# Patient Record
Sex: Female | Born: 1935
Health system: Southern US, Community
[De-identification: ages and names within clinical notes are randomized; demographics above are authoritative.]

## PROBLEM LIST (undated history)

## (undated) DIAGNOSIS — I319 Disease of pericardium, unspecified: Secondary | ICD-10-CM

## (undated) DIAGNOSIS — J90 Pleural effusion, not elsewhere classified: Secondary | ICD-10-CM

## (undated) DIAGNOSIS — D649 Anemia, unspecified: Secondary | ICD-10-CM

## (undated) DIAGNOSIS — K649 Unspecified hemorrhoids: Secondary | ICD-10-CM

## (undated) DIAGNOSIS — R918 Other nonspecific abnormal finding of lung field: Secondary | ICD-10-CM

## (undated) DIAGNOSIS — Z8719 Personal history of other diseases of the digestive system: Secondary | ICD-10-CM

## (undated) DIAGNOSIS — I4891 Unspecified atrial fibrillation: Secondary | ICD-10-CM

## (undated) DIAGNOSIS — C8590 Non-Hodgkin lymphoma, unspecified, unspecified site: Secondary | ICD-10-CM

## (undated) DIAGNOSIS — I499 Cardiac arrhythmia, unspecified: Secondary | ICD-10-CM

## (undated) DIAGNOSIS — K219 Gastro-esophageal reflux disease without esophagitis: Secondary | ICD-10-CM

## (undated) DIAGNOSIS — D839 Common variable immunodeficiency, unspecified: Secondary | ICD-10-CM

## (undated) DIAGNOSIS — I8390 Asymptomatic varicose veins of unspecified lower extremity: Secondary | ICD-10-CM

## (undated) DIAGNOSIS — H269 Unspecified cataract: Secondary | ICD-10-CM

## (undated) DIAGNOSIS — M419 Scoliosis, unspecified: Secondary | ICD-10-CM

## (undated) DIAGNOSIS — M1990 Unspecified osteoarthritis, unspecified site: Secondary | ICD-10-CM

## (undated) DIAGNOSIS — E039 Hypothyroidism, unspecified: Secondary | ICD-10-CM

## (undated) DIAGNOSIS — IMO0002 Reserved for concepts with insufficient information to code with codable children: Secondary | ICD-10-CM

## (undated) DIAGNOSIS — I10 Essential (primary) hypertension: Secondary | ICD-10-CM

## (undated) DIAGNOSIS — Z950 Presence of cardiac pacemaker: Secondary | ICD-10-CM

## (undated) HISTORY — DX: Essential (primary) hypertension: I10

## (undated) HISTORY — PX: TONSILLECTOMY: SUR1361

## (undated) HISTORY — PX: CATARACT EXTRACTION: SUR2

## (undated) HISTORY — DX: Other nonspecific abnormal finding of lung field: R91.8

## (undated) HISTORY — DX: Disease of pericardium, unspecified: I31.9

## (undated) HISTORY — DX: Anemia, unspecified: D64.9

## (undated) HISTORY — DX: Scoliosis, unspecified: M41.9

## (undated) HISTORY — DX: Asymptomatic varicose veins of unspecified lower extremity: I83.90

## (undated) HISTORY — DX: Personal history of other diseases of the digestive system: Z87.19

## (undated) HISTORY — DX: Hypothyroidism, unspecified: E03.9

## (undated) HISTORY — DX: Reserved for concepts with insufficient information to code with codable children: IMO0002

## (undated) HISTORY — DX: Pleural effusion, not elsewhere classified: J90

## (undated) HISTORY — PX: COLONOSCOPY: SHX174

## (undated) HISTORY — DX: Unspecified atrial fibrillation: I48.91

---

## 1998-04-22 ENCOUNTER — Other Ambulatory Visit: Admission: RE | Admit: 1998-04-22 | Discharge: 1998-04-22 | Payer: Self-pay | Admitting: Obstetrics and Gynecology

## 1998-05-09 ENCOUNTER — Other Ambulatory Visit: Admission: RE | Admit: 1998-05-09 | Discharge: 1998-05-09 | Payer: Self-pay | Admitting: Obstetrics and Gynecology

## 1998-06-10 ENCOUNTER — Ambulatory Visit (HOSPITAL_COMMUNITY): Admission: RE | Admit: 1998-06-10 | Discharge: 1998-06-10 | Payer: Self-pay | Admitting: Obstetrics and Gynecology

## 1998-06-13 ENCOUNTER — Ambulatory Visit (HOSPITAL_BASED_OUTPATIENT_CLINIC_OR_DEPARTMENT_OTHER): Admission: RE | Admit: 1998-06-13 | Discharge: 1998-06-13 | Payer: Self-pay

## 1999-06-12 ENCOUNTER — Encounter: Payer: Self-pay | Admitting: Obstetrics and Gynecology

## 1999-06-12 ENCOUNTER — Ambulatory Visit (HOSPITAL_COMMUNITY): Admission: RE | Admit: 1999-06-12 | Discharge: 1999-06-12 | Payer: Self-pay | Admitting: Obstetrics and Gynecology

## 1999-11-04 ENCOUNTER — Other Ambulatory Visit: Admission: RE | Admit: 1999-11-04 | Discharge: 1999-11-04 | Payer: Self-pay | Admitting: Obstetrics and Gynecology

## 1999-12-24 ENCOUNTER — Other Ambulatory Visit: Admission: RE | Admit: 1999-12-24 | Discharge: 1999-12-24 | Payer: Self-pay | Admitting: Obstetrics and Gynecology

## 2001-07-30 HISTORY — PX: BREAST SURGERY: SHX581

## 2001-11-03 ENCOUNTER — Other Ambulatory Visit: Admission: RE | Admit: 2001-11-03 | Discharge: 2001-11-03 | Payer: Self-pay | Admitting: Family Medicine

## 2002-10-10 ENCOUNTER — Other Ambulatory Visit: Admission: RE | Admit: 2002-10-10 | Discharge: 2002-10-10 | Payer: Self-pay | Admitting: Obstetrics and Gynecology

## 2002-11-03 ENCOUNTER — Inpatient Hospital Stay (HOSPITAL_COMMUNITY): Admission: RE | Admit: 2002-11-03 | Discharge: 2002-11-04 | Payer: Self-pay | Admitting: Obstetrics and Gynecology

## 2002-11-03 ENCOUNTER — Encounter (INDEPENDENT_AMBULATORY_CARE_PROVIDER_SITE_OTHER): Payer: Self-pay | Admitting: *Deleted

## 2004-01-28 ENCOUNTER — Encounter (INDEPENDENT_AMBULATORY_CARE_PROVIDER_SITE_OTHER): Payer: Self-pay | Admitting: *Deleted

## 2004-04-08 ENCOUNTER — Ambulatory Visit: Payer: Self-pay | Admitting: Sports Medicine

## 2004-04-29 ENCOUNTER — Ambulatory Visit: Payer: Self-pay | Admitting: Sports Medicine

## 2004-04-29 HISTORY — PX: PARTIAL HYSTERECTOMY: SHX80

## 2005-06-15 ENCOUNTER — Emergency Department (HOSPITAL_COMMUNITY): Admission: EM | Admit: 2005-06-15 | Discharge: 2005-06-16 | Payer: Self-pay | Admitting: Emergency Medicine

## 2005-06-15 ENCOUNTER — Emergency Department (HOSPITAL_COMMUNITY): Admission: EM | Admit: 2005-06-15 | Discharge: 2005-06-15 | Payer: Self-pay | Admitting: Emergency Medicine

## 2005-06-17 ENCOUNTER — Ambulatory Visit: Payer: Self-pay | Admitting: Internal Medicine

## 2005-06-17 ENCOUNTER — Inpatient Hospital Stay (HOSPITAL_COMMUNITY): Admission: EM | Admit: 2005-06-17 | Discharge: 2005-06-23 | Payer: Self-pay | Admitting: Emergency Medicine

## 2005-06-21 ENCOUNTER — Encounter (INDEPENDENT_AMBULATORY_CARE_PROVIDER_SITE_OTHER): Payer: Self-pay | Admitting: Specialist

## 2005-06-23 ENCOUNTER — Encounter (INDEPENDENT_AMBULATORY_CARE_PROVIDER_SITE_OTHER): Payer: Self-pay | Admitting: Cardiology

## 2005-06-26 ENCOUNTER — Inpatient Hospital Stay (HOSPITAL_COMMUNITY): Admission: EM | Admit: 2005-06-26 | Discharge: 2005-06-30 | Payer: Self-pay | Admitting: Emergency Medicine

## 2005-06-27 ENCOUNTER — Encounter (INDEPENDENT_AMBULATORY_CARE_PROVIDER_SITE_OTHER): Payer: Self-pay | Admitting: Specialist

## 2005-06-27 ENCOUNTER — Ambulatory Visit: Payer: Self-pay | Admitting: Critical Care Medicine

## 2005-07-07 ENCOUNTER — Ambulatory Visit: Payer: Self-pay | Admitting: Internal Medicine

## 2005-07-17 ENCOUNTER — Ambulatory Visit: Payer: Self-pay | Admitting: Internal Medicine

## 2005-08-20 ENCOUNTER — Ambulatory Visit (HOSPITAL_BASED_OUTPATIENT_CLINIC_OR_DEPARTMENT_OTHER): Admission: RE | Admit: 2005-08-20 | Discharge: 2005-08-20 | Payer: Self-pay | Admitting: Orthopedic Surgery

## 2005-08-20 ENCOUNTER — Encounter (INDEPENDENT_AMBULATORY_CARE_PROVIDER_SITE_OTHER): Payer: Self-pay | Admitting: Specialist

## 2006-08-27 ENCOUNTER — Encounter (INDEPENDENT_AMBULATORY_CARE_PROVIDER_SITE_OTHER): Payer: Self-pay | Admitting: *Deleted

## 2006-12-23 ENCOUNTER — Encounter: Admission: RE | Admit: 2006-12-23 | Discharge: 2006-12-23 | Payer: Self-pay | Admitting: Gastroenterology

## 2007-01-31 ENCOUNTER — Encounter: Admission: RE | Admit: 2007-01-31 | Discharge: 2007-01-31 | Payer: Self-pay | Admitting: Surgery

## 2007-11-15 LAB — HM COLONOSCOPY: HM Colonoscopy: NORMAL

## 2007-12-19 ENCOUNTER — Encounter: Admission: RE | Admit: 2007-12-19 | Discharge: 2007-12-19 | Payer: Self-pay | Admitting: Sports Medicine

## 2008-04-09 ENCOUNTER — Ambulatory Visit: Payer: Self-pay | Admitting: Vascular Surgery

## 2008-04-12 ENCOUNTER — Ambulatory Visit: Payer: Self-pay | Admitting: Vascular Surgery

## 2008-10-30 ENCOUNTER — Ambulatory Visit: Payer: Self-pay | Admitting: Internal Medicine

## 2008-10-30 ENCOUNTER — Ambulatory Visit: Payer: Self-pay | Admitting: Diagnostic Radiology

## 2008-10-30 ENCOUNTER — Ambulatory Visit (HOSPITAL_BASED_OUTPATIENT_CLINIC_OR_DEPARTMENT_OTHER): Admission: RE | Admit: 2008-10-30 | Discharge: 2008-10-30 | Payer: Self-pay | Admitting: Internal Medicine

## 2008-10-30 DIAGNOSIS — K219 Gastro-esophageal reflux disease without esophagitis: Secondary | ICD-10-CM

## 2008-10-30 DIAGNOSIS — R0609 Other forms of dyspnea: Secondary | ICD-10-CM

## 2008-10-30 DIAGNOSIS — E039 Hypothyroidism, unspecified: Secondary | ICD-10-CM

## 2008-10-30 DIAGNOSIS — E041 Nontoxic single thyroid nodule: Secondary | ICD-10-CM | POA: Insufficient documentation

## 2008-10-30 DIAGNOSIS — R0989 Other specified symptoms and signs involving the circulatory and respiratory systems: Secondary | ICD-10-CM

## 2008-10-30 DIAGNOSIS — F4321 Adjustment disorder with depressed mood: Secondary | ICD-10-CM | POA: Insufficient documentation

## 2008-10-30 LAB — CONVERTED CEMR LAB
ALT: 17 units/L (ref 0–35)
Basophils Relative: 0.9 % (ref 0.0–3.0)
Bilirubin, Direct: 0.1 mg/dL (ref 0.0–0.3)
CO2: 30 meq/L (ref 19–32)
Eosinophils Absolute: 0.4 10*3/uL (ref 0.0–0.7)
Glucose, Bld: 87 mg/dL (ref 70–99)
HCT: 36.1 % (ref 36.0–46.0)
Hemoglobin: 12.7 g/dL (ref 12.0–15.0)
MCHC: 35.2 g/dL (ref 30.0–36.0)
MCV: 92.7 fL (ref 78.0–100.0)
Monocytes Absolute: 0.5 10*3/uL (ref 0.1–1.0)
Neutro Abs: 2.3 10*3/uL (ref 1.4–7.7)
Potassium: 4.6 meq/L (ref 3.5–5.1)
RBC: 3.9 M/uL (ref 3.87–5.11)
Sodium: 144 meq/L (ref 135–145)
Total Protein: 6.7 g/dL (ref 6.0–8.3)

## 2008-11-01 ENCOUNTER — Encounter: Payer: Self-pay | Admitting: Internal Medicine

## 2008-11-02 ENCOUNTER — Ambulatory Visit (HOSPITAL_BASED_OUTPATIENT_CLINIC_OR_DEPARTMENT_OTHER): Admission: RE | Admit: 2008-11-02 | Discharge: 2008-11-02 | Payer: Self-pay | Admitting: Internal Medicine

## 2008-11-02 ENCOUNTER — Ambulatory Visit: Payer: Self-pay | Admitting: Diagnostic Radiology

## 2008-11-05 ENCOUNTER — Telehealth: Payer: Self-pay | Admitting: Internal Medicine

## 2008-11-21 ENCOUNTER — Ambulatory Visit: Payer: Self-pay | Admitting: Internal Medicine

## 2008-11-21 ENCOUNTER — Encounter: Payer: Self-pay | Admitting: Internal Medicine

## 2008-11-21 DIAGNOSIS — B86 Scabies: Secondary | ICD-10-CM | POA: Insufficient documentation

## 2008-11-29 ENCOUNTER — Ambulatory Visit: Payer: Self-pay | Admitting: Critical Care Medicine

## 2008-11-30 ENCOUNTER — Ambulatory Visit: Payer: Self-pay | Admitting: Internal Medicine

## 2008-12-02 ENCOUNTER — Encounter: Payer: Self-pay | Admitting: Critical Care Medicine

## 2008-12-03 ENCOUNTER — Telehealth: Payer: Self-pay | Admitting: Internal Medicine

## 2008-12-06 ENCOUNTER — Telehealth: Payer: Self-pay | Admitting: Internal Medicine

## 2008-12-07 ENCOUNTER — Ambulatory Visit: Payer: Self-pay | Admitting: Radiology

## 2008-12-07 ENCOUNTER — Ambulatory Visit: Payer: Self-pay | Admitting: Internal Medicine

## 2008-12-07 ENCOUNTER — Telehealth: Payer: Self-pay | Admitting: Internal Medicine

## 2008-12-07 ENCOUNTER — Ambulatory Visit (HOSPITAL_BASED_OUTPATIENT_CLINIC_OR_DEPARTMENT_OTHER): Admission: RE | Admit: 2008-12-07 | Discharge: 2008-12-07 | Payer: Self-pay | Admitting: Internal Medicine

## 2008-12-07 DIAGNOSIS — M549 Dorsalgia, unspecified: Secondary | ICD-10-CM | POA: Insufficient documentation

## 2008-12-09 ENCOUNTER — Telehealth: Payer: Self-pay | Admitting: Internal Medicine

## 2008-12-11 ENCOUNTER — Telehealth: Payer: Self-pay | Admitting: Internal Medicine

## 2008-12-21 ENCOUNTER — Telehealth: Payer: Self-pay | Admitting: Internal Medicine

## 2008-12-24 ENCOUNTER — Ambulatory Visit: Payer: Self-pay | Admitting: Internal Medicine

## 2008-12-24 DIAGNOSIS — N390 Urinary tract infection, site not specified: Secondary | ICD-10-CM

## 2008-12-24 LAB — CONVERTED CEMR LAB
Bilirubin Urine: NEGATIVE
Glucose, Urine, Semiquant: NEGATIVE
Urobilinogen, UA: 0.2

## 2008-12-31 ENCOUNTER — Telehealth: Payer: Self-pay | Admitting: Family Medicine

## 2009-01-08 ENCOUNTER — Ambulatory Visit: Payer: Self-pay | Admitting: Internal Medicine

## 2009-01-08 ENCOUNTER — Encounter: Payer: Self-pay | Admitting: Family Medicine

## 2009-01-10 LAB — CONVERTED CEMR LAB: TSH: 2.003 microintl units/mL (ref 0.350–4.500)

## 2009-01-11 ENCOUNTER — Encounter: Payer: Self-pay | Admitting: Critical Care Medicine

## 2009-01-11 ENCOUNTER — Ambulatory Visit (HOSPITAL_COMMUNITY): Admission: RE | Admit: 2009-01-11 | Discharge: 2009-01-11 | Payer: Self-pay | Admitting: Critical Care Medicine

## 2009-01-17 ENCOUNTER — Telehealth: Payer: Self-pay | Admitting: Family Medicine

## 2009-01-17 ENCOUNTER — Ambulatory Visit: Payer: Self-pay | Admitting: Critical Care Medicine

## 2009-01-30 ENCOUNTER — Ambulatory Visit: Payer: Self-pay | Admitting: Internal Medicine

## 2009-01-30 ENCOUNTER — Telehealth: Payer: Self-pay | Admitting: Internal Medicine

## 2009-01-30 LAB — CONVERTED CEMR LAB
Bilirubin Urine: NEGATIVE
Urine Glucose: 100 mg/dL
pH: 5 (ref 5.0–8.0)

## 2009-01-31 ENCOUNTER — Encounter: Payer: Self-pay | Admitting: Internal Medicine

## 2009-02-06 ENCOUNTER — Telehealth: Payer: Self-pay | Admitting: Internal Medicine

## 2009-02-26 ENCOUNTER — Encounter: Payer: Self-pay | Admitting: Internal Medicine

## 2009-02-27 ENCOUNTER — Encounter: Payer: Self-pay | Admitting: Internal Medicine

## 2009-02-27 LAB — HM MAMMOGRAPHY: HM Mammogram: NORMAL

## 2009-03-11 ENCOUNTER — Telehealth: Payer: Self-pay | Admitting: Internal Medicine

## 2009-03-18 ENCOUNTER — Encounter: Payer: Self-pay | Admitting: Internal Medicine

## 2009-03-21 ENCOUNTER — Encounter: Payer: Self-pay | Admitting: Internal Medicine

## 2009-04-05 ENCOUNTER — Ambulatory Visit (HOSPITAL_COMMUNITY): Admission: RE | Admit: 2009-04-05 | Discharge: 2009-04-05 | Payer: Self-pay | Admitting: Obstetrics and Gynecology

## 2009-06-13 ENCOUNTER — Encounter: Payer: Self-pay | Admitting: Internal Medicine

## 2009-06-13 LAB — CONVERTED CEMR LAB

## 2009-06-17 ENCOUNTER — Telehealth: Payer: Self-pay | Admitting: Internal Medicine

## 2009-06-18 ENCOUNTER — Ambulatory Visit (HOSPITAL_BASED_OUTPATIENT_CLINIC_OR_DEPARTMENT_OTHER): Admission: RE | Admit: 2009-06-18 | Discharge: 2009-06-18 | Payer: Self-pay | Admitting: Internal Medicine

## 2009-06-18 ENCOUNTER — Ambulatory Visit: Payer: Self-pay | Admitting: Diagnostic Radiology

## 2009-06-21 ENCOUNTER — Telehealth: Payer: Self-pay | Admitting: Internal Medicine

## 2009-06-24 ENCOUNTER — Encounter (INDEPENDENT_AMBULATORY_CARE_PROVIDER_SITE_OTHER): Payer: Self-pay | Admitting: *Deleted

## 2009-07-02 ENCOUNTER — Encounter: Payer: Self-pay | Admitting: Internal Medicine

## 2009-07-04 ENCOUNTER — Ambulatory Visit: Payer: Self-pay | Admitting: Internal Medicine

## 2009-07-04 ENCOUNTER — Encounter (INDEPENDENT_AMBULATORY_CARE_PROVIDER_SITE_OTHER): Payer: Self-pay | Admitting: *Deleted

## 2009-07-04 DIAGNOSIS — R42 Dizziness and giddiness: Secondary | ICD-10-CM

## 2009-07-04 DIAGNOSIS — R87619 Unspecified abnormal cytological findings in specimens from cervix uteri: Secondary | ICD-10-CM | POA: Insufficient documentation

## 2009-07-04 LAB — CONVERTED CEMR LAB
Cholesterol, target level: 200 mg/dL
LDL Goal: 160 mg/dL

## 2009-07-16 ENCOUNTER — Ambulatory Visit: Payer: Self-pay | Admitting: Cardiology

## 2009-07-16 ENCOUNTER — Encounter: Payer: Self-pay | Admitting: Internal Medicine

## 2009-07-16 ENCOUNTER — Ambulatory Visit (HOSPITAL_COMMUNITY): Admission: RE | Admit: 2009-07-16 | Discharge: 2009-07-16 | Payer: Self-pay | Admitting: Internal Medicine

## 2009-07-16 ENCOUNTER — Ambulatory Visit: Payer: Self-pay

## 2009-08-05 ENCOUNTER — Telehealth: Payer: Self-pay | Admitting: Internal Medicine

## 2009-08-13 ENCOUNTER — Ambulatory Visit: Payer: Self-pay | Admitting: Internal Medicine

## 2009-08-13 DIAGNOSIS — I519 Heart disease, unspecified: Secondary | ICD-10-CM | POA: Insufficient documentation

## 2009-09-18 ENCOUNTER — Ambulatory Visit: Payer: Self-pay | Admitting: Internal Medicine

## 2009-09-18 LAB — CONVERTED CEMR LAB
BUN: 19 mg/dL (ref 6–23)
Calcium: 9.9 mg/dL (ref 8.4–10.5)
Creatinine, Ser: 0.85 mg/dL (ref 0.40–1.20)
Pro B Natriuretic peptide (BNP): 12.3 pg/mL (ref 0.0–100.0)

## 2009-09-20 ENCOUNTER — Ambulatory Visit: Payer: Self-pay | Admitting: Internal Medicine

## 2009-09-20 DIAGNOSIS — M810 Age-related osteoporosis without current pathological fracture: Secondary | ICD-10-CM

## 2009-09-20 DIAGNOSIS — R490 Dysphonia: Secondary | ICD-10-CM

## 2009-11-27 ENCOUNTER — Ambulatory Visit: Admission: RE | Admit: 2009-11-27 | Discharge: 2009-11-27 | Payer: Self-pay | Admitting: Gynecologic Oncology

## 2009-12-16 ENCOUNTER — Ambulatory Visit (HOSPITAL_BASED_OUTPATIENT_CLINIC_OR_DEPARTMENT_OTHER): Admission: RE | Admit: 2009-12-16 | Discharge: 2009-12-16 | Payer: Self-pay | Admitting: Internal Medicine

## 2009-12-16 ENCOUNTER — Ambulatory Visit: Payer: Self-pay | Admitting: Diagnostic Radiology

## 2009-12-17 ENCOUNTER — Telehealth: Payer: Self-pay | Admitting: Internal Medicine

## 2010-02-18 LAB — CONVERTED CEMR LAB: Pap Smear: ABNORMAL

## 2010-02-26 ENCOUNTER — Ambulatory Visit: Admission: RE | Admit: 2010-02-26 | Discharge: 2010-02-26 | Payer: Self-pay | Admitting: Gynecologic Oncology

## 2010-02-26 ENCOUNTER — Other Ambulatory Visit: Admission: RE | Admit: 2010-02-26 | Discharge: 2010-02-26 | Payer: Self-pay | Admitting: Gynecologic Oncology

## 2010-03-25 ENCOUNTER — Ambulatory Visit: Payer: Self-pay | Admitting: Internal Medicine

## 2010-03-25 LAB — CONVERTED CEMR LAB
BUN: 10 mg/dL (ref 6–23)
Calcium: 10.3 mg/dL (ref 8.4–10.5)
Creatinine, Ser: 0.65 mg/dL (ref 0.40–1.20)

## 2010-03-26 ENCOUNTER — Telehealth: Payer: Self-pay | Admitting: Internal Medicine

## 2010-03-27 ENCOUNTER — Telehealth: Payer: Self-pay | Admitting: Internal Medicine

## 2010-03-28 ENCOUNTER — Telehealth: Payer: Self-pay | Admitting: Internal Medicine

## 2010-04-22 ENCOUNTER — Telehealth: Payer: Self-pay | Admitting: Internal Medicine

## 2010-05-08 ENCOUNTER — Ambulatory Visit: Payer: Self-pay | Admitting: Internal Medicine

## 2010-07-19 ENCOUNTER — Encounter: Payer: Self-pay | Admitting: Emergency Medicine

## 2010-07-29 NOTE — Letter (Signed)
Summary: Primary Care Consult Scheduled Letter  [place] [facility]  [address]   [place], [place] [address]   Phone: [phone]  Fax: [phone]      07/04/2009 MRN: [id number]  [name] [address] [place], [place]  [address]    Dear Ms. Starke,      We have scheduled an appointment for you.  At the recommendation of Dr.YOO_, we have scheduled you a   2D Echo,  @ [facility] on January 18 at 2:00pm.  Their address [address] [place], [place] . The office phone number is [phone].  If this appointment day and time is not convenient for you, please feel free to call the office of the doctor you are being referred to at the number listed above and reschedule the appointment.     It is important for you to keep your scheduled appointments. We are here to make sure you are given good patient care. If you have questions or you have made changes to your appointment, please notify us at  [phone], ask for [facility].    Thank you,  Patient Care Coordinator [place] at [facility]

## 2010-07-29 NOTE — Progress Notes (Signed)
Summary: Levothyroxine Refill  Phone Note Refill Request Message from:  Fax from Pharmacy on April 22, 2010 9:03 AM  Refills Requested: Medication #1:  LEVOTHYROXINE SODIUM 100 MCG TABS one by mouth once daily   Dosage confirmed as above?Dosage Confirmed   Brand Name Necessary? No   Supply Requested: 1 month   Last Refilled: 03/17/2010  Method Requested: Electronic Next Appointment Scheduled: NONE Initial call taken by: Roselle Locus,  April 22, 2010 9:03 AM  Follow-up for Phone Call        Rx completed in Dr. Tiajuana Amass Follow-up by: Glendell Docker CMA,  April 22, 2010 9:16 AM    Prescriptions: LEVOTHYROXINE SODIUM 100 MCG TABS (LEVOTHYROXINE SODIUM) one by mouth once daily  #30 x 3   Entered by:   Glendell Docker CMA   Authorized by:   D. Thomos Lemons DO   Signed by:   Glendell Docker CMA on 04/22/2010   Method used:   Electronically to        [facility] [phone]* (retail)       [address]       [place], [place]  [address]       Ph: [phone]       Fax: [phone]   RxID:   [id number]

## 2010-07-29 NOTE — Assessment & Plan Note (Signed)
Summary: discuss abnormal pap smear done at her gyn/hea   Vital Signs:  Patient profile:   76 year old female Weight:      141.50 pounds BMI:     24.38 O2 Sat:      100 % on Room air Temp:     97.6 degrees F oral Pulse rate:   59 / minute Pulse rhythm:   regular Resp:     18 per minute BP sitting:   110 / 80  (right arm) Cuff size:   regular  Vitals Entered By: Glendell Docker CMA (July 04, 2009 10:16 AM)  O2 Flow:  Room air   Primary Care Provider:  D. Thomos Lemons DO  CC:  follow up.  History of Present Illness:  76 y/o female c/o intermittent dizziness.  no chest pain but c/o shortness of breath.  symptoms seem to worse in afternoon.  prev pulm eval reviewed.    seen by GYN - abnormal pap.  It showed LSIL    Allergies: 1)  ! Codeine 2)  ! 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   Lungs:  normal respiratory effort and normal breath sounds.   Heart:  normal rate, regular rhythm, and no gallop.   Neurologic:  cranial nerves II-XII intact.   Psych:  normally interactive and good eye contact.     Impression & Recommendations:  Problem # 1:  DIZZINESS, CHRONIC (ICD-780.4) Pt with chronic dizziness.  I suspect symptoms secondary to eustachian tube dysfunction.    use nasal steroids.  Problem # 2:  DYSPNEA ON  EXERTION (ICD-786.09) chronic dyspnea.  Prev PFTs showed  Mild reduction in DLCO,  Normal spirometry and lung volumes.  we discussed possibility of diastolic dysfunciton.  check 2D Echo  Orders: Echo Referral (Echo)  Problem # 3:  PAP SMEAR, ABNORMAL (ICD-795.00) pap with GYN showed LSIL.  pt advised to  follow GYN recommendations.  Complete Medication List: 1)  Levothyroxine Sodium 75 Mcg Tabs (Levothyroxine sodium) .... One by mouth once daily 2)  Nexium 40 Mg Cpdr (Esomeprazole magnesium) .... By mouth daily. take one half hour before eating. 3)  Tramadol Hcl 50 Mg Tabs (Tramadol hcl) .... One to two tabs by mouth two times a day as needed for back pain 4)  Vivelle-dot 0.05 Mg/24hr Pttw (Estradiol) .... Apply to skin twice weekly 5)  Forteo 600 Mcg/2.25ml Soln (Teriparatide (recombinant)) .... Inject im once daily 6)  Nasonex 50 Mcg/act Susp (Mometasone furoate) .... 2 sprays each nostril once daily  Other Orders: Pneumococcal Vaccine ([address]) Influenza Vaccine MCR ([address]) Admin 1st Vaccine ([address])  Patient Instructions: 1)  Please schedule a follow-up appointment in 2 months. Prescriptions: NASONEX 50 MCG/ACT SUSP (MOMETASONE FUROATE) 2 sprays each nostril once daily  #1 x 2  Entered and Authorized by:   D. Thomos Lemons DO   Signed by:   D. Thomos Lemons DO on 07/04/2009   Method used:   Electronically to        [facility]. [phone]* (retail)       [address]       [place], [place]  [address]       Ph: [phone]       Fax: [phone]   RxID:   [id number]   Current Allergies (reviewed today): ! CODEINE ! SULFA   Immunizations Administered:  Pneumonia Vaccine:    Vaccine Type: Pneumovax (Medicare)    Site: right deltoid    Mfr: Merck    Dose: 0.5 ml    Route: IM    Given by: Glendell Docker CMA    Exp. Date: 06/13/2010    Lot #: [id number]    VIS given: 04/03/2008  Influenza Vaccine # 1:    Vaccine Type: Fluvax MCR    Site:  left deltoid    Mfr: GlaxoSmithKline    Dose: 0.5 ml    Route: IM    Given by: Glendell Docker CMA    Exp. Date: 12/26/2009    Lot #: [id number]    VIS given: 02/05/2009  Flu Vaccine Consent Questions:    Do you have a history of severe allergic reactions to this vaccine? no    Any prior history of allergic reactions to egg and/or gelatin? no    Do you have a sensitivity to the preservative Thimersol? no    Do you have a past history of Guillan-Barre Syndrome? no    Do you currently have an acute febrile illness? no    Have you ever had a severe reaction to latex? no    Vaccine information given and explained to patient? yes    Are you currently pregnant? no

## 2010-07-29 NOTE — Progress Notes (Signed)
Summary: Omeprazole Refill  Phone Note From Pharmacy   Caller: [facility]  [place]. #[phone]* Reason for Call: Patient requests substitution Summary of Call: fax recieved from pharmacy stating patient is requesting a generic for Nexium. Per Dr Artist Pais okay, patient will be switched to generic Omeprazole 40. Initial call taken by: Glendell Docker CMA,  March 27, 2010 1:39 PM  Follow-up for Phone Call        Phone call completed, Prescription resent Follow-up by: Glendell Docker CMA,  March 27, 2010 1:41 PM    New/Updated Medications: OMEPRAZOLE 40 MG CPDR (OMEPRAZOLE) Take 1 capsule by mouth once a day 30 mintues before eating Prescriptions: OMEPRAZOLE 40 MG CPDR (OMEPRAZOLE) Take 1 capsule by mouth once a day 30 mintues before eating  #90 x 1   Entered by:   Glendell Docker CMA   Authorized by:   D. Thomos Lemons DO   Signed by:   Glendell Docker CMA on 03/27/2010   Method used:   Electronically to        [facility]. [phone]* (retail)       [address]       [place], [place]  [address]       Ph: [phone]       Fax: [phone]   RxID:   [id number]

## 2010-07-29 NOTE — Progress Notes (Signed)
Summary: Test Results  Phone Note Outgoing Call   Summary of Call: call pt - thyroid u/s shows stable appearance of right thyroid nodules.  I suggest we repeat U/S in 6 months Initial call taken by: D. Thomos Lemons DO,  December 17, 2009 5:26 PM  Follow-up for Phone Call        patient advised per Dr Artist Pais instructions Follow-up by: Glendell Docker CMA,  December 18, 2009 8:43 AM

## 2010-07-29 NOTE — Miscellaneous (Signed)
Summary: Preventative Screening   Clinical Lists Changes  Observations: Added new observation of PAP SMEAR: LSIL (06/13/2009 13:48) Added new observation of MAMMOGRAM: normal (02/27/2009 13:48)      Preventive Care Screening  Pap Smear:    Date:  06/13/2009    Results:  LSIL  Mammogram:    Date:  02/27/2009    Results:  normal

## 2010-07-29 NOTE — Assessment & Plan Note (Signed)
Summary: 1 MON F/U  /HEA   Vital Signs:  Patient profile:   76 year old female Height:      64 inches Weight:      137.50 pounds BMI:     23.69 O2 Sat:      98 % on Room air Temp:     98.1 degrees F oral Pulse rate:   64 / minute Pulse rhythm:   regular Resp:     16 per minute BP sitting:   120 / 82  (left arm) Cuff size:   regular  Vitals Entered By: Glendell Docker CMA (September 20, 2009 4:00 PM)  O2 Flow:  Room air CC: Rm 3- Follow up on Blood Pressure Comments no concerns high 149/93 low 111/67- tolerating Diovan ok 141/84  3p, Refill on thyroid medication   Primary Care Provider:  DThomos Lemons DO  CC:  Rm 3- Follow up on Blood Pressure.  History of Present Illness: 76 y/o for follow up re:  dyspnea she is tolerating diovan.  no side effects she is tracking BP at home - trending lower no dizziness  she c/o insomnia trouble falling asleep at night no caffeine use we discussed sleep hygiene (she exercises occ before bedtime) she also drinks wine occ  she c/o hoarsness occ feels lump in her throat she read throat cancer can be assoc with cervical cancer  osteoporosis - GYN put her own forteo.  no side effects but cost prohibitive  Allergies: 1)  ! Codeine 2)  ! 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   Eyes:  pupils equal, pupils round, and pupils reactive to light.   Mouth:  pharynx pink and moist.   Neck:  supple and no masses.   Lungs:  normal respiratory effort, normal breath sounds, no crackles, and no wheezes.   Heart:  normal rate, regular rhythm, no murmur, and no gallop.   Extremities:  No lower extremity edema  Neurologic:  cranial nerves II-XII intact and gait normal.   Psych:  normally interactive, good eye contact, not anxious appearing, and not depressed appearing.     Impression & Recommendations:  Problem # 1:  DIASTOLIC DYSFUNCTION (ICD-429.9) Assessment Improved Less dyspneic since starting diovan.  She has also lowered salt intake BMET and BNP normal - reviewed with pt  Problem # 2:  HOARSENESS (ICD-784.42)  Pt c/o intermittent hoarseness.  She also complains of lump sensation in her throat.  She is worried about risk of throat cancer.  She had recent abnormal PAP and read throat cancer can be assoc with cervical cancer refer to ENT for further eval  Orders:  ENT Referral (ENT)  Problem # 3:  OSTEOPOROSIS (ICD-733.00) Pt received ReClast 22yrs ago.  no improvement in bone density as per GYN.  she is now on forteo but concerned by efficacy but mainly cost.  we discussed possilbly use Prolia.  She will research and we will discuss at next OV Her updated medication list for this problem includes:    Forteo 600 Mcg/2.18ml Soln (Teriparatide (recombinant)) ..... Inject im once daily  Complete Medication List: 1)  Levothyroxine Sodium 75 Mcg Tabs (Levothyroxine sodium) .... One by mouth once daily 2)  Nexium 40 Mg Cpdr (Esomeprazole magnesium) .... By mouth daily. take one half hour before eating. 3)  Forteo 600 Mcg/2.9ml Soln (Teriparatide (recombinant)) .... Inject im once daily 4)  Diovan 80 Mg Tabs (Valsartan) .... One by mouth once daily 5)  Zolpidem Tartrate 5 Mg Tabs (Zolpidem  tartrate) .... 1/2 to one tab by mouth at bedtime prn  Patient Instructions: 1)  Please schedule a follow-up appointment in 6 months. 2)  BMP prior to visit, ICD-9: 401.9 3)  Please return for lab work one (1) week before your next appointment.  Prescriptions: NEXIUM 40 MG  CPDR (ESOMEPRAZOLE MAGNESIUM) By mouth daily. Take one half hour before eating.  #30 x 5   Entered and Authorized by:   D. Thomos Lemons DO   Signed by:   D. Thomos Lemons DO on 09/20/2009   Method used:   Electronically to        [facility]. [phone]* (retail)       [address]       [place], [place]  [address]       Ph: [phone]       Fax: [phone]   RxID:   [id number] LEVOTHYROXINE SODIUM 75 MCG TABS (LEVOTHYROXINE SODIUM) one by mouth once daily  #30 x 5   Entered and Authorized by:   D. Thomos Lemons DO   Signed by:   D. Thomos Lemons DO on 09/20/2009   Method used:   Electronically to        [facility]. [phone]* (retail)       [address]       [place], [place]  [address]       Ph: [phone]       Fax: [phone]   RxID:   [id number] DIOVAN 80 MG TABS (VALSARTAN) one by mouth once daily  #30 x 5   Entered and Authorized by:   D. Thomos Lemons DO   Signed by:   D. Thomos Lemons DO on 09/20/2009   Method used:   Electronically to        [facility] [phone]* (retail)       [address]       [place], [place]  [address]       Ph: [phone]       Fax: [phone]   RxID:   [id number] ZOLPIDEM TARTRATE 5 MG TABS (ZOLPIDEM TARTRATE) 1/2 to one tab by mouth at bedtime prn  #30 x 0   Entered and Authorized by:   D. Thomos Lemons DO   Signed by:   D. Thomos Lemons DO on 09/20/2009   Method used:   Print then Give to Patient   RxID:   [id number]   Current Allergies (reviewed today): ! CODEINE !  SULFA

## 2010-07-29 NOTE — Progress Notes (Signed)
Summary: Prolia Status  Phone Note Outgoing Call   Summary of Call: [employer] verification form has been faxed to [phone]. awaiting status for patient responsibility for coverage Initial call taken by: Glendell Docker CMA,  March 28, 2010 4:27 PM  Follow-up for Phone Call        voice message from [place] with [facility] [employer] [phone], she stated form was incomplete. Diagnosis code was omitted. Her message requested the form be refaxed with diagnosis code.  Form has been corrected and re-faxed. Awaiting status Follow-up by: Glendell Docker CMA,  April 01, 2010 9:17 AM  Additional Follow-up for Phone Call Additional follow up Details #1::        Summary of Benefits recieved from [facility] patient out of pocket cost. Spoke with Tammy with customer service patient has a deductible of $3250. She will be responsible for 20%  of the billed amount for the office visit, if office orders the medication.   [facility] at [phone] is covered through the pharmacy benefit with a $65 co-pay for retail, or a $65 copay for mail order. The coverage gap does not apply. At $4551, the patient will have a 5% co-insurance. Adminstration is subject to a 20% co-insurance and a $3250 of pocket max. ($173.33 met). Once met, coverage increases to 100% of the contracted rate. Deductible does not apply. If an office visit is billed,a $30 co-pay will apple. No referral required. Additional Follow-up by: Glendell Docker CMA,  April 04, 2010 12:03 PM    Additional Follow-up for Phone Call Additional follow up Details #2::    Spoke with [facility] for prior authorization on Prolia.  [phone].  cost of the medication to patient may be 50 % . She states she is unable to verify the information at present a rx has to be sent and rejected to intiate overide. Once rejection has been received from pharmacy they can proceed with authorization of coverage Glendell Docker CMA,   April 04, 2010 2:40 PM  Prior authorization initiated for patient. Spoke with CSr Pattty and requested to speak with a  Merchandiser, retail- transferred [employer]. He states medication has been approved but will need to be ran again by the pharmacy for  a 90 day supply instead of 180 day supply, they only supply meds for a 30, 60 or 90 day. Call was placed to [facility]. Pharmacist states the cost to the patient will be $413.44, Rx on hold until patient is aware and a decision is made.   Follow-up by: Glendell Docker CMA,  April 04, 2010 5:06 PM  Additional Follow-up for Phone Call Additional follow up Details #3:: Details for Additional Follow-up Action Taken: call placed to patient she was informed of the copay $413.44 for the shot Prolia. She was informed that I would contact the pharmacy  to proceed with placing the order for the medication.  She was advised that it may take the pharmacy a couple of days to obtain the medication, and to check with them in Wednesday or Thursday. Once she has picked up the rx, she was advised to call to schedule nurse visit for injection of the medication. Patient has verbalized understanding and agrees as instructed.  Call placed  to pharmacist at [facility], she was advised that patient would like to proceed with ordering the Prolia. Pharmacist verified cost to patient to be $413.44, and will place the order  Additional Follow-up by: Glendell Docker CMA,  April 07, 2010 10:47 AM  New/Updated Medications: PROLIA 60 MG/ML SOLN (DENOSUMAB)  PROLIA 60 MG/ML SOLN (DENOSUMAB) inject subcutaneously every 6 months Prescriptions: PROLIA 60 MG/ML SOLN (DENOSUMAB) inject subcutaneously every 6 months  #1 x 1   Entered by:   Glendell Docker CMA   Authorized by:   D. Thomos Lemons DO   Signed by:   Glendell Docker CMA on 04/04/2010   Method used:   Electronically to        [facility]. [phone]* (retail)       [address]       [place], [place]   [address]       Ph: [phone]       Fax: [phone]   RxID:   [id number]

## 2010-07-29 NOTE — Progress Notes (Signed)
Summary: copy of paperwork   Phone Note Call from Patient Call back at Home Phone [phone]   Caller: pt live Call For: yoo  Summary of Call: she would like a copy of the paperwork sent from or gynecologist  Initial call taken by: Roselle Locus,  August 05, 2009 1:23 PM  Follow-up for Phone Call        call returned to patient she was advised to contact her gynecologist for paper work.  Patient veralized understanding and agrees to contact gynecologist for information. Follow-up by: Glendell Docker CMA,  August 05, 2009 1:51 PM

## 2010-07-29 NOTE — Progress Notes (Signed)
Summary: would like results of echo   Phone Note Call from Patient Call back at Home Phone [phone]   Caller: pt live Call For: yoo  Summary of Call: would like results of echo [phone] Initial call taken by: Roselle Locus,  August 05, 2009 1:21 PM  Follow-up for Phone Call        please obtain her cell phone # for call back Follow-up by: D. Thomos Lemons DO,  August 05, 2009 2:24 PM  Additional Follow-up for Phone Call Additional follow up Details #1::        I made pt. a appointment for next week & reached her at  [phone] when I made her a appt. Michaelle Copas  August 05, 2009 2:48 PM'     Additional Follow-up for Phone Call Additional follow up Details #2::    discussed 2D echo.  see is still concerned about PAP findings.  prev PAP showed low-grade squamous intraepithelial lesion.  she would like second opinion.  she was prescribed fluorouracil .  I suggest [facility]. Follow-up by: D. Thomos Lemons DO,  August 05, 2009 4:37 PM  Additional Follow-up for Phone Call Additional follow up Details #3:: Details for Additional Follow-up Action Taken: Appt  [facility]   Dr Renaldo Fiddler  March 16  Patient notified and confirmed   Additional Follow-up by: Darral Dash,  August 07, 2009 8:10 AM

## 2010-07-29 NOTE — Progress Notes (Signed)
Summary: Bone Density  ---- Converted from flag ---- ---- 03/25/2010 12:00 PM, D. Thomos Lemons DO wrote: call [facility] re:  obtain copy of bone density scan ------------------------------  Phone Note Outgoing Call   Call placed by: Glendell Docker CMA,  March 27, 2010 4:57 PM Call placed to: Patient Summary of Call: call was placed to patient earlier in the day regarding he bone density. She was aske which [facility] office did she have her test done. She states she could not recall at the moment, she would do some research and call back with the requested information Initial call taken by: Glendell Docker CMA,  March 27, 2010 4:59 PM

## 2010-07-29 NOTE — Assessment & Plan Note (Signed)
Summary: f/u to discuss 2D echo - jr   Vital Signs:  Patient profile:   76 year old female Weight:      139.75 pounds BMI:     24.07 O2 Sat:      99 % on Room air Temp:     97.9 degrees F oral Pulse rate:   66 / minute Pulse rhythm:   regular Resp:     16 per minute BP sitting:   126 / 80  (left arm) Cuff size:   regular  Vitals Entered By: Glendell Docker CMA (August 13, 2009 3:34 PM)  O2 Flow:  Room air  Primary Care Provider:  D. Thomos Lemons DO  CC:  Follow up test results.  History of Present Illness: 76 y/o white female for follow up.   still gets dyspneic there is long hallway at work -  when she gets to end she feels like she needs to sit down. no chest pain prev PFTs  - no obstruction.  normal lung volumes,  diffusion mildly decreased Exercise cardiopulm testing did not suggest pulm cause for dysnpnea.  2D Echo - Study Conclusions            - Left ventricle: The cavity size was normal. There was mild focal       basal hypertrophy of the septum. Systolic function was normal. The       estimated ejection fraction was in the range of 55% to 60%. Wall       motion was normal; there were no regional wall motion       abnormalities. Doppler parameters are consistent with abnormal       left ventricular relaxation (grade 1 diastolic dysfunction).     - Mitral valve: Mild prolapse, involving the anterior leaflet and       the posterior leaflet. Mild regurgitation.     - Left atrium: The atrium was mildly dilated.     - Atrial septum: There was an atrial septal aneurysm.     - Pulmonary arteries: Systolic pressure was mildly increased. PA       peak pressure: 35mm Hg (S).     - Pericardium, extracardiac: A trivial pericardial effusion was       identified posterior to the heart.       Allergies: 1)  ! Codeine 2)  ! 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   Lungs:  normal respiratory effort, normal breath sounds, no crackles, and no wheezes.   Heart:  normal rate, regular rhythm, no murmur, and no gallop.   Extremities:  No lower extremity edema    Impression & Recommendations:  Problem # 1:  DIASTOLIC DYSFUNCTION (ICD-429.9) Cardiopulmonary exercise testing does not suggest pulmonary cause.  PFTs  were unremarkale.  2D Echo shows grade one diastolic dysfunction.  start diovan.    Complete Medication List: 1)  Levothyroxine Sodium 75 Mcg Tabs (Levothyroxine sodium) .... One by mouth once daily 2)  Nexium 40 Mg Cpdr (Esomeprazole magnesium) .... By mouth daily. take one half hour before eating. 3)  Forteo 600 Mcg/2.29ml Soln (Teriparatide (recombinant)) .... Inject im once daily 4)  Diovan 80 Mg Tabs (Valsartan) .... One by mouth once daily  Patient Instructions: 1)  Please schedule a follow-up appointment in 1 month. 2)  BMP prior to visit, ICD-9: 401.9 3)  BNP:  401.9 4)  Please return for lab work one (1) week before your next appointment.  Prescriptions: DIOVAN 80 MG TABS (VALSARTAN) one by mouth once daily  #30 x 2   Entered and Authorized by:   D. Thomos Lemons DO   Signed by:   D. Thomos Lemons DO on 08/13/2009   Method used:   Electronically to        [facility] [phone]* (retail)       [address]       [place], [place]  [address]        Ph: [phone]       Fax: [phone]   RxID:   [id number]   Current Allergies (reviewed today): ! CODEINE ! SULFA

## 2010-07-29 NOTE — Assessment & Plan Note (Signed)
Summary: 6 month fu/dt   Vital Signs:  Patient profile:   76 year old female Height:      64 inches Weight:      142 pounds BMI:     24.46 O2 Sat:      100 % on Room air Temp:     98.0 degrees F oral Pulse rate:   58 / minute Pulse rhythm:   regular Resp:     16 per minute BP sitting:   124 / 90  (left arm) Cuff size:   regular  Vitals Entered By: Glendell Docker CMA (March 25, 2010 11:01 AM)  O2 Flow:  Room air CC: 6 Month Follow up Is Patient Diabetic? No Pain Assessment Patient in pain? no      Comments abnormal pap in 2011  using a "cancer cream ", follow up in 6 months, request generics on all medication,alternative for Forteo,  stopped sleeping pill to avoid dependence   Primary Care Provider:  D. Thomos Lemons DO  CC:  6 Month Follow up.  History of Present Illness: 76 y/o white female with hx of osteoporosis for f/u stopped Forteo  -  used x 4 months but stopped due to muscle cramps ( feet and legs) last used forteo 3 months ago   interval hx  had bad nose bleed 3 weeks ago stopped on its own  cervical dysplasia pt using topical agent  Preventive Screening-Counseling & Management  Alcohol-Tobacco     Smoking Status: never  Allergies: 1)  ! Codeine 2)  ! 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   Lungs:  normal respiratory effort, normal breath sounds, no crackles, and no wheezes.   Heart:  normal rate, regular rhythm, no murmur, and no gallop.   Neurologic:  cranial nerves II-XII intact and gait normal.     Impression & Recommendations:  Problem # 1:  OSTEOPOROSIS (ICD-733.00) Assessment Deteriorated Pt is poor response to ReClast and stopped forteo due to significant muscle cramps trial of prolia The following medications were removed from the medication list:    Forteo 600 Mcg/2.22ml Soln (Teriparatide (recombinant)) ..... Inject im once daily  Orders: Misc. Referral (Misc. Ref)  Problem # 2:  HYPOTHYROIDISM (ICD-244.9)  Her updated medication list for this problem includes:    Levothyroxine Sodium 100 Mcg Tabs (Levothyroxine sodium) ..... One by mouth once daily  Orders: T-TSH (81191-47829)  Complete Medication List: 1)  Levothyroxine Sodium 100 Mcg Tabs (Levothyroxine sodium) .... One by mouth once daily 2)  Losartan Potassium 50 Mg Tabs (Losartan potassium) .... One by mouth once daily 3)  Amitriptyline Hcl 10 Mg Tabs (Amitriptyline hcl) .... 1/2 to one tab by mouth at bedtime as needed  4)  Zostavax 16109 Unt/0.31ml Solr (Zoster vaccine live) .... Administer vaccine x 1 5)  Omeprazole 40 Mg Cpdr (Omeprazole) .... Take 1 capsule by mouth once a day 30 mintues before eating  Other Orders: Influenza Vaccine MCR (60454) Tdap => 17yrs IM (09811) Admin 1st Vaccine (91478) Flu Vaccine 3yrs + MEDICARE PATIENTS (G9562) T-Basic Metabolic Panel (13086-57846)  Patient Instructions: 1)  Please schedule a follow-up appointment in 3 months. Prescriptions: ZOSTAVAX 96295 UNT/0.65ML SOLR (ZOSTER VACCINE LIVE) administer vaccine x 1  #1 x 0   Entered and Authorized by:   D. Thomos Lemons DO   Signed by:   D. Thomos Lemons DO on 03/25/2010   Method used:   Print then Give to Patient   RxID:   [id number] AMITRIPTYLINE HCL 10 MG TABS (AMITRIPTYLINE HCL) 1/2 to  one tab by mouth at bedtime as needed  #30 x 1   Entered and Authorized by:   D. Thomos Lemons DO   Signed by:   D. Thomos Lemons DO on 03/25/2010   Method used:   Electronically to        [facility] [phone]* (retail)       [address]       [place], [place]  [address]       Ph: [phone]       Fax: [phone]   RxID:   [id number]    Immunizations Administered:  Influenza Vaccine # 1:    Vaccine Type: Fluvax MCR    Site: right deltoid    Mfr: GlaxoSmithKline    Dose: 0.5 ml    Route: IM    Given by: Glendell Docker CMA    Exp. Date: 12/27/2010    Lot #: [id number]    VIS given: 01/21/10 version given March 25, 2010.  Tetanus Vaccine:    Vaccine Type: Tdap    Site: left deltoid    Mfr: GlaxoSmithKline    Dose: 0.5 ml    Route: IM    Given by: Glendell Docker CMA    Exp. Date: 04/17/2012    Lot #: [id number]    VIS given: 05/16/08 version given March 25, 2010.  Flu Vaccine Consent Questions:    Do you have a history of severe allergic reactions to this vaccine? no    Any prior history of allergic reactions to egg and/or gelatin? no    Do you have a sensitivity to the preservative Thimersol? no    Do you have a past history of Guillan-Barre Syndrome? no    Do you currently have an acute febrile illness? no    Have you ever had a severe reaction to latex? no    Vaccine information given and explained to patient? yes    Are you currently pregnant? no    Preventive Care Screening  Pap Smear:    Date:  02/18/2010    Results:  abnormal    Current Allergies (reviewed today): ! CODEINE ! SULFA

## 2010-07-29 NOTE — Progress Notes (Signed)
Summary: Medication Refill  Phone Note Outgoing Call   Summary of Call: call pt - blood test shows pt needs higher dose of thyroid medication.  see rx. arrange TSH in 2 months 244.9 Initial call taken by: D. Thomos Lemons DO,  March 26, 2010 8:13 AM  Follow-up for Phone Call        call placed to patient at [phone], she has been advised per Dr Artist Pais instructions. She would like to know if she could get a rx for generic Omeprazole, and her blood pressure medication. She states there were no prescriptions at the pharmacy for her Follow-up by: Glendell Docker CMA,  March 26, 2010 9:56 AM  Additional Follow-up for Phone Call Additional follow up Details #1::        call returned to patient she has been advised rx sent to pharmacy Additional Follow-up by: Glendell Docker CMA,  March 26, 2010 11:15 AM    New/Updated Medications: LEVOTHYROXINE SODIUM 100 MCG TABS (LEVOTHYROXINE SODIUM) one by mouth once daily NEXIUM 40 MG CPDR (ESOMEPRAZOLE MAGNESIUM) one by mouth once daily Prescriptions: NEXIUM 40 MG CPDR (ESOMEPRAZOLE MAGNESIUM) one by mouth once daily  #90 x 1   Entered and Authorized by:   D. Thomos Lemons DO   Signed by:   D. Thomos Lemons DO on 03/26/2010   Method used:   Electronically to        [facility] [phone]* (retail)       [address]       [place], [place]  [address]       Ph: [phone]       Fax: [phone]   RxID:   [id number] LOSARTAN POTASSIUM 50 MG TABS (LOSARTAN POTASSIUM) one by mouth once daily  #90 x 1   Entered and Authorized by:   D. Thomos Lemons DO   Signed by:   D. Thomos Lemons DO on 03/26/2010   Method used:   Electronically to        [facility] [phone]* (retail)       [address]       [place], [place]  [address]       Ph: [phone]       Fax: [phone]   RxID:   [id number] LEVOTHYROXINE SODIUM 100 MCG TABS (LEVOTHYROXINE SODIUM) one by mouth once daily  #30 x 3  Entered and Authorized by:   D. Thomos Lemons DO   Signed by:   D. Thomos Lemons DO on 03/26/2010   Method used:   Electronically to        [facility] [phone]* (retail)       [address]       [place], [place]  [address]       Ph: [phone]       Fax: [phone]   RxID:   [id number]

## 2010-08-05 LAB — HM PAP SMEAR: HM Pap smear: NORMAL

## 2010-08-27 ENCOUNTER — Other Ambulatory Visit: Payer: Self-pay | Admitting: Gynecologic Oncology

## 2010-08-27 ENCOUNTER — Ambulatory Visit: Payer: No Typology Code available for payment source | Attending: Gynecologic Oncology | Admitting: Gynecologic Oncology

## 2010-08-27 ENCOUNTER — Other Ambulatory Visit (HOSPITAL_COMMUNITY)
Admission: RE | Admit: 2010-08-27 | Discharge: 2010-08-27 | Disposition: A | Discharge status: Home / Self Care | Payer: No Typology Code available for payment source | Source: Ambulatory Visit | Attending: Gynecologic Oncology | Admitting: Gynecologic Oncology

## 2010-08-27 DIAGNOSIS — N893 Dysplasia of vagina, unspecified: Secondary | ICD-10-CM | POA: Insufficient documentation

## 2010-08-27 DIAGNOSIS — Z8540 Personal history of malignant neoplasm of unspecified female genital organ: Secondary | ICD-10-CM | POA: Insufficient documentation

## 2010-08-27 DIAGNOSIS — Z79899 Other long term (current) drug therapy: Secondary | ICD-10-CM | POA: Insufficient documentation

## 2010-08-28 NOTE — Consult Note (Signed)
NAMEGRETHEL, ZENK             ACCOUNT NO.:  [id number]  MEDICAL RECORD NO.:  [id number]          PATIENT TYPE:  OUT  LOCATION:  GYN                          FACILITY:  [facility]  PHYSICIAN:  Rodell Marrs A. Duard Brady, MD    DATE OF BIRTH:  [birth year]  DATE OF CONSULTATION:  08/27/2010 DATE OF DISCHARGE:                                  CONSULTATION   HISTORY OF PRESENT ILLNESS:  Ms. Bennie is a 76 year old with long history of abnormal Pap smears.  In October 2010, she underwent CO2 laser ablation.  Pap smears again became abnormal, and she was treated with Acidex which she did 2 series to February 2011 and then was placed on Premarin cream.  She is seen by Dr. Vincente Poli, and Pap at that time showed low grade dysplasia.  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  I saw her in June 2011.  Exam at that time revealed the biopsy site in the right upper quadrant.  There are no other visible lesions.  We encouraged her to continue using her Premarin cream, and Pap smear in August 2011 came back as normal.  She comes in today for followup.  She is overall doing quite well and really denies any complaints.  She denies any vaginal bleeding, change in bowel or bladder habits, or any nausea, vomiting, fevers, chills, or unintentional weight loss or weight gain.  MEDICATIONS:  Medication list was reviewed and is unchanged including: 1. Levothyroxine. 2. Nexium. 3. Tramadol. 4. Vivelle-Dot. 5. Forteo. 6. Nasonex. 7. Vaginal Premarin cream.  ALLERGIES:  Include SULFA which causes rash and itching.  PHYSICAL EXAMINATION:  VITAL SIGNS:  Height 5 feet 5 inches, weight 145 pounds, BMI is 24, blood pressure 134/80, pulse 64, respirations 16, and temperature 98. GENERAL:  Well-nourished, well-developed female who appears younger than stated age, in no acute distress. PELVIC:  External genitalia within normal limits, though atrophic.  The vagina is markedly atrophic.  The vaginal cuff is visualized.  There  is evidence of Premarin vaginal cream.  There are no gross visible lesions. ThinPrep Pap was submitted without difficulty.  Bimanual examination reveals no masses or nodularity.  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  PLAN: 1. We will follow up on the results for Pap smear from today.  We will     encourage her to continue using her     Premarin cream.  She was given refills for that. 2. She is due for mammogram.  She was encouraged to call her radiology     suite to have that scheduled. 3. If her Pap smear is normal, she will be released from our clinic     and can follow up with Dr. Vincente Poli in 1 year.     Oshae Simmering A. Duard Brady, MD     PAG/MEDQ  D:  08/27/2010  T:  08/27/2010  Job:  [id number]  cc:   Marcelino Duster L. Vincente Poli, M.D. Fax: [phone]  Barbette Hair. Artist Pais, DO [address] [place], [place] [address]  Telford Nab, R.N. [address] [place], [place] [address]  Electronically Signed  by Cleda Mccreedy MD on 08/28/2010 02:08:34 PM

## 2010-09-25 ENCOUNTER — Telehealth: Payer: Self-pay | Admitting: Internal Medicine

## 2010-09-25 DIAGNOSIS — I519 Heart disease, unspecified: Secondary | ICD-10-CM

## 2010-09-25 DIAGNOSIS — E039 Hypothyroidism, unspecified: Secondary | ICD-10-CM

## 2010-09-25 NOTE — Telephone Encounter (Signed)
Refill- levothyroxine tablet. Take one tablet by mouth once daily. Qty 30. Last fill 2.17.12  Refill- losartan potassium 50mg  tab. Take one tablet by mouth once daily. Qty 90. Last fill 1.25.12

## 2010-09-26 MED ORDER — LEVOTHYROXINE SODIUM 100 MCG PO TABS: 100.0000 ug | ORAL_TABLET | Freq: Every day | ORAL | Status: DC

## 2010-09-26 MED ORDER — LOSARTAN POTASSIUM 50 MG PO TABS: 50.0000 mg | ORAL_TABLET | Freq: Every day | ORAL | Status: DC

## 2010-09-26 NOTE — Telephone Encounter (Signed)
Call placed to patient at [phone], no answer. A detailed voice message was left informing patient of refill to pharmacy and follow up appointment with Dr Artist Pais along with fasting labs

## 2010-09-26 NOTE — Telephone Encounter (Signed)
Ok to refill x 1 . Needs labs BMET, TSH, FLP, LFTs, CRP - 401.9 before OV within 1 month

## 2010-09-26 NOTE — Telephone Encounter (Signed)
Addended by: Glendell Docker on: 09/26/2010 02:53 PM   Modules accepted: Orders

## 2010-10-02 LAB — BASIC METABOLIC PANEL
CO2: 25 mEq/L (ref 19–32)
Calcium: 9.5 mg/dL (ref 8.4–10.5)
Creatinine, Ser: 0.74 mg/dL (ref 0.4–1.2)
GFR calc Af Amer: >60 mL/min (ref >60)
GFR calc non Af Amer: >60 mL/min (ref >60)
Glucose, Bld: 87 mg/dL (ref 70–99)

## 2010-10-02 LAB — CBC
MCHC: 33.6 g/dL (ref 30.0–36.0)
RDW: 13.2 % (ref 11.5–15.5)

## 2010-10-14 ENCOUNTER — Telehealth: Payer: Self-pay | Admitting: Internal Medicine

## 2010-10-14 NOTE — Telephone Encounter (Signed)
Patient made follow up appt on 10-27-10. Pt wants to know if she should come in a week early for fasting labs or if she can do labwork same day?

## 2010-10-15 ENCOUNTER — Encounter: Payer: Self-pay | Admitting: Internal Medicine

## 2010-10-15 NOTE — Telephone Encounter (Signed)
BMET, FLP, CRP - 401.9 TSH:  244.9

## 2010-10-16 ENCOUNTER — Encounter: Payer: Self-pay | Admitting: Internal Medicine

## 2010-10-16 ENCOUNTER — Ambulatory Visit (INDEPENDENT_AMBULATORY_CARE_PROVIDER_SITE_OTHER): Payer: No Typology Code available for payment source | Admitting: Internal Medicine

## 2010-10-16 DIAGNOSIS — M545 Low back pain, unspecified: Secondary | ICD-10-CM

## 2010-10-16 DIAGNOSIS — E039 Hypothyroidism, unspecified: Secondary | ICD-10-CM

## 2010-10-16 LAB — LIPID PANEL: Cholesterol: 211 mg/dL — ABNORMAL HIGH (ref 0–200)

## 2010-10-16 LAB — TSH: TSH: 3.169 u[IU]/mL (ref 0.350–4.500)

## 2010-10-16 MED ORDER — TRAMADOL HCL 50 MG PO TABS: ORAL_TABLET | ORAL | Status: DC

## 2010-10-16 NOTE — Progress Notes (Signed)
Subjective:    Patient ID: [name], female    DOB: Dec 30, 1934, 76 y.o.   MRN: [id number]  HPI  76 y/o c/o nocturnal right sided pain.  Symptoms can severe.  Worse with changing position and getting.  Getting worse x 2 weeks. No assoc symptoms Described as sharp stabbing sensations.  Last for few seconds.  She has hx of significant scoliosis  Denies hip pain   Review of Systems  No fever or chills.  Negative for urinary complaints Past Medical History  Diagnosis Date  . Thyroid disease     hypothyroid  . Anemia   . Osteoporosis   . Pleural effusion, bilateral     history of  . Pulmonary nodules     history of  . History of pancreatitis   . Cystocele     history with rectocele  . Scoliosis   . Varicose vein   . Routine general medical examination at a health care facility     History   Social History  . Marital Status: Widowed    Spouse Name: N/A    Number of Children: 3  . Years of Education: N/A   Occupational History  . self employed    Social History Main Topics  . Smoking status: Never Smoker   . Smokeless tobacco: Not on file  . Alcohol Use: Not on file  . Drug Use: Not on file  . Sexually Active: Not on file   Other Topics Concern  . Not on file   Social History Narrative  . No narrative on file    Past Surgical History  Procedure Date  . Breast surgery 07/30/01    biopsy x 3  . Abdominal hysterectomy 04/29/04    partial    Family History  Problem Relation Age of Onset  . Heart disease Daughter     congenital "whole in her heart"  . Stroke Other   . Coronary artery disease Other   . Lymphoma Daughter     nonhodgkins    Allergies  Allergen Reactions  . Codeine     REACTION: "makes her crazy" Hives  . Sulfonamide Derivatives     REACTION: Rash    Current Outpatient Prescriptions on File Prior to Visit  Medication Sig Dispense Refill  . levothyroxine (SYNTHROID) 100 MCG tablet Take 1 tablet (100 mcg total) by mouth  daily.  30 tablet  0  . losartan (COZAAR) 50 MG tablet Take 1 tablet (50 mg total) by mouth daily.  30 tablet  0  . omeprazole (PRILOSEC) 40 MG capsule Take 1 capsule daily before eating.       [name] amitriptyline (ELAVIL) 10 MG tablet Take 1/2 to 1 tablet by mouth at bedtime as needed.       [name] denosumab (PROLIA) 60 MG/ML SOLN Inject 60 mg into the skin every 6 (six) months.          BP 153/93  Pulse 63  Temp(Src) 97.7 F (36.5 C) (Oral)  Resp 16  Ht 5\' 4"  (1.626 m)  Wt 141 lb (63.957 kg)  BMI 24.20 kg/m2       Objective:   Physical Exam  Constitutional: She appears well-developed and well-nourished.  Cardiovascular: Normal rate and regular rhythm.   Pulmonary/Chest: Effort normal and breath sounds normal.  Abdominal: Soft. Bowel sounds are normal. She exhibits no distension and no mass. There is no tenderness. There is no guarding.  Musculoskeletal:       Pt experienced severe right  sided back pain while turning on exam table          Assessment & Plan:

## 2010-10-16 NOTE — Telephone Encounter (Signed)
Advised pt that Dr Artist Pais would like her to return fasting for labwork. Pt states she is on her way to be seen today and currently has appt scheduled for 10/27/10.  Advised pt to see Dr Artist Pais first to determine if she will need any other labs or if she will need to keep appt on 10/27/10.

## 2010-10-16 NOTE — Patient Instructions (Signed)
Our office will contact you re:  Scheduling MRI of lumbar spine

## 2010-10-17 ENCOUNTER — Encounter: Payer: Self-pay | Admitting: Internal Medicine

## 2010-10-17 ENCOUNTER — Telehealth: Payer: Self-pay | Admitting: Internal Medicine

## 2010-10-17 LAB — HEPATIC FUNCTION PANEL
Albumin: 5.2 g/dL (ref 3.5–5.2)
Bilirubin, Direct: 0.1 mg/dL (ref 0.0–0.3)
Total Bilirubin: 0.6 mg/dL (ref 0.3–1.2)

## 2010-10-17 LAB — BASIC METABOLIC PANEL WITH GFR
BUN: 19 mg/dL (ref 6–23)
Calcium: 10.3 mg/dL (ref 8.4–10.5)
Creat: 0.8 mg/dL (ref 0.40–1.20)
GFR, Est African American: >60 mL/min (ref >60)
GFR, Est Non African American: >60 mL/min (ref >60)

## 2010-10-17 LAB — C-REACTIVE PROTEIN: CRP: 0.2 mg/dL (ref <0.6)

## 2010-10-17 NOTE — Telephone Encounter (Signed)
Created in errow

## 2010-10-21 ENCOUNTER — Encounter: Payer: Self-pay | Admitting: Internal Medicine

## 2010-10-21 ENCOUNTER — Other Ambulatory Visit: Payer: Self-pay | Admitting: *Deleted

## 2010-10-21 DIAGNOSIS — I519 Heart disease, unspecified: Secondary | ICD-10-CM

## 2010-10-21 DIAGNOSIS — E039 Hypothyroidism, unspecified: Secondary | ICD-10-CM

## 2010-10-21 MED ORDER — LEVOTHYROXINE SODIUM 100 MCG PO TABS: 100.0000 ug | ORAL_TABLET | Freq: Every day | ORAL | Status: DC

## 2010-10-21 NOTE — Progress Notes (Signed)
This encounter was created in error - please disregard.  This encounter was created in error - please disregard.

## 2010-10-21 NOTE — Telephone Encounter (Signed)
rx refill levothyroxine sent to [facility]

## 2010-10-22 ENCOUNTER — Telehealth: Payer: Self-pay | Admitting: Internal Medicine

## 2010-10-22 ENCOUNTER — Ambulatory Visit (HOSPITAL_BASED_OUTPATIENT_CLINIC_OR_DEPARTMENT_OTHER)
Admission: RE | Admit: 2010-10-22 | Discharge: 2010-10-22 | Disposition: A | Discharge status: Home / Self Care | Payer: No Typology Code available for payment source | Source: Ambulatory Visit | Attending: Internal Medicine | Admitting: Internal Medicine

## 2010-10-22 DIAGNOSIS — K219 Gastro-esophageal reflux disease without esophagitis: Secondary | ICD-10-CM

## 2010-10-22 DIAGNOSIS — M545 Low back pain: Secondary | ICD-10-CM

## 2010-10-22 DIAGNOSIS — M51379 Other intervertebral disc degeneration, lumbosacral region without mention of lumbar back pain or lower extremity pain: Secondary | ICD-10-CM | POA: Insufficient documentation

## 2010-10-22 DIAGNOSIS — M5137 Other intervertebral disc degeneration, lumbosacral region: Secondary | ICD-10-CM | POA: Insufficient documentation

## 2010-10-22 DIAGNOSIS — M4120 Other idiopathic scoliosis, site unspecified: Secondary | ICD-10-CM | POA: Insufficient documentation

## 2010-10-22 MED ORDER — OMEPRAZOLE 40 MG PO CPDR: 40.0000 mg | DELAYED_RELEASE_CAPSULE | Freq: Every day | ORAL | Status: DC

## 2010-10-22 NOTE — Telephone Encounter (Signed)
rx refill sent to pharmacy 

## 2010-10-22 NOTE — Telephone Encounter (Signed)
Refill- omeprazole dr 40mg  capsule. Take 1 capsule by mouth once a day 30 minutes before eating. Qty 90. Last fill 3.27.12

## 2010-10-23 ENCOUNTER — Telehealth: Payer: Self-pay | Admitting: Internal Medicine

## 2010-10-23 DIAGNOSIS — M549 Dorsalgia, unspecified: Secondary | ICD-10-CM

## 2010-10-23 NOTE — Telephone Encounter (Signed)
Call placed to patient at [phone], she was informed per Dr Artist Pais instructions, and that she would receive a call from referral coordinator with appointment date and time

## 2010-10-23 NOTE — Telephone Encounter (Signed)
Call pt - MRI of lumbar spine shows severe thoracolumbar scoliosis (reverse S shape) is associated with multilevel severe facet degeneration, and severe disc degeneration in the mid lumbar spine. Pt has subsequent multifactorial stenosis.   I suggest neurosurgical consultation.  See orders

## 2010-10-27 ENCOUNTER — Ambulatory Visit: Payer: No Typology Code available for payment source | Admitting: Internal Medicine

## 2010-10-27 NOTE — Assessment & Plan Note (Signed)
Pt experiencing severe intermittent back pain with movement. Obtain MRI Use tramadol as needed

## 2010-11-06 ENCOUNTER — Telehealth: Payer: Self-pay | Admitting: Internal Medicine

## 2010-11-06 DIAGNOSIS — M549 Dorsalgia, unspecified: Secondary | ICD-10-CM

## 2010-11-06 DIAGNOSIS — M419 Scoliosis, unspecified: Secondary | ICD-10-CM

## 2010-11-06 NOTE — Telephone Encounter (Signed)
Patient  Referral to Dr Danielle Dess ( does not take ins)   Referral then sent to  [facility]   Per Dr Nadene Rubins  Recommended Ms [name] see Dr Mart Piggs for scoliosis rather than neurospine   Please advise or send new referral

## 2010-11-06 NOTE — Telephone Encounter (Signed)
Yes,  Ok to change referral to Dr Noel Gerold

## 2010-11-11 NOTE — Consult Note (Signed)
VASCULAR SURGERY CONSULTATION   [name], [name]  DOB:  10/28/1934                                       04/09/2008  ZOXWR#:[id number]   The patient is a 76 year old female patient with a history of venous  disease, having been treated by [facility] 7-10  years ago (she is not certain of which surgeon treated her).  She had  sclerotherapy treatments for spider and reticular veins in both lower  extremities with a good result.  She has noticed recurrence of these  small varicosities over the last several years.  These cause some  stinging discomfort at intermittent times during the day with minimal  distal swelling.  She has had no bleeding, ulceration, thrombophlebitis,  deep venous thrombosis or other complications of venous disease.  She  does not wear elastic compression stockings nor had the need to elevate  the legs or take pain medications.   PAST MEDICAL HISTORY:  Negative for diabetes, hypertension, coronary  artery disease, COPD or stroke.  She does have a history of thyroid  disease, taking thyroid replacement medicines.   PREVIOUS SURGERY:  Bilateral breast biopsies for benign disease.   FAMILY HISTORY:  Positive for varicose veins in a sister, coronary  artery disease in a sister, negative for diabetes and stroke.   SOCIAL HISTORY:  She is widowed, has 3 children, self-employed.  She  does not use tobacco or alcohol.   REVIEW OF SYSTEMS:  Denies any chest pain, dyspnea on exertion, PND,  orthopnea, anorexia, weight loss, bronchitis, wheezing, GI or GU  symptoms.  Feet hurt when she walks but no calf claudication type  symptoms.   ALLERGIES:  Sulfa.   MEDICATIONS:  Synthroid 50 mcg daily.   PHYSICAL EXAM:  Blood pressure 150/85, heart rate 66, respirations 14.  Generally:  She is a healthy-appearing female in no apparent stress,  alert and oriented x3.  Neck:  Supple, 3+ carotid pulses palpable.  No  bruits are  audible.  Neurologic Exam:  Normal.  No palpable adenopathy  in the neck.  Chest:  Clear to auscultation.  Cardiovascular Exam:  Reveals a regular rhythm, no murmurs.  Upper extremity pulses are 3+  bilaterally.  No skin rashes are noted.  Abdomen:  Soft, nontender with  no masses.  She has 3+ femoral, popliteal and dorsalis pedis pulses  bilaterally with well-perfused lower extremities.  There is no  hyperpigmentation or ulceration distally.  No evidence of ischemia is  noted.  She has no bulbous varicosities.  She does have scattered  telangiectasias or small reticular veins in the medial thighs and medial  calf areas and down near the medial ankle.  There is a few reticular  veins in the right medial thigh in particular.  There is no evidence of  venous hypertension distally.   I do not think she has valvular incompetence or significant venous  hypertension, but rather diffuse small spider veins or reticular veins.  These would be best treated with sclerotherapy and possibly a  combination of skin laser treatments.  We will discuss this further with  her, and if she has any interest will schedule her in the near future  for this procedure depending on her wishes.   Quita Skye Hart [place], M.D.  Electronically Signed  JDL/MEDQ  D:  04/09/2008  T:  04/09/2008  Job:  [id number]

## 2010-11-14 NOTE — Op Note (Signed)
NAMESHUNDA, RABADI             ACCOUNT NO.:  [id number]   MEDICAL RECORD NO.:  [id number]          PATIENT TYPE:  AMB   LOCATION:  DSC                          FACILITY:  [facility]   PHYSICIAN:  Cindee Salt, M.D.       DATE OF BIRTH:  [birth year]   DATE OF PROCEDURE:  08/20/2005  DATE OF DISCHARGE:                                 OPERATIVE REPORT   PREOPERATIVE DIAGNOSIS:  Mucoid cyst, right thumb.   POSTOPERATIVE DIAGNOSIS:  Mucoid cyst, right thumb.   OPERATION:  Excision mucoid cyst, debridement interphalangeal joint, right  thumb.   SURGEON:  Kuzma.   ASSISTANT:  Carolyne Fiscal R.N.   ANESTHESIA:  Forearm based IV regional.   HISTORY:  The patient is a 76 year old female with a history of a mass over  the IP joint of her right thumb. X-rays revealed degenerative changes at the  joint.   PROCEDURE:  The patient is brought to the operating room after the area was  marked by both patient and surgeon. Questions answered. She was taken to the  operating room where a general anesthetic was carried out without  difficulty. She was prepped using DuraPrep, supine position, right arm free.  Curvilinear incision was made over the IP joint mass, carried down through  subcutaneous tissue. Bleeders were electrocauterized. The dissection carried  down to the joint. The joint cystic changes were immediately apparent. The  cyst was then removed after undermining the skin.  The joint was then opened  on its radial aspect.  A small rongeur was used then used to debride the  area. The wound was irrigated.  Specimen was sent to pathology. The skin was  then closed interrupted 5-0 nylon sutures. Sterile compressive dressing  splint applied to the thumb. The patient tolerated the procedure well, was  taken to the recovery observation in satisfactory condition. She is  discharged home to return to the [facility] [place] in 1 week on  [place] [facility].           ______________________________  Cindee Salt, M.D.     GK/MEDQ  D:  08/20/2005  T:  08/21/2005  Job:  [id number]

## 2010-11-14 NOTE — Consult Note (Signed)
[name], [name]             ACCOUNT NO.:  [id number]   MEDICAL RECORD NO.:  [id number]          PATIENT TYPE:  INP   LOCATION:  6743                         FACILITY:  [facility]   PHYSICIAN:  James L. Malon Kindle., M.D.DATE OF BIRTH:  1934-11-05   DATE OF CONSULTATION:  06/17/2005  DATE OF DISCHARGE:                                   CONSULTATION   REFERRING PHYSICIAN:  C. Ulyess Mort, M.D.   HISTORY:  A 76 year old white female who has been well until three or four  days ago, has had several visits to the emergency room for abdominal pain.  She describes the pain as being epigastric and right upper quadrant.  It had  become quite severe.  In the emergency room, she had CT and ultrasound done.  Ultrasound was negative.  Labs were normal.  She had a gallbladder  ultrasound and a CT done.  The ultrasound was negative for gallstones.  The  CT showed dilatation of the stomach and duodenal bulb and what appeared to  be an ulcer or duodenal diverticulum.  It was inflamed with collapsed  duodenum.  The patient also had a slightly elevated amylase and lipase.  The  pain had gone to her back but her back  hurts all the time.  She could not  really tell any difference.  Due to the continued pain and continued trips  to the emergency room and failure to respond to Protonix, she was admitted.  She had no previous history of ulcer disease, no history of GI bleeding.  Her weight has been stable.  No change in bowel movements.   MEDICATION ON ADMISSION:  Ibuprofen two b.i.d. to t.i.d.  for back pain, has  been taking this for a number of years.   ALLERGIES:  She is allergic and intolerant of various narcotics.  It appears  that it is more of a intolerance than true allergy.  She lists VICODIN,  DEMEROL, and MORPHINE is all making her sick.   PAST MEDICAL HISTORY:  1.  History of an irregular heartbeat, otherwise no chronic medical      problems.  This was apparently felt to be anxiety  related.  2.  She has had a pervious vaginal hysterectomy, has a known cystocele and      rectocele.   FAMILY HISTORY:  Noncontributory.   SOCIAL HISTORY:  She is married.  Her husband is an alcoholic.  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  She does not  smoke or drink.   PHYSICAL EXAMINATION:  GENERAL APPEARANCE:  A pleasant, nonicteric white  female in no acute distress.  She seems to be somewhat anxious.  VITAL SIGNS:  The patient is afebrile.  Blood pressure and pulse are normal.  LUNGS:  Clear.  CARDIOVASCULAR:  Regular rate and rhythm without murmurs or gallops.  ABDOMEN:  Nondistended and soft with very mild epigastric tenderness.   ASSESSMENT:  Abnormal CT suggesting also a slightly elevated amylase.  This  could be due to a perforated ulcer, some sort of tumor in this area or  inflamed duodenal diverticulum.  I agree an endoscopy would be appropriate.   PLAN:  Patient is scheduled for an endoscopy tomorrow at approximately 10  a.m.  I have discussed this with her and she is agreeable.           ______________________________  Llana Aliment. Malon Kindle., M.D.     Waldron Session  D:  06/17/2005  T:  06/19/2005  Job:  [id number]

## 2010-11-14 NOTE — Discharge Summary (Signed)
[name], [name]             ACCOUNT NO.:  [id number]   MEDICAL RECORD NO.:  [id number]          PATIENT TYPE:  INP   LOCATION:  5740                         FACILITY:  [facility]   PHYSICIAN:  Madaline Guthrie, M.D.    DATE OF BIRTH:  [birth year]   DATE OF ADMISSION:  06/26/2005  DATE OF DISCHARGE:  06/30/2005                                 DISCHARGE SUMMARY   DISCHARGE DIAGNOSES:  1.  Bilateral pleural effusions.  2.  Pulmonary nodules.  3.  Hypothyroidism.  4.  History of pancreatitis.  5.  Anemia.  6.  Scoliosis.  7.  Status post partial mastectomy for a benign breast lump.  8.  Status post partial hysterectomy, ovaries intact.  9.  History of cystocele and rectocele.   DISCHARGE MEDICATIONS:  1.  Protonix 40 mg p.o. b.i.d.  2.  Reglan 5 mg p.o. t.i.d. as needed for nausea.  3.  Synthroid 25 mcg p.o. daily.  4.  Tylenol 500 mg q.h.s. p.r.n. pain.  5.  Colace 100 mg p.o. b.i.d. as needed for constipation.   CONDITION AT DISCHARGE:  The patient is much improved at the time of  discharge.  Her shortness of breath has improved.  She will follow up with  Dr. Orma Flaming in the outpatient clinic in 1 week.  We will call her with the  appointment date and time.  She will need to have her electrolytes checked  at that time.  She will also need to have her TSH checked in approximately 4-  6 weeks.  She will need a follow up CT of the chest in 4-6 months.  She is  to seek immediate attention if she experiences progressive shortness of  breath.   PROCEDURES:  1.  Portable chest x-ray done on June 26, 2005 shows mild cardiomegaly      with decreasing bibasilar atelectasis.  A CT angiogram of the chest done      on June 27, 2005 showed no pulmonary embolus, reduced bilateral      pleural effusions.  It also shows cardiac enlargement with fluid in the      superior pericardial recesses, as well as several calcified granulomas      in the lungs ranging in size from 5 mm and below  with some calcified and      some non-calcified.  There are also several hypodense lesions that are      stable present in the liver.  2.  An ultrasound-guided thoracentesis done on June 27, 2005 was      performed, and 40 cc of pleural fluid was removed.  The patient      tolerated the procedure well.  A chest x-ray done on June 27, 2005      showed no pneumothorax, status post thoracentesis on the left.   CONSULTATIONS:  1.  Dr. Delford Field of pulmonary medicine.  2.  Dr. Donata Clay of [facility].   BRIEF HISTORY AND PHYSICAL:  [name] is a 76 year old white female who  was recently discharged from the hospital on June 23, 2005 for  pancreatitis, as well as right  pleural effusion.  She presents for complaint  of chest pain, shortness of breath, coughing over the past evening.  Cough  was productive of white phlegm, and the chest pain was left-sided and  constant versus intermittent, nonradiating and associated with coughing but  not exertion.   ALLERGIES:  1.  PERCOCET.  2.  MORPHINE.  3.  DEMEROL.  4.  SULFA.   PHYSICAL EXAMINATION:  VITAL SIGNS:  Temperature 98, blood pressure 147/83,  pulse 80, respirations 20, oxygen 96% on room air.  GENERAL:  She is alert and oriented, in no acute distress.  HEENT:  Pupils equal, round and reactive to light.  Anicteric.  Extraocular  muscles intact.  Oropharynx was clear.  NECK:  Supple.  __________.  CARDIOVASCULAR:  Regular rate and rhythm, no murmurs, rubs or gallops.  ____________  MUSCULOSKELETAL:  ___________ full range of motion of all joints  ___________.  There was 5/5 strength in the upper and lower extremities  bilaterally.   LABORATORY DATA:  Her white count was 7.2, hemoglobin ____________.   HOSPITAL COURSE BY PROBLEM:  Problem 1.  Bilateral pleural effusions with  significant shortness of breath with cough, but no fevers or sputum however  it did show bilateral pleural effusions, greater on the left than right.   A  CT of the chest also shows multiple nodules 5 mm in diameter.  Pulmonary  medicine was consulted.  After reviewing the records and CT of the chest,  Dr. Delford Field felt that the pleural effusions were unrelated to the pulmonary  nodules.  He recommended nodule to be followed up with a CT of the chest in  4-6 months.  The patient was still experiencing some shortness of breath and  left-sided discomfort, so a left-sided thoracentesis was performed and 40 cc  of pleural fluid was removed.  The pleural fluid was predominantly exudative  in nature with an LDH of 110 and albumin 2.6, ___________.  Cultures  ___________ negative.  AFB were negative.  Serum LDH at this time was 181  ___________.  The patient's shortness of breath dramatically improved status  post thoracentesis.  Her oxygen saturation remained stable through the rest  of the hospitalization.  There was still some concern for malignancy, given  the pulmonary nodules and exudative pleural effusion. There were mesothelial  cells in the pleural fluid making TB pleuritis unlikely. [facility] was consulted  for a possible biopsy of the pleura; however, after review of the CT,  surgery felt that the pulmonary nodules could be followed up with a CT of  the chest in 3-4 months.  At the time of discharge, no etiology for her  exudative pleural effusions was ascertained.  Cultures are negative to date,  but are not finalized.  Pending labs include a serum for fungal  immunodiffusion assay, hypersensitivity pneumonitis, ____________ serum  antibody assay.  Cytopathology is also pending.  Of note, her TSH was quite  elevated at 11.5.  She has a history of thyroid disorders, but has not been  on treatment for several years.  This may be a contributing factor in her  pleural effusions.  We will plan for the patient to have a follow up CT of  the chest in 3-4 months after treatmetn of her hypothyroidism.  She will follow up with Dr. Delford Field in his  office.   Problem 2.  Hypothyroidism.  The patient has a remote history of thyroid  disease.  She did take thyroid medication at some point in  the past.  However, she has been off it for several years.  A TSH performed on  admission was quite elevated at 11.5; however, free T4 and T3 were normal  (1.00 and 3.2 respectively).  The patient does complain of severe  constipation and was hyporeflexic on exam.  It was felt that she was would  benefit from a low dose of Synthroid.  She was started on Synthroid 25 mcg  daily.  We will recheck her TSH in 4-6 weeks in the outpatient clinic.   Problem 3.  History of pancreatitis and abdominal pain.  The patient came in  with diffuse abdominal pain, tender to palpation.  This seemed to resolve  over the first day of her admission, and she was relatively asymptomatic for  the rest of her hospitalization.  A lipase on admission was slightly  elevated at 62; however, she was hungry and started on a regular diet, which  she tolerated well with no abdominal pain.  A repeat lipase was 75; however,  this patient was asymptomatic.  This was not rechecked at the time of  discharge.  The patient is currently pain free abdomen-wise.  She will  continue on her Protonix 40 mg p.o. b.i.d.   Problem 4.  Back pain.  The patient does complain of chronic back pain for  which she was previously taking NSAIDs.  However, given her abdominal pain,  she was told she was told to avoid NSAIDs.  I have recommended that she take  Tylenol Extra Strength as needed for pain.  She will follow up with me in  the clinic regarding this.   Problem 5.  Preventative medicine.  The patient does have a gynecologist and  has been getting regular mammograms; however, she has not seen a primary  care physician for several years.  At the time of her hospital follow up, we  will set her up for a screening colonoscopy and breast exam.   DISCHARGE LABORATORIES AND VITALS:  On the day of  discharge, her vital signs  revealed a temperature of 97.6, pulse 68, respirations 20, blood pressure  ___________/75 ___________.  Her last set of labs showed a white cell count  of 8.1, ___________ platelets of _________.  BMET showed sodium ___________  bacterial ___________ antibody assay ___________ reactive pneumonitis  ___________, serum fungal assay.  Pending labs are _____________.      [name], M.D.      Madaline Guthrie, M.D.  Electronically Signed    DC/MEDQ  D:  06/30/2005  T:  06/30/2005  Job:  [id number]   cc:   [name], M.D. [facility]  [address]  [place]  [place] [address]   Kerin Perna, M.D.  [address]  [place]  [place] [address]

## 2010-11-14 NOTE — Consult Note (Signed)
[name], [name]             ACCOUNT NO.:  [id number]   MEDICAL RECORD NO.:  [id number]          PATIENT TYPE:  INP   LOCATION:  4707                         FACILITY:  [facility]   PHYSICIAN:  Shan Levans, M.D. LHCDATE OF BIRTH:  [birth year]   DATE OF CONSULTATION:  06/27/2005  DATE OF DISCHARGE:                                   CONSULTATION   CHIEF COMPLAINT:  Evaluate pulmonary nodules.   HISTORY OF PRESENT ILLNESS:  A 76 year old white female who was admitted on  June 26, 2005 to the Internal Medicine Service for evaluation of  dyspnea, cough, and chest discomfort, elevated D-dimer level but had a  negative CT scan of the chest for pulmonary emboli. A previous history of  anemia, scoliosis, history of H-pylori in the past, history of pleural  effusion in the past with elevated protein with previous thoracentesis but  primarily transudative in nature. History of duodenal ulcer and recently  actually discharged on June 23, 2005 for evaluation of the duodenal  ulcer and the pleural effusion with underlying pancreatitis as well. The  patient's chest discomfort has somewhat eased today. We were asked to  evaluate the pulmonary nodules and whether there is a relationship to the  pleural effusions. The patient states that her dyspnea has improved. She  denies any significant exposure history. She did live in [place] in the  1960's.   SOCIAL HISTORY:  She is a never smoker. Is married and lives at home with  her husband. 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 No TB exposure history that she  is aware of.   PAST MEDICAL HISTORY:  As noted above. Also a history of partial mastectomy,  hysterectomy, cystocele, and rectocele.   ADMISSION MEDICATIONS:  Protonix, Reglan, Mag-Ox, Vivelle, estrogen patch.   SOCIAL HISTORY:  She is married, as noted above.   FAMILY HISTORY:  Mother had a stroke. Father had a stroke.   REVIEW OF SYSTEMS:  Otherwise noncontributory. There has been  some hot  sweats and night sweats.   PHYSICAL EXAMINATION:  VITAL SIGNS:  Temperature 98, blood pressure 140/83,  pulse 80, respiratory rate 20. Saturation 96% on room air.  GENERAL:  No distress.  CHEST:  Clear bilateral without wheeze, rales, or rhonchi.  HEART:  Regular rate and rhythm without S3. Normal S1 and S2.  ABDOMEN:  Soft, nontender.  EXTREMITIES:  No edema or clubbing.  SKIN:  Clear.  NEUROLOGIC:  Examination was intact.  HEENT:  No jugular venous distention. No lymphadenopathy. Oropharynx clear.  NECK:  Supple. There is no peripheral lymphadenopathy noted.   LABORATORY DATA:  Chest x-ray showed mild cardiomegaly, bibasilar  atelectasis. CT scan of the chest was obtained and showed minimal pleural  effusions, bilateral pulmonary nodules, some calcified and some non-  calcified and they are all very small. No other hilar adenopathy or  mediastinal adenopathy is seen. No other infiltrates seen. No pulmonary  emboli seen.   IMPRESSION:  The pulmonary nodules appear to be benign granulomas, both  calcified and non-calcified. These do not appear to be related to pleural  effusions. They are small and  likely insignificant.   RECOMMENDATIONS:  Check serum fungal immunodiffusion assay. Recheck CT scan  in 4 to 6 months. No further pulmonary workup indicated. No biopsy  indicated.      Shan Levans, M.D. [facility]  Electronically Signed     PW/MEDQ  D:  06/27/2005  T:  06/28/2005  Job:  [id number]   cc:   Madaline Guthrie, M.D.  Fax: [phone]

## 2010-11-14 NOTE — Op Note (Signed)
[name], [name]             ACCOUNT NO.:  [id number]   MEDICAL RECORD NO.:  [id number]          PATIENT TYPE:  INP   LOCATION:  6743                         FACILITY:  [facility]   PHYSICIAN:  James L. Malon Kindle., M.D.DATE OF BIRTH:  12/06/1934   DATE OF PROCEDURE:  06/18/2005  DATE OF DISCHARGE:                                 OPERATIVE REPORT   PROCEDURE:  Esophagogastroduodenoscopy.   MEDICATIONS:  Phenergan 12.5 mg, Versed 6 mg IV.   INDICATIONS:  The patient has had vague right upper quadrant pain for  several days.  A CT scan showed dilation of the stomach and the duodenal  bulb.  There is a question of a collapsed second duodenum versus duodenal  diverticulum, possible ulceration.  The gallbladder was not remarkable on  previous ultrasound and showed no gallstones.  The pancreas was not  abnormal.  This was done to evaluate that area endoscopically.  It is  notable the patient's white count is normal, hemoglobin is slightly low at  10.5 although it was after fluids, without any clinical signs of bleeding.  Liver tests were unremarkable.  CK enzymes negative.  Urinalysis negative.   DESCRIPTION OF PROCEDURE:  The procedure was explained to the patient and  consent obtained.  In the left lateral decubitus position, the Olympus scope  was inserted and advanced.  The stomach was entered, pylorus identified and  passed.  The duodenum was completely normal endoscopically.  We went down  into the second portion as far as the scope could see and very carefully  withdrew, with no ulceration, inflammation, no obvious diverticulum, and the  bulb appeared normal.  The antrum and body of the stomach were normal.  No  ulceration or inflammation.  Fundus and cardia were seen well on the  retroflexed view and appeared to be normal.  The distal and proximal  esophagus were endoscopically normal.  The scope was withdrawn and the  patient tolerated the procedure well.   ASSESSMENT:   Abnormal CT with no obvious explanation on upper endoscopy to  explain the patient's pain.   PLAN:  Will go ahead with an upper GI and small bowel follow-through to rule  out a partial small bowel obstruction.  Keep on clear liquids and a proton  pump inhibitor for now.           ______________________________  Llana Aliment. Malon Kindle., M.D.     Waldron Session  D:  06/18/2005  T:  06/20/2005  Job:  [id number]   cc:   C. Ulyess Mort, M.D.  Fax: [phone]

## 2010-11-14 NOTE — Discharge Summary (Signed)
[name], [name]             ACCOUNT NO.:  [id number]   MEDICAL RECORD NO.:  [id number]          PATIENT TYPE:  INP   LOCATION:  6743                         FACILITY:  [facility]   PHYSICIAN:  [name], M.D.DATE OF BIRTH:  March 29, 1935   DATE OF ADMISSION:  06/17/2005  DATE OF DISCHARGE:  06/23/2005                                 DISCHARGE SUMMARY   DISCHARGE DIAGNOSES:  1.  Epigastric pain, questionable cause.  2.  Anemia.  3.  Shortness of breath due to bilateral pleural effusions.  4.  Scoliosis.  5.  T-wave inversions in V1, V2 and V3.  6.  Hypokalemia.   DISCHARGE MEDICATIONS:  1.  Protonix 40 mg twice daily.  2.  Vicodin 1 tablet p.o. q.6h. p.r.n. pain.  3.  Reglan 5 mg p.o. t.i.d. p.r.n. nausea.  4.  Magnesium oxide 400 mg p.o. t.i.d. x4 days.   CONDITION ON DISCHARGE:  The patient was stable at the time of discharge.  Her epigastric pain had resolved significantly.  Shortness of breath had  also improved.  She will follow up with Dr. Silvestre [facility]  on July 03, 2005.  At that time, she needs to be evaluated clinically for  improvement in the epigastric pain.  A chest x-ray needs to be done to check  for improvement in the pleural effusion.  Hemoglobin needs to be checked to  assess for improvement in her anemia.   PROCEDURES:  1.  An EGD was done which showed no ulcers.  2.  CT scan of the abdomen which showed rectosigmoid diverticulosis and a      cystic lesion in the liver.  A CT angiogram to rule out PE was negative      for pulmonary embolus but showed pleural effusion.  A small bowel follow-      through was done which showed esophageal dysmotility, GERD, possible      mechanical obstruction due to marked scoliosis but the dye did      eventually reach the colon.   CONSULTATIONS:  [name], gastroenterology.   HISTORY OF PRESENT ILLNESS:  [name] is a 76 year old white lady with no  significant past medical history.  She  presented with epigastric pain.  She  had been to the ED a few days prior and was sent home but the pain did not  subside so she came back.  The pain is described as severe, nonradiating,  associated with nausea and vomiting.  No history of diarrhea but gives a  history of constipation.  She also gives a history of heartburn.  The  patient was taking ibuprofen about 40 mg b.i.d. secondary to chronic back  pain.   ALLERGIES:  NARCOTICS.   PAST MEDICAL HISTORY:  1.  Symptomatic cystocele, rectocele.  2.  History of status post vaginal hysterectomy.  3.  History of scoliosis.   MEDICATIONS:  Ibuprofen 400 mg b.i.d.   SUBSTANCE HISTORY:  Negative.   SOCIAL HISTORY:  Lives with husband.   REVIEW OF SYSTEMS:  Negative.   PHYSICAL EXAMINATION:  VITAL SIGNS:  Temperature 98, blood  pressure 160/96,  pulse 77, respiratory rate 30.  O2 saturation 99%.  GENERAL:  Not in acute distress, pleasant elderly lady.  HEENT:  Pupils are equal and reactive.  ENT no lesions.  NECK:  Supple.  No JVD.  LUNGS:  Clear to auscultation bilaterally.  CVS:  Regular rate and rhythm.  No murmurs, gallops, or rubs.  GI:  Bowel sounds present.  Mildly distended.  No rebound tenderness.  SKIN:  Dry.  CNS:  Cranial nerves II through XII intact.   ADMISSION LABORATORY DATA:  Hemoglobin 13.8, hematocrit 39, WBC 19.7,  platelets 191,000.  Sodium 137, potassium 3.6, chloride 104, bicarbonate 92,  BUN 10, creatinine 0.9, and a glucose of 118.  Bilirubin 0.7, alkaline  phosphate 73, SGOT 37, SGPT 19, protein 7.4, albumin 4.1, calcium 8.9.   HOSPITAL COURSE:  Problem 1:  EPIGASTRIC PAIN/QUESTION GERD:  [name] is  a 76 year old lady who presented with a history of chronic back pain and  taking ibuprofen twice daily.  She presents with epigastric pain.  Her  initial lipase was 1593 but later went down to 32 on the next day.  Her  initial amylase was 72 but had normalized to 78 on the next day.  Because of  her  history of taking ibuprofen, a perforated ulcer leading to acute  pancreatitis was suspected initially.  A CT scan of the abdomen was done  which did show acute pancreatitis but showed rectosigmoid diverticulosis and  a cystic lesion in the liver.  Because of a such a rapid decrease in the  lipase within a day, the diagnosis of acute pancreatitis was doubtful.  An  EGD was done to rule out peptic ulcer.  An EGD was normal, although a H.  pylori antigen was 6.5 and was positive.  She was not treated with triple  therapy because of the absence of an ulcer.  Because of persistent pain and  her new diagnosis based on the EGD and the CT scan, a GI follow-through was  done which showed esophageal dysmotility, GERD, possible mechanical  obstruction due to marked scoliosis but the dye did eventually pass down  into the colon.   During the course of hospitalization, she was treated with Protonix and pain  medications.  The pain decreased significantly, and at the time of  discharge, she was asymptomatic on the Protonix.  She was sent home on  Protonix 40 mg b.i.d.  The most likely cause for the epigastric pain was  severe GERD.   Problem 2:  ANEMIA:  The patient presented with an initial hemoglobin of  13.8 but decreased to 10.5 and then 10.1.  She could have been dehydrated  initially which lead to hemoconcentration or she could have been bleeding  from the diverticulosis in her sigmoid colon.  Her fecal occult blood  testing was negative.  She may need a colonoscopy in the near future.  She  is more than 76 years old.  Her hemoglobin remained stable at 10.1  throughout the hospitalization.  Her iron studies have been sent but need to  be followed up in the outpatient clinic.   Problem 3:  SHORTNESS OF BREATH:  During the course of hospitalization, the  patient developed acute onset of shortness of breath.  Pulmonary embolism was suspected and a CT angiogram was done which was negative for the  PE.  The chest x-ray showed pleural effusion, right more than the left.  A  therapeutic tap was done which produced  about 160 mL of fluid which was  transudate.  Congestive heart failure was suspected as the cause of the  pleural effusion.  Echocardiogram was done which showed normal ventricular  systolic function.  Ejection fraction between 55-60% and mild mitral valve  prolapse, mild mitral regurgitation.  Serial chest x-rays showed decrease in  the pleural effusion.  The patient was sent home and she was stable but this  pleural effusion needs to be evaluated in the outpatient clinic with serial  chest x-rays to assess for resolution.  The etiology of this bilateral  pleural effusion was unknown.   Problem 4:  SCOLIOSIS:  The patient has had severe scoliosis for a very long  period of time.  She has L2 and L3 listhesis.  She has never seen an  orthopedic doctor.  At the time of discharge, she was referred to an  orthopedic doctor.   Problem 5:  T-WAVE INVERSIONS IN V1, V2 AND V3:  Although the T-wave  inversions were new, she did not complain of chest pain and cardiac enzymes  that were cycled in the emergency room were normal.   Problem 6:  HYPERKALEMIA:  She was hyperkalemic during hospitalization.  She  was replaced with potassium orally.  Her magnesium was also low so she was  sent home on magnesium oxide to be taken for two weeks.   At the time of discharge, the patient was stable.  She did not have  epigastric pain.  Her hemoglobin ws 10.5, hematocrit 30, WBC 7, and  platelets 313,000.  Her magnesium was 1.7, sodium was 140, potassium 4,  chloride 104, bicarbonate 31, BUN 1, creatinine 0.7 and a glucose of 94.  She will follow up with Dr. Silvestre Foster at the outpatient clinic.      Ronda Fairly, M.D.    ______________________________  [name], M.D.    Margreta Journey  D:  10/01/2005  T:  10/02/2005  Job:  [id number]   cc:   [facility]   Fayrene Fearing  L. Malon Kindle., M.D.  Fax: [phone]

## 2010-11-14 NOTE — Op Note (Signed)
NAMEMAJESTI, GAMBRELL                       ACCOUNT NO.:  [id number]   MEDICAL RECORD NO.:  [id number]                   PATIENT TYPE:  INP   LOCATION:  9326                                 FACILITY:  [facility]   PHYSICIAN:  Miguel Aschoff, M.D.                    DATE OF BIRTH:  1935-02-19   DATE OF PROCEDURE:  11/03/2002  DATE OF DISCHARGE:                                 OPERATIVE REPORT   PREOPERATIVE DIAGNOSIS:  Symptomatic pelvic relaxation with uterine  prolapse, third degree cystocele, secondary rectocele, third degree  prolapse.   POSTOPERATIVE DIAGNOSIS:  Symptomatic pelvic relaxation with uterine  prolapse, third degree cystocele, secondary rectocele, third degree  prolapse.   OPERATION/PROCEDURE:  Total vaginal hysterectomy with anterior and posterior  colporrhaphy.   SURGEON:  Miguel Aschoff, M.D.   ASSISTANT:  Carrington Clamp, M.D.   ANESTHESIA:  General.   CONSULTATIONS:  None.   JUSTIFICATION:  The patient is 76 year old white female with history of  pelvic pressure and noting something protruding through the vagina.  On  examination she was noted to have the cervix protruding through the vagina.  This was associated with a significant cystocele and rectocele with positive  symptoms associated with prolapse.  She presents now to undergo repair via  total vaginal hysterectomy and anterior and posterior colporrhaphy.  The  risks and benefits of the procedure were discussed with the patient.   DESCRIPTION OF PROCEDURE:  The patient was taken to the operating room,  placed in the supine position.  General anesthesia was administered without  difficulty.  She was then placed in the dorsal lithotomy position, prepped  and draped in the usual sterile fashion.  Bladder was catheterized.  At this  point speculum was placed in the vaginal vault.  Anterior cervical lip was  grasped with a tenaculum and the cervix was injected with 1% Xylocaine with  epinephrine.  At this  point the cervical mucosa was circumscribed and  dissected anterior and posteriorly until the peritoneal reflections could be  found.  Peritoneum was then entered posteriorly.  The uterosacral ligaments  were identified and clamped with curved Heaney clamps.  These pedicles were  cut and suture ligated using suture ligatures of 0 Vicryl.  These pedicles  were then held.  The cardinal ligaments were then clamped, cut and suture  ligated in similar fashion using suture ligatures of 0 Vicryl and additional  bites were taken of the paracervical fascia using curved Heaney clamps.  Again all sutures were 0 Vicryl suture ligatures.  Peritoneum was then  entered anteriorly with care to avoid any injury to the bladder.  Uterine  vessels were then identified and clamped with curved Heaney clamps and all  the pedicles were suture ligated using suture ligatures of 0 Vicryl.  Additional bites were then taken of the broad ligament structures and the  fundus was delivered through the cul-de-sac.  The  residual portion of the  broad ligament, round ligament, utero-ovarian ligament, and fallopian tube  were then clamped with curved Heaney clamps bilaterally and the specimen was  excised.  These large pedicles were then suture ligated using suture  ligatures of 0 Vicryl and then free ties of 0 Vicryl.  These pedicles were  held.  At this point all pedicles were inspected and there appeared to be  excellent hemostasis.  Because of fear of the patient developing an  enterocele, the cul-de-sac was reduced using several interrupted sutures of  0 Vicryl and the uterosacral ligaments were identified and plicated in the  midline using suture ligatures of 0 Vicryl.  Vaginal cuff was then sutured  to the uterosacral ligaments for support of the apex of the cuff.  Once this  was done, a pursestring suture of 0 Vicryl was used to close the peritoneum.  Lap counts and instrument counts were taken and found to be correct.   Foley  catheter was inserted.  Clear urine was obtained.  At this point attention was directed to the cystocele.  The anterior vaginal  wall was injected with 1% Xylocaine with epinephrine and then the vaginal  mucosa was dissected in the midline to within 2 cm of  the urethral meatus.  Paravesical fascia was then dissected free of the vaginal mucosa.  The  cystocele was then reduced using two pursestring sutures of 2-0 Vicryl and  then the fascia was approximated using interrupted 2-0 Vicryl sutures.  Excess vaginal mucosa was then trimmed and then the anterior vaginal wall  was closed using running interlocking 2-0 Vicryl suture incorporating the  underlying fascia to close the dead space.  Then the apex of the cuff was  closed using interrupted 0 Vicryl sutures.   Attention was then directed to the posterior vaginal wall.  Posterior  vaginal wall was injected with 1% Xylocaine with epinephrine.  An ellipse of  skin was then cut in the perineum and then the dissection of the vaginal  mucosa was carried out to the midline for approximately 6 cm.  The  pararectal fascia was then dissected free off the vaginal mucosa.  Rectocele  was identified and reduced using several interrupted 0 Vicryl sutures.  After this was done, levator muscles were reapproximated using interrupted 0  Vicryl sutures.  The rectus fascia and mucosa was then trimmed and then the  vaginal mucosa was reapproximated using running interlocking 2-0 Vicryl  suture again closing the dead space below the mucosa.  Perineal body was  reconstituted using a crown suture of 0 Vicryl and the subcutaneous tissue  was closed using running interrupted 2-0 Vicryl suture and perineal skin  closed using subcuticular 2-0 Vicryl suture.  An Iodoform pack was then  placed and the procedure was completed.  The patient was taken out of the lithotomy position and brought to the recovery room in satisfactory  condition.  Estimated blood loss  was less than 100 mL.  The patient  tolerated the procedure well.  Prior to completion of the case, an Iodoform  vaginal pack was placed in the vaginal vault.  This will be removed on Nov 04, 2002.                                               Miguel Aschoff, M.D.    AR/MEDQ  D:  11/03/2002  T:  11/06/2002  Job:  [id number]

## 2010-11-14 NOTE — Discharge Summary (Signed)
   NAMEEMBERLEE, SORTINO                       ACCOUNT NO.:  [id number]   MEDICAL RECORD NO.:  [id number]                   PATIENT TYPE:  INP   LOCATION:  9128                                 FACILITY:  [facility]   PHYSICIAN:  Miguel Aschoff, M.D.                    DATE OF BIRTH:  1934-12-30   DATE OF ADMISSION:  11/03/2002  DATE OF DISCHARGE:  11/04/2002                                 DISCHARGE SUMMARY   ADMISSION DIAGNOSIS:  Symptomatic pelvic relaxation with cystocele,  rectocele and uterine descensus.   FINAL DIAGNOSIS:  Symptomatic pelvic relaxation with cystocele, rectocele  and uterine descensus.   OPERATIONS AND PROCEDURES:  Total vaginal hysterectomy with anterior and  posterior colporrhaphy.   BRIEF HISTORY:  The patient is a 76 year old white female who reported  pelvic pressure and heaviness and noted something protruding through the  introitus.  On examination it was noted that she had a large cystocele and  the cervix presenting at the introitus and because of the symptoms related  to this she has requested definitive repair and was admitted to the hospital  to undergo a total vaginal hysterectomy and anterior and posterior  colporrhaphy.  Preoperative studies were obtained and these included an  admission hemoglobin of 13.3, hematocrit of 39.6 and white count 5,400.  Chemistry profile was essentially within normal limits.  Urinalysis was  negative.   HOSPITAL COURSE:  Under general anesthesia a total vaginal hysterectomy and  anterior and posterior colporrhaphy were carried out without difficulty.  The patient's postoperative course was essentially uncomplicated.  It was  possible to remove the Foley catheter on the first postoperative day.  The  patient was ambulating, doing well and requested to be discharged later on  Nov 04, 2002.   DISCHARGE MEDICATIONS:  1. Tylox one every three hours as needed for pain.  2. Phenergan 25 mg every six hours as needed for  nausea.   DIET:  She was sent home on a regular diet.   ACTIVITY:  She was instructed to do no heavy lifting and to place nothing in  the vagina.   SPECIAL INSTRUCTIONS:  She is to call if there are any problems such as  fever, pain or heavy bleeding.   FOLLOW UP:  She will be seen back in the office in four weeks for a follow-  up examination.   PATHOLOGY:  The pathology report revealed a 59 gram uterus, the cervix with  squamous metaplasia, atrophic endometrium, serosal adhesions, myometrium was  unremarkable.                                               Miguel Aschoff, M.D.    AR/MEDQ  D:  12/11/2002  T:  12/11/2002  Job:  [id number]

## 2010-12-01 ENCOUNTER — Other Ambulatory Visit: Payer: Self-pay | Admitting: Internal Medicine

## 2010-12-01 NOTE — Telephone Encounter (Signed)
Rx refill sent to pharmacy. Office visit past due

## 2010-12-29 ENCOUNTER — Other Ambulatory Visit: Payer: Self-pay | Admitting: Internal Medicine

## 2010-12-29 NOTE — Telephone Encounter (Signed)
Rx refill sent to pharmacy. 

## 2011-01-26 ENCOUNTER — Encounter: Payer: Self-pay | Admitting: Internal Medicine

## 2011-01-26 ENCOUNTER — Ambulatory Visit (INDEPENDENT_AMBULATORY_CARE_PROVIDER_SITE_OTHER): Payer: No Typology Code available for payment source | Admitting: Internal Medicine

## 2011-01-26 VITALS — BP 130/80 | HR 61 | Temp 97.9°F | Wt 141.0 lb

## 2011-01-26 DIAGNOSIS — I10 Essential (primary) hypertension: Secondary | ICD-10-CM

## 2011-01-26 MED ORDER — LOSARTAN POTASSIUM 100 MG PO TABS: 100.0000 mg | ORAL_TABLET | Freq: Every day | ORAL | Status: DC

## 2011-01-26 NOTE — Assessment & Plan Note (Signed)
Suboptimal control. Asx. Increase losartan 100mg  po qd. Monitor bp as outpt and followup in clinic in approximately one week or sooner if necessary.

## 2011-01-26 NOTE — Progress Notes (Signed)
  Subjective:    Patient ID: [name], female    DOB: 12-06-1934, 76 y.o.   MRN: [id number]  HPI Pt presents to clinic for evaluation of elevated bp. Has been in usual state of health and noticed elevated bp x ~2wks. Compliant with medication without adverse effect. Denies pain, anxiety, recent illness or infxn. Home bp log reviewed with most recent values of 158/104, 186/102/174/104, 162/92, 163/97 and 117/72. Clinic bp reviewed as nl and repeated to be 128/82. Denies ha, dizziness or neurologic sx. No exacerbating or alleviating factors. Took extra dose of losartan 2 days ago. No other complaints.  Reviewed pmh, medications and allergies    Review of Systems see hpi    Objective:   Physical Exam  Nursing note and vitals reviewed. Constitutional: She appears well-developed and well-nourished. No distress.  HENT:  Head: Normocephalic.  Eyes: Conjunctivae are normal. No scleral icterus.  Neck: Neck supple. No JVD present. Carotid bruit is not present.  Cardiovascular: Normal rate, normal heart sounds and intact distal pulses.  Exam reveals no gallop and no friction rub.   No murmur heard. Pulmonary/Chest: Effort normal and breath sounds normal. No respiratory distress. She has no wheezes. She has no rales.  Neurological: She is alert.  Skin: Skin is warm and dry. She is not diaphoretic.  Psychiatric: She has a normal mood and affect.          Assessment & Plan:   No problem-specific assessment & plan notes found for this encounter.

## 2011-02-04 ENCOUNTER — Telehealth: Payer: Self-pay | Admitting: *Deleted

## 2011-02-04 NOTE — Telephone Encounter (Signed)
Patient called and left voice message stating the her blood pressure has been elevated just about every 3 days. This am it was 172/116. She would like to know what she should do.

## 2011-02-04 NOTE — Telephone Encounter (Signed)
Call placed to patient at [phone] regarding her blood pressure readings. She denies having chest pains, no shortness of breath, no headaches. She states her previous blood pressure have been elevated off and on. She was informed that she was advised to follow up in one week after the medication change, and reminded that there was not an office visit scheduled for follow up.  Patient was advised to continue to monitor her blood pressure and bring her readings with her to appointment scheduled for Monday 02/09/2011. Patient has verbalized understanding and agrees.

## 2011-02-04 NOTE — Telephone Encounter (Signed)
See if sx's of cp, dyspnea, neurologic sx's. If + sx's then ed. Otherwise need full list of bp's and make office visit

## 2011-02-09 ENCOUNTER — Encounter: Payer: Self-pay | Admitting: Internal Medicine

## 2011-02-09 ENCOUNTER — Ambulatory Visit (INDEPENDENT_AMBULATORY_CARE_PROVIDER_SITE_OTHER): Payer: No Typology Code available for payment source | Admitting: Internal Medicine

## 2011-02-09 VITALS — BP 164/100 | HR 67 | Temp 97.9°F | Resp 18 | Wt 141.0 lb

## 2011-02-09 DIAGNOSIS — R002 Palpitations: Secondary | ICD-10-CM

## 2011-02-09 DIAGNOSIS — R0609 Other forms of dyspnea: Secondary | ICD-10-CM

## 2011-02-09 DIAGNOSIS — I10 Essential (primary) hypertension: Secondary | ICD-10-CM

## 2011-02-09 DIAGNOSIS — R0989 Other specified symptoms and signs involving the circulatory and respiratory systems: Secondary | ICD-10-CM

## 2011-02-09 MED ORDER — AMLODIPINE BESYLATE 5 MG PO TABS: 5.0000 mg | ORAL_TABLET | Freq: Every day | ORAL | Status: DC

## 2011-02-10 ENCOUNTER — Other Ambulatory Visit: Payer: Self-pay | Admitting: Internal Medicine

## 2011-02-10 NOTE — Telephone Encounter (Signed)
OK to give #30 with no refills.

## 2011-02-10 NOTE — Telephone Encounter (Signed)
Please advise refill? 

## 2011-02-11 DIAGNOSIS — R001 Bradycardia, unspecified: Secondary | ICD-10-CM | POA: Insufficient documentation

## 2011-02-11 NOTE — Assessment & Plan Note (Signed)
Brief and minimally symptomatic. EKG obtained demonstrated sinus rhythm with normal intervals and axis. No evidence of arrhythmia. Recommend cardiac monitoring if symptoms persist or worsen.

## 2011-02-11 NOTE — Assessment & Plan Note (Signed)
suboptimal control. Asymptomatic. Begin Norvasc 5 mg daily. Monitor blood pressures in outpatient followup closely in one week or sooner if necessary.

## 2011-02-11 NOTE — Progress Notes (Signed)
  Subjective:    Patient ID: [name], female    DOB: Jan 27, 1935, 76 y.o.   MRN: [id number]  HPI patient presents to clinic for evaluation of multiple medical problems. Blood pressure recently elevated without headache or dizziness. Cozaar increased but blood pressure remains elevated. Notes three-day history of intermittent fluttering of her heart. Duration approximately several seconds. No syncope chest pain shortness of breath or dizziness. TSH review normal April 2012. No exacerbating or alleviating factors. No other complaints.  Reviewed past medical history, medications and allergies    Review of Systems see history of present illness     Objective:   Physical Exam  Nursing note and vitals reviewed. Constitutional: She appears well-developed and well-nourished. No distress.  HENT:  Head: Normocephalic and atraumatic.  Right Ear: External ear normal.  Left Ear: External ear normal.  Eyes: Conjunctivae are normal. No scleral icterus.  Neck: Neck supple. Carotid bruit is not present.  Cardiovascular: Normal rate, regular rhythm and normal heart sounds.  Exam reveals no gallop and no friction rub.   No murmur heard. Pulmonary/Chest: Effort normal and breath sounds normal. No respiratory distress. She has no wheezes. She has no rales.  Neurological: She is alert.  Skin: Skin is warm and dry. She is not diaphoretic.  Psychiatric: She has a normal mood and affect.          Assessment & Plan:

## 2011-02-17 ENCOUNTER — Encounter: Payer: Self-pay | Admitting: Internal Medicine

## 2011-02-17 ENCOUNTER — Other Ambulatory Visit: Payer: Self-pay | Admitting: Internal Medicine

## 2011-02-17 ENCOUNTER — Ambulatory Visit (INDEPENDENT_AMBULATORY_CARE_PROVIDER_SITE_OTHER): Payer: No Typology Code available for payment source | Admitting: Internal Medicine

## 2011-02-17 DIAGNOSIS — R0789 Other chest pain: Secondary | ICD-10-CM

## 2011-02-17 DIAGNOSIS — I10 Essential (primary) hypertension: Secondary | ICD-10-CM

## 2011-02-17 MED ORDER — AMLODIPINE BESYLATE 10 MG PO TABS: 10.0000 mg | ORAL_TABLET | Freq: Every day | ORAL | Status: DC

## 2011-02-17 NOTE — Assessment & Plan Note (Signed)
May represent anginal equivalent. Schedule nuclear stress test.

## 2011-02-17 NOTE — Assessment & Plan Note (Signed)
Improved but suboptimal control. Increase norvasc 10mg  po qd. Monitor bp as out pt and followup closely in clinic as scheduled for re-evaluation.

## 2011-02-17 NOTE — Progress Notes (Signed)
  Subjective:    Patient ID: [name], female    DOB: 08-08-1934, 76 y.o.   MRN: [id number]  HPI Pt presents to clinic for followup of multiple medical problems. Recent loss of bp control reveiwed. Last visit norvasc 5mg  qd added and tolerates well without leg swelling or other complaint. BP log reviewed with 50% normotensive and ~50% 140-150's with DBP avg mid 90's. Also notes exertional neck heaviness that occurs within a few minutes of walking and resolves with rest. Denies chest pain or dyspnea. Currently asx. Does not recall cardiac workup for at least 2 years. No other exacerbating or alleviating factors.  No other complaints.  Past Medical History  Diagnosis Date  . Thyroid disease     hypothyroid  . Anemia   . Osteoporosis   . Pleural effusion, bilateral     history of  . Pulmonary nodules     history of  . History of pancreatitis   . Cystocele     history with rectocele  . Scoliosis   . Varicose vein   . Routine general medical examination at a health care facility    Past Surgical History  Procedure Date  . Breast surgery 07/30/01    biopsy x 3  . Abdominal hysterectomy 04/29/04    partial    reports that she has never smoked. She has never used smokeless tobacco. She reports that she drinks alcohol. She reports that she does not use illicit drugs. family history includes Coronary artery disease in her other; Heart disease in her daughter; Lymphoma in her daughter; and Stroke in her other. Allergies  Allergen Reactions  . Codeine     REACTION: "makes her crazy" Hives  . Sulfonamide Derivatives     REACTION: Rash     Review of Systems see hpi     Objective:   Physical Exam  Physical Exam  Nursing note and vitals reviewed. Constitutional: Appears well-developed and well-nourished. No distress.  HENT:  Head: Normocephalic and atraumatic.  Right Ear: External ear normal.  Left Ear: External ear normal.  Eyes: Conjunctivae are normal. No scleral icterus.   Neck: Neck supple. Carotid bruit is not present.  Cardiovascular: Normal rate, regular rhythm and normal heart sounds.  Exam reveals no gallop and no friction rub.   No murmur heard. Pulmonary/Chest: Effort normal and breath sounds normal. No respiratory distress. He has no wheezes. no rales.  Lymphadenopathy:    He has no cervical adenopathy.  Neurological:Alert.  Skin: Skin is warm and dry. Not diaphoretic.  Psychiatric: Has a normal mood and affect.        Assessment & Plan:

## 2011-02-18 ENCOUNTER — Ambulatory Visit (INDEPENDENT_AMBULATORY_CARE_PROVIDER_SITE_OTHER): Payer: No Typology Code available for payment source | Admitting: Internal Medicine

## 2011-02-18 ENCOUNTER — Telehealth: Payer: Self-pay | Admitting: Internal Medicine

## 2011-02-18 DIAGNOSIS — R0789 Other chest pain: Secondary | ICD-10-CM

## 2011-02-18 NOTE — Telephone Encounter (Signed)
Call placed to Cardiology at [phone], spoke with Judithe Modest, she stated the test that was entered was not the correct test.  Correct test was entered.

## 2011-02-18 NOTE — Telephone Encounter (Signed)
Need assistance ordering nuclear exercise stress test in epic. Dx- exertional chest pressure

## 2011-02-18 NOTE — Telephone Encounter (Signed)
Received call from nuclear med, you cannot order the test. It has to be ordered by cardiology.

## 2011-02-23 ENCOUNTER — Other Ambulatory Visit: Payer: Self-pay | Admitting: Internal Medicine

## 2011-02-23 DIAGNOSIS — R0789 Other chest pain: Secondary | ICD-10-CM

## 2011-03-04 ENCOUNTER — Encounter: Payer: Self-pay | Admitting: *Deleted

## 2011-03-11 ENCOUNTER — Ambulatory Visit (HOSPITAL_COMMUNITY): Payer: No Typology Code available for payment source | Attending: Internal Medicine | Admitting: Radiology

## 2011-03-11 VITALS — Ht 65.0 in | Wt 141.0 lb

## 2011-03-11 DIAGNOSIS — R0609 Other forms of dyspnea: Secondary | ICD-10-CM

## 2011-03-11 DIAGNOSIS — R0789 Other chest pain: Secondary | ICD-10-CM | POA: Insufficient documentation

## 2011-03-11 DIAGNOSIS — I4949 Other premature depolarization: Secondary | ICD-10-CM

## 2011-03-11 MED ORDER — TECHNETIUM TC 99M TETROFOSMIN IV KIT
33.0000 | PACK | Freq: Once | INTRAVENOUS | Status: AC | PRN
  Administered 2011-03-11: 33 via INTRAVENOUS

## 2011-03-11 MED ORDER — TECHNETIUM TC 99M TETROFOSMIN IV KIT
11.0000 | PACK | Freq: Once | INTRAVENOUS | Status: AC | PRN
  Administered 2011-03-11: 11 via INTRAVENOUS

## 2011-03-11 NOTE — Progress Notes (Signed)
[facility] SITE 3 NUCLEAR MED [address] [place] [address] [phone]  Cardiology Nuclear Med Study  [name] is a 76 y.o. female [id number] [birth year]   Nuclear Med Background Indication for Stress Test:  Evaluation for Ischemia History:  5-6 yrs ago GXT (bicycle):OK per patient; '11 Echo:EF=55-60%, mild MVP Cardiac Risk Factors: Family History - CAD and Hypertension  Symptoms:  Chest Pressure with and without Exertion (last episode of chest discomfort was about 2-days ago), DOE/SOB, Palpitations, Rapid HR    Nuclear Pre-Procedure Caffeine/Decaff Intake:  None NPO After: 9:00pm   Lungs:  Clear. IV 0.9% NS with Angio Cath:  20g  IV Site: L Antecubital  IV Started by:  Stanton Kidney, EMT-P  Chest Size (in):  34 Cup Size: B  Height: 5\' 5"  (1.651 m)  Weight:  141 lb (63.957 kg)  BMI:  Body mass index is 23.46 kg/(m^2). Tech Comments:  NA    Nuclear Med Study 1 or 2 day study: 1 day  Stress Test Type:  Stress  Reading MD: [name], MD  Order Authorizing Provider:  Charlynn Court, MD  Resting Radionuclide: Technetium 87m Tetrofosmin  Resting Radionuclide Dose: 11.0 mCi   Stress Radionuclide:  Technetium 88m Tetrofosmin  Stress Radionuclide Dose: 33.0 mCi           Stress Protocol Rest HR: 50 Stress HR: 129  Rest BP: 105/82 Stress BP: 136/97  Exercise Time (min): 9:00 METS: 10.1   Predicted Max HR: 146 bpm % Max HR: 88.36 bpm Rate Pressure Product: 16073   Dose of Adenosine (mg):  n/a Dose of Lexiscan: n/a mg  Dose of Atropine (mg): n/a Dose of Dobutamine: n/a mcg/kg/min (at max HR)  Stress Test Technologist: Smiley Houseman, CMA-N  Nuclear Technologist:  Domenic Polite, CNMT     Rest Procedure:  Myocardial perfusion imaging was performed at rest 45 minutes following the intravenous administration of Technetium 47m Tetrofosmin.  Rest ECG: Sinus bradycardia with marked sinus arrhythmia.  Stress Procedure:  The patient exercised  for nine minutes on the treadmill utilizing the Bruce protocol.  The patient stopped due to fatigue and denied any chest pain.  There were no diagnostic ST-T wave changes, only occasional PVC's and rare PAC's.  Technetium 39m Tetrofosmin was injected at peak exercise and myocardial perfusion imaging was performed after a brief delay.  Stress ECG: No significant change from baseline ECG   QPS Raw Data Images:  Normal; no motion artifact; normal heart/lung ratio. Stress Images:  Normal homogeneous uptake in all areas of the myocardium. Rest Images:  Normal homogeneous uptake in all areas of the myocardium. Subtraction (SDS):  No evidence of ischemia. Transient Ischemic Dilatation (Normal <1.22):  0.96 Lung/Heart Ratio (Normal <0.45):  0.17  Quantitative Gated Spect Images QGS EDV:  56 ml QGS ESV:  12 ml QGS cine images:  Normal Wall Motion QGS EF: 78%  Impression Exercise Capacity:  Good exercise capacity. BP Response:  Normal blood pressure response. Clinical Symptoms:  No chest pain. ECG Impression:  No significant ST segment change suggestive of ischemia. Comparison with Prior Nuclear Study: No previous nuclear study performed  Overall Impression:  Normal stress nuclear study.  [name]

## 2011-03-17 ENCOUNTER — Ambulatory Visit (INDEPENDENT_AMBULATORY_CARE_PROVIDER_SITE_OTHER): Payer: No Typology Code available for payment source | Admitting: Internal Medicine

## 2011-03-17 ENCOUNTER — Encounter: Payer: Self-pay | Admitting: Internal Medicine

## 2011-03-17 DIAGNOSIS — K219 Gastro-esophageal reflux disease without esophagitis: Secondary | ICD-10-CM

## 2011-03-17 DIAGNOSIS — E039 Hypothyroidism, unspecified: Secondary | ICD-10-CM

## 2011-03-17 DIAGNOSIS — I519 Heart disease, unspecified: Secondary | ICD-10-CM

## 2011-03-17 DIAGNOSIS — R590 Localized enlarged lymph nodes: Secondary | ICD-10-CM | POA: Insufficient documentation

## 2011-03-17 DIAGNOSIS — R599 Enlarged lymph nodes, unspecified: Secondary | ICD-10-CM

## 2011-03-17 DIAGNOSIS — I10 Essential (primary) hypertension: Secondary | ICD-10-CM

## 2011-03-17 DIAGNOSIS — R0789 Other chest pain: Secondary | ICD-10-CM

## 2011-03-17 MED ORDER — OMEPRAZOLE 40 MG PO CPDR: 40.0000 mg | DELAYED_RELEASE_CAPSULE | Freq: Every day | ORAL | Status: DC

## 2011-03-17 MED ORDER — AMLODIPINE BESYLATE 10 MG PO TABS: 10.0000 mg | ORAL_TABLET | Freq: Every day | ORAL | Status: DC

## 2011-03-17 MED ORDER — LEVOTHYROXINE SODIUM 100 MCG PO TABS: 100.0000 ug | ORAL_TABLET | Freq: Every day | ORAL | Status: DC

## 2011-03-17 NOTE — Assessment & Plan Note (Signed)
Asx. Negative nuclear stress test.

## 2011-03-17 NOTE — Assessment & Plan Note (Signed)
Low nl control with mild sx's associated. Decrease cozaar 50mg  po qd. Monitor bp and followup in clinic as scheduled or sooner if needed.

## 2011-03-17 NOTE — Assessment & Plan Note (Signed)
Empiric course of abx. If LN enlargement does not resolve then consider ent evaluation. Pt agrees to call with update at the end of abx course.

## 2011-03-17 NOTE — Progress Notes (Signed)
  Subjective:    Patient ID: [name], female    DOB: 18-Sep-1934, 76 y.o.   MRN: [id number]  HPI  Pt presents to clinic for followup of multiple medical problems. Reviewed nl nuclear stress test results since last visit. Tolerating norvasc 10mg  qd. Home bp's improved but with several SBP 102-103. Sometimes little dizzy. No presyncope or syncope. Notes several month h/o left lateral neck nodule. May be getting bigger. No associated pain or tenderness. Wt stable. No constitutional sx's. No other complaints.  Past Medical History  Diagnosis Date  . Thyroid disease     hypothyroid  . Anemia   . Osteoporosis   . Pleural effusion, bilateral     history of  . Pulmonary nodules     history of  . History of pancreatitis   . Cystocele     history with rectocele  . Scoliosis   . Varicose vein   . Routine general medical examination at a health care facility    Past Surgical History  Procedure Date  . Breast surgery 07/30/01    biopsy x 3  . Abdominal hysterectomy 04/29/04    partial    reports that she has never smoked. She has never used smokeless tobacco. She reports that she drinks alcohol. She reports that she does not use illicit drugs. family history includes Coronary artery disease in her other; Heart disease in her daughter; Lymphoma in her daughter; and Stroke in her other. Allergies  Allergen Reactions  . Codeine     REACTION: "makes her crazy" Hives  . Demerol   . Morphine And Related   . Sulfonamide Derivatives     REACTION: Rash  . Vicodin (Hydrocodone-Acetaminophen)      Review of Systems see hpi     Objective:   Physical Exam  Physical Exam  Nursing note and vitals reviewed. Constitutional: Appears well-developed and well-nourished. No distress.  HENT:  Head: Normocephalic and atraumatic.  Right Ear: External ear normal.  Left Ear: External ear normal.  Eyes: Conjunctivae are normal. No scleral icterus.  Neck: Neck supple. Carotid bruit is not  present. left lateral neck with ~1cm nodule firm, mobile and nontender. Cardiovascular: Normal rate, regular rhythm and normal heart sounds.  Exam reveals no gallop and no friction rub.   No murmur heard. Pulmonary/Chest: Effort normal and breath sounds normal. No respiratory distress. He has no wheezes. no rales.  Lymphadenopathy:    He has no cervical adenopathy.  Neurological:Alert.  Skin: Skin is warm and dry. Not diaphoretic.  Psychiatric: Has a normal mood and affect.        Assessment & Plan:

## 2011-03-25 ENCOUNTER — Other Ambulatory Visit: Payer: Self-pay | Admitting: Family

## 2011-03-25 ENCOUNTER — Other Ambulatory Visit: Payer: Self-pay | Admitting: *Deleted

## 2011-03-25 MED ORDER — LOSARTAN POTASSIUM 50 MG PO TABS: 50.0000 mg | ORAL_TABLET | Freq: Every day | ORAL | Status: DC

## 2011-03-25 NOTE — Telephone Encounter (Signed)
Please advise if ok to refill. 

## 2011-03-25 NOTE — Telephone Encounter (Signed)
Call received from pharmacy on patients behalf requesting refill for 90 day supply of Cozaar. Pharmacist informed Rx would be sent electronically.

## 2011-03-25 NOTE — Telephone Encounter (Signed)
#  30 rf 1 

## 2011-03-26 ENCOUNTER — Encounter: Payer: Self-pay | Admitting: *Deleted

## 2011-03-26 NOTE — Telephone Encounter (Signed)
Opened in error

## 2011-03-26 NOTE — Telephone Encounter (Signed)
Refill sent.

## 2011-03-30 ENCOUNTER — Telehealth: Payer: Self-pay | Admitting: *Deleted

## 2011-03-30 ENCOUNTER — Other Ambulatory Visit: Payer: Self-pay | Admitting: Internal Medicine

## 2011-03-30 DIAGNOSIS — R590 Localized enlarged lymph nodes: Secondary | ICD-10-CM

## 2011-03-30 NOTE — Telephone Encounter (Signed)
Patient called stating she has had problems with her blood pressure being low over the weekend. 104/64 &  96/54. She stated that she has had severe fatigue,and her heart feels like it is pounding. She denies chest pain, but states she could feel her heart beating. She also states that she has completed the antibiotic for the swollen lymph node in her neck, but the swelling is still present and there has not been any change.

## 2011-03-30 NOTE — Telephone Encounter (Signed)
Call returned to patient at [phone], no answer. A detailed voice message was left informing patient per Dr Rodena Medin instructions. Cozaar changed updated to reflect current dose of 25 mg. Message left informing patient she will be contacted by office regarding ENT referral.

## 2011-03-30 NOTE — Telephone Encounter (Signed)
Decrease cozaar dose to 25mg  po qd. Recommend ent consult for enlarged ln. Referral order placed.

## 2011-04-09 ENCOUNTER — Encounter: Payer: Self-pay | Admitting: Internal Medicine

## 2011-04-09 ENCOUNTER — Ambulatory Visit (INDEPENDENT_AMBULATORY_CARE_PROVIDER_SITE_OTHER): Payer: No Typology Code available for payment source | Admitting: Internal Medicine

## 2011-04-09 VITALS — BP 100/70 | HR 55 | Temp 97.8°F | Resp 16 | Wt 146.0 lb

## 2011-04-09 DIAGNOSIS — Z01818 Encounter for other preprocedural examination: Secondary | ICD-10-CM

## 2011-04-09 DIAGNOSIS — E785 Hyperlipidemia, unspecified: Secondary | ICD-10-CM

## 2011-04-09 DIAGNOSIS — Z79899 Other long term (current) drug therapy: Secondary | ICD-10-CM

## 2011-04-09 DIAGNOSIS — I10 Essential (primary) hypertension: Secondary | ICD-10-CM

## 2011-04-09 LAB — BASIC METABOLIC PANEL
Calcium: 9.3 mg/dL (ref 8.4–10.5)
Glucose, Bld: 82 mg/dL (ref 70–99)
Potassium: 4.2 mEq/L (ref 3.5–5.3)
Sodium: 139 mEq/L (ref 135–145)

## 2011-04-09 LAB — HEPATIC FUNCTION PANEL
ALT: 12 U/L (ref 0–35)
AST: 17 U/L (ref 0–37)
Bilirubin, Direct: 0.1 mg/dL (ref 0.0–0.3)
Indirect Bilirubin: 0.4 mg/dL (ref 0.0–0.9)
Total Protein: 6.6 g/dL (ref 6.0–8.3)

## 2011-04-09 LAB — LIPID PANEL
Cholesterol: 204 mg/dL — ABNORMAL HIGH (ref 0–200)
Total CHOL/HDL Ratio: 4.5 Ratio
Triglycerides: 115 mg/dL (ref <150)

## 2011-04-09 LAB — CBC
Hemoglobin: 13.5 g/dL (ref 12.0–15.0)
MCH: 31.8 pg (ref 26.0–34.0)
MCHC: 33.9 g/dL (ref 30.0–36.0)
RDW: 13.4 % (ref 11.5–15.5)

## 2011-04-09 NOTE — Progress Notes (Signed)
  Subjective:    Patient ID: [name], female    DOB: 1934-12-06, 76 y.o.   MRN: [id number]  HPI Pt presents to clinic for follow up of HTN and preoperative evaluation prior to ENT surgery. Notes recent generalized fatigue. BP log reviewed with range between 102/62-152/97. Average BP is normotensive. No known h/o CAD and reviewed recent nuclear stress test felt to be normal. No chronic lung disease. H/o mild hyperlipidemia not requiring medication and requests f/u lipid profile. No other complaints.  Past Medical History  Diagnosis Date  . Thyroid disease     hypothyroid  . Anemia   . Osteoporosis   . Pleural effusion, bilateral     history of  . Pulmonary nodules     history of  . History of pancreatitis   . Cystocele     history with rectocele  . Scoliosis   . Varicose vein   . Routine general medical examination at a health care facility    Past Surgical History  Procedure Date  . Breast surgery 07/30/01    biopsy x 3  . Abdominal hysterectomy 04/29/04    partial    reports that she has never smoked. She has never used smokeless tobacco. She reports that she drinks alcohol. She reports that she does not use illicit drugs. family history includes Coronary artery disease in her other; Heart disease in her daughter; Lymphoma in her daughter; and Stroke in her other. Allergies  Allergen Reactions  . Amoxicillin     Hives  . Codeine     REACTION: "makes her crazy" Hives  . Demerol   . Morphine And Related   . Sulfonamide Derivatives     REACTION: Rash  . Vicodin (Hydrocodone-Acetaminophen)        Review of Systems see hpi     Objective:   Physical Exam  Physical Exam  Nursing note and vitals reviewed. Constitutional: Appears well-developed and well-nourished. No distress.  HENT:  Head: Normocephalic and atraumatic.  Right Ear: External ear normal.  Left Ear: External ear normal.  Eyes: Conjunctivae are normal. No scleral icterus.  Neck: Neck supple.  Carotid bruit is not present.  Cardiovascular: Normal rate, regular rhythm and normal heart sounds.  Exam reveals no gallop and no friction rub.   No murmur heard. Pulmonary/Chest: Effort normal and breath sounds normal. No respiratory distress. He has no wheezes. no rales.  Lymphadenopathy:    He has no cervical adenopathy.  Neurological:Alert.  Skin: Skin is warm and dry. Not diaphoretic.  Psychiatric: Has a normal mood and affect.        Assessment & Plan:

## 2011-04-09 NOTE — Assessment & Plan Note (Signed)
Overall average control. Continue current regimen and monitor bp as outpt. No medical contraindication for pending ENT surgery. EKG obtained demonstrates sinus bradycardia with rate of 52 with nl intervals and axis. Obtain CBC, chem7.

## 2011-04-09 NOTE — Assessment & Plan Note (Signed)
Obtain lipid/lft. 

## 2011-04-20 ENCOUNTER — Other Ambulatory Visit: Payer: No Typology Code available for payment source

## 2011-06-05 ENCOUNTER — Other Ambulatory Visit (HOSPITAL_COMMUNITY): Payer: Self-pay | Admitting: *Deleted

## 2011-06-05 DIAGNOSIS — C8590 Non-Hodgkin lymphoma, unspecified, unspecified site: Secondary | ICD-10-CM

## 2011-06-12 ENCOUNTER — Encounter (HOSPITAL_COMMUNITY)
Admission: RE | Admit: 2011-06-12 | Discharge: 2011-06-12 | Disposition: A | Discharge status: Home / Self Care | Payer: No Typology Code available for payment source | Source: Ambulatory Visit | Attending: Internal Medicine | Admitting: Internal Medicine

## 2011-06-12 ENCOUNTER — Other Ambulatory Visit (HOSPITAL_COMMUNITY): Payer: Self-pay | Admitting: *Deleted

## 2011-06-12 DIAGNOSIS — M4120 Other idiopathic scoliosis, site unspecified: Secondary | ICD-10-CM | POA: Insufficient documentation

## 2011-06-12 DIAGNOSIS — C8590 Non-Hodgkin lymphoma, unspecified, unspecified site: Secondary | ICD-10-CM

## 2011-06-12 DIAGNOSIS — M519 Unspecified thoracic, thoracolumbar and lumbosacral intervertebral disc disorder: Secondary | ICD-10-CM | POA: Insufficient documentation

## 2011-06-12 DIAGNOSIS — C8589 Other specified types of non-Hodgkin lymphoma, extranodal and solid organ sites: Secondary | ICD-10-CM | POA: Insufficient documentation

## 2011-06-12 DIAGNOSIS — K449 Diaphragmatic hernia without obstruction or gangrene: Secondary | ICD-10-CM | POA: Insufficient documentation

## 2011-06-12 LAB — GLUCOSE, CAPILLARY: Glucose-Capillary: 88 mg/dL (ref 70–99)

## 2011-06-12 MED ORDER — FLUDEOXYGLUCOSE F - 18 (FDG) INJECTION
17.6000 | Freq: Once | INTRAVENOUS | Status: AC | PRN
  Administered 2011-06-12: 17.6 via INTRAVENOUS

## 2011-06-13 ENCOUNTER — Other Ambulatory Visit: Payer: Self-pay | Admitting: Internal Medicine

## 2011-06-16 NOTE — Telephone Encounter (Signed)
Yes same # rf 2

## 2011-06-16 NOTE — Telephone Encounter (Signed)
Rx refill sent to pharmacy. 

## 2011-07-03 ENCOUNTER — Telehealth: Payer: Self-pay | Admitting: *Deleted

## 2011-07-03 NOTE — Telephone Encounter (Signed)
Patient called and left voice message stating she had a pet scan done at [facility], and she wanted to know if Dr Rodena Medin had a chance to review the test results.  She would like to know what he advises for follow up if needed.

## 2011-07-05 NOTE — Telephone Encounter (Signed)
i am confused. She has been dx'ed with lymphoma and this pet scan was apparently ordered by her specialist in ? [name].

## 2011-07-06 NOTE — Telephone Encounter (Signed)
Call returned to patient at [phone], she was informed per Dr Rodena Medin instructions. She has requested appointment for evaluation this week. Appointment scheduled for Thursday 07/09/2011.

## 2011-07-06 NOTE — Telephone Encounter (Signed)
Non urgent appt to follow up other problems. (nl stress test 9/12)

## 2011-07-06 NOTE — Telephone Encounter (Signed)
Patient stated she has talked with her oncologist in [place] regarding the cancer part of the scan, however she was advised to follow up to have her hernia and heart problems addressed. Patient does not have a follow up scheduled in the future. Does she need to return to discuss options. She stated she wanted to know if she needed to be referred for treatment,and was not sure if she needed to schedule.

## 2011-07-09 ENCOUNTER — Encounter: Payer: Self-pay | Admitting: Internal Medicine

## 2011-07-09 ENCOUNTER — Ambulatory Visit (INDEPENDENT_AMBULATORY_CARE_PROVIDER_SITE_OTHER): Payer: No Typology Code available for payment source | Admitting: Internal Medicine

## 2011-07-09 DIAGNOSIS — C8589 Other specified types of non-Hodgkin lymphoma, extranodal and solid organ sites: Secondary | ICD-10-CM

## 2011-07-09 DIAGNOSIS — F411 Generalized anxiety disorder: Secondary | ICD-10-CM

## 2011-07-09 DIAGNOSIS — Z79899 Other long term (current) drug therapy: Secondary | ICD-10-CM

## 2011-07-09 DIAGNOSIS — E039 Hypothyroidism, unspecified: Secondary | ICD-10-CM

## 2011-07-09 DIAGNOSIS — I519 Heart disease, unspecified: Secondary | ICD-10-CM

## 2011-07-09 DIAGNOSIS — R0989 Other specified symptoms and signs involving the circulatory and respiratory systems: Secondary | ICD-10-CM

## 2011-07-09 DIAGNOSIS — C8590 Non-Hodgkin lymphoma, unspecified, unspecified site: Secondary | ICD-10-CM | POA: Insufficient documentation

## 2011-07-09 DIAGNOSIS — K219 Gastro-esophageal reflux disease without esophagitis: Secondary | ICD-10-CM

## 2011-07-09 DIAGNOSIS — R0600 Dyspnea, unspecified: Secondary | ICD-10-CM

## 2011-07-09 DIAGNOSIS — F419 Anxiety disorder, unspecified: Secondary | ICD-10-CM | POA: Insufficient documentation

## 2011-07-09 LAB — CBC WITH DIFFERENTIAL/PLATELET
Basophils Absolute: 0 10*3/uL (ref 0.0–0.1)
Eosinophils Relative: 12 % — ABNORMAL HIGH (ref 0–5)
HCT: 40.5 % (ref 36.0–46.0)
Hemoglobin: 13.1 g/dL (ref 12.0–15.0)
Lymphocytes Relative: 28 % (ref 12–46)
Lymphs Abs: 1.4 10*3/uL (ref 0.7–4.0)
MCV: 93.5 fL (ref 78.0–100.0)
Monocytes Absolute: 0.6 10*3/uL (ref 0.1–1.0)
Neutro Abs: 2.2 10*3/uL (ref 1.7–7.7)
RBC: 4.33 MIL/uL (ref 3.87–5.11)
RDW: 12.5 % (ref 11.5–15.5)
WBC: 4.8 10*3/uL (ref 4.0–10.5)

## 2011-07-09 LAB — BASIC METABOLIC PANEL
BUN: 14 mg/dL (ref 6–23)
CO2: 23 mEq/L (ref 19–32)
Chloride: 106 mEq/L (ref 96–112)
Creat: 0.79 mg/dL (ref 0.50–1.10)
Potassium: 4.3 mEq/L (ref 3.5–5.3)

## 2011-07-09 LAB — T4, FREE: Free T4: 1.48 ng/dL (ref 0.80–1.80)

## 2011-07-09 MED ORDER — ALPRAZOLAM 0.25 MG PO TABS: 0.2500 mg | ORAL_TABLET | Freq: Three times a day (TID) | ORAL | Status: AC | PRN

## 2011-07-09 MED ORDER — OMEPRAZOLE 40 MG PO CPDR: 40.0000 mg | DELAYED_RELEASE_CAPSULE | Freq: Two times a day (BID) | ORAL | Status: DC

## 2011-07-09 MED ORDER — AMITRIPTYLINE HCL 10 MG PO TABS: ORAL_TABLET | ORAL | Status: DC

## 2011-07-09 MED ORDER — LEVOTHYROXINE SODIUM 100 MCG PO TABS: 100.0000 ug | ORAL_TABLET | Freq: Every day | ORAL | Status: DC

## 2011-07-09 MED ORDER — TRAMADOL HCL 50 MG PO TABS: 50.0000 mg | ORAL_TABLET | Freq: Four times a day (QID) | ORAL | Status: DC | PRN

## 2011-07-09 NOTE — Assessment & Plan Note (Signed)
Attempt low dose xanax prn. Aware of potential for sedation, addiction and/or tolerance

## 2011-07-09 NOTE — Assessment & Plan Note (Signed)
Obtain tsh/free t4 

## 2011-07-09 NOTE — Assessment & Plan Note (Addendum)
Obtain BNP, cbc, chem7.. Schedule pulmonary consult

## 2011-07-09 NOTE — Progress Notes (Signed)
  Subjective:    Patient ID: [name], female    DOB: 1934-09-30, 77 y.o.   MRN: [id number]  HPI Pt presents to clinic for followup of multiple medical problems. Evaluation of cervical lymphadenopathy led to dx of NHL. S/p evaluation at [facility] as well as outside H/O. Has received dose of rituxan and is weighing options for further tx including CHOP vs BMT. Reviewed PET/CT results showing no obvious area suggestive of malignancy. Did suggest thyroid inflammation, large HH and aortic tortuosity. Complains of dyspnea and DOE without cp. Oncologist performed reportedly unremarkable CXR yesterday. Reviewed low risk nuclear stress test 2012. Also c/o anxiety since diagnosis and requests medication help. No other complaints.  Past Medical History  Diagnosis Date  . Thyroid disease     hypothyroid  . Anemia   . Osteoporosis   . Pleural effusion, bilateral     history of  . Pulmonary nodules     history of  . History of pancreatitis   . Cystocele     history with rectocele  . Scoliosis   . Varicose vein   . Routine general medical examination at a health care facility    Past Surgical History  Procedure Date  . Breast surgery 07/30/01    biopsy x 3  . Abdominal hysterectomy 04/29/04    partial    reports that she has never smoked. She has never used smokeless tobacco. She reports that she drinks alcohol. She reports that she does not use illicit drugs. family history includes Coronary artery disease in her other; Heart disease in her daughter; Lymphoma in her daughter; and Stroke in her other. Allergies  Allergen Reactions  . Amoxicillin     Hives  . Codeine     REACTION: "makes her crazy" Hives  . Demerol   . Morphine And Related   . Sulfonamide Derivatives     REACTION: Rash  . Vicodin (Hydrocodone-Acetaminophen)       Review of Systems see hpi     Objective:   Physical Exam  Nursing note and vitals reviewed. Constitutional: She appears well-developed and  well-nourished. No distress.  HENT:  Head: Normocephalic and atraumatic.  Right Ear: External ear normal.  Left Ear: External ear normal.  Eyes: Conjunctivae are normal. No scleral icterus.  Neck: Neck supple.  Cardiovascular: Normal rate, regular rhythm and normal heart sounds.   Pulmonary/Chest: Effort normal and breath sounds normal. No respiratory distress. She has no wheezes. She has no rales.  Neurological: She is alert.  Skin: Skin is warm and dry. She is not diaphoretic.  Psychiatric: She has a normal mood and affect.          Assessment & Plan:

## 2011-07-10 LAB — BRAIN NATRIURETIC PEPTIDE: Brain Natriuretic Peptide: 115.4 pg/mL — ABNORMAL HIGH (ref 0.0–100.0)

## 2011-07-16 ENCOUNTER — Encounter: Payer: Self-pay | Admitting: Critical Care Medicine

## 2011-07-16 ENCOUNTER — Ambulatory Visit (INDEPENDENT_AMBULATORY_CARE_PROVIDER_SITE_OTHER): Payer: No Typology Code available for payment source | Admitting: Critical Care Medicine

## 2011-07-16 VITALS — BP 136/82 | HR 67 | Temp 98.1°F | Ht 64.0 in | Wt 145.0 lb

## 2011-07-16 DIAGNOSIS — R0989 Other specified symptoms and signs involving the circulatory and respiratory systems: Secondary | ICD-10-CM

## 2011-07-16 DIAGNOSIS — R0600 Dyspnea, unspecified: Secondary | ICD-10-CM

## 2011-07-16 DIAGNOSIS — R0609 Other forms of dyspnea: Secondary | ICD-10-CM

## 2011-07-16 NOTE — Patient Instructions (Addendum)
No change in medications Focus on reflux diet Stay on omeprazole A pulmonary function test and 6 minute walk test will be obtained Return after PFTs completed

## 2011-07-16 NOTE — Assessment & Plan Note (Signed)
Chronic dyspnea on exertion likely multifactorial: Diastolic dysfunction Grade I on echo 2011. Mild pulmonary hypertension noted. Hiatal hernia with GERD No true primary lung disease This pt has been seen by this provider in the past, last time 2010 for similar issues and I have never discerned a primary pulmonary problem Plan PFTs/ 6 MW No med changes Reflux diet

## 2011-07-16 NOTE — Progress Notes (Signed)
Subjective:    Patient ID: [name], female    DOB: 11/18/1934, 77 y.o.   MRN: [id number]  HPI Comments: Dyspnea for several months  Shortness of Breath This is a new problem. The current episode started more than 1 month ago. The problem occurs every few minutes (exertional only, ok at rest.  If goes to a car has to rest). The problem has been unchanged. Associated symptoms include leg swelling. Pertinent negatives include no abdominal pain, chest pain, ear pain, fever, headaches, hemoptysis, leg pain, neck pain, orthopnea, PND, rash, rhinorrhea, sore throat, sputum production, vomiting or wheezing. The symptoms are aggravated by any activity and eating. She has tried nothing for the symptoms. There is no history of allergies, aspirin allergies, asthma, bronchiolitis, CAD, chronic lung disease, COPD, DVT, a heart failure, PE, pneumonia or a recent surgery.    Past Medical History  Diagnosis Date  . Thyroid disease     hypothyroid  . Anemia   . Osteoporosis   . Pleural effusion, bilateral     history of  . Pulmonary nodules     history of  . History of pancreatitis   . Cystocele     history with rectocele  . Scoliosis   . Varicose vein      Family History  Problem Relation Age of Onset  . Heart disease Daughter     congenital "whole in her heart"  . Stroke Other   . Coronary artery disease Other   . Lymphoma Daughter     nonhodgkins     History   Social History  . Marital Status: Widowed    Spouse Name: N/A    Number of Children: 3  . Years of Education: N/A   Occupational History  . self employed    Social History Main Topics  . Smoking status: Never Smoker   . Smokeless tobacco: Never Used  . Alcohol Use: Yes  . Drug Use: No  . Sexually Active: Not on file   Other Topics Concern  . Not on file   Social History Narrative  . No narrative on file     Allergies  Allergen Reactions  . Amoxicillin     Hives  . Codeine     REACTION: "makes her  crazy" Hives  . Demerol   . Morphine And Related   . Sulfonamide Derivatives     REACTION: Rash  . Vicodin (Hydrocodone-Acetaminophen)      Outpatient Prescriptions Prior to Visit  Medication Sig Dispense Refill  . ALPRAZolam (XANAX) 0.25 MG tablet Take 1 tablet (0.25 mg total) by mouth 3 (three) times daily as needed for sleep.  30 tablet  0  . amitriptyline (ELAVIL) 10 MG tablet Take 1/2 to 1 tablet by mouth at bedtime as needed.  30 tablet  3  . amLODipine (NORVASC) 10 MG tablet Take 1 tablet (10 mg total) by mouth daily.  90 tablet  2  . levothyroxine (SYNTHROID) 100 MCG tablet Take 1 tablet (100 mcg total) by mouth daily.  90 tablet  2  . losartan (COZAAR) 50 MG tablet Take 25 mg by mouth daily.        [name] omeprazole (PRILOSEC) 40 MG capsule Take 1 capsule (40 mg total) by mouth 2 (two) times daily. Take 1 capsule daily before eating.  180 capsule  2  . traMADol (ULTRAM) 50 MG tablet Take 1 tablet (50 mg total) by mouth every 6 (six) hours as needed for pain.  30 tablet  3  . denosumab (PROLIA) 60 MG/ML SOLN Inject 60 mg into the skin every 6 (six) months.           Review of Systems  Constitutional: Positive for fatigue and unexpected weight change. Negative for fever, chills, diaphoresis, activity change and appetite change.  HENT: Positive for voice change and tinnitus. Negative for hearing loss, ear pain, nosebleeds, congestion, sore throat, facial swelling, rhinorrhea, sneezing, mouth sores, trouble swallowing, neck pain, neck stiffness, dental problem, postnasal drip, sinus pressure and ear discharge.   Eyes: Positive for discharge. Negative for photophobia, itching and visual disturbance.  Respiratory: Positive for shortness of breath. Negative for apnea, cough, hemoptysis, sputum production, choking, chest tightness, wheezing and stridor.   Cardiovascular: Positive for palpitations and leg swelling. Negative for chest pain, orthopnea and PND.  Gastrointestinal: Negative for  nausea, vomiting, abdominal pain, constipation, blood in stool and abdominal distention.  Genitourinary: Negative for dysuria, urgency, frequency, hematuria, flank pain, decreased urine volume and difficulty urinating.  Musculoskeletal: Positive for back pain. Negative for myalgias, joint swelling, arthralgias and gait problem.  Skin: Negative for color change, pallor and rash.  Neurological: Positive for weakness. Negative for dizziness, tremors, seizures, syncope, speech difficulty, light-headedness, numbness and headaches.  Hematological: Negative for adenopathy. Does not bruise/bleed easily.  Psychiatric/Behavioral: Positive for agitation. Negative for confusion and sleep disturbance. The patient is not nervous/anxious.        Objective:   Physical Exam  Filed Vitals:   07/16/11 1202  BP: 136/82  Pulse: 67  Temp: 98.1 F (36.7 C)  TempSrc: Oral  Height: 5\' 4"  (1.626 m)  Weight: 65.772 kg (145 lb)  SpO2: 95%    Gen: Pleasant, well-nourished, in no distress,  normal affect  ENT: No lesions,  mouth clear,  oropharynx clear, no postnasal drip  Neck: No JVD, no TMG, no carotid bruits  Lungs: No use of accessory muscles, no dullness to percussion, clear without rales or rhonchi  Cardiovascular: RRR, heart sounds normal, no murmur or gallops, no peripheral edema  Abdomen: soft and NT, no HSM,  BS normal  Musculoskeletal: No deformities, no cyanosis or clubbing  Neuro: alert, non focal  Skin: Warm, no lesions or rashes  No results found.  CXR: CM.  No active disease      Assessment & Plan:   DYSPNEA ON EXERTION Chronic dyspnea on exertion likely multifactorial: Diastolic dysfunction Grade I on echo 2011. Mild pulmonary hypertension noted. Hiatal hernia with GERD No true primary lung disease This pt has been seen by this provider in the past, last time 2010 for similar issues and I have never discerned a primary pulmonary problem Plan PFTs/ 6 MW No med  changes Reflux diet    Updated Medication List Outpatient Encounter Prescriptions as of 07/16/2011  Medication Sig Dispense Refill  . ALPRAZolam (XANAX) 0.25 MG tablet Take 1 tablet (0.25 mg total) by mouth 3 (three) times daily as needed for sleep.  30 tablet  0  . amitriptyline (ELAVIL) 10 MG tablet Take 1/2 to 1 tablet by mouth at bedtime as needed.  30 tablet  3  . amLODipine (NORVASC) 10 MG tablet Take 1 tablet (10 mg total) by mouth daily.  90 tablet  2  . levothyroxine (SYNTHROID) 100 MCG tablet Take 1 tablet (100 mcg total) by mouth daily.  90 tablet  2  . losartan (COZAAR) 50 MG tablet Take 25 mg by mouth daily.        [name] omeprazole (PRILOSEC) 40 MG capsule  Take 1 capsule (40 mg total) by mouth 2 (two) times daily. Take 1 capsule daily before eating.  180 capsule  2  . traMADol (ULTRAM) 50 MG tablet Take 1 tablet (50 mg total) by mouth every 6 (six) hours as needed for pain.  30 tablet  3  . denosumab (PROLIA) 60 MG/ML SOLN Inject 60 mg into the skin every 6 (six) months.

## 2011-07-23 ENCOUNTER — Ambulatory Visit (INDEPENDENT_AMBULATORY_CARE_PROVIDER_SITE_OTHER): Payer: No Typology Code available for payment source | Admitting: Critical Care Medicine

## 2011-07-23 DIAGNOSIS — R0609 Other forms of dyspnea: Secondary | ICD-10-CM

## 2011-07-23 DIAGNOSIS — R0600 Dyspnea, unspecified: Secondary | ICD-10-CM

## 2011-07-23 LAB — PULMONARY FUNCTION TEST

## 2011-07-23 NOTE — Progress Notes (Signed)
PFT done today. 

## 2011-07-24 NOTE — Progress Notes (Signed)
 results noted

## 2011-07-27 ENCOUNTER — Ambulatory Visit: Payer: No Typology Code available for payment source | Admitting: Critical Care Medicine

## 2011-07-30 ENCOUNTER — Ambulatory Visit (INDEPENDENT_AMBULATORY_CARE_PROVIDER_SITE_OTHER): Payer: No Typology Code available for payment source | Admitting: Internal Medicine

## 2011-07-30 ENCOUNTER — Encounter: Payer: Self-pay | Admitting: Internal Medicine

## 2011-07-30 DIAGNOSIS — M810 Age-related osteoporosis without current pathological fracture: Secondary | ICD-10-CM

## 2011-07-30 DIAGNOSIS — F411 Generalized anxiety disorder: Secondary | ICD-10-CM

## 2011-07-30 DIAGNOSIS — F419 Anxiety disorder, unspecified: Secondary | ICD-10-CM

## 2011-08-03 ENCOUNTER — Encounter: Payer: Self-pay | Admitting: Critical Care Medicine

## 2011-08-08 NOTE — Progress Notes (Signed)
  Subjective:    Patient ID: [name], female    DOB: [birth year], 77 y.o.   MRN: [id number]  HPI Pt presents to clinic for followup of multiple medical problems. S/p pulmonary consult for DOE including pft and walk. Taking xanax 1/2 bid without sedation for anxiety. Needs to resume prolia for osteoporosis.   Past Medical History  Diagnosis Date  . Thyroid disease     hypothyroid  . Anemia   . Osteoporosis   . Pleural effusion, bilateral     history of  . Pulmonary nodules     history of  . History of pancreatitis   . Cystocele     history with rectocele  . Scoliosis   . Varicose vein    Past Surgical History  Procedure Date  . Breast surgery 07/30/01    biopsy x 3  . Abdominal hysterectomy 04/29/04    partial    reports that she has never smoked. She has never used smokeless tobacco. She reports that she drinks alcohol. She reports that she does not use illicit drugs. family history includes Coronary artery disease in her other; Heart disease in her daughter; Lymphoma in her daughter; and Stroke in her other. Allergies  Allergen Reactions  . Amoxicillin     Hives  . Codeine     REACTION: "makes her crazy" Hives  . Demerol   . Morphine And Related   . Sulfonamide Derivatives     REACTION: Rash  . Vicodin (Hydrocodone-Acetaminophen)       Review of Systems see hpi     Objective:   Physical Exam  Nursing note and vitals reviewed. Constitutional: She appears well-developed and well-nourished. No distress.  HENT:  Head: Normocephalic and atraumatic.  Right Ear: External ear normal.  Left Ear: External ear normal.  Eyes: Conjunctivae are normal. No scleral icterus.  Neck: No JVD present.  Cardiovascular: Normal rate, regular rhythm and normal heart sounds.  Exam reveals no gallop and no friction rub.   No murmur heard. Pulmonary/Chest: Effort normal and breath sounds normal. No respiratory distress. She has no wheezes. She has no rales.  Neurological:  She is alert.  Skin: Skin is warm and dry. She is not diaphoretic.  Psychiatric: She has a normal mood and affect.          Assessment & Plan:

## 2011-08-08 NOTE — Assessment & Plan Note (Signed)
Stable. Continue sparing use of xanax. Aware of potential for addiction/tolerance.

## 2011-08-08 NOTE — Assessment & Plan Note (Signed)
Order and resume prolia

## 2011-08-13 ENCOUNTER — Other Ambulatory Visit: Payer: Self-pay | Admitting: Internal Medicine

## 2011-08-13 NOTE — Telephone Encounter (Signed)
Verbal refill provided to pharmacist.  

## 2011-08-13 NOTE — Telephone Encounter (Signed)
yes

## 2011-08-20 ENCOUNTER — Other Ambulatory Visit: Payer: Self-pay | Admitting: Internal Medicine

## 2011-08-20 ENCOUNTER — Encounter: Payer: Self-pay | Admitting: Critical Care Medicine

## 2011-08-20 ENCOUNTER — Ambulatory Visit (INDEPENDENT_AMBULATORY_CARE_PROVIDER_SITE_OTHER): Payer: No Typology Code available for payment source | Admitting: Critical Care Medicine

## 2011-08-20 DIAGNOSIS — R0609 Other forms of dyspnea: Secondary | ICD-10-CM

## 2011-08-20 DIAGNOSIS — M810 Age-related osteoporosis without current pathological fracture: Secondary | ICD-10-CM

## 2011-08-20 DIAGNOSIS — I495 Sick sinus syndrome: Secondary | ICD-10-CM

## 2011-08-20 DIAGNOSIS — R001 Bradycardia, unspecified: Secondary | ICD-10-CM

## 2011-08-20 DIAGNOSIS — R0989 Other specified symptoms and signs involving the circulatory and respiratory systems: Secondary | ICD-10-CM

## 2011-08-20 NOTE — Telephone Encounter (Signed)
Last injection was given in April of 2012.

## 2011-08-20 NOTE — Assessment & Plan Note (Signed)
Sinus bradycardia, persistent and progressively slower resting HR Plan ?needs cardiology evaluation

## 2011-08-20 NOTE — Progress Notes (Signed)
Subjective:    Patient ID: [name], female    DOB: September 15, 1934, 77 y.o.   MRN: [id number]  HPI 76 y.o.WF  Dyspnea on exertion ?etiology  F/u dyspnea Dyspnea is better now .   Pulm w/u was normal Notes slow HR   Past Medical History  Diagnosis Date  . Thyroid disease     hypothyroid  . Anemia   . Osteoporosis   . Pleural effusion, bilateral     history of  . Pulmonary nodules     history of  . History of pancreatitis   . Cystocele     history with rectocele  . Scoliosis   . Varicose vein      Family History  Problem Relation Age of Onset  . Heart disease Daughter     congenital "whole in her heart"  . Stroke Other   . Coronary artery disease Other   . Lymphoma Daughter     nonhodgkins     History   Social History  . Marital Status: Widowed    Spouse Name: N/A    Number of Children: 3  . Years of Education: N/A   Occupational History  . self employed    Social History Main Topics  . Smoking status: Never Smoker   . Smokeless tobacco: Never Used  . Alcohol Use: Yes  . Drug Use: No  . Sexually Active: Not on file   Other Topics Concern  . Not on file   Social History Narrative  . No narrative on file     Allergies  Allergen Reactions  . Amoxicillin     Hives  . Codeine     REACTION: "makes her crazy" Hives  . Demerol   . Morphine And Related   . Sulfonamide Derivatives     REACTION: Rash  . Vicodin (Hydrocodone-Acetaminophen)      Outpatient Prescriptions Prior to Visit  Medication Sig Dispense Refill  . ALPRAZolam (XANAX) 0.25 MG tablet take 1 tablet by mouth three times a day if needed  30 tablet  1  . amitriptyline (ELAVIL) 10 MG tablet Take 1/2 to 1 tablet by mouth at bedtime as needed.  30 tablet  3  . amLODipine (NORVASC) 10 MG tablet Take 1 tablet (10 mg total) by mouth daily.  90 tablet  2  . denosumab (PROLIA) 60 MG/ML SOLN Inject 60 mg into the skin every 6 (six) months.        . levothyroxine (SYNTHROID) 100 MCG  tablet Take 1 tablet (100 mcg total) by mouth daily.  90 tablet  2  . losartan (COZAAR) 50 MG tablet Take 25 mg by mouth daily.        [name] omeprazole (PRILOSEC) 40 MG capsule Take 1 capsule (40 mg total) by mouth 2 (two) times daily. Take 1 capsule daily before eating.  180 capsule  2  . traMADol (ULTRAM) 50 MG tablet Take 1 tablet (50 mg total) by mouth every 6 (six) hours as needed for pain.  30 tablet  3       Review of Systems Constitutional:   No  weight loss, night sweats,  Fevers, chills, fatigue, lassitude. HEENT:   No headaches,  Difficulty swallowing,  Tooth/dental problems,  Sore throat,                No sneezing, itching, ear ache, nasal congestion, post nasal drip,   CV:  No chest pain,  Orthopnea, PND, swelling in lower extremities, anasarca, dizziness, palpitations  GI  No heartburn, indigestion, abdominal pain, nausea, vomiting, diarrhea, change in bowel habits, loss of appetite  Resp: No shortness of breath with exertion or at rest.  No excess mucus, no productive cough,  No non-productive cough,  No coughing up of blood.  No change in color of mucus.  No wheezing.  No chest wall deformity  Skin: no rash or lesions.  GU: no dysuria, change in color of urine, no urgency or frequency.  No flank pain.  MS:  No joint pain or swelling.  No decreased range of motion.  No back pain.  Psych:  No change in mood or affect. No depression or anxiety.  No memory loss.     Objective:   Physical Exam  Filed Vitals:   08/20/11 0936  BP: 126/68  Pulse: 42  Temp: 97.9 F (36.6 C)  TempSrc: Oral  Height: 5\' 4"  (1.626 m)  Weight: 145 lb (65.772 kg)  SpO2: 97%    Gen: Pleasant, well-nourished, in no distress,  normal affect  ENT: No lesions,  mouth clear,  oropharynx clear, no postnasal drip  Neck: No JVD, no TMG, no carotid bruits  Lungs: No use of accessory muscles, no dullness to percussion, clear without rales or rhonchi  Cardiovascular: RRR, heart sounds normal, no  murmur or gallops, no peripheral edema  Abdomen: soft and NT, no HSM,  BS normal  Musculoskeletal: No deformities, no cyanosis or clubbing  Neuro: alert, non focal  Skin: Warm, no lesions or rashes   PFT Conversion 08/03/2011 12/02/2008  FVC 2.87 2.88  FVC PREDICT 2.71 2.78  FVC  % Predicted 106 103  FEV1 2.13 2.13  FEV1 PREDICT 1.89 1.96  FEV % Predicted 113 108  FEV1/FVC 74.2 74  FEV1/FVC PRE 71 71  FeF 25-75 1.57 1.56  FeF 25-75 % Predicted 73 2.23  FEF % EXPEC  70  POST FVC  3.24  FVCPRDPST  2.78  POST FVC%EXP  121  POST FEV1  2.37  FEV1PRDPST  1.96  POSTFEV1%PRD  121  PSTFEV1/FVC  73  FEV1FVCPRDPS  71  PSTFEF25/75%  1.70  PSTFEF25/75P  2.23  FEF2575%EXPS  76  Residual Volume (ml) 2 1.80  RV % EXPECT 101 92  DLCO (ml/mmHg sec) 11.7 12.3  DLCO % EXPEC 64 67  DLCO/VA 3.12 3.36  DLCO/VA%EXP 92 97        Assessment & Plan:   DYSPNEA ON EXERTION Dyspnea with negative pulmonary workup normal, no desat PFTs normal Bradycardia is sinus bradycardia ? Etiology? Plan  May benefit from cardiology referral No further pulmonary w/u or Rx Rov prn  Sinus bradycardia, chronic Sinus bradycardia, persistent and progressively slower resting HR Plan ?needs cardiology evaluation     Updated Medication List Outpatient Encounter Prescriptions as of 08/20/2011  Medication Sig Dispense Refill  . ALPRAZolam (XANAX) 0.25 MG tablet take 1 tablet by mouth three times a day if needed  30 tablet  1  . amitriptyline (ELAVIL) 10 MG tablet Take 1/2 to 1 tablet by mouth at bedtime as needed.  30 tablet  3  . amLODipine (NORVASC) 10 MG tablet Take 1 tablet (10 mg total) by mouth daily.  90 tablet  2  . denosumab (PROLIA) 60 MG/ML SOLN Inject 60 mg into the skin every 6 (six) months.        . levothyroxine (SYNTHROID) 100 MCG tablet Take 1 tablet (100 mcg total) by mouth daily.  90 tablet  2  . losartan (COZAAR) 50 MG tablet  Take 25 mg by mouth daily.        [name] omeprazole  (PRILOSEC) 40 MG capsule Take 1 capsule (40 mg total) by mouth 2 (two) times daily. Take 1 capsule daily before eating.  180 capsule  2  . traMADol (ULTRAM) 50 MG tablet Take 1 tablet (50 mg total) by mouth every 6 (six) hours as needed for pain.  30 tablet  3

## 2011-08-20 NOTE — Assessment & Plan Note (Signed)
Dyspnea with negative pulmonary workup normal, no desat PFTs normal Bradycardia is sinus bradycardia ? Etiology? Plan  May benefit from cardiology referral No further pulmonary w/u or Rx Rov prn

## 2011-08-20 NOTE — Telephone Encounter (Signed)
Follow up with patient regarding Prolia injection

## 2011-08-20 NOTE — Patient Instructions (Signed)
No primary lung disease I will ask Dr Rodena Medin to look into a referral to Cardiology Return as needed

## 2011-08-21 ENCOUNTER — Other Ambulatory Visit: Payer: Self-pay | Admitting: Internal Medicine

## 2011-08-21 ENCOUNTER — Other Ambulatory Visit: Payer: Self-pay

## 2011-08-21 ENCOUNTER — Encounter (HOSPITAL_COMMUNITY): Payer: Self-pay

## 2011-08-21 ENCOUNTER — Telehealth: Payer: Self-pay | Admitting: *Deleted

## 2011-08-21 ENCOUNTER — Emergency Department (HOSPITAL_COMMUNITY): Payer: No Typology Code available for payment source

## 2011-08-21 ENCOUNTER — Emergency Department (HOSPITAL_COMMUNITY)
Admission: EM | Admit: 2011-08-21 | Discharge: 2011-08-21 | Disposition: A | Discharge status: Home / Self Care | Payer: No Typology Code available for payment source | Attending: Emergency Medicine | Admitting: Emergency Medicine

## 2011-08-21 DIAGNOSIS — Z87898 Personal history of other specified conditions: Secondary | ICD-10-CM | POA: Insufficient documentation

## 2011-08-21 DIAGNOSIS — I10 Essential (primary) hypertension: Secondary | ICD-10-CM | POA: Diagnosis present

## 2011-08-21 DIAGNOSIS — R5381 Other malaise: Secondary | ICD-10-CM | POA: Insufficient documentation

## 2011-08-21 DIAGNOSIS — C8590 Non-Hodgkin lymphoma, unspecified, unspecified site: Secondary | ICD-10-CM | POA: Diagnosis present

## 2011-08-21 DIAGNOSIS — R0602 Shortness of breath: Secondary | ICD-10-CM | POA: Insufficient documentation

## 2011-08-21 DIAGNOSIS — M419 Scoliosis, unspecified: Secondary | ICD-10-CM | POA: Diagnosis present

## 2011-08-21 DIAGNOSIS — R001 Bradycardia, unspecified: Secondary | ICD-10-CM | POA: Diagnosis present

## 2011-08-21 DIAGNOSIS — R5383 Other fatigue: Secondary | ICD-10-CM | POA: Diagnosis present

## 2011-08-21 DIAGNOSIS — M4120 Other idiopathic scoliosis, site unspecified: Secondary | ICD-10-CM | POA: Insufficient documentation

## 2011-08-21 DIAGNOSIS — R0609 Other forms of dyspnea: Secondary | ICD-10-CM | POA: Diagnosis present

## 2011-08-21 DIAGNOSIS — K449 Diaphragmatic hernia without obstruction or gangrene: Secondary | ICD-10-CM | POA: Diagnosis present

## 2011-08-21 DIAGNOSIS — M810 Age-related osteoporosis without current pathological fracture: Secondary | ICD-10-CM | POA: Insufficient documentation

## 2011-08-21 DIAGNOSIS — E785 Hyperlipidemia, unspecified: Secondary | ICD-10-CM | POA: Diagnosis present

## 2011-08-21 DIAGNOSIS — I498 Other specified cardiac arrhythmias: Secondary | ICD-10-CM | POA: Insufficient documentation

## 2011-08-21 DIAGNOSIS — E039 Hypothyroidism, unspecified: Secondary | ICD-10-CM | POA: Diagnosis present

## 2011-08-21 DIAGNOSIS — E162 Hypoglycemia, unspecified: Secondary | ICD-10-CM | POA: Insufficient documentation

## 2011-08-21 DIAGNOSIS — Z9079 Acquired absence of other genital organ(s): Secondary | ICD-10-CM | POA: Insufficient documentation

## 2011-08-21 DIAGNOSIS — I519 Heart disease, unspecified: Secondary | ICD-10-CM | POA: Diagnosis present

## 2011-08-21 HISTORY — DX: Non-Hodgkin lymphoma, unspecified, unspecified site: C85.90

## 2011-08-21 LAB — POCT I-STAT, CHEM 8
Calcium, Ion: 1.18 mmol/L (ref 1.12–1.32)
HCT: 39 % (ref 36.0–46.0)
Sodium: 144 mEq/L (ref 135–145)
TCO2: 23 mmol/L (ref 0–100)

## 2011-08-21 LAB — CBC
MCH: 30.2 pg (ref 26.0–34.0)
MCV: 89.4 fL (ref 78.0–100.0)
Platelets: 207 10*3/uL (ref 150–400)
RDW: 13.5 % (ref 11.5–15.5)
WBC: 4.6 10*3/uL (ref 4.0–10.5)

## 2011-08-21 MED ORDER — DENOSUMAB 60 MG/ML ~~LOC~~ SOLN: 60.0000 mg | SUBCUTANEOUS | Status: DC

## 2011-08-21 NOTE — ED Notes (Signed)
Cardiology at bedside.

## 2011-08-21 NOTE — ED Notes (Signed)
Pt ambulated to bathroom with no difficulties, denies SOB/CP

## 2011-08-21 NOTE — ED Provider Notes (Signed)
History     CSN: [id number]  Arrival date & time 08/21/11  1229   First MD Initiated Contact with Patient 08/21/11 1252      Chief Complaint  Patient presents with  . Shortness of Breath    (Consider location/radiation/quality/duration/timing/severity/associated sxs/prior treatment) HPI Patient has complained of shortness of breath for approximately one month seen multiple times by Dr.Wright for same complaint released back to her primary care Dr., Merwyn Katos, in [place] patient had normal PFTs and did not desaturate. Per Dr. Delford Field feeling was that patient may benefit from cardiology evaluation. Patient reports she called Dr. Rodena Medin this morning, feeling lightheaded heart rate taken by patient was 38. She is presently asymptomatic without treatment Past Medical History  Diagnosis Date  . Thyroid disease     hypothyroid  . Anemia   . Osteoporosis   . Pleural effusion, bilateral     history of  . Pulmonary nodules     history of  . History of pancreatitis   . Cystocele     history with rectocele  . Scoliosis   . Varicose vein    non-Hodgkin's lymphoma  Past Surgical History  Procedure Date  . Breast surgery 07/30/01    biopsy x 3  . Abdominal hysterectomy 04/29/04    partial    Family History  Problem Relation Age of Onset  . Heart disease Daughter     congenital "whole in her heart"  . Stroke Other   . Coronary artery disease Other   . Lymphoma Daughter     nonhodgkins    History  Substance Use Topics  . Smoking status: Never Smoker   . Smokeless tobacco: Never Used  . Alcohol Use: Yes    OB History    Grav Para Term Preterm Abortions TAB SAB Ect Mult Living                  Review of Systems  Constitutional: Positive for fatigue.  HENT: Negative.   Respiratory: Positive for shortness of breath.   Cardiovascular: Negative.   Gastrointestinal: Negative.   Musculoskeletal: Negative.   Skin: Negative.   Neurological: Negative.   Hematological:  Negative.   Psychiatric/Behavioral: Negative.   All other systems reviewed and are negative.    Allergies  Amoxicillin; Codeine; Demerol; Morphine and related; Sulfonamide derivatives; and Vicodin  Home Medications   Current Outpatient Rx  Name Route Sig Dispense Refill  . ALPRAZOLAM 0.25 MG PO TABS Oral Take 0.25 mg by mouth 3 (three) times daily as needed. For anxiety    . AMLODIPINE BESYLATE 10 MG PO TABS Oral Take 10 mg by mouth daily.    . DENOSUMAB 60 MG/ML [place] SOLN Subcutaneous Inject 60 mg into the skin every 6 (six) months. Administer in upper arm, thigh, or abdomen    . LEVOTHYROXINE SODIUM 100 MCG PO TABS Oral Take 100 mcg by mouth daily.    [name] LOSARTAN POTASSIUM 50 MG PO TABS Oral Take 25 mg by mouth daily.     [name] OMEPRAZOLE 40 MG PO CPDR Oral Take 40 mg by mouth 2 (two) times daily with a meal.    . TRAMADOL HCL 50 MG PO TABS Oral Take 50 mg by mouth every 6 (six) hours as needed. pain      BP 117/99  Pulse 50  Temp(Src) 98.1 F (36.7 C) (Oral)  Resp 18  Ht 5\' 4"  (1.626 m)  Wt 143 lb (64.864 kg)  BMI 24.55 kg/m2  SpO2 96%  Physical Exam  Nursing note and vitals reviewed. Constitutional: She appears well-developed and well-nourished.  HENT:  Head: Normocephalic and atraumatic.  Eyes: Conjunctivae are normal. Pupils are equal, round, and reactive to light.  Neck: Neck supple. No tracheal deviation present. No thyromegaly present.  Cardiovascular: Regular rhythm.   No murmur heard.      Bradycardia  Pulmonary/Chest: Effort normal and breath sounds normal.  Abdominal: Soft. Bowel sounds are normal. She exhibits no distension. There is no tenderness.  Musculoskeletal: Normal range of motion. She exhibits no edema and no tenderness.  Neurological: She is alert. Coordination normal.  Skin: Skin is warm and dry. No rash noted.  Psychiatric: She has a normal mood and affect.    ED Course  Procedures (including critical care time)  Labs Reviewed - No data to  display No results found.  Date: 08/21/2011  Rate: 50  Rhythm: sinus bradycardia  QRS Axis: normal  Intervals: normal  ST/T Wave abnormalities: nonspecific T wave changes  Conduction Disutrbances:none  Narrative Interpretation:   Old EKG Reviewed: unchanged Previous tracing from 04/03/2009 showed sinus bradycardia 58 beats per minute otherwise unchanged  No diagnosis found.  3:20 PM patient asymptomatic Dr. Rennis Golden came to evaluate patient in the emergency department and arrange for outpatient cardiac monitor. He feels that patient may be candidate for pacemaker  MDM  Patient with mild hypoglycemia, asymptomatic Patient to outpatient cardiac monitor arranged by dr hilty     And to follow up with Dr.Hilty Diagnosis bradycardia        Doug Sou, MD 08/21/11 1527

## 2011-08-21 NOTE — H&P (Signed)
Pt. Seen and examined. Agree with the NP/PA-C note as written.  Possibly symptomatic bradycardia.  She has had low heart rate for several years. Mother has a pacemaker.  She was initially diagnosed with mantle cell CA, but then re-diagnosed with NHL.  Doesn't sound like she is undergoing active treatment.  Saw Dr. Delford Field yesterday, but unclear if she mentioned she has cancer too him.  Regardless, she is bradycardic and reports that her heart rate did not increase with activity at home, suggesting chronotropic incompetence.  I would recommend a 2 week monitor and follow-up with me in the office.  Ok for discharge today.  Chrystie Nose, MD, [facility] Attending Cardiologist [facility]

## 2011-08-21 NOTE — Discharge Instructions (Signed)
Bradycardia Bradycardia is a term for a heart rate (pulse) that, in adults, is slower than 60 beats per minute. A normal rate is 60 to 100 beats per minute. A heart rate below 60 beats per minute may be normal for some adults with healthy hearts. If the rate is too slow, the heart may have trouble pumping the volume of blood the body needs. If the heart rate gets too low, blood flow to the brain may be decreased and may make you feel lightheaded, dizzy, or faint. The heart has a natural pacemaker in the top of the heart called the SA node (sinoatrial or sinus node). This pacemaker sends out regular electrical signals to the muscle of the heart, telling the heart muscle when to beat (contract). The electrical signal travels from the upper parts of the heart (atria) through the AV node (atrioventricular node), to the lower chambers of the heart (ventricles). The ventricles squeeze, pumping the blood from your heart to your lungs and to the rest of your body. CAUSES   Problem with the heart's electrical system.   Problem with the heart's natural pacemaker.   Heart disease, damage, or infection.   Medications.   Problems with minerals and salts (electrolytes).  SYMPTOMS   Fainting (syncope).   Fatigue and weakness.   Shortness of breath (dyspnea).   Chest pain (angina).   Drowsiness.   Confusion.  DIAGNOSIS   An electrocardiogram (ECG) can help your caregiver determine the type of slow heart rate you have.   If the cause is not seen on an ECG, you may need to wear a heart monitor that records your heart rhythm for several hours or days.   Blood tests.  TREATMENT   Electrolyte supplements.   Medications.   Withholding medication which is causing a slow heart rate.   Pacemaker placement.  SEEK IMMEDIATE MEDICAL CARE IF:   You feel lightheaded or faint.   You develop an irregular heart rate.   You feel chest pain or have trouble breathing.  MAKE SURE YOU:   Understand  these instructions.   Will watch your condition.   Will get help right away if you are not doing well or get worse.  Document Released: 03/07/2002 Document Revised: 02/25/2011 Document Reviewed: 02/01/2008 [facility] Patient Information 2012 [place], [place].    Dr. Rennis Golden has arranged for a cardiac monitor to be delivered to your house. Call his office in 3 days to arrange for followup. Return if you feel worse for any reason. Avoid vigorous exercise and driving until you have follow up

## 2011-08-21 NOTE — Telephone Encounter (Signed)
Call placed to patient at [phone], she was informed last Prolia injection was given in April of 2012. She would like to proceed with having the injection. She was informed Rx would be sent to pharmacy.

## 2011-08-21 NOTE — ED Notes (Signed)
Shortness of breath  Intermittent for months, pt. Denies any chest pain Pt. Had a lung test this week, and they told her that her HR  Was low and  Her lungs were fine

## 2011-08-21 NOTE — H&P (Signed)
Patient ID: [name] MRN: [id number], DOB/AGE: Jul 21, 1934   Admit date: 08/21/2011   Primary Physician: Letitia Libra, Ala Dach, MD, MD Primary Cardiologist: Dr Rennis Golden (new)  HPI: Patient is a 77 year old female with no prior history of coronary disease. Reviewing her primary care records, she always has some degree of bradycardia with a heart rate of 55-65. She's had problems recently with fatigue and dyspnea on exertion. She was referred to a pulmonologist, Dr. Danise Mina. Apparently pulmonary workup was unremarkable. Dr. Delford Field did note a slow heart rate. The patient was referred back to her primary care doctor for referral to a cardiologist. This morning she talked to her family doctor and noted that her heart rate was 35. He suggested she come to the emergency room. In the emergency room her heart rate in the low 40s. He denies any chest pain. She denies any syncope. Had any tachycardia. He's admitted now for further evaluation.   Problem List: Past Medical History  Diagnosis Date  . Thyroid disease     hypothyroid  . Anemia   . Osteoporosis   . Pleural effusion, bilateral     history of  . Pulmonary nodules     history of  . History of pancreatitis   . Cystocele     history with rectocele  . Scoliosis   . Varicose vein     Past Surgical History  Procedure Date  . Breast surgery 07/30/01    biopsy x 3  . Abdominal hysterectomy 04/29/04    partial     Allergies:  Allergies  Allergen Reactions  . Amoxicillin     Hives  . Codeine     REACTION: "makes her crazy" Hives  . Demerol   . Morphine And Related   . Sulfonamide Derivatives     REACTION: Rash  . Vicodin (Hydrocodone-Acetaminophen)      Home Medications  (Not in a hospital admission) ALPRAZolam (XANAX) 0.25 MG tablet  take 1 tablet by mouth three times a day if needed  30 tablet  1  .  amitriptyline (ELAVIL) 10 MG tablet  Take 1/2 to 1 tablet by mouth at bedtime as needed.  30 tablet  3  .    amLODipine (NORVASC) 10 MG tablet  Take 1 tablet (10 mg total) by mouth daily.  90 tablet  2  .  denosumab (PROLIA) 60 MG/ML SOLN  Inject 60 mg into the skin every 6 (six) months.  .  levothyroxine (SYNTHROID) 100 MCG tablet  Take 1 tablet (100 mcg total) by mouth daily.  90 tablet  2  .  losartan (COZAAR) 50 MG tablet  Take 25 mg by mouth daily.  [name]  omeprazole (PRILOSEC) 40 MG capsule  Take 1 capsule (40 mg total) by mouth 2 (two) times daily. Take 1 capsule daily before eating.  180 capsule  2  .  traMADol (ULTRAM) 50 MG tablet  Take 1 tablet (50 mg total) by mouth every 6 (six) hours as needed for pain.  30 tablet     Family History  Problem Relation Age of Onset  . Heart disease Daughter     congenital "whole in her heart"  . Stroke Other   . Coronary artery disease Other   . Lymphoma Daughter     nonhodgkins     History   Social History  . Marital Status: Widowed    Spouse Name: N/A    Number of Children: 3  . Years of Education: N/A  Occupational History  . self employed    Social History Main Topics  . Smoking status: Never Smoker   . Smokeless tobacco: Never Used  . Alcohol Use: Yes  . Drug Use: No  . Sexually Active: Not on file   Other Topics Concern  . Not on file   Social History Narrative  . No narrative on file     Review of Systems: No syncope, no chest pain, no palpitations. Non Hodgkins Lymphoma recently diagnosed, w/u at [facility].   Physical Exam: Blood pressure 137/72, pulse 45, temperature 98.1 F (36.7 C), temperature source Oral, resp. rate 18, height 5\' 4"  (1.626 m), weight 64.864 kg (143 lb), SpO2 98.00%.  General appearance: alert, cooperative and no distress Neck: no adenopathy, no carotid bruit, no JVD, supple, symmetrical, trachea midline and thyroid not enlarged, symmetric, no tenderness/mass/nodules Lungs:crackles Lt base and severe scoliosis Heart: regular rate and rhythm Abdomen: soft, non-tender; bowel sounds  normal; no masses,  no organomegaly Extremities: extremities normal, atraumatic, no cyanosis or edema Pulses: 2+ and symmetric Skin: Skin color, texture, turgor normal. No rashes or lesions Neurologic: Grossly normal    Labs:   Results for orders placed during the hospital encounter of 08/21/11 (from the past 24 hour(s))  CBC     Status: Normal   Collection Time   08/21/11  1:13 PM      Component Value Range   WBC 4.6  4.0 - 10.5 (K/uL)   RBC 4.34  3.87 - 5.11 (MIL/uL)   Hemoglobin 13.1  12.0 - 15.0 (g/dL)   HCT 96.0  45.4 - 09.8 (%)   MCV 89.4  78.0 - 100.0 (fL)   MCH 30.2  26.0 - 34.0 (pg)   MCHC 33.8  30.0 - 36.0 (g/dL)   RDW 11.9  14.7 - 82.9 (%)   Platelets 207  150 - 400 (K/uL)  POCT I-STAT, CHEM 8     Status: Abnormal   Collection Time   08/21/11  1:42 PM      Component Value Range   Sodium 144  135 - 145 (mEq/L)   Potassium 3.5  3.5 - 5.1 (mEq/L)   Chloride 111  96 - 112 (mEq/L)   BUN 13  6 - 23 (mg/dL)   Creatinine, Ser 5.62  0.50 - 1.10 (mg/dL)   Glucose, Bld 53 (*) 70 - 99 (mg/dL)   Calcium, Ion 1.30  8.65 - 1.32 (mmol/L)   TCO2 23  0 - 100 (mmol/L)   Hemoglobin 13.3  12.0 - 15.0 (g/dL)   HCT 78.4  69.6 - 29.5 (%)     Radiology/Studies: Dg Chest Port 1 View  08/21/2011  *RADIOLOGY REPORT*  Clinical Data: Short of breath, weakness  PORTABLE CHEST - 1 VIEW  Comparison: Chest x-ray of 10/30/2008  Findings: Opacity at the left lung base appears to correspond to a moderate to large hiatal hernia.  Mild basilar atelectasis is present.  Cardiomegaly is stable.  Moderate thoracolumbar scoliosis is noted.  IMPRESSION: Large hiatal hernia.  Stable cardiomegaly.  Thoracolumbar scoliosis.  Original Report Authenticated By: Juline Patch, M.D.    EKG:NSR SB  ASSESSMENT AND PLAN:   Principal Problem:  *DYSPNEA ON EXERTION  Active Problems:  Sinus bradycardia, baselin HR 55-65, now low 40's  Fatigue  HYPOTHYROIDISM, TSH  1.24 Jul 2011  DIASTOLIC DYSFUNCTION, on 2D Jan  2011 (grade 1)  Hypertension  NHL (non-Hodgkin's lymphoma)  Dyslipidemia, LDL 136  Hiatal hernia, large by CXR  Scoliosis  Plan- MD  to see.   [name], PA-C 08/21/2011, 3:24 PM

## 2011-08-21 NOTE — Telephone Encounter (Signed)
Patient called and left voice message to report her pulse this morning is 38.   Call was returned to patient at [phone] Patient informed since she has reported earlier with signs of dizziness, a sense of feeling as if she was going to pass out, and pulse no higher than 42; she was advised to be evaluated in ER per Dr Rodena Medin instructions. She was informed that it appears her pulse is continuing to drop she should seek care in the emergency room. Patient has verbalized understanding and states she will go to [facility] ER.

## 2011-09-08 ENCOUNTER — Other Ambulatory Visit: Payer: Self-pay | Admitting: Internal Medicine

## 2011-09-10 ENCOUNTER — Encounter (HOSPITAL_COMMUNITY): Payer: Self-pay | Admitting: Respiratory Therapy

## 2011-09-14 MED ORDER — VANCOMYCIN HCL IN DEXTROSE 1-5 GM/200ML-% IV SOLN
1000.0000 mg | INTRAVENOUS | Status: AC
  Filled 2011-09-14 (×2): qty 200

## 2011-09-14 MED ORDER — SODIUM CHLORIDE 0.9 % IR SOLN
80.0000 mg | Status: DC
  Filled 2011-09-14: qty 2

## 2011-09-15 ENCOUNTER — Encounter (HOSPITAL_COMMUNITY): Payer: Self-pay

## 2011-09-15 ENCOUNTER — Ambulatory Visit (HOSPITAL_COMMUNITY)
Admission: RE | Admit: 2011-09-15 | Discharge: 2011-09-16 | Disposition: A | Discharge status: Home / Self Care | Payer: No Typology Code available for payment source | Source: Ambulatory Visit | Attending: Cardiovascular Disease | Admitting: Cardiovascular Disease

## 2011-09-15 ENCOUNTER — Encounter (HOSPITAL_COMMUNITY)
Admission: RE | Disposition: A | Discharge status: Home / Self Care | Payer: Self-pay | Source: Ambulatory Visit | Attending: Cardiovascular Disease

## 2011-09-15 DIAGNOSIS — R0609 Other forms of dyspnea: Secondary | ICD-10-CM | POA: Diagnosis present

## 2011-09-15 DIAGNOSIS — I519 Heart disease, unspecified: Secondary | ICD-10-CM | POA: Diagnosis present

## 2011-09-15 DIAGNOSIS — I10 Essential (primary) hypertension: Secondary | ICD-10-CM | POA: Diagnosis present

## 2011-09-15 DIAGNOSIS — R001 Bradycardia, unspecified: Secondary | ICD-10-CM | POA: Diagnosis present

## 2011-09-15 DIAGNOSIS — C8590 Non-Hodgkin lymphoma, unspecified, unspecified site: Secondary | ICD-10-CM | POA: Diagnosis present

## 2011-09-15 DIAGNOSIS — Z950 Presence of cardiac pacemaker: Secondary | ICD-10-CM

## 2011-09-15 DIAGNOSIS — I495 Sick sinus syndrome: Secondary | ICD-10-CM | POA: Insufficient documentation

## 2011-09-15 DIAGNOSIS — R5383 Other fatigue: Secondary | ICD-10-CM | POA: Diagnosis present

## 2011-09-15 DIAGNOSIS — E039 Hypothyroidism, unspecified: Secondary | ICD-10-CM | POA: Diagnosis present

## 2011-09-15 HISTORY — PX: PERMANENT PACEMAKER INSERTION: SHX5480

## 2011-09-15 HISTORY — DX: Presence of cardiac pacemaker: Z95.0

## 2011-09-15 LAB — SURGICAL PCR SCREEN: MRSA, PCR: NEGATIVE

## 2011-09-15 SURGERY — PERMANENT PACEMAKER INSERTION
Anesthesia: LOCAL

## 2011-09-15 MED ORDER — LEVOTHYROXINE SODIUM 100 MCG PO TABS
100.0000 ug | ORAL_TABLET | Freq: Every day | ORAL | Status: DC
  Administered 2011-09-16: 100 ug via ORAL
  Filled 2011-09-15 (×2): qty 1

## 2011-09-15 MED ORDER — LIDOCAINE HCL (PF) 1 % IJ SOLN
INTRAMUSCULAR | Status: AC
  Filled 2011-09-15: qty 60

## 2011-09-15 MED ORDER — MIDAZOLAM HCL 2 MG/2ML IJ SOLN
INTRAMUSCULAR | Status: AC
  Filled 2011-09-15: qty 2

## 2011-09-15 MED ORDER — AMLODIPINE BESYLATE 10 MG PO TABS
10.0000 mg | ORAL_TABLET | Freq: Every day | ORAL | Status: DC
  Administered 2011-09-16: 10 mg via ORAL
  Filled 2011-09-15 (×2): qty 1

## 2011-09-15 MED ORDER — HEPARIN (PORCINE) IN NACL 2-0.9 UNIT/ML-% IJ SOLN
INTRAMUSCULAR | Status: AC
  Filled 2011-09-15: qty 1000

## 2011-09-15 MED ORDER — ALPRAZOLAM 0.25 MG PO TABS
0.2500 mg | ORAL_TABLET | Freq: Three times a day (TID) | ORAL | Status: DC | PRN
  Administered 2011-09-15: 0.25 mg via ORAL
  Filled 2011-09-15: qty 1

## 2011-09-15 MED ORDER — ACETAMINOPHEN 325 MG PO TABS: 325.0000 mg | ORAL_TABLET | ORAL | Status: DC | PRN

## 2011-09-15 MED ORDER — MUPIROCIN 2 % EX OINT
TOPICAL_OINTMENT | Freq: Two times a day (BID) | CUTANEOUS | Status: DC
  Administered 2011-09-15: 22:00:00 via NASAL
  Administered 2011-09-15: 1 via NASAL
  Filled 2011-09-15: qty 22

## 2011-09-15 MED ORDER — SODIUM CHLORIDE 0.9 % IV SOLN: INTRAVENOUS | Status: AC

## 2011-09-15 MED ORDER — VANCOMYCIN HCL IN DEXTROSE 1-5 GM/200ML-% IV SOLN
1000.0000 mg | Freq: Two times a day (BID) | INTRAVENOUS | Status: AC
  Administered 2011-09-15: 1000 mg via INTRAVENOUS
  Filled 2011-09-15: qty 200

## 2011-09-15 MED ORDER — MUPIROCIN 2 % EX OINT
TOPICAL_OINTMENT | CUTANEOUS | Status: AC
  Filled 2011-09-15: qty 22

## 2011-09-15 MED ORDER — ONDANSETRON HCL 4 MG/2ML IJ SOLN: 4.0000 mg | Freq: Four times a day (QID) | INTRAMUSCULAR | Status: DC | PRN

## 2011-09-15 MED ORDER — PANTOPRAZOLE SODIUM 40 MG PO TBEC
40.0000 mg | DELAYED_RELEASE_TABLET | Freq: Every day | ORAL | Status: DC
  Administered 2011-09-16: 40 mg via ORAL
  Filled 2011-09-15: qty 1

## 2011-09-15 MED ORDER — LOSARTAN POTASSIUM 25 MG PO TABS
25.0000 mg | ORAL_TABLET | Freq: Every day | ORAL | Status: DC
  Administered 2011-09-16: 25 mg via ORAL
  Filled 2011-09-15 (×2): qty 1

## 2011-09-15 MED ORDER — HYDROMORPHONE HCL 2 MG PO TABS: 1.0000 mg | ORAL_TABLET | Freq: Four times a day (QID) | ORAL | Status: DC | PRN

## 2011-09-15 MED ORDER — SODIUM CHLORIDE 0.45 % IV SOLN: INTRAVENOUS | Status: DC

## 2011-09-15 MED ORDER — TRAMADOL HCL 50 MG PO TABS
50.0000 mg | ORAL_TABLET | Freq: Four times a day (QID) | ORAL | Status: DC | PRN
  Administered 2011-09-15 – 2011-09-16 (×3): 50 mg via ORAL
  Filled 2011-09-15 (×3): qty 1

## 2011-09-15 MED ORDER — SODIUM CHLORIDE 0.9 % IV SOLN
INTRAVENOUS | Status: DC
  Administered 2011-09-15: 07:00:00 via INTRAVENOUS

## 2011-09-15 NOTE — H&P (Signed)
History reviewed, patient examined, no change in status, stable for surgery. Dual chamber permanent pacemaker for symptomatic bradycardia. Risks and benefits discussed. She agrees to proceed.  Thurmon Fair, MD, [facility] and [facility] [phone] office [phone] pager 09/15/2011 8:21 AM

## 2011-09-15 NOTE — CV Procedure (Signed)
Foster,[name] Female, 77 y.o., [birth year]  MRN: [id number]   Procedure report  Procedure performed:  1. Implantation of new dual chamber permanent pacemaker 2. Fluoroscopy 3.  Light sedation 4. Left upper extremity venogram  Reason for procedure: Symptomatic bradycardia due to: Sinus node dysfunction  Procedure performed by: Thurmon Fair, MD  Complications: None  Estimated blood loss: <10 mL  Medications administered during procedure: Vancomycin 1 g intravenously Lidocaine 1% 30 mL locally,  Versed 4 mg intravenously Omnipaque 10 mL for venography  Device details:  Generator Medtronic Revo model RVD R01 serial number [id number] H Right atrial lead Medtronic 5086-45 cm serial number LFP [id number] V Right ventricular lead Medtronic 5086/52 cm serial number [id number]  Procedure details:  After the risks and benefits of the procedure were discussed the patient provided informed consent and was brought to the cardiac cath lab in the fasting state. The patient was prepped and draped in usual sterile fashion. Local anesthesia with 1% lidocaine was administered to to the left infraclavicular area. A 5-6 cm horizontal incision was made parallel with and 2-3 cm caudal to the left clavicle. Using electrocautery and blunt dissection a prepectoral pocket was created down to the level of the pectoralis major muscle fascia. The pocket was carefully inspected for hemostasis. An antibiotic-soaked sponge was placed in the pocket.  Under fluoroscopic guidance and using the modified Seldinger technique 2 separate venipunctures were performed to access the left subclavian vein. moderate difficulty was encountered accessing the vein. A venogram was performed to assist with cannulation of the subclavian vein. Two J-tip guidewires were subsequently exchanged for 8 French safe sheaths.  Under fluoroscopic guidance the ventricular lead was advanced to level of the mid to apical right  ventricular septum and thet active-fixation helix was deployed. Prominent current of injury was seen. Satisfactory pacing and sensing parameters were recorded. There was no evidence of diaphragmatic stimulation at maximum device output. The safe sheath was peeled away and the lead was secured in place with 2-0 silk.  In similar fashion the right atrial lead was advanced to the level of the atrial appendage. The active-fixation helix was deployed. There was prominent current of injury. Satisfactory  pacing and sensing parameters were recorded. There was no evidence of diaphragmatic stimulation with pacing at maximum device output. The safe sheath was peeled away and the lead was secured in place with 2-0 silk.  The antibiotic-soaked sponge was removed from the pocket. The pocket was flushed with copious amounts of antibiotic solution. Reinspection showed excellent hemostasis..  The ventricular lead was connected to the generator and appropriate ventricular pacing was seen. Subsequently the atrial lead was also connected. Repeat testing of the lead parameters later showed excellent values.  The entire system was then carefully inserted in the pocket with care been taking that the leads and device assumed a comfortable position without pressure on the incision. Great care was taken that the leads be located deep to the generator. The pocket was then closed in layers using 2 layers of 2-0 Vicryl and cutaneous staples, after which a sterile dressing was applied.  At the end of the procedure the following lead parameters were encountered:  Right atrial lead  sensed P waves 3.9, impedance 1219 ohms, threshold 0.7 V at 0.5 ms pulse width.  Right ventricular lead sensed R waves 7.8 mV, impedance 1203ohms, threshold 0.9 V at 0.5 ms pulse width.  BM:WUXL Hilty M.D.  Thurmon Fair, MD, [facility] [phone] office [phone]  pager 09/15/2011 10:30 AM

## 2011-09-16 ENCOUNTER — Ambulatory Visit (HOSPITAL_COMMUNITY): Payer: No Typology Code available for payment source

## 2011-09-16 ENCOUNTER — Institutional Professional Consult (permissible substitution): Payer: No Typology Code available for payment source | Admitting: Cardiology

## 2011-09-16 MED ORDER — ACETAMINOPHEN 325 MG PO TABS: 325.0000 mg | ORAL_TABLET | ORAL | Status: DC | PRN

## 2011-09-16 NOTE — Progress Notes (Signed)
Shivon Hackel C. Alwyn Cordner, MD, FACC Attending Cardiologist [facility]  

## 2011-09-16 NOTE — Discharge Instructions (Signed)
Pacemaker Implantation A pacemaker (pacer) is a small device that acts as a backup or takes over for the natural pacemaker of the heart. The heart has its own electrical system to regulate the beating of the heart muscles. The heart pumps best when it beats in a regular, coordinated rhythm.  A pacer consists of a small device (generator) which produces electrical signals that tell your heart to beat. The generator contains a lithium battery and a tiny computer. Wires (leads) connect the generator to the heart. The pacer is placed under the skin through a small cut. It senses every heartbeat and only fires when the heart rate falls outside certain levels. When the pacer triggers a heart beat, it is called "capture." PROBLEMS THAT MAY BE HELPED BY A PACEMAKER:  Your heart rate is sometimes too slow or irregular.   Fainting, dizziness, weakness or confusion as a result of low blood flow.   Shortness of breath.   Chest pain or angina if the heart needs more blood and oxygen.   Disturbed sleep as a result of abnormal heart rhythm.   Palpitations or the feeling that the heart is beating too fast, too hard or in an irregular way.   Weak heart muscle pumping ability.  PROCEDURE   The pacer may be placed under the skin near the collarbone, while you are under sedation. An abdominal wall location may be another option.   The leads are inserted into a vein that lies just under the collarbone, then guided into place under x-ray. The tips of the wires touch the inside of the heart. The near end of the pacer wires are connected to the generator under the skin.   For individuals with thinner chest walls, it may be possible to feel the device under the skin, and a slight bump may be seen.  HOME CARE INSTRUCTIONS   Keep the incision dry for a week after the procedure.   For about 8 weeks, avoid sudden or jerky movements that pull your arm away from your body. This could change the position of the leads.     Take medicine exactly as directed.   Learn how to check your pulse. Follow directions about when to call or be concerned.   Be physically active every day. Ask your caregiver how and when to increase activity.   Household appliances do not interfere with pacemakers.   Travel by car, train or airplane should not be a problem. Carry a pacemaker ID card in case the device sets off a metal detector.  PACEMAKER CARE:  Avoid putting pressure over the area where the pacer was put in.   Digital cell phones should be kept 12 inches away from the pacemaker. Hold them at the ear on the side opposite of the pacer.   Never leave a cell phone in a pocket over the pacemaker.   Avoid strong electro-magnetic fields. You will not be able to have an MRI scan because of the strong magnets.   Pacer batteries last about 5 years and give off warning signals when they are running low on power. Pacers may be checked every 3 months. This allows plenty of time to change the generator when it is running low on power.   Changing the battery means removing the old generator through the same cut and plugging the existing wires into the new generator.   An EKG or heart monitor is used to see if your pacer is working properly. Sometimes signals may be sent   over a land line phone to your clinic.   Tell emergency responders that you have a pacemaker.  SEEK MEDICAL CARE IF:  You begin to gain weight and your feet and ankles swell.   You have dizzy spells or feel weak.   Your pulse rate drops below the limit or is too fast.  SEEK IMMEDIATE MEDICAL CARE IF:  You faint or pass out.   You have chest pain or shortness of breath.   You are injured and think your pacemaker may have been damaged.   You are suddenly very tired or have pain in your back.   You are worried that your heart is not beating right or cannot feel your pulse.  Document Released: 06/05/2002 Document Revised: 06/04/2011 Document Reviewed:  08/12/2007 [employer] Patient Information 2012 [employer]. 

## 2011-09-16 NOTE — Progress Notes (Signed)
Patient ID: [name],  MRN: [id number], DOB/AGE: 08/31/1934 77 y.o.  Admit date: 09/15/2011 Discharge date: 09/16/2011  Primary Care Provider: Dr Dierdre Searles Primary Cardiologist: Dr C. Hilty  Discharge Diagnoses Principal Problem:  *DYSPNEA ON EXERTION Active Problems:  Sinus bradycardia, baselin HR 55-65, now low 40's  Fatigue  HYPOTHYROIDISM, TSH  1.24 Jul 2011  DIASTOLIC DYSFUNCTION, on 2D Jan 2011 (grade 1)  Hypertension  NHL (non-Hodgkin's lymphoma)-recent diagnosis    Procedures: MDT pacemaker implant 09/15/11   Hospital Course HPI: Patient is a 77 year old female with no prior history of coronary disease. Reviewing her primary care records, she always has some degree of bradycardia with a heart rate of 55-65. She's had problems recently with fatigue and dyspnea on exertion. She was referred to a pulmonologist, Dr. Danise Mina. Apparently pulmonary workup was unremarkable. Dr. Delford Field did note a slow heart rate. We saw the patient in the ER 08/21/11 after her primary MD noted a HR in the 30s. She is admitted now for elective pacer implant which she had 09/15/11 without complications. CXR shows no ptx. She will be discharged today and follow up with [place] in one week for a site check.   Discharge Vitals:  Blood pressure 110/72, pulse 61, temperature 97.7 F (36.5 C), temperature source Oral, resp. rate 18, height 5\' 4"  (1.626 m), weight 64.9 kg (143 lb 1.3 oz), SpO2 96.00%.    Labs: Results for orders placed during the hospital encounter of 09/15/11 (from the past 48 hour(s))  SURGICAL PCR SCREEN     Status: Normal   Collection Time   09/15/11  7:03 AM      Component Value Range Comment   MRSA, PCR NEGATIVE  NEGATIVE     Staphylococcus aureus NEGATIVE  NEGATIVE      Disposition:    Discharge Medications:  Medication List  As of 09/16/2011 11:16 AM   ASK your doctor about these medications         ALPRAZolam 0.25 MG tablet   Commonly known as: XANAX   Take 0.25  mg by mouth 3 (three) times daily as needed. For anxiety      amLODipine 10 MG tablet   Commonly known as: NORVASC   Take 10 mg by mouth daily.      denosumab 60 MG/ML Soln injection   Commonly known as: PROLIA   Inject 60 mg into the skin every 6 (six) months. Administer in upper arm, thigh, or abdomen      levothyroxine 100 MCG tablet   Commonly known as: SYNTHROID, LEVOTHROID   Take 100 mcg by mouth daily.      losartan 50 MG tablet   Commonly known as: COZAAR   Take 25 mg by mouth daily.      omeprazole 40 MG capsule   Commonly known as: PRILOSEC   Take 40 mg by mouth 2 (two) times daily with a meal.      traMADol 50 MG tablet   Commonly known as: ULTRAM   Take 50 mg by mouth every 6 (six) hours as needed. pain              Duration of Discharge Encounter: Greater than 30 minutes including physician time.  Jolene Provost PA-C 09/16/2011 11:16 AM

## 2011-09-16 NOTE — Progress Notes (Signed)
Pt. Seen and examined. Agree with the NP/PA-C note as written. Pace and sense are appropriate. Leads stable per chest x-ray. Pocket site is stable. Ok for discharge today. Follow-up with Dr. Salena Saner for wound check and me in a few months.  Chrystie Nose, MD, [facility] Attending Cardiologist [facility]

## 2011-09-16 NOTE — Progress Notes (Signed)
Subjective:  Up without problems  Objective:  Vital Signs in the last 24 hours: Temp:  [97.7 F (36.5 C)-98.6 F (37 C)] 97.7 F (36.5 C) (03/20 0517) Pulse Rate:  [60-61] 61  (03/20 0517) Resp:  [16-18] 18  (03/20 0517) BP: (110-133)/(72-81) 110/72 mmHg (03/20 0517) SpO2:  [94 %-96 %] 96 % (03/20 0517) Weight:  [64.9 kg (143 lb 1.3 oz)] 64.9 kg (143 lb 1.3 oz) (03/19 1421)  Intake/Output from previous day:  Intake/Output Summary (Last 24 hours) at 09/16/11 1114 Last data filed at 09/15/11 2146  Gross per 24 hour  Intake      0 ml  Output    250 ml  Net   -250 ml    Physical Exam: General appearance: alert, cooperative and no distress Lungs: clear to auscultation bilaterally Heart: regular rate and rhythm pacer site without hematoma   Rate: 62  Rhythm: paced  Lab Results: No results found for this basename: WBC:2,HGB:2,PLT:2 in the last 72 hours No results found for this basename: NA:2,K:2,CL:2,CO2:2,GLUCOSE:2,BUN:2,CREATININE:2 in the last 72 hours No results found for this basename: TROPONINI:2,CK,MB:2 in the last 72 hours Hepatic Function Panel No results found for this basename: PROT,ALBUMIN,AST,ALT,ALKPHOS,BILITOT,BILIDIR,IBILI in the last 72 hours No results found for this basename: CHOL in the last 72 hours No results found for this basename: INR in the last 72 hours  Imaging: Dg Chest 2 View  09/16/2011  *RADIOLOGY REPORT*  Clinical Data: Post pacemaker insertion  CHEST - 2 VIEW  Comparison: Portable chest x-ray of 08/21/2011  Findings: The lungs are clear and slightly hyperaerated.  Mild cardiomegaly is stable.  A moderate sized hiatal hernia remains. Significant thoracolumbar scoliosis with degenerative change is noted.  A permanent pacemaker remains.  IMPRESSION: No active lung disease.  No change in cardiomegaly, moderate sized hiatal hernia, and severe thoracolumbar scoliosis with degenerative change.  Original Report Authenticated By: Juline Patch, M.D.     Cardiac Studies:  Assessment/Plan:   Principal Problem:  *DYSPNEA ON EXERTION Active Problems:  Sinus bradycardia, baselin HR 55-65, now low 40's  Fatigue  HYPOTHYROIDISM, TSH  1.24 Jul 2011  DIASTOLIC DYSFUNCTION, on 2D Jan 2011 (grade 1)  Hypertension  NHL (non-Hodgkin's lymphoma)-recent diagnosis   Plan- should be OK for discharge. Site check in one week.   [employer] PA-C 09/16/2011, 11:14 AM

## 2011-09-29 ENCOUNTER — Encounter (HOSPITAL_COMMUNITY): Payer: Self-pay | Admitting: Cardiovascular Disease

## 2011-09-29 ENCOUNTER — Emergency Department (HOSPITAL_COMMUNITY)
Admission: EM | Admit: 2011-09-29 | Discharge: 2011-09-29 | Disposition: A | Discharge status: Home / Self Care | Payer: No Typology Code available for payment source | Attending: Emergency Medicine | Admitting: Emergency Medicine

## 2011-09-29 ENCOUNTER — Other Ambulatory Visit: Payer: Self-pay

## 2011-09-29 DIAGNOSIS — R0989 Other specified symptoms and signs involving the circulatory and respiratory systems: Secondary | ICD-10-CM | POA: Diagnosis present

## 2011-09-29 DIAGNOSIS — Z8571 Personal history of Hodgkin lymphoma: Secondary | ICD-10-CM | POA: Insufficient documentation

## 2011-09-29 DIAGNOSIS — I3139 Other pericardial effusion (noninflammatory): Secondary | ICD-10-CM | POA: Diagnosis not present

## 2011-09-29 DIAGNOSIS — M810 Age-related osteoporosis without current pathological fracture: Secondary | ICD-10-CM | POA: Insufficient documentation

## 2011-09-29 DIAGNOSIS — M419 Scoliosis, unspecified: Secondary | ICD-10-CM | POA: Insufficient documentation

## 2011-09-29 DIAGNOSIS — I10 Essential (primary) hypertension: Secondary | ICD-10-CM | POA: Diagnosis present

## 2011-09-29 DIAGNOSIS — R002 Palpitations: Secondary | ICD-10-CM | POA: Diagnosis present

## 2011-09-29 DIAGNOSIS — R5383 Other fatigue: Secondary | ICD-10-CM | POA: Diagnosis present

## 2011-09-29 DIAGNOSIS — R001 Bradycardia, unspecified: Secondary | ICD-10-CM | POA: Insufficient documentation

## 2011-09-29 DIAGNOSIS — I313 Pericardial effusion (noninflammatory): Secondary | ICD-10-CM | POA: Diagnosis not present

## 2011-09-29 DIAGNOSIS — Z9889 Other specified postprocedural states: Secondary | ICD-10-CM | POA: Insufficient documentation

## 2011-09-29 DIAGNOSIS — I319 Disease of pericardium, unspecified: Secondary | ICD-10-CM | POA: Insufficient documentation

## 2011-09-29 DIAGNOSIS — Z950 Presence of cardiac pacemaker: Secondary | ICD-10-CM | POA: Insufficient documentation

## 2011-09-29 DIAGNOSIS — C8590 Non-Hodgkin lymphoma, unspecified, unspecified site: Secondary | ICD-10-CM | POA: Diagnosis present

## 2011-09-29 DIAGNOSIS — R0609 Other forms of dyspnea: Secondary | ICD-10-CM | POA: Diagnosis present

## 2011-09-29 DIAGNOSIS — Z79899 Other long term (current) drug therapy: Secondary | ICD-10-CM | POA: Insufficient documentation

## 2011-09-29 DIAGNOSIS — R0602 Shortness of breath: Secondary | ICD-10-CM | POA: Insufficient documentation

## 2011-09-29 DIAGNOSIS — I519 Heart disease, unspecified: Secondary | ICD-10-CM | POA: Diagnosis present

## 2011-09-29 DIAGNOSIS — E039 Hypothyroidism, unspecified: Secondary | ICD-10-CM | POA: Insufficient documentation

## 2011-09-29 DIAGNOSIS — R079 Chest pain, unspecified: Secondary | ICD-10-CM | POA: Insufficient documentation

## 2011-09-29 LAB — BASIC METABOLIC PANEL
BUN: 11 mg/dL (ref 6–23)
CO2: 23 mEq/L (ref 19–32)
Calcium: 9.3 mg/dL (ref 8.4–10.5)
Chloride: 109 mEq/L (ref 96–112)
Creatinine, Ser: 0.67 mg/dL (ref 0.50–1.10)
GFR calc Af Amer: >90 mL/min (ref >90)
GFR calc non Af Amer: 84 mL/min — ABNORMAL LOW (ref >90)
Glucose, Bld: 84 mg/dL (ref 70–99)
Potassium: 3.3 mEq/L — ABNORMAL LOW (ref 3.5–5.1)
Sodium: 145 mEq/L (ref 135–145)

## 2011-09-29 LAB — SEDIMENTATION RATE: Sed Rate: 9 mm/hr (ref 0–22)

## 2011-09-29 LAB — CBC
HCT: 36 % (ref 36.0–46.0)
Hemoglobin: 12.3 g/dL (ref 12.0–15.0)
MCH: 29.7 pg (ref 26.0–34.0)
MCHC: 34.2 g/dL (ref 30.0–36.0)
MCV: 87 fL (ref 78.0–100.0)
Platelets: 269 10*3/uL (ref 150–400)
RBC: 4.14 MIL/uL (ref 3.87–5.11)
RDW: 14.6 % (ref 11.5–15.5)
WBC: 4.9 10*3/uL (ref 4.0–10.5)

## 2011-09-29 MED ORDER — POTASSIUM CHLORIDE CRYS ER 20 MEQ PO TBCR
40.0000 meq | EXTENDED_RELEASE_TABLET | Freq: Once | ORAL | Status: AC
  Administered 2011-09-29: 40 meq via ORAL
  Filled 2011-09-29: qty 2

## 2011-09-29 MED ORDER — AMIODARONE HCL 200 MG PO TABS: 400.0000 mg | ORAL_TABLET | Freq: Two times a day (BID) | ORAL | Status: DC

## 2011-09-29 MED ORDER — METHYLPREDNISOLONE (PAK) 4 MG PO TABS: ORAL_TABLET | ORAL | Status: AC

## 2011-09-29 NOTE — Discharge Instructions (Signed)
Pericarditis   Pericarditis is an inflammation (soreness or redness) of the sac (almost like a bag) surrounding the heart, starting at the large vessels at the top of the heart, shown in the picture, and wrapping down around the heart. The inside of this sac is very smooth so the heart can beat and slide easily within this protective membrane. The outside of the sac is a tougher layer of material. This sac may become inflamed by a number of different problems. Pericarditis is usually accompanied by pain in the chest which radiates to the shoulder, back, and upper and middle belly (abdomen). There may be shortness of breath and swelling of the abdomen. Sometimes fluid collects around the heart. When this happens the heart cannot beat as well. When your heart is unable to work as well, you will not feel well. You have may have shortness of breath (dyspnea) with exertion such as climbing stairs. The kidneys do not work as well so you may retain fluid. This is also one of the reasons your lower legs and ankles swell.   The first sign you will usually recognize is chest pain. You will also usually get a rapid heartbeat. You may have sudden unexplained weight gain of ten to fifteen pounds. You may get short of breath while sleeping. The heart actually has to work harder while you are lying down. This may also produce a night cough. It may help to sleep with two or more pillows.   With treatment these symptoms usually improve rapidly. Upon discharge from this location, weigh yourself after arriving home. Record your weight daily at the same time. This will give some indication as to your progress. As you get better your weight will usually go down. Follow a low sodium diet.   CAUSES   Medicine side effects.   Radiation as received for treatment of cancer.   Tumors or cancer themselves.   Infections. These may be caused by viruses, germs (bacteria) or fungi.   Tuberculosis (this is also an infection).   Autoimmune disorders  such as lupus, scleroderma, and rheumatoid arthritis.   DIAGNOSIS   The diagnosis of pericarditis is made by history (asking the patient what seems to be the problem) and physical exam (looking and listening to the patient by the caregiver). Blood tests may need to be done. Often specialized tests such as echocardiogram (pictures which are taken by bouncing sound waves off the heart), CT scans of the heart and chest, and perhaps other tests depending on what is happening, may be done.   TREATMENT   Treatment of pericarditis will usually include medicine to relieve pain.   If water retention is present, water reducing pills (diuretics) may be given to get rid of the water accumulation.   If the pericarditis is caused by an infection which can be treated, medicine which kill germs (antibiotics) will be started. If the infection is caused by a fungus or tuberculosis, treatment may be necessary for very long times.   If the infection is caused by a virus it will usually run its course and cause no further problems. When a virus is the cause, anti-inflammatory medicines (medicine against soreness) will often be given.   If complications occur from pericarditis, such as scarring of the pericardial sac following the inflammation, surgery is rarely needed to remove the sac. The sac surrounding the heart is not necessary for life. When it becomes scarred, it makes it more difficult for the heart to beat normally.     can help you with your post hospital care program. Follow your caregiver's advice regarding dieting and exercise. Eat a low fat diet. Use alcohol only as directed. Take all medicines as directed.   Eat a nutritious diet low in fat and sodium.   Try to maintain an ideal weight. Your caregivers can help you with this.   Exercise as instructed.   Wear a medical alert bracelet if recommended by your  caregiver.   Keep medicine with you, including a list with dosages, in case of an emergency.   Quit smoking, if you smoke.  SEEK IMMEDIATE MEDICAL CARE IF:   Chest pain.   Vomiting.   Sweating (diaphoresis).   Irregular heartbeat (palpitations).   Racing heart.   Fainting episodes.   Feeling sick to your stomach (nausea).   Weakness.  If you develop any of the symptoms which originally made you seek care, call for local emergency medical help. Do not drive yourself to the hospital. Document Released: 12/09/2000 Document Revised: 06/04/2011 Document Reviewed: 06/17/2011 [facility] Patient Information 2012 [place], [place].Atrial Fibrillation Your caregiver has diagnosed you with atrial fibrillation (AFib). The heart normally beats very regularly; AFib is a type of irregular heartbeat. The heart rate may be faster or slower than normal. This can prevent your heart from pumping as well as it should. AFib can be constant (chronic) or intermittent (paroxysmal). CAUSES  Atrial fibrillation may be caused by:  Heart disease, including heart attack, coronary artery disease, heart failure, diseases of the heart valves, and others.   Blood clot in the lungs (pulmonary embolism).   Pneumonia or other infections.   Chronic lung disease.   Thyroid disease.   Toxins. These include alcohol, some medications (such as decongestant medications or diet pills), and caffeine.  In some people, no cause for AFib can be found. This is referred to as Lone Atrial Fibrillation. SYMPTOMS   Palpitations or a fluttering in your chest.   A vague sense of chest discomfort.   Shortness of breath.   Sudden onset of lightheadedness or weakness.  Sometimes, the first sign of AFib can be a complication of the condition. This could be a stroke or heart failure. DIAGNOSIS  Your description of your condition may make your caregiver suspicious of atrial fibrillation. Your caregiver will examine your pulse to  determine if fibrillation is present. An EKG (electrocardiogram) will confirm the diagnosis. Further testing may help determine what caused you to have atrial fibrillation. This may include chest x-ray, echocardiogram, blood tests, or CT scans. PREVENTION  If you have previously had atrial fibrillation, your caregiver may advise you to avoid substances known to cause the condition (such as stimulant medications, and possibly caffeine or alcohol). You may be advised to use medications to prevent recurrence. Proper treatment of any underlying condition is important to help prevent recurrence. PROGNOSIS  Atrial fibrillation does tend to become a chronic condition over time. It can cause significant complications (see below). Atrial fibrillation is not usually immediately life-threatening, but it can shorten your life expectancy. This seems to be worse in women. If you have lone atrial fibrillation and are under 64 years old, the risk of complications is very low, and life expectancy is not shortened. RISKS AND COMPLICATIONS  Complications of atrial fibrillation can include stroke, chest pain, and heart failure. Your caregiver will recommend treatments for the atrial fibrillation, as well as for any underlying conditions, to help minimize risk of complications. TREATMENT  Treatment for AFib is divided into several categories:  Treatment of any underlying condition.   Converting you out of AFib into a regular (sinus) rhythm.   Controlling rapid heart rate.   Prevention of blood clots and stroke.  Medications and procedures are available to convert your atrial fibrillation to sinus rhythm. However, recent studies have shown that this may not offer you any advantage, and cardiac experts are continuing research and debate on this topic. More important is controlling your rapid heartbeat. The rapid heartbeat causes more symptoms, and places strain on your heart. Your caregiver will advise you on the use of  medications that can control your heart rate. Atrial fibrillation is a strong stroke risk. You can lessen this risk by taking blood thinning medications such as Coumadin (warfarin), or sometimes aspirin. These medications need close monitoring by your caregiver. Over-medication can cause bleeding. Too little medication may not protect against stroke. HOME CARE INSTRUCTIONS   If your caregiver prescribed medicine to make your heartbeat more normally, take as directed.   If blood thinners were prescribed by your caregiver, take EXACTLY as directed.   Perform blood tests EXACTLY as directed.   Quit smoking. Smoking increases your cardiac and lung (pulmonary) risks.   DO NOT drink alcohol.   DO NOT drink caffeinated drinks (e.g. coffee, soda, chocolate, and leaf teas). You may drink decaffeinated coffee, soda or tea.   If you are overweight, you should choose a reduced calorie diet to lose weight. Please see a registered dietitian if you need more information about healthy weight loss. DO NOT USE DIET PILLS as they may aggravate heart problems.   If you have other heart problems that are causing AFib, you may need to eat a low salt, fat, and cholesterol diet. Your caregiver will tell you if this is necessary.   Exercise every day to improve your physical fitness. Stay active unless advised otherwise.   If your caregiver has given you a follow-up appointment, it is very important to keep that appointment. Not keeping the appointment could result in heart failure or stroke. If there is any problem keeping the appointment, you must call back to this facility for assistance.  SEEK MEDICAL CARE IF:  You notice a change in the rate, rhythm or strength of your heartbeat.   You develop an infection or any other change in your overall health status.  SEEK IMMEDIATE MEDICAL CARE IF:   You develop chest pain, abdominal pain, sweating, weakness or feel sick to your stomach (nausea).   You develop  shortness of breath.   You develop swollen feet and ankles.   You develop dizziness, numbness, or weakness of your face or limbs, or any change in vision or speech.  MAKE SURE YOU:   Understand these instructions.   Will watch your condition.   Will get help right away if you are not doing well or get worse.  Document Released: 06/15/2005 Document Revised: 06/04/2011 Document Reviewed: 01/18/2008 [facility] Patient Information 2012 [place], [place].

## 2011-09-29 NOTE — ED Notes (Signed)
PT ambulated with a steady gait; VSS; A&Ox3; no signs of distress; respirations even and unlabored; skin warm and dry. Pt has no questions at this time.

## 2011-09-29 NOTE — H&P (Signed)
Patient ID: [name] MRN: [id number], DOB/AGE: [birth year]   Admit date: 09/29/2011   Primary Physician: [name], Erica Dach, MD, MD Primary Cardiologist: Dr [name]   HPI: Patient is a 77 year old female with no prior history of coronary disease. Reviewing her primary care records, she always has some degree of bradycardia with a heart rate of 55-65. She's had problems recently with fatigue and dyspnea on exertion. She was referred to a pulmonologist, Dr. Danise Foster. Apparently pulmonary workup was unremarkable. Dr. Delford Foster did note a slow heart rate. We saw the patient in the ER 08/21/11 after her primary MD noted a HR in the 30s. She was admitted  for elective pacer implant which she had 09/15/11 without complications. CXR post pacer showed no ptx. She was discharge 09/16/11. Office visit 09/22/11 for site check unremarkable. The pt was at [facility] when she developed chest pain and SOB.  She was admitted to [facility] 09/25/11. 2D echo showed mild to moderate pericardial effusion. CT was negative for Pulmonary embolism. She was treated with colchicine and discharged. Today she called the office complaining of tachycardia with HR as high as 120. She feels "drained" and weak. In ER she is currently paced at 65.      Problem List: Past Medical History  Diagnosis Date  . Thyroid disease     hypothyroid  . Anemia   . Osteoporosis   . Pleural effusion, bilateral     history of  . Pulmonary nodules     history of  . History of pancreatitis   . Cystocele     history with rectocele  . Scoliosis   . Varicose vein   . Non Hodgkin's lymphoma     Past Surgical History  Procedure Date  . Breast surgery 07/30/01    biopsy x 3  . Abdominal hysterectomy 04/29/04    partial  . Pacemaker insertion 09/15/11    MDT     Allergies:  Allergies  Allergen Reactions  . Amoxicillin Hives    Hives  . Codeine Hives    REACTION: "makes her crazy" Hives  . Demerol Hives  .  Morphine And Related Hives  . Vicodin (Hydrocodone-Acetaminophen) Hives  . Sulfonamide Derivatives Rash    REACTION: Rash     Home Medications  (Not in a hospital admission)   Family History  Problem Relation Age of Onset  . Heart disease Daughter     congenital "whole in her heart"  . Stroke Other   . Coronary artery disease Other   . Lymphoma Daughter     nonhodgkins     History   Social History  . Marital Status: Widowed    Spouse Name: N/A    Number of Children: 3  . Years of Education: N/A   Occupational History  . self employed    Social History Main Topics  . Smoking status: Never Smoker   . Smokeless tobacco: Never Used  . Alcohol Use: Yes  . Drug Use: No  . Sexually Active: Not on file   Other Topics Concern  . Not on file   Social History Narrative  . No narrative on file     Review of Systems:  negative for syncope  Physical Exam: Blood pressure 117/61, pulse 62, temperature 97.6 F (36.4 C), temperature source Oral, resp. rate 18, SpO2 100.00%.  General appearance: alert, cooperative and no distress Neck: no carotid bruit, no JVD and supple, symmetrical, trachea midline Lungs: clear to auscultation bilaterally  Heart: regular rate and rhythm Abdomen: soft, non-tender; bowel sounds normal; no masses,  no organomegaly Extremities: no edma Pulses: 2+ and symmetric Skin: Skin color, texture, turgor normal. No rashes or lesions Neurologic: Grossly normal    Labs:  No results found for this or any previous visit (from the past 24 hour(s)).   Radiology/Studies: Dg Chest 2 View  09/16/2011  *RADIOLOGY REPORT*  Clinical Data: Post pacemaker insertion  CHEST - 2 VIEW  Comparison: Portable chest x-ray of 08/21/2011  Findings: The lungs are clear and slightly hyperaerated.  Mild cardiomegaly is stable.  A moderate sized hiatal hernia remains. Significant thoracolumbar scoliosis with degenerative change is noted.  A permanent pacemaker remains.   IMPRESSION: No active lung disease.  No change in cardiomegaly, moderate sized hiatal hernia, and severe thoracolumbar scoliosis with degenerative change.  Original Report Authenticated By: [name], M.D.    EKG: Paced   ASSESSMENT AND PLAN:   Principal Problem:  *DYSPNEA ON EXERTION  Active Problems:  Pericardial effusion after pacemaker, hospitalized at [facility] 09/25/11  Palpitations  Sinus bradycardia, symptomatic, s/p MDT PTVDP 09/15/11  Fatigue  DIASTOLIC DYSFUNCTION, on 2D Jan 2011 (grade 1)  Hypertension  NHL (non-Hodgkin's lymphoma)-recent diagnosis  Scoliosis Plan- per Dr [name], 2 D in ER to r/o tamponade, pacer interrogation revealed rapid AF episode. Plan is for Amio 400mg  BID X 2WKs, then 200mg  daily for 1 WK then stop.   Erica Pretty, PA-C 09/29/2011, 6:19 PM  I have seen and examined the patient along with Erica K, PA-C.  I have reviewed the chart, notes and new data.  I agree with PA's note.  Key new complaints: palpitations associated with SOB sudden onset and sudden termination lasting approx 1.5 hours at 3-4 PM today. Now asymptomatic. Pleuritic chest pain last week has almost completely resolved Key examination changes: Now in A paced V sensed rhythm; no rub Key new findings / data: labs pending  bedside echo shows a small pericardial effusion, much smaller than seen on the images from the DVD from [facility].   ECG without ST elevation or significant ST depression.   Pacemaker interrogation shows several back-to back episodes of AF with RVR that coincide with her complaints. A few very brief AF episodes were seen on March 31 as well. Overall burden 2.2 % in last week. Otherwise A paced rhythm accounts for 88% of beats. V pace is rare (<2%).   PLAN: Suspect AF is pericarditis related. Plan short course of amiodarone. No anticoagulation due to active pericarditis and brevity of AF. Add brief course of steroids to hasten  resolution of inflammation. She prefers outpatient management. Cautioned to return if symptoms of dyspnea or presyncope are severe.   Erica Fair, MD, [facility] [phone] 09/29/2011, 7:01 PM

## 2011-09-29 NOTE — Progress Notes (Signed)
  Echocardiogram 2D Echocardiogram has been performed.  [name], Real Cons 09/29/2011, 6:53 PM

## 2011-09-29 NOTE — ED Notes (Signed)
Pt reports tightness to center of chest that started this am, went away on its own and then came back more severe this afternoon. Pt reports sob and palpitations.

## 2011-09-29 NOTE — ED Notes (Signed)
Cardiology at the bedside at assess pt at this time. Vital signs stable. Denies any pain at this time. Cardiology evaluating pacemaker.

## 2011-10-20 ENCOUNTER — Other Ambulatory Visit: Payer: Self-pay | Admitting: Obstetrics and Gynecology

## 2011-10-22 ENCOUNTER — Ambulatory Visit: Payer: No Typology Code available for payment source | Admitting: Internal Medicine

## 2011-12-04 ENCOUNTER — Other Ambulatory Visit (HOSPITAL_COMMUNITY): Payer: Self-pay | Admitting: *Deleted

## 2011-12-04 DIAGNOSIS — C8590 Non-Hodgkin lymphoma, unspecified, unspecified site: Secondary | ICD-10-CM

## 2011-12-09 ENCOUNTER — Encounter (HOSPITAL_COMMUNITY)
Admission: RE | Admit: 2011-12-09 | Discharge: 2011-12-09 | Disposition: A | Discharge status: Home / Self Care | Payer: No Typology Code available for payment source | Source: Ambulatory Visit | Attending: Internal Medicine | Admitting: Internal Medicine

## 2011-12-09 DIAGNOSIS — C8589 Other specified types of non-Hodgkin lymphoma, extranodal and solid organ sites: Secondary | ICD-10-CM | POA: Insufficient documentation

## 2011-12-09 DIAGNOSIS — C8590 Non-Hodgkin lymphoma, unspecified, unspecified site: Secondary | ICD-10-CM

## 2011-12-09 MED ORDER — FLUDEOXYGLUCOSE F - 18 (FDG) INJECTION
17.4000 | Freq: Once | INTRAVENOUS | Status: AC | PRN
  Administered 2011-12-09: 17.4 via INTRAVENOUS

## 2011-12-12 ENCOUNTER — Other Ambulatory Visit: Payer: Self-pay | Admitting: Internal Medicine

## 2011-12-14 NOTE — Telephone Encounter (Signed)
#  30 rf 1 

## 2011-12-14 NOTE — Telephone Encounter (Signed)
Call placed to [facility] at [phone], verbal refill provided to [facility], per Dr Rodena Medin okay.

## 2011-12-24 ENCOUNTER — Other Ambulatory Visit: Payer: Self-pay | Admitting: Internal Medicine

## 2011-12-24 NOTE — Telephone Encounter (Signed)
Rx refill sent to pharmacy. 

## 2011-12-28 ENCOUNTER — Other Ambulatory Visit: Payer: Self-pay | Admitting: Internal Medicine

## 2011-12-28 NOTE — Telephone Encounter (Signed)
Ok to fill at 25mg  qd. rf 5. Pt cancelled 09/2011 appt. Needs f/u appt within next 3 months

## 2011-12-28 NOTE — Telephone Encounter (Signed)
Received refill request from pharmacy for Losartan 50mg  1 tablet daily. Our records indicate 1/2 tablet daily. Verified with pt that she is currently taking 1/2 tablet daily. Pt was last seen by [place] in January and has no future appts on file. Please advise re: refills.

## 2011-12-29 NOTE — Telephone Encounter (Signed)
30 min pls

## 2011-12-29 NOTE — Telephone Encounter (Signed)
Patient returned phone call. I informed her that refill of losartan has been sent to pharmacy and that she needs to schedule a follow up visit within three months.   She states that she had a CT done a few days ago that showed "something". Now, she is stating that she needs Dr. Rodena Medin to order an [place] of her thyroid?   She wanted to come in next week. I made an appointment for 01/07/12 for a 15 minute visit. Please let me know if I need to change appointment. Thanks!

## 2011-12-29 NOTE — Telephone Encounter (Signed)
Spoke to pt, she states her cancer doctor in [name] recommended that she have an u/s of her thyroid. Pt states that she will bring a copy of her latest office visit / recommendation from her specialist and discuss with Dr Rodena Medin at her f/u on 01/07/12. Do you want this to be a 30 minute visit?

## 2011-12-29 NOTE — Telephone Encounter (Signed)
Refills sent to pharmacy. Please call pt to arrange f/u as instructed below.

## 2011-12-30 ENCOUNTER — Other Ambulatory Visit: Payer: Self-pay | Admitting: Internal Medicine

## 2011-12-30 NOTE — Telephone Encounter (Signed)
Amlodipine request denied as refill was just sent on 12/24/11 #90 x no refills. Sent note for pharmacy to use refill on file.

## 2012-01-01 NOTE — Telephone Encounter (Signed)
Notified scheduler and appt changed to .

## 2012-01-07 ENCOUNTER — Encounter: Payer: Self-pay | Admitting: Internal Medicine

## 2012-01-07 ENCOUNTER — Ambulatory Visit (INDEPENDENT_AMBULATORY_CARE_PROVIDER_SITE_OTHER): Payer: No Typology Code available for payment source | Admitting: Internal Medicine

## 2012-01-07 ENCOUNTER — Ambulatory Visit (HOSPITAL_BASED_OUTPATIENT_CLINIC_OR_DEPARTMENT_OTHER)
Admission: RE | Admit: 2012-01-07 | Discharge: 2012-01-07 | Disposition: A | Discharge status: Home / Self Care | Payer: No Typology Code available for payment source | Source: Ambulatory Visit | Attending: Internal Medicine | Admitting: Internal Medicine

## 2012-01-07 VITALS — BP 98/70 | HR 63 | Temp 97.8°F | Resp 16 | Ht 64.0 in | Wt 140.0 lb

## 2012-01-07 DIAGNOSIS — K449 Diaphragmatic hernia without obstruction or gangrene: Secondary | ICD-10-CM

## 2012-01-07 DIAGNOSIS — E041 Nontoxic single thyroid nodule: Secondary | ICD-10-CM

## 2012-01-07 DIAGNOSIS — Z79899 Other long term (current) drug therapy: Secondary | ICD-10-CM

## 2012-01-07 DIAGNOSIS — E039 Hypothyroidism, unspecified: Secondary | ICD-10-CM

## 2012-01-07 DIAGNOSIS — R946 Abnormal results of thyroid function studies: Secondary | ICD-10-CM | POA: Insufficient documentation

## 2012-01-07 LAB — TSH: TSH: 3.734 u[IU]/mL (ref 0.350–4.500)

## 2012-01-07 LAB — HEPATIC FUNCTION PANEL
ALT: 22 U/L (ref 0–35)
Bilirubin, Direct: 0.1 mg/dL (ref 0.0–0.3)
Total Bilirubin: 0.4 mg/dL (ref 0.3–1.2)

## 2012-01-07 LAB — BASIC METABOLIC PANEL
BUN: 11 mg/dL (ref 6–23)
Calcium: 9.7 mg/dL (ref 8.4–10.5)
Chloride: 105 mEq/L (ref 96–112)
Creat: 0.94 mg/dL (ref 0.50–1.10)

## 2012-01-07 NOTE — Assessment & Plan Note (Signed)
Schedule thyroid u/s 

## 2012-01-07 NOTE — Progress Notes (Signed)
  Subjective:    Patient ID: [name], female    DOB: 06/23/1935, 77 y.o.   MRN: [id number]  HPI Pt presents to clinic for followup of multiple medical problems. Reviewed outside PET scan noting thyroid uptake diffusely/bilaterally/homegenously. Also noted is incidental HH with ~1/2 of proximal stomach involved. H/o thyroid cysts/nodules noted on thyroid US 2011.   Past Medical History  Diagnosis Date  . Thyroid disease     hypothyroid  . Anemia   . Osteoporosis   . Pleural effusion, bilateral     history of  . Pulmonary nodules     history of  . History of pancreatitis   . Cystocele     history with rectocele  . Scoliosis   . Varicose vein   . Non Hodgkin's lymphoma    Past Surgical History  Procedure Date  . Breast surgery 07/30/01    biopsy x 3  . Abdominal hysterectomy 04/29/04    partial  . Pacemaker insertion 09/15/11    MDT    reports that she has never smoked. She has never used smokeless tobacco. She reports that she drinks alcohol. She reports that she does not use illicit drugs. family history includes Coronary artery disease in her other; Heart disease in her daughter; Lymphoma in her daughter; and Stroke in her other. Allergies  Allergen Reactions  . Amoxicillin Hives    Hives  . Codeine Hives    REACTION: "makes her crazy" Hives  . Demerol Hives  . Morphine And Related Hives  . Vicodin (Hydrocodone-Acetaminophen) Hives  . Sulfonamide Derivatives Rash    REACTION: Rash      Review of Systems see hpi     Objective:   Physical Exam  Physical Exam  Nursing note and vitals reviewed. Constitutional: Appears well-developed and well-nourished. No distress.  HENT:  Head: Normocephalic and atraumatic.  Right Ear: External ear normal.  Left Ear: External ear normal.  Eyes: Conjunctivae are normal. No scleral icterus.  Neck: Neck supple. Carotid bruit is not present.  Cardiovascular: Normal rate, regular rhythm and normal heart sounds.  Exam  reveals no gallop and no friction rub.   No murmur heard. Pulmonary/Chest: Effort normal and breath sounds normal. No respiratory distress. He has no wheezes. no rales.  Lymphadenopathy:    He has no cervical adenopathy.  Neurological:Alert.  Skin: Skin is warm and dry. Not diaphoretic.  Psychiatric: Has a normal mood and affect.        Assessment & Plan:

## 2012-01-07 NOTE — Assessment & Plan Note (Signed)
Obtain tsh/free t4 

## 2012-01-07 NOTE — Assessment & Plan Note (Signed)
Surgery consult

## 2012-01-11 ENCOUNTER — Telehealth: Payer: Self-pay | Admitting: *Deleted

## 2012-01-11 DIAGNOSIS — E042 Nontoxic multinodular goiter: Secondary | ICD-10-CM

## 2012-01-11 DIAGNOSIS — K449 Diaphragmatic hernia without obstruction or gangrene: Secondary | ICD-10-CM

## 2012-01-11 NOTE — Telephone Encounter (Signed)
Patient informed; Ok for referral to Endocrinology [explained referral process to patient]; pt understood & agreed/SLS

## 2012-01-11 NOTE — Telephone Encounter (Signed)
Message copied by Regis Bill on Mon Jan 11, 2012  6:03 PM ------      Message from: Edwyna Perfect      Created: Mon Jan 11, 2012  8:43 AM       Labs nl. Thyroid US continues to show old thyroid nodules plus development of a new small nodule. Recommend endocrinology consult. If pt willing will place referral

## 2012-01-13 NOTE — Addendum Note (Signed)
Addended by: Regis Bill on: 01/13/2012 04:58 PM   Modules accepted: Orders

## 2012-01-13 NOTE — Telephone Encounter (Signed)
GI referral placed/SLS

## 2012-01-13 NOTE — Telephone Encounter (Signed)
Message     pls notify pt that surgery needs a more specific test done prior to scheduling appt to evaluate hiatal hernia. Needs upper GI. If willing i will place order   ----- Message -----   From: Eulah Pont   Sent: 01/11/2012 9:49 AM   To: Edwyna Perfect, MD   Subject: Referral       Per [facility] , pt needs Upper G I before scheduling appointment

## 2012-01-14 ENCOUNTER — Encounter: Payer: Self-pay | Admitting: Gastroenterology

## 2012-02-10 ENCOUNTER — Ambulatory Visit: Payer: No Typology Code available for payment source | Admitting: Gastroenterology

## 2012-02-19 ENCOUNTER — Encounter: Payer: Self-pay | Admitting: Gastroenterology

## 2012-02-22 ENCOUNTER — Other Ambulatory Visit: Payer: Self-pay | Admitting: Internal Medicine

## 2012-02-22 NOTE — Telephone Encounter (Signed)
Please advise re: refills:  Medication name:  Name from pharmacy:  ALPRAZolam (XANAX) 0.25 MG tablet  ALPRAZOLAM 0.25 MG TABLET Sig: take 1 tablet by mouth three times a day if needed Dispense: 30 tablet Refills: 1 Start: 02/22/2012 Class: Normal Requested on: 12/14/2011 Originally ordered on: 08/21/2011 Last refill: 01/27/2012

## 2012-03-02 ENCOUNTER — Telehealth: Payer: Self-pay | Admitting: Gastroenterology

## 2012-03-02 NOTE — Telephone Encounter (Signed)
Message copied by Leanor Kail I on Wed Mar 02, 2012 10:30 AM ------      Message from: Donata Duff      Created: Wed Feb 10, 2012 10:03 AM       Do not bill

## 2012-03-14 ENCOUNTER — Other Ambulatory Visit: Payer: Self-pay | Admitting: Internal Medicine

## 2012-03-14 DIAGNOSIS — E042 Nontoxic multinodular goiter: Secondary | ICD-10-CM

## 2012-03-15 ENCOUNTER — Ambulatory Visit
Admission: RE | Admit: 2012-03-15 | Discharge: 2012-03-15 | Disposition: A | Discharge status: Home / Self Care | Payer: No Typology Code available for payment source | Source: Ambulatory Visit | Attending: Internal Medicine | Admitting: Internal Medicine

## 2012-03-15 ENCOUNTER — Other Ambulatory Visit (HOSPITAL_COMMUNITY)
Admission: RE | Admit: 2012-03-15 | Discharge: 2012-03-15 | Disposition: A | Discharge status: Home / Self Care | Payer: No Typology Code available for payment source | Source: Ambulatory Visit | Attending: Interventional Radiology | Admitting: Interventional Radiology

## 2012-03-15 DIAGNOSIS — E049 Nontoxic goiter, unspecified: Secondary | ICD-10-CM | POA: Insufficient documentation

## 2012-03-15 DIAGNOSIS — E042 Nontoxic multinodular goiter: Secondary | ICD-10-CM

## 2012-03-17 ENCOUNTER — Other Ambulatory Visit: Payer: No Typology Code available for payment source

## 2012-03-18 ENCOUNTER — Encounter: Payer: Self-pay | Admitting: Gastroenterology

## 2012-03-18 ENCOUNTER — Ambulatory Visit (INDEPENDENT_AMBULATORY_CARE_PROVIDER_SITE_OTHER): Payer: No Typology Code available for payment source | Admitting: Gastroenterology

## 2012-03-18 VITALS — BP 110/80 | HR 76 | Ht 62.25 in | Wt 140.5 lb

## 2012-03-18 DIAGNOSIS — K219 Gastro-esophageal reflux disease without esophagitis: Secondary | ICD-10-CM

## 2012-03-18 DIAGNOSIS — K449 Diaphragmatic hernia without obstruction or gangrene: Secondary | ICD-10-CM

## 2012-03-18 NOTE — Patient Instructions (Addendum)
We will get records from [facility] (colonoscopy from 2011, including pathology reports).  Will advise on recall timing for colonoscopy. You have a large hiatal hernia that is probably contributing to GERD but otherwise is not causing any symptoms, should not be fixed. IF you have worsening GI symptoms, please call For now, stay on once daily prilosec. A copy of this information will be made available to Dr. Rodena Medin.

## 2012-03-18 NOTE — Progress Notes (Signed)
HPI: This is a Very pleasant 77 year old woman whom I am meeting for the first time today.  She is a patient from [facility] gastroenterology.  She had a recent PET CT scan done to follow up for NHL, was treated for about 1 year ended recently.  Oncologist in [facility] in [place], affiliated with [facility] Dareen Piano. This found that she had a large hiatal hernia, the radiologist estimated about one half of her stomach was in her chest. She was referred to [facility] surgery however they asked for upper GI test to be done prior to that visit. She was referred here.  She has never heard of HH previously.  Underwent EGD by Dr. Randa Evens in 2006. She was not noted to have a hiatal hernia at that time.  Had colonoscopy with Dr. Bosie Clos; polyps were found.  Was told she was supposed have another this past summer but she declined it.  Has pyrosis, controlled well with prilosec once daily.  Overall stable weight.  No anorexia.    Review of systems: Pertinent positive and negative review of systems were noted in the above HPI section. Complete review of systems was performed and was otherwise normal.    Past Medical History  Diagnosis Date  . Hypothyroidism   . Anemia     pt denies  . Osteoporosis   . Pleural effusion, bilateral     history of  . Pulmonary nodules     history of  . History of pancreatitis     pt denies  . Cystocele     history with rectocele, pt denies  . Scoliosis   . Varicose vein   . Non Hodgkin's lymphoma     Past Surgical History  Procedure Date  . Breast surgery 07/30/01    biopsy x 3, bilateral  . Abdominal hysterectomy 04/29/04    partial  . Pacemaker insertion 09/15/11    MDT  . Tonsillectomy     Current Outpatient Prescriptions  Medication Sig Dispense Refill  . acetaminophen (TYLENOL) 325 MG tablet Take 325-650 mg by mouth every 4 (four) hours as needed.      . ALPRAZolam (XANAX) 0.25 MG tablet take 1 tablet by mouth three times a day if needed   30 tablet  1  . amLODipine (NORVASC) 10 MG tablet Take 1 tablet (10 mg total) by mouth daily. Office visit is required for refills  90 tablet  0  . aspirin 81 MG EC tablet Take 81 mg by mouth daily. Swallow whole.      . denosumab (PROLIA) 60 MG/ML SOLN injection Inject 60 mg into the skin every 6 (six) months. Used six months ago and is due now...Marland KitchenMarland KitchenAdminister in upper arm, thigh, or abdomen      . dronedarone (MULTAQ) 400 MG tablet Take 400 mg by mouth daily.      [name] levothyroxine (SYNTHROID, LEVOTHROID) 100 MCG tablet Take 100 mcg by mouth daily.      [name] losartan (COZAAR) 50 MG tablet Take 0.5 tablets (25 mg total) by mouth daily.  45 tablet  1  . omeprazole (PRILOSEC) 40 MG capsule Take 40 mg by mouth daily.       . traMADol (ULTRAM) 50 MG tablet Take 50 mg by mouth every 6 (six) hours as needed. pain      . DISCONTD: losartan (COZAAR) 50 MG tablet Take 1 tablet (50 mg total) by mouth daily.  90 tablet  2    Allergies as of 03/18/2012 - Review Complete  03/18/2012  Allergen Reaction Noted  . Amoxicillin Hives 04/09/2011  . Codeine Hives   . Demerol Hives 03/11/2011  . Morphine and related Hives 03/11/2011  . Vicodin (hydrocodone-acetaminophen) Hives 03/11/2011  . Sulfonamide derivatives Rash     Family History  Problem Relation Age of Onset  . Heart disease Daughter     congenital "whole in her heart"  . Stroke Mother   . Lymphoma Daughter     nonhodgkins  . Stroke Father     History   Social History  . Marital Status: Widowed    Spouse Name: N/A    Number of Children: 3  . Years of Education: N/A   Occupational History  . self employed    Social History Main Topics  . Smoking status: Never Smoker   . Smokeless tobacco: Never Used  . Alcohol Use: Yes     1 glass of wine occas  . Drug Use: No  . Sexually Active: Not on file   Other Topics Concern  . Not on file   Social History Narrative  . No narrative on file       Physical Exam: BP 110/80  Pulse 76   Ht 5' 2.25" (1.581 m)  Wt 140 lb 8 oz (63.73 kg)  BMI 25.49 kg/m2 Constitutional: generally well-appearing Psychiatric: alert and oriented x3 Eyes: extraocular movements intact Mouth: oral pharynx moist, no lesions Neck: supple no lymphadenopathy Cardiovascular: heart regular rate and rhythm Lungs: clear to auscultation bilaterally Abdomen: soft, nontender, nondistended, no obvious ascites, no peritoneal signs, normal bowel sounds Extremities: no lower extremity edema bilaterally Skin: no lesions on visible extremities    Assessment and plan: 77 y.o. female with  large hiatal hernia  Her hiatal hernia is almost certainly contributing to her acid symptoms however I can detect no other clear problems from it. Her GERD is well controlled on once daily Prilosec. Given lack of other upper GI symptoms I do not think it is hiatal hernia warrants surgery. She does note that she has worsening of GI symptoms including dyspepsia, anorexia, worse heartburn, early satiety. She should return here and I would reconsider surgical referral. She told me that she had a colonoscopy previously by [facility] gastroenterology and was recommended to have another this past summer and we will get those results sent over for review independent recommendations.

## 2012-03-28 ENCOUNTER — Other Ambulatory Visit: Payer: Self-pay | Admitting: Internal Medicine

## 2012-03-29 NOTE — Telephone Encounter (Signed)
Ok for each. norvasc can be 6rf

## 2012-03-29 NOTE — Telephone Encounter (Signed)
Pt is requesting refills of medications below:  Please advise.  Requested Medications     Medication name:  Name from pharmacy:  traMADol (ULTRAM) 50 MG tablet  TRAMADOL HCL 50 MG TABLET   Sig: take 1 tablet by mouth at bedtime if needed   Dispense: Not specified Refills: 2 Start: 03/28/2012  Class: Normal   Requested on: 06/16/2011   Originally ordered on: 08/21/2011  Last refill: 02/22/2012  Order History and Details         Medication name:  Name from pharmacy:  ALPRAZolam (XANAX) 0.25 MG tablet  ALPRAZOLAM 0.25 MG TABLET   Sig: take 1 tablet by mouth three times a day if needed   Dispense: Not specified Refills: 1 Start: 03/28/2012  Class: Normal   Requested on: 12/14/2011   Originally ordered on: 08/21/2011  Last refill: 01/27/2012  Order History and Details         Medication name:  Name from pharmacy:  amLODipine (NORVASC) 10 MG tablet  AMLODIPINE BESYLATE 10 MG TAB   Sig: take 1 tablet by mouth once daily (OFFICE VISIT REQUIRED FOR MORE REFILLS)   Dispense: Not specified Refills: 0 Start: 03/28/2012  Class: Normal   Requested on: 12/24/2011   Originally ordered on: 08/21/2011  Last refill: 12/24/2011

## 2012-03-30 NOTE — Telephone Encounter (Signed)
Refills left on pharmacy voice mail 

## 2012-03-31 NOTE — Telephone Encounter (Signed)
Can't tell if was done. If not then rf2

## 2012-03-31 NOTE — Telephone Encounter (Signed)
Rx called in on 03/28/12.

## 2012-04-13 ENCOUNTER — Encounter: Payer: Self-pay | Admitting: Internal Medicine

## 2012-04-13 ENCOUNTER — Other Ambulatory Visit: Payer: Self-pay | Admitting: Internal Medicine

## 2012-04-13 ENCOUNTER — Ambulatory Visit (INDEPENDENT_AMBULATORY_CARE_PROVIDER_SITE_OTHER): Payer: No Typology Code available for payment source | Admitting: Internal Medicine

## 2012-04-13 VITALS — BP 102/72 | HR 81 | Temp 97.6°F | Resp 14 | Wt 141.5 lb

## 2012-04-13 DIAGNOSIS — I10 Essential (primary) hypertension: Secondary | ICD-10-CM

## 2012-04-13 DIAGNOSIS — E039 Hypothyroidism, unspecified: Secondary | ICD-10-CM

## 2012-04-13 DIAGNOSIS — E041 Nontoxic single thyroid nodule: Secondary | ICD-10-CM

## 2012-04-13 NOTE — Assessment & Plan Note (Signed)
Obtain TSH and free T4 

## 2012-04-13 NOTE — Patient Instructions (Signed)
As we discussed please take your losartan at mid day and monitor your blood pressure.

## 2012-04-13 NOTE — Assessment & Plan Note (Signed)
Take losartan at mid day. Monitor blood pressure

## 2012-04-13 NOTE — Progress Notes (Signed)
  Subjective:    Patient ID: [name], female    DOB: August 03, 1934, 77 y.o.   MRN: [id number]  HPI Pt presents to clinic for followup of multiple medical problems. Reports blood pressure low normal in the morning and by evening it is borderline elevated. Compliant with medication without adverse effect. Now status post thyroid nodule biopsy with negative pathology. States she believes her thyroid is currently abnormal chemically and has intermittent sweats. States this is not from her lymphoma and remains with close surveillance through oncology.  Past Medical History  Diagnosis Date  . Hypothyroidism   . Anemia     pt denies  . Osteoporosis   . Pleural effusion, bilateral     history of  . Pulmonary nodules     history of  . History of pancreatitis     pt denies  . Cystocele     history with rectocele, pt denies  . Scoliosis   . Varicose vein   . Non Hodgkin's lymphoma    Past Surgical History  Procedure Date  . Breast surgery 07/30/01    biopsy x 3, bilateral  . Abdominal hysterectomy 04/29/04    partial  . Pacemaker insertion 09/15/11    MDT  . Tonsillectomy     reports that she has never smoked. She has never used smokeless tobacco. She reports that she drinks alcohol. She reports that she does not use illicit drugs. family history includes Heart disease in her daughter; Lymphoma in her daughter; and Stroke in her father and mother. Allergies  Allergen Reactions  . Amoxicillin Hives    Hives  . Codeine Hives    REACTION: "makes her crazy" Hives  . Demerol Hives  . Morphine And Related Hives  . Vicodin (Hydrocodone-Acetaminophen) Hives  . Sulfonamide Derivatives Rash    REACTION: Rash      Review of Systems see hpi     Objective:   Physical Exam  Physical Exam  Nursing note and vitals reviewed. Constitutional: Appears well-developed and well-nourished. No distress.  HENT:  Head: Normocephalic and atraumatic.  Right Ear: External ear normal.  Left  Ear: External ear normal.  Eyes: Conjunctivae are normal. No scleral icterus.  Neck: Neck supple. Carotid bruit is not present.  Cardiovascular: Normal rate, regular rhythm and normal heart sounds.  Exam reveals no gallop and no friction rub.   No murmur heard. Pulmonary/Chest: Effort normal and breath sounds normal. No respiratory distress. He has no wheezes. no rales.  Lymphadenopathy:    He has no cervical adenopathy.  Neurological:Alert.  Skin: Skin is warm and dry. Not diaphoretic.  Psychiatric: Has a normal mood and affect.        Assessment & Plan:

## 2012-04-20 ENCOUNTER — Telehealth: Payer: Self-pay | Admitting: Gastroenterology

## 2012-04-20 NOTE — Telephone Encounter (Signed)
Colonoscopy Dr. Bosie Clos 11/2006 done for "abdominal pain in LLQ, heme pos stool, abnormal barium enema" found left sided diverticulosis, internal hemorrhoids, melanosis.  The examination was otherwise normal.  NO polyps were found.  She was recommended to have repeat colonoscopy at 5 year interval.  I cannot tell why however (no polyps, no FH of colon cancer, no mention of previous polyps in patient).   Patty, Please call her.  I reviewed her 2008 colonoscopy and think proper interval to next screening examination should be 10 years (11/2016).  I am happy to discuss this with her in office if she has any questions or concerns.

## 2012-04-20 NOTE — Telephone Encounter (Signed)
Pt aware and recall in EPIC 

## 2012-05-06 ENCOUNTER — Other Ambulatory Visit: Payer: Self-pay | Admitting: Internal Medicine

## 2012-05-06 NOTE — Telephone Encounter (Signed)
#  30 each med [address]

## 2012-05-06 NOTE — Telephone Encounter (Signed)
Rx[s] to pharmacy/SLS 

## 2012-06-10 ENCOUNTER — Telehealth: Payer: Self-pay | Admitting: *Deleted

## 2012-06-10 NOTE — Telephone Encounter (Signed)
Pt reports inflamed lymph nodes in neck; believed to be asymptomatic infection [not associated with lymphoma], bring in for OV per Vo Dr. Rodena Medin; scheduled OV with Sandford Craze for Monday, 12.16.13 [pt preference to have appointment on Monday]/SLS

## 2012-06-13 ENCOUNTER — Encounter: Payer: Self-pay | Admitting: Family

## 2012-06-13 ENCOUNTER — Ambulatory Visit (INDEPENDENT_AMBULATORY_CARE_PROVIDER_SITE_OTHER): Payer: No Typology Code available for payment source | Admitting: Family

## 2012-06-13 VITALS — BP 110/74 | HR 73 | Temp 97.6°F | Resp 18 | Ht 64.0 in | Wt 145.1 lb

## 2012-06-13 DIAGNOSIS — R590 Localized enlarged lymph nodes: Secondary | ICD-10-CM

## 2012-06-13 DIAGNOSIS — R599 Enlarged lymph nodes, unspecified: Secondary | ICD-10-CM

## 2012-06-13 DIAGNOSIS — R591 Generalized enlarged lymph nodes: Secondary | ICD-10-CM

## 2012-06-13 MED ORDER — CEFUROXIME AXETIL 500 MG PO TABS: 500.0000 mg | ORAL_TABLET | Freq: Two times a day (BID) | ORAL | Status: DC

## 2012-06-13 NOTE — Assessment & Plan Note (Signed)
I have recommended that she contact her oncologist in [place] to arrange follow up and to discuss her concerns about her ongoing LAD- given Hx of non-hodgkin's lymphoma, I think that further work up should be directed by her oncologist.  She is requesting abx which is not unreasonable, though I am not sure that it is going to help. Rx provided for Ceftin.    We discussed referral to a local oncologist and she wishes to discuss with her [facility] oncologist before she will make a decision.  I have asked her to let me know.  Case was reviewed with Dr. Rodena Medin.

## 2012-06-13 NOTE — Progress Notes (Signed)
Subjective:    Patient ID: [name], female    DOB: 02-Nov-1934, 77 y.o.   MRN: [id number]  HPI  [name] is a 77 yr old female who presents today to discuss enlarged lymph nodes in her neck.   She has hx of Non-Hodgkins lymphoma- she was diagnosed last year and is followed in [place].  Reports that she is in remission. She was treated with Rituxan. She had a negative PET scan in June of this year. Chart review also notes that she had a benign thyroid biopsy this fall as well.  Swollen glands- reports that her lab work has been normal. Lymph nodes are non-tender. Have been off/on for years. Reports that she was having similar LAD at the time of her PET scan in June. She is requesting abx for her LAD.    Review of Systems    see HPI  Past Medical History  Diagnosis Date  . Hypothyroidism   . Anemia     pt denies  . Osteoporosis   . Pleural effusion, bilateral     history of  . Pulmonary nodules     history of  . History of pancreatitis     pt denies  . Cystocele     history with rectocele, pt denies  . Scoliosis   . Varicose vein   . Non Hodgkin's lymphoma     History   Social History  . Marital Status: Widowed    Spouse Name: N/A    Number of Children: 3  . Years of Education: N/A   Occupational History  . self employed    Social History Main Topics  . Smoking status: Never Smoker   . Smokeless tobacco: Never Used  . Alcohol Use: Yes     Comment: 1 glass of wine occas  . Drug Use: No  . Sexually Active: Not on file   Other Topics Concern  . Not on file   Social History Narrative  . No narrative on file    Past Surgical History  Procedure Date  . Breast surgery 07/30/01    biopsy x 3, bilateral  . Abdominal hysterectomy 04/29/04    partial  . Pacemaker insertion 09/15/11    MDT  . Tonsillectomy     Family History  Problem Relation Age of Onset  . Heart disease Daughter     congenital "whole in her heart"  . Stroke Mother   . Lymphoma  Daughter     nonhodgkins  . Stroke Father     Allergies  Allergen Reactions  . Amoxicillin Hives    Hives  . Codeine Hives    REACTION: "makes her crazy" Hives  . Demerol Hives  . Morphine And Related Hives  . Vicodin (Hydrocodone-Acetaminophen) Hives  . Sulfonamide Derivatives Rash    REACTION: Rash    Current Outpatient Prescriptions on File Prior to Visit  Medication Sig Dispense Refill  . acetaminophen (TYLENOL) 325 MG tablet Take 325-650 mg by mouth every 4 (four) hours as needed.      . ALPRAZolam (XANAX) 0.25 MG tablet take 1 tablet by mouth three times a day if needed  30 tablet  1  . amLODipine (NORVASC) 10 MG tablet take 1 tablet by mouth once daily (OFFICE VISIT REQUIRED FOR MORE REFILLS)  30 tablet  6  . aspirin 81 MG EC tablet Take 81 mg by mouth daily. Swallow whole.      . denosumab (PROLIA) 60 MG/ML SOLN injection Inject 60 mg  into the skin every 6 (six) months. Used six months ago and is due now...Marland KitchenMarland KitchenAdminister in upper arm, thigh, or abdomen      . levothyroxine (SYNTHROID, LEVOTHROID) 100 MCG tablet Take 100 mcg by mouth daily.      [name] losartan (COZAAR) 50 MG tablet Take 0.5 tablets (25 mg total) by mouth daily.  45 tablet  1  . omeprazole (PRILOSEC) 40 MG capsule Take 1 capsule (40 mg total) by mouth daily.  30 capsule  3  . traMADol (ULTRAM) 50 MG tablet take 1 tablet by mouth at bedtime if needed  30 tablet  1  . dronedarone (MULTAQ) 400 MG tablet Take 400 mg by mouth daily.        BP 110/74  Pulse 73  Temp 97.6 F (36.4 C) (Oral)  Resp 18  Ht 5\' 4"  (1.626 m)  Wt 145 lb 1.9 oz (65.826 kg)  BMI 24.91 kg/m2  SpO2 97%    Objective:   Physical Exam  Constitutional: She is oriented to person, place, and time. She appears well-developed and well-nourished. No distress.  HENT:  Head: Normocephalic and atraumatic.  Right Ear: Tympanic membrane and ear canal normal.  Left Ear: Tympanic membrane and ear canal normal.  Mouth/Throat: No oropharyngeal  exudate, posterior oropharyngeal edema or posterior oropharyngeal erythema.       Firm mass on hard palate ("since birth")   Neck:       Palpable right posterior cervical lymph node.  Mobile, non-tender, pea sized (<1cm diameter)  Cardiovascular: Normal rate and regular rhythm.   No murmur heard. Neurological: She is alert and oriented to person, place, and time.  Skin: Skin is warm and dry.  Psychiatric: She has a normal mood and affect. Her behavior is normal. Judgment and thought content normal.          Assessment & Plan:

## 2012-06-13 NOTE — Patient Instructions (Addendum)
Complete your blood work prior to leaving. Please call your oncologist in [place] to arrange follow up and notify them about your enlarged lymph nodes. Let me know if you decide that you would like to establish with a local oncologist and I will be happy to arrange. Follow up with Dr. Rodena Medin in 1 month.

## 2012-06-14 ENCOUNTER — Telehealth: Payer: Self-pay | Admitting: *Deleted

## 2012-06-14 DIAGNOSIS — C8590 Non-Hodgkin lymphoma, unspecified, unspecified site: Secondary | ICD-10-CM

## 2012-06-14 LAB — CBC WITH DIFFERENTIAL/PLATELET
Eosinophils Absolute: 0.2 10*3/uL (ref 0.0–0.7)
Eosinophils Relative: 4 % (ref 0–5)
HCT: 38.9 % (ref 36.0–46.0)
Hemoglobin: 13.1 g/dL (ref 12.0–15.0)
Lymphocytes Relative: 35 % (ref 12–46)
Lymphs Abs: 2.2 10*3/uL (ref 0.7–4.0)
MCH: 30.8 pg (ref 26.0–34.0)
MCV: 91.3 fL (ref 78.0–100.0)
Monocytes Relative: 12 % (ref 3–12)
Platelets: 338 10*3/uL (ref 150–400)
RBC: 4.26 MIL/uL (ref 3.87–5.11)
WBC: 6.3 10*3/uL (ref 4.0–10.5)

## 2012-06-14 NOTE — Telephone Encounter (Signed)
Dr. Myna Hidalgo is only oncologist at [facility].  Would she like to go to the Cancer center in [place]?

## 2012-06-14 NOTE — Telephone Encounter (Signed)
Pt left message that she would like to be referred to a local oncologist. Wants to know if there is another doctor at the cancer center here that she could see other than Dr Myna Hidalgo?  Please advise.

## 2012-06-15 NOTE — Telephone Encounter (Signed)
Notified pt and she is agreeable to referral to Cancer Center in [place].

## 2012-06-16 ENCOUNTER — Telehealth: Payer: Self-pay | Admitting: Hematology & Oncology

## 2012-06-16 NOTE — Telephone Encounter (Signed)
Received referral from [facility] for pt yesterday. Per note 12-18 pt wants to go to [place]. Called Myriam Jacobson to verify she said pt does want to go to [facility] and she will change referral.

## 2012-06-20 ENCOUNTER — Telehealth: Payer: Self-pay | Admitting: Internal Medicine

## 2012-06-20 NOTE — Telephone Encounter (Signed)
C/D 06/20/12 for appt.07/04/12

## 2012-07-04 ENCOUNTER — Other Ambulatory Visit: Payer: No Typology Code available for payment source | Admitting: Lab

## 2012-07-04 ENCOUNTER — Ambulatory Visit (HOSPITAL_BASED_OUTPATIENT_CLINIC_OR_DEPARTMENT_OTHER): Payer: No Typology Code available for payment source | Admitting: Internal Medicine

## 2012-07-04 ENCOUNTER — Encounter: Payer: Self-pay | Admitting: Internal Medicine

## 2012-07-04 ENCOUNTER — Telehealth: Payer: Self-pay | Admitting: Internal Medicine

## 2012-07-04 ENCOUNTER — Other Ambulatory Visit: Payer: Self-pay | Admitting: Medical Oncology

## 2012-07-04 ENCOUNTER — Ambulatory Visit (HOSPITAL_BASED_OUTPATIENT_CLINIC_OR_DEPARTMENT_OTHER): Payer: No Typology Code available for payment source

## 2012-07-04 VITALS — BP 132/83 | HR 75 | Temp 97.9°F | Resp 20 | Ht 62.5 in | Wt 145.6 lb

## 2012-07-04 DIAGNOSIS — I10 Essential (primary) hypertension: Secondary | ICD-10-CM

## 2012-07-04 DIAGNOSIS — C8291 Follicular lymphoma, unspecified, lymph nodes of head, face, and neck: Secondary | ICD-10-CM

## 2012-07-04 DIAGNOSIS — C8590 Non-Hodgkin lymphoma, unspecified, unspecified site: Secondary | ICD-10-CM

## 2012-07-04 DIAGNOSIS — M810 Age-related osteoporosis without current pathological fracture: Secondary | ICD-10-CM

## 2012-07-04 DIAGNOSIS — E039 Hypothyroidism, unspecified: Secondary | ICD-10-CM

## 2012-07-04 NOTE — Progress Notes (Signed)
Checked in new patient. No financial issues. °

## 2012-07-04 NOTE — Telephone Encounter (Signed)
Gave pt appt for lab and MD on June and July 2014 gave Lilyan Punt to precert insurance for PET Scan

## 2012-07-04 NOTE — Progress Notes (Signed)
[facility] Telephone:([phone]) [phone]   Fax:([phone]  CONSULT NOTE  REFERRING PHYSICIAN: Sandford Craze, NP  REASON FOR CONSULTATION:  78 years old white female with history of non-Hodgkin lymphoma.  HPI [name] is a 78 y.o. female was past medical history significant for hypertension, hypothyroidism, GERD as well as history of pacemaker placement for irregular heart rate and a diagnosis of non-Hodgkin lymphoma in November of 2012. The patient mentions that in the fall of 2012, she felt a lump in the right neck area. She was seen by her primary care physician and a core biopsy was performed. The initial pathology from [facility] pathology Department indicated mantle cell lymphoma. The immunohistochemical stain showed SOX 11 positive, CD5 positive, CD20 negative, cyclin D1 negative. And the FISH study was negative for BCL 1/IGH t(11;14). The patient was seen initially by Dr. Gypsy Lore at [facility] and was referred to Dr. Rhea Pink at [facility] for evaluation. The patient was considered initially for treatment with chemotherapy with R- CHOP, versus R.-hyper CVAD versus autologous peripheral blood stem cell transplant. Upon reviewing her pathology and repeat the immunohistochemical stains and the absence of translocation (11;14), the characteristic of mantle cell lymphoma was not detected neither therre was translocation of (14;18) associated with follicular lymphoma. The final pathology diagnosis was changed to follicular lymphoma. The patient was concerned about the rash to proceed with autologous peripheral blood stem cell transplant at [facility] and she decided to seek medical attention at [facility]. She was treated with Rituxan and every week x3 doses in addition to 3 tablets of Afinitor completed in June of 2013. Initial PET scan on 06/12/2011 showed diffused FDG uptake  in the thyroid gland likely due to inflammation/thyroiditis but there was no FDG positive neck nodes and no significant abnormalities in the chest, abdomen or pelvis. Repeat PET scan on 12/09/2011 was stable with no evidence for hypermetabolic lymphadenopathy and no change in the diffused FDG accumulation within the thyroid parenchyma. The patient did not have any followup appointment with her oncologist at [facility] or at [facility]. She was seen recently by her primary care physician and was referred to me today for evaluation and recommendation regarding her non-Hodgkin lymphoma. The patient was also recently found to have right thyroid nodules an ultrasound guided biopsy of the thyroid gland performed on 03/15/2012 showed findings consistent with nonneoplastic goiter. The patient is feeling fine today with no specific complaints except for soreness in her mouth which improved after treatment with antibiotics by her PCP. She also still complaining of hot flashes for the last 15 years after she discontinued her hormonal therapy. She has shortness breath with exertion but no significant weight loss.  Her family history significant for a father who died from a stroke at age 45, mother died from old age at age 5 and a daughter who had non-Hodgkin lymphoma. The patient is a widow and has 3 children. 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 She has no history of smoking but drinks alcohol occasionally and no history of drug abuse. @SFHPI @  Past Medical History  Diagnosis Date  . Hypothyroidism   . Anemia     pt denies  . Osteoporosis   . Pleural effusion, bilateral     history of  . Pulmonary nodules     history of  . History of pancreatitis  pt denies  . Cystocele     history with rectocele, pt denies  . Scoliosis   . Varicose vein   . Non Hodgkin's lymphoma     Past Surgical History  Procedure Date  . Breast surgery 07/30/01    biopsy  x 3, bilateral  . Abdominal hysterectomy 04/29/04    partial  . Pacemaker insertion 09/15/11    MDT  . Tonsillectomy     Family History  Problem Relation Age of Onset  . Heart disease Daughter     congenital "whole in her heart"  . Stroke Mother   . Lymphoma Daughter     nonhodgkins  . Stroke Father     Social History History  Substance Use Topics  . Smoking status: Never Smoker   . Smokeless tobacco: Never Used  . Alcohol Use: Yes     Comment: 1 glass of wine occas    Allergies  Allergen Reactions  . Amoxicillin Hives    Hives  . Codeine Hives    REACTION: "makes her crazy" Hives  . Demerol Hives  . Morphine And Related Hives  . Vicodin (Hydrocodone-Acetaminophen) Hives  . Sulfonamide Derivatives Rash    REACTION: Rash    Current Outpatient Prescriptions  Medication Sig Dispense Refill  . ALPRAZolam (XANAX) 0.25 MG tablet take 1 tablet by mouth three times a day if needed  30 tablet  1  . amLODipine (NORVASC) 10 MG tablet take 1 tablet by mouth once daily (OFFICE VISIT REQUIRED FOR MORE REFILLS)  30 tablet  6  . aspirin 81 MG EC tablet Take 81 mg by mouth daily. Swallow whole.      . denosumab (PROLIA) 60 MG/ML SOLN injection Inject 60 mg into the skin every 6 (six) months. Used six months ago and is due now...Marland KitchenMarland KitchenAdminister in upper arm, thigh, or abdomen      . levothyroxine (SYNTHROID, LEVOTHROID) 100 MCG tablet Take 100 mcg by mouth daily.      [name] losartan (COZAAR) 50 MG tablet Take 0.5 tablets (25 mg total) by mouth daily.  45 tablet  1  . omeprazole (PRILOSEC) 40 MG capsule Take 1 capsule (40 mg total) by mouth daily.  30 capsule  3  . traMADol (ULTRAM) 50 MG tablet take 1 tablet by mouth at bedtime if needed  30 tablet  1  . acetaminophen (TYLENOL) 325 MG tablet Take 325-650 mg by mouth every 4 (four) hours as needed.        Review of Systems  A comprehensive review of systems was negative except for: Constitutional: positive for Hot flashes Respiratory:  positive for dyspnea on exertion  Physical Exam  ZOX:WRUEA, healthy, no distress, well nourished and well developed SKIN: skin color, texture, turgor are normal HEAD: Normocephalic, No masses, lesions, tenderness or abnormalities EYES: normal, PERRLA EARS: External ears normal OROPHARYNX:no exudate and no erythema  NECK: supple LYMPH:  no palpable lymphadenopathy, no hepatosplenomegaly BREAST:not examined LUNGS: clear to auscultation  HEART: regular rate & rhythm and no murmurs ABDOMEN:abdomen soft, non-tender, normal bowel sounds and no masses or organomegaly BACK: Back symmetric, no curvature. EXTREMITIES:no edema, no skin discoloration, no clubbing  NEURO: alert & oriented x 3 with fluent speech, no focal motor/sensory deficits  PERFORMANCE STATUS: ECOG 1  LABORATORY DATA: Lab Results  Component Value Date   WBC 6.3 06/13/2012   HGB 13.1 06/13/2012   HCT 38.9 06/13/2012   MCV 91.3 06/13/2012   PLT 338 06/13/2012      Chemistry  Component Value Date/Time   NA 142 01/07/2012 1014   K 4.3 01/07/2012 1014   CL 105 01/07/2012 1014   CO2 27 01/07/2012 1014   BUN 11 01/07/2012 1014   CREATININE 0.94 01/07/2012 1014   CREATININE 0.67 09/29/2011 1854      Component Value Date/Time   CALCIUM 9.7 01/07/2012 1014   ALKPHOS 64 01/07/2012 1014   AST 30 01/07/2012 1014   ALT 22 01/07/2012 1014   BILITOT 0.4 01/07/2012 1014       RADIOGRAPHIC STUDIES: No results found.  ASSESSMENT: This is a very pleasant 78 years old white female diagnosed with follicular lymphoma in December of 2012 status post treatment with Rituxan x3 doses as well as Afinitor. She has been observation since June of 2013 was no evidence for disease recurrence but her last imaging studies was in June of 2013.  PLAN: I have a lengthy discussion with the patient today about her condition. I recommended for her to have repeat PET scan for restaging of her disease and to have it as a baseline after she established  care with me today. If no evidence for disease recurrence on the PET scan, I would see the patient back for followup visit in 6 months with repeat CBC, comprehensive metabolic panel and LDH. The patient is currently asymptomatic and she can be followed by observation. She was advised to call me immediately if he has any concerning symptoms in the interval.  All questions were answered. The patient knows to call the clinic with any problems, questions or concerns. We can certainly see the patient much sooner if necessary.  Thank you so much for allowing me to participate in the care of [name]. I will continue to follow up the patient with you and assist in her care.  I spent 25 minutes counseling the patient face to face. The total time spent in the appointment was 50 minutes.  Caralee Morea K. 07/04/2012, 3:12 PM

## 2012-07-04 NOTE — Patient Instructions (Signed)
You have a diagnosis of follicular non-Hodgkin lymphoma. I will order repeat PET scan for restaging of her disease. Followup in 6 months if no evidence for disease recurrence on the PET scan.

## 2012-07-08 ENCOUNTER — Emergency Department (HOSPITAL_COMMUNITY)
Admission: EM | Admit: 2012-07-08 | Discharge: 2012-07-09 | Disposition: A | Discharge status: Home / Self Care | Payer: No Typology Code available for payment source | Attending: Emergency Medicine | Admitting: Emergency Medicine

## 2012-07-08 DIAGNOSIS — E039 Hypothyroidism, unspecified: Secondary | ICD-10-CM | POA: Insufficient documentation

## 2012-07-08 DIAGNOSIS — Z862 Personal history of diseases of the blood and blood-forming organs and certain disorders involving the immune mechanism: Secondary | ICD-10-CM | POA: Insufficient documentation

## 2012-07-08 DIAGNOSIS — Z8719 Personal history of other diseases of the digestive system: Secondary | ICD-10-CM | POA: Insufficient documentation

## 2012-07-08 DIAGNOSIS — Z7982 Long term (current) use of aspirin: Secondary | ICD-10-CM | POA: Insufficient documentation

## 2012-07-08 DIAGNOSIS — Z8739 Personal history of other diseases of the musculoskeletal system and connective tissue: Secondary | ICD-10-CM | POA: Insufficient documentation

## 2012-07-08 DIAGNOSIS — Z79899 Other long term (current) drug therapy: Secondary | ICD-10-CM | POA: Insufficient documentation

## 2012-07-08 DIAGNOSIS — R002 Palpitations: Secondary | ICD-10-CM

## 2012-07-08 DIAGNOSIS — Z950 Presence of cardiac pacemaker: Secondary | ICD-10-CM | POA: Insufficient documentation

## 2012-07-08 DIAGNOSIS — M810 Age-related osteoporosis without current pathological fracture: Secondary | ICD-10-CM | POA: Insufficient documentation

## 2012-07-08 LAB — CBC
HCT: 36.2 % (ref 36.0–46.0)
Hemoglobin: 11.8 g/dL — ABNORMAL LOW (ref 12.0–15.0)
MCH: 29.9 pg (ref 26.0–34.0)
MCHC: 32.6 g/dL (ref 30.0–36.0)
MCV: 91.6 fL (ref 78.0–100.0)
Platelets: 383 10*3/uL (ref 150–400)
RBC: 3.95 MIL/uL (ref 3.87–5.11)
RDW: 14.1 % (ref 11.5–15.5)
WBC: 5.9 10*3/uL (ref 4.0–10.5)

## 2012-07-08 NOTE — ED Provider Notes (Signed)
History     CSN: [id number]  Arrival date & time 07/08/12  2222   First MD Initiated Contact with Patient 07/08/12 2348      Chief Complaint  Patient presents with  . Palpitations    (Consider location/radiation/quality/duration/timing/severity/associated sxs/prior treatment) HPI Hx per PT, palpitations tonight - feels like my pacemaker needs adjustment, h/o same x 1 requiring her cardiologist to make adjustments. Symptoms onset 4pm last seconds and is continuous. No Cp, sl SOB with symptoms, no leg pain or swelling, no recent illness, no cough. MOD in severity. No new medications, no known h/o CAD Past Medical History  Diagnosis Date  . Hypothyroidism   . Anemia     pt denies  . Osteoporosis   . Pleural effusion, bilateral     history of  . Pulmonary nodules     history of  . History of pancreatitis     pt denies  . Cystocele     history with rectocele, pt denies  . Scoliosis   . Varicose vein   . Non Hodgkin's lymphoma     Past Surgical History  Procedure Date  . Breast surgery 07/30/01    biopsy x 3, bilateral  . Abdominal hysterectomy 04/29/04    partial  . Pacemaker insertion 09/15/11    MDT  . Tonsillectomy     Family History  Problem Relation Age of Onset  . Heart disease Daughter     congenital "whole in her heart"  . Stroke Mother   . Lymphoma Daughter     nonhodgkins  . Stroke Father     History  Substance Use Topics  . Smoking status: Never Smoker   . Smokeless tobacco: Never Used  . Alcohol Use: Yes     Comment: 1 glass of wine occas    OB History    Grav Para Term Preterm Abortions TAB SAB Ect Mult Living                  Review of Systems  Constitutional: Negative for fever and chills.  HENT: Negative for neck pain and neck stiffness.   Eyes: Negative for pain.  Respiratory: Negative for cough.   Cardiovascular: Positive for palpitations. Negative for chest pain.  Gastrointestinal: Negative for abdominal pain.  Genitourinary:  Negative for dysuria.  Musculoskeletal: Negative for back pain.  Skin: Negative for rash.  Neurological: Negative for headaches.  All other systems reviewed and are negative.    Allergies  Amoxicillin; Codeine; Demerol; Morphine and related; Vicodin; and Sulfonamide derivatives  Home Medications   Current Outpatient Rx  Name  Route  Sig  Dispense  Refill  . ACETAMINOPHEN 325 MG PO TABS   Oral   Take 325-650 mg by mouth every 4 (four) hours as needed. pain         . ALPRAZOLAM 0.25 MG PO TABS   Oral   Take 0.25 mg by mouth 3 (three) times daily as needed. anxiety         . AMLODIPINE BESYLATE 10 MG PO TABS   Oral   Take 10 mg by mouth every morning.         . ASPIRIN 81 MG PO TBEC   Oral   Take 81 mg by mouth every evening. Swallow whole.         [name] LEVOTHYROXINE SODIUM 100 MCG PO TABS   Oral   Take 100 mcg by mouth every morning.          [name] LOSARTAN  POTASSIUM 50 MG PO TABS   Oral   Take 25 mg by mouth every morning.         [name] OMEPRAZOLE 40 MG PO CPDR   Oral   Take 40 mg by mouth 2 (two) times daily.           BP 147/83  Pulse 65  Temp 97.7 F (36.5 C) (Oral)  Resp 16  SpO2 97%  Physical Exam  Constitutional: She is oriented to person, place, and time. She appears well-developed and well-nourished.  HENT:  Head: Normocephalic and atraumatic.  Eyes: Conjunctivae normal and EOM are normal. Pupils are equal, round, and reactive to light.  Neck: Trachea normal. Neck supple. No thyromegaly present.  Cardiovascular: Normal rate, regular rhythm, S1 normal, S2 normal and normal pulses.     No systolic murmur is present   No diastolic murmur is present  Pulses:      Radial pulses are 2+ on the right side, and 2+ on the left side.  Pulmonary/Chest: Effort normal and breath sounds normal. She has no wheezes. She has no rhonchi. She has no rales. She exhibits no tenderness.  Abdominal: Soft. Normal appearance and bowel sounds are normal. There is no  tenderness. There is no CVA tenderness and negative Murphy's sign.  Musculoskeletal:       BLE:s Calves nontender, no cords or erythema, negative Homans sign  Neurological: She is alert and oriented to person, place, and time. She has normal strength. No cranial nerve deficit or sensory deficit. GCS eye subscore is 4. GCS verbal subscore is 5. GCS motor subscore is 6.  Skin: Skin is warm and dry. No rash noted. She is not diaphoretic.  Psychiatric: Her speech is normal.       Cooperative and appropriate    ED Course  Procedures (including critical care time)  Results for orders placed during the hospital encounter of 07/08/12  TROPONIN I      Component Value Range   Troponin I <0.30  <0.30 ng/mL  CBC      Component Value Range   WBC 5.9  4.0 - 10.5 K/uL   RBC 3.95  3.87 - 5.11 MIL/uL   Hemoglobin 11.8 (*) 12.0 - 15.0 g/dL   HCT 16.1  09.6 - 04.5 %   MCV 91.6  78.0 - 100.0 fL   MCH 29.9  26.0 - 34.0 pg   MCHC 32.6  30.0 - 36.0 g/dL   RDW 40.9  81.1 - 91.4 %   Platelets 383  150 - 400 K/uL  BASIC METABOLIC PANEL      Component Value Range   Sodium 139  135 - 145 mEq/L   Potassium 3.7  3.5 - 5.1 mEq/L   Chloride 103  96 - 112 mEq/L   CO2 27  19 - 32 mEq/L   Glucose, Bld 94  70 - 99 mg/dL   BUN 19  6 - 23 mg/dL   Creatinine, Ser 7.82  0.50 - 1.10 mg/dL   Calcium 9.5  8.4 - 95.6 mg/dL   GFR calc non Af Amer 69 (*) >90 mL/min   GFR calc Af Amer 80 (*) >90 mL/min   Dg Chest 2 View  07/09/2012  *RADIOLOGY REPORT*  Clinical Data: Palpitations  CHEST - 2 VIEW  Comparison: Chest radiograph 09/24/2011  Findings: Left-sided pacemaker overlies stable enlarged heart silhouette.  There is a large hiatal hernia again demonstrated posterior to the heart.  No effusion, infiltrate, or pneumothorax.  IMPRESSION:  1.  No acute cardiopulmonary findings. 2.  Large hiatal hernia.   Original Report Authenticated By: Genevive Bi, M.D.      Date: 07/08/2012  Rate: 62  Rhythm: normal sinus  rhythm  QRS Axis: normal  Intervals: normal  ST/T Wave abnormalities: nonspecific ST changes  Conduction Disutrbances:none  Narrative Interpretation:   Old EKG Reviewed: changes noted - previous ECG shows obvious pacer spikes  1:02 AM [employer] rep looking at transmitted Pacer data, no abnormalities in pacer noted, no arrythmias, info faxed to PTs records and will be sent to her cardiologist, on recheck she is feeling better. On further history has known thyroid disease and agrees to have TSH and recheck with her PCP.   MDM  Palpitations in PT with Pacemaker followed by CAR, evaluated with labs, CXR and ECG as above, Pacer interrogation and no abnormalities found, no electrolyte abnormalities and on monitor in paced. Palpitations resolved, doubt ACS no h/o CAD, VS and nursing notes reviewed.         Sunnie Nielsen, MD 07/09/12 [phone]

## 2012-07-08 NOTE — ED Notes (Signed)
Pt c/o heart racing at 4pm today but feels fine now. Pt was seen for same event last year when she sts this happended.VSS. Skin PWD

## 2012-07-08 NOTE — ED Notes (Addendum)
EKG old and new given to EDP, Juleen China, MD.

## 2012-07-09 ENCOUNTER — Emergency Department (HOSPITAL_COMMUNITY): Payer: No Typology Code available for payment source

## 2012-07-09 LAB — TROPONIN I: Troponin I: <0.3 ng/mL (ref <0.30)

## 2012-07-09 LAB — BASIC METABOLIC PANEL
BUN: 19 mg/dL (ref 6–23)
Chloride: 103 mEq/L (ref 96–112)
GFR calc Af Amer: 80 mL/min — ABNORMAL LOW (ref >90)
Potassium: 3.7 mEq/L (ref 3.5–5.1)
Sodium: 139 mEq/L (ref 135–145)

## 2012-07-09 NOTE — ED Notes (Signed)
[employer] transmission complete, initial report given to Dr. Dierdre Highman

## 2012-07-13 ENCOUNTER — Encounter (HOSPITAL_COMMUNITY)
Admission: RE | Admit: 2012-07-13 | Discharge: 2012-07-13 | Disposition: A | Discharge status: Home / Self Care | Payer: No Typology Code available for payment source | Source: Ambulatory Visit | Attending: Internal Medicine | Admitting: Internal Medicine

## 2012-07-13 ENCOUNTER — Ambulatory Visit: Payer: No Typology Code available for payment source | Admitting: Internal Medicine

## 2012-07-13 DIAGNOSIS — C8590 Non-Hodgkin lymphoma, unspecified, unspecified site: Secondary | ICD-10-CM

## 2012-07-13 DIAGNOSIS — C8589 Other specified types of non-Hodgkin lymphoma, extranodal and solid organ sites: Secondary | ICD-10-CM | POA: Insufficient documentation

## 2012-07-13 MED ORDER — FLUDEOXYGLUCOSE F - 18 (FDG) INJECTION
18.1000 | Freq: Once | INTRAVENOUS | Status: AC | PRN
  Administered 2012-07-13: 18.1 via INTRAVENOUS

## 2012-07-14 ENCOUNTER — Encounter: Payer: Self-pay | Admitting: Internal Medicine

## 2012-07-14 ENCOUNTER — Ambulatory Visit (INDEPENDENT_AMBULATORY_CARE_PROVIDER_SITE_OTHER): Payer: No Typology Code available for payment source | Admitting: Internal Medicine

## 2012-07-14 VITALS — BP 116/74 | HR 62 | Temp 98.2°F | Resp 14 | Wt 145.2 lb

## 2012-07-14 DIAGNOSIS — M549 Dorsalgia, unspecified: Secondary | ICD-10-CM

## 2012-07-14 DIAGNOSIS — I10 Essential (primary) hypertension: Secondary | ICD-10-CM

## 2012-07-14 DIAGNOSIS — E039 Hypothyroidism, unspecified: Secondary | ICD-10-CM

## 2012-07-14 LAB — GLUCOSE, CAPILLARY: Glucose-Capillary: 93 mg/dL (ref 70–99)

## 2012-07-14 MED ORDER — OMEPRAZOLE 40 MG PO CPDR: 40.0000 mg | DELAYED_RELEASE_CAPSULE | Freq: Two times a day (BID) | ORAL | Status: DC

## 2012-07-14 MED ORDER — TRAMADOL HCL 50 MG PO TABS: 50.0000 mg | ORAL_TABLET | Freq: Three times a day (TID) | ORAL | Status: DC | PRN

## 2012-07-14 NOTE — Assessment & Plan Note (Signed)
Stable. Refill ultram for prn use

## 2012-07-14 NOTE — Assessment & Plan Note (Signed)
Normotensive and stable. Continue current regimen. Monitor bp as outpt and followup in clinic as scheduled.  

## 2012-07-14 NOTE — Progress Notes (Signed)
  Subjective:    Patient ID: [name], female    DOB: [birth year], 78 y.o.   MRN: [id number]  HPI Pt presents to clinic for followup of multiple medical problems. Known h/o NHL followed by H/O with recent PET scan pending. BP reviewed as normotensive. Had recent ED visit for palpitations. EKG unremarkable and underwent reported unremarkable PM interrogation. Wonders if related to her thyroid. Requests refill of ultram which she uses on a sparing basis without side effect.   Past Medical History  Diagnosis Date  . Hypothyroidism   . Anemia     pt denies  . Osteoporosis   . Pleural effusion, bilateral     history of  . Pulmonary nodules     history of  . History of pancreatitis     pt denies  . Cystocele     history with rectocele, pt denies  . Scoliosis   . Varicose vein   . Non Hodgkin's lymphoma    Past Surgical History  Procedure Date  . Breast surgery 07/30/01    biopsy x 3, bilateral  . Abdominal hysterectomy 04/29/04    partial  . Pacemaker insertion 09/15/11    MDT  . Tonsillectomy     reports that she has never smoked. She has never used smokeless tobacco. She reports that she drinks alcohol. She reports that she does not use illicit drugs. family history includes Heart disease in her daughter; Lymphoma in her daughter; and Stroke in her father and mother. Allergies  Allergen Reactions  . Amoxicillin Hives    Hives  . Codeine Hives    REACTION: "makes her crazy" Hives  . Demerol Hives  . Morphine And Related Hives  . Vicodin (Hydrocodone-Acetaminophen) Hives  . Sulfonamide Derivatives Rash    REACTION: Rash      Review of Systems see hpi     Objective:   Physical Exam  Physical Exam  Nursing note and vitals reviewed. Constitutional: Appears well-developed and well-nourished. No distress.  HENT:  Head: Normocephalic and atraumatic.  Right Ear: External ear normal.  Left Ear: External ear normal.  Eyes: Conjunctivae are normal. No scleral  icterus.  Neck: Neck supple. Carotid bruit is not present.  Cardiovascular: Normal rate, regular rhythm and normal heart sounds.  Exam reveals no gallop and no friction rub.   No murmur heard. Pulmonary/Chest: Effort normal and breath sounds normal. No respiratory distress. He has no wheezes. no rales.  Lymphadenopathy:    He has no cervical adenopathy.  Neurological:Alert.  Skin: Skin is warm and dry. Not diaphoretic.  Psychiatric: Has a normal mood and affect.        Assessment & Plan:

## 2012-07-14 NOTE — Assessment & Plan Note (Signed)
Obtain tsh/free t4 secondary to recent palpitations

## 2012-07-15 LAB — T4, FREE: Free T4: 1.39 ng/dL (ref 0.80–1.80)

## 2012-07-15 LAB — TSH: TSH: 2.138 u[IU]/mL (ref 0.350–4.500)

## 2012-07-18 NOTE — Progress Notes (Signed)
  Subjective:    Patient ID: [name], female    DOB: 01/19/1935, 78 y.o.   MRN: [id number]  HPI appt cancelled    Review of Systems     Objective:   Physical Exam        Assessment & Plan:

## 2012-07-25 ENCOUNTER — Telehealth: Payer: Self-pay | Admitting: Medical Oncology

## 2012-07-25 NOTE — Telephone Encounter (Signed)
Had PET scan Jan 6th and has not heard anything . Her next appt is 6 months. SHe wants results. I will notify Dr Arbutus Ped.

## 2012-07-25 NOTE — Telephone Encounter (Signed)
Pt notified of PET scan result.

## 2012-07-25 NOTE — Telephone Encounter (Signed)
Yes

## 2012-09-27 ENCOUNTER — Other Ambulatory Visit: Payer: Self-pay | Admitting: Internal Medicine

## 2012-09-27 NOTE — Telephone Encounter (Signed)
Rx request to pharmacy/SLS  

## 2012-10-04 ENCOUNTER — Encounter (INDEPENDENT_AMBULATORY_CARE_PROVIDER_SITE_OTHER): Payer: Self-pay

## 2012-10-04 ENCOUNTER — Encounter (INDEPENDENT_AMBULATORY_CARE_PROVIDER_SITE_OTHER): Payer: Self-pay | Admitting: Surgery

## 2012-10-04 ENCOUNTER — Ambulatory Visit (INDEPENDENT_AMBULATORY_CARE_PROVIDER_SITE_OTHER): Payer: PRIVATE HEALTH INSURANCE | Admitting: Surgery

## 2012-10-04 VITALS — BP 102/64 | HR 70 | Resp 18 | Ht 64.0 in | Wt 145.0 lb

## 2012-10-04 DIAGNOSIS — E041 Nontoxic single thyroid nodule: Secondary | ICD-10-CM

## 2012-10-04 DIAGNOSIS — R001 Bradycardia, unspecified: Secondary | ICD-10-CM

## 2012-10-04 DIAGNOSIS — C8589 Other specified types of non-Hodgkin lymphoma, extranodal and solid organ sites: Secondary | ICD-10-CM

## 2012-10-04 DIAGNOSIS — R590 Localized enlarged lymph nodes: Secondary | ICD-10-CM

## 2012-10-04 DIAGNOSIS — F419 Anxiety disorder, unspecified: Secondary | ICD-10-CM

## 2012-10-04 DIAGNOSIS — C8590 Non-Hodgkin lymphoma, unspecified, unspecified site: Secondary | ICD-10-CM

## 2012-10-04 DIAGNOSIS — R599 Enlarged lymph nodes, unspecified: Secondary | ICD-10-CM

## 2012-10-04 NOTE — Progress Notes (Addendum)
Subjective:     Patient ID: [name], female   DOB: 18-Sep-1934, 78 y.o.   MRN: [id number]  HPI  [name]  May 29, 1935 [id number]  Patient Care Team: Edwyna Perfect, MD as PCP - General (Internal Medicine) Storm Frisk, MD as Attending Physician (Pulmonary Disease) Izell Mescalero, MD as Consulting Physician (Endocrinology)  This patient is a 78 y.o.female who presents today for surgical evaluation at the request of Dr. Donnamae Jude, [facility]   Reason for visit: Persistent neck lymph node.  History of lymphoma.  Elderly woman with numerous health issues.  Lives in [place].  Tends to get care in [place] but has seen many MDs at different locations.  Saw her ophthalmologist who mentioned a lymph node on her neck.  She wished to proceed by a Development worker, international aid.  History of lymphoma.  She claims to have been seen at [facility].  Also evaluated in [place] [facility].  Initially thought to be a mantle cell lymphoma.  Reevaluation more consistent with a follicular lymphoma.  Treated with chemotherapy.  Established with a new oncologist, Dr. Gwenyth Bouillon, in January 2014.  PET scan at that time showed no concerning active lymphadenopathy.  Therefore consistent with remission of lymphoma.  However patient's concerned about her neck mass.  Claims to have some sinusitis and a cough.  Claims that she is not able to see her primary care physician anymore & wondered if I can do anything.  Cannot recall her physicians very well: "Isn't in my record?"  Did not fill out any of her history paperwork.  Patient Active Problem List  Diagnosis  . THYROID NODULE, RIGHT  . HYPOTHYROIDISM  . ADJUSTMENT DISORDER WITH DEPRESSED MOOD  . DIASTOLIC DYSFUNCTION, on 2D Jan 2011 (grade 1)  . GERD  . BACK PAIN  . OSTEOPOROSIS  . HOARSENESS  . DYSPNEA ON EXERTION  . PAP SMEAR, ABNORMAL  . Hypertension  . Sinus bradycardia, symptomatic, s/p MDT PTVDP 09/15/11  . NHL  (non-Hodgkin's lymphoma)-recent diagnosis  . Anxiety  . Dyslipidemia, LDL 136  . Hiatal hernia, large by CXR  . Scoliosis  . Pericardial effusion after pacemaker, hospitalized at [facility] 09/25/11  . Lymphadenopathy, cervical    Past Medical History  Diagnosis Date  . Hypothyroidism   . Anemia     pt denies  . Osteoporosis   . Pleural effusion, bilateral     history of  . Pulmonary nodules     history of  . History of pancreatitis     pt denies  . Cystocele     history with rectocele, pt denies  . Scoliosis   . Varicose vein   . Non Hodgkin's lymphoma     Past Surgical History  Procedure Laterality Date  . Breast surgery  07/30/01    biopsy x 3, bilateral  . Abdominal hysterectomy  04/29/04    partial  . Pacemaker insertion  09/15/11    MDT  . Tonsillectomy      History   Social History  . Marital Status: Widowed    Spouse Name: N/A    Number of Children: 3  . Years of Education: N/A   Occupational History  . self employed    Social History Main Topics  . Smoking status: Never Smoker   . Smokeless tobacco: Never Used  . Alcohol Use: Yes     Comment: 1 glass of wine occas  . Drug Use: No  . Sexually Active:  Not on file   Other Topics Concern  . Not on file   Social History Narrative  . No narrative on file    Family History  Problem Relation Age of Onset  . Heart disease Daughter     congenital "whole in her heart"  . Stroke Mother   . Lymphoma Daughter     nonhodgkins  . Stroke Father     Current Outpatient Prescriptions  Medication Sig Dispense Refill  . acetaminophen (TYLENOL) 325 MG tablet Take 325-650 mg by mouth every 4 (four) hours as needed. pain      . ALPRAZolam (XANAX) 0.25 MG tablet Take 0.25 mg by mouth 3 (three) times daily as needed. anxiety      . amLODipine (NORVASC) 10 MG tablet Take 10 mg by mouth every morning.      [name] aspirin 81 MG EC tablet Take 81 mg by mouth every evening. Swallow whole.      . levothyroxine  (SYNTHROID, LEVOTHROID) 100 MCG tablet take 1 tablet by mouth once daily  90 tablet  0  . losartan (COZAAR) 50 MG tablet Take 25 mg by mouth every morning.      [name] omeprazole (PRILOSEC) 40 MG capsule Take 1 capsule (40 mg total) by mouth 2 (two) times daily.  180 capsule  1  . traMADol (ULTRAM) 50 MG tablet Take 1 tablet (50 mg total) by mouth every 8 (eight) hours as needed for pain.  30 tablet  1   No current facility-administered medications for this visit.     Allergies  Allergen Reactions  . Amoxicillin Hives    Hives  . Codeine Hives    REACTION: "makes her crazy" Hives  . Demerol Hives  . Morphine And Related Hives  . Vicodin (Hydrocodone-Acetaminophen) Hives  . Sulfonamide Derivatives Rash    REACTION: Rash    BP 102/64  Pulse 70  Resp 18  Ht 5\' 4"  (1.626 m)  Wt 145 lb (65.772 kg)  BMI 24.88 kg/m2  *RADIOLOGY REPORT*  Clinical Data: Subsequent treatment strategy for non-Hodgkin  lymphoma.  NUCLEAR MEDICINE PET SKULL BASE TO THIGH  Fasting Blood Glucose: 93  Technique: 18.1 mCi F-18 FDG was injected intravenously. CT data  was obtained and used for attenuation correction and anatomic  localization only. (This was not acquired as a diagnostic CT  examination.) Additional exam technical data entered on  technologist worksheet.  Comparison: 12/09/2011.  Findings:  Neck: No hypermetabolic lymph nodes in the neck. Hypermetabolic  activity in the thyroid is unchanged. CT images show no acute  findings.  Chest: No hypermetabolic mediastinal or hilar lymph nodes. No  worrisome pulmonary nodules. CT images show no pericardial or  pleural effusion.  Abdomen/Pelvis: No abnormal hypermetabolism in the abdomen or  pelvis. CT images show no acute findings.  Skeleton: No focal hypermetabolic activity to suggest skeletal  metastasis.  IMPRESSION:  1. No evidence of recurrent lymphoma.  2. Stable hypermetabolism in the thyroid.  Original Report Authenticated By: Leanna Battles, M.D.    No results found.   Review of Systems  Constitutional: Positive for chills and fatigue. Negative for fever, diaphoresis, activity change, appetite change and unexpected weight change.  HENT: Positive for congestion and sinus pressure. Negative for hearing loss, ear pain, nosebleeds, sore throat, facial swelling, sneezing, drooling, mouth sores, trouble swallowing, neck pain, neck stiffness, dental problem, tinnitus and ear discharge.   Eyes: Negative for photophobia, discharge and visual disturbance.  Respiratory: Positive for cough. Negative for choking,  chest tightness and shortness of breath.   Cardiovascular: Negative for chest pain and palpitations.  Gastrointestinal: Negative for nausea, vomiting, abdominal pain, diarrhea, constipation, blood in stool, anal bleeding and rectal pain.  Genitourinary: Negative for dysuria, frequency and difficulty urinating.  Musculoskeletal: Negative for myalgias and gait problem.  Skin: Negative for color change, pallor and rash.  Neurological: Negative for dizziness, speech difficulty, weakness and numbness.  Hematological: Positive for adenopathy. Does not bruise/bleed easily.  Psychiatric/Behavioral: Positive for dysphoric mood. Negative for behavioral problems, confusion and agitation. The patient is not nervous/anxious.        Objective:   Physical Exam  Constitutional: She is oriented to person, place, and time. She appears well-developed and well-nourished. She is active.  Non-toxic appearance. She does not have a sickly appearance. She does not appear ill. No distress.  HENT:  Head: Normocephalic.  Mouth/Throat: Oropharynx is clear and moist. No oropharyngeal exudate.  Eyes: Conjunctivae and EOM are normal. Pupils are equal, round, and reactive to light. No scleral icterus.  Neck: Normal range of motion. Neck supple. No tracheal deviation present.    Cardiovascular: Normal rate, regular rhythm and intact distal pulses.     Pulmonary/Chest: Effort normal and breath sounds normal. No respiratory distress. She exhibits no tenderness.  Abdominal: Soft. She exhibits no distension and no mass. There is no tenderness. Hernia confirmed negative in the right inguinal area and confirmed negative in the left inguinal area.  Genitourinary: No vaginal discharge found.  Musculoskeletal: Normal range of motion. She exhibits no tenderness.  Lymphadenopathy:       Head (right side): No submental, no submandibular, no tonsillar, no preauricular, no posterior auricular and no occipital adenopathy present.       Head (left side): No submental, no submandibular, no tonsillar, no preauricular, no posterior auricular and no occipital adenopathy present.    She has cervical adenopathy.       Right cervical: Posterior cervical adenopathy present.    She has no axillary adenopathy.       Right: No inguinal adenopathy present.       Left: No inguinal adenopathy present.  Neurological: She is alert and oriented to person, place, and time. No cranial nerve deficit. She exhibits normal muscle tone. Coordination normal.  Skin: Skin is warm and dry. No rash noted. She is not diaphoretic. No erythema.  Psychiatric: She has a normal mood and affect. Her behavior is normal. Judgment and thought content normal. Her affect is not angry, not blunt and not inappropriate. Her speech is not slurred. She is not agitated, not aggressive, not withdrawn and not combative. Thought content is not paranoid. She expresses no homicidal ideation.  Quiet.  Fair recall of names.       Assessment:     Right posterior neck mass most likely consistent with chronic lymph node.  Diagnosis of lymphoma.  No evidence of recurrence by PET scan but that was three months ago.     Plan:     This point, I called her oncologist, Dr. Gwenyth Bouillon.  He re-reviewed everything with me.  He think it is reasonable to perform excisional biopsy of the lymph node mass to make sure there  is no evidence recurrence or these.  Sharon.-This to the patient.  She has achieved in this.  I discussed this with her:  The pathophysiology of skin & subcutaneous masses was discussed.  Natural history risks without surgery were discussed.  I recommended surgery to remove the mass.  I explained  the technique of removal with use of local anesthesia & possible need for more aggressive sedation/anesthesia for patient comfort.    Risks such as bleeding, infection, heart attack, death, and other risks were discussed.  I noted a good likelihood this will help address the problem.   Possibility that this will not correct all symptoms was explained. Possibility of regrowth/recurrence of the mass was discussed.  We will work to minimize complications. Questions were answered.  The patient expresses understanding & wishes to proceed with surgery.  We gave her contact numbers for [facility].  I recommend she call her primary care physician's office and see if there is a physician they can see her.  She has a history of a pacemaker.  I assume a magnet would be put over this.  However, I would feel more comfortable cardiology could offer their opinion on how to manage this.  It looks like she has not seen them in over a year and is overdue anyway.  While this is a simple procedure and hopefully will be very straightforward, I would like their imput since I get the sense that this patient does not consistently follow up with physicians & shops around.

## 2012-10-04 NOTE — Patient Instructions (Addendum)
See a primary care physician to make sure you are sinus and cough issues are under better control.  See cardiology to make sure pacemaker working properly.  We will arrange surgery once cardiology clears you.  Plan outpatient removal of neck mass (probable lymph node)  Swollen Lymph Nodes The lymphatic system filters fluid from around cells. It is like a system of blood vessels. These channels carry lymph instead of blood. The lymphatic system is an important part of the immune (disease fighting) system. When people talk about "swollen glands in the neck," they are usually talking about swollen lymph nodes. The lymph nodes are like the little traps for infection. You and your caregiver may be able to feel lymph nodes, especially swollen nodes, in these common areas: the groin (inguinal area), armpits (axilla), and above the clavicle (supraclavicular). You may also feel them in the neck (cervical) and the back of the head just above the hairline (occipital). Swollen glands occur when there is any condition in which the body responds with an allergic type of reaction. For instance, the glands in the neck can become swollen from insect bites or any type of minor infection on the head. These are very noticeable in children with only minor problems. Lymph nodes may also become swollen when there is a tumor or problem with the lymphatic system, such as Hodgkin's disease. TREATMENT   Most swollen glands do not require treatment. They can be observed (watched) for a short period of time, if your caregiver feels it is necessary. Most of the time, observation is not necessary.  Antibiotics (medicines that kill germs) may be prescribed by your caregiver. Your caregiver may prescribe these if he or she feels the swollen glands are due to a bacterial (germ) infection. Antibiotics are not used if the swollen glands are caused by a virus. HOME CARE INSTRUCTIONS   Take medications as directed by your caregiver. Only  take over-the-counter or prescription medicines for pain, discomfort, or fever as directed by your caregiver. SEEK MEDICAL CARE IF:   If you begin to run a temperature greater than 102 F (38.9 C), or as your caregiver suggests. MAKE SURE YOU:   Understand these instructions.  Will watch your condition.  Will get help right away if you are not doing well or get worse. Document Released: 06/05/2002 Document Revised: 09/07/2011 Document Reviewed: 06/15/2005 [facility] Patient Information 2013 [name], [place].  Non-Hodgkin's Lymphoma, Adult Non-Hodgkin's lymphoma is a cancer that begins in the lymphoid tissue (part of your body's defense system, which protects the body from infections, germs, and diseases). Lymphocytes (a type of white blood cells) are found in the lymphoid tissue. Non-Hodgkin's lymphoma starts in lymphocytes. There are different types of non-Hodgkin's lymphoma. Your caregiver will help you understand the seriousness of your cancer. This will be based on the type of cells affected and how fast it is growing and spreading. CAUSES  Non-Hodgkin's lymphoma is a cancer that starts in lymphocytes. The cause of non-Hodgkin's lymphoma is not known. It is thought that viruses cause certain types of non-Hodgkin's lymphoma. But this is less common. The risk of getting this cancer increases if:   Your immune system (body's defense system) is weak, especially after an organ transplant.  You are an elderly white female.  You have a diet high in fat.  You are infected with certain viruses. SYMPTOMS  Non-Hodgkin's lymphoma can occur at any age. It can cause different symptoms such as:  Swelling of the lymph nodes.  Fever.  Excessive sweating.  Itchy skin.  Tiredness.  Weight loss.  Coughing, breathing trouble, and chest pain.  Weakness and tiredness that do not go away.  Pain, swelling. or a feeling of fullness in the abdomen. DIAGNOSIS  Your caregiver will examine you to  check your general health and look for any lumps. Blood tests and biopsy (removing body tissue for testing) of the lymph node (gland) may be included. Your caregiver may suggest chest X-ray, scanning or lumbar puncture (collecting fluid from spinal column for testing).  TREATMENT  Non-Hodgkin's lymphoma can be treated in different ways. This depends on your symptoms, the stage of your cancer when you were first diagnosed, and the speed with which it is spreading. You and your caregiver will work together and decide on the best plan. Treatment may include:  Radiation therapy (using radiation to destroy the cancer cells).  Chemotherapy (using drugs to destroy the cancer cells).  Biological therapy (using body's immune system to treat cancer) like monoclonal antibody therapy (using antibodies that can kill or block the cancer cells).  Newer types of treatment are vaccine therapy and high-dose chemotherapy with stem cell (a type of cell) transplant (introducing healthy stem cells into the body). However, these are still under development. You may remain symptom-free for 1 to 2 years, after treatment. Non-Hodgkin's lymphoma may recur. If it recurs, your caregiver will advise you on the suitable treatment. If you are pregnant and also have non-Hodgkin's lymphoma, you need immediate treatment. Your caregiver will decide the treatment that suits you the most.  SEEK MEDICAL CARE IF:   You develop new symptoms of non-Hodgkin's lymphoma.  You have non-Hodgkin's lymphoma and continuous fever. Document Released: 12/27/2006 Document Revised: 09/07/2011 Document Reviewed: 12/27/2006 [facility] Patient Information 2013 [place], [place].

## 2012-10-06 ENCOUNTER — Other Ambulatory Visit: Payer: Self-pay | Admitting: Family Medicine

## 2012-10-06 ENCOUNTER — Ambulatory Visit (INDEPENDENT_AMBULATORY_CARE_PROVIDER_SITE_OTHER): Payer: No Typology Code available for payment source | Admitting: Family Medicine

## 2012-10-06 ENCOUNTER — Encounter: Payer: Self-pay | Admitting: Family Medicine

## 2012-10-06 VITALS — BP 132/86 | HR 92 | Temp 98.0°F | Ht 64.0 in | Wt 144.1 lb

## 2012-10-06 DIAGNOSIS — J209 Acute bronchitis, unspecified: Secondary | ICD-10-CM

## 2012-10-06 DIAGNOSIS — C8589 Other specified types of non-Hodgkin lymphoma, extranodal and solid organ sites: Secondary | ICD-10-CM

## 2012-10-06 DIAGNOSIS — C8590 Non-Hodgkin lymphoma, unspecified, unspecified site: Secondary | ICD-10-CM

## 2012-10-06 DIAGNOSIS — I519 Heart disease, unspecified: Secondary | ICD-10-CM

## 2012-10-06 DIAGNOSIS — E785 Hyperlipidemia, unspecified: Secondary | ICD-10-CM

## 2012-10-06 DIAGNOSIS — I10 Essential (primary) hypertension: Secondary | ICD-10-CM

## 2012-10-06 DIAGNOSIS — E041 Nontoxic single thyroid nodule: Secondary | ICD-10-CM

## 2012-10-06 MED ORDER — CIPROFLOXACIN HCL 500 MG PO TABS: 500.0000 mg | ORAL_TABLET | Freq: Two times a day (BID) | ORAL | Status: DC

## 2012-10-06 NOTE — Patient Instructions (Addendum)
Mucinex twice a day Probiotic such as Digestive Advantage Xicam nasal spray daily   Bronchitis Bronchitis is the body's way of reacting to injury and/or infection (inflammation) of the bronchi. Bronchi are the air tubes that extend from the windpipe into the lungs. If the inflammation becomes severe, it may cause shortness of breath. CAUSES  Inflammation may be caused by:  A virus.  Germs (bacteria).  Dust.  Allergens.  Pollutants and many other irritants. The cells lining the bronchial tree are covered with tiny hairs (cilia). These constantly beat upward, away from the lungs, toward the mouth. This keeps the lungs free of pollutants. When these cells become too irritated and are unable to do their job, mucus begins to develop. This causes the characteristic cough of bronchitis. The cough clears the lungs when the cilia are unable to do their job. Without either of these protective mechanisms, the mucus would settle in the lungs. Then you would develop pneumonia. Smoking is a common cause of bronchitis and can contribute to pneumonia. Stopping this habit is the single most important thing you can do to help yourself. TREATMENT   Your caregiver may prescribe an antibiotic if the cough is caused by bacteria. Also, medicines that open up your airways make it easier to breathe. Your caregiver may also recommend or prescribe an expectorant. It will loosen the mucus to be coughed up. Only take over-the-counter or prescription medicines for pain, discomfort, or fever as directed by your caregiver.  Removing whatever causes the problem (smoking, for example) is critical to preventing the problem from getting worse.  Cough suppressants may be prescribed for relief of cough symptoms.  Inhaled medicines may be prescribed to help with symptoms now and to help prevent problems from returning.  For those with recurrent (chronic) bronchitis, there may be a need for steroid medicines. SEEK IMMEDIATE  MEDICAL CARE IF:   During treatment, you develop more pus-like mucus (purulent sputum).  You have a fever.  Your baby is older than 3 months with a rectal temperature of 102 F (38.9 C) or higher.  Your baby is 72 months old or younger with a rectal temperature of 100.4 F (38 C) or higher.  You become progressively more ill.  You have increased difficulty breathing, wheezing, or shortness of breath. It is necessary to seek immediate medical care if you are elderly or sick from any other disease. MAKE SURE YOU:   Understand these instructions.  Will watch your condition.  Will get help right away if you are not doing well or get worse. Document Released: 06/15/2005 Document Revised: 09/07/2011 Document Reviewed: 04/24/2008 [facility] Patient Information 2013 [place], [place].

## 2012-10-07 LAB — CBC
HCT: 39.1 % (ref 36.0–46.0)
Hemoglobin: 12.9 g/dL (ref 12.0–15.0)
MCHC: 33 g/dL (ref 30.0–36.0)
RBC: 4.37 MIL/uL (ref 3.87–5.11)
WBC: 6.8 10*3/uL (ref 4.0–10.5)

## 2012-10-07 LAB — BASIC METABOLIC PANEL
BUN: 11 mg/dL (ref 6–23)
CO2: 28 mEq/L (ref 19–32)

## 2012-10-07 LAB — PHOSPHORUS: Phosphorus: 3.6 mg/dL (ref 2.3–4.6)

## 2012-10-07 LAB — HEPATIC FUNCTION PANEL
ALT: 19 U/L (ref 0–35)
AST: 28 U/L (ref 0–37)
Bilirubin, Direct: 0.1 mg/dL (ref 0.0–0.3)
Indirect Bilirubin: 0.2 mg/dL (ref 0.0–0.9)
Total Bilirubin: 0.3 mg/dL (ref 0.3–1.2)

## 2012-10-07 LAB — LIPID PANEL
HDL: 53 mg/dL (ref >39)
Total CHOL/HDL Ratio: 4.5 Ratio
VLDL: 40 mg/dL (ref 0–40)

## 2012-10-07 LAB — TSH: TSH: 2.238 u[IU]/mL (ref 0.350–4.500)

## 2012-10-07 NOTE — Progress Notes (Signed)
Quick Note:  Patient Informed and voiced understanding ______ 

## 2012-10-09 ENCOUNTER — Encounter: Payer: Self-pay | Admitting: Family Medicine

## 2012-10-09 DIAGNOSIS — J209 Acute bronchitis, unspecified: Secondary | ICD-10-CM | POA: Insufficient documentation

## 2012-10-09 NOTE — Assessment & Plan Note (Signed)
Started on Mucines, Ciptrofloxacin, probiotics and nasal saline.

## 2012-10-09 NOTE — Assessment & Plan Note (Signed)
Well controlled, no changes to meds 

## 2012-10-09 NOTE — Progress Notes (Signed)
Patient ID: [name], female   DOB: 11-12-1934, 78 y.o.   MRN: [id number] VAIL BASISTA [id number] 03-Aug-1934 10/09/2012      Progress Note-Follow Up  Subjective  Chief Complaint  Chief Complaint  Patient presents with  . Follow-up    3 month  . Cough    X 1 week w/phlegm- yellow    HPI  78 year old female who is in today for followup. She's [place]. She's been struggling with intermittent low-grade fevers and chills. She was malaise and myalgias. She has nasal congestion and a tickle in her throat. Most of the time her cough is dry and fitful but at times is productive of yellow phlegm. She's had some shortness of breath with coughing and exertion as well. Has been using some Alka-Seltzer over-the-counter with minimal improvement. His following closely with [facility] for cataracts and ptosis. Is also following closely with oncology and endocrinology. PET scan in January showed good news for her non-Hodgkin's lymphoma she denies any palpitations or chest pain.  Past Medical History  Diagnosis Date  . Hypothyroidism   . Anemia     pt denies  . Osteoporosis   . Pleural effusion, bilateral     history of  . Pulmonary nodules     history of  . History of pancreatitis     pt denies  . Cystocele     history with rectocele, pt denies  . Scoliosis   . Varicose vein   . Non Hodgkin's lymphoma   . Acute bronchitis 10/09/2012    Past Surgical History  Procedure Laterality Date  . Breast surgery  07/30/01    biopsy x 3, bilateral  . Abdominal hysterectomy  04/29/04    partial  . Pacemaker insertion  09/15/11    MDT  . Tonsillectomy      Family History  Problem Relation Age of Onset  . Heart disease Daughter     congenital "whole in her heart"  . Stroke Mother   . Lymphoma Daughter     nonhodgkins  . Stroke Father     History   Social History  . Marital Status: Widowed    Spouse Name: N/A    Number of  Children: 3  . Years of Education: N/A   Occupational History  . self employed    Social History Main Topics  . Smoking status: Never Smoker   . Smokeless tobacco: Never Used  . Alcohol Use: Yes     Comment: 1 glass of wine occas  . Drug Use: No  . Sexually Active: Not on file   Other Topics Concern  . Not on file   Social History Narrative  . No narrative on file    Current Outpatient Prescriptions on File Prior to Visit  Medication Sig Dispense Refill  . acetaminophen (TYLENOL) 325 MG tablet Take 325-650 mg by mouth every 4 (four) hours as needed. pain      . ALPRAZolam (XANAX) 0.25 MG tablet Take 0.25 mg by mouth 3 (three) times daily as needed. anxiety      . amLODipine (NORVASC) 10 MG tablet Take 10 mg by mouth every morning.      [name] aspirin 81 MG EC tablet Take 81 mg by mouth every evening. Swallow whole.      . levothyroxine (SYNTHROID, LEVOTHROID) 100 MCG tablet take 1 tablet by mouth once daily  90 tablet  0  . losartan (COZAAR)  50 MG tablet Take 25 mg by mouth every morning.      [name] omeprazole (PRILOSEC) 40 MG capsule Take 1 capsule (40 mg total) by mouth 2 (two) times daily.  180 capsule  1  . traMADol (ULTRAM) 50 MG tablet Take 1 tablet (50 mg total) by mouth every 8 (eight) hours as needed for pain.  30 tablet  1   No current facility-administered medications on file prior to visit.    Allergies  Allergen Reactions  . Amoxicillin Hives    Hives  . Codeine Hives    REACTION: "makes her crazy" Hives  . Demerol Hives  . Morphine And Related Hives  . Vicodin (Hydrocodone-Acetaminophen) Hives  . Sulfonamide Derivatives Rash    REACTION: Rash    Review of Systems  Review of Systems  Constitutional: Positive for fever and chills. Negative for malaise/fatigue.  HENT: Negative for hearing loss, ear pain, congestion, tinnitus and ear discharge.   Eyes: Negative for discharge.  Respiratory: Positive for cough and sputum production. Negative for shortness of  breath.   Cardiovascular: Negative for chest pain, palpitations and leg swelling.  Gastrointestinal: Negative for nausea, abdominal pain and diarrhea.  Genitourinary: Negative for dysuria.  Musculoskeletal: Negative for falls.  Skin: Negative for rash.  Neurological: Negative for loss of consciousness and headaches.  Endo/Heme/Allergies: Negative for polydipsia.  Psychiatric/Behavioral: Negative for depression and suicidal ideas. The patient is not nervous/anxious and does not have insomnia.     Objective  BP 132/86  Pulse 92  Temp(Src) 98 F (36.7 C) (Oral)  Ht 5\' 4"  (1.626 m)  Wt 144 lb 1.3 oz (65.354 kg)  BMI 24.72 kg/m2  SpO2 95%  Physical Exam  Physical Exam  Constitutional: She is oriented to person, place, and time and well-developed, well-nourished, and in no distress. No distress.  HENT:  Head: Normocephalic and atraumatic.  Eyes: Conjunctivae are normal.  Neck: Neck supple. No thyromegaly present.  Cardiovascular: Normal rate, regular rhythm and normal heart sounds.   No murmur heard. Pulmonary/Chest: Effort normal and breath sounds normal. She has no wheezes.  Abdominal: She exhibits no distension and no mass.  Musculoskeletal: She exhibits no edema.  Lymphadenopathy:    She has no cervical adenopathy.  Neurological: She is alert and oriented to person, place, and time.  Skin: Skin is warm and dry. No rash noted. She is not diaphoretic.  Psychiatric: Memory, affect and judgment normal.    Lab Results  Component Value Date   TSH 2.238 10/06/2012   Lab Results  Component Value Date   WBC 6.8 10/06/2012   HGB 12.9 10/06/2012   HCT 39.1 10/06/2012   MCV 89.5 10/06/2012   PLT 416* 10/06/2012   Lab Results  Component Value Date   CREATININE 0.75 10/06/2012   BUN 11 10/06/2012   NA 141 10/06/2012   K 4.2 10/06/2012   CL 105 10/06/2012   CO2 28 10/06/2012   Lab Results  Component Value Date   ALT 19 10/06/2012   AST 28 10/06/2012   ALKPHOS 77 10/06/2012    BILITOT 0.3 10/06/2012   Lab Results  Component Value Date   CHOL 238* 10/06/2012   Lab Results  Component Value Date   HDL 53 10/06/2012   Lab Results  Component Value Date   LDLCALC 145* 10/06/2012   Lab Results  Component Value Date   TRIG 199* 10/06/2012   Lab Results  Component Value Date   CHOLHDL 4.5 10/06/2012     Assessment &  Plan  Hypertension Well controlled, no changes to meds.  Acute bronchitis Started on Mucines, Ciptrofloxacin, probiotics and nasal saline.  NHL (non-Hodgkin's lymphoma)-recent diagnosis Following closely with oncology and tolerating therapy  DIASTOLIC DYSFUNCTION, on 2D Jan 2011 (grade 1) Has appt with cardiology tomorrow.

## 2012-10-09 NOTE — Assessment & Plan Note (Signed)
Has appt with cardiology tomorrow.

## 2012-10-09 NOTE — Assessment & Plan Note (Signed)
Following closely with oncology and tolerating therapy

## 2012-10-10 ENCOUNTER — Other Ambulatory Visit: Payer: Self-pay | Admitting: Internal Medicine

## 2012-10-10 NOTE — Telephone Encounter (Signed)
Sorry, pt does take Alprazolam but I don't see when it was last refilled? Please advise?

## 2012-10-10 NOTE — Telephone Encounter (Signed)
Please advise refill? I don't see where the patient has taken this?

## 2012-10-10 NOTE — Telephone Encounter (Signed)
OK to send #30 with no refills. 

## 2012-10-11 ENCOUNTER — Ambulatory Visit: Payer: No Typology Code available for payment source | Admitting: Family Medicine

## 2012-10-12 ENCOUNTER — Encounter (INDEPENDENT_AMBULATORY_CARE_PROVIDER_SITE_OTHER): Payer: Self-pay

## 2012-10-12 ENCOUNTER — Telehealth (INDEPENDENT_AMBULATORY_CARE_PROVIDER_SITE_OTHER): Payer: Self-pay

## 2012-10-12 NOTE — Telephone Encounter (Signed)
Called pt to notify her that we did receive clearance. I will take her surgical orders to surgery scheduling. I will get the clearance scanned into epic along with her pacemaker instructions.

## 2012-10-13 ENCOUNTER — Ambulatory Visit: Payer: No Typology Code available for payment source | Admitting: Endocrinology

## 2012-10-18 ENCOUNTER — Ambulatory Visit (INDEPENDENT_AMBULATORY_CARE_PROVIDER_SITE_OTHER): Payer: No Typology Code available for payment source | Admitting: Endocrinology

## 2012-10-18 ENCOUNTER — Encounter: Payer: Self-pay | Admitting: Endocrinology

## 2012-10-18 VITALS — BP 122/78 | HR 80 | Wt 145.0 lb

## 2012-10-18 DIAGNOSIS — E041 Nontoxic single thyroid nodule: Secondary | ICD-10-CM

## 2012-10-18 NOTE — Progress Notes (Signed)
Subjective:    Patient ID: [name], female    DOB: 04/14/1935, 78 y.o.   MRN: [id number]  HPI Pt states approx 10 years of hypothyroidism.  She has been on synthroid since then.  She was also noted to have a multinodular goiter in 2010.  She denies swelling at the anterior neck, or assoc pain.  She has h/o NHL at the right posterior neck, but she did not have XRT there.   Past Medical History  Diagnosis Date  . Hypothyroidism   . Anemia     pt denies  . Osteoporosis   . Pleural effusion, bilateral     history of  . Pulmonary nodules     history of  . History of pancreatitis     pt denies  . Cystocele     history with rectocele, pt denies  . Scoliosis   . Varicose vein   . Non Hodgkin's lymphoma   . Acute bronchitis 10/09/2012    Past Surgical History  Procedure Laterality Date  . Breast surgery  07/30/01    biopsy x 3, bilateral  . Abdominal hysterectomy  04/29/04    partial  . Pacemaker insertion  09/15/11    MDT  . Tonsillectomy      History   Social History  . Marital Status: Widowed    Spouse Name: N/A    Number of Children: 3  . Years of Education: N/A   Occupational History  . self employed    Social History Main Topics  . Smoking status: Never Smoker   . Smokeless tobacco: Never Used  . Alcohol Use: Yes     Comment: 1 glass of wine occas  . Drug Use: No  . Sexually Active: Not on file   Other Topics Concern  . Not on file   Social History Narrative  . No narrative on file    Current Outpatient Prescriptions on File Prior to Visit  Medication Sig Dispense Refill  . amLODipine (NORVASC) 10 MG tablet Take 10 mg by mouth every morning.      [name] losartan (COZAAR) 50 MG tablet Take 50 mg by mouth every morning.       [name] omeprazole (PRILOSEC) 40 MG capsule Take 1 capsule (40 mg total) by mouth 2 (two) times daily.  180 capsule  1  . ALPRAZolam (XANAX) 0.25 MG tablet Take 0.25 mg by mouth daily as needed for anxiety.      . hydroxypropyl  methylcellulose (ISOPTO TEARS) 2.5 % ophthalmic solution Place 1 drop into both eyes 4 (four) times daily as needed (dry eyes).      [name] KRILL OIL OMEGA-3 PO Take 1 capsule by mouth daily.      [name] levothyroxine (SYNTHROID, LEVOTHROID) 100 MCG tablet Take 100 mcg by mouth daily before breakfast.       No current facility-administered medications on file prior to visit.    Allergies  Allergen Reactions  . Amoxicillin Hives    Hives  . Codeine Hives    REACTION: "makes her crazy" Hives  . Demerol Hives  . Morphine And Related Hives  . Vicodin (Hydrocodone-Acetaminophen) Hives  . Sulfonamide Derivatives Rash    REACTION: Rash    Family History  Problem Relation Age of Onset  . Heart disease Daughter     congenital "whole in her heart"  . Stroke Mother   . Lymphoma Daughter     nonhodgkins  . Stroke Father     BP 122/78  Pulse  80  Wt 145 lb (65.772 kg)  BMI 24.88 kg/m2  SpO2 98%    Review of Systems denies depression, hair loss, weight change, memory loss, constipation, numbness, blurry vision, dry skin, rhinorrhea, and syncope.  She has cramps in her feet at night, easy bruising, and doe.    Objective:   Physical Exam VS: see vs page GEN: no distress HEAD: head: no deformity eyes: no periorbital swelling, no proptosis. external nose and ears are normal. mouth: no lesion seen. NECK: thyroid is slightly enlarged, with irregular surface.  i am unable to palpate a nodule.  CHEST WALL: no deformity. LUNGS:  Clear to auscultation. CV: reg rate and rhythm, no murmur. ABD: abdomen is soft, nontender.  no hepatosplenomegaly.  not distended.  no hernia. MUSCULOSKELETAL: muscle bulk and strength are grossly normal.  no obvious joint swelling.  gait is normal and steady.   EXTEMITIES: no deformity.  no edema.   PULSES: dorsalis pedis intact bilat.  no carotid bruit. NEURO:  cn 2-12 grossly intact.   readily moves all 4's.  sensation is intact to touch on all 4's SKIN:  Normal  texture and temperature.  No rash or suspicious lesion is visible.  Not diaphoretic.    NODES:  1 cm palpable at the right posterior neck. PSYCH: alert, oriented x3.  Does not appear anxious nor depressed.  Lab Results  Component Value Date   TSH 2.238 10/06/2012   bx of a right thyroid nodule (2013): FINDINGS CONSISTENT WITH NON-NEOPLASTIC GOITER.    Assessment & Plan:  NHL: this sometimes is found in the thyroid, but none was seen on bx.  She did not have XRT Hypothyroidism: well-replaced.  Chronic thyroiditis can cause nodules, but this was also not seen on bx. small multinodular goiter.  This could cause hyperthyroid over a period of years.  Therefore, she could develop hyperthyroidism, or worsening hypothyroidism over time.

## 2012-10-18 NOTE — Patient Instructions (Addendum)
Please continue the same medication Please return in 1-2 years. With time, your need for the thyroid pill could go up or down.

## 2012-10-19 ENCOUNTER — Encounter (HOSPITAL_COMMUNITY): Payer: Self-pay

## 2012-10-25 ENCOUNTER — Encounter (HOSPITAL_COMMUNITY)
Admission: RE | Admit: 2012-10-25 | Discharge: 2012-10-25 | Disposition: A | Discharge status: Home / Self Care | Payer: No Typology Code available for payment source | Source: Ambulatory Visit | Attending: Surgery | Admitting: Surgery

## 2012-10-25 ENCOUNTER — Encounter (HOSPITAL_COMMUNITY): Payer: Self-pay

## 2012-10-25 HISTORY — DX: Personal history of other diseases of the digestive system: Z87.19

## 2012-10-25 HISTORY — DX: Unspecified osteoarthritis, unspecified site: M19.90

## 2012-10-25 HISTORY — DX: Unspecified cataract: H26.9

## 2012-10-25 HISTORY — DX: Gastro-esophageal reflux disease without esophagitis: K21.9

## 2012-10-25 HISTORY — DX: Unspecified hemorrhoids: K64.9

## 2012-10-25 HISTORY — DX: Cardiac arrhythmia, unspecified: I49.9

## 2012-10-25 HISTORY — DX: Presence of cardiac pacemaker: Z95.0

## 2012-10-25 LAB — CBC
HCT: 36.9 % (ref 36.0–46.0)
MCH: 28.8 pg (ref 26.0–34.0)
MCHC: 33.6 g/dL (ref 30.0–36.0)
MCV: 85.8 fL (ref 78.0–100.0)
RDW: 15.2 % (ref 11.5–15.5)

## 2012-10-25 LAB — BASIC METABOLIC PANEL
BUN: 17 mg/dL (ref 6–23)
CO2: 27 mEq/L (ref 19–32)
Chloride: 105 mEq/L (ref 96–112)
Creatinine, Ser: 0.84 mg/dL (ref 0.50–1.10)
Glucose, Bld: 88 mg/dL (ref 70–99)

## 2012-10-25 NOTE — Pre-Procedure Instructions (Signed)
[name]  10/25/2012   Your procedure is scheduled on:  Friday, May 2nd.  Report to [facility] at 5:30 AM.  Call this number if you have problems the morning of surgery: [phone]   Remember:   Do not eat food or drink liquids after midnight.   Take these medicines the morning of surgery with A SIP OF WATER: amLODipine (NORVASC), levothyroxine (SYNTHROID, LEVOTHROID),omeprazole (PRILOSEC)  Stop taking Aspirin, Coumadin, Plavix, Effient and Herbal medications (Krill Oil).  Do not take any NSAIDs ie: Ibuprofen,  Advil,Naproxen or any medication containing Aspirin.    Do not wear jewelry, make-up or nail polish.  Do not wear lotions, powders, or perfumes. You may wear deodorant.  Do not shave 48 hours prior to surgery.  Do not bring valuables to the hospital.  Contacts, dentures or bridgework may not be worn into surgery.  Leave suitcase in the car. After surgery it may be brought to your room.  For patients admitted to the hospital, checkout time is 11:00 AM the day of discharge.   Patients discharged the day of surgery will not be allowed to drive home.  Name and phone number of your driver: -  Special Instructions: Shower using CHG 2 nights before surgery and the night before surgery.  If you shower the day of surgery use CHG.  Use special wash - you have one bottle of CHG for all showers.  You should use approximately 1/3 of the bottle for each shower.   Please read over the following fact sheets that you were given: Pain Booklet, Coughing and Deep Breathing and Surgical Site Infection Prevention

## 2012-10-27 MED ORDER — CLINDAMYCIN PHOSPHATE 600 MG/50ML IV SOLN
600.0000 mg | INTRAVENOUS | Status: AC
  Administered 2012-10-28: 600 mg via INTRAVENOUS
  Filled 2012-10-27: qty 50

## 2012-10-28 ENCOUNTER — Ambulatory Visit (HOSPITAL_COMMUNITY)
Admission: RE | Admit: 2012-10-28 | Discharge: 2012-10-28 | Disposition: A | Discharge status: Home / Self Care | Payer: No Typology Code available for payment source | Source: Ambulatory Visit | Attending: Surgery | Admitting: Surgery

## 2012-10-28 ENCOUNTER — Encounter (HOSPITAL_COMMUNITY)
Admission: RE | Disposition: A | Discharge status: Home / Self Care | Payer: Self-pay | Source: Ambulatory Visit | Attending: Surgery

## 2012-10-28 ENCOUNTER — Encounter (HOSPITAL_COMMUNITY): Payer: Self-pay | Admitting: Anesthesiology

## 2012-10-28 ENCOUNTER — Ambulatory Visit (HOSPITAL_BASED_OUTPATIENT_CLINIC_OR_DEPARTMENT_OTHER): Admit: 2012-10-28 | Payer: Self-pay | Admitting: Surgery

## 2012-10-28 ENCOUNTER — Encounter (HOSPITAL_COMMUNITY): Payer: Self-pay | Admitting: Surgery

## 2012-10-28 ENCOUNTER — Encounter (HOSPITAL_BASED_OUTPATIENT_CLINIC_OR_DEPARTMENT_OTHER): Payer: Self-pay

## 2012-10-28 ENCOUNTER — Ambulatory Visit (HOSPITAL_COMMUNITY): Payer: No Typology Code available for payment source | Admitting: Anesthesiology

## 2012-10-28 DIAGNOSIS — M810 Age-related osteoporosis without current pathological fracture: Secondary | ICD-10-CM | POA: Insufficient documentation

## 2012-10-28 DIAGNOSIS — C8291 Follicular lymphoma, unspecified, lymph nodes of head, face, and neck: Secondary | ICD-10-CM

## 2012-10-28 DIAGNOSIS — R590 Localized enlarged lymph nodes: Secondary | ICD-10-CM | POA: Diagnosis present

## 2012-10-28 DIAGNOSIS — K449 Diaphragmatic hernia without obstruction or gangrene: Secondary | ICD-10-CM | POA: Insufficient documentation

## 2012-10-28 DIAGNOSIS — K219 Gastro-esophageal reflux disease without esophagitis: Secondary | ICD-10-CM | POA: Insufficient documentation

## 2012-10-28 DIAGNOSIS — E039 Hypothyroidism, unspecified: Secondary | ICD-10-CM | POA: Insufficient documentation

## 2012-10-28 DIAGNOSIS — E785 Hyperlipidemia, unspecified: Secondary | ICD-10-CM | POA: Insufficient documentation

## 2012-10-28 DIAGNOSIS — I10 Essential (primary) hypertension: Secondary | ICD-10-CM | POA: Insufficient documentation

## 2012-10-28 DIAGNOSIS — Z950 Presence of cardiac pacemaker: Secondary | ICD-10-CM | POA: Insufficient documentation

## 2012-10-28 HISTORY — PX: MASS EXCISION: SHX2000

## 2012-10-28 SURGERY — EXCISION MASS
Anesthesia: Monitor Anesthesia Care | Site: Neck | Laterality: Right | Wound class: Clean

## 2012-10-28 SURGERY — EXCISION MASS
Anesthesia: General | Site: Neck

## 2012-10-28 MED ORDER — MIDAZOLAM HCL 5 MG/5ML IJ SOLN
INTRAMUSCULAR | Status: DC | PRN
  Administered 2012-10-28 (×2): 1 mg via INTRAVENOUS

## 2012-10-28 MED ORDER — BUPIVACAINE-EPINEPHRINE PF 0.25-1:200000 % IJ SOLN
INTRAMUSCULAR | Status: AC
  Filled 2012-10-28: qty 30

## 2012-10-28 MED ORDER — TRAMADOL HCL 50 MG PO TABS: 50.0000 mg | ORAL_TABLET | Freq: Four times a day (QID) | ORAL | Status: DC | PRN

## 2012-10-28 MED ORDER — PROPOFOL 10 MG/ML IV BOLUS
INTRAVENOUS | Status: DC | PRN
  Administered 2012-10-28: 30 mg via INTRAVENOUS

## 2012-10-28 MED ORDER — ACETAMINOPHEN 10 MG/ML IV SOLN: 1000.0000 mg | Freq: Once | INTRAVENOUS | Status: DC | PRN

## 2012-10-28 MED ORDER — LIDOCAINE HCL (CARDIAC) 20 MG/ML IV SOLN
INTRAVENOUS | Status: DC | PRN
  Administered 2012-10-28: 5 mg via INTRAVENOUS

## 2012-10-28 MED ORDER — BUPIVACAINE-EPINEPHRINE 0.25% -1:200000 IJ SOLN
INTRAMUSCULAR | Status: DC | PRN
  Administered 2012-10-28: 11.5 mL

## 2012-10-28 MED ORDER — CHLORHEXIDINE GLUCONATE 4 % EX LIQD: 1.0000 "application " | Freq: Once | CUTANEOUS | Status: DC

## 2012-10-28 MED ORDER — FENTANYL CITRATE 0.05 MG/ML IJ SOLN
INTRAMUSCULAR | Status: DC | PRN
  Administered 2012-10-28: 50 ug via INTRAVENOUS

## 2012-10-28 MED ORDER — LIDOCAINE HCL (PF) 1 % IJ SOLN
INTRAMUSCULAR | Status: AC
  Filled 2012-10-28: qty 30

## 2012-10-28 MED ORDER — ONDANSETRON HCL 4 MG/2ML IJ SOLN: 4.0000 mg | Freq: Once | INTRAMUSCULAR | Status: DC | PRN

## 2012-10-28 MED ORDER — ONDANSETRON HCL 4 MG/2ML IJ SOLN
INTRAMUSCULAR | Status: DC | PRN
  Administered 2012-10-28: 4 mg via INTRAVENOUS

## 2012-10-28 MED ORDER — PHENYLEPHRINE HCL 10 MG/ML IJ SOLN
INTRAMUSCULAR | Status: DC | PRN
  Administered 2012-10-28: 80 ug via INTRAVENOUS
  Administered 2012-10-28: 40 ug via INTRAVENOUS
  Administered 2012-10-28: 80 ug via INTRAVENOUS

## 2012-10-28 MED ORDER — 0.9 % SODIUM CHLORIDE (POUR BTL) OPTIME
TOPICAL | Status: DC | PRN
  Administered 2012-10-28: 1000 mL

## 2012-10-28 MED ORDER — FENTANYL CITRATE 0.05 MG/ML IJ SOLN: 25.0000 ug | INTRAMUSCULAR | Status: DC | PRN

## 2012-10-28 MED ORDER — LIDOCAINE HCL 1 % IJ SOLN
INTRAMUSCULAR | Status: DC | PRN
  Administered 2012-10-28: 11.5 mL

## 2012-10-28 MED ORDER — LACTATED RINGERS IV SOLN
INTRAVENOUS | Status: DC | PRN
  Administered 2012-10-28: 07:00:00 via INTRAVENOUS

## 2012-10-28 SURGICAL SUPPLY — 47 items
BENZOIN TINCTURE PRP APPL 2/3 (GAUZE/BANDAGES/DRESSINGS) ×2 IMPLANT
BLADE SURG 10 STRL SS (BLADE) ×2 IMPLANT
BLADE SURG 15 STRL LF DISP TIS (BLADE) ×1 IMPLANT
BLADE SURG 15 STRL SS (BLADE) ×1
CANISTER SUCTION 2500CC (MISCELLANEOUS) IMPLANT
CLOTH BEACON ORANGE TIMEOUT ST (SAFETY) ×2 IMPLANT
CONT SPEC 4OZ CLIKSEAL STRL BL (MISCELLANEOUS) ×2 IMPLANT
COVER SURGICAL LIGHT HANDLE (MISCELLANEOUS) ×2 IMPLANT
DECANTER SPIKE VIAL GLASS SM (MISCELLANEOUS) IMPLANT
DRAPE LAPAROSCOPIC ABDOMINAL (DRAPES) IMPLANT
DRAPE PED LAPAROTOMY (DRAPES) ×2 IMPLANT
ELECT CAUTERY BLADE 6.4 (BLADE) ×2 IMPLANT
ELECT REM PT RETURN 9FT ADLT (ELECTROSURGICAL) ×2
ELECTRODE REM PT RTRN 9FT ADLT (ELECTROSURGICAL) ×1 IMPLANT
GAUZE SPONGE 2X2 8PLY STRL LF (GAUZE/BANDAGES/DRESSINGS) ×2 IMPLANT
GLOVE BIO SURGEON STRL SZ7.5 (GLOVE) ×2 IMPLANT
GLOVE BIOGEL PI IND STRL 7.5 (GLOVE) ×2 IMPLANT
GLOVE BIOGEL PI IND STRL 8 (GLOVE) ×1 IMPLANT
GLOVE BIOGEL PI INDICATOR 7.5 (GLOVE) ×2
GLOVE BIOGEL PI INDICATOR 8 (GLOVE) ×1
GLOVE ECLIPSE 8.0 STRL XLNG CF (GLOVE) ×2 IMPLANT
GLOVE SURG SS PI 6.5 STRL IVOR (GLOVE) ×2 IMPLANT
GOWN PREVENTION PLUS XLARGE (GOWN DISPOSABLE) ×4 IMPLANT
GOWN STRL NON-REIN LRG LVL3 (GOWN DISPOSABLE) ×2 IMPLANT
KIT BASIN OR (CUSTOM PROCEDURE TRAY) ×2 IMPLANT
KIT ROOM TURNOVER OR (KITS) ×2 IMPLANT
NEEDLE HYPO 22GX1.5 SAFETY (NEEDLE) ×2 IMPLANT
NEEDLE HYPO 25X1 1.5 SAFETY (NEEDLE) IMPLANT
NS IRRIG 1000ML POUR BTL (IV SOLUTION) ×2 IMPLANT
PACK SURGICAL SETUP 50X90 (CUSTOM PROCEDURE TRAY) ×2 IMPLANT
PAD ARMBOARD 7.5X6 YLW CONV (MISCELLANEOUS) ×4 IMPLANT
PENCIL BUTTON HOLSTER BLD 10FT (ELECTRODE) ×2 IMPLANT
SPECIMEN JAR SMALL (MISCELLANEOUS) ×2 IMPLANT
SPONGE GAUZE 2X2 STER 10/PKG (GAUZE/BANDAGES/DRESSINGS) ×2
SPONGE GAUZE 4X4 12PLY (GAUZE/BANDAGES/DRESSINGS) IMPLANT
SPONGE LAP 18X18 X RAY DECT (DISPOSABLE) ×2 IMPLANT
STRIP CLOSURE SKIN 1/2X4 (GAUZE/BANDAGES/DRESSINGS) ×2 IMPLANT
SUT MNCRL AB 4-0 PS2 18 (SUTURE) ×2 IMPLANT
SUT VIC AB 3-0 SH 27 (SUTURE) ×1
SUT VIC AB 3-0 SH 27XBRD (SUTURE) ×1 IMPLANT
SYR BULB 3OZ (MISCELLANEOUS) ×2 IMPLANT
SYR CONTROL 10ML LL (SYRINGE) ×2 IMPLANT
TOWEL OR 17X24 6PK STRL BLUE (TOWEL DISPOSABLE) IMPLANT
TOWEL OR 17X26 10 PK STRL BLUE (TOWEL DISPOSABLE) ×2 IMPLANT
TOWEL OR NON WOVEN STRL DISP B (DISPOSABLE) ×2 IMPLANT
TUBE CONNECTING 12X1/4 (SUCTIONS) ×4 IMPLANT
YANKAUER SUCT BULB TIP NO VENT (SUCTIONS) ×2 IMPLANT

## 2012-10-28 NOTE — Anesthesia Postprocedure Evaluation (Signed)
  Anesthesia Post-op Note  Patient: [name]  Procedure(s) Performed: Procedure(s): Removal of mass on neck        (Right)  Patient Location: PACU  Anesthesia Type:MAC  Level of Consciousness: awake, alert  and oriented  Airway and Oxygen Therapy: Patient Spontanous Breathing  Post-op Pain: mild  Post-op Assessment: Post-op Vital signs reviewed, Patient's Cardiovascular Status Stable, Respiratory Function Stable, Patent Airway and Pain level controlled  Post-op Vital Signs: stable  Complications: No apparent anesthesia complications

## 2012-10-28 NOTE — H&P (View-Only) (Signed)
Subjective:     Patient ID: [name], female   DOB: 1934/12/13, 78 y.o.   MRN: [id number]  HPI  [name]  12-Oct-1934 [id number]  Patient Care Team: Edwyna Perfect, MD as PCP - General (Internal Medicine) Storm Frisk, MD as Attending Physician (Pulmonary Disease) Izell Collins, MD as Consulting Physician (Endocrinology)  This patient is a 78 y.o.female who presents today for surgical evaluation at the request of Dr. Donnamae Jude, [facility].   Reason for visit: Persistent neck lymph node.  History of lymphoma.  Elderly woman with numerous health issues.  Lives in [place].  Tends to get care in [place] but has seen many MDs at different locations.  Saw her ophthalmologist who mentioned a lymph node on her neck.  She wished to proceed by a Development worker, international aid.  History of lymphoma.  She claims to have been seen at [facility].  Also evaluated in [place] [facility].  Initially thought to be a mantle cell lymphoma.  Reevaluation more consistent with a follicular lymphoma.  Treated with chemotherapy.  Est. with a new oncologist, Dr. Gwenyth Bouillon, in January 2014.  PET scan at that time showed no concerning active lymphadenopathy.  Therefore consistent with remission of lymphoma.  However patient's sternum with neck mass.  Creams to have some sinusitis and a cough.  Claims that she is not able to see her primary care physician anymore & wondered if I can do anything.  Cannot recall her physicians very well: "Isn't in my record?"  Did not fill out any of her history paperwork.  Patient Active Problem List  Diagnosis  . THYROID NODULE, RIGHT  . HYPOTHYROIDISM  . ADJUSTMENT DISORDER WITH DEPRESSED MOOD  . DIASTOLIC DYSFUNCTION, on 2D Jan 2011 (grade 1)  . GERD  . BACK PAIN  . OSTEOPOROSIS  . HOARSENESS  . DYSPNEA ON EXERTION  . PAP SMEAR, ABNORMAL  . Hypertension  . Sinus bradycardia, symptomatic, s/p MDT PTVDP 09/15/11  . NHL (non-Hodgkin's  lymphoma)-recent diagnosis  . Anxiety  . Dyslipidemia, LDL 136  . Hiatal hernia, large by CXR  . Scoliosis  . Pericardial effusion after pacemaker, hospitalized at [facility] 09/25/11  . Lymphadenopathy, cervical    Past Medical History  Diagnosis Date  . Hypothyroidism   . Anemia     pt denies  . Osteoporosis   . Pleural effusion, bilateral     history of  . Pulmonary nodules     history of  . History of pancreatitis     pt denies  . Cystocele     history with rectocele, pt denies  . Scoliosis   . Varicose vein   . Non Hodgkin's lymphoma     Past Surgical History  Procedure Laterality Date  . Breast surgery  07/30/01    biopsy x 3, bilateral  . Abdominal hysterectomy  04/29/04    partial  . Pacemaker insertion  09/15/11    MDT  . Tonsillectomy      History   Social History  . Marital Status: Widowed    Spouse Name: N/A    Number of Children: 3  . Years of Education: N/A   Occupational History  . self employed    Social History Main Topics  . Smoking status: Never Smoker   . Smokeless tobacco: Never Used  . Alcohol Use: Yes     Comment: 1 glass of wine occas  . Drug Use: No  . Sexually Active: Not  on file   Other Topics Concern  . Not on file   Social History Narrative  . No narrative on file    Family History  Problem Relation Age of Onset  . Heart disease Daughter     congenital "whole in her heart"  . Stroke Mother   . Lymphoma Daughter     nonhodgkins  . Stroke Father     Current Outpatient Prescriptions  Medication Sig Dispense Refill  . acetaminophen (TYLENOL) 325 MG tablet Take 325-650 mg by mouth every 4 (four) hours as needed. pain      . ALPRAZolam (XANAX) 0.25 MG tablet Take 0.25 mg by mouth 3 (three) times daily as needed. anxiety      . amLODipine (NORVASC) 10 MG tablet Take 10 mg by mouth every morning.      [name] aspirin 81 MG EC tablet Take 81 mg by mouth every evening. Swallow whole.      . levothyroxine (SYNTHROID,  LEVOTHROID) 100 MCG tablet take 1 tablet by mouth once daily  90 tablet  0  . losartan (COZAAR) 50 MG tablet Take 25 mg by mouth every morning.      [name] omeprazole (PRILOSEC) 40 MG capsule Take 1 capsule (40 mg total) by mouth 2 (two) times daily.  180 capsule  1  . traMADol (ULTRAM) 50 MG tablet Take 1 tablet (50 mg total) by mouth every 8 (eight) hours as needed for pain.  30 tablet  1   No current facility-administered medications for this visit.     Allergies  Allergen Reactions  . Amoxicillin Hives    Hives  . Codeine Hives    REACTION: "makes her crazy" Hives  . Demerol Hives  . Morphine And Related Hives  . Vicodin (Hydrocodone-Acetaminophen) Hives  . Sulfonamide Derivatives Rash    REACTION: Rash    BP 102/64  Pulse 70  Resp 18  Ht 5\' 4"  (1.626 m)  Wt 145 lb (65.772 kg)  BMI 24.88 kg/m2  *RADIOLOGY REPORT*  Clinical Data: Subsequent treatment strategy for non-Hodgkin  lymphoma.  NUCLEAR MEDICINE PET SKULL BASE TO THIGH  Fasting Blood Glucose: 93  Technique: 18.1 mCi F-18 FDG was injected intravenously. CT data  was obtained and used for attenuation correction and anatomic  localization only. (This was not acquired as a diagnostic CT  examination.) Additional exam technical data entered on  technologist worksheet.  Comparison: 12/09/2011.  Findings:  Neck: No hypermetabolic lymph nodes in the neck. Hypermetabolic  activity in the thyroid is unchanged. CT images show no acute  findings.  Chest: No hypermetabolic mediastinal or hilar lymph nodes. No  worrisome pulmonary nodules. CT images show no pericardial or  pleural effusion.  Abdomen/Pelvis: No abnormal hypermetabolism in the abdomen or  pelvis. CT images show no acute findings.  Skeleton: No focal hypermetabolic activity to suggest skeletal  metastasis.  IMPRESSION:  1. No evidence of recurrent lymphoma.  2. Stable hypermetabolism in the thyroid.  Original Report Authenticated By: Leanna Battles, M.D.     No results found.   Review of Systems  Constitutional: Positive for chills and fatigue. Negative for fever, diaphoresis, activity change, appetite change and unexpected weight change.  HENT: Positive for congestion and sinus pressure. Negative for hearing loss, ear pain, nosebleeds, sore throat, facial swelling, sneezing, drooling, mouth sores, trouble swallowing, neck pain, neck stiffness, dental problem, tinnitus and ear discharge.   Eyes: Negative for photophobia, discharge and visual disturbance.  Respiratory: Positive for cough. Negative for choking, chest  tightness and shortness of breath.   Cardiovascular: Negative for chest pain and palpitations.  Gastrointestinal: Negative for nausea, vomiting, abdominal pain, diarrhea, constipation, blood in stool, anal bleeding and rectal pain.  Genitourinary: Negative for dysuria, frequency and difficulty urinating.  Musculoskeletal: Negative for myalgias and gait problem.  Skin: Negative for color change, pallor and rash.  Neurological: Negative for dizziness, speech difficulty, weakness and numbness.  Hematological: Positive for adenopathy. Does not bruise/bleed easily.  Psychiatric/Behavioral: Positive for dysphoric mood. Negative for behavioral problems, confusion and agitation. The patient is not nervous/anxious.        Objective:   Physical Exam  Constitutional: She is oriented to person, place, and time. She appears well-developed and well-nourished. She is active.  Non-toxic appearance. She does not have a sickly appearance. She does not appear ill. No distress.  HENT:  Head: Normocephalic.  Mouth/Throat: Oropharynx is clear and moist. No oropharyngeal exudate.  Eyes: Conjunctivae and EOM are normal. Pupils are equal, round, and reactive to light. No scleral icterus.  Neck: Normal range of motion. Neck supple. No tracheal deviation present.    Cardiovascular: Normal rate, regular rhythm and intact distal pulses.    Pulmonary/Chest: Effort normal and breath sounds normal. No respiratory distress. She exhibits no tenderness.  Abdominal: Soft. She exhibits no distension and no mass. There is no tenderness. Hernia confirmed negative in the right inguinal area and confirmed negative in the left inguinal area.  Genitourinary: No vaginal discharge found.  Musculoskeletal: Normal range of motion. She exhibits no tenderness.  Lymphadenopathy:       Head (right side): No submental, no submandibular, no tonsillar, no preauricular, no posterior auricular and no occipital adenopathy present.       Head (left side): No submental, no submandibular, no tonsillar, no preauricular, no posterior auricular and no occipital adenopathy present.    She has cervical adenopathy.       Right cervical: Posterior cervical adenopathy present.    She has no axillary adenopathy.       Right: No inguinal adenopathy present.       Left: No inguinal adenopathy present.  Neurological: She is alert and oriented to person, place, and time. No cranial nerve deficit. She exhibits normal muscle tone. Coordination normal.  Skin: Skin is warm and dry. No rash noted. She is not diaphoretic. No erythema.  Psychiatric: She has a normal mood and affect. Her behavior is normal. Judgment and thought content normal. Her affect is not angry, not blunt and not inappropriate. Her speech is not slurred. She is not agitated, not aggressive, not withdrawn and not combative. Thought content is not paranoid. She expresses no homicidal ideation.  Quiet.  Fair recall of names.       Assessment:     Right posterior neck mass most likely consistent with chronic lymph node.  Diagnosis of lymphoma.  No evidence of recurrence by PET scan but that was three months ago.     Plan:     This point, I called her oncologist, Dr. Gwenyth Bouillon.  He re-reviewed everything with me.  He think it is reasonable to perform excisional biopsy of the lymph node mass to make sure there  is no evidence recurrence or these.  Sharon.-This to the patient.  She has achieved in this.  I discussed this with her:  The pathophysiology of skin & subcutaneous masses was discussed.  Natural history risks without surgery were discussed.  I recommended surgery to remove the mass.  I explained the technique  of removal with use of local anesthesia & possible need for more aggressive sedation/anesthesia for patient comfort.    Risks such as bleeding, infection, heart attack, death, and other risks were discussed.  I noted a good likelihood this will help address the problem.   Possibility that this will not correct all symptoms was explained. Possibility of regrowth/recurrence of the mass was discussed.  We will work to minimize complications. Questions were answered.  The patient expresses understanding & wishes to proceed with surgery.  We gave her contact numbers for [place] primary care.  I recommend she call her primary care physician's office and see if there is a physician they can see her.  She has a history of a pacemaker.  I assume a magnet would be put over this.  However, I would feel more comfortable cardiology could offer their opinion on how to manage this.  It looks like she has not seen them in over a year and is overdue anyway.  While this is a simple procedure and hopefully will be very straightforward, I would like their imput since I get the sense that this patient does not consistently follow up with physicians & shops around.

## 2012-10-28 NOTE — Preoperative (Signed)
Beta Blockers   Reason not to administer Beta Blockers:Not Applicable 

## 2012-10-28 NOTE — Progress Notes (Signed)
Partial plate returned to pt per request 

## 2012-10-28 NOTE — Op Note (Signed)
10/28/2012  8:19 AM  PATIENT:  [name]  78 y.o. female  Patient Care Team: Edwyna Perfect, MD as PCP - General (Internal Medicine) Storm Frisk, MD as Attending Physician (Pulmonary Disease) Izell [place], MD as Consulting Physician (Endocrinology) Si Gaul, MD as Consulting Physician (Medical Oncology) Thurmon Fair, MD as Consulting Physician (Cardiology)  PRE-OPERATIVE DIAGNOSIS:  Persistent neck mass, probable lymph node.  History of lymphoma  POST-OPERATIVE DIAGNOSIS:  Persistent neck mass, probable lymph node.  History of lymphoma  PROCEDURE:  Procedure(s): Removal of mass on neck         SURGEON:  Surgeon(s): Ardeth Sportsman, MD  ASSISTANT: none   ANESTHESIA:   local and IV sedation  EBL:  Total I/O In: 250 [I.V.:250] Out: -   Delay start of Pharmacological VTE agent (>24hrs) due to surgical blood loss or risk of bleeding:  no  DRAINS: none   SPECIMEN:  Source of Specimen:  Posterior neck mass (probable lymph node)  DISPOSITION OF SPECIMEN:  PATHOLOGY  COUNTS:  YES  PLAN OF CARE: Discharge to home after PACU  PATIENT DISPOSITION:  PACU - hemodynamically stable.  INDICATION: Elderly woman survivor of lymphoma.  Has had persistent right posterior neck mass.  Wishes excision and biopsy.  Discussed with her medical oncologist.  He thought it was reasonable.  Technique of removal was discussed.  Risks benefits alternatives discussed.  She agreed to proceed.  OR FINDINGS: Upper right posterior cervical neck mass consistent with mildly enlarged lymphoma.  No other obvious lymphadenopathy in the region.  DESCRIPTION: Informed consent was confirmed.  Patient was brought in the operating room and carefully positioned in the left side down decubitus positioning.  Her neck and upper chest were prepped and draped in a sterile fashion.  Surgical timeout confirmed our plan.  After confirming adequate sedation, I placed a field block using local  anesthetic.  I made a mildly oblique incision within a natural skin crease along the right posterior lateral neck x 1.5 cm.  I explored the subcutaneous tissues using careful blunt and focused sharp dissection and was able to isolate the mobile ellipsoid mass.  Had to get down to the neck musculature.  Carefully freed around that mobilized firm mass consistent with a lymph node.  I assured hemostasis and removed it intact.  Careful exploration confirmed no other lymphadenopathy in the region.  I assured hemostasis.  I closed the wound in interrupted layers of 4 Monocryl.  Steri-Strip and sterile dressing were applied.  Patient tolerated procedure well.  She is going to recovery room.

## 2012-10-28 NOTE — Anesthesia Procedure Notes (Addendum)
Procedure Name: MAC Date/Time: 10/28/2012 7:34 AM Performed by: Fransisca Kaufmann Pre-anesthesia Checklist: Patient identified, Emergency Drugs available, Suction available, Patient being monitored and Timeout performed Patient Re-evaluated:Patient Re-evaluated prior to inductionOxygen Delivery Method: Nasal cannula Intubation Type: IV induction Placement Confirmation: positive ETCO2

## 2012-10-28 NOTE — Interval H&P Note (Signed)
History and Physical Interval Note:  10/28/2012 7:12 AM  [name]  has presented today for surgery, with the diagnosis of Persistent neck mass, probable lymph node.  History of lymphoma  The various methods of treatment have been discussed with the patient and family. After consideration of risks, benefits and other options for treatment, the patient has consented to  Procedure(s): Removal of mass on neck        (N/A) as a surgical intervention .  The patient's history has been reviewed, patient examined, no change in status, stable for surgery.  I have reviewed the patient's chart and labs.  Questions were answered to the patient's satisfaction.     Weaver Tweed C.

## 2012-10-28 NOTE — Transfer of Care (Signed)
Immediate Anesthesia Transfer of Care Note  Patient: [name]  Procedure(s) Performed: Procedure(s): Removal of mass on neck        (Right)  Patient Location: PACU  Anesthesia Type:MAC  Level of Consciousness: awake, alert , oriented and sedated  Airway & Oxygen Therapy: Patient Spontanous Breathing and Patient connected to nasal cannula oxygen  Post-op Assessment: Report given to PACU RN, Post -op Vital signs reviewed and stable and Patient moving all extremities  Post vital signs: Reviewed and stable  Complications: No apparent anesthesia complications

## 2012-10-28 NOTE — Anesthesia Preprocedure Evaluation (Addendum)
Anesthesia Evaluation  Patient identified by MRN, date of birth, ID band Patient awake    Reviewed: Allergy & Precautions, H&P , NPO status , Patient's Chart, lab work & pertinent test results  Airway Mallampati: II      Dental  (+) Partial Lower, Partial Upper and Dental Advisory Given   Pulmonary  breath sounds clear to auscultation        Cardiovascular Rhythm:Regular Rate:Normal     Neuro/Psych    GI/Hepatic   Endo/Other    Renal/GU      Musculoskeletal   Abdominal   Peds  Hematology   Anesthesia Other Findings   Reproductive/Obstetrics                           Anesthesia Physical Anesthesia Plan  ASA: III  Anesthesia Plan: MAC   Post-op Pain Management:    Induction: Intravenous  Airway Management Planned: Natural Airway and Simple Face Mask  Additional Equipment:   Intra-op Plan:   Post-operative Plan:   Informed Consent: I have reviewed the patients History and Physical, chart, labs and discussed the procedure including the risks, benefits and alternatives for the proposed anesthesia with the patient or authorized representative who has indicated his/her understanding and acceptance.   Dental advisory given  Plan Discussed with: CRNA and Anesthesiologist  Anesthesia Plan Comments: (H/O lymphoma S/P chemotherapy with rituxan completed one year ago now with R posterior neck mass Hypothyroidism Pacemaker 09/15/11 for sick sinus syndrome  Plan MAC  Kipp Brood, MD )       Anesthesia Quick Evaluation

## 2012-10-31 ENCOUNTER — Encounter (HOSPITAL_COMMUNITY): Payer: Self-pay | Admitting: Surgery

## 2012-11-02 ENCOUNTER — Other Ambulatory Visit: Payer: Self-pay | Admitting: Obstetrics and Gynecology

## 2012-11-07 ENCOUNTER — Telehealth (INDEPENDENT_AMBULATORY_CARE_PROVIDER_SITE_OTHER): Payer: Self-pay | Admitting: General Surgery

## 2012-11-07 NOTE — Telephone Encounter (Signed)
Patient called status post removal right sided neck lymph node on 10/28/2012. Last week she had numbness along her right ear, shoulder and face. Now she states the numbness is gone but these areas are sore like they are bruised. She states her shoulder even hurts to pick up a cup of coffee and when she wears her glasses it hurts to put pressure above her ear. She is having no fevers or other symptoms. Please advise. [phone].

## 2012-11-08 ENCOUNTER — Telehealth: Payer: Self-pay | Admitting: *Deleted

## 2012-11-08 ENCOUNTER — Encounter: Payer: Self-pay | Admitting: *Deleted

## 2012-11-08 ENCOUNTER — Telehealth (INDEPENDENT_AMBULATORY_CARE_PROVIDER_SITE_OTHER): Payer: Self-pay

## 2012-11-08 NOTE — Telephone Encounter (Signed)
LMOM for pt to call me back after her message left yesterday.

## 2012-11-08 NOTE — Telephone Encounter (Signed)
The lymph node was very superficial.  Probably mild soreness due to inadequate pain control and hematoma.  Heat and anti-inflammatories and time should help.  We will try and send her exercises for her neck and upper extremities to avoid persistent stiffness.  Pathology on lymph node shows lymphoma.  Attempted to reach patient.  Left message.  Discussed with her oncologist, Dr. Gwenyth Bouillon.  He suspects will just need to be monitored.  Okay to keep appointment in July.  I suspect patient will wish to see him sooner.  He said that was fine.  We will try and reach the patient again.

## 2012-11-08 NOTE — Telephone Encounter (Signed)
Called [name] again b/c I have not heard back from her from this a.m. I spoke to the [name] about her arm pain and advised [name] that she needed to be using heat along with antinflammatorie for the pain. The [name] needs to be using the arm to work out the soreness along with doing our breast cancer exercises for the arm pain. I advised [name] that I would mail the exercises to the home. I did give [name] her path report of it showing a lymphoma. Dr Michaell Cowing did speak with Dr Gwenyth Bouillon this am about the path report and [name] should just follow up with their office. The [name] has a f/u appt on 01/02/13 but wants to be seen earlier than July. I advised [name] that I would call Dr Sharlene Dory nurse to see about moving the appt up earlier. The [name] requested for a copy of the path report be faxed to her home at [phone].

## 2012-11-08 NOTE — Telephone Encounter (Signed)
Pt called asking if Dr Donnald Garre wants to see pt sooner then 12/2012 since she had a lymph node biopsy performed and it is positive.  Results given to Dr Donnald Garre to review and he will advise when to f/u.  Pt is aware we will call her back.  SLJ

## 2012-11-09 ENCOUNTER — Encounter: Payer: Self-pay | Admitting: Internal Medicine

## 2012-11-09 ENCOUNTER — Telehealth: Payer: Self-pay | Admitting: Internal Medicine

## 2012-11-09 ENCOUNTER — Other Ambulatory Visit: Payer: Self-pay | Admitting: Medical Oncology

## 2012-11-09 NOTE — Telephone Encounter (Signed)
I don't think bipolar cautery should not be an issue - check with Shaquila or device representative.  I cannot remember if she is pacemaker dependent or not.  Thanks.  -[place]

## 2012-11-09 NOTE — Telephone Encounter (Signed)
Dr. Rennis Golden gave a note stating pt. should have a magnet placed over pacemaker during upcoming biopsy of  Lymph node.[facility] called back saying that they were an outpatient facility and didn't place magnets on pacemakers, requesting permission to do procedure without the magnet. Procedure is going to require use of a bi-polar cautery. Please advise.

## 2012-11-09 NOTE — Telephone Encounter (Signed)
Please call Shelia-concerning-Dwayne Bolinger!

## 2012-11-14 ENCOUNTER — Ambulatory Visit (INDEPENDENT_AMBULATORY_CARE_PROVIDER_SITE_OTHER): Payer: PRIVATE HEALTH INSURANCE | Admitting: Surgery

## 2012-11-14 ENCOUNTER — Other Ambulatory Visit: Payer: Self-pay | Admitting: Internal Medicine

## 2012-11-14 ENCOUNTER — Encounter (INDEPENDENT_AMBULATORY_CARE_PROVIDER_SITE_OTHER): Payer: PRIVATE HEALTH INSURANCE | Admitting: Surgery

## 2012-11-14 ENCOUNTER — Encounter (INDEPENDENT_AMBULATORY_CARE_PROVIDER_SITE_OTHER): Payer: Self-pay | Admitting: Surgery

## 2012-11-14 VITALS — BP 136/82 | HR 97 | Temp 96.6°F | Ht 63.0 in | Wt 143.2 lb

## 2012-11-14 DIAGNOSIS — R599 Enlarged lymph nodes, unspecified: Secondary | ICD-10-CM

## 2012-11-14 DIAGNOSIS — C8589 Other specified types of non-Hodgkin lymphoma, extranodal and solid organ sites: Secondary | ICD-10-CM

## 2012-11-14 DIAGNOSIS — C8590 Non-Hodgkin lymphoma, unspecified, unspecified site: Secondary | ICD-10-CM

## 2012-11-14 DIAGNOSIS — R590 Localized enlarged lymph nodes: Secondary | ICD-10-CM

## 2012-11-14 NOTE — Progress Notes (Signed)
Subjective:     Patient ID: [name], female   DOB: Oct 28, 1934, 78 y.o.   MRN: [id number]  HPI  [name]  May 24, 1935 [id number]  Patient Care Team: Edwyna Perfect, MD as PCP - General (Internal Medicine) Storm Frisk, MD as Attending Physician (Pulmonary Disease) Izell Russell, MD as Consulting Physician (Endocrinology) Si Gaul, MD as Consulting Physician (Medical Oncology) Thurmon Fair, MD as Consulting Physician (Cardiology)  This patient is a 78 y.o.female who presents today for surgical evaluation s/p excision of lymph node 10/28/2012:  The patient comes in today feeling better.  She did struggle with a fair amount of numbness and swelling and discomfort the first few days.  That is differential on her shoulder and arm.  That is gradually improving.  Not taking narcotics.  Worried her right neck pain as a little more swollen.  Concern for infection.  Eating well.  Swelling well.  Energy level okay.  No drainage.  No bruising.  Diagnosis Lymph node for lymphoma, Right neck - FOLLICULAR LYMPHOMA, GRADE 3/3, WITH A PREDOMINANT FOLLICULAR PATTERN - SEE ONCOLOGY TABLE. Microscopic Comment LYMPHOMA Histologic type: Non Hodgkin's lymphoma, follicular center cell type with a predominant follicular pattern. Grade (if applicable): Favor high grade (grade 3/3) Flow cytometry: Monoclonal, kappa restricted B cell population expressing pan B cell antigens including CD20 ([id number]). Immunohistochemical stains: CD20, CD79a, CD21, CD10, BCL-2, BCL-6, CD3, CD43, CD5, cyclin D-1 and Ki-67 with appropriate controls. Touch preps/imprints: A mixture of small and large lymphoid cells with relative abundance of large lymphoid cells. Comments: The sections show effacement of the lymphoid architecture by a predominately follicular lymphoproliferative process characterized by numerous variably sized atypical lymphoid follicles lacking polarity or well defined mantle zones.  The follicles are composed of a mixture of large transformed lymphoid cells and small lymphoid cells with angulated nuclei, dense chromatin and small to inconspicuous nucleoli. The number of large lymphoid cells varies from one area to the other but overall there is an average of more than 15 cells/ high power field. While the lymph nodes primarily displays a follicular/nodular pattern, there is early coalescence of some follicular areas imparting an early diffuse pattern. Immunohistochemical stains were performed and show that the atypical follicular centers are strongly positive for B cell markers CD20 and CD79a in addition to expression of BCL-2, weak expression of BCL-6 and weak expression of CD43. No significant CD5, CD10 or cyclin D-1 positivity identified. CD21 highlights numerous follicular dendritic networks throughout the lymph node related to the presence of lymphoid follicles. CD3 highlights the relatively minor T cell component present in the background mostly surrounding lymphoid follicles. Ki-67 shows variable increase (up to 20% in some areas). Flow cytometric analysis also was performed and showed a monoclonal, kappa restricted B cell population expressing pan B cell antigens including CD20. The overall histologic and immunophenotype features are consistent with follicular lymphoma with a predominant follicular pattern. While the number of large lymphoid cells varies from one area to the other, the overall findings favor a high grade process (grade 3/3) (BNS:gt, 11/03/12) 1 of 2  Patient Active Problem List   Diagnosis Date Noted  . Acute bronchitis 10/09/2012  . Lymphadenopathy, cervical 06/13/2012  . Pericardial effusion after pacemaker, hospitalized at [facility] 09/25/11 09/29/2011  . Dyslipidemia, LDL 136 08/21/2011  . Hiatal hernia, large by CXR 08/21/2011  . Scoliosis 08/21/2011  . NHL (non-Hodgkin's lymphoma)-recent diagnosis 07/09/2011  . Anxiety 07/09/2011  .  Sinus bradycardia, symptomatic, s/p MDT PTVDP 09/15/11 02/11/2011  .  Hypertension 01/26/2011  . OSTEOPOROSIS 09/20/2009  . HOARSENESS 09/20/2009  . DIASTOLIC DYSFUNCTION, on 2D Jan 2011 (grade 1) 08/13/2009  . PAP SMEAR, ABNORMAL 07/04/2009  . BACK PAIN 12/07/2008  . THYROID NODULE, RIGHT 10/30/2008  . HYPOTHYROIDISM 10/30/2008  . ADJUSTMENT DISORDER WITH DEPRESSED MOOD 10/30/2008  . GERD 10/30/2008  . DYSPNEA ON EXERTION 10/30/2008    Past Medical History  Diagnosis Date  . Hypothyroidism   . Anemia     pt denies  . Osteoporosis   . Pleural effusion, bilateral     history of  . Pulmonary nodules     history of  . History of pancreatitis     pt denies  . Cystocele     history with rectocele, pt denies  . Scoliosis   . Varicose vein   . Non Hodgkin's lymphoma   . Acute bronchitis 10/09/2012  . Pacemaker 09/15/2011    Medtronic Revo  . Dysrhythmia   . Shortness of breath     with exertion  . GERD (gastroesophageal reflux disease)   . H/O hiatal hernia   . Arthritis   . Cataract     bil  . Hemorrhoid   . Pericarditis   . Atrial fibrillation     Past Surgical History  Procedure Laterality Date  . Pacemaker insertion  09/15/11    MDT  . Tonsillectomy    . Breast surgery  07/30/01    biopsy x 3, bilateral  . Abdominal hysterectomy  04/29/04    partial  . Mass excision Right 10/28/2012    Procedure: Removal of mass on neck       ;  Surgeon: Ardeth Sportsman, MD;  Location: [facility] OR;  Service: General;  Laterality: Right;    History   Social History  . Marital Status: Widowed    Spouse Name: N/A    Number of Children: 3  . Years of Education: N/A   Occupational History  . self employed    Social History Main Topics  . Smoking status: Never Smoker   . Smokeless tobacco: Never Used  . Alcohol Use: Yes     Comment: 1 glass of wine occas  . Drug Use: No  . Sexually Active: Not on file   Other Topics Concern  . Not on file   Social History Narrative  . No  narrative on file    Family History  Problem Relation Age of Onset  . Heart disease Daughter     congenital "whole in her heart"  . Stroke Mother   . Lymphoma Daughter     nonhodgkins  . Stroke Father     Current Outpatient Prescriptions  Medication Sig Dispense Refill  . acetaminophen (TYLENOL) 500 MG tablet Take 500 mg by mouth every 6 (six) hours as needed for pain.      [name] ALPRAZolam (XANAX) 0.25 MG tablet Take 0.25 mg by mouth daily as needed for anxiety.      [name] amLODipine (NORVASC) 10 MG tablet Take 10 mg by mouth every morning.      [name] aspirin 81 MG tablet Take 81 mg by mouth daily.      [name] denosumab (PROLIA) 60 MG/ML SOLN injection Inject 60 mg into the skin every 6 (six) months. Administer in upper arm, thigh, or abdomen      . hydroxypropyl methylcellulose (ISOPTO TEARS) 2.5 % ophthalmic solution Place 1 drop into both eyes 4 (four) times daily as needed (dry eyes).      [name]  KRILL OIL OMEGA-3 PO Take 1 capsule by mouth daily.      [name] levothyroxine (SYNTHROID, LEVOTHROID) 100 MCG tablet Take 100 mcg by mouth daily before breakfast.      . losartan (COZAAR) 50 MG tablet Take 50 mg by mouth every morning.       [name] omeprazole (PRILOSEC) 40 MG capsule Take 1 capsule (40 mg total) by mouth 2 (two) times daily.  180 capsule  1  . traMADol (ULTRAM) 50 MG tablet Take 1-2 tablets (50-100 mg total) by mouth every 6 (six) hours as needed for pain.  30 tablet  0   No current facility-administered medications for this visit.     Allergies  Allergen Reactions  . Amoxicillin Hives    Hives  . Codeine Hives    REACTION: "makes her crazy" Hives  . Demerol Hives  . Morphine And Related Hives  . Vicodin (Hydrocodone-Acetaminophen) Hives  . Sulfonamide Derivatives Rash    REACTION: Rash    BP 136/82  Pulse 97  Temp(Src) 96.6 F (35.9 C) (Temporal)  Ht 5\' 3"  (1.6 m)  Wt 143 lb 3.2 oz (64.955 kg)  BMI 25.37 kg/m2  SpO2 98%  No results found.   Review of Systems  Constitutional:  Negative for fever, chills, diaphoresis, appetite change and fatigue.  HENT: Positive for neck pain. Negative for ear pain, nosebleeds, congestion, sore throat, facial swelling, trouble swallowing and neck stiffness.   Eyes: Negative for photophobia and visual disturbance.  Respiratory: Negative for cough and choking.   Cardiovascular: Negative for chest pain and palpitations.  Gastrointestinal: Negative for nausea, vomiting, abdominal pain, diarrhea, constipation, anal bleeding and rectal pain.  Genitourinary: Negative for dysuria, frequency and difficulty urinating.  Musculoskeletal: Negative for myalgias and gait problem.  Skin: Negative for color change, pallor, rash and wound.  Neurological: Negative for dizziness, speech difficulty, weakness and numbness.  Hematological: Negative for adenopathy.  Psychiatric/Behavioral: Negative for confusion and agitation. The patient is not nervous/anxious.        Objective:   Physical Exam  Constitutional: She is oriented to person, place, and time. She appears well-developed and well-nourished. No distress.  HENT:  Head: Normocephalic.  Mouth/Throat: Oropharynx is clear and moist. No oropharyngeal exudate.  Eyes: Conjunctivae and EOM are normal. Pupils are equal, round, and reactive to light. No scleral icterus.  Neck: Normal range of motion. No tracheal deviation present.    Cardiovascular: Normal rate and intact distal pulses.   Pulmonary/Chest: Effort normal. No respiratory distress. She exhibits no tenderness.  Abdominal: Soft. She exhibits no distension. There is no tenderness. Hernia confirmed negative in the right inguinal area and confirmed negative in the left inguinal area.  No hernias  Genitourinary: No vaginal discharge found.  Musculoskeletal: Normal range of motion. She exhibits no tenderness.       Right shoulder: Normal. She exhibits normal range of motion, no tenderness and no bony tenderness.       Cervical back: She  exhibits normal range of motion, no tenderness and no bony tenderness.  Lymphadenopathy:       Right: No inguinal adenopathy present.       Left: No inguinal adenopathy present.  Neurological: She is alert and oriented to person, place, and time. No cranial nerve deficit. She exhibits normal muscle tone. Coordination normal.  Skin: Skin is warm and dry. No rash noted. She is not diaphoretic.  Psychiatric: She has a normal mood and affect. Her behavior is normal.  Assessment:     Two weeks status post excision of neck mass consistent with lymphoma and a lymph node.  Improving.     Plan:     I am sorry she struggled with swelling and numbness and tenderness especially the first few days.  I am glad she is better.  I recommended nonsteroidals and heat and continued arm/shoulder/neck exercises to help work through her soreness.  She is hopeful that she will continue to improve.  Keep appointment to followup with Dr. Arbutus Ped to discuss management of lymphoma observation vs. Further intervention.   Increase activity as tolerated to regular activity.  Low impact exercise such as walking an hour a day at least ideal.  Do not push through pain.  Return to clinic as needed.   Instructions discussed.  Followup with primary care physician for other health issues as would normally be done.  Questions answered.  The patient expressed understanding and appreciation

## 2012-11-14 NOTE — Patient Instructions (Signed)
Managing Pain  Pain after surgery or related to activity is often due to strain/injury to muscle, tendon, nerves and/or incisions.  This pain is usually short-term and will improve in a few months.   Many people find it helpful to do the following things TOGETHER to help speed the process of healing and to get back to regular activity more quickly:  1. Avoid heavy physical activity a.  no lifting greater than 20 pounds b. Do not "push through" the pain.  Listen to your body and avoid positions and maneuvers than reproduce the pain c. Walking is okay as tolerated, but go slowly and stop when getting sore.  d. Remember: If it hurts to do it, then don't do it! 2. Take Anti-inflammatory medication  a. Take with food/snack around the clock for 1-2 weeks i. This helps the muscle and nerve tissues become less irritable and calm down faster b. Choose ONE of the following over-the-counter medications: i. Naproxen 220mg  tabs (ex. Aleve) 1-2 pills twice a day  ii. Ibuprofen 200mg  tabs (ex. Advil, Motrin) 3-4 pills with every meal and just before bedtime iii. Acetaminophen 500mg  tabs (Tylenol) 1-2 pills with every meal and just before bedtime 3. Use a Heating pad or Ice/Cold Pack a. 4-6 times a day b. May use warm bath/hottub  or showers 4. Try Gentle Massage and/or Stretching  a. at the area of pain many times a day b. stop if you feel pain - do not overdo it  Try these steps together to help you body heal faster and avoid making things get worse.  Doing just one of these things may not be enough.    If you are not getting better after two weeks or are noticing you are getting worse, contact our office for further advice; we may need to re-evaluate you & see what other things we can do to help.  Non-Hodgkin's Lymphoma, Adult Non-Hodgkin's lymphoma is a cancer that begins in the lymphoid tissue (part of your body's defense system, which protects the body from infections, germs, and diseases).  Lymphocytes (a type of white blood cells) are found in the lymphoid tissue. Non-Hodgkin's lymphoma starts in lymphocytes. There are different types of non-Hodgkin's lymphoma. Your caregiver will help you understand the seriousness of your cancer. This will be based on the type of cells affected and how fast it is growing and spreading. CAUSES  Non-Hodgkin's lymphoma is a cancer that starts in lymphocytes. The cause of non-Hodgkin's lymphoma is not known. It is thought that viruses cause certain types of non-Hodgkin's lymphoma. But this is less common. The risk of getting this cancer increases if:   Your immune system (body's defense system) is weak, especially after an organ transplant.  You are an elderly white female.  You have a diet high in fat.  You are infected with certain viruses. SYMPTOMS  Non-Hodgkin's lymphoma can occur at any age. It can cause different symptoms such as:  Swelling of the lymph nodes.  Fever.  Excessive sweating.  Itchy skin.  Tiredness.  Weight loss.  Coughing, breathing trouble, and chest pain.  Weakness and tiredness that do not go away.  Pain, swelling. or a feeling of fullness in the abdomen. DIAGNOSIS  Your caregiver will examine you to check your general health and look for any lumps. Blood tests and biopsy (removing body tissue for testing) of the lymph node (gland) may be included. Your caregiver may suggest chest X-ray, scanning or lumbar puncture (collecting fluid from spinal column for  testing).  TREATMENT  Non-Hodgkin's lymphoma can be treated in different ways. This depends on your symptoms, the stage of your cancer when you were first diagnosed, and the speed with which it is spreading. You and your caregiver will work together and decide on the best plan. Treatment may include:  Radiation therapy (using radiation to destroy the cancer cells).  Chemotherapy (using drugs to destroy the cancer cells).  Biological therapy (using body's  immune system to treat cancer) like monoclonal antibody therapy (using antibodies that can kill or block the cancer cells).  Newer types of treatment are vaccine therapy and high-dose chemotherapy with stem cell (a type of cell) transplant (introducing healthy stem cells into the body). However, these are still under development. You may remain symptom-free for 1 to 2 years, after treatment. Non-Hodgkin's lymphoma may recur. If it recurs, your caregiver will advise you on the suitable treatment. If you are pregnant and also have non-Hodgkin's lymphoma, you need immediate treatment. Your caregiver will decide the treatment that suits you the most.  SEEK MEDICAL CARE IF:   You develop new symptoms of non-Hodgkin's lymphoma.  You have non-Hodgkin's lymphoma and continuous fever. Document Released: 12/27/2006 Document Revised: 09/07/2011 Document Reviewed: 12/27/2006 [facility] Patient Information 2013 [place], [place].

## 2012-11-15 ENCOUNTER — Telehealth: Payer: Self-pay | Admitting: Internal Medicine

## 2012-11-15 NOTE — Telephone Encounter (Signed)
Returned call.  Pt stated she had a lymph node removed from her neck a couple of weeks ago.  Stated the big vein in her neck is still swollen w/o pain.  Stated she saw Dr. Michaell Cowing yesterday and he told her he didn't do anything that would cause the swelling.  Stated he advised her to contact Dr. Rennis Golden.  Pt also c/o intermittent HAs.  Stated it's not a bad HA and she has a high tolerance to pain.  Stated she had numbness on the R side of her neck/head after the surgery for about 3 days.  Stated after that resolved she developed a bruise and had some tenderness.  Stated she still has some tenderness, but not as much as before.  Stated the vein is still protruding out.  Pt. Informed this should be addressed with Dr. Michaell Cowing or she may need to contact her pcp for further evaluation.  Pt verbalized understanding and agreed w/ plan.

## 2012-11-15 NOTE — Telephone Encounter (Signed)
Had surgery a couple weeks ago and is having problems with her jugular vein swelling . Please call her at [phone]   Thanks

## 2012-11-16 ENCOUNTER — Encounter (HOSPITAL_COMMUNITY): Payer: Self-pay

## 2012-11-16 ENCOUNTER — Ambulatory Visit (HOSPITAL_COMMUNITY)
Admission: RE | Admit: 2012-11-16 | Discharge: 2012-11-16 | Disposition: A | Discharge status: Home / Self Care | Payer: No Typology Code available for payment source | Source: Ambulatory Visit | Attending: Internal Medicine | Admitting: Internal Medicine

## 2012-11-16 DIAGNOSIS — C8589 Other specified types of non-Hodgkin lymphoma, extranodal and solid organ sites: Secondary | ICD-10-CM | POA: Insufficient documentation

## 2012-11-16 DIAGNOSIS — I517 Cardiomegaly: Secondary | ICD-10-CM | POA: Insufficient documentation

## 2012-11-16 DIAGNOSIS — C8590 Non-Hodgkin lymphoma, unspecified, unspecified site: Secondary | ICD-10-CM

## 2012-11-16 LAB — GLUCOSE, CAPILLARY: Glucose-Capillary: 75 mg/dL (ref 70–99)

## 2012-11-16 MED ORDER — FLUDEOXYGLUCOSE F - 18 (FDG) INJECTION
18.2000 | Freq: Once | INTRAVENOUS | Status: AC | PRN
  Administered 2012-11-16: 18.2 via INTRAVENOUS

## 2012-11-17 ENCOUNTER — Encounter (HOSPITAL_COMMUNITY): Payer: No Typology Code available for payment source

## 2012-11-22 ENCOUNTER — Other Ambulatory Visit: Payer: Self-pay | Admitting: Family

## 2012-11-23 ENCOUNTER — Encounter: Payer: Self-pay | Admitting: Internal Medicine

## 2012-11-23 ENCOUNTER — Other Ambulatory Visit: Payer: Self-pay | Admitting: Family Medicine

## 2012-11-23 ENCOUNTER — Ambulatory Visit (INDEPENDENT_AMBULATORY_CARE_PROVIDER_SITE_OTHER)
Admission: RE | Admit: 2012-11-23 | Discharge: 2012-11-23 | Disposition: A | Discharge status: Home / Self Care | Payer: No Typology Code available for payment source | Source: Ambulatory Visit | Attending: Internal Medicine | Admitting: Internal Medicine

## 2012-11-23 ENCOUNTER — Other Ambulatory Visit: Payer: Self-pay | Admitting: Internal Medicine

## 2012-11-23 ENCOUNTER — Other Ambulatory Visit (HOSPITAL_BASED_OUTPATIENT_CLINIC_OR_DEPARTMENT_OTHER): Payer: No Typology Code available for payment source | Admitting: Lab

## 2012-11-23 ENCOUNTER — Ambulatory Visit (INDEPENDENT_AMBULATORY_CARE_PROVIDER_SITE_OTHER): Payer: No Typology Code available for payment source | Admitting: Internal Medicine

## 2012-11-23 VITALS — BP 138/86 | HR 75 | Temp 98.1°F | Ht 63.0 in | Wt 142.0 lb

## 2012-11-23 DIAGNOSIS — R609 Edema, unspecified: Secondary | ICD-10-CM

## 2012-11-23 DIAGNOSIS — R0989 Other specified symptoms and signs involving the circulatory and respiratory systems: Secondary | ICD-10-CM

## 2012-11-23 DIAGNOSIS — C8590 Non-Hodgkin lymphoma, unspecified, unspecified site: Secondary | ICD-10-CM

## 2012-11-23 DIAGNOSIS — C8291 Follicular lymphoma, unspecified, lymph nodes of head, face, and neck: Secondary | ICD-10-CM

## 2012-11-23 DIAGNOSIS — R0602 Shortness of breath: Secondary | ICD-10-CM

## 2012-11-23 LAB — CBC WITH DIFFERENTIAL/PLATELET
BASO%: 0.8 % (ref 0.0–2.0)
Basophils Absolute: 0.1 10*3/uL (ref 0.0–0.1)
EOS%: 1.6 % (ref 0.0–7.0)
HCT: 38.8 % (ref 34.8–46.6)
HGB: 12.8 g/dL (ref 11.6–15.9)
MCH: 28.6 pg (ref 25.1–34.0)
MCHC: 32.9 g/dL (ref 31.5–36.0)
MCV: 86.9 fL (ref 79.5–101.0)
MONO%: 6.9 % (ref 0.0–14.0)
NEUT%: 61.4 % (ref 38.4–76.8)
RDW: 15.7 % — ABNORMAL HIGH (ref 11.2–14.5)

## 2012-11-23 LAB — COMPREHENSIVE METABOLIC PANEL (CC13)
AST: 23 U/L (ref 5–34)
Albumin: 4.1 g/dL (ref 3.5–5.0)
BUN: 12.5 mg/dL (ref 7.0–26.0)
Calcium: 9.4 mg/dL (ref 8.4–10.4)
Chloride: 110 mEq/L — ABNORMAL HIGH (ref 98–107)
Potassium: 3.9 mEq/L (ref 3.5–5.1)
Sodium: 144 mEq/L (ref 136–145)
Total Protein: 7.5 g/dL (ref 6.4–8.3)

## 2012-11-23 MED ORDER — ALPRAZOLAM 0.25 MG PO TABS: 0.2500 mg | ORAL_TABLET | Freq: Every day | ORAL | Status: DC | PRN

## 2012-11-23 NOTE — Telephone Encounter (Signed)
Refill- alprazolam 0.25mg  tablet. Take one tablet by mouth three times a day if needed. Last fill 4.14.14

## 2012-11-23 NOTE — Telephone Encounter (Signed)
Please advise? I don't see where we have refilled  If ok fax to [phone]

## 2012-11-23 NOTE — Patient Instructions (Signed)

## 2012-11-23 NOTE — Progress Notes (Signed)
Subjective:    Patient ID: [name], female    DOB: [birth year], 78 y.o.   MRN: [id number]  HPI  Pt presents to the clinic today with c/o a swollen artery in her right neck x 1 month. She rececnlty had a lymph node removed on that side by Dr. Michaell Cowing. She contacted him to let her know of her concerns and he said that there was nothing that he did that would cause her artery to be swollen in that area. He advised that she contact Dr. Rennis Golden. She contacted Dr. Rennis Golden and he advised her to contact Dr. Michaell Cowing or her PCP. She also c/o swelling in her bilateral feet. This comes and goes and tends to be worse in the evening. Her feet are not swollen today. She has also had some intermittent shortness of breath. It is not worse with exertion. She denies chest pain, chest tightness. Of note, pt does have a pacemaker for symptomatic bradycardia.  Review of Systems      Past Medical History  Diagnosis Date  . Hypothyroidism   . Anemia     pt denies  . Osteoporosis   . Pleural effusion, bilateral     history of  . Pulmonary nodules     history of  . History of pancreatitis     pt denies  . Cystocele     history with rectocele, pt denies  . Scoliosis   . Varicose vein   . Non Hodgkin's lymphoma   . Acute bronchitis 10/09/2012  . Pacemaker 09/15/2011    Medtronic Revo  . Dysrhythmia   . Shortness of breath     with exertion  . GERD (gastroesophageal reflux disease)   . H/O hiatal hernia   . Arthritis   . Cataract     bil  . Hemorrhoid   . Pericarditis   . Atrial fibrillation     Current Outpatient Prescriptions  Medication Sig Dispense Refill  . acetaminophen (TYLENOL) 500 MG tablet Take 500 mg by mouth every 6 (six) hours as needed for pain.      [name] ALPRAZolam (XANAX) 0.25 MG tablet Take 0.25 mg by mouth daily as needed for anxiety.      [name] amLODipine (NORVASC) 10 MG tablet Take 10 mg by mouth every morning.      [name] aspirin 81 MG tablet Take 81 mg by mouth daily.      [name] denosumab  (PROLIA) 60 MG/ML SOLN injection Inject 60 mg into the skin every 6 (six) months. Administer in upper arm, thigh, or abdomen      . hydroxypropyl methylcellulose (ISOPTO TEARS) 2.5 % ophthalmic solution Place 1 drop into both eyes 4 (four) times daily as needed (dry eyes).      [name] KRILL OIL OMEGA-3 PO Take 1 capsule by mouth daily.      [name] levothyroxine (SYNTHROID, LEVOTHROID) 100 MCG tablet Take 100 mcg by mouth daily before breakfast.      . losartan (COZAAR) 50 MG tablet Take 50 mg by mouth every morning.       [name] omeprazole (PRILOSEC) 40 MG capsule Take 1 capsule (40 mg total) by mouth 2 (two) times daily.  180 capsule  1  . traMADol (ULTRAM) 50 MG tablet Take 1-2 tablets (50-100 mg total) by mouth every 6 (six) hours as needed for pain.  30 tablet  0   No current facility-administered medications for this visit.    Allergies  Allergen Reactions  . Amoxicillin Hives  Hives  . Codeine Hives    REACTION: "makes her crazy" Hives  . Demerol Hives  . Morphine And Related Hives  . Vicodin (Hydrocodone-Acetaminophen) Hives  . Sulfonamide Derivatives Rash    REACTION: Rash    Family History  Problem Relation Age of Onset  . Heart disease Daughter     congenital "whole in her heart"  . Stroke Mother   . Lymphoma Daughter     nonhodgkins  . Stroke Father     History   Social History  . Marital Status: Widowed    Spouse Name: N/A    Number of Children: 3  . Years of Education: N/A   Occupational History  . self employed    Social History Main Topics  . Smoking status: Never Smoker   . Smokeless tobacco: Never Used  . Alcohol Use: Yes     Comment: 1 glass of wine occas  . Drug Use: No  . Sexually Active: Not on file   Other Topics Concern  . Not on file   Social History Narrative  . No narrative on file     Constitutional: Denies fever, malaise, fatigue, headache or abrupt weight changes.  Respiratory: Pt reports shortness of breath. Denies difficulty breathing,  cough or sputum production.   Cardiovascular: Pt reports a swollen artery in the neck and swelling in bilateral feet. Denies chest pain, chest tightness, palpitations or swelling in the hands.  Neurological: Pt reports intermittent dizziness. Denies difficulty with memory, difficulty with speech or problems with balance and coordination.   No other specific complaints in a complete review of systems (except as listed in HPI above).   Objective:   Physical Exam   BP 138/86  Pulse 75  Temp(Src) 98.1 F (36.7 C) (Oral)  Ht 5\' 3"  (1.6 m)  Wt 142 lb (64.411 kg)  BMI 25.16 kg/m2  SpO2 97% Wt Readings from Last 3 Encounters:  11/23/12 142 lb (64.411 kg)  11/14/12 143 lb 3.2 oz (64.955 kg)  10/18/12 145 lb (65.772 kg)    General: Appears her stated age, well developed, well nourished in NAD.[name]  Cardiovascular: Normal rate and rhythm. S1,S2 noted.  No murmur, rubs or gallops noted. Moderate JVD noted on the right side. No BLE edema. No carotid bruits noted. Pulmonary/Chest: Normal effort and positive vesicular breath sounds. No respiratory distress. Fine crackles in bilateral bases. No wheezes, or ronchi noted.  Neurological: Alert and oriented. Cranial nerves II-XII intact. Coordination normal. +DTRs bilaterally.   BMET    Component Value Date/Time   NA 140 10/25/2012 1036   K 4.6 10/25/2012 1036   CL 105 10/25/2012 1036   CO2 27 10/25/2012 1036   GLUCOSE 88 10/25/2012 1036   BUN 17 10/25/2012 1036   CREATININE 0.84 10/25/2012 1036   CREATININE 0.75 10/06/2012 1202   CALCIUM 9.6 10/25/2012 1036   GFRNONAA 66* 10/25/2012 1036   GFRAA 76* 10/25/2012 1036    Lipid Panel     Component Value Date/Time   CHOL 238* 10/06/2012 1202   TRIG 199* 10/06/2012 1202   HDL 53 10/06/2012 1202   CHOLHDL 4.5 10/06/2012 1202   VLDL 40 10/06/2012 1202   LDLCALC 145* 10/06/2012 1202    CBC    Component Value Date/Time   WBC 6.7 10/25/2012 1036   RBC 4.30 10/25/2012 1036   HGB 12.4 10/25/2012 1036   HCT  36.9 10/25/2012 1036   PLT 325 10/25/2012 1036   MCV 85.8 10/25/2012 1036   MCH 28.8  10/25/2012 1036   MCHC 33.6 10/25/2012 1036   RDW 15.2 10/25/2012 1036   LYMPHSABS 2.2 06/13/2012 1141   MONOABS 0.8 06/13/2012 1141   EOSABS 0.2 06/13/2012 1141   BASOSABS 0.1 06/13/2012 1141    Hgb A1C No results found for this basename: HGBA1C        Assessment & Plan:   JVD, peripheral edema and shortness of breath, concerning for acute heart failure:  Will check chest xray to r/o infection Will obtain echocardiogram today If abnormal, will refer pt to [facility] cardiology as pt does not prefer to go back to Dr. Rennis Golden Will obtain labs today including BNP  Will f/u after test are done

## 2012-11-25 ENCOUNTER — Ambulatory Visit (HOSPITAL_COMMUNITY): Payer: No Typology Code available for payment source | Attending: Internal Medicine | Admitting: Radiology

## 2012-11-25 DIAGNOSIS — R609 Edema, unspecified: Secondary | ICD-10-CM

## 2012-11-25 DIAGNOSIS — R0989 Other specified symptoms and signs involving the circulatory and respiratory systems: Secondary | ICD-10-CM

## 2012-11-25 DIAGNOSIS — R0602 Shortness of breath: Secondary | ICD-10-CM | POA: Insufficient documentation

## 2012-11-25 DIAGNOSIS — R0609 Other forms of dyspnea: Secondary | ICD-10-CM

## 2012-11-25 NOTE — Progress Notes (Signed)
Echocardiogram performed.  

## 2012-11-28 ENCOUNTER — Other Ambulatory Visit: Payer: Self-pay | Admitting: Internal Medicine

## 2012-11-28 DIAGNOSIS — R0989 Other specified symptoms and signs involving the circulatory and respiratory systems: Secondary | ICD-10-CM

## 2012-11-28 DIAGNOSIS — R609 Edema, unspecified: Secondary | ICD-10-CM

## 2012-11-29 ENCOUNTER — Telehealth: Payer: Self-pay | Admitting: Cardiovascular Disease

## 2012-11-29 NOTE — Telephone Encounter (Signed)
Spoke with scheduling and Dr. Tenny Craw has new pt appt available for December 02, 2012 at 4:00.  I spoke with pt and appt made for her to see Dr. Tenny Craw at this time.  She is aware if symptoms worsen prior to this appt she should be seen in ED.

## 2012-11-29 NOTE — Telephone Encounter (Signed)
New Problem  Pt is schedule to see Dr Kirke Corin on 6.24.14, pt will be a new pt. Pt states she is short of breath whenever she does anything. She said she is fine will she is sitting. She is wanting to get in sooner with Dr Kirke Corin.

## 2012-11-30 ENCOUNTER — Ambulatory Visit (HOSPITAL_BASED_OUTPATIENT_CLINIC_OR_DEPARTMENT_OTHER): Payer: No Typology Code available for payment source | Admitting: Internal Medicine

## 2012-11-30 ENCOUNTER — Telehealth: Payer: Self-pay | Admitting: *Deleted

## 2012-11-30 ENCOUNTER — Telehealth: Payer: Self-pay | Admitting: Internal Medicine

## 2012-11-30 ENCOUNTER — Encounter: Payer: Self-pay | Admitting: Internal Medicine

## 2012-11-30 VITALS — BP 148/96 | HR 109 | Temp 97.8°F | Resp 20 | Ht 63.0 in | Wt 140.9 lb

## 2012-11-30 DIAGNOSIS — C8590 Non-Hodgkin lymphoma, unspecified, unspecified site: Secondary | ICD-10-CM

## 2012-11-30 DIAGNOSIS — C8291 Follicular lymphoma, unspecified, lymph nodes of head, face, and neck: Secondary | ICD-10-CM

## 2012-11-30 NOTE — Telephone Encounter (Signed)
Left msg on triage requesting results back from echocardiogram...lmb

## 2012-11-30 NOTE — Telephone Encounter (Signed)
Ash, I have sent a note regarding her echo to you. You called her and left a message for you to call her back. She just now called back, can you give her the results.

## 2012-11-30 NOTE — Patient Instructions (Signed)
You have recurrent follicular lymphoma. We discussed treatment options including treatment with Rituxan versus observation. Follow up in 6 months with repeat CT scans

## 2012-11-30 NOTE — Progress Notes (Signed)
[facility] [phone]   [phone]  OFFICE PROGRESS NOTE  Erica Cotta, MD [address] [place] [address]  DIAGNOSIS: recurrent Non Hodgkin's lymphoma, follicular center cell type with a predominant follicular pattern.Grade (if applicable): Favor high grade (grade 3/3), initially diagnosed in November of 2012  PRIOR THERAPY: 1) Status post treatment with Rituxan weekly for 3 doses in addition to 3 tablets of Afinitor at [facility] in [place]. 2) Status post surgical excision of the right neck mass under the care of Dr. Michaell Cowing on 10/28/2012.  CURRENT THERAPY: None  INTERVAL HISTORY: [name] 78 y.o. female returns to the clinic today for follow up visit accompanied by several family members including her daughter who also oh had a diagnosis of follicular lymphoma and went through several treatment in the past.the patient recently and had surgical excision of a lymph node from the right posterior neck under the care of Dr. Michaell Cowing and the final pathology was consistent with high-grade follicular lymphoma. She is here today for evaluation and discussion of her treatment options. She had a PET scan performed recently that showed no evidence for FDG activity except for diffuse activity in the thyroid gland consistent with inflammatory process. The patient denied having any significant weight loss or night sweats. She denied having any other palpable lymphadenopathy. She denied having any bleeding issues. She has no chest pain, shortness of breath, cough or hemoptysis.  MEDICAL HISTORY: Past Medical History  Diagnosis Date  . Hypothyroidism   . Anemia     pt denies  . Osteoporosis   . Pleural effusion, bilateral     history of  . Pulmonary nodules     history of  . History of pancreatitis     pt denies  . Cystocele     history with rectocele, pt denies  . Scoliosis   . Varicose vein   . Non Hodgkin's  lymphoma   . Acute bronchitis 10/09/2012  . Pacemaker 09/15/2011    Medtronic Revo  . Dysrhythmia   . Shortness of breath     with exertion  . GERD (gastroesophageal reflux disease)   . H/O hiatal hernia   . Arthritis   . Cataract     bil  . Hemorrhoid   . Pericarditis   . Atrial fibrillation     ALLERGIES:  is allergic to amoxicillin; codeine; demerol; morphine and related; vicodin; and sulfonamide derivatives.  MEDICATIONS:  Current Outpatient Prescriptions  Medication Sig Dispense Refill  . acetaminophen (TYLENOL) 500 MG tablet Take 500 mg by mouth every 6 (six) hours as needed for pain.      [name] ALPRAZolam (XANAX) 0.25 MG tablet Take 1 tablet (0.25 mg total) by mouth daily as needed for sleep or anxiety.  30 tablet  1  . amLODipine (NORVASC) 10 MG tablet Take 10 mg by mouth every morning.      [name] aspirin 81 MG tablet Take 81 mg by mouth daily.      [name] denosumab (PROLIA) 60 MG/ML SOLN injection Inject 60 mg into the skin every 6 (six) months. Administer in upper arm, thigh, or abdomen      . hydroxypropyl methylcellulose (ISOPTO TEARS) 2.5 % ophthalmic solution Place 1 drop into both eyes 4 (four) times daily as needed (dry eyes).      [name] KRILL OIL OMEGA-3 PO Take 1 capsule by mouth daily.      [name] levothyroxine (SYNTHROID, LEVOTHROID)  100 MCG tablet Take 100 mcg by mouth daily before breakfast.      . omeprazole (PRILOSEC) 40 MG capsule Take 1 capsule (40 mg total) by mouth 2 (two) times daily.  180 capsule  1  . traMADol (ULTRAM) 50 MG tablet Take 1-2 tablets (50-100 mg total) by mouth every 6 (six) hours as needed for pain.  30 tablet  0   No current facility-administered medications for this visit.    SURGICAL HISTORY:  Past Surgical History  Procedure Laterality Date  . Pacemaker insertion  09/15/11    MDT  . Tonsillectomy    . Breast surgery  07/30/01    biopsy x 3, bilateral  . Abdominal hysterectomy  04/29/04    partial  . Mass excision Right 10/28/2012    Procedure:  Removal of mass on neck       ;  Surgeon: Ardeth Sportsman, MD;  Location: [facility] OR;  Service: General;  Laterality: Right;    REVIEW OF SYSTEMS:  A comprehensive review of systems was negative.   PHYSICAL EXAMINATION: General appearance: alert, cooperative and no distress Head: Normocephalic, without obvious abnormality, atraumatic Neck: no adenopathy Lymph nodes: Cervical, supraclavicular, and axillary nodes normal. Resp: clear to auscultation bilaterally Cardio: regular rate and rhythm, S1, S2 normal, no murmur, click, rub or gallop GI: soft, non-tender; bowel sounds normal; no masses,  no organomegaly Extremities: extremities normal, atraumatic, no cyanosis or edema Neurologic: Alert and oriented X 3, normal strength and tone. Normal symmetric reflexes. Normal coordination and gait  ECOG PERFORMANCE STATUS: 0 - Asymptomatic  Blood pressure 148/96, pulse 109, temperature 97.8 F (36.6 C), temperature source Oral, resp. rate 20, height 5\' 3"  (1.6 m), weight 140 lb 14.4 oz (63.912 kg), SpO2 100.00%.  LABORATORY DATA: Lab Results  Component Value Date   WBC 8.4 11/23/2012   HGB 12.8 11/23/2012   HCT 38.8 11/23/2012   MCV 86.9 11/23/2012   PLT 220 11/23/2012      Chemistry      Component Value Date/Time   NA 144 11/23/2012 1116   NA 140 10/25/2012 1036   K 3.9 11/23/2012 1116   K 4.6 10/25/2012 1036   CL 110* 11/23/2012 1116   CL 105 10/25/2012 1036   CO2 23 11/23/2012 1116   CO2 27 10/25/2012 1036   BUN 12.5 11/23/2012 1116   BUN 17 10/25/2012 1036   CREATININE 0.8 11/23/2012 1116   CREATININE 0.84 10/25/2012 1036   CREATININE 0.75 10/06/2012 1202      Component Value Date/Time   CALCIUM 9.4 11/23/2012 1116   CALCIUM 9.6 10/25/2012 1036   ALKPHOS 85 11/23/2012 1116   ALKPHOS 77 10/06/2012 1202   AST 23 11/23/2012 1116   AST 28 10/06/2012 1202   ALT 16 11/23/2012 1116   ALT 19 10/06/2012 1202   BILITOT 0.26 11/23/2012 1116   BILITOT 0.3 10/06/2012 1202       RADIOGRAPHIC STUDIES: Dg  Chest 2 View  11/23/2012   *RADIOLOGY REPORT*  Clinical Data: Peripheral edema with shortness of breath  CHEST - 2 VIEW  Comparison: 07/09/2012  Findings: The cardiac shadow is stable.  A pacing device is again seen and stable.  Elevation of the left hemidiaphragm is again noted.  A vague 5 mm nodule is noted in the mid right lung.  This corresponds to a calcified granulomas seen on previous PET CT and is roughly stable in appearance.  No other definitive nodular densities are seen.  IMPRESSION: No acute abnormality  is noted.   Original Report Authenticated By: Alcide Clever, M.D.   Nm Pet Image Restag (ps) Skull Base To Thigh  11/16/2012   *RADIOLOGY REPORT*  Clinical Data: Subsequent treatment strategy for non Hodgkin lymphoma.  NUCLEAR MEDICINE PET SKULL BASE TO THIGH  Fasting Blood Glucose:  75  Technique:  18.2 mCi F-18 FDG was injected intravenously. CT data was obtained and used for attenuation correction and anatomic localization only.  (This was not acquired as a diagnostic CT examination.) Additional exam technical data entered on technologist worksheet.  Comparison:  07/13/2012 and 12/09/2011.  Findings:  Neck: No hypermetabolic lymph nodes in the neck.  Thyroid remains hypermetabolic, as on prior exams.  CT images show no acute findings.  Chest:  No hypermetabolic mediastinal or hilar lymph nodes.  No hypermetabolic pulmonary nodules.  CT images show no acute findings.  No pericardial or pleural effusion.  Heart is mildly enlarged.  There is a large hiatal hernia.  Abdomen/Pelvis:  No abnormal hypermetabolic activity within the liver, pancreas, adrenal glands or spleen.  No hypermetabolic lymph nodes.  CT images show no acute findings.  No free fluid.  Skeleton:  No focal hypermetabolic activity to suggest skeletal metastasis.  IMPRESSION:  1.  No evidence of recurrent disease. 2.  Stable hypermetabolism in the thyroid.   Original Report Authenticated By: Leanna Battles, M.D.    ASSESSMENT: this is  a very pleasant 78 years old white female with recurrent follicular lymphoma currently high-grade status post excision of a right neck lymph node. The patient has no other evidence for FDG activity on the recent PET scan.   PLAN: I have a lengthy discussion with the patient and her family about her condition. I gave the patient the option of continuing observation since she is currently asymptomatic versus consideration of systemic treatment with single agent Rituxan as she responded very well tolerated the past versus treatment with bendamustine/Rituxan or CHOP/Rituxan if she has evidence for further disease recurrence. We went over the options in details. The patient and her family would like to take some time to think about these options before making a final decision. If the patient decided against treatment at this point, I would see her back for follow up visit in 6 months with repeat CT scan of the neck, chest, abdomen and pelvis for evaluation of her disease She was advised to call me immediately if she has any concerning symptoms in the interval.  All questions were answered. The patient knows to call the clinic with any problems, questions or concerns. We can certainly see the patient much sooner if necessary.  I spent 20 minutes counseling the patient face to face. The total time spent in the appointment was 30 minutes.

## 2012-11-30 NOTE — Telephone Encounter (Signed)
Pt informed of results of ECHO in result note.

## 2012-11-30 NOTE — Telephone Encounter (Signed)
s.w. pt and sched appts for DEC....pt ok and aware

## 2012-12-02 ENCOUNTER — Encounter: Payer: Self-pay | Admitting: Internal Medicine

## 2012-12-02 ENCOUNTER — Other Ambulatory Visit: Payer: Self-pay | Admitting: Internal Medicine

## 2012-12-02 ENCOUNTER — Ambulatory Visit (INDEPENDENT_AMBULATORY_CARE_PROVIDER_SITE_OTHER): Payer: No Typology Code available for payment source | Admitting: Internal Medicine

## 2012-12-02 VITALS — BP 137/73 | HR 70 | Ht 63.0 in | Wt 141.0 lb

## 2012-12-02 DIAGNOSIS — I10 Essential (primary) hypertension: Secondary | ICD-10-CM

## 2012-12-02 NOTE — Patient Instructions (Addendum)
LABS TODAY:  BMET & BNP

## 2012-12-04 NOTE — Progress Notes (Signed)
HPI  Patient is a 78 yo who is referred fore evaluation of elevated neck veins and edema The patient recently underwent a LN removal in R neck  Since surgery she has noticed her neck vein is elevated on that side.   She denies SOB No CP  Allergies  Allergen Reactions  . Amoxicillin Hives    Hives  . Codeine Hives    REACTION: "makes her crazy" Hives  . Demerol Hives  . Morphine And Related Hives  . Vicodin (Hydrocodone-Acetaminophen) Hives  . Sulfonamide Derivatives Rash    REACTION: Rash    Current Outpatient Prescriptions  Medication Sig Dispense Refill  . acetaminophen (TYLENOL) 500 MG tablet Take 500 mg by mouth every 6 (six) hours as needed for pain.      [name] ALPRAZolam (XANAX) 0.25 MG tablet Take 1 tablet (0.25 mg total) by mouth daily as needed for sleep or anxiety.  30 tablet  1  . amLODipine (NORVASC) 10 MG tablet Take 10 mg by mouth every morning.      [name] aspirin 81 MG tablet Take 81 mg by mouth daily.      [name] denosumab (PROLIA) 60 MG/ML SOLN injection Inject 60 mg into the skin every 6 (six) months. Administer in upper arm, thigh, or abdomen      . hydroxypropyl methylcellulose (ISOPTO TEARS) 2.5 % ophthalmic solution Place 1 drop into both eyes 4 (four) times daily as needed (dry eyes).      [name] KRILL OIL OMEGA-3 PO Take 1 capsule by mouth daily.      [name] levothyroxine (SYNTHROID, LEVOTHROID) 100 MCG tablet Take 100 mcg by mouth daily before breakfast.      . omeprazole (PRILOSEC) 40 MG capsule Take 1 capsule (40 mg total) by mouth 2 (two) times daily.  180 capsule  1  . traMADol (ULTRAM) 50 MG tablet Take 1-2 tablets (50-100 mg total) by mouth every 6 (six) hours as needed for pain.  30 tablet  0   No current facility-administered medications for this visit.    Past Medical History  Diagnosis Date  . Hypothyroidism   . Anemia     pt denies  . Osteoporosis   . Pleural effusion, bilateral     history of  . Pulmonary nodules     history of  . History of pancreatitis      pt denies  . Cystocele     history with rectocele, pt denies  . Scoliosis   . Varicose vein   . Non Hodgkin's lymphoma   . Acute bronchitis 10/09/2012  . Pacemaker 09/15/2011    Medtronic Revo  . Dysrhythmia   . Shortness of breath     with exertion  . GERD (gastroesophageal reflux disease)   . H/O hiatal hernia   . Arthritis   . Cataract     bil  . Hemorrhoid   . Pericarditis   . Atrial fibrillation     Past Surgical History  Procedure Laterality Date  . Pacemaker insertion  09/15/11    MDT  . Tonsillectomy    . Breast surgery  07/30/01    biopsy x 3, bilateral  . Abdominal hysterectomy  04/29/04    partial  . Mass excision Right 10/28/2012    Procedure: Removal of mass on neck       ;  Surgeon: Ardeth Sportsman, MD;  Location: [facility] OR;  Service: General;  Laterality: Right;    Family History  Problem Relation Age of Onset  .  Heart disease Daughter     congenital "whole in her heart"  . Stroke Mother   . Lymphoma Daughter     nonhodgkins  . Stroke Father     History   Social History  . Marital Status: Widowed    Spouse Name: N/A    Number of Children: 3  . Years of Education: N/A   Occupational History  . self employed    Social History Main Topics  . Smoking status: Never Smoker   . Smokeless tobacco: Never Used  . Alcohol Use: Yes     Comment: 1 glass of wine occas  . Drug Use: No  . Sexually Active: Not on file   Other Topics Concern  . Not on file   Social History Narrative  . No narrative on file    Review of Systems:  All systems reviewed.  They are negative to the above problem except as previously stated.  Vital Signs: BP 137/73  Pulse 70  Ht 5\' 3"  (1.6 m)  Wt 141 lb (63.957 kg)  BMI 24.98 kg/m2  Physical Exam  HEENT:  Normocephalic, atraumatic. EOMI, PERRLA.  Neck: JVP is normal.  No bruits. Prominent R sternocleidomastoidNo adenopathy Lungs: clear to auscultation. No rales no wheezes.  Heart: Regular rate and rhythm. Normal S1,  S2. No S3.   No significant murmurs. PMI not displaced.  Abdomen:  Supple, nontender. Normal bowel sounds. No masses. No hepatomegaly.  Extremities:   Good distal pulses throughout. Tr lower extremity edema.  Musculoskeletal :moving all extremities.  Neuro:   alert and oriented x3.  CN II-XII grossly intact.   Assessment and Plan:  1.  R neck abnormality  The patient does not appear to have JVD  Volume status overall looks good. Tr LE edema  Will check labs and review CT scan On exam her neck does have prominence of the R sternocleidomastoid, prob from recent surgery to this regin. I would follow if lab work negative.

## 2012-12-07 ENCOUNTER — Other Ambulatory Visit: Payer: Self-pay | Admitting: Internal Medicine

## 2012-12-07 DIAGNOSIS — C8290 Follicular lymphoma, unspecified, unspecified site: Secondary | ICD-10-CM

## 2012-12-07 LAB — BASIC METABOLIC PANEL
BUN: 16 mg/dL (ref 6–23)
CO2: 27 mEq/L (ref 19–32)
Chloride: 108 mEq/L (ref 96–112)
Potassium: 3.9 mEq/L (ref 3.5–5.1)

## 2012-12-07 LAB — BRAIN NATRIURETIC PEPTIDE: Pro B Natriuretic peptide (BNP): 81 pg/mL (ref 0.0–100.0)

## 2012-12-08 ENCOUNTER — Telehealth: Payer: Self-pay | Admitting: Internal Medicine

## 2012-12-08 ENCOUNTER — Telehealth: Payer: Self-pay | Admitting: Medical Oncology

## 2012-12-08 ENCOUNTER — Telehealth: Payer: Self-pay | Admitting: *Deleted

## 2012-12-08 ENCOUNTER — Ambulatory Visit (INDEPENDENT_AMBULATORY_CARE_PROVIDER_SITE_OTHER): Payer: No Typology Code available for payment source | Admitting: *Deleted

## 2012-12-08 DIAGNOSIS — R0602 Shortness of breath: Secondary | ICD-10-CM

## 2012-12-08 DIAGNOSIS — I519 Heart disease, unspecified: Secondary | ICD-10-CM

## 2012-12-08 DIAGNOSIS — I498 Other specified cardiac arrhythmias: Secondary | ICD-10-CM

## 2012-12-08 NOTE — Telephone Encounter (Signed)
Message copied by Charma Igo on Thu Dec 08, 2012 10:27 AM ------      Message from: Si Gaul      Created: Wed Dec 07, 2012  5:11 PM       Done. Start 6/18      ----- Message -----         From: Charma Igo, RN         Sent: 12/07/2012   3:29 PM           To: Si Gaul, MD            She wants to proceed with "maintenance rituxan treatment"      Brittanie Dosanjh       ------

## 2012-12-08 NOTE — Telephone Encounter (Signed)
Per staff message and POF I have scheduled appts.  JMW  

## 2012-12-08 NOTE — Telephone Encounter (Signed)
s.w. pt and advised on 6.17.14 and 6.18 appts...pt ok and aware

## 2012-12-08 NOTE — Telephone Encounter (Signed)
New problem    C/o sob.

## 2012-12-08 NOTE — Telephone Encounter (Signed)
Will forward to Triage to call pt.  I spoke to Katina Dung, Charity fundraiser.

## 2012-12-08 NOTE — Telephone Encounter (Signed)
I spoke with patient.  Pt states she continues to be SOB, the same as at the time of her recent office visit with Dr Tenny Craw. I reviewed recent BMET/BNP results with patient. Pt asking to make arrangements for pacemaker follow up here.She states her pacer was placed March 2013 by Dr Harrold Donath at [facility], but she is changing to have her pacemaker followed here. It was to be checked when she saw Dr Tenny Craw recently but it became too late for staff to check and it was not done. I am going to forward this to [facility] to follow up with patient for pacer follow up.

## 2012-12-08 NOTE — Telephone Encounter (Signed)
Scheduled

## 2012-12-13 ENCOUNTER — Encounter: Payer: Self-pay | Admitting: *Deleted

## 2012-12-13 ENCOUNTER — Other Ambulatory Visit: Payer: No Typology Code available for payment source

## 2012-12-14 ENCOUNTER — Other Ambulatory Visit (HOSPITAL_BASED_OUTPATIENT_CLINIC_OR_DEPARTMENT_OTHER): Payer: No Typology Code available for payment source

## 2012-12-14 ENCOUNTER — Ambulatory Visit (HOSPITAL_BASED_OUTPATIENT_CLINIC_OR_DEPARTMENT_OTHER): Payer: No Typology Code available for payment source

## 2012-12-14 VITALS — BP 105/65 | HR 60 | Temp 98.0°F

## 2012-12-14 DIAGNOSIS — C8290 Follicular lymphoma, unspecified, unspecified site: Secondary | ICD-10-CM

## 2012-12-14 DIAGNOSIS — C8291 Follicular lymphoma, unspecified, lymph nodes of head, face, and neck: Secondary | ICD-10-CM

## 2012-12-14 DIAGNOSIS — Z5112 Encounter for antineoplastic immunotherapy: Secondary | ICD-10-CM

## 2012-12-14 DIAGNOSIS — C8590 Non-Hodgkin lymphoma, unspecified, unspecified site: Secondary | ICD-10-CM

## 2012-12-14 LAB — CBC WITH DIFFERENTIAL/PLATELET
BASO%: 1.3 % (ref 0.0–2.0)
Eosinophils Absolute: 0.2 10*3/uL (ref 0.0–0.5)
HCT: 38.6 % (ref 34.8–46.6)
MCHC: 32.4 g/dL (ref 31.5–36.0)
MONO#: 0.6 10*3/uL (ref 0.1–0.9)
NEUT#: 3.3 10*3/uL (ref 1.5–6.5)
NEUT%: 48.1 % (ref 38.4–76.8)
Platelets: 288 10*3/uL (ref 145–400)
RBC: 4.47 10*6/uL (ref 3.70–5.45)
WBC: 6.9 10*3/uL (ref 3.9–10.3)
lymph#: 2.7 10*3/uL (ref 0.9–3.3)
nRBC: 0 % (ref 0–0)

## 2012-12-14 LAB — COMPREHENSIVE METABOLIC PANEL (CC13)
AST: 22 U/L (ref 5–34)
Albumin: 3.6 g/dL (ref 3.5–5.0)
Alkaline Phosphatase: 78 U/L (ref 40–150)
Calcium: 9.5 mg/dL (ref 8.4–10.4)
Chloride: 109 mEq/L — ABNORMAL HIGH (ref 98–107)
Potassium: 3.9 mEq/L (ref 3.5–5.1)
Sodium: 142 mEq/L (ref 136–145)
Total Protein: 6.9 g/dL (ref 6.4–8.3)

## 2012-12-14 MED ORDER — SODIUM CHLORIDE 0.9 % IV SOLN
Freq: Once | INTRAVENOUS | Status: AC
  Administered 2012-12-14: 11:00:00 via INTRAVENOUS

## 2012-12-14 MED ORDER — ACETAMINOPHEN 325 MG PO TABS
650.0000 mg | ORAL_TABLET | Freq: Once | ORAL | Status: AC
  Administered 2012-12-14: 650 mg via ORAL

## 2012-12-14 MED ORDER — DIPHENHYDRAMINE HCL 25 MG PO CAPS
50.0000 mg | ORAL_CAPSULE | Freq: Once | ORAL | Status: AC
Start: 2012-12-14 — End: 2012-12-14
  Administered 2012-12-14: 50 mg via ORAL

## 2012-12-14 MED ORDER — SODIUM CHLORIDE 0.9 % IV SOLN
375.0000 mg/m2 | Freq: Once | INTRAVENOUS | Status: AC
  Administered 2012-12-14: 600 mg via INTRAVENOUS
  Filled 2012-12-14: qty 60

## 2012-12-14 MED ORDER — SODIUM CHLORIDE 0.9 % IV SOLN: 375.0000 mg/m2 | Freq: Once | INTRAVENOUS | Status: DC

## 2012-12-14 NOTE — Patient Instructions (Addendum)
[facility] Discharge Instructions for Patients Receiving Chemotherapy  Today you received the following chemotherapy agents:   To help prevent nausea and vomiting after your treatment, we encourage you to take your nausea medication.  Take it as often as prescribed.     If you develop nausea and vomiting that is not controlled by your nausea medication, call the clinic. If it is after clinic hours your family physician or the after hours number for the clinic or go to the Emergency Department.   BELOW ARE SYMPTOMS THAT SHOULD BE REPORTED IMMEDIATELY:  *FEVER GREATER THAN 100.5 F  *CHILLS WITH OR WITHOUT FEVER  NAUSEA AND VOMITING THAT IS NOT CONTROLLED WITH YOUR NAUSEA MEDICATION  *UNUSUAL SHORTNESS OF BREATH  *UNUSUAL BRUISING OR BLEEDING  TENDERNESS IN MOUTH AND THROAT WITH OR WITHOUT PRESENCE OF ULCERS  *URINARY PROBLEMS  *BOWEL PROBLEMS  UNUSUAL RASH Items with * indicate a potential emergency and should be followed up as soon as possible.  One of the nurses will contact you 24 hours after your treatment. Please let the nurse know about any problems that you may have experienced. Feel free to call the clinic you have any questions or concerns. The clinic phone number is [phone].   I have been informed and understand all the instructions given to me. I know to contact the clinic, my physician, or go to the Emergency Department if any problems should occur. I do not have any questions at this time, but understand that I may call the clinic during office hours   should I have any questions or need assistance in obtaining follow up care.    __________________________________________  _____________  __________ Signature of Patient or Authorized Representative            Date                   Time    __________________________________________ Nurse's Signature    Rituximab injection What is this medicine? RITUXIMAB (ri TUX i mab) is a monoclonal  antibody. This medicine changes the way the body's immune system works. It is used commonly to treat non-Hodgkin's lymphoma and other conditions. In cancer cells, this drug targets a specific protein within cancer cells and stops the cancer cells from growing. It is also used to treat rhuematoid arthritis (RA). In RA, this medicine slow the inflammatory process and help reduce joint pain and swelling. This medicine is often used with other cancer or arthritis medications. This medicine may be used for other purposes; ask your health care provider or pharmacist if you have questions. What should I tell my health care provider before I take this medicine? They need to know if you have any of these conditions: -blood disorders -heart disease -history of hepatitis B -infection (especially a virus infection such as chickenpox, cold sores, or herpes) -irregular heartbeat -kidney disease -lung or breathing disease, like asthma -lupus -an unusual or allergic reaction to rituximab, mouse proteins, other medicines, foods, dyes, or preservatives -pregnant or trying to get pregnant -breast-feeding How should I use this medicine? This medicine is for infusion into a vein. It is administered in a hospital or clinic by a specially trained health care professional. A special MedGuide will be given to you by the pharmacist with each prescription and refill. Be sure to read this information carefully each time. Talk to your pediatrician regarding the use of this medicine in children. This medicine is not approved for use in children. Overdosage: If  you think you have taken too much of this medicine contact a poison control center or emergency room at once. NOTE: This medicine is only for you. Do not share this medicine with others. What if I miss a dose? It is important not to miss a dose. Call your doctor or health care professional if you are unable to keep an appointment. What may interact with this  medicine? -cisplatin -medicines for blood pressure -some other medicines for arthritis -vaccines This list may not describe all possible interactions. Give your health care provider a list of all the medicines, herbs, non-prescription drugs, or dietary supplements you use. Also tell them if you smoke, drink alcohol, or use illegal drugs. Some items may interact with your medicine. What should I watch for while using this medicine? Report any side effects that you notice during your treatment right away, such as changes in your breathing, fever, chills, dizziness or lightheadedness. These effects are more common with the first dose. Visit your prescriber or health care professional for checks on your progress. You will need to have regular blood work. Report any other side effects. The side effects of this medicine can continue after you finish your treatment. Continue your course of treatment even though you feel ill unless your doctor tells you to stop. Call your doctor or health care professional for advice if you get a fever, chills or sore throat, or other symptoms of a cold or flu. Do not treat yourself. This drug decreases your body's ability to fight infections. Try to avoid being around people who are sick. This medicine may increase your risk to bruise or bleed. Call your doctor or health care professional if you notice any unusual bleeding. Be careful brushing and flossing your teeth or using a toothpick because you may get an infection or bleed more easily. If you have any dental work done, tell your dentist you are receiving this medicine. Avoid taking products that contain aspirin, acetaminophen, ibuprofen, naproxen, or ketoprofen unless instructed by your doctor. These medicines may hide a fever. Do not become pregnant while taking this medicine. Women should inform their doctor if they wish to become pregnant or think they might be pregnant. There is a potential for serious side effects  to an unborn child. Talk to your health care professional or pharmacist for more information. Do not breast-feed an infant while taking this medicine. What side effects may I notice from receiving this medicine? Side effects that you should report to your doctor or health care professional as soon as possible: -allergic reactions like skin rash, itching or hives, swelling of the face, lips, or tongue -low blood counts - this medicine may decrease the number of white blood cells, red blood cells and platelets. You may be at increased risk for infections and bleeding. -signs of infection - fever or chills, cough, sore throat, pain or difficulty passing urine -signs of decreased platelets or bleeding - bruising, pinpoint red spots on the skin, black, tarry stools, blood in the urine -signs of decreased red blood cells - unusually weak or tired, fainting spells, lightheadedness -breathing problems -confused, not responsive -chest pain -fast, irregular heartbeat -feeling faint or lightheaded, falls -mouth sores -redness, blistering, peeling or loosening of the skin, including inside the mouth -stomach pain -swelling of the ankles, feet, or hands -trouble passing urine or change in the amount of urine Side effects that usually do not require medical attention (report to your doctor or other health care professional if they continue  or are bothersome): -anxiety -headache -loss of appetite -muscle aches -nausea -night sweats This list may not describe all possible side effects. Call your doctor for medical advice about side effects. You may report side effects to FDA at 1-800-FDA-1088. Where should I keep my medicine? This drug is given in a hospital or clinic and will not be stored at home. NOTE: This sheet is a summary. It may not cover all possible information. If you have questions about this medicine, talk to your doctor, pharmacist, or health care provider.  2012, Elsevier/Gold Standard.  (02/13/2008 2:04:59 PM)

## 2012-12-15 ENCOUNTER — Telehealth: Payer: Self-pay | Admitting: *Deleted

## 2012-12-15 ENCOUNTER — Encounter: Payer: Self-pay | Admitting: Internal Medicine

## 2012-12-15 ENCOUNTER — Encounter: Payer: Self-pay | Admitting: Cardiology

## 2012-12-15 ENCOUNTER — Ambulatory Visit (INDEPENDENT_AMBULATORY_CARE_PROVIDER_SITE_OTHER): Payer: No Typology Code available for payment source | Admitting: Cardiology

## 2012-12-15 VITALS — BP 124/84 | HR 66 | Ht 63.0 in | Wt 144.0 lb

## 2012-12-15 DIAGNOSIS — I455 Other specified heart block: Secondary | ICD-10-CM

## 2012-12-15 DIAGNOSIS — Z950 Presence of cardiac pacemaker: Secondary | ICD-10-CM

## 2012-12-15 DIAGNOSIS — R001 Bradycardia, unspecified: Secondary | ICD-10-CM

## 2012-12-15 DIAGNOSIS — R0609 Other forms of dyspnea: Secondary | ICD-10-CM

## 2012-12-15 DIAGNOSIS — I498 Other specified cardiac arrhythmias: Secondary | ICD-10-CM

## 2012-12-15 DIAGNOSIS — I10 Essential (primary) hypertension: Secondary | ICD-10-CM

## 2012-12-15 DIAGNOSIS — I495 Sick sinus syndrome: Secondary | ICD-10-CM

## 2012-12-15 LAB — PACEMAKER DEVICE OBSERVATION
AL THRESHOLD: 0.5 V
ATRIAL PACING PM: 89.4
BAMS-0001: 154 {beats}/min
RV LEAD THRESHOLD: 1 V
RV LEAD THRESHOLD: 1.5 V
VENTRICULAR PACING PM: 0.7

## 2012-12-15 NOTE — Progress Notes (Signed)
ELECTROPHYSIOLOGY OFFICE NOTE  Patient ID: [name] MRN: [id number], DOB/AGE: [birth year]   Date of Visit: 12/15/2012  Primary Physician: [name], Erica Dach, MD Primary Cardiologist: [name]; previously Dr. Rennis [facility] at [name] Reason for Visit: To establish EP care  History of Present Illness  [name] is a pleasant 78 year old woman with sinus node dysfunction and sinus arrest s/p PPM implant by Dr. Royann Foster last March. She no longer wishes to follow with Dr. Rennis Foster and presents today to establish EP care. She reports DOE x 3-6 months, specifically with walking from her car into work. She denies CP or palpitations. She denies dizziness, near syncope or syncope. She denies LE swelling, orthopnea, PND or recent weight gain. [name] states that she is compliant and tolerating medications without difficulty.  Past Medical History Past Medical History  Diagnosis Date  . Hypothyroidism   . Anemia     pt denies  . Osteoporosis   . Pleural effusion, bilateral     history of  . Pulmonary nodules     history of  . History of pancreatitis     pt denies  . Cystocele     history with rectocele, pt denies  . Scoliosis   . Varicose vein   . Non Hodgkin's lymphoma   . Acute bronchitis 10/09/2012  . Pacemaker 09/15/2011    Medtronic Revo  . Dysrhythmia   . Shortness of breath     with exertion  . GERD (gastroesophageal reflux disease)   . H/O hiatal hernia   . Arthritis   . Cataract     bil  . Hemorrhoid   . Pericarditis   . Atrial fibrillation     Past Surgical History Past Surgical History  Procedure Laterality Date  . Pacemaker insertion  09/15/11    MDT  . Tonsillectomy    . Breast surgery  07/30/01    biopsy x 3, bilateral  . Abdominal hysterectomy  04/29/04    partial  . Mass excision Right 10/28/2012    Procedure: Removal of mass on neck       ;  Surgeon: Ardeth Sportsman, MD;  Location: [facility] OR;  Service: General;  Laterality: Right;      Allergies/Intolerances Allergies  Allergen Reactions  . Amoxicillin Hives    Hives  . Codeine Hives    REACTION: "makes her crazy" Hives  . Demerol Hives  . Morphine And Related Hives  . Vicodin (Hydrocodone-Acetaminophen) Hives  . Sulfonamide Derivatives Rash    REACTION: Rash   Current Home Medications Current Outpatient Prescriptions  Medication Sig Dispense Refill  . acetaminophen (TYLENOL) 500 MG tablet Take 500 mg by mouth every 6 (six) hours as needed for pain.      [name] ALPRAZolam (XANAX) 0.25 MG tablet Take 1 tablet (0.25 mg total) by mouth daily as needed for sleep or anxiety.  30 tablet  1  . aspirin 81 MG tablet Take 81 mg by mouth daily.      [name] denosumab (PROLIA) 60 MG/ML SOLN injection Inject 60 mg into the skin every 6 (six) months. Administer in upper arm, thigh, or abdomen      . hydroxypropyl methylcellulose (ISOPTO TEARS) 2.5 % ophthalmic solution Place 1 drop into both eyes 4 (four) times daily as needed (dry eyes).      [name] KRILL OIL OMEGA-3 PO Take 1 capsule by mouth daily.      [name] levothyroxine (SYNTHROID, LEVOTHROID) 100 MCG tablet Take 100  mcg by mouth daily before breakfast.      . omeprazole (PRILOSEC) 40 MG capsule Take 1 capsule (40 mg total) by mouth 2 (two) times daily.  180 capsule  1  . traMADol (ULTRAM) 50 MG tablet Take 1-2 tablets (50-100 mg total) by mouth every 6 (six) hours as needed for pain.  30 tablet  0  . amLODipine (NORVASC) 10 MG tablet Take 10 mg by mouth every morning. ON HOLD AS OF 12-15-2012       No current facility-administered medications for this visit.   Social History History   Social History  . Marital Status: Widowed    Spouse Name: N/A    Number of Children: 3  . Years of Education: N/A   Occupational History  . self employed    Social History Main Topics  . Smoking status: Never Smoker   . Smokeless tobacco: Never Used  . Alcohol Use: Yes     Comment: 1 glass of wine occas  . Drug Use: No  . Sexually Active: Not on  file   Other Topics Concern  . Not on file   Social History Narrative  . No narrative on file    Review of Systems General: No chills, fever, night sweats or weight changes Cardiovascular: No chest pain, edema, orthopnea, palpitations, paroxysmal nocturnal dyspnea Dermatological: No rash, lesions or masses Respiratory: No cough Urologic: No hematuria, dysuria Abdominal: No nausea, vomiting, diarrhea, bright red blood per rectum, melena, or hematemesis Neurologic: No visual changes, weakness, changes in mental status All other systems reviewed and are otherwise negative except as noted above.  Physical Exam Blood pressure 124/84, pulse 66, height 5\' 3"  (1.6 m), weight 144 lb (65.318 kg).  General: Well developed, well appearing 77 year old female in no acute distress. HEENT: Normocephalic, atraumatic. EOMs intact. Sclera nonicteric. Oropharynx clear.  Neck: Supple. No JVD. Lungs: Respirations regular and unlabored, CTA bilaterally. No wheezes, rales or rhonchi. Heart: RRR. S1, S2 present. No murmurs, rub, S3 or S4. Abdomen: Soft, non-distended.  Extremities: No clubbing, cyanosis or edema. PT/Radials 2+ and equal bilaterally. Psych: Normal affect. Neuro: Alert and oriented X 3. Moves all extremities spontaneously.   Diagnostics Echocardiogram April 2013 Left ventricle: The cavity size was normal. Wall thickness was normal. Systolic function was normal. The estimated ejection fraction was in the range of 55% to 60%. Wall motion was normal; there were no regional wall motion abnormalities. ------------------------------------------------------------ Aortic valve: Structurally normal valve. Trileaflet. Cusp separation was normal. Doppler: Transvalvular velocity was within the normal range. There was no stenosis. No regurgitation. ------------------------------------------------------------ Aorta: Aortic root: The aortic root was normal in size. Ascending aorta: The ascending  aorta was normal in size. ------------------------------------------------------------ Mitral valve: Structurally normal valve. Leaflet separation was normal. ------------------------------------------------------------ Left atrium: The atrium was normal in size. ------------------------------------------------------------ Atrial septum: No defect or patent foramen ovale was identified. ------------------------------------------------------------ Right ventricle: The cavity size was normal. Wall thickness was normal. Pacer wire or catheter noted in right ventricle. Systolic function was normal. ------------------------------------------------------------ Right atrium: The atrium was normal in size. Pacer wire or catheter noted in right atrium. ------------------------------------------------------------ Pericardium: A small, free-flowing pericardial effusion was identified circumferential to the heart. The fluid had no internal echoes.There was no evidence of hemodynamic compromise.  Device interrogation today - Normal device function. Thresholds, sensing, impedances consistent with previous measurements. Device programmed to maximize longevity. No mode switch or high ventricular rates noted. Device programmed at appropriate safety margins. Histogram distribution blunted for patient activity level.  Rate response adjusted accordingly. Otherwise no programming changes made.   Assessment and Plan 1. Dysnea on exertion Histograms are flat Augmented/adjusted rate response to improve heart rate excursion Return for follow-up in 2-3 weeks If no improvement despite improved heart rate excursion, consider stress testing for cardiac risk stratification 2. Sinus node dysfunction with sinus arrest s/p PPM implant March 2013 Normal device function No arrhythmias Rate response augmented/adjusted as outlined above otherwise no programming changes made 3. HTN Stable; normotensive  currently Continue current regimen  The above plan of care was formulated with Dr. Berton Mount who was in to see the patient. Signed, Rick Duff, PA-C 12/15/2012, 5:31 PM

## 2012-12-15 NOTE — Telephone Encounter (Signed)
NO PROBLEMS OR QUESTIONS AT THIS TIME. INSTRUCTED PT. TO USE CHEMOTHERAPY INFORMATION SHEET AS A REFERENCE. SHE WILL CALL THIS OFFICE OR THE ON CALL PHYSICIAN IF THE NEED ARISES.

## 2012-12-15 NOTE — Patient Instructions (Addendum)
Your physician wants you to follow-up in: 6 months for DEVICE CHECK You will receive a reminder letter in the mail two months in advance. If you don't receive a letter, please call our office to schedule the follow-up appointment.  Your physician recommends that you schedule a follow-up appointment in: 01-10-2013 WITH [facility]  Your physician recommends that you continue on your current medications as directed. Please refer to the Current Medication list given to you today.

## 2012-12-20 ENCOUNTER — Institutional Professional Consult (permissible substitution): Payer: No Typology Code available for payment source | Admitting: Cardiovascular Disease

## 2012-12-26 ENCOUNTER — Other Ambulatory Visit: Payer: No Typology Code available for payment source | Admitting: Lab

## 2013-01-02 ENCOUNTER — Other Ambulatory Visit: Payer: No Typology Code available for payment source | Admitting: Lab

## 2013-01-02 ENCOUNTER — Ambulatory Visit: Payer: No Typology Code available for payment source | Admitting: Internal Medicine

## 2013-01-05 ENCOUNTER — Other Ambulatory Visit: Payer: Self-pay | Admitting: Family Medicine

## 2013-01-05 NOTE — Telephone Encounter (Signed)
Rx request to pharmacy/SLS  

## 2013-01-10 ENCOUNTER — Encounter: Payer: Self-pay | Admitting: Cardiology

## 2013-01-10 ENCOUNTER — Ambulatory Visit (INDEPENDENT_AMBULATORY_CARE_PROVIDER_SITE_OTHER): Payer: No Typology Code available for payment source | Admitting: Cardiology

## 2013-01-10 VITALS — BP 124/88 | HR 76 | Ht 63.0 in | Wt 140.0 lb

## 2013-01-10 DIAGNOSIS — I495 Sick sinus syndrome: Secondary | ICD-10-CM

## 2013-01-10 DIAGNOSIS — I10 Essential (primary) hypertension: Secondary | ICD-10-CM

## 2013-01-10 DIAGNOSIS — R0989 Other specified symptoms and signs involving the circulatory and respiratory systems: Secondary | ICD-10-CM

## 2013-01-10 DIAGNOSIS — I455 Other specified heart block: Secondary | ICD-10-CM

## 2013-01-10 DIAGNOSIS — Z950 Presence of cardiac pacemaker: Secondary | ICD-10-CM

## 2013-01-10 NOTE — Progress Notes (Signed)
ELECTROPHYSIOLOGY OFFICE NOTE  Patient ID: [name] MRN: [id number], DOB/AGE: Aug 14, 1934   Date of Visit: 01/10/2013  Primary Physician: Letitia Libra, Ala Dach, MD Primary Cardiologist: New to [facility]; previously Dr. Rennis Golden at [facility] Reason for Visit: EP follow-up  History of Present Illness  [name] is a pleasant 78 year old woman with sinus node dysfunction and sinus arrest s/p PPM implant by Dr. Royann Shivers last March. She no longer wishes to follow with Dr. Rennis Golden and presented on 12/15/2012 to establish EP care. At that visit she reported DOE x 3-6 months, specifically with walking from her car into work. She denies any other symptoms. On device interrogation, her histograms were flat / blunted; therefore, rate response was turned on. She presents today for follow-up and reports her symptoms have resolved. She is feeling much better. She denies CP, SOB or palpitations. She denies dizziness, near syncope or syncope. She denies LE swelling, orthopnea, PND or recent weight gain. Ms. Philyaw states that she is compliant and tolerating medications without difficulty.  Past Medical History Past Medical History  Diagnosis Date  . Hypothyroidism   . Anemia     pt denies  . Osteoporosis   . Pleural effusion, bilateral     history of  . Pulmonary nodules     history of  . History of pancreatitis     pt denies  . Cystocele     history with rectocele, pt denies  . Scoliosis   . Varicose vein   . Non Hodgkin's lymphoma   . Acute bronchitis 10/09/2012  . Pacemaker 09/15/2011    Medtronic Revo  . Dysrhythmia   . Shortness of breath     with exertion  . GERD (gastroesophageal reflux disease)   . H/O hiatal hernia   . Arthritis   . Cataract     bil  . Hemorrhoid   . Pericarditis   . Atrial fibrillation     Past Surgical History Past Surgical History  Procedure Laterality Date  . Pacemaker insertion  09/15/11    MDT  . Tonsillectomy    . Breast surgery  07/30/01   biopsy x 3, bilateral  . Abdominal hysterectomy  04/29/04    partial  . Mass excision Right 10/28/2012    Procedure: Removal of mass on neck       ;  Surgeon: Ardeth Sportsman, MD;  Location: [facility] OR;  Service: General;  Laterality: Right;    Allergies/Intolerances Allergies  Allergen Reactions  . Amoxicillin Hives    Hives  . Codeine Hives    REACTION: "makes her crazy" Hives  . Demerol Hives  . Morphine And Related Hives  . Vicodin (Hydrocodone-Acetaminophen) Hives  . Sulfonamide Derivatives Rash    REACTION: Rash   Current Home Medications Current Outpatient Prescriptions  Medication Sig Dispense Refill  . acetaminophen (TYLENOL) 500 MG tablet Take 500 mg by mouth every 6 (six) hours as needed for pain.      [name] ALPRAZolam (XANAX) 0.25 MG tablet Take 1 tablet (0.25 mg total) by mouth daily as needed for sleep or anxiety.  30 tablet  1  . amLODipine (NORVASC) 10 MG tablet Take 10 mg by mouth every morning. ON HOLD AS OF 12-15-2012      . aspirin 81 MG tablet Take 81 mg by mouth daily.      [name] denosumab (PROLIA) 60 MG/ML SOLN injection Inject 60 mg into the skin every 6 (six) months. Administer in upper arm, thigh, or  abdomen      . hydroxypropyl methylcellulose (ISOPTO TEARS) 2.5 % ophthalmic solution Place 1 drop into both eyes 4 (four) times daily as needed (dry eyes).      [name] KRILL OIL OMEGA-3 PO Take 1 capsule by mouth daily.      [name] levothyroxine (SYNTHROID, LEVOTHROID) 100 MCG tablet take 1 tablet by mouth once daily  90 tablet  0  . omeprazole (PRILOSEC) 40 MG capsule Take 1 capsule (40 mg total) by mouth 2 (two) times daily.  180 capsule  1  . traMADol (ULTRAM) 50 MG tablet Take 1-2 tablets (50-100 mg total) by mouth every 6 (six) hours as needed for pain.  30 tablet  0   No current facility-administered medications for this visit.   Social History History   Social History  . Marital Status: Widowed    Spouse Name: N/A    Number of Children: 3  . Years of Education: N/A    Occupational History  . self employed    Social History Main Topics  . Smoking status: Never Smoker   . Smokeless tobacco: Never Used  . Alcohol Use: Yes     Comment: 1 glass of wine occas  . Drug Use: No  . Sexually Active: Not on file   Other Topics Concern  . Not on file   Social History Narrative  . No narrative on file    Review of Systems General: No chills, fever, night sweats or weight changes Cardiovascular: No chest pain, dyspnea on exertion, edema, orthopnea, palpitations, paroxysmal nocturnal dyspnea Dermatological: No rash, lesions or masses Respiratory: No cough, dyspnea Urologic: No hematuria, dysuria Abdominal: No nausea, vomiting, diarrhea, bright red blood per rectum, melena, or hematemesis Neurologic: No visual changes, weakness, changes in mental status All other systems reviewed and are otherwise negative except as noted above.  Physical Exam Vitals: Blood pressure 124/88, pulse 76, height 5\' 3"  (1.6 m), weight 140 lb (63.504 kg), SpO2 98.00%.  General: Well developed, well appearing 78 y.o. female in no acute distress. HEENT: Normocephalic, atraumatic. EOMs intact. Sclera nonicteric. Oropharynx clear.  Neck: Supple. No JVD. Lungs: Respirations regular and unlabored, CTA bilaterally. No wheezes, rales or rhonchi. Heart: RRR. S1, S2 present. No murmurs, rub, S3 or S4. Abdomen: Soft, non-distended.  Extremities: No clubbing, cyanosis or edema. PT/Radials 2+ and equal bilaterally. Psych: Normal affect. Neuro: Alert and oriented X 3. Moves all extremities spontaneously.    Assessment and Plan 1. Dysnea on exertion  Resolved with better rate response / improved heart rate excursion  2. Sinus node dysfunction with sinus arrest s/p PPM implant March 2013  Normal device function by interrogation 3 weeks ago  No arrhythmias  Return for follow-up in 6 months for routine PPM check 3. HTN  Stable; normotensive currently  Continue current regimen    Signed, Tamsin Nader, PA-C 01/10/2013, 9:40 PM

## 2013-01-10 NOTE — Patient Instructions (Signed)
Your physician wants you to follow-up in: 6 Months with Dr. Logan Bores will receive a reminder letter in the mail two months in advance. If you don't receive a letter, please call our office to schedule the follow-up appointment.  Your physician recommends that you continue on your current medications as directed. Please refer to the Current Medication list given to you today.

## 2013-02-08 ENCOUNTER — Ambulatory Visit (HOSPITAL_BASED_OUTPATIENT_CLINIC_OR_DEPARTMENT_OTHER): Payer: No Typology Code available for payment source

## 2013-02-08 ENCOUNTER — Encounter: Payer: Self-pay | Admitting: Internal Medicine

## 2013-02-08 ENCOUNTER — Ambulatory Visit (HOSPITAL_BASED_OUTPATIENT_CLINIC_OR_DEPARTMENT_OTHER): Payer: No Typology Code available for payment source | Admitting: Internal Medicine

## 2013-02-08 ENCOUNTER — Telehealth: Payer: Self-pay | Admitting: Internal Medicine

## 2013-02-08 ENCOUNTER — Other Ambulatory Visit (HOSPITAL_BASED_OUTPATIENT_CLINIC_OR_DEPARTMENT_OTHER): Payer: No Typology Code available for payment source | Admitting: Lab

## 2013-02-08 ENCOUNTER — Other Ambulatory Visit: Payer: No Typology Code available for payment source | Admitting: Lab

## 2013-02-08 VITALS — BP 120/67 | HR 63 | Temp 97.1°F | Resp 18

## 2013-02-08 VITALS — BP 126/71 | HR 61 | Temp 97.5°F | Resp 18 | Ht 63.0 in | Wt 140.0 lb

## 2013-02-08 DIAGNOSIS — C8291 Follicular lymphoma, unspecified, lymph nodes of head, face, and neck: Secondary | ICD-10-CM

## 2013-02-08 DIAGNOSIS — C8590 Non-Hodgkin lymphoma, unspecified, unspecified site: Secondary | ICD-10-CM

## 2013-02-08 DIAGNOSIS — C8290 Follicular lymphoma, unspecified, unspecified site: Secondary | ICD-10-CM

## 2013-02-08 DIAGNOSIS — Z5112 Encounter for antineoplastic immunotherapy: Secondary | ICD-10-CM

## 2013-02-08 LAB — COMPREHENSIVE METABOLIC PANEL (CC13)
ALT: 17 U/L (ref 0–55)
AST: 28 U/L (ref 5–34)
Albumin: 4.1 g/dL (ref 3.5–5.0)
Alkaline Phosphatase: 98 U/L (ref 40–150)
CO2: 24 mEq/L (ref 22–29)
Calcium: 10.4 mg/dL (ref 8.4–10.4)
Chloride: 106 mEq/L (ref 98–109)
Creatinine: 0.8 mg/dL (ref 0.6–1.1)
Glucose: 80 mg/dl (ref 70–140)
Potassium: 4.1 mEq/L (ref 3.5–5.1)
Sodium: 142 mEq/L (ref 136–145)
Total Protein: 8.2 g/dL (ref 6.4–8.3)

## 2013-02-08 LAB — CBC WITH DIFFERENTIAL/PLATELET
BASO%: 1.6 % (ref 0.0–2.0)
EOS%: 6.7 % (ref 0.0–7.0)
MCH: 27.7 pg (ref 25.1–34.0)
MCHC: 32.2 g/dL (ref 31.5–36.0)
RBC: 4.62 10*6/uL (ref 3.70–5.45)
RDW: 17.1 % — ABNORMAL HIGH (ref 11.2–14.5)
lymph#: 1.9 10*3/uL (ref 0.9–3.3)
nRBC: 0 % (ref 0–0)

## 2013-02-08 LAB — LACTATE DEHYDROGENASE (CC13): LDH: 295 U/L — ABNORMAL HIGH (ref 125–245)

## 2013-02-08 MED ORDER — DIPHENHYDRAMINE HCL 25 MG PO CAPS
50.0000 mg | ORAL_CAPSULE | Freq: Once | ORAL | Status: AC
  Administered 2013-02-08: 50 mg via ORAL

## 2013-02-08 MED ORDER — SODIUM CHLORIDE 0.9 % IV SOLN
Freq: Once | INTRAVENOUS | Status: AC
  Administered 2013-02-08: 11:00:00 via INTRAVENOUS

## 2013-02-08 MED ORDER — SODIUM CHLORIDE 0.9 % IV SOLN
375.0000 mg/m2 | Freq: Once | INTRAVENOUS | Status: AC
  Administered 2013-02-08: 600 mg via INTRAVENOUS
  Filled 2013-02-08: qty 60

## 2013-02-08 MED ORDER — ACETAMINOPHEN 325 MG PO TABS
650.0000 mg | ORAL_TABLET | Freq: Once | ORAL | Status: AC
  Administered 2013-02-08: 650 mg via ORAL

## 2013-02-08 NOTE — Patient Instructions (Signed)
[facility] Discharge Instructions for Patients Receiving Chemotherapy  Today you received the following chemotherapy agents Rituxan.  To help prevent nausea and vomiting after your treatment, we encourage you to take your nausea medication as prescribed.   If you develop nausea and vomiting that is not controlled by your nausea medication, call the clinic.   BELOW ARE SYMPTOMS THAT SHOULD BE REPORTED IMMEDIATELY:  *FEVER GREATER THAN 100.5 F  *CHILLS WITH OR WITHOUT FEVER  NAUSEA AND VOMITING THAT IS NOT CONTROLLED WITH YOUR NAUSEA MEDICATION  *UNUSUAL SHORTNESS OF BREATH  *UNUSUAL BRUISING OR BLEEDING  TENDERNESS IN MOUTH AND THROAT WITH OR WITHOUT PRESENCE OF ULCERS  *URINARY PROBLEMS  *BOWEL PROBLEMS  UNUSUAL RASH Items with * indicate a potential emergency and should be followed up as soon as possible.  Feel free to call the clinic you have any questions or concerns. The clinic phone number is [phone].    

## 2013-02-08 NOTE — Progress Notes (Signed)
[facility] Telephone:([phone]) [phone]   Fax:([phone]) [phone]  OFFICE PROGRESS NOTE  Erica Cotta, MD [address] [place] [address]  DIAGNOSIS: recurrent Non Hodgkin's lymphoma, follicular center cell type with a predominant follicular pattern.Grade (if applicable): Favor high grade (grade 3/3), initially diagnosed in November of 2012   PRIOR THERAPY:  1) Status post treatment with Rituxan weekly for 3 doses in addition to 3 tablets of Afinitor at [facility] in [place].  2) Status post surgical excision of the right neck mass under the care of Dr. Michaell Cowing on 10/28/2012.   CURRENT THERAPY: Maintenance Rituxan 375 mg/M2 every 2 months status post 1 cycle of his dose was given on 12/14/2012.   INTERVAL HISTORY: [name] 78 y.o. female returns to the clinic today for followup visit accompanied by her daughter. The patient is feeling fine today with no specific complaints. She denied having any significant chest pain, shortness breath, cough or hemoptysis. She has no weight loss but occasional night sweats. The patient denied having any significant nausea or vomiting. She has no fever or chills. She tolerated the first dose of maintenance Rituxan fairly well with no significant adverse effects.   MEDICAL HISTORY: Past Medical History  Diagnosis Date  . Hypothyroidism   . Anemia     pt denies  . Osteoporosis   . Pleural effusion, bilateral     history of  . Pulmonary nodules     history of  . History of pancreatitis     pt denies  . Cystocele     history with rectocele, pt denies  . Scoliosis   . Varicose vein   . Non Hodgkin's lymphoma   . Acute bronchitis 10/09/2012  . Pacemaker 09/15/2011    Medtronic Revo  . Dysrhythmia   . Shortness of breath     with exertion  . GERD (gastroesophageal reflux disease)   . H/O hiatal hernia   . Arthritis   . Cataract     bil  . Hemorrhoid   . Pericarditis   . Atrial  fibrillation     ALLERGIES:  is allergic to amoxicillin; codeine; demerol; morphine and related; vicodin; and sulfonamide derivatives.  MEDICATIONS:  Current Outpatient Prescriptions  Medication Sig Dispense Refill  . acetaminophen (TYLENOL) 500 MG tablet Take 500 mg by mouth every 6 (six) hours as needed for pain.      [name] ALPRAZolam (XANAX) 0.25 MG tablet Take 1 tablet (0.25 mg total) by mouth daily as needed for sleep or anxiety.  30 tablet  1  . amLODipine (NORVASC) 10 MG tablet Take 10 mg by mouth every morning. ON HOLD AS OF 12-15-2012      . aspirin 81 MG tablet Take 81 mg by mouth daily.      [name] denosumab (PROLIA) 60 MG/ML SOLN injection Inject 60 mg into the skin every 6 (six) months. Administer in upper arm, thigh, or abdomen      . hydroxypropyl methylcellulose (ISOPTO TEARS) 2.5 % ophthalmic solution Place 1 drop into both eyes 4 (four) times daily as needed (dry eyes).      [name] KRILL OIL OMEGA-3 PO Take 1 capsule by mouth daily.      [name] levothyroxine (SYNTHROID, LEVOTHROID) 100 MCG tablet take 1 tablet by mouth once daily  90 tablet  0  . omeprazole (PRILOSEC) 40 MG capsule Take 1 capsule (40 mg total) by mouth 2 (two) times daily.  180 capsule  1  . traMADol (ULTRAM) 50 MG tablet Take 1-2 tablets (50-100 mg total) by mouth every 6 (six) hours as needed for pain.  30 tablet  0   No current facility-administered medications for this visit.    SURGICAL HISTORY:  Past Surgical History  Procedure Laterality Date  . Pacemaker insertion  09/15/11    MDT  . Tonsillectomy    . Breast surgery  07/30/01    biopsy x 3, bilateral  . Abdominal hysterectomy  04/29/04    partial  . Mass excision Right 10/28/2012    Procedure: Removal of mass on neck       ;  Surgeon: Ardeth Sportsman, MD;  Location: [facility] OR;  Service: General;  Laterality: Right;    REVIEW OF SYSTEMS:  A comprehensive review of systems was negative.   PHYSICAL EXAMINATION: General appearance: alert, cooperative and no  distress Head: Normocephalic, without obvious abnormality, atraumatic Neck: no adenopathy Lymph nodes: Cervical, supraclavicular, and axillary nodes normal. Resp: clear to auscultation bilaterally Cardio: regular rate and rhythm, S1, S2 normal, no murmur, click, rub or gallop GI: soft, non-tender; bowel sounds normal; no masses,  no organomegaly Extremities: extremities normal, atraumatic, no cyanosis or edema  ECOG PERFORMANCE STATUS: 1 - Symptomatic but completely ambulatory  Blood pressure 126/71, pulse 61, temperature 97.5 F (36.4 C), temperature source Oral, resp. rate 18, height 5\' 3"  (1.6 m), weight 140 lb (63.504 kg).  LABORATORY DATA: Lab Results  Component Value Date   WBC 7.9 02/08/2013   HGB 12.8 02/08/2013   HCT 39.7 02/08/2013   MCV 85.9 02/08/2013   PLT 342 02/08/2013      Chemistry      Component Value Date/Time   NA 142 12/14/2012 1009   NA 140 12/06/2012 1649   K 3.9 12/14/2012 1009   K 3.9 12/06/2012 1649   CL 109* 12/14/2012 1009   CL 108 12/06/2012 1649   CO2 22 12/14/2012 1009   CO2 27 12/06/2012 1649   BUN 17.3 12/14/2012 1009   BUN 16 12/06/2012 1649   CREATININE 0.8 12/14/2012 1009   CREATININE 0.8 12/06/2012 1649   CREATININE 0.75 10/06/2012 1202      Component Value Date/Time   CALCIUM 9.5 12/14/2012 1009   CALCIUM 9.1 12/06/2012 1649   ALKPHOS 78 12/14/2012 1009   ALKPHOS 77 10/06/2012 1202   AST 22 12/14/2012 1009   AST 28 10/06/2012 1202   ALT 14 12/14/2012 1009   ALT 19 10/06/2012 1202   BILITOT 0.27 12/14/2012 1009   BILITOT 0.3 10/06/2012 1202       RADIOGRAPHIC STUDIES: No results found.  ASSESSMENT AND PLAN: This is a very pleasant 78 years old white female with recurrent follicular lymphoma currently on maintenance Rituxan status post 1 cycle. She tolerated the first cycle of her treatment fairly well. I recommended for the patient to proceed with cycle #2 today as scheduled. The patient would come back for followup visit in 2 months with the next  cycle of her treatment. She was advised to call immediately if she has any concerning symptoms in the interval.  The patient voices understanding of current disease status and treatment options and is in agreement with the current care plan.  All questions were answered. The patient knows to call the clinic with any problems, questions or concerns. We can certainly see the patient much sooner if necessary.

## 2013-02-08 NOTE — Patient Instructions (Signed)
Continue maintenance Rituxan. Followup visit in 2 months

## 2013-02-08 NOTE — Telephone Encounter (Signed)
Gave pt appt for lab,Md and chemo for October 2014

## 2013-02-09 ENCOUNTER — Encounter: Payer: Self-pay | Admitting: Cardiovascular Disease

## 2013-02-09 ENCOUNTER — Ambulatory Visit (INDEPENDENT_AMBULATORY_CARE_PROVIDER_SITE_OTHER): Payer: No Typology Code available for payment source | Admitting: Cardiovascular Disease

## 2013-02-09 VITALS — BP 130/82 | HR 63 | Resp 12 | Ht 65.0 in | Wt 140.6 lb

## 2013-02-09 DIAGNOSIS — I10 Essential (primary) hypertension: Secondary | ICD-10-CM

## 2013-02-09 DIAGNOSIS — Z950 Presence of cardiac pacemaker: Secondary | ICD-10-CM

## 2013-02-09 DIAGNOSIS — I498 Other specified cardiac arrhythmias: Secondary | ICD-10-CM

## 2013-02-09 DIAGNOSIS — R001 Bradycardia, unspecified: Secondary | ICD-10-CM

## 2013-02-09 DIAGNOSIS — E785 Hyperlipidemia, unspecified: Secondary | ICD-10-CM

## 2013-02-09 LAB — PACEMAKER DEVICE OBSERVATION
AL IMPEDENCE PM: 360 Ohm
BAMS-0001: 154 {beats}/min
RV LEAD AMPLITUDE: 15.4 mv
RV LEAD IMPEDENCE PM: 640 Ohm
RV LEAD THRESHOLD: 1 V

## 2013-02-09 NOTE — Progress Notes (Signed)
In office pacemaker interrogation. Normal device function. No changes made this session. 

## 2013-02-09 NOTE — Patient Instructions (Addendum)
Your physician recommends that you schedule a follow-up appointment in: 3 months for a pacemaker interrogation, and in 12 months for a physician check

## 2013-02-10 ENCOUNTER — Telehealth: Payer: Self-pay | Admitting: Internal Medicine

## 2013-02-10 NOTE — Telephone Encounter (Signed)
Talked to pt and she is aware of appt for October and beyond, lab, MD and chermo

## 2013-02-11 ENCOUNTER — Other Ambulatory Visit: Payer: Self-pay | Admitting: Family Medicine

## 2013-02-11 ENCOUNTER — Encounter: Payer: Self-pay | Admitting: Cardiovascular Disease

## 2013-02-11 DIAGNOSIS — Z950 Presence of cardiac pacemaker: Secondary | ICD-10-CM | POA: Insufficient documentation

## 2013-02-11 NOTE — Progress Notes (Signed)
Patient ID: [name], female   DOB: December 26, 1934, 78 y.o.   MRN: [id number]     Reason for office visit Sinus node dysfunction, previous atrial fibrillation, pacemaker followup  [name] is a 78 year old woman with symptomatic sinus node dysfunction/bradycardia for which a pacemaker was implanted roughly 18 months ago. She developed post procedural acute pericarditis and paroxysmal atrial fibrillation. She did not require pericardiocentesis. The symptoms subsided with anti-inflammatory therapy and the atrial fibrillation resolved. It has not recurred in over a year. She was initially treated with antiarrhythmics but these have been successfully discontinued about a problem.  She has a history of non-Hodgkin's lymphoma and felt that she was "cured" but had to recently undergo a lymphadenectomy for cervical lymph node that proves to have evidence of lymphoma. On the other hand her workup has not shown any evidence of malignancy elsewhere and she is not receiving aggressive chemotherapy.   She complains of fatigue but denies dyspnea, palpitations, syncope or chest pain. Interrogation of her pacemaker shows good heart rate distribution with activity. There've been no arrhythmias.    Allergies  Allergen Reactions  . Amoxicillin Hives    Hives  . Codeine Hives    REACTION: "makes her crazy" Hives  . Demerol Hives  . Morphine And Related Hives  . Vicodin [Hydrocodone-Acetaminophen] Hives  . Sulfonamide Derivatives Rash    REACTION: Rash    Current Outpatient Prescriptions  Medication Sig Dispense Refill  . acetaminophen (TYLENOL) 500 MG tablet Take 500 mg by mouth every 6 (six) hours as needed for pain.      [name] ALPRAZolam (XANAX) 0.25 MG tablet Take 1 tablet (0.25 mg total) by mouth daily as needed for sleep or anxiety.  30 tablet  1  . amLODipine (NORVASC) 10 MG tablet Take 10 mg by mouth every morning.       [name] aspirin 81 MG tablet Take 81 mg by mouth daily.      [name] denosumab  (PROLIA) 60 MG/ML SOLN injection Inject 60 mg into the skin every 6 (six) months. Administer in upper arm, thigh, or abdomen      . KRILL OIL OMEGA-3 PO Take 1 capsule by mouth daily.      [name] levothyroxine (SYNTHROID, LEVOTHROID) 100 MCG tablet take 1 tablet by mouth once daily  90 tablet  0  . omeprazole (PRILOSEC) 40 MG capsule Take 1 capsule (40 mg total) by mouth 2 (two) times daily.  180 capsule  1   No current facility-administered medications for this visit.    Past Medical History  Diagnosis Date  . Hypothyroidism   . Anemia     pt denies  . Osteoporosis   . Pleural effusion, bilateral     history of  . Pulmonary nodules     history of  . History of pancreatitis     pt denies  . Cystocele     history with rectocele, pt denies  . Scoliosis   . Varicose vein   . Non Hodgkin's lymphoma   . Acute bronchitis 10/09/2012  . Pacemaker 09/15/2011    Medtronic Revo  . Dysrhythmia   . Shortness of breath     with exertion  . GERD (gastroesophageal reflux disease)   . H/O hiatal hernia   . Arthritis   . Cataract     bil  . Hemorrhoid   . Pericarditis   . Atrial fibrillation     Past Surgical History  Procedure Laterality Date  . Pacemaker insertion  09/15/11    MDT  . Tonsillectomy    . Breast surgery  07/30/01    biopsy x 3, bilateral  . Abdominal hysterectomy  04/29/04    partial  . Mass excision Right 10/28/2012    Procedure: Removal of mass on neck       ;  Surgeon: Ardeth Sportsman, MD;  Location: [facility] OR;  Service: General;  Laterality: Right;    Family History  Problem Relation Age of Onset  . Heart disease [name]     congenital "whole in her heart"  . Stroke Mother   . Lymphoma [name]     nonhodgkins  . Stroke Father     History   Social History  . Marital Status: Widowed    Spouse Name: N/A    Number of Children: 3  . Years of Education: N/A   Occupational History  . self employed    Social History Main Topics  . Smoking status: Never Smoker     . Smokeless tobacco: Never Used  . Alcohol Use: Yes     Comment: 1 glass of wine occasionally  . Drug Use: No  . Sexual Activity: Not on file   Other Topics Concern  . Not on file   Social History Narrative  . No narrative on file    Review of systems: The patient specifically denies any chest pain at rest or with exertion, dyspnea at rest or with exertion, orthopnea, paroxysmal nocturnal dyspnea, syncope, palpitations, focal neurological deficits, intermittent claudication, lower extremity edema, unexplained weight gain, cough, hemoptysis or wheezing.  The patient also denies abdominal pain, nausea, vomiting, dysphagia, diarrhea, constipation, polyuria, polydipsia, dysuria, hematuria, frequency, urgency, abnormal bleeding or bruising, fever, chills, unexpected weight changes, mood swings, change in skin or hair texture, change in voice quality, auditory or visual problems, allergic reactions or rashes, new musculoskeletal complaints other than usual "aches and pains".   PHYSICAL EXAM BP 130/82  Pulse 63  Resp 12  Ht 5\' 5"  (1.651 m)  Wt 140 lb 9.6 oz (63.776 kg)  BMI 23.4 kg/m2  General: Alert, oriented x3, no distress Head: no evidence of trauma, PERRL, EOMI, no exophtalmos or lid lag, no myxedema, no xanthelasma; normal ears, nose and oropharynx Neck: normal jugular venous pulsations and no hepatojugular reflux; brisk carotid pulses without delay and no carotid bruits Chest: clear to auscultation, no signs of consolidation by percussion or palpation, normal fremitus, symmetrical and full respiratory excursions, healthy left subclavian pacemaker site Cardiovascular: normal position and quality of the apical impulse, regular rhythm, normal first and second heart sounds, no murmurs, rubs or gallops Abdomen: no tenderness or distention, no masses by palpation, no abnormal pulsatility or arterial bruits, normal bowel sounds, no hepatosplenomegaly Extremities: no clubbing, cyanosis or  edema; 2+ radial, ulnar and brachial pulses bilaterally; 2+ right femoral, posterior tibial and dorsalis pedis pulses; 2+ left femoral, posterior tibial and dorsalis pedis pulses; no subclavian or femoral bruits Neurological: grossly nonfocal   EKG: Paced ventricular sensed rhythm otherwise normal  Lipid Panel     Component Value Date/Time   CHOL 238* 10/06/2012 1202   TRIG 199* 10/06/2012 1202   HDL 53 10/06/2012 1202   CHOLHDL 4.5 10/06/2012 1202   VLDL 40 10/06/2012 1202   LDLCALC 145* 10/06/2012 1202    BMET    Component Value Date/Time   NA 142 02/08/2013 0953   NA 140 12/06/2012 1649   K 4.1 02/08/2013 0953   K 3.9 12/06/2012 1649   CL  109* 12/14/2012 1009   CL 108 12/06/2012 1649   CO2 24 02/08/2013 0953   CO2 27 12/06/2012 1649   GLUCOSE 80 02/08/2013 0953   GLUCOSE 64* 12/14/2012 1009   GLUCOSE 66* 12/06/2012 1649   BUN 16.8 02/08/2013 0953   BUN 16 12/06/2012 1649   CREATININE 0.8 02/08/2013 0953   CREATININE 0.8 12/06/2012 1649   CREATININE 0.75 10/06/2012 1202   CALCIUM 10.4 02/08/2013 0953   CALCIUM 9.1 12/06/2012 1649   GFRNONAA 66* 10/25/2012 1036   GFRAA 76* 10/25/2012 1036     ASSESSMENT AND PLAN Pacemaker Medtronic revo MRI conditional dual-chamber device implanted March 2013; complicated by postoperative pericarditis and paroxysmal atrial fibrillation, resolved. No evidence of significant atrial arrhythmia in the last year. No episodes of ventricular arrhythmia seen either. Roughly 91% atrial pacing, less than 1% ventricular pacing. Her heart rate histogram distribution is fair suggesting that her dyspnea is not related to chronotropic incompetence. All lead parameters are within the desirable range. Please see separate pacemaker check.  Dyslipidemia, LDL 136 Her total cholesterol and LDL cholesterol slightly elevated. She does not have known coronary or other vascular problems. She has lipid parameters are in the range for lifestyle modification and I do not think  pharmacological therapy is indicated.   Orders Placed This Encounter  Procedures  . Pacemaker Device Observation   No orders of the defined types were placed in this encounter.    Junious Silk, MD, [facility] [phone] office [phone] pager

## 2013-02-11 NOTE — Assessment & Plan Note (Signed)
Her total cholesterol and LDL cholesterol slightly elevated. She does not have known coronary or other vascular problems. She has lipid parameters are in the range for lifestyle modification and I do not think pharmacological therapy is indicated.

## 2013-02-11 NOTE — Assessment & Plan Note (Signed)
Medtronic revo MRI conditional dual-chamber device implanted March 2013; complicated by postoperative pericarditis and paroxysmal atrial fibrillation, resolved. No evidence of significant atrial arrhythmia in the last year. No episodes of ventricular arrhythmia seen either. Roughly 91% atrial pacing, less than 1% ventricular pacing. Her heart rate histogram distribution is fair suggesting that her dyspnea is not related to chronotropic incompetence. All lead parameters are within the desirable range. Please see separate pacemaker check.

## 2013-02-13 NOTE — Telephone Encounter (Signed)
Alprazolam rx last sent 11/23/12 # 30 x 1 refill.  Please advise.

## 2013-02-14 ENCOUNTER — Other Ambulatory Visit (HOSPITAL_COMMUNITY): Payer: Self-pay | Admitting: Family Medicine

## 2013-02-14 MED ORDER — TRAMADOL HCL 50 MG PO TABS: ORAL_TABLET | ORAL | Status: DC

## 2013-02-14 NOTE — Telephone Encounter (Signed)
Informed patient of medication refill and she scheduled appointment for next week with [facility]. Could not get her in to see Dr. Abner Greenspan for a few weeks.

## 2013-02-14 NOTE — Telephone Encounter (Signed)
Please advise Tramadol refill? Dr Abner Greenspan wanted pt to return around 01-05-13 (appt was cancelled). Last RX was wrote 10-28-12 quantity 30 with 0 refills  If ok fax to [phone]

## 2013-02-14 NOTE — Telephone Encounter (Signed)
RX faxed

## 2013-02-14 NOTE — Telephone Encounter (Signed)
Rx printed for #15 tabs. Will need OV prior to additional refills.

## 2013-02-14 NOTE — Telephone Encounter (Signed)
Judeth Cornfield will you call and schedule appt

## 2013-02-21 ENCOUNTER — Ambulatory Visit (INDEPENDENT_AMBULATORY_CARE_PROVIDER_SITE_OTHER): Payer: No Typology Code available for payment source | Admitting: Family

## 2013-02-21 ENCOUNTER — Encounter: Payer: Self-pay | Admitting: Family

## 2013-02-21 DIAGNOSIS — E039 Hypothyroidism, unspecified: Secondary | ICD-10-CM

## 2013-02-21 DIAGNOSIS — M25519 Pain in unspecified shoulder: Secondary | ICD-10-CM

## 2013-02-21 DIAGNOSIS — C8589 Other specified types of non-Hodgkin lymphoma, extranodal and solid organ sites: Secondary | ICD-10-CM

## 2013-02-21 DIAGNOSIS — E042 Nontoxic multinodular goiter: Secondary | ICD-10-CM

## 2013-02-21 DIAGNOSIS — I10 Essential (primary) hypertension: Secondary | ICD-10-CM

## 2013-02-21 DIAGNOSIS — M25511 Pain in right shoulder: Secondary | ICD-10-CM | POA: Insufficient documentation

## 2013-02-21 DIAGNOSIS — C8590 Non-Hodgkin lymphoma, unspecified, unspecified site: Secondary | ICD-10-CM

## 2013-02-21 DIAGNOSIS — M24819 Other specific joint derangements of unspecified shoulder, not elsewhere classified: Secondary | ICD-10-CM

## 2013-02-21 MED ORDER — GABAPENTIN 100 MG PO CAPS: 100.0000 mg | ORAL_CAPSULE | Freq: Three times a day (TID) | ORAL | Status: DC

## 2013-02-21 NOTE — Assessment & Plan Note (Signed)
Suspect that she experienced some nerve injury due to lymph node excision. Will give trial of gabapentin for pain and refer to neurology for further evaluation.

## 2013-02-21 NOTE — Progress Notes (Signed)
Subjective:    Patient ID: [name], female    DOB: 03-15-1935, 78 y.o.   MRN: [id number]  HPI  [name] is a 78 yr old female who presents today for follow up of multiple medical problems:  1) HTN- She continues amlodipine. Reports occasional swelling in feet. Denies CP/SOB.   2) Hypothyroid- she is continuing synthroid .  Feels well on this dose.   3) shoulder pain- she reports pain in the right shoulder.  Started with numbness on the right side of her face following her lymph node removal.  She has had right shoulder pain/weakness since that time.  Reports shoulder pain can be as high as 9/10 at times.   4) multinodular goiter- She has seen Dr. Allena Katz in the past.  She reports that insurance will not cover him.   5) NHL- diagnosed last may and being treated with chemo under. Dr Gwenyth Bouillon. She is currently on Rituxen. Review of Systems    see hpi  Past Medical History  Diagnosis Date  . Hypothyroidism   . Anemia     pt denies  . Osteoporosis   . Pleural effusion, bilateral     history of  . Pulmonary nodules     history of  . History of pancreatitis     pt denies  . Cystocele     history with rectocele, pt denies  . Scoliosis   . Varicose vein   . Non Hodgkin's lymphoma   . Acute bronchitis 10/09/2012  . Pacemaker 09/15/2011    Medtronic Revo  . Dysrhythmia   . Shortness of breath     with exertion  . GERD (gastroesophageal reflux disease)   . H/O hiatal hernia   . Arthritis   . Cataract     bil  . Hemorrhoid   . Pericarditis   . Atrial fibrillation     History   Social History  . Marital Status: Widowed    Spouse Name: N/A    Number of Children: 3  . Years of Education: N/A   Occupational History  . self employed    Social History Main Topics  . Smoking status: Never Smoker   . Smokeless tobacco: Never Used  . Alcohol Use: Yes     Comment: 1 glass of wine occasionally  . Drug Use: No  . Sexual Activity: Not on file   Other  Topics Concern  . Not on file   Social History Narrative  . No narrative on file    Past Surgical History  Procedure Laterality Date  . Pacemaker insertion  09/15/11    MDT  . Tonsillectomy    . Breast surgery  07/30/01    biopsy x 3, bilateral  . Abdominal hysterectomy  04/29/04    partial  . Mass excision Right 10/28/2012    Procedure: Removal of mass on neck       ;  Surgeon: Ardeth Sportsman, MD;  Location: [facility] OR;  Service: General;  Laterality: Right;    Family History  Problem Relation Age of Onset  . Heart disease Daughter     congenital "whole in her heart"  . Stroke Mother   . Lymphoma Daughter     nonhodgkins  . Stroke Father     Allergies  Allergen Reactions  . Amoxicillin Hives    Hives  . Codeine Hives    REACTION: "makes her crazy" Hives  . Demerol Hives  . Morphine And Related Hives  . Vicodin [Hydrocodone-Acetaminophen]  Hives  . Sulfonamide Derivatives Rash    REACTION: Rash    Current Outpatient Prescriptions on File Prior to Visit  Medication Sig Dispense Refill  . acetaminophen (TYLENOL) 500 MG tablet Take 500 mg by mouth every 6 (six) hours as needed for pain.      [name] ALPRAZolam (XANAX) 0.25 MG tablet take 1 tablet by mouth once daily if needed for sleep OR anxiety  30 tablet  1  . amLODipine (NORVASC) 10 MG tablet Take 10 mg by mouth every morning.       [name] aspirin 81 MG tablet Take 81 mg by mouth daily.      [name] KRILL OIL OMEGA-3 PO Take 1 capsule by mouth daily.      [name] levothyroxine (SYNTHROID, LEVOTHROID) 100 MCG tablet take 1 tablet by mouth once daily  90 tablet  0  . omeprazole (PRILOSEC) 40 MG capsule Take 1 capsule (40 mg total) by mouth 2 (two) times daily.  180 capsule  1  . traMADol (ULTRAM) 50 MG tablet 1-2 tablet po q 6 hours prn  15 tablet  0  . denosumab (PROLIA) 60 MG/ML SOLN injection Inject 60 mg into the skin every 6 (six) months. Administer in upper arm, thigh, or abdomen       No current facility-administered medications on file  prior to visit.    BP 110/80  Pulse 66  Temp(Src) 97.6 F (36.4 C) (Oral)  Resp 16  Ht 5\' 4"  (1.626 m)  Wt 142 lb (64.411 kg)  BMI 24.36 kg/m2  SpO2 96%    Objective:   Physical Exam  Constitutional: She is oriented to person, place, and time. She appears well-developed and well-nourished. No distress.  Cardiovascular: Normal rate and regular rhythm.   No murmur heard. Pulmonary/Chest: Effort normal and breath sounds normal. No respiratory distress. She has no wheezes. She has no rales. She exhibits no tenderness.  Musculoskeletal:  Muscle wasting noted between the right clavicle and the right trapezius.  R shoulder appears lower than the left.    Strong equal hand grasps are noted.   Neurological: She is alert and oriented to person, place, and time.  Psychiatric: She has a normal mood and affect. Her behavior is normal. Judgment and thought content normal.          Assessment & Plan:

## 2013-02-21 NOTE — Patient Instructions (Addendum)
Please start gabapentin 3 times a day as tolerated for pain. You will be contacted about your referral to neurology and endocrinology. Follow up in 3 months, sooner if problems/concerns.

## 2013-02-21 NOTE — Assessment & Plan Note (Signed)
BP Readings from Last 3 Encounters:  02/21/13 110/80  02/09/13 130/82  02/08/13 120/67   BP stable on amlodipine.  Continue same.

## 2013-02-21 NOTE — Assessment & Plan Note (Signed)
Clinically stable on synthroid. Plan TSH next visit.

## 2013-02-21 NOTE — Assessment & Plan Note (Signed)
Refer to Dr. Lafe Garin

## 2013-02-21 NOTE — Assessment & Plan Note (Signed)
Management per oncology. 

## 2013-02-28 ENCOUNTER — Encounter: Payer: Self-pay | Admitting: Cardiovascular Disease

## 2013-03-07 ENCOUNTER — Other Ambulatory Visit: Payer: Self-pay | Admitting: Internal Medicine

## 2013-03-07 ENCOUNTER — Encounter: Payer: Self-pay | Admitting: Neurology

## 2013-03-07 ENCOUNTER — Ambulatory Visit (INDEPENDENT_AMBULATORY_CARE_PROVIDER_SITE_OTHER): Payer: No Typology Code available for payment source | Admitting: Neurology

## 2013-03-07 DIAGNOSIS — M24819 Other specific joint derangements of unspecified shoulder, not elsewhere classified: Secondary | ICD-10-CM

## 2013-03-07 DIAGNOSIS — M6250 Muscle wasting and atrophy, not elsewhere classified, unspecified site: Secondary | ICD-10-CM

## 2013-03-07 MED ORDER — GABAPENTIN 300 MG PO CAPS: 300.0000 mg | ORAL_CAPSULE | Freq: Two times a day (BID) | ORAL | Status: DC

## 2013-03-07 NOTE — Patient Instructions (Addendum)
1.  Blood work today. 2.  EMG of the right upper extremity 3.  Increase gabapentin to 300mg  at bedtime x 5 days, then increase to twice daily thereafter 4.  Return to clinic in 2 weeks

## 2013-03-07 NOTE — Progress Notes (Signed)
[facility] Note - Initial Visit   Date: @DATE @  [name] MRN: [id number] DOB: 1934-09-09  Dear Dr Peggyann Juba:  Thank you for your kind referral of [name] for consultation of right shoulder pain. Although her history is well known to you, please allow [place] to reiterate it for the purpose of our medical record. The patient was accompanied to the clinic by self.   History of Present Illness: [name] is a 78 y.o. year-old right-handed Caucasian female with history of sinus node dysfunction and sinus arrest s/p PPM implant, recurrent Non Hodgkin's lymphoma (initially diagnosed 04/2011, on rituximab), hypertension, hypothyroidism, and GERD presenting with right shoulder pain.  She underwent excision of the right neck mass under the care of Dr. Michaell Cowing on 10/28/2012 which was performed under local anesthetics.  She had numbness across the right side of her neck, head, and shoulder which persisted for two months and slowly improved.  After the numbness improved, she started experiencing right shoulder pain and weakness.  Pain was worse at onset ranked 8/10 and more sharp in character.  Now, the pain feels like a dull ache and localized over her shoulder.  It is worse with activity and she has reduced range of motion of her shoulder because of pain and weakness.  She is unable to left things above the level of her chest and at times finds it difficult to feed self, especially if sitting at a high table.   Pain is improved with gabapentin and heat. Pain is ranked 5/10.  Denies any weakness of her hand, forearm, or neck.  She has noticed that her right shoulder is drooped and sits lower than the left.  She also complains of sharp right hip pain for the past 6 weeks which is worse when she gets up to walk, but improved within a few steps.  Pressure around the hip makes it worse.  Denies and back pain, numnbess/tingling, or bowel/bladder problems.  No truama.  She has no  pain when she is sitting or laying.  She has not done any physical therapy.    Past Medical History  Diagnosis Date  . Hypothyroidism   . Anemia     pt denies  . Osteoporosis   . Pleural effusion, bilateral     history of  . Pulmonary nodules     history of  . History of pancreatitis     pt denies  . Cystocele     history with rectocele, pt denies  . Scoliosis   . Varicose vein   . Non Hodgkin's lymphoma   . Acute bronchitis 10/09/2012  . Pacemaker 09/15/2011    Medtronic Revo  . Dysrhythmia   . Shortness of breath     with exertion  . GERD (gastroesophageal reflux disease)   . H/O hiatal hernia   . Arthritis   . Cataract     bil  . Hemorrhoid   . Pericarditis   . Atrial fibrillation     Past Surgical History  Procedure Laterality Date  . Pacemaker insertion  09/15/11    MDT  . Tonsillectomy    . Breast surgery  07/30/01    biopsy x 3, bilateral  . Abdominal hysterectomy  04/29/04    partial  . Mass excision Right 10/28/2012    Procedure: Removal of mass on neck       ;  Surgeon: Ardeth Sportsman, MD;  Location: [facility] OR;  Service: General;  Laterality: Right;  Medications:  Current Outpatient Prescriptions on File Prior to Visit  Medication Sig Dispense Refill  . acetaminophen (TYLENOL) 500 MG tablet Take 500 mg by mouth every 6 (six) hours as needed for pain.      [name] ALPRAZolam (XANAX) 0.25 MG tablet take 1 tablet by mouth once daily if needed for sleep OR anxiety  30 tablet  1  . amLODipine (NORVASC) 10 MG tablet Take 10 mg by mouth every morning.       [name] aspirin 81 MG tablet Take 81 mg by mouth daily.      [name] gabapentin (NEURONTIN) 100 MG capsule Take 1 capsule (100 mg total) by mouth 3 (three) times daily.  90 capsule  3  . KRILL OIL OMEGA-3 PO Take 1 capsule by mouth daily.      [name] levothyroxine (SYNTHROID, LEVOTHROID) 100 MCG tablet take 1 tablet by mouth once daily  90 tablet  0  . omeprazole (PRILOSEC) 40 MG capsule Take 1 capsule (40 mg total) by mouth 2  (two) times daily.  180 capsule  1  . denosumab (PROLIA) 60 MG/ML SOLN injection Inject 60 mg into the skin every 6 (six) months. Administer in upper arm, thigh, or abdomen      . traMADol (ULTRAM) 50 MG tablet 1-2 tablet po q 6 hours prn  15 tablet  0   No current facility-administered medications on file prior to visit.    Allergies:  Allergies  Allergen Reactions  . Amoxicillin Hives    Hives  . Codeine Hives    REACTION: "makes her crazy" Hives  . Demerol Hives  . Morphine And Related Hives  . Vicodin [Hydrocodone-Acetaminophen] Hives  . Sulfonamide Derivatives Rash    REACTION: Rash    Family History: Family History  Problem Relation Age of Onset  . Heart disease Daughter     congenital "whole in her heart"  . Stroke Mother   . Lymphoma Daughter     nonhodgkins  . Stroke Father     Social History: History   Social History  . Marital Status: Widowed    Spouse Name: N/A    Number of Children: 3  . Years of Education: N/A   Occupational History  . self employed    Social History Main Topics  . Smoking status: Never Smoker   . Smokeless tobacco: Never Used  . Alcohol Use: Yes     Comment: 1 glass of wine occasionally  . Drug Use: No  . Sexual Activity: Not on file   Other Topics Concern  . Not on file   Social History Narrative  . No narrative on file    Review of Systems:  CONSTITUTIONAL: No fevers, chills, night sweats, or weight loss.   EYES: No visual changes or eye pain ENT: No hearing changes.  No history of nose bleeds.   RESPIRATORY: No cough, wheezing and shortness of breath.   CARDIOVASCULAR: Negative for chest pain, and palpitations.   GI: Negative for abdominal discomfort, blood in stools or black stools.  No recent change in bowel habits.   GU:  No history of incontinence.   MUSCLOSKELETAL: + joint pain.  No myalgias.   SKIN: Negative for lesions, rash, and itching.   HEMATOLOGY/ONCOLOGY: Negative for prolonged bleeding, bruising  easily, and swollen nodes.  No history of cancer.   ENDOCRINE: Negative for cold or heat intolerance, polydipsia or goiter.   PSYCH:  No depression or anxiety symptoms.   NEURO: As Above.   Vital  Signs:  BP 124/72  Pulse 68  Temp(Src) 97.7 F (36.5 C) (Oral)  Resp 16  Ht 5\' 5"  (1.651 m)  Wt 142 lb (64.411 kg)  BMI 23.63 kg/m2 Pain Scale: 5 on a scale of 0-10   General Medical Exam: General:  Frail, well appearing, comfortable.   Eyes/ENT: see cranial nerve examination.   Neck: No masses appreciated.  Full range of motion without tenderness.  No carotid bruits. Respiratory:  Clear to auscultation, good air entry bilaterally.   Cardiac:  Regular rate and rhythm, no murmur.   GI:  Soft, non-tender, non-distended abdomen.  Bowel sounds normal.  Back:  No pain to palpation of spinous processes.   Extremities:  Right shoulder is asymmetrically lower than left side. Right shoulder abduction is reduced to ~80 degrees in active motion, passive ROM testing increased ROM to ~ 120 degrees Skin:  Skin color, texture, turgor normal. No rashes or lesions.  Neurological Exam: MENTAL STATUS including orientation to time, place, person, recent and remote memory, attention span and concentration, language, and fund of knowledge is normal.  Speech is not dysarthric.  CRANIAL NERVES: II:  No visual field defects.  Unremarkable fundi.   III-IV-VI: Pupils equal round and reactive to light.  Normal conjugate, extra-ocular eye movements in all directions of gaze.  No nystagmus.  No ptosis prior to or post sustained upgaze.   V:  Normal facial sensation.    VII:  Normal facial symmetry and movements.   VIII:  Normal hearing and vestibular function.   IX-X:  Normal palatal movement.   XI:  Normal shoulder shrug and head rotation.   XII:  Normal tongue strength and range of motion, no deviation or fasciculation.  MOTOR:  Right supraclavicular groove is scooped out compared to left side.  There is mild  SCM and trapezius atrophy with 5-/5 motor strength on the right side. Muscle bulk is preserved of the supraspinatus, infraspinatus, and rhomboid muscles.  There is mild lateral scapular winging with arms stretched to the side on the right.  No fasciculations or abnormal movements.  No pronator drift.  Tone is increased at the right shoulder (1+ Ashworth scale).   Neck flexion and extension is 5/5  Right Upper Extremity:    Left Upper Extremity:    Deltoid  5-/5   Deltoid  5/5   Infraspinatus  5/5  Infraspinatus 5/5  Pectoralis 5/5  Pectoralis 5/5  Rhomboids 5/5  Rhomboids 5/5  Biceps  5/5   Biceps  5/5   Triceps  5/5   Triceps  5/5   Wrist extensors  5/5   Wrist extensors  5/5   Wrist flexors  5/5   Wrist flexors  5/5   Finger extensors  5/5   Finger extensors  5/5   Finger flexors  5/5   Finger flexors  5/5   Dorsal interossei  5/5   Dorsal interossei  5/5   Abductor pollicis  5/5   Abductor pollicis  5/5   Tone (Ashworth scale)  0  Tone (Ashworth scale)  0   Right Lower Extremity:    Left Lower Extremity:    Hip flexors  5/5   Hip flexors  5/5   Hip extensors  5/5   Hip extensors  5/5   Knee flexors  5/5   Knee flexors  5/5   Knee extensors  5/5   Knee extensors  5/5   Dorsiflexors  5/5   Dorsiflexors  5/5   Plantarflexors  5/5  Plantarflexors  5/5   Toe extensors  5/5   Toe extensors  5/5   Toe flexors  5/5   Toe flexors  5/5   Tone (Ashworth scale)  0  Tone (Ashworth scale)  0   MSRs:  Right                                                                 Left brachioradialis 2+  brachioradialis 2+  biceps 2+  biceps 2+  triceps 2+  triceps 2+  patellar 0  patellar 0  ankle jerk 0  ankle jerk 0  Hoffman no  Hoffman no  plantar response mute  plantar response mute    SENSORY:  Normal and symmetric perception of light touch, pinprick, vibration, and proprioception.  Romberg's sign absent.  No extinction on double simultaneous stimulation.    COORDINATION/GAIT: Normal  finger-to- nose-finger and heel-to-shin.  Intact rapid alternating movements bilaterally.  Able to rise from a chair without using arms.  Gait narrow based and stable.    IMPRESSION: Ms. Eifler is a 78 year old female with history of sinus arrest s/p PPM implant, recurrent Non Hodgkin's lymphoma (initially diagnosed 04/2011, on rituximab), hypertension, hypothyroidism, and GERD presenting with right shoulder pain and weakness.  Her neurological examination discloses prominent loss of soft tissue and muscle atrophy (SCM) over the right supraclavicular groove with mild weakness of shoulder abduction (?pain related). Tone is mildly increased at the right shoulder.  Overall, her motor strength of the right upper extremity is surprisingly intact.  To better characterize her symptoms, I would like to obtain and EMG to evaluate for [place]-Turner syndrome as well as other nerve injury.  For symptomatic control, I will increase her neurontin since she is tolerating it well but continues to have pain.     PLAN/RECOMMENDATIONS:  1. Check ESR, CRP, CK, aldolase 2.  EMG of the right upper extremity 3.  Increase gabapentin to 300mg  at bedtime x 5 days, then increase to twice daily thereafter 4.  Return to clinic in 2 weeks   The duration of this appointment visit was 60 minutes of face-to-face time with the patient.  At least 50% of this time was spent in counseling, explanation of diagnosis, planning of further management, and coordination of care.   Thank you for allowing me to participate in patient's care.  If I can answer any additional questions, I would be pleased to do so.    Sincerely,    Surie Suchocki K. Allena Katz, DO

## 2013-03-09 ENCOUNTER — Encounter: Payer: Self-pay | Admitting: Endocrinology

## 2013-03-09 ENCOUNTER — Ambulatory Visit (INDEPENDENT_AMBULATORY_CARE_PROVIDER_SITE_OTHER): Payer: No Typology Code available for payment source | Admitting: Endocrinology

## 2013-03-09 VITALS — BP 122/80 | HR 75 | Ht 65.0 in | Wt 143.0 lb

## 2013-03-09 DIAGNOSIS — E041 Nontoxic single thyroid nodule: Secondary | ICD-10-CM

## 2013-03-09 NOTE — Patient Instructions (Addendum)
In my opinion, you don't need to repeat the ultrasound. Each year, you should have your thyroid examined, and your blood test.  I would be happy to do this, but you may find it easier to have this done by [name] at your annual checkup.

## 2013-03-09 NOTE — Progress Notes (Signed)
Subjective:    Patient ID: [name], female    DOB: 09/23/1934, 78 y.o.   MRN: [id number]  HPI Pt reports hypothyroidism, since approx 2007.  in 2010, she was noted on CT to have unchanged pulm nodules, and assoc goiter.  She has slight pain at the right shoulder, but not at the neck.   Past Medical History  Diagnosis Date  . Hypothyroidism   . Anemia     pt denies  . Osteoporosis   . Pleural effusion, bilateral     history of  . Pulmonary nodules     history of  . History of pancreatitis     pt denies  . Cystocele     history with rectocele, pt denies  . Scoliosis   . Varicose vein   . Non Hodgkin's lymphoma   . Acute bronchitis 10/09/2012  . Pacemaker 09/15/2011    Medtronic Revo  . Dysrhythmia   . Shortness of breath     with exertion  . GERD (gastroesophageal reflux disease)   . H/O hiatal hernia   . Arthritis   . Cataract     bil  . Hemorrhoid   . Pericarditis   . Atrial fibrillation     Past Surgical History  Procedure Laterality Date  . Pacemaker insertion  09/15/11    MDT  . Tonsillectomy    . Breast surgery  07/30/01    biopsy x 3, bilateral  . Abdominal hysterectomy  04/29/04    partial  . Mass excision Right 10/28/2012    Procedure: Removal of mass on neck       ;  Surgeon: Ardeth Sportsman, MD;  Location: [facility] OR;  Service: General;  Laterality: Right;    History   Social History  . Marital Status: Widowed    Spouse Name: N/A    Number of Children: 3  . Years of Education: N/A   Occupational History  . self employed    Social History Main Topics  . Smoking status: Never Smoker   . Smokeless tobacco: Never Used  . Alcohol Use: Yes     Comment: 1 glass of wine occasionally  . Drug Use: No  . Sexual Activity: Not on file   Other Topics Concern  . 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  She lives alone in a 3-story home.  There is a lift  chair which she does not use (placed for her husband).    Current Outpatient Prescriptions on File Prior to Visit  Medication Sig Dispense Refill  . acetaminophen (TYLENOL) 500 MG tablet Take 500 mg by mouth every 6 (six) hours as needed for pain.      [name] ALPRAZolam (XANAX) 0.25 MG tablet take 1 tablet by mouth once daily if needed for sleep OR anxiety  30 tablet  1  . amLODipine (NORVASC) 10 MG tablet Take 10 mg by mouth every morning.       [name] aspirin 81 MG tablet Take 81 mg by mouth daily.      [name] denosumab (PROLIA) 60 MG/ML SOLN injection Inject 60 mg into the skin every 6 (six) months. Administer in upper arm, thigh, or abdomen      . gabapentin (NEURONTIN) 300 MG capsule Take 1 capsule (300 mg total) by mouth 2 (two) times daily.  60 capsule  3  . KRILL OIL OMEGA-3 PO Take 1 capsule by mouth  daily.      . levothyroxine (SYNTHROID, LEVOTHROID) 100 MCG tablet take 1 tablet by mouth once daily  90 tablet  0  . omeprazole (PRILOSEC) 40 MG capsule Take 1 capsule (40 mg total) by mouth daily.  180 capsule  1  . traMADol (ULTRAM) 50 MG tablet 1-2 tablet po q 6 hours prn  15 tablet  0   No current facility-administered medications on file prior to visit.    Allergies  Allergen Reactions  . Amoxicillin Hives    Hives  . Codeine Hives    REACTION: "makes her crazy" Hives  . Demerol Hives  . Morphine And Related Hives  . Vicodin [Hydrocodone-Acetaminophen] Hives  . Sulfonamide Derivatives Rash    REACTION: Rash    Family History  Problem Relation Age of Onset  . Heart disease Daughter     congenital "whole in her heart"  . Stroke Mother   . Lymphoma Daughter     nonhodgkins  . Stroke Father   . Breast cancer Sister   . Heart disease Brother   niece had thyroidectomy (benign)  BP 122/80  Pulse 75  Ht 5\' 5"  (1.651 m)  Wt 143 lb (64.864 kg)  BMI 23.8 kg/m2  SpO2 97%  Review of Systems denies depression, hair loss, sob, weight gain, memory loss, constipation, numbness, blurry  vision, myalgias, dry skin, rhinorrhea, and syncope.  She has leg cramps and easy bruising.    Objective:   Physical Exam VS: see vs page GEN: no distress HEAD: head: no deformity eyes: no periorbital swelling, no proptosis external nose and ears are normal mouth: no lesion seen NECK: thyroid has slightly irreg surface, but i can't palpate any discrete nodules. CHEST WALL: no deformity LUNGS:  Clear to auscultation CV: reg rate and rhythm, no murmur ABD: abdomen is soft, nontender.  no hepatosplenomegaly.  not distended.  no hernia MUSCULOSKELETAL: muscle bulk and strength are grossly normal.  no obvious joint swelling.  gait is normal and steady EXTEMITIES: no deformity.  no ulcer on the feet.  feet are of normal color and temp.  no edema PULSES: dorsalis pedis intact bilat.  no carotid bruit NEURO:  cn 2-12 grossly intact.   readily moves all 4's.  sensation is intact to touch on the feet SKIN:  Normal texture and temperature.  No rash or suspicious lesion is visible.   NODES:  None palpable at the neck PSYCH: alert, oriented x3.  Does not appear anxious nor depressed.  Lab Results  Component Value Date   TSH 2.238 10/06/2012  (i reviewed [place], cytol, and PET reports)    Assessment & Plan:  Chronic hypothyroidism, well-replaced Small multinodular goiter, unchanged.  No further f/u is needed. Lymphoma.  Although the thyroid is hypermetabolic on pet scan, this is exceedingly unlikely to indicate thyroid malignancy.

## 2013-03-10 ENCOUNTER — Telehealth: Payer: Self-pay | Admitting: Neurology

## 2013-03-10 NOTE — Telephone Encounter (Signed)
Returning someone's call. Unsure who or why. Pt is expecting lab results from Dr. Allena Katz. Please call / Sherri

## 2013-03-13 ENCOUNTER — Telehealth: Payer: Self-pay | Admitting: Medical Oncology

## 2013-03-13 ENCOUNTER — Telehealth: Payer: Self-pay | Admitting: Internal Medicine

## 2013-03-13 ENCOUNTER — Telehealth: Payer: Self-pay | Admitting: *Deleted

## 2013-03-13 NOTE — Telephone Encounter (Signed)
Left message for patient to call back  

## 2013-03-13 NOTE — Telephone Encounter (Signed)
Received voicemail from pt stating she has cataract surgery scheduled for tomorrow and woke up today with a scratchy throat. Pt requests antibiotic be called in. Notified scheduler to contact pt and arrange appt for evaluation to determine if antibiotic is needed. Pt scheduled appt with Malva Cogan, PA on 03/15/13.

## 2013-03-13 NOTE — Telephone Encounter (Signed)
New problem   Pt is having eye surgery tomorrow and need a clearance to have it done. Pt will fax over medical clearance form today so it can be filled out and faxed back to [facility].

## 2013-03-13 NOTE — Telephone Encounter (Signed)
Pt calling about clearance for cataract surgery. She faxed me a form and i called her back and left a message  that  the from she sent shows her Dr Sloan Leiter cleared her.

## 2013-03-13 NOTE — Telephone Encounter (Signed)
Message forwarded to Dr. Royann Shivers.  Clearance form received today and on Dr. Erin Hearing cart.

## 2013-03-13 NOTE — Telephone Encounter (Signed)
Please fax clarence for cataract surgery tomorrow-need this asap.[phone]

## 2013-03-14 NOTE — Telephone Encounter (Signed)
Dr. Allena Katz, have you reviewed her results?  I don't see where we have called her with anything.

## 2013-03-14 NOTE — Telephone Encounter (Signed)
[name], this was mistakenly sent to me

## 2013-03-14 NOTE — Telephone Encounter (Signed)
Returned call to patient with results of labs which were normal.   EMG is pending and is scheduled for 9/22.  All questions were answered.  Donika K. Allena Katz, DO

## 2013-03-15 ENCOUNTER — Ambulatory Visit: Payer: No Typology Code available for payment source | Admitting: Physician Assistant

## 2013-03-15 DIAGNOSIS — Z0289 Encounter for other administrative examinations: Secondary | ICD-10-CM

## 2013-03-17 ENCOUNTER — Ambulatory Visit: Payer: No Typology Code available for payment source | Admitting: Neurology

## 2013-03-20 ENCOUNTER — Encounter: Payer: No Typology Code available for payment source | Admitting: Neurology

## 2013-03-27 ENCOUNTER — Telehealth: Payer: Self-pay | Admitting: Neurology

## 2013-03-27 ENCOUNTER — Ambulatory Visit: Payer: No Typology Code available for payment source | Admitting: Neurology

## 2013-03-27 NOTE — Telephone Encounter (Signed)
Spoke w/ patient regarding missed EMG and follow up appts. Per patient, she has been "fighting a cold" and was unable to come in for the appts. She is unsure if she wants to r/s at this time. She is still not feeling well and has lots already scheduled for October. Patient will consider rescheduling and will call our office when she is ready to do so / Sherri S.

## 2013-03-31 ENCOUNTER — Other Ambulatory Visit: Payer: Self-pay | Admitting: Family Medicine

## 2013-03-31 NOTE — Telephone Encounter (Signed)
Rx request to pharmacy/SLS  

## 2013-04-10 ENCOUNTER — Other Ambulatory Visit (HOSPITAL_BASED_OUTPATIENT_CLINIC_OR_DEPARTMENT_OTHER): Payer: No Typology Code available for payment source | Admitting: Lab

## 2013-04-10 ENCOUNTER — Ambulatory Visit (HOSPITAL_BASED_OUTPATIENT_CLINIC_OR_DEPARTMENT_OTHER): Payer: No Typology Code available for payment source | Admitting: Internal Medicine

## 2013-04-10 ENCOUNTER — Ambulatory Visit (HOSPITAL_BASED_OUTPATIENT_CLINIC_OR_DEPARTMENT_OTHER): Payer: No Typology Code available for payment source

## 2013-04-10 ENCOUNTER — Encounter: Payer: Self-pay | Admitting: Internal Medicine

## 2013-04-10 VITALS — BP 112/70 | HR 66 | Temp 96.4°F | Resp 18

## 2013-04-10 VITALS — BP 140/87 | HR 92 | Temp 96.9°F | Resp 18 | Ht 65.0 in | Wt 143.3 lb

## 2013-04-10 DIAGNOSIS — C8291 Follicular lymphoma, unspecified, lymph nodes of head, face, and neck: Secondary | ICD-10-CM

## 2013-04-10 DIAGNOSIS — Z5112 Encounter for antineoplastic immunotherapy: Secondary | ICD-10-CM

## 2013-04-10 DIAGNOSIS — C8590 Non-Hodgkin lymphoma, unspecified, unspecified site: Secondary | ICD-10-CM

## 2013-04-10 DIAGNOSIS — C8290 Follicular lymphoma, unspecified, unspecified site: Secondary | ICD-10-CM

## 2013-04-10 LAB — COMPREHENSIVE METABOLIC PANEL (CC13)
ALT: 15 U/L (ref 0–55)
AST: 25 U/L (ref 5–34)
Anion Gap: 9 mEq/L (ref 3–11)
Creatinine: 0.8 mg/dL (ref 0.6–1.1)
Total Bilirubin: 0.31 mg/dL (ref 0.20–1.20)

## 2013-04-10 LAB — CBC WITH DIFFERENTIAL/PLATELET
BASO%: 2.4 % — ABNORMAL HIGH (ref 0.0–2.0)
EOS%: 8.4 % — ABNORMAL HIGH (ref 0.0–7.0)
HCT: 37.8 % (ref 34.8–46.6)
LYMPH%: 32.4 % (ref 14.0–49.7)
MCH: 27.4 pg (ref 25.1–34.0)
MCHC: 32 g/dL (ref 31.5–36.0)
MCV: 85.7 fL (ref 79.5–101.0)
NEUT%: 42 % (ref 38.4–76.8)
Platelets: 353 10*3/uL (ref 145–400)

## 2013-04-10 MED ORDER — ACETAMINOPHEN 325 MG PO TABS
ORAL_TABLET | ORAL | Status: AC
  Filled 2013-04-10: qty 2

## 2013-04-10 MED ORDER — SODIUM CHLORIDE 0.9 % IV SOLN
Freq: Once | INTRAVENOUS | Status: AC
  Administered 2013-04-10: 09:00:00 via INTRAVENOUS

## 2013-04-10 MED ORDER — ACETAMINOPHEN 325 MG PO TABS
650.0000 mg | ORAL_TABLET | Freq: Once | ORAL | Status: AC
  Administered 2013-04-10: 650 mg via ORAL

## 2013-04-10 MED ORDER — DIPHENHYDRAMINE HCL 25 MG PO CAPS
50.0000 mg | ORAL_CAPSULE | Freq: Once | ORAL | Status: AC
  Administered 2013-04-10: 50 mg via ORAL

## 2013-04-10 MED ORDER — SODIUM CHLORIDE 0.9 % IV SOLN
375.0000 mg/m2 | Freq: Once | INTRAVENOUS | Status: AC
  Administered 2013-04-10: 600 mg via INTRAVENOUS
  Filled 2013-04-10: qty 60

## 2013-04-10 MED ORDER — DIPHENHYDRAMINE HCL 25 MG PO CAPS
ORAL_CAPSULE | ORAL | Status: AC
  Filled 2013-04-10: qty 2

## 2013-04-10 NOTE — Patient Instructions (Signed)
Proceed with maintenance Rituxan today as scheduled.  Followup visit in 2 months with the next cycle of your treatment.

## 2013-04-10 NOTE — Progress Notes (Signed)
[facility] [phone]   [phone]  OFFICE PROGRESS NOTE  Lemont Fillers., NP [address] [place] [address]  DIAGNOSIS: recurrent Non Hodgkin's lymphoma, follicular center cell type with a predominant follicular pattern.Grade (if applicable): Favor high grade (grade 3/3), initially diagnosed in November of 2012   PRIOR THERAPY:  1) Status post treatment with Rituxan weekly for 3 doses in addition to 3 tablets of Afinitor at [facility] in [place].  2) Status post surgical excision of the right neck mass under the care of Dr. Michaell Cowing on 10/28/2012.   CURRENT THERAPY: Maintenance Rituxan 375 mg/M2 every 2 months status post 2 cycles of his dose was given on 12/14/2012.  INTERVAL HISTORY: [name] 78 y.o. female returns to the clinic today for three-month followup visit. The patient is tolerating her current treatment with Rituxan fairly well. She denied having any significant nausea or vomiting. She has no fever or chills. The patient denied having any significant weight loss or night sweats. She has no chest pain, shortness breath, cough or hemoptysis. She is here today to start cycle #3 of her systemic maintenance therapy with Rituxan.   MEDICAL HISTORY: Past Medical History  Diagnosis Date  . Hypothyroidism   . Anemia     pt denies  . Osteoporosis   . Pleural effusion, bilateral     history of  . Pulmonary nodules     history of  . History of pancreatitis     pt denies  . Cystocele     history with rectocele, pt denies  . Scoliosis   . Varicose vein   . Non Hodgkin's lymphoma   . Acute bronchitis 10/09/2012  . Pacemaker 09/15/2011    Medtronic Revo  . Dysrhythmia   . Shortness of breath     with exertion  . GERD (gastroesophageal reflux disease)   . H/O hiatal hernia   . Arthritis   . Cataract     bil  . Hemorrhoid   . Pericarditis   . Atrial fibrillation     ALLERGIES:  is allergic to  amoxicillin; codeine; demerol; morphine and related; vicodin; and sulfonamide derivatives.  MEDICATIONS:  Current Outpatient Prescriptions  Medication Sig Dispense Refill  . acetaminophen (TYLENOL) 500 MG tablet Take 500 mg by mouth every 6 (six) hours as needed for pain.      [name] ALPRAZolam (XANAX) 0.25 MG tablet take 1 tablet by mouth once daily if needed for sleep OR anxiety  30 tablet  1  . amLODipine (NORVASC) 10 MG tablet Take 10 mg by mouth every morning.       [name] aspirin 81 MG tablet Take 81 mg by mouth daily.      [name] denosumab (PROLIA) 60 MG/ML SOLN injection Inject 60 mg into the skin every 6 (six) months. Administer in upper arm, thigh, or abdomen      . gabapentin (NEURONTIN) 300 MG capsule Take 1 capsule (300 mg total) by mouth 2 (two) times daily.  60 capsule  3  . KRILL OIL OMEGA-3 PO Take 1 capsule by mouth daily.      [name] levothyroxine (SYNTHROID, LEVOTHROID) 100 MCG tablet take 1 tablet by mouth once daily  90 tablet  0  . omeprazole (PRILOSEC) 40 MG capsule Take 1 capsule (40 mg total) by mouth daily.  180 capsule  1   No current facility-administered medications for this visit.    SURGICAL HISTORY:  Past Surgical  History  Procedure Laterality Date  . Pacemaker insertion  09/15/11    MDT  . Tonsillectomy    . Breast surgery  07/30/01    biopsy x 3, bilateral  . Abdominal hysterectomy  04/29/04    partial  . Mass excision Right 10/28/2012    Procedure: Removal of mass on neck       ;  Surgeon: Ardeth Sportsman, MD;  Location: [facility] OR;  Service: General;  Laterality: Right;    REVIEW OF SYSTEMS:  A comprehensive review of systems was negative.   PHYSICAL EXAMINATION: General appearance: alert, cooperative and no distress Head: Normocephalic, without obvious abnormality, atraumatic Neck: no adenopathy, no JVD, supple, symmetrical, trachea midline and thyroid not enlarged, symmetric, no tenderness/mass/nodules Lymph nodes: Cervical, supraclavicular, and axillary nodes  normal. Resp: clear to auscultation bilaterally Back: symmetric, no curvature. ROM normal. No CVA tenderness. Cardio: regular rate and rhythm, S1, S2 normal, no murmur, click, rub or gallop GI: soft, non-tender; bowel sounds normal; no masses,  no organomegaly Extremities: extremities normal, atraumatic, no cyanosis or edema  ECOG PERFORMANCE STATUS: 0 - Asymptomatic  Blood pressure 140/87, pulse 92, temperature 96.9 F (36.1 C), temperature source Oral, resp. rate 18, height 5\' 5"  (1.651 m), weight 143 lb 4.8 oz (65 kg).  LABORATORY DATA: Lab Results  Component Value Date   WBC 6.8 04/10/2013   HGB 12.1 04/10/2013   HCT 37.8 04/10/2013   MCV 85.7 04/10/2013   PLT 353 04/10/2013      Chemistry      Component Value Date/Time   NA 142 02/08/2013 0953   NA 140 12/06/2012 1649   K 4.1 02/08/2013 0953   K 3.9 12/06/2012 1649   CL 109* 12/14/2012 1009   CL 108 12/06/2012 1649   CO2 24 02/08/2013 0953   CO2 27 12/06/2012 1649   BUN 16.8 02/08/2013 0953   BUN 16 12/06/2012 1649   CREATININE 0.8 02/08/2013 0953   CREATININE 0.8 12/06/2012 1649   CREATININE 0.75 10/06/2012 1202      Component Value Date/Time   CALCIUM 10.4 02/08/2013 0953   CALCIUM 9.1 12/06/2012 1649   ALKPHOS 98 02/08/2013 0953   ALKPHOS 77 10/06/2012 1202   AST 28 02/08/2013 0953   AST 28 10/06/2012 1202   ALT 17 02/08/2013 0953   ALT 19 10/06/2012 1202   BILITOT 0.31 02/08/2013 0953   BILITOT 0.3 10/06/2012 1202       RADIOGRAPHIC STUDIES: No results found.  ASSESSMENT AND PLAN: This is a very pleasant 78 years old white female with recurrent non-Hodgkin's lymphoma, follicular center cell type currently on maintenance treatment with single agent Rituxan status post 2 cycles. The patient is tolerating her treatment fairly well with no significant adverse effects. I recommended for her to proceed with cycle #3 today as scheduled. She would come back for followup visit in 2 months with the next cycle of her  treatment.  The patient voices understanding of current disease status and treatment options and is in agreement with the current care plan.  All questions were answered. The patient knows to call the clinic with any problems, questions or concerns. We can certainly see the patient much sooner if necessary.

## 2013-04-10 NOTE — Patient Instructions (Signed)
[facility] Discharge Instructions for Patients Receiving Chemotherapy  Today you received the following chemotherapy agents Rituxan.  To help prevent nausea and vomiting after your treatment, we encourage you to take your nausea medication as prescribed.   If you develop nausea and vomiting that is not controlled by your nausea medication, call the clinic.   BELOW ARE SYMPTOMS THAT SHOULD BE REPORTED IMMEDIATELY:  *FEVER GREATER THAN 100.5 F  *CHILLS WITH OR WITHOUT FEVER  NAUSEA AND VOMITING THAT IS NOT CONTROLLED WITH YOUR NAUSEA MEDICATION  *UNUSUAL SHORTNESS OF BREATH  *UNUSUAL BRUISING OR BLEEDING  TENDERNESS IN MOUTH AND THROAT WITH OR WITHOUT PRESENCE OF ULCERS  *URINARY PROBLEMS  *BOWEL PROBLEMS  UNUSUAL RASH Items with * indicate a potential emergency and should be followed up as soon as possible.  Feel free to call the clinic you have any questions or concerns. The clinic phone number is [phone].    

## 2013-04-13 ENCOUNTER — Telehealth: Payer: Self-pay | Admitting: *Deleted

## 2013-04-13 ENCOUNTER — Other Ambulatory Visit: Payer: Self-pay | Admitting: Internal Medicine

## 2013-04-13 NOTE — Telephone Encounter (Signed)
Per staff message and POF I have scheduled appts.  JMW  

## 2013-04-13 NOTE — Telephone Encounter (Signed)
sw pt able her appts she decided to come on 06/08/13 w/labs @9 :15am and ov @ 9:45am with tx to follow. i sm MW to add the tx. Pt is aware...td

## 2013-04-13 NOTE — Telephone Encounter (Signed)
Rx request to pharmacy/SLS  

## 2013-04-27 ENCOUNTER — Ambulatory Visit (HOSPITAL_BASED_OUTPATIENT_CLINIC_OR_DEPARTMENT_OTHER)
Admission: RE | Admit: 2013-04-27 | Discharge: 2013-04-27 | Disposition: A | Discharge status: Home / Self Care | Payer: No Typology Code available for payment source | Source: Ambulatory Visit | Attending: Physician Assistant | Admitting: Physician Assistant

## 2013-04-27 ENCOUNTER — Ambulatory Visit (INDEPENDENT_AMBULATORY_CARE_PROVIDER_SITE_OTHER): Payer: No Typology Code available for payment source | Admitting: Physician Assistant

## 2013-04-27 ENCOUNTER — Encounter: Payer: Self-pay | Admitting: Physician Assistant

## 2013-04-27 VITALS — BP 135/81 | HR 70 | Temp 98.1°F | Resp 16 | Ht 65.0 in | Wt 145.5 lb

## 2013-04-27 DIAGNOSIS — M25551 Pain in right hip: Secondary | ICD-10-CM

## 2013-04-27 DIAGNOSIS — M4120 Other idiopathic scoliosis, site unspecified: Secondary | ICD-10-CM | POA: Insufficient documentation

## 2013-04-27 DIAGNOSIS — M25559 Pain in unspecified hip: Secondary | ICD-10-CM | POA: Insufficient documentation

## 2013-04-27 DIAGNOSIS — M79609 Pain in unspecified limb: Secondary | ICD-10-CM | POA: Insufficient documentation

## 2013-04-27 DIAGNOSIS — H9190 Unspecified hearing loss, unspecified ear: Secondary | ICD-10-CM

## 2013-04-27 DIAGNOSIS — M47817 Spondylosis without myelopathy or radiculopathy, lumbosacral region: Secondary | ICD-10-CM | POA: Insufficient documentation

## 2013-04-27 DIAGNOSIS — Z23 Encounter for immunization: Secondary | ICD-10-CM | POA: Insufficient documentation

## 2013-04-27 NOTE — Progress Notes (Signed)
Patient ID: [name], female   DOB: 08-Apr-1935, 78 y.o.   MRN: [id number]  Patient presents to clinic today c/o right hip and femur pain x 1 year.  Pain is sharp and intense, and is usually felt when standing.  Pain will throb for a while before subsiding.  Patient denies radiation of pain, numbness or tingling, or weakness of RLE.  Denies left hip or LLE pain.  Has history of scoliosis, osteoporosis and osteoarthritis.  Patient denies decrease in ROM.  Patient also endorses bilateral, gradual hearing loss over the past year.  States R ear is worse than L.  Endorses some faint tinnitus that is non-pulsatile.  Denies history of exposure to loud noise.  Denies hx of excessive cerumen or cerumen impaction.  Patient also requesting flu shot.  Vitals are good in clinic today.   Past Medical History  Diagnosis Date  . Hypothyroidism   . Anemia     pt denies  . Osteoporosis   . Pleural effusion, bilateral     history of  . Pulmonary nodules     history of  . History of pancreatitis     pt denies  . Cystocele     history with rectocele, pt denies  . Scoliosis   . Varicose vein   . Non Hodgkin's lymphoma   . Acute bronchitis 10/09/2012  . Pacemaker 09/15/2011    Medtronic Revo  . Dysrhythmia   . Shortness of breath     with exertion  . GERD (gastroesophageal reflux disease)   . H/O hiatal hernia   . Arthritis   . Cataract     bil  . Hemorrhoid   . Pericarditis   . Atrial fibrillation     Current Outpatient Prescriptions on File Prior to Visit  Medication Sig Dispense Refill  . acetaminophen (TYLENOL) 500 MG tablet Take 500 mg by mouth every 6 (six) hours as needed for pain.      [name] ALPRAZolam (XANAX) 0.25 MG tablet take 1 tablet by mouth once daily if needed for sleep OR anxiety  30 tablet  1  . amLODipine (NORVASC) 10 MG tablet take 1 tablet by mouth once daily  90 tablet  0  . aspirin 81 MG tablet Take 81 mg by mouth daily.      [name] denosumab (PROLIA) 60 MG/ML SOLN  injection Inject 60 mg into the skin every 6 (six) months. Administer in upper arm, thigh, or abdomen      . gabapentin (NEURONTIN) 300 MG capsule Take 1 capsule (300 mg total) by mouth 2 (two) times daily.  60 capsule  3  . KRILL OIL OMEGA-3 PO Take 1 capsule by mouth daily.      [name] levothyroxine (SYNTHROID, LEVOTHROID) 100 MCG tablet take 1 tablet by mouth once daily  90 tablet  0  . omeprazole (PRILOSEC) 40 MG capsule Take 1 capsule (40 mg total) by mouth daily.  180 capsule  1   No current facility-administered medications on file prior to visit.    Allergies  Allergen Reactions  . Amoxicillin Hives    Hives  . Codeine Hives    REACTION: "makes her crazy" Hives  . Demerol Hives  . Morphine And Related Hives  . Vicodin [Hydrocodone-Acetaminophen] Hives  . Sulfonamide Derivatives Rash    REACTION: Rash    Family History  Problem Relation Age of Onset  . Heart disease Daughter     congenital "whole in her heart"  . Stroke Mother   .  Lymphoma Daughter     nonhodgkins  . Stroke Father   . Breast cancer Sister   . Heart disease Brother     History   Social History  . Marital Status: Widowed    Spouse Name: N/A    Number of Children: 3  . Years of Education: N/A   Occupational History  . self employed    Social History Main Topics  . Smoking status: Never Smoker   . Smokeless tobacco: Never Used  . Alcohol Use: Yes     Comment: 1 glass of wine occasionally  . Drug Use: No  . Sexual Activity: None   Other Topics Concern  . 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  She lives alone in a 3-story home.  There is a lift chair which she does not use (placed for her husband).   ROS See HPI.  All other ROS are negative.  Filed Vitals:   04/27/13 1047  BP: 135/81  Pulse: 70  Temp: 98.1 F (36.7 C)  Resp: 16   Physical Exam  Vitals reviewed. Constitutional: She is oriented  to person, place, and time and well-developed, well-nourished, and in no distress.  HENT:  Head: Normocephalic and atraumatic.  Right Ear: External ear normal.  Left Ear: External ear normal.  Nose: Nose normal.  Mouth/Throat: Oropharynx is clear and moist. No oropharyngeal exudate.  Tympanic membranes within normal limits bilaterally  Eyes: Conjunctivae are normal. Pupils are equal, round, and reactive to light.  Neck: Neck supple.  Cardiovascular: Normal rate, regular rhythm, normal heart sounds and intact distal pulses.   Pulmonary/Chest: Effort normal and breath sounds normal. No respiratory distress. She has no wheezes. She has no rales. She exhibits no tenderness.  Musculoskeletal:  No bony spinal tenderness noted.  Patient with moderate scoliosis.  No pain with palpation of hip bones and joints noted on exam.  No muscle spasm noted.  Lymphadenopathy:    She has no cervical adenopathy.  Neurological: She is alert and oriented to person, place, and time. No cranial nerve deficit.  Skin: Skin is warm and dry. No rash noted.  Psychiatric: Affect normal.   Recent Results (from the past 2160 hour(s))  CBC WITH DIFFERENTIAL     Status: Abnormal   Collection Time    02/08/13  9:53 AM      Result Value Range   WBC 7.9  3.9 - 10.3 10e3/uL   NEUT# 4.3  1.5 - 6.5 10e3/uL   HGB 12.8  11.6 - 15.9 g/dL   HCT 86.5  78.4 - 69.6 %   Platelets 342  145 - 400 10e3/uL   MCV 85.9  79.5 - 101.0 fL   MCH 27.7  25.1 - 34.0 pg   MCHC 32.2  31.5 - 36.0 g/dL   RBC 2.95  2.84 - 1.32 10e6/uL   RDW 17.1 (*) 11.2 - 14.5 %   lymph# 1.9  0.9 - 3.3 10e3/uL   MONO# 1.0 (*) 0.1 - 0.9 10e3/uL   Eosinophils Absolute 0.5  0.0 - 0.5 10e3/uL   Basophils Absolute 0.1  0.0 - 0.1 10e3/uL   NEUT% 55.0  38.4 - 76.8 %   LYMPH% 24.5  14.0 - 49.7 %   MONO% 12.2  0.0 - 14.0 %   EOS% 6.7  0.0 - 7.0 %   BASO% 1.6  0.0 - 2.0 %   nRBC 0  0 - 0 %  LACTATE DEHYDROGENASE (CC13)     Status: Abnormal   Collection Time     02/08/13  9:53 AM      Result Value Range   LDH 295 (*) 125 - 245 U/L  COMPREHENSIVE METABOLIC PANEL (CC13)     Status: None   Collection Time    02/08/13  9:53 AM      Result Value Range   Sodium 142  136 - 145 mEq/L   Potassium 4.1  3.5 - 5.1 mEq/L   Chloride 106  98 - 109 mEq/L   CO2 24  22 - 29 mEq/L   Glucose 80  70 - 140 mg/dl   BUN 16.1  7.0 - 09.6 mg/dL   Creatinine 0.8  0.6 - 1.1 mg/dL   Total Bilirubin 0.45  0.20 - 1.20 mg/dL   Alkaline Phosphatase 98  40 - 150 U/L   AST 28  5 - 34 U/L   ALT 17  0 - 55 U/L   Total Protein 8.2  6.4 - 8.3 g/dL   Albumin 4.1  3.5 - 5.0 g/dL   Calcium 40.9  8.4 - 81.1 mg/dL  PACEMAKER DEVICE OBSERVATION     Status: None   Collection Time    02/09/13  1:42 PM      Result Value Range   PACEART PDF       DEVICE MODEL PM       BATTERY VOLTAGE      PACEMAKER DEVICE OBSERVATION     Status: None   Collection Time    02/09/13  2:12 PM      Result Value Range   DEVICE MODEL PM [id number]     [id number] Croitoru, Mihai   MD     [id number] Thurmon Fair   MD     PACEART [facility] NOTES PM       Value: Pacemaker check in clinic. Normal device function. Thresholds, sensing, impedances consistent with previous measurements. Device programmed to maximize longevity. No mode switch or high ventricular rates noted. Device programmed at appropriate safety      margins. Histogram distribution appropriate for patient activity level. Device programmed to optimize intrinsic conduction.  Patient will follow up in 3 months for a pacemaker interrogation, and in 12 months with [facility].   ATRIAL PACING PM 91.5     VENTRICULAR PACING PM 0.8     BATTERY VOLTAGE 3.00     AL IMPEDENCE PM 360     RV LEAD IMPEDENCE PM 640     RV LEAD AMPLITUDE 15.4     AL THRESHOLD 1     RV LEAD THRESHOLD 1     BAMS-0001 [id number]    SEDIMENTATION RATE     Status: None   Collection Time    03/07/13 11:50 AM      Result Value Range   Sed Rate 19  0 - 22 mm/hr  C-REACTIVE PROTEIN      Status: None   Collection Time    03/07/13 11:50 AM      Result Value Range   CRP <0.5  0.5 - 20.0 mg/dL  ALDOLASE     Status: None   Collection Time    03/07/13 11:50 AM      Result Value Range   Aldolase 6.3  <=8.1 U/L  CK     Status: None   Collection Time    03/07/13 11:50 AM      Result Value Range   Total CK 91  7 -  177 U/L  CBC WITH DIFFERENTIAL     Status: Abnormal   Collection Time    04/10/13  8:09 AM      Result Value Range   WBC 6.8  3.9 - 10.3 10e3/uL   NEUT# 2.8  1.5 - 6.5 10e3/uL   HGB 12.1  11.6 - 15.9 g/dL   HCT 40.9  81.1 - 91.4 %   Platelets 353  145 - 400 10e3/uL   MCV 85.7  79.5 - 101.0 fL   MCH 27.4  25.1 - 34.0 pg   MCHC 32.0  31.5 - 36.0 g/dL   RBC 7.82  9.56 - 2.13 10e6/uL   RDW 16.1 (*) 11.2 - 14.5 %   lymph# 2.2  0.9 - 3.3 10e3/uL   MONO# 1.0 (*) 0.1 - 0.9 10e3/uL   Eosinophils Absolute 0.6 (*) 0.0 - 0.5 10e3/uL   Basophils Absolute 0.2 (*) 0.0 - 0.1 10e3/uL   NEUT% 42.0  38.4 - 76.8 %   LYMPH% 32.4  14.0 - 49.7 %   MONO% 14.8 (*) 0.0 - 14.0 %   EOS% 8.4 (*) 0.0 - 7.0 %   BASO% 2.4 (*) 0.0 - 2.0 %  COMPREHENSIVE METABOLIC PANEL (CC13)     Status: None   Collection Time    04/10/13  8:09 AM      Result Value Range   Sodium 142  136 - 145 mEq/L   Potassium 3.7  3.5 - 5.1 mEq/L   Chloride 109  98 - 109 mEq/L   CO2 25  22 - 29 mEq/L   Glucose 83  70 - 140 mg/dl   BUN 08.6  7.0 - 57.8 mg/dL   Creatinine 0.8  0.6 - 1.1 mg/dL   Total Bilirubin 4.69  0.20 - 1.20 mg/dL   Alkaline Phosphatase 95  40 - 150 U/L   AST 25  5 - 34 U/L   ALT 15  0 - 55 U/L   Total Protein 7.3  6.4 - 8.3 g/dL   Albumin 3.7  3.5 - 5.0 g/dL   Calcium 9.5  8.4 - 62.9 mg/dL   Anion Gap 9  3 - 11 mEq/L  LACTATE DEHYDROGENASE (CC13)     Status: None   Collection Time    04/10/13  8:10 AM      Result Value Range   LDH 229  125 - 245 U/L   Assessment/Plan: Hip pain Will obtain plain films of hip/pelvis and right femur.  Will treat patient according to findings.   Patient to continue with OTC pain medications.  Need for prophylactic vaccination and inoculation against influenza Flu shot given by nursing staff  Hearing loss Referral to Audiology per patient request.  PE within normal limits.

## 2013-04-27 NOTE — Assessment & Plan Note (Signed)
Flu shot given by nursing staff. 

## 2013-04-27 NOTE — Addendum Note (Signed)
Addended by: Regis Bill on: 04/27/2013 03:35 PM   Modules accepted: Orders

## 2013-04-27 NOTE — Patient Instructions (Signed)
Please obtain imaging.  I will call you with your results and we will treat you accordingly.  Continue follow-up with oncologist.  You should schedule an appointment for an annual exam with your PCP [name].

## 2013-04-27 NOTE — Assessment & Plan Note (Signed)
Will obtain plain films of hip/pelvis and right femur.  Will treat patient according to findings.  Patient to continue with OTC pain medications.

## 2013-04-27 NOTE — Assessment & Plan Note (Signed)
Referral to Audiology per patient request.  PE within normal limits.

## 2013-05-01 ENCOUNTER — Telehealth: Payer: Self-pay | Admitting: *Deleted

## 2013-05-01 ENCOUNTER — Telehealth: Payer: Self-pay | Admitting: Physician Assistant

## 2013-05-01 DIAGNOSIS — M898X5 Other specified disorders of bone, thigh: Secondary | ICD-10-CM

## 2013-05-01 NOTE — Telephone Encounter (Signed)
Pt was seen in office on 10.30.14 and request to have a Prolia injection process initiated; explained to pt that we need approval via Insurance & then medication has to be ordered before scheduling an appointment/SLS Thanks.

## 2013-05-01 NOTE — Telephone Encounter (Signed)
[name], is pt a candidate for Prolia?

## 2013-05-01 NOTE — Telephone Encounter (Signed)
Referral to Orthopedics placed.  I will defer CT imaging to the specialist after he has evaluated the pain to make sure he doesn't want any other type of imaging.

## 2013-05-01 NOTE — Telephone Encounter (Signed)
Patient informed, understood & agreed/SLS  

## 2013-05-01 NOTE — Telephone Encounter (Signed)
Message copied by Marcelline Mates on Mon May 01, 2013  7:18 AM ------      Message from: Regis Bill      Created: Fri Apr 28, 2013  5:08 PM       Patient informed, understood & agreed; Ok to place Order for CT & Ortho Referral/SLS            ----- Message -----         From: Piedad Climes, PA-C         Sent: 04/27/2013   2:31 PM           To: Regis Bill, CMA            Please inform patient that her imaging was negative except for her severe scoliosis.  No evidence of hip or femur fracture.  Given her history, I don't think it would be unreasonable for her to see a orthopedist for further evaluation and possible CT imaging of her leg.       ------

## 2013-05-03 NOTE — Telephone Encounter (Signed)
Notified pt. She states her last DEXA has been more than 3 yrs ago and she thinks it was done at [facility].  Pt is agreeable to proceed with DEXA. Prolia insurance verification initiated and faxed to [phone]. Advised pt it can take up to 30 days for verification process. Awaiting verification.

## 2013-05-03 NOTE — Telephone Encounter (Signed)
Yes please.  Could you please ask her when her last injection was and when her last bone density was.  If she has not had a bone density in last 2-3 years, then I will order.

## 2013-05-12 ENCOUNTER — Other Ambulatory Visit: Payer: Self-pay | Admitting: *Deleted

## 2013-05-15 ENCOUNTER — Telehealth: Payer: Self-pay | Admitting: Internal Medicine

## 2013-05-15 NOTE — Telephone Encounter (Signed)
worked 11/14 POF added lab 1 hr before CT on 12/9 called pt and made aware shh

## 2013-05-23 NOTE — Telephone Encounter (Signed)
Received verification from insurance that $35 copay will apply and pt will be responsible for 20% until out of pocket max ($5000) is reached. Pt has met 2843.16 of out of pocket max.  Left message to return my cal to see if she wants to proceed with injection.

## 2013-05-24 NOTE — Telephone Encounter (Signed)
Notified pt of Prolia estimate and pt states she would like to proceed with injection.  Will order medication and call pt when it arrives to schedule a nurse visit.

## 2013-05-29 NOTE — Telephone Encounter (Signed)
Spoke with pt and scheduled injection for 05/31/13 at 1:30pm.

## 2013-05-30 ENCOUNTER — Other Ambulatory Visit (HOSPITAL_COMMUNITY): Payer: No Typology Code available for payment source

## 2013-05-30 ENCOUNTER — Other Ambulatory Visit: Payer: No Typology Code available for payment source | Admitting: Lab

## 2013-05-31 ENCOUNTER — Ambulatory Visit: Payer: No Typology Code available for payment source

## 2013-06-01 ENCOUNTER — Other Ambulatory Visit: Payer: No Typology Code available for payment source | Admitting: Lab

## 2013-06-01 ENCOUNTER — Ambulatory Visit: Payer: No Typology Code available for payment source | Admitting: Internal Medicine

## 2013-06-02 ENCOUNTER — Other Ambulatory Visit: Payer: Self-pay | Admitting: Family Medicine

## 2013-06-02 NOTE — Telephone Encounter (Signed)
Please advise refill?   Last RX was done on 02-11-13 quantity 30 with 1 refill  If ok fax to [phone]

## 2013-06-02 NOTE — Telephone Encounter (Signed)
RX faxed

## 2013-06-06 ENCOUNTER — Ambulatory Visit (HOSPITAL_COMMUNITY)
Admission: RE | Admit: 2013-06-06 | Discharge: 2013-06-06 | Disposition: A | Discharge status: Home / Self Care | Payer: No Typology Code available for payment source | Source: Ambulatory Visit | Attending: Internal Medicine | Admitting: Internal Medicine

## 2013-06-06 ENCOUNTER — Encounter (HOSPITAL_COMMUNITY): Payer: Self-pay

## 2013-06-06 ENCOUNTER — Other Ambulatory Visit (HOSPITAL_BASED_OUTPATIENT_CLINIC_OR_DEPARTMENT_OTHER): Payer: No Typology Code available for payment source

## 2013-06-06 DIAGNOSIS — R918 Other nonspecific abnormal finding of lung field: Secondary | ICD-10-CM | POA: Insufficient documentation

## 2013-06-06 DIAGNOSIS — Z90710 Acquired absence of both cervix and uterus: Secondary | ICD-10-CM | POA: Insufficient documentation

## 2013-06-06 DIAGNOSIS — C8589 Other specified types of non-Hodgkin lymphoma, extranodal and solid organ sites: Secondary | ICD-10-CM | POA: Insufficient documentation

## 2013-06-06 DIAGNOSIS — I700 Atherosclerosis of aorta: Secondary | ICD-10-CM | POA: Insufficient documentation

## 2013-06-06 DIAGNOSIS — J90 Pleural effusion, not elsewhere classified: Secondary | ICD-10-CM | POA: Insufficient documentation

## 2013-06-06 DIAGNOSIS — C8290 Follicular lymphoma, unspecified, unspecified site: Secondary | ICD-10-CM

## 2013-06-06 DIAGNOSIS — M47812 Spondylosis without myelopathy or radiculopathy, cervical region: Secondary | ICD-10-CM | POA: Insufficient documentation

## 2013-06-06 DIAGNOSIS — K5730 Diverticulosis of large intestine without perforation or abscess without bleeding: Secondary | ICD-10-CM | POA: Insufficient documentation

## 2013-06-06 DIAGNOSIS — I517 Cardiomegaly: Secondary | ICD-10-CM | POA: Insufficient documentation

## 2013-06-06 DIAGNOSIS — C8590 Non-Hodgkin lymphoma, unspecified, unspecified site: Secondary | ICD-10-CM

## 2013-06-06 DIAGNOSIS — C8291 Follicular lymphoma, unspecified, lymph nodes of head, face, and neck: Secondary | ICD-10-CM

## 2013-06-06 DIAGNOSIS — K449 Diaphragmatic hernia without obstruction or gangrene: Secondary | ICD-10-CM | POA: Insufficient documentation

## 2013-06-06 DIAGNOSIS — M47817 Spondylosis without myelopathy or radiculopathy, lumbosacral region: Secondary | ICD-10-CM | POA: Insufficient documentation

## 2013-06-06 LAB — CBC WITH DIFFERENTIAL/PLATELET
BASO%: 2.5 % — ABNORMAL HIGH (ref 0.0–2.0)
HGB: 10.8 g/dL — ABNORMAL LOW (ref 11.6–15.9)
LYMPH%: 20.9 % (ref 14.0–49.7)
MCH: 26.9 pg (ref 25.1–34.0)
MCHC: 32.5 g/dL (ref 31.5–36.0)
MCV: 82.7 fL (ref 79.5–101.0)
MONO%: 11 % (ref 0.0–14.0)
Platelets: 321 10*3/uL (ref 145–400)
RBC: 4 10*6/uL (ref 3.70–5.45)

## 2013-06-06 LAB — COMPREHENSIVE METABOLIC PANEL (CC13)
ALT: 18 U/L (ref 0–55)
AST: 27 U/L (ref 5–34)
Anion Gap: 11 mEq/L (ref 3–11)
BUN: 16.6 mg/dL (ref 7.0–26.0)
CO2: 24 mEq/L (ref 22–29)
Creatinine: 0.8 mg/dL (ref 0.6–1.1)
Total Bilirubin: 0.32 mg/dL (ref 0.20–1.20)

## 2013-06-06 LAB — LACTATE DEHYDROGENASE (CC13): LDH: 223 U/L (ref 125–245)

## 2013-06-06 MED ORDER — IOHEXOL 300 MG/ML  SOLN
100.0000 mL | Freq: Once | INTRAMUSCULAR | Status: AC | PRN
  Administered 2013-06-06: 100 mL via INTRAVENOUS

## 2013-06-07 ENCOUNTER — Ambulatory Visit (INDEPENDENT_AMBULATORY_CARE_PROVIDER_SITE_OTHER): Payer: No Typology Code available for payment source | Admitting: Family

## 2013-06-07 ENCOUNTER — Telehealth: Payer: Self-pay | Admitting: *Deleted

## 2013-06-07 DIAGNOSIS — M810 Age-related osteoporosis without current pathological fracture: Secondary | ICD-10-CM

## 2013-06-07 MED ORDER — DENOSUMAB 60 MG/ML ~~LOC~~ SOLN
60.0000 mg | Freq: Once | SUBCUTANEOUS | Status: AC
  Administered 2013-06-07: 60 mg via SUBCUTANEOUS

## 2013-06-07 NOTE — Telephone Encounter (Signed)
Yes please add caltrate + D 600mg  twice daily.

## 2013-06-07 NOTE — Telephone Encounter (Signed)
Notified pt and she voices understanding. 

## 2013-06-07 NOTE — Telephone Encounter (Signed)
Pt came in for prolia injection today. She is not currently taking calcium or vitamin d supplement.  Should she start taking one or both of these?

## 2013-06-08 ENCOUNTER — Encounter: Payer: Self-pay | Admitting: Internal Medicine

## 2013-06-08 ENCOUNTER — Ambulatory Visit (HOSPITAL_BASED_OUTPATIENT_CLINIC_OR_DEPARTMENT_OTHER): Payer: No Typology Code available for payment source | Admitting: Internal Medicine

## 2013-06-08 ENCOUNTER — Ambulatory Visit (HOSPITAL_BASED_OUTPATIENT_CLINIC_OR_DEPARTMENT_OTHER): Payer: No Typology Code available for payment source

## 2013-06-08 ENCOUNTER — Other Ambulatory Visit: Payer: Self-pay | Admitting: *Deleted

## 2013-06-08 ENCOUNTER — Encounter (INDEPENDENT_AMBULATORY_CARE_PROVIDER_SITE_OTHER): Payer: Self-pay

## 2013-06-08 ENCOUNTER — Other Ambulatory Visit: Payer: No Typology Code available for payment source | Admitting: Lab

## 2013-06-08 VITALS — BP 131/79 | HR 81 | Temp 97.5°F | Resp 18 | Ht 65.0 in | Wt 147.1 lb

## 2013-06-08 VITALS — BP 108/66 | HR 60 | Temp 97.6°F | Resp 18

## 2013-06-08 DIAGNOSIS — C8291 Follicular lymphoma, unspecified, lymph nodes of head, face, and neck: Secondary | ICD-10-CM

## 2013-06-08 DIAGNOSIS — Z5112 Encounter for antineoplastic immunotherapy: Secondary | ICD-10-CM

## 2013-06-08 DIAGNOSIS — C8590 Non-Hodgkin lymphoma, unspecified, unspecified site: Secondary | ICD-10-CM

## 2013-06-08 MED ORDER — DIPHENHYDRAMINE HCL 25 MG PO CAPS
ORAL_CAPSULE | ORAL | Status: AC
  Filled 2013-06-08: qty 2

## 2013-06-08 MED ORDER — ACETAMINOPHEN 325 MG PO TABS
650.0000 mg | ORAL_TABLET | Freq: Once | ORAL | Status: AC
  Administered 2013-06-08: 650 mg via ORAL

## 2013-06-08 MED ORDER — ACETAMINOPHEN 325 MG PO TABS
ORAL_TABLET | ORAL | Status: AC
  Filled 2013-06-08: qty 2

## 2013-06-08 MED ORDER — SODIUM CHLORIDE 0.9 % IV SOLN
375.0000 mg/m2 | Freq: Once | INTRAVENOUS | Status: AC
  Administered 2013-06-08: 600 mg via INTRAVENOUS
  Filled 2013-06-08: qty 60

## 2013-06-08 MED ORDER — DIPHENHYDRAMINE HCL 25 MG PO CAPS
50.0000 mg | ORAL_CAPSULE | Freq: Once | ORAL | Status: AC
  Administered 2013-06-08: 50 mg via ORAL

## 2013-06-08 MED ORDER — SODIUM CHLORIDE 0.9 % IV SOLN
Freq: Once | INTRAVENOUS | Status: AC
  Administered 2013-06-08: 11:00:00 via INTRAVENOUS

## 2013-06-08 NOTE — Patient Instructions (Signed)
[facility] Discharge Instructions for Patients Receiving Chemotherapy  Today you received the following chemotherapy agents; Rituxan.     BELOW ARE SYMPTOMS THAT SHOULD BE REPORTED IMMEDIATELY:  *FEVER GREATER THAN 100.5 F  *CHILLS WITH OR WITHOUT FEVER  NAUSEA AND VOMITING THAT IS NOT CONTROLLED WITH YOUR NAUSEA MEDICATION  *UNUSUAL SHORTNESS OF BREATH  *UNUSUAL BRUISING OR BLEEDING  TENDERNESS IN MOUTH AND THROAT WITH OR WITHOUT PRESENCE OF ULCERS  *URINARY PROBLEMS  *BOWEL PROBLEMS  UNUSUAL RASH Items with * indicate a potential emergency and should be followed up as soon as possible.  Feel free to call the clinic you have any questions or concerns. The clinic phone number is [phone].    

## 2013-06-08 NOTE — Progress Notes (Signed)
[facility] [phone]   [phone]  OFFICE PROGRESS NOTE  Lemont Fillers., NP [address] [place] [address]  DIAGNOSIS: recurrent Non Hodgkin's lymphoma, follicular center cell type with a predominant follicular pattern.Grade (if applicable): Favor high grade (grade 3/3), initially diagnosed in November of 2012   PRIOR THERAPY:  1) Status post treatment with Rituxan weekly for 3 doses in addition to 3 tablets of Afinitor at [facility] in [place].  2) Status post surgical excision of the right neck mass under the care of Dr. Michaell Cowing on 10/28/2012.   CURRENT THERAPY: Maintenance Rituxan 375 mg/M2 every 2 months status post 2 cycles of his dose was given on 12/14/2012.  INTERVAL HISTORY: [name] 78 y.o. female returns to the clinic today for three-month followup visit. The patient is tolerating her current treatment with Rituxan fairly well. She denied having any significant nausea or vomiting. She has no fever or chills. The patient denied having any significant weight loss or night sweats. She has no chest pain, shortness ofreath, cough or hemoptysis. She had repeat CT scan of the neck, chest, abdomen and pelvis performed recently and she is here for evaluation and discussion of her scan results.   MEDICAL HISTORY: Past Medical History  Diagnosis Date  . Hypothyroidism   . Anemia     pt denies  . Osteoporosis   . Pleural effusion, bilateral     history of  . Pulmonary nodules     history of  . History of pancreatitis     pt denies  . Cystocele     history with rectocele, pt denies  . Scoliosis   . Varicose vein   . Non Hodgkin's lymphoma   . Acute bronchitis 10/09/2012  . Pacemaker 09/15/2011    Medtronic Revo  . Dysrhythmia   . Shortness of breath     with exertion  . GERD (gastroesophageal reflux disease)   . H/O hiatal hernia   . Arthritis   . Cataract     bil  . Hemorrhoid   .  Pericarditis   . Atrial fibrillation     ALLERGIES:  is allergic to amoxicillin; codeine; demerol; morphine and related; vicodin; and sulfonamide derivatives.  MEDICATIONS:  Current Outpatient Prescriptions  Medication Sig Dispense Refill  . acetaminophen (TYLENOL) 500 MG tablet Take 500 mg by mouth every 6 (six) hours as needed for pain.      [name] ALPRAZolam (XANAX) 0.25 MG tablet take 1 tablet by mouth once daily if needed for sleep OR anxiety  30 tablet  1  . amLODipine (NORVASC) 10 MG tablet take 1 tablet by mouth once daily  90 tablet  0  . aspirin 81 MG tablet Take 81 mg by mouth daily.      . Calcium Carbonate-Vitamin D (CALTRATE 600+D) 600-400 MG-UNIT per tablet Take 1 tablet by mouth 2 (two) times daily.      [name] denosumab (PROLIA) 60 MG/ML SOLN injection Inject 60 mg into the skin every 6 (six) months. Administer in upper arm, thigh, or abdomen      . gabapentin (NEURONTIN) 300 MG capsule Take 1 capsule (300 mg total) by mouth 2 (two) times daily.  60 capsule  3  . KRILL OIL OMEGA-3 PO Take 1 capsule by mouth daily.      [name] levothyroxine (SYNTHROID, LEVOTHROID) 100 MCG tablet take 1 tablet by mouth once daily  90 tablet  0  . omeprazole (  PRILOSEC) 40 MG capsule Take 1 capsule (40 mg total) by mouth daily.  180 capsule  1   No current facility-administered medications for this visit.    SURGICAL HISTORY:  Past Surgical History  Procedure Laterality Date  . Pacemaker insertion  09/15/11    MDT  . Tonsillectomy    . Breast surgery  07/30/01    biopsy x 3, bilateral  . Abdominal hysterectomy  04/29/04    partial  . Mass excision Right 10/28/2012    Procedure: Removal of mass on neck       ;  Surgeon: Ardeth Sportsman, MD;  Location: [facility];  Service: General;  Laterality: Right;    REVIEW OF SYSTEMS:  Constitutional: negative Eyes: negative Ears, nose, mouth, throat, and face: negative Respiratory: negative Cardiovascular: negative Gastrointestinal:  negative Genitourinary:negative Integument/breast: negative Hematologic/lymphatic: negative Musculoskeletal:negative Neurological: negative Behavioral/Psych: negative Endocrine: negative Allergic/Immunologic: negative   PHYSICAL EXAMINATION: General appearance: alert, cooperative and no distress Head: Normocephalic, without obvious abnormality, atraumatic Neck: no adenopathy, no JVD, supple, symmetrical, trachea midline and thyroid not enlarged, symmetric, no tenderness/mass/nodules Lymph nodes: Cervical, supraclavicular, and axillary nodes normal. Resp: clear to auscultation bilaterally Back: symmetric, no curvature. ROM normal. No CVA tenderness. Cardio: regular rate and rhythm, S1, S2 normal, no murmur, click, rub or gallop GI: soft, non-tender; bowel sounds normal; no masses,  no organomegaly Extremities: extremities normal, atraumatic, no cyanosis or edema Neurologic: Alert and oriented X 3, normal strength and tone. Normal symmetric reflexes. Normal coordination and gait  ECOG PERFORMANCE STATUS: 0 - Asymptomatic  Blood pressure 131/79, pulse 81, temperature 97.5 F (36.4 C), temperature source Oral, resp. rate 18, height 5\' 5"  (1.651 m), weight 147 lb 1.6 oz (66.724 kg), SpO2 96.00%.  LABORATORY DATA: Lab Results  Component Value Date   WBC 6.5 06/06/2013   HGB 10.8* 06/06/2013   HCT 33.1* 06/06/2013   MCV 82.7 06/06/2013   PLT 321 06/06/2013      Chemistry      Component Value Date/Time   NA 143 06/06/2013 0901   NA 140 12/06/2012 1649   K 4.4 06/06/2013 0901   K 3.9 12/06/2012 1649   CL 109* 12/14/2012 1009   CL 108 12/06/2012 1649   CO2 24 06/06/2013 0901   CO2 27 12/06/2012 1649   BUN 16.6 06/06/2013 0901   BUN 16 12/06/2012 1649   CREATININE 0.8 06/06/2013 0901   CREATININE 0.8 12/06/2012 1649   CREATININE 0.75 10/06/2012 1202      Component Value Date/Time   CALCIUM 9.5 06/06/2013 0901   CALCIUM 9.1 12/06/2012 1649   ALKPHOS 76 06/06/2013 0901   ALKPHOS 77 10/06/2012  1202   AST 27 06/06/2013 0901   AST 28 10/06/2012 1202   ALT 18 06/06/2013 0901   ALT 19 10/06/2012 1202   BILITOT 0.32 06/06/2013 0901   BILITOT 0.3 10/06/2012 1202       RADIOGRAPHIC STUDIES: Ct Soft Tissue Neck W Contrast  06/06/2013   CLINICAL DATA:  Followup lymphoma  EXAM: CT NECK, CHEST, ABDOMEN AND PELVIS WITH CONTRAST  TECHNIQUE: Multidetector CT imaging of the neck, chest, abdomen and pelvis was performed during intravenous contrast administration.  CONTRAST:  100 cc of Omni 300  COMPARISON:  PET-CT from 11/16/2012.  IMPRESSION:   CT neck:  1. No evidence for mass or adenopathy. CT chest:  1. No evidence for mass or adenopathy. 2. Stable small nonspecific pulmonary nodules in both lungs. CT abdomen and pelvis:  1. No mass or  adenopathy identified. 2. Stable mild increased caliber of the CBD.  CT NECK FINDINGS: No enlarged lymph nodes are mass is identified within the soft tissues of the neck. Thyroid gland appears normal. The vascular structures of the neck are unremarkable. Normal appearance of the submandibular and parotid glands. The visualized intracranial contents appear normal. The visualized portions of the paranasal sinuses and mastoid air cells are clear.  Review of the visualized osseous structures is significant for cervical spondylosis.    CT CHEST:  Small left pleural effusion is noted. Multiple small pulmonary nodules are again noted. Index nodule in the right upper lobe is stable measuring 4 mm, image 21/series 7. Index right lower lobe nodule is stable measuring 6 mm. 4 mm left upper lobe nodule is unchanged from previous exam, image 18/series 7.  Moderate cardiac enlargement. No pericardial effusion identified. There is no enlarged mediastinal or hilar lymph nodes identified. There is a left chest wall pacer device with leads in the right atrial appendage and right ventricle. No enlarged axillary or supraclavicular lymph nodes.  Review of the visualized osseous structures is  significant for a scoliosis deformity involving the thoracic spine. This is convex towards the left.    CT ABDOMEN AND PELVIS:  Low-attenuation structure within the right hepatic lobe measures 4 mm. This is too small to characterize but is unchanged from previous exam. Anterior hypodensity within the liver measures 6 mm, image 44/series 2. This is also unchanged from previous exam. The gallbladder appears normal. Normal appearance of the pancreas. Mild increase caliber of the common bile duct which measures 7 mm. No obstructing stone or mass noted. The spleen is normal measuring 9 mm.  Normal appearance of the right kidney. There is a small hypodensity within the upper pole the left kidney measuring 3 mm. This is too small to characterize. The kidneys are both unremarkable. The urinary bladder appears normal. Previous hysterectomy.  There is calcified atherosclerotic disease affecting the abdominal aorta. No aneurysm. There is no pelvic or inguinal adenopathy identified.  The patient has a moderate size hiatal hernia. No stress set the stomach is otherwise normal. The small bowel loops have a normal course and caliber without obstruction. Normal appearance of the appendix. The proximal colon appears normal. Are multiple distal colonic diverticula without acute inflammation.  No free fluid or fluid collections identified.  Review of the visualized osseous structures is significant for lumbar spondylosis and multilevel degenerative disc disease. No aggressive lytic or sclerotic bone lesions.   Electronically Signed   By: Signa Kell M.D.   On: 06/06/2013 13:13     ASSESSMENT AND PLAN: This is a very pleasant 78 years old white female with recurrent non-Hodgkin's lymphoma, follicular center cell type currently on maintenance treatment with single agent Rituxan status post 3 cycles. The patient is tolerating her treatment fairly well with no significant adverse effects. Her recent CT scan of the chest, abdomen  and pelvis showed no evidence for disease recurrence. I discussed the scan results with the patient today. I recommended for her to proceed with cycle #4 today as scheduled. She would come back for followup visit in 2 months with the next cycle of her treatment.  The patient voices understanding of current disease status and treatment options and is in agreement with the current care plan.  All questions were answered. The patient knows to call the clinic with any problems, questions or concerns. We can certainly see the patient much sooner if necessary.  I spent 15  minutes counseling the patient face to face. The total time spent in the appointment was 25 minutes.

## 2013-06-09 ENCOUNTER — Telehealth: Payer: Self-pay | Admitting: Internal Medicine

## 2013-06-09 NOTE — Telephone Encounter (Signed)
s.w. pt and advised on Feb 2015 appt...pt ok and aware °

## 2013-06-14 ENCOUNTER — Ambulatory Visit (INDEPENDENT_AMBULATORY_CARE_PROVIDER_SITE_OTHER): Payer: No Typology Code available for payment source | Admitting: *Deleted

## 2013-06-14 DIAGNOSIS — I498 Other specified cardiac arrhythmias: Secondary | ICD-10-CM

## 2013-06-14 LAB — MDC_IDC_ENUM_SESS_TYPE_INCLINIC
Battery Voltage: 3 V
Brady Statistic AP VP Percent: 0.74 %
Brady Statistic AP VS Percent: 90.47 %
Brady Statistic AS VP Percent: 0.02 %
Brady Statistic AS VS Percent: 8.77 %
Date Time Interrogation Session: 20141217110724
Lead Channel Impedance Value: 360 Ohm
Lead Channel Impedance Value: 632 Ohm
Lead Channel Pacing Threshold Amplitude: 1 V
Lead Channel Pacing Threshold Pulse Width: 0.4 ms
Lead Channel Pacing Threshold Pulse Width: 0.8 ms
Lead Channel Sensing Intrinsic Amplitude: 13.6448
Lead Channel Setting Pacing Amplitude: 2 V
Lead Channel Setting Pacing Pulse Width: 0.8 ms
Zone Setting Detection Interval: 390 ms
Zone Setting Detection Interval: 400 ms

## 2013-06-14 LAB — PACEMAKER DEVICE OBSERVATION

## 2013-06-14 NOTE — Progress Notes (Signed)
Pacemaker check in clinic. Normal device function. Thresholds, sensing, impedances consistent with previous measurements. Device programmed to maximize longevity. No mode switch or high ventricular rates noted. Device programmed at appropriate safety margins. Histogram distribution somewhat blunted for her level of exercise.  Activity threshold reprogrammed med/low->low.   Device programmed to optimize intrinsic conduction. Patient education completed.  ROV 6 months with Dr. Royann Shivers.

## 2013-06-24 ENCOUNTER — Other Ambulatory Visit: Payer: Self-pay | Admitting: Family

## 2013-07-06 ENCOUNTER — Other Ambulatory Visit: Payer: Self-pay | Admitting: Family

## 2013-07-06 ENCOUNTER — Other Ambulatory Visit: Payer: Self-pay | Admitting: Family Medicine

## 2013-07-07 NOTE — Telephone Encounter (Signed)
Informed patient of medication refill and she scheduled appointment for late January.

## 2013-07-07 NOTE — Telephone Encounter (Signed)
Amlodipine refill sent.  Pt is past due for follow up of BP and thyroid.  Please call pt to arrange appt in the next 30 days.

## 2013-07-18 ENCOUNTER — Ambulatory Visit (HOSPITAL_BASED_OUTPATIENT_CLINIC_OR_DEPARTMENT_OTHER)
Admission: RE | Admit: 2013-07-18 | Discharge: 2013-07-18 | Disposition: A | Discharge status: Home / Self Care | Payer: No Typology Code available for payment source | Source: Ambulatory Visit | Attending: Family | Admitting: Family

## 2013-07-18 ENCOUNTER — Encounter: Payer: Self-pay | Admitting: Family

## 2013-07-18 ENCOUNTER — Ambulatory Visit (INDEPENDENT_AMBULATORY_CARE_PROVIDER_SITE_OTHER): Payer: No Typology Code available for payment source | Admitting: Family

## 2013-07-18 ENCOUNTER — Telehealth: Payer: Self-pay | Admitting: Family

## 2013-07-18 VITALS — BP 110/74 | HR 76 | Temp 98.0°F | Resp 16 | Ht 64.0 in | Wt 148.0 lb

## 2013-07-18 DIAGNOSIS — M4120 Other idiopathic scoliosis, site unspecified: Secondary | ICD-10-CM | POA: Insufficient documentation

## 2013-07-18 DIAGNOSIS — R32 Unspecified urinary incontinence: Secondary | ICD-10-CM

## 2013-07-18 DIAGNOSIS — K449 Diaphragmatic hernia without obstruction or gangrene: Secondary | ICD-10-CM | POA: Insufficient documentation

## 2013-07-18 DIAGNOSIS — J189 Pneumonia, unspecified organism: Secondary | ICD-10-CM | POA: Insufficient documentation

## 2013-07-18 DIAGNOSIS — I517 Cardiomegaly: Secondary | ICD-10-CM | POA: Insufficient documentation

## 2013-07-18 DIAGNOSIS — R3129 Other microscopic hematuria: Secondary | ICD-10-CM

## 2013-07-18 DIAGNOSIS — R0989 Other specified symptoms and signs involving the circulatory and respiratory systems: Secondary | ICD-10-CM | POA: Insufficient documentation

## 2013-07-18 LAB — POCT URINALYSIS DIPSTICK
Bilirubin, UA: NEGATIVE
Glucose, UA: NEGATIVE
Ketones, UA: NEGATIVE
LEUKOCYTES UA: NEGATIVE
NITRITE UA: NEGATIVE
PH UA: 6.5
PROTEIN UA: NEGATIVE
Spec Grav, UA: <=1.005
Urobilinogen, UA: 0.2

## 2013-07-18 MED ORDER — MOXIFLOXACIN HCL 400 MG PO TABS: 400.0000 mg | ORAL_TABLET | Freq: Every day | ORAL | Status: DC

## 2013-07-18 NOTE — Patient Instructions (Signed)
Please complete chest x ray on the first floor.  

## 2013-07-18 NOTE — Progress Notes (Signed)
Subjective:    Patient ID: [name], female    DOB: [birth year], 79 y.o.   MRN: [id number]  HPI  [name] is a 79 yr old female who presents today with report of fever. She developed coughing/congestion while on vacation in the [place]  Was hospitalized overnight in [place] on 12/31. She was told that she had pneumonia. She was treated with antibiotics.  She was only treated for 1 day. Then left the hospital because it was "filthy." reports that she is very sleepy. Has some shortness of breath. Has not been taking her temperature at home. Denies significant headache.  Using mucinex with slight improvement.  Mucous is yellow and green and tastes "awful."     Review of Systems    see HPI  Past Medical History  Diagnosis Date  . Hypothyroidism   . Anemia     pt denies  . Osteoporosis   . Pleural effusion, bilateral     history of  . Pulmonary nodules     history of  . History of pancreatitis     pt denies  . Cystocele     history with rectocele, pt denies  . Scoliosis   . Varicose vein   . Non Hodgkin's lymphoma   . Acute bronchitis 10/09/2012  . Pacemaker 09/15/2011    Medtronic Revo  . Dysrhythmia   . Shortness of breath     with exertion  . GERD (gastroesophageal reflux disease)   . H/O hiatal hernia   . Arthritis   . Cataract     bil  . Hemorrhoid   . Pericarditis   . Atrial fibrillation     History   Social History  . Marital Status: Widowed    Spouse Name: N/A    Number of Children: 3  . Years of Education: N/A   Occupational History  . self employed    Social History Main Topics  . Smoking status: Never Smoker   . Smokeless tobacco: Never Used  . Alcohol Use: Yes     Comment: 1 glass of wine occasionally  . Drug Use: No  . Sexual Activity: Not on file   Other Topics Concern  . 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  She lives  alone in a 3-story home.  There is a lift chair which she does not use (placed for her husband).    Past Surgical History  Procedure Laterality Date  . Pacemaker insertion  09/15/11    MDT  . Tonsillectomy    . Breast surgery  07/30/01    biopsy x 3, bilateral  . Abdominal hysterectomy  04/29/04    partial  . Mass excision Right 10/28/2012    Procedure: Removal of mass on neck       ;  Surgeon: Adin Hector, MD;  Location: [facility];  Service: General;  Laterality: Right;    Family History  Problem Relation Age of Onset  . Heart disease Daughter     congenital "whole in her heart"  . Stroke Mother   . Lymphoma Daughter     nonhodgkins  . Stroke Father   . Breast cancer Sister   . Heart disease Brother     Allergies  Allergen Reactions  . Amoxicillin Hives    Hives  . Codeine Hives    REACTION: "makes her crazy" Hives  . Demerol Hives  .  Morphine And Related Hives  . Vicodin [Hydrocodone-Acetaminophen] Hives  . Sulfonamide Derivatives Rash    REACTION: Rash    Current Outpatient Prescriptions on File Prior to Visit  Medication Sig Dispense Refill  . acetaminophen (TYLENOL) 500 MG tablet Take 500 mg by mouth every 6 (six) hours as needed for pain.      [name] ALPRAZolam (XANAX) 0.25 MG tablet take 1 tablet by mouth once daily if needed for sleep OR anxiety  30 tablet  1  . amLODipine (NORVASC) 10 MG tablet take 1 tablet by mouth once daily (DUE FOR FOLLOW-UP OFFICE VISIT)  90 tablet  0  . aspirin 81 MG tablet Take 81 mg by mouth daily.      . Calcium Carbonate-Vitamin D (CALTRATE 600+D) 600-400 MG-UNIT per tablet Take 1 tablet by mouth 2 (two) times daily.      [name] denosumab (PROLIA) 60 MG/ML SOLN injection Inject 60 mg into the skin every 6 (six) months. Administer in upper arm, thigh, or abdomen      . gabapentin (NEURONTIN) 300 MG capsule Take 1 capsule (300 mg total) by mouth 2 (two) times daily.  60 capsule  3  . KRILL OIL OMEGA-3 PO Take 1 capsule by mouth daily.      [name]  levothyroxine (SYNTHROID, LEVOTHROID) 100 MCG tablet take 1 tablet by mouth once daily  90 tablet  0  . omeprazole (PRILOSEC) 40 MG capsule Take 1 capsule (40 mg total) by mouth daily.  180 capsule  1   No current facility-administered medications on file prior to visit.    BP 110/74  Pulse 76  Temp(Src) 98 F (36.7 C) (Oral)  Resp 16  Ht 5\' 4"  (1.626 m)  Wt 148 lb 0.6 oz (67.151 kg)  BMI 25.40 kg/m2  SpO2 97%     Objective:   Physical Exam  Constitutional: She is oriented to person, place, and time. She appears well-developed and well-nourished. No distress.  HENT:  Head: Normocephalic and atraumatic.  Right Ear: Tympanic membrane and ear canal normal.  Left Ear: Tympanic membrane and ear canal normal.  Mouth/Throat: No oropharyngeal exudate, posterior oropharyngeal edema or posterior oropharyngeal erythema.  Cardiovascular: Normal rate and regular rhythm.   No murmur heard. Pulmonary/Chest: Effort normal. She has rales in the left upper field.  Neurological: She is alert and oriented to person, place, and time.  Skin: Skin is warm and dry.  Psychiatric: She has a normal mood and affect. Her behavior is normal. Judgment and thought content normal.          Assessment & Plan:

## 2013-07-18 NOTE — Telephone Encounter (Signed)
Advised pt re: PNA on CXR and to start avelox.  Advised her to follow up in 1 week, sooner if problems/concerns. Pt verbalizes understanding.

## 2013-07-18 NOTE — Assessment & Plan Note (Signed)
CXR and exam consistent with left sided pneumonia. Will rx with avelox. Follow up in 1 week, sooner if symptoms worsen or do not improve.

## 2013-07-18 NOTE — Progress Notes (Signed)
Pre visit review using our clinic review tool, if applicable. No additional management support is needed unless otherwise documented below in the visit note. 

## 2013-07-20 LAB — URINE CULTURE
Colony Count: NO GROWTH
Organism ID, Bacteria: NO GROWTH

## 2013-07-21 ENCOUNTER — Ambulatory Visit: Payer: No Typology Code available for payment source | Admitting: Family

## 2013-07-25 ENCOUNTER — Ambulatory Visit: Payer: No Typology Code available for payment source | Admitting: Family

## 2013-08-01 ENCOUNTER — Ambulatory Visit: Payer: No Typology Code available for payment source | Admitting: Family

## 2013-08-02 ENCOUNTER — Telehealth: Payer: Self-pay | Admitting: *Deleted

## 2013-08-02 NOTE — Telephone Encounter (Signed)
Patient called and canceled appts for tomorrow. Patient sick with the flu. Appts canceled and desk RN notified.  JMW

## 2013-08-03 ENCOUNTER — Ambulatory Visit: Payer: No Typology Code available for payment source | Admitting: Physician Assistant

## 2013-08-03 ENCOUNTER — Other Ambulatory Visit: Payer: No Typology Code available for payment source

## 2013-08-03 ENCOUNTER — Ambulatory Visit: Payer: No Typology Code available for payment source

## 2013-08-07 ENCOUNTER — Ambulatory Visit (INDEPENDENT_AMBULATORY_CARE_PROVIDER_SITE_OTHER): Payer: No Typology Code available for payment source | Admitting: Family

## 2013-08-07 ENCOUNTER — Encounter: Payer: Self-pay | Admitting: Family

## 2013-08-07 VITALS — BP 111/76 | HR 67 | Temp 98.0°F | Resp 16 | Ht 64.0 in | Wt 154.0 lb

## 2013-08-07 DIAGNOSIS — R1032 Left lower quadrant pain: Secondary | ICD-10-CM

## 2013-08-07 LAB — BASIC METABOLIC PANEL
BUN: 15 mg/dL (ref 6–23)
CALCIUM: 9.8 mg/dL (ref 8.4–10.5)
CO2: 28 meq/L (ref 19–32)
Chloride: 103 mEq/L (ref 96–112)
Creat: 0.69 mg/dL (ref 0.50–1.10)
GLUCOSE: 77 mg/dL (ref 70–99)
Potassium: 4.6 mEq/L (ref 3.5–5.3)
Sodium: 140 mEq/L (ref 135–145)

## 2013-08-07 MED ORDER — ALPRAZOLAM 0.5 MG PO TABS: ORAL_TABLET | ORAL | Status: DC

## 2013-08-07 NOTE — Patient Instructions (Signed)
Complete lab work prior to leaving. Complete CT scan downstairs prior to leaving. Start Xanax 0.5mg  tablets as needed at night for sleep.

## 2013-08-07 NOTE — Progress Notes (Signed)
Pre visit review using our clinic review tool, if applicable. No additional management support is needed unless otherwise documented below in the visit note. 

## 2013-08-07 NOTE — Assessment & Plan Note (Signed)
Increase Xanax to 0.5mg  as needed for anxiety/sleep.

## 2013-08-07 NOTE — Progress Notes (Signed)
Subjective:    Patient ID: [name], female    DOB: 15-Jul-1934, 79 y.o.   MRN: [id number]  Flank Pain Pertinent negatives include no abdominal pain, dysuria, fever or headaches.   [name] is a 79 year old female who presents today with a chief complaint of left side pain, near pelvic bone, without radiation, intermittently x six months. Reports pain is neither better or worse with any intervention. Patient has been taking Tylenol, gabapentin, and mobic without relief. Denies dysuria, frequency, urgency, constipation, nausea.   Patient also requesting medication refills and reports Xanax 0.25mg  is not helping patient to relax or sleep for several months now.    Review of Systems  Constitutional: Negative for fever and chills.  Gastrointestinal: Negative for nausea, vomiting, abdominal pain, diarrhea and constipation.  Genitourinary: Positive for flank pain. Negative for dysuria, urgency, frequency and difficulty urinating.  Musculoskeletal: Negative for myalgias.  Neurological: Negative for dizziness and headaches.  Psychiatric/Behavioral: Positive for sleep disturbance.       Patient usually takes Xanax but reports it's no longer working.   Past Medical History  Diagnosis Date  . Hypothyroidism   . Anemia     pt denies  . Osteoporosis   . Pleural effusion, bilateral     history of  . Pulmonary nodules     history of  . History of pancreatitis     pt denies  . Cystocele     history with rectocele, pt denies  . Scoliosis   . Varicose vein   . Non Hodgkin's lymphoma   . Acute bronchitis 10/09/2012  . Pacemaker 09/15/2011    Medtronic Revo  . Dysrhythmia   . Shortness of breath     with exertion  . GERD (gastroesophageal reflux disease)   . H/O hiatal hernia   . Arthritis   . Cataract     bil  . Hemorrhoid   . Pericarditis   . Atrial fibrillation     History   Social History  . Marital Status: Widowed    Spouse Name: N/A    Number of Children: 3  .  Years of Education: N/A   Occupational History  . self employed    Social History Main Topics  . Smoking status: Never Smoker   . Smokeless tobacco: Never Used  . Alcohol Use: Yes     Comment: 1 glass of wine occasionally  . Drug Use: No  . Sexual Activity: Not on file   Other Topics Concern  . 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  She lives alone in a 3-story home.  There is a lift chair which she does not use (placed for her husband).    Past Surgical History  Procedure Laterality Date  . Pacemaker insertion  09/15/11    MDT  . Tonsillectomy    . Breast surgery  07/30/01    biopsy x 3, bilateral  . Abdominal hysterectomy  04/29/04    partial  . Mass excision Right 10/28/2012    Procedure: Removal of mass on neck       ;  Surgeon: Adin Hector, MD;  Location: [facility];  Service: General;  Laterality: Right;    Family History  Problem Relation Age of Onset  . Heart disease Daughter     congenital "whole in her heart"  . Stroke Mother   . Lymphoma Daughter  nonhodgkins  . Stroke Father   . Breast cancer Sister   . Heart disease Brother     Allergies  Allergen Reactions  . Amoxicillin Hives    Hives  . Codeine Hives    REACTION: "makes her crazy" Hives  . Demerol Hives  . Morphine And Related Hives  . Vicodin [Hydrocodone-Acetaminophen] Hives  . Sulfonamide Derivatives Rash    REACTION: Rash    Current Outpatient Prescriptions on File Prior to Visit  Medication Sig Dispense Refill  . acetaminophen (TYLENOL) 500 MG tablet Take 500 mg by mouth every 6 (six) hours as needed for pain.      [name] amLODipine (NORVASC) 10 MG tablet take 1 tablet by mouth once daily (DUE FOR FOLLOW-UP OFFICE VISIT)  90 tablet  0  . aspirin 81 MG tablet Take 81 mg by mouth daily.      . Calcium Carbonate-Vitamin D (CALTRATE 600+D) 600-400 MG-UNIT per tablet Take 1 tablet by mouth 2 (two)  times daily.      [name] denosumab (PROLIA) 60 MG/ML SOLN injection Inject 60 mg into the skin every 6 (six) months. Administer in upper arm, thigh, or abdomen      . gabapentin (NEURONTIN) 300 MG capsule Take 1 capsule (300 mg total) by mouth 2 (two) times daily.  60 capsule  3  . KRILL OIL OMEGA-3 PO Take 1 capsule by mouth daily.      [name] levothyroxine (SYNTHROID, LEVOTHROID) 100 MCG tablet take 1 tablet by mouth once daily  90 tablet  0  . moxifloxacin (AVELOX) 400 MG tablet Take 1 tablet (400 mg total) by mouth daily.  7 tablet  0  . omeprazole (PRILOSEC) 40 MG capsule Take 1 capsule (40 mg total) by mouth daily.  180 capsule  1   No current facility-administered medications on file prior to visit.    BP 111/76  Pulse 67  Temp(Src) 98 F (36.7 C) (Oral)  Resp 16  Ht 5\' 4"  (1.626 m)  Wt 154 lb 0.6 oz (69.872 kg)  BMI 26.43 kg/m2  SpO2 97%       Objective:   Physical Exam  Constitutional: She is oriented to person, place, and time. She appears well-nourished.  HENT:  Head: Normocephalic.  Cardiovascular: Normal rate and regular rhythm.   Pulmonary/Chest: Effort normal and breath sounds normal. No respiratory distress.  Abdominal: Soft. Bowel sounds are normal. She exhibits no distension.  Mild LLQ tenderness to palpation without guarding.   Neurological: She is alert and oriented to person, place, and time.  Skin: Skin is warm and dry.  Psychiatric: She has a normal mood and affect.          Assessment & Plan:  I have personally seen and examined patient and agree with Erica Friendly NP student's assessment and plan.

## 2013-08-07 NOTE — Assessment & Plan Note (Signed)
Obtain urinalysis to rule out urinary tract infection. Obtain CT abdomen/pelvis with contrast. Will obtain BMET prior.

## 2013-08-08 ENCOUNTER — Encounter: Payer: Self-pay | Admitting: Family

## 2013-08-08 LAB — URINALYSIS, ROUTINE W REFLEX MICROSCOPIC
Bilirubin Urine: NEGATIVE
Glucose, UA: NEGATIVE mg/dL
HGB URINE DIPSTICK: NEGATIVE
KETONES UR: NEGATIVE mg/dL
LEUKOCYTES UA: NEGATIVE
Nitrite: NEGATIVE
PH: 6.5 (ref 5.0–8.0)
PROTEIN: NEGATIVE mg/dL
Specific Gravity, Urine: 1.016 (ref 1.005–1.030)
Urobilinogen, UA: 0.2 mg/dL (ref 0.0–1.0)

## 2013-08-08 LAB — URINE CULTURE
Colony Count: NO GROWTH
Organism ID, Bacteria: NO GROWTH

## 2013-08-09 ENCOUNTER — Telehealth: Payer: Self-pay | Admitting: Internal Medicine

## 2013-08-09 NOTE — Telephone Encounter (Signed)
returned pt's call re r/s 2/5 appts. pt given new appt for lb/AJ/tx 2/16 @ 9:15am.

## 2013-08-10 ENCOUNTER — Telehealth: Payer: Self-pay | Admitting: Family

## 2013-08-10 NOTE — Telephone Encounter (Signed)
Was seen on Monday and now is coughing until she throws up, can something be call in? Please advise

## 2013-08-10 NOTE — Telephone Encounter (Signed)
Please call pt and advise her to try delsym OTC for cough.  She should be re-evaluated in the office for cough.

## 2013-08-11 ENCOUNTER — Ambulatory Visit (INDEPENDENT_AMBULATORY_CARE_PROVIDER_SITE_OTHER): Payer: Medicare Other | Admitting: Physician Assistant

## 2013-08-11 ENCOUNTER — Encounter: Payer: Self-pay | Admitting: Physician Assistant

## 2013-08-11 VITALS — BP 100/80 | HR 93 | Temp 98.2°F | Wt 143.0 lb

## 2013-08-11 DIAGNOSIS — B9689 Other specified bacterial agents as the cause of diseases classified elsewhere: Secondary | ICD-10-CM

## 2013-08-11 DIAGNOSIS — J329 Chronic sinusitis, unspecified: Secondary | ICD-10-CM

## 2013-08-11 DIAGNOSIS — A499 Bacterial infection, unspecified: Secondary | ICD-10-CM

## 2013-08-11 MED ORDER — AZITHROMYCIN 500 MG PO TABS: 500.0000 mg | ORAL_TABLET | Freq: Every day | ORAL | Status: DC

## 2013-08-11 MED ORDER — PROMETHAZINE-DM 6.25-15 MG/5ML PO SYRP: 5.0000 mL | ORAL_SOLUTION | Freq: Four times a day (QID) | ORAL | Status: DC | PRN

## 2013-08-11 NOTE — Telephone Encounter (Signed)
Pt voiced understanding

## 2013-08-11 NOTE — Progress Notes (Signed)
Subjective:    Patient ID: [name], female    DOB: Jul 02, 1934, 79 y.o.   MRN: [id number]  HPI Comments: Patient is a 79 year old female who presents to the office with complaint of cough and shortness of breath. Reports recent history of pneumonia in December 2014. States she was treated and was feeling better however in last 10 days has had runny nose with cough that started out dry and has become productive. Feels out of breath when trying to cough. Felt she was wheezing the other day has since resolved. Has tried Mucinex and alka seltzer sinus and cold with little relief of symptoms. Denies chest pain/palpitations, chest tightness, ear pain, painful or difficulty swallowing, facial pressure, myalgias, history of allergies or asthma.   Past Medical History  Diagnosis Date  . Hypothyroidism   . Anemia     pt denies  . Osteoporosis   . Pleural effusion, bilateral     history of  . Pulmonary nodules     history of  . History of pancreatitis     pt denies  . Cystocele     history with rectocele, pt denies  . Scoliosis   . Varicose vein   . Non Hodgkin's lymphoma   . Acute bronchitis 10/09/2012  . Pacemaker 09/15/2011    Medtronic Revo  . Dysrhythmia   . Shortness of breath     with exertion  . GERD (gastroesophageal reflux disease)   . H/O hiatal hernia   . Arthritis   . Cataract     bil  . Hemorrhoid   . Pericarditis   . Atrial fibrillation       Review of Systems  Constitutional: Negative for fever, appetite change and fatigue.  HENT: Positive for congestion and rhinorrhea. Negative for ear pain, postnasal drip, sinus pressure, sore throat and trouble swallowing.   Respiratory: Positive for cough, shortness of breath and wheezing (resolved now). Negative for chest tightness.   Cardiovascular: Negative for chest pain and palpitations.  Gastrointestinal: Negative for nausea and vomiting.  Neurological: Negative for headaches.  All other systems reviewed and are  negative.       Objective:   Physical Exam  Vitals reviewed. Constitutional: She is oriented to person, place, and time. She appears well-developed and well-nourished. No distress.  HENT:  Head: Normocephalic and atraumatic.  Right Ear: Tympanic membrane, external ear and ear canal normal.  Left Ear: Tympanic membrane, external ear and ear canal normal.  Nose: Rhinorrhea present. Right sinus exhibits no maxillary sinus tenderness and no frontal sinus tenderness. Left sinus exhibits no maxillary sinus tenderness and no frontal sinus tenderness.  Mouth/Throat: Uvula is midline and mucous membranes are normal. No oropharyngeal exudate or tonsillar abscesses.  Turbinates slightly edematous and erythematous, clear PND noted, no exudate  Eyes: Conjunctivae are normal.  Neck: Normal range of motion.  Cardiovascular: Normal rate, regular rhythm and normal heart sounds.  Exam reveals no gallop and no friction rub.   No murmur heard. Pulmonary/Chest: Effort normal and breath sounds normal. She has no wheezes. She has no rhonchi. She has no rales.  Musculoskeletal: Normal range of motion.  Lymphadenopathy:    She has no cervical adenopathy.  Neurological: She is alert and oriented to person, place, and time.  Skin: Skin is warm and dry.  Psychiatric: She has a normal mood and affect.   Filed Vitals:   08/11/13 1405  BP: 100/80  Pulse: 93  Temp: 98.2 F (36.8 C)  Assessment & Plan:   Bacterial sinusitis: Duration greater than 7 days with no relief from OTC medications, Rx for azithromax  500 mg one tab daily Rx for Promethazine for cough relief. Patient counseled to increase fluids and rest, RTO if no improvement of symptoms

## 2013-08-11 NOTE — Patient Instructions (Signed)
It was great to meet you today [name]!   I have sent prescriptions to your pharmacy.    Cough, Adult  A cough is a reflex. It helps you clear your throat and airways. A cough can help heal your body. A cough can last 2 or 3 weeks (acute) or may last more than 8 weeks (chronic). Some common causes of a cough can include an infection, allergy, or a cold. HOME CARE  Only take medicine as told by your doctor.  If given, take your medicines (antibiotics) as told. Finish them even if you start to feel better.  Use a cold steam vaporizer or humidier in your home. This can help loosen thick spit (secretions).  Sleep so you are almost sitting up (semi-upright). Use pillows to do this. This helps reduce coughing.  Rest as needed.  Stop smoking if you smoke. GET HELP RIGHT AWAY IF:  You have yellowish-white fluid (pus) in your thick spit.  Your cough gets worse.  Your medicine does not reduce coughing, and you are losing sleep.  You cough up blood.  You have trouble breathing.  Your pain gets worse and medicine does not help.  You have a fever. MAKE SURE YOU:   Understand these instructions.  Will watch your condition.  Will get help right away if you are not doing well or get worse. Document Released: 02/26/2011 Document Revised: 09/07/2011 Document Reviewed: 02/26/2011 [facility] Patient Information 2014 [place].   Sinusitis Sinusitis is redness, soreness, and puffiness (inflammation) of the air pockets in the bones of your face (sinuses). The redness, soreness, and puffiness can cause air and mucus to get trapped in your sinuses. This can allow germs to grow and cause an infection.  HOME CARE   Drink enough fluids to keep your pee (urine) clear or pale yellow.  Use a humidifier in your home.  Run a hot shower to create steam in the bathroom. Sit in the bathroom with the door closed. Breathe in the steam 3 4 times a day.  Put a warm, moist washcloth on your  face 3 4 times a day, or as told by your doctor.  Use salt water sprays (saline sprays) to wet the thick fluid in your nose. This can help the sinuses drain.  Only take medicine as told by your doctor. GET HELP RIGHT AWAY IF:   Your pain gets worse.  You have very bad headaches.  You are sick to your stomach (nauseous).  You throw up (vomit).  You are very sleepy (drowsy) all the time.  Your face is puffy (swollen).  Your vision changes.  You have a stiff neck.  You have trouble breathing. MAKE SURE YOU:   Understand these instructions.  Will watch your condition.  Will get help right away if you are not doing well or get worse. Document Released: 12/02/2007 Document Revised: 03/09/2012 Document Reviewed: 01/19/2012 [facility] Patient Information 2014 [place].

## 2013-08-11 NOTE — Progress Notes (Signed)
Pre visit review using our clinic review tool, if applicable. No additional management support is needed unless otherwise documented below in the visit note. 

## 2013-08-14 ENCOUNTER — Ambulatory Visit: Payer: No Typology Code available for payment source

## 2013-08-14 ENCOUNTER — Other Ambulatory Visit: Payer: Self-pay | Admitting: *Deleted

## 2013-08-14 ENCOUNTER — Ambulatory Visit: Payer: No Typology Code available for payment source | Admitting: Physician Assistant

## 2013-08-14 ENCOUNTER — Other Ambulatory Visit: Payer: No Typology Code available for payment source

## 2013-08-16 ENCOUNTER — Telehealth: Payer: Self-pay | Admitting: Internal Medicine

## 2013-08-16 ENCOUNTER — Telehealth: Payer: Self-pay | Admitting: *Deleted

## 2013-08-16 NOTE — Telephone Encounter (Signed)
Per staff message and POF I have scheduled appts.  JMW  

## 2013-08-16 NOTE — Telephone Encounter (Signed)
lvm fo rpt regarding to Feb appt....mailed pt appt sched/avs and letter °

## 2013-08-18 ENCOUNTER — Ambulatory Visit: Payer: No Typology Code available for payment source | Admitting: Physician Assistant

## 2013-08-18 ENCOUNTER — Other Ambulatory Visit: Payer: No Typology Code available for payment source

## 2013-08-23 ENCOUNTER — Ambulatory Visit (HOSPITAL_BASED_OUTPATIENT_CLINIC_OR_DEPARTMENT_OTHER): Payer: No Typology Code available for payment source | Admitting: Physician Assistant

## 2013-08-23 ENCOUNTER — Other Ambulatory Visit: Payer: No Typology Code available for payment source

## 2013-08-23 ENCOUNTER — Ambulatory Visit: Payer: No Typology Code available for payment source | Admitting: Physician Assistant

## 2013-08-23 ENCOUNTER — Encounter: Payer: Self-pay | Admitting: Physician Assistant

## 2013-08-23 ENCOUNTER — Other Ambulatory Visit (HOSPITAL_BASED_OUTPATIENT_CLINIC_OR_DEPARTMENT_OTHER): Payer: Medicare Other

## 2013-08-23 ENCOUNTER — Ambulatory Visit (HOSPITAL_BASED_OUTPATIENT_CLINIC_OR_DEPARTMENT_OTHER): Payer: Medicare Other

## 2013-08-23 ENCOUNTER — Telehealth: Payer: Self-pay | Admitting: Internal Medicine

## 2013-08-23 VITALS — BP 116/64 | HR 69 | Temp 97.7°F | Resp 18

## 2013-08-23 VITALS — BP 134/73 | HR 91 | Temp 98.3°F | Resp 20 | Ht 64.0 in | Wt 143.4 lb

## 2013-08-23 DIAGNOSIS — C8590 Non-Hodgkin lymphoma, unspecified, unspecified site: Secondary | ICD-10-CM

## 2013-08-23 DIAGNOSIS — C8291 Follicular lymphoma, unspecified, lymph nodes of head, face, and neck: Secondary | ICD-10-CM

## 2013-08-23 DIAGNOSIS — Z5112 Encounter for antineoplastic immunotherapy: Secondary | ICD-10-CM

## 2013-08-23 DIAGNOSIS — C8290 Follicular lymphoma, unspecified, unspecified site: Secondary | ICD-10-CM

## 2013-08-23 LAB — CBC WITH DIFFERENTIAL/PLATELET
BASO%: 1.4 % (ref 0.0–2.0)
Basophils Absolute: 0.1 10e3/uL (ref 0.0–0.1)
EOS%: 4.3 % (ref 0.0–7.0)
Eosinophils Absolute: 0.4 10e3/uL (ref 0.0–0.5)
HCT: 44.4 % (ref 34.8–46.6)
HGB: 14.7 g/dL (ref 11.6–15.9)
LYMPH%: 24.5 % (ref 14.0–49.7)
MCH: 29.7 pg (ref 25.1–34.0)
MCHC: 33.1 g/dL (ref 31.5–36.0)
MCV: 89.7 fL (ref 79.5–101.0)
MONO#: 1.1 10e3/uL — ABNORMAL HIGH (ref 0.1–0.9)
MONO%: 11.3 % (ref 0.0–14.0)
NEUT#: 5.4 10e3/uL (ref 1.5–6.5)
NEUT%: 58.5 % (ref 38.4–76.8)
Platelets: 283 10e3/uL (ref 145–400)
RBC: 4.95 10e6/uL (ref 3.70–5.45)
RDW: 20.3 % — ABNORMAL HIGH (ref 11.2–14.5)
WBC: 9.3 10e3/uL (ref 3.9–10.3)
lymph#: 2.3 10e3/uL (ref 0.9–3.3)
nRBC: 0 % (ref 0–0)

## 2013-08-23 LAB — COMPREHENSIVE METABOLIC PANEL (CC13)
ALT: 25 U/L (ref 0–55)
AST: 28 U/L (ref 5–34)
Albumin: 4.3 g/dL (ref 3.5–5.0)
Alkaline Phosphatase: 88 U/L (ref 40–150)
Anion Gap: 11 mEq/L (ref 3–11)
BUN: 14.9 mg/dL (ref 7.0–26.0)
CHLORIDE: 107 meq/L (ref 98–109)
CO2: 24 mEq/L (ref 22–29)
CREATININE: 0.8 mg/dL (ref 0.6–1.1)
Calcium: 9.8 mg/dL (ref 8.4–10.4)
Glucose: 85 mg/dl (ref 70–140)
Potassium: 3.8 mEq/L (ref 3.5–5.1)
Sodium: 141 mEq/L (ref 136–145)
Total Bilirubin: 0.5 mg/dL (ref 0.20–1.20)
Total Protein: 7.5 g/dL (ref 6.4–8.3)

## 2013-08-23 LAB — LACTATE DEHYDROGENASE (CC13): LDH: 246 U/L — AB (ref 125–245)

## 2013-08-23 MED ORDER — ACETAMINOPHEN 325 MG PO TABS
ORAL_TABLET | ORAL | Status: AC
  Filled 2013-08-23: qty 2

## 2013-08-23 MED ORDER — DIPHENHYDRAMINE HCL 25 MG PO CAPS
ORAL_CAPSULE | ORAL | Status: AC
  Filled 2013-08-23: qty 2

## 2013-08-23 MED ORDER — ACETAMINOPHEN 325 MG PO TABS
650.0000 mg | ORAL_TABLET | Freq: Once | ORAL | Status: AC
  Administered 2013-08-23: 650 mg via ORAL

## 2013-08-23 MED ORDER — DIPHENHYDRAMINE HCL 25 MG PO CAPS
50.0000 mg | ORAL_CAPSULE | Freq: Once | ORAL | Status: AC
  Administered 2013-08-23: 50 mg via ORAL

## 2013-08-23 MED ORDER — SODIUM CHLORIDE 0.9 % IV SOLN
375.0000 mg/m2 | Freq: Once | INTRAVENOUS | Status: AC
  Administered 2013-08-23: 600 mg via INTRAVENOUS
  Filled 2013-08-23: qty 60

## 2013-08-23 MED ORDER — SODIUM CHLORIDE 0.9 % IV SOLN
Freq: Once | INTRAVENOUS | Status: AC
  Administered 2013-08-23: 11:00:00 via INTRAVENOUS

## 2013-08-23 NOTE — Patient Instructions (Signed)
[facility] Discharge Instructions for Patients Receiving Chemotherapy  Today you received the following chemotherapy agent: Rituxan   To help prevent nausea and vomiting after your treatment, we encourage you to take your nausea medication as prescribed.    If you develop nausea and vomiting that is not controlled by your nausea medication, call the clinic.   BELOW ARE SYMPTOMS THAT SHOULD BE REPORTED IMMEDIATELY:  *FEVER GREATER THAN 100.5 F  *CHILLS WITH OR WITHOUT FEVER  NAUSEA AND VOMITING THAT IS NOT CONTROLLED WITH YOUR NAUSEA MEDICATION  *UNUSUAL SHORTNESS OF BREATH  *UNUSUAL BRUISING OR BLEEDING  TENDERNESS IN MOUTH AND THROAT WITH OR WITHOUT PRESENCE OF ULCERS  *URINARY PROBLEMS  *BOWEL PROBLEMS  UNUSUAL RASH Items with * indicate a potential emergency and should be followed up as soon as possible.  Feel free to call the clinic you have any questions or concerns. The clinic phone number is [phone].

## 2013-08-23 NOTE — Progress Notes (Addendum)
[place] [phone]   [phone]  SHARED VISIT PROGRESS NOTE  [name], NP [address] [place] [id number]  DIAGNOSIS: recurrent Non Hodgkin's lymphoma, follicular center cell type with a predominant follicular pattern.Grade (if applicable): Favor high grade (grade 3/3), initially diagnosed in November of 2012   PRIOR THERAPY:  1) Status post treatment with Rituxan weekly for 3 doses in addition to 3 tablets of Afinitor at [facility] in [place].  2) Status post surgical excision of the right neck mass under the care of Dr. Johney Maine on 10/28/2012.   CURRENT THERAPY: Maintenance Rituxan 375 mg/M2 every 2 months, status post 4 cycles. First cycle given 12/14/2012.  INTERVAL HISTORY: [name] 79 y.o. female returns to the clinic today for a two-month followup visit. The patient is tolerating her current treatment with maintenance Rituxan fairly well. She denied having any significant nausea or vomiting. She has no fever or chills. The patient denied having any significant weight loss or night sweats. She reports that she does have sweats but states that this has been a long-standing issue and she believes is more related to her hormonal status. She has no chest pain, shortness ofreath, cough or hemoptysis. She states that she had bilateral cataract surgery in January 2015 with lens implantation. Since that time she is notice that her eyelids have been "drooping". She would like to have surgery to correct her eyelids.   MEDICAL HISTORY: Past Medical History  Diagnosis Date  . Hypothyroidism   . Anemia     pt denies  . Osteoporosis   . Pleural effusion, bilateral     history of  . Pulmonary nodules     history of  . History of pancreatitis     pt denies  . Cystocele     history with rectocele, pt denies  . Scoliosis   . Varicose vein   . Non Hodgkin's lymphoma   . Acute bronchitis 10/09/2012  .  Pacemaker 09/15/2011    Medtronic Revo  . Dysrhythmia   . Shortness of breath     with exertion  . GERD (gastroesophageal reflux disease)   . H/O hiatal hernia   . Arthritis   . Cataract     bil  . Hemorrhoid   . Pericarditis   . Atrial fibrillation     ALLERGIES:  is allergic to amoxicillin; codeine; demerol; morphine and related; vicodin; and sulfonamide derivatives.  MEDICATIONS:  Current Outpatient Prescriptions  Medication Sig Dispense Refill  . acetaminophen (TYLENOL) 500 MG tablet Take 500 mg by mouth every 6 (six) hours as needed for pain.      [name] ALPRAZolam (XANAX) 0.5 MG tablet take 1 tablet by mouth once daily if needed for sleep OR anxiety  30 tablet  0  . amLODipine (NORVASC) 10 MG tablet take 1 tablet by mouth once daily (DUE FOR FOLLOW-UP OFFICE VISIT)  90 tablet  0  . aspirin 81 MG tablet Take 81 mg by mouth daily.      . Calcium Carbonate-Vitamin D (CALTRATE 600+D) 600-400 MG-UNIT per tablet Take 1 tablet by mouth 2 (two) times daily.      [name] gabapentin (NEURONTIN) 300 MG capsule Take 1 capsule (300 mg total) by mouth 2 (two) times daily.  60 capsule  3  . KRILL OIL OMEGA-3 PO Take 1 capsule by mouth daily.      [name] levothyroxine (SYNTHROID, LEVOTHROID) 100 MCG tablet take 1 tablet  by mouth once daily  90 tablet  0  . omeprazole (PRILOSEC) 40 MG capsule Take 1 capsule (40 mg total) by mouth daily.  180 capsule  1  . denosumab (PROLIA) 60 MG/ML SOLN injection Inject 60 mg into the skin every 6 (six) months. Administer in upper arm, thigh, or abdomen       No current facility-administered medications for this visit.    SURGICAL HISTORY:  Past Surgical History  Procedure Laterality Date  . Pacemaker insertion  09/15/11    MDT  . Tonsillectomy    . Breast surgery  07/30/01    biopsy x 3, bilateral  . Abdominal hysterectomy  04/29/04    partial  . Mass excision Right 10/28/2012    Procedure: Removal of mass on neck       ;  Surgeon: Adin Hector, MD;  Location: [facility];   Service: General;  Laterality: Right;    REVIEW OF SYSTEMS:  Constitutional: negative Eyes: positive for cataracts and status post bilateral cataract surgery January 2015, now wanting to have eyelid repair surgery Ears, nose, mouth, throat, and face: negative Respiratory: negative Cardiovascular: negative Gastrointestinal: negative Genitourinary:negative Integument/breast: negative Hematologic/lymphatic: negative Musculoskeletal:negative Neurological: negative Behavioral/Psych: negative Endocrine: negative Allergic/Immunologic: negative   PHYSICAL EXAMINATION: General appearance: alert, cooperative and no distress Head: Normocephalic, without obvious abnormality, atraumatic Neck: no adenopathy, no JVD, supple, symmetrical, trachea midline and thyroid not enlarged, symmetric, no tenderness/mass/nodules Lymph nodes: Cervical, supraclavicular, and axillary nodes normal. Resp: clear to auscultation bilaterally Back: symmetric, no curvature. ROM normal. No CVA tenderness. Cardio: regular rate and rhythm, S1, S2 normal, no murmur, click, rub or gallop GI: soft, non-tender; bowel sounds normal; no masses,  no organomegaly Extremities: extremities normal, atraumatic, no cyanosis or edema Neurologic: Alert and oriented X 3, normal strength and tone. Normal symmetric reflexes. Normal coordination and gait Of note in the upper eyelid of the eyes has some excess skin that is lax bilaterally  ECOG PERFORMANCE STATUS: 0 - Asymptomatic  Blood pressure 134/73, pulse 91, temperature 98.3 F (36.8 C), temperature source Oral, resp. rate 20, height 5\' 4"  (1.626 m), weight 143 lb 6.4 oz (65.046 kg).  LABORATORY DATA: Lab Results  Component Value Date   WBC 9.3 08/23/2013   HGB 14.7 08/23/2013   HCT 44.4 08/23/2013   MCV 89.7 08/23/2013   PLT 283 08/23/2013      Chemistry      Component Value Date/Time   NA 140 08/07/2013 1149   NA 143 06/06/2013 0901   K 4.6 08/07/2013 1149   K 4.4 06/06/2013  0901   CL 103 08/07/2013 1149   CL 109* 12/14/2012 1009   CO2 28 08/07/2013 1149   CO2 24 06/06/2013 0901   BUN 15 08/07/2013 1149   BUN 16.6 06/06/2013 0901   CREATININE 0.69 08/07/2013 1149   CREATININE 0.8 06/06/2013 0901   CREATININE 0.8 12/06/2012 1649      Component Value Date/Time   CALCIUM 9.8 08/07/2013 1149   CALCIUM 9.5 06/06/2013 0901   ALKPHOS 76 06/06/2013 0901   ALKPHOS 77 10/06/2012 1202   AST 27 06/06/2013 0901   AST 28 10/06/2012 1202   ALT 18 06/06/2013 0901   ALT 19 10/06/2012 1202   BILITOT 0.32 06/06/2013 0901   BILITOT 0.3 10/06/2012 1202       RADIOGRAPHIC STUDIES: Ct Soft Tissue Neck W Contrast  06/06/2013   CLINICAL DATA:  Followup lymphoma  EXAM: CT NECK, CHEST, ABDOMEN AND PELVIS WITH CONTRAST  TECHNIQUE: Multidetector CT imaging of the neck, chest, abdomen and pelvis was performed during intravenous contrast administration.  CONTRAST:  100 cc of Omni 300  COMPARISON:  PET-CT from 11/16/2012.  IMPRESSION:   CT neck:  1. No evidence for mass or adenopathy. CT chest:  1. No evidence for mass or adenopathy. 2. Stable small nonspecific pulmonary nodules in both lungs. CT abdomen and pelvis:  1. No mass or adenopathy identified. 2. Stable mild increased caliber of the CBD.  CT NECK FINDINGS: No enlarged lymph nodes are mass is identified within the soft tissues of the neck. Thyroid gland appears normal. The vascular structures of the neck are unremarkable. Normal appearance of the submandibular and parotid glands. The visualized intracranial contents appear normal. The visualized portions of the paranasal sinuses and mastoid air cells are clear.  Review of the visualized osseous structures is significant for cervical spondylosis.    CT CHEST:  Small left pleural effusion is noted. Multiple small pulmonary nodules are again noted. Index nodule in the right upper lobe is stable measuring 4 mm, image 21/series 7. Index right lower lobe nodule is stable measuring 6 mm. 4 mm left upper lobe  nodule is unchanged from previous exam, image 18/series 7.  Moderate cardiac enlargement. No pericardial effusion identified. There is no enlarged mediastinal or hilar lymph nodes identified. There is a left chest wall pacer device with leads in the right atrial appendage and right ventricle. No enlarged axillary or supraclavicular lymph nodes.  Review of the visualized osseous structures is significant for a scoliosis deformity involving the thoracic spine. This is convex towards the left.    CT ABDOMEN AND PELVIS:  Low-attenuation structure within the right hepatic lobe measures 4 mm. This is too small to characterize but is unchanged from previous exam. Anterior hypodensity within the liver measures 6 mm, image 44/series 2. This is also unchanged from previous exam. The gallbladder appears normal. Normal appearance of the pancreas. Mild increase caliber of the common bile duct which measures 7 mm. No obstructing stone or mass noted. The spleen is normal measuring 9 mm.  Normal appearance of the right kidney. There is a small hypodensity within the upper pole the left kidney measuring 3 mm. This is too small to characterize. The kidneys are both unremarkable. The urinary bladder appears normal. Previous hysterectomy.  There is calcified atherosclerotic disease affecting the abdominal aorta. No aneurysm. There is no pelvic or inguinal adenopathy identified.  The patient has a moderate size hiatal hernia. No stress set the stomach is otherwise normal. The small bowel loops have a normal course and caliber without obstruction. Normal appearance of the appendix. The proximal colon appears normal. Are multiple distal colonic diverticula without acute inflammation.  No free fluid or fluid collections identified.  Review of the visualized osseous structures is significant for lumbar spondylosis and multilevel degenerative disc disease. No aggressive lytic or sclerotic bone lesions.   Electronically Signed   By: Kerby Moors M.D.   On: 06/06/2013 13:13     ASSESSMENT AND PLAN: This is a very pleasant 79 years old white female with recurrent non-Hodgkin's lymphoma, follicular center cell type currently on maintenance treatment with single agent Rituxan status post 4 cycles. The patient is tolerating her treatment fairly well with no significant adverse effects. Her last CT scan of the chest, abdomen and pelvis showed no evidence for disease recurrence. The patient was discussed with and also seen by Dr. Julien Nordmann. She will proceed with cycle #5 of  her maintenance Rituxan at 375 mg per meter squared today as scheduled. She'll return in 2 months prior to cycle #6 for another symptom management visit. She was advised that she could schedule her eyelid repair surgery he in the next 3-6 weeks.  The patient voices understanding of current disease status and treatment options and is in agreement with the current care plan.  All questions were answered. The patient knows to call the clinic with any problems, questions or concerns. We can certainly see the patient much sooner if necessary.  Carlton Adam PA-C  ADDENDUM: Hematology/Oncology Attending: I had a face to face encounter with the patient. I recommended her care plan. This is a very pleasant 79 years old white female with recurrent non-Hodgkin's lymphoma, follicular center cell type. She is currently on maintenance treatment with single agent Rituxan every 2 months is status post 4 cycles and she is tolerating her treatment fairly well. She had recent eye surgery that resulted drooping of the eyelids which may require plastic surgery. I explained to the patient that it would be okay for her to proceed with the plastic surgery few weeks after her treatment with Rituxan. She will proceed with cycle #5 today as scheduled. She would come back for followup visit in 2 weeks with the start of cycle #6. She was advised to call immediately if she has any concerning  symptoms in the interval.  Disclaimer: This note was dictated with voice recognition software. Similar sounding words can inadvertently be transcribed and may not be corrected upon review. Eilleen Kempf., MD 08/24/2013

## 2013-08-23 NOTE — Progress Notes (Signed)
1340: patient c/o nausea but attributes this to not having eaten anything recently. Rituxan infusion complete at 1335 without any complications. Food and drink given to patient and she agrees to remain under watch to ensure no additional s/sx occur. This RN to monitor.   1350: patient reports complete relief from her nausea and is ready to be discharged.

## 2013-08-23 NOTE — Telephone Encounter (Signed)
gv dna printed appt sched and avs forpt for April....sed added tx.

## 2013-08-24 NOTE — Patient Instructions (Signed)
Follow up in 2 months

## 2013-09-13 ENCOUNTER — Other Ambulatory Visit: Payer: Self-pay | Admitting: Family

## 2013-09-13 NOTE — Telephone Encounter (Signed)
Pt last seen by [place] in 07/2013 and has no future appts on file.  Please advise.  Medication name:  Name from pharmacy:  ALPRAZolam (XANAX) 0.5 MG tablet  ALPRAZOLAM 0.5 MG TABLET Sig: take 1 tablet by mouth once daily if needed for sleep Dispense: 30 tablet (Pharmacy requested 30) Refills: 0 Start: 09/13/2013 Class: Normal Requested on: 08/07/2013 Originally ordered on: 08/07/2013 Last refill: 08/07/2013

## 2013-09-14 NOTE — Telephone Encounter (Signed)
OK to send 30 tabs zero refills.  

## 2013-09-14 NOTE — Telephone Encounter (Signed)
Refill request for alprazolam Last filled by MD on- 08/07/2013 #30 x0 Last Appt: 07/18/2013 Next Appt: none Please advise refill?

## 2013-09-15 NOTE — Telephone Encounter (Signed)
Refill called to pharmacy voicemail. 

## 2013-09-15 NOTE — Telephone Encounter (Signed)
OK to send 30 tabs, zero refills.  

## 2013-09-24 ENCOUNTER — Other Ambulatory Visit: Payer: Self-pay | Admitting: Family

## 2013-10-06 ENCOUNTER — Other Ambulatory Visit: Payer: Self-pay | Admitting: Family

## 2013-10-06 ENCOUNTER — Other Ambulatory Visit: Payer: Self-pay | Admitting: Family Medicine

## 2013-10-18 ENCOUNTER — Other Ambulatory Visit (HOSPITAL_BASED_OUTPATIENT_CLINIC_OR_DEPARTMENT_OTHER): Payer: Medicare Other

## 2013-10-18 ENCOUNTER — Encounter: Payer: Self-pay | Admitting: Internal Medicine

## 2013-10-18 ENCOUNTER — Ambulatory Visit (HOSPITAL_BASED_OUTPATIENT_CLINIC_OR_DEPARTMENT_OTHER): Payer: Medicare Other

## 2013-10-18 ENCOUNTER — Ambulatory Visit (HOSPITAL_BASED_OUTPATIENT_CLINIC_OR_DEPARTMENT_OTHER): Payer: Medicare Other | Admitting: Internal Medicine

## 2013-10-18 VITALS — BP 100/65 | HR 67 | Temp 97.8°F | Resp 18

## 2013-10-18 VITALS — BP 110/77 | HR 62 | Temp 98.4°F | Resp 20 | Ht 64.0 in | Wt 136.4 lb

## 2013-10-18 DIAGNOSIS — C8590 Non-Hodgkin lymphoma, unspecified, unspecified site: Secondary | ICD-10-CM

## 2013-10-18 DIAGNOSIS — C8291 Follicular lymphoma, unspecified, lymph nodes of head, face, and neck: Secondary | ICD-10-CM

## 2013-10-18 DIAGNOSIS — C8290 Follicular lymphoma, unspecified, unspecified site: Secondary | ICD-10-CM

## 2013-10-18 DIAGNOSIS — Z5112 Encounter for antineoplastic immunotherapy: Secondary | ICD-10-CM

## 2013-10-18 LAB — CBC WITH DIFFERENTIAL/PLATELET
BASO%: 1.5 % (ref 0.0–2.0)
Basophils Absolute: 0.1 10*3/uL (ref 0.0–0.1)
EOS ABS: 0.2 10*3/uL (ref 0.0–0.5)
EOS%: 3.5 % (ref 0.0–7.0)
HEMATOCRIT: 46.3 % (ref 34.8–46.6)
HEMOGLOBIN: 15.7 g/dL (ref 11.6–15.9)
LYMPH#: 1.9 10*3/uL (ref 0.9–3.3)
LYMPH%: 31.6 % (ref 14.0–49.7)
MCH: 31.6 pg (ref 25.1–34.0)
MCHC: 33.9 g/dL (ref 31.5–36.0)
MCV: 93.2 fL (ref 79.5–101.0)
MONO#: 0.7 10*3/uL (ref 0.1–0.9)
MONO%: 11.3 % (ref 0.0–14.0)
NEUT%: 52.1 % (ref 38.4–76.8)
NEUTROS ABS: 3.2 10*3/uL (ref 1.5–6.5)
Platelets: 236 10*3/uL (ref 145–400)
RBC: 4.97 10*6/uL (ref 3.70–5.45)
RDW: 15.3 % — ABNORMAL HIGH (ref 11.2–14.5)
WBC: 6 10*3/uL (ref 3.9–10.3)
nRBC: 0 % (ref 0–0)

## 2013-10-18 LAB — COMPREHENSIVE METABOLIC PANEL (CC13)
ALBUMIN: 4 g/dL (ref 3.5–5.0)
ALT: 25 U/L (ref 0–55)
ANION GAP: 9 meq/L (ref 3–11)
AST: 30 U/L (ref 5–34)
Alkaline Phosphatase: 64 U/L (ref 40–150)
BILIRUBIN TOTAL: 0.47 mg/dL (ref 0.20–1.20)
BUN: 11.7 mg/dL (ref 7.0–26.0)
CALCIUM: 9.6 mg/dL (ref 8.4–10.4)
CHLORIDE: 109 meq/L (ref 98–109)
CO2: 25 mEq/L (ref 22–29)
Creatinine: 0.8 mg/dL (ref 0.6–1.1)
GLUCOSE: 105 mg/dL (ref 70–140)
Potassium: 3.7 mEq/L (ref 3.5–5.1)
SODIUM: 143 meq/L (ref 136–145)
TOTAL PROTEIN: 6.8 g/dL (ref 6.4–8.3)

## 2013-10-18 LAB — LACTATE DEHYDROGENASE (CC13): LDH: 291 U/L — ABNORMAL HIGH (ref 125–245)

## 2013-10-18 MED ORDER — ACETAMINOPHEN 325 MG PO TABS
ORAL_TABLET | ORAL | Status: AC
  Filled 2013-10-18: qty 2

## 2013-10-18 MED ORDER — ACETAMINOPHEN 325 MG PO TABS
650.0000 mg | ORAL_TABLET | Freq: Once | ORAL | Status: AC
  Administered 2013-10-18: 650 mg via ORAL

## 2013-10-18 MED ORDER — RITUXIMAB CHEMO INJECTION 10 MG/ML
375.0000 mg/m2 | Freq: Once | INTRAVENOUS | Status: AC
  Administered 2013-10-18: 600 mg via INTRAVENOUS
  Filled 2013-10-18: qty 60

## 2013-10-18 MED ORDER — SODIUM CHLORIDE 0.9 % IV SOLN
Freq: Once | INTRAVENOUS | Status: AC
Start: 2013-10-18 — End: 2013-10-18
  Administered 2013-10-18: 11:00:00 via INTRAVENOUS

## 2013-10-18 MED ORDER — DIPHENHYDRAMINE HCL 25 MG PO CAPS
50.0000 mg | ORAL_CAPSULE | Freq: Once | ORAL | Status: AC
  Administered 2013-10-18: 50 mg via ORAL

## 2013-10-18 MED ORDER — DIPHENHYDRAMINE HCL 25 MG PO CAPS
ORAL_CAPSULE | ORAL | Status: AC
  Filled 2013-10-18: qty 2

## 2013-10-18 NOTE — Progress Notes (Signed)
Labs within rituxan treatment parameters. Infusion tolerated well.

## 2013-10-18 NOTE — Patient Instructions (Addendum)
[facility] Discharge Instructions for Patients Receiving Chemotherapy  Today you received the following chemotherapy agents: Rituxan   To help prevent nausea and vomiting after your treatment, we encourage you to take your nausea medication as prescribed by your physician.    If you develop nausea and vomiting that is not controlled by your nausea medication, call the clinic.   BELOW ARE SYMPTOMS THAT SHOULD BE REPORTED IMMEDIATELY:  *FEVER GREATER THAN 100.5 F  *CHILLS WITH OR WITHOUT FEVER  NAUSEA AND VOMITING THAT IS NOT CONTROLLED WITH YOUR NAUSEA MEDICATION  *UNUSUAL SHORTNESS OF BREATH  *UNUSUAL BRUISING OR BLEEDING  TENDERNESS IN MOUTH AND THROAT WITH OR WITHOUT PRESENCE OF ULCERS  *URINARY PROBLEMS  *BOWEL PROBLEMS  UNUSUAL RASH Items with * indicate a potential emergency and should be followed up as soon as possible.  Feel free to call the clinic you have any questions or concerns. The clinic phone number is [phone].    

## 2013-10-18 NOTE — Progress Notes (Signed)
[place] [phone]   [phone]  OFFICE PROGRESS NOTE  [name], NP [place] [address]  DIAGNOSIS: recurrent Non Hodgkin's lymphoma, follicular center cell type with a predominant follicular pattern.Grade (if applicable): Favor high grade (grade 3/3), initially diagnosed in November of 2012   PRIOR THERAPY:  1) Status post treatment with Rituxan weekly for 3 doses in addition to 3 tablets of Afinitor at [facility] in [place].  2) Status post surgical excision of the right neck mass under the care of Dr. Johney Maine on 10/28/2012.   CURRENT THERAPY: Maintenance Rituxan 375 mg/M2 every 2 months status post 5 cycles of his dose was given on 12/14/2012.  INTERVAL HISTORY: [name] 79 y.o. female returns to the clinic today for three-month followup visit. The patient is tolerating her current treatment with Rituxan fairly well. She denied having any significant nausea or vomiting. She has been complaining of left hip pain recently. The patient was seen by her primary care physician and the expected to have imaging studies of this area in the next few weeks. She has no fever or chills. The patient denied having any significant weight loss or night sweats. She has no chest pain, shortness ofreath, cough or hemoptysis.   MEDICAL HISTORY: Past Medical History  Diagnosis Date  . Hypothyroidism   . Anemia     pt denies  . Osteoporosis   . Pleural effusion, bilateral     history of  . Pulmonary nodules     history of  . History of pancreatitis     pt denies  . Cystocele     history with rectocele, pt denies  . Scoliosis   . Varicose vein   . Non Hodgkin's lymphoma   . Acute bronchitis 10/09/2012  . Pacemaker 09/15/2011    Medtronic Revo  . Dysrhythmia   . Shortness of breath     with exertion  . GERD (gastroesophageal reflux disease)   . H/O hiatal hernia   . Arthritis   . Cataract    bil  . Hemorrhoid   . Pericarditis   . Atrial fibrillation     ALLERGIES:  is allergic to amoxicillin; codeine; demerol; morphine and related; vicodin; and sulfonamide derivatives.  MEDICATIONS:  Current Outpatient Prescriptions  Medication Sig Dispense Refill  . acetaminophen (TYLENOL) 500 MG tablet Take 500 mg by mouth every 6 (six) hours as needed for pain.      [name] ALPRAZolam (XANAX) 0.5 MG tablet take 1 tablet by mouth once daily if needed for sleep  30 tablet  0  . amLODipine (NORVASC) 10 MG tablet take 1 tablet by mouth once daily  DUE FOR A FOLLOW-UP OFFICE VISIT  90 tablet  1  . aspirin 81 MG tablet Take 81 mg by mouth daily.      . Calcium Carbonate-Vitamin D (CALTRATE 600+D) 600-400 MG-UNIT per tablet Take 1 tablet by mouth 2 (two) times daily.      [name] denosumab (PROLIA) 60 MG/ML SOLN injection Inject 60 mg into the skin every 6 (six) months. Administer in upper arm, thigh, or abdomen      . ferrous fumarate (HEMOCYTE - 106 MG FE) 325 (106 FE) MG TABS tablet Take 1 tablet by mouth.      . gabapentin (NEURONTIN) 100 MG capsule take 1 capsule by mouth three times a day  90 capsule  2  . KRILL OIL OMEGA-3 PO Take 1  capsule by mouth daily.      [name] levothyroxine (SYNTHROID, LEVOTHROID) 100 MCG tablet take 1 tablet by mouth once daily  90 tablet  1  . omeprazole (PRILOSEC) 40 MG capsule Take 1 capsule (40 mg total) by mouth daily.  180 capsule  1  . vitamin B-12 (CYANOCOBALAMIN) 100 MCG tablet Take 100 mcg by mouth daily.       No current facility-administered medications for this visit.    SURGICAL HISTORY:  Past Surgical History  Procedure Laterality Date  . Pacemaker insertion  09/15/11    MDT  . Tonsillectomy    . Breast surgery  07/30/01    biopsy x 3, bilateral  . Abdominal hysterectomy  04/29/04    partial  . Mass excision Right 10/28/2012    Procedure: Removal of mass on neck       ;  Surgeon: Adin Hector, MD;  Location: [facility];  Service: General;  Laterality: Right;     REVIEW OF SYSTEMS:  Constitutional: negative Eyes: negative Ears, nose, mouth, throat, and face: negative Respiratory: negative Cardiovascular: negative Gastrointestinal: negative Genitourinary:negative Integument/breast: negative Hematologic/lymphatic: negative Musculoskeletal:negative Neurological: negative Behavioral/Psych: negative Endocrine: negative Allergic/Immunologic: negative   PHYSICAL EXAMINATION: General appearance: alert, cooperative and no distress Head: Normocephalic, without obvious abnormality, atraumatic Neck: no adenopathy, no JVD, supple, symmetrical, trachea midline and thyroid not enlarged, symmetric, no tenderness/mass/nodules Lymph nodes: Cervical, supraclavicular, and axillary nodes normal. Resp: clear to auscultation bilaterally Back: symmetric, no curvature. ROM normal. No CVA tenderness. Cardio: regular rate and rhythm, S1, S2 normal, no murmur, click, rub or gallop GI: soft, non-tender; bowel sounds normal; no masses,  no organomegaly Extremities: extremities normal, atraumatic, no cyanosis or edema Neurologic: Alert and oriented X 3, normal strength and tone. Normal symmetric reflexes. Normal coordination and gait  ECOG PERFORMANCE STATUS: 0 - Asymptomatic  Blood pressure 110/77, pulse 62, temperature 98.4 F (36.9 C), temperature source Oral, resp. rate 20, height 5\' 4"  (1.626 m), weight 136 lb 6.4 oz (61.871 kg).  LABORATORY DATA: Lab Results  Component Value Date   WBC 6.0 10/18/2013   HGB 15.7 10/18/2013   HCT 46.3 10/18/2013   MCV 93.2 10/18/2013   PLT 236 10/18/2013      Chemistry      Component Value Date/Time   NA 141 08/23/2013 0916   NA 140 08/07/2013 1149   K 3.8 08/23/2013 0916   K 4.6 08/07/2013 1149   CL 103 08/07/2013 1149   CL 109* 12/14/2012 1009   CO2 24 08/23/2013 0916   CO2 28 08/07/2013 1149   BUN 14.9 08/23/2013 0916   BUN 15 08/07/2013 1149   CREATININE 0.8 08/23/2013 0916   CREATININE 0.69 08/07/2013 1149   CREATININE 0.8  12/06/2012 1649      Component Value Date/Time   CALCIUM 9.8 08/23/2013 0916   CALCIUM 9.8 08/07/2013 1149   ALKPHOS 88 08/23/2013 0916   ALKPHOS 77 10/06/2012 1202   AST 28 08/23/2013 0916   AST 28 10/06/2012 1202   ALT 25 08/23/2013 0916   ALT 19 10/06/2012 1202   BILITOT 0.50 08/23/2013 0916   BILITOT 0.3 10/06/2012 1202       RADIOGRAPHIC STUDIES:  ASSESSMENT AND PLAN: This is a very pleasant 79 years old white female with recurrent non-Hodgkin's lymphoma, follicular center cell type currently on maintenance treatment with single agent Rituxan status post 5 cycles. The patient is tolerating her treatment fairly well with no significant adverse effects. I recommended for her to  proceed with cycle #6 today as scheduled. She would come back for followup visit in 2 months with the next cycle of her treatment after repeating CT scan of the neck, chest, abdomen and pelvis for restaging of her disease. For the left hip pain, the patient may benefit from MRI of the left hip to rule out any arthritis or other abnormalities. She is followed by her primary care physician for this issue. The patient voices understanding of current disease status and treatment options and is in agreement with the current care plan.  All questions were answered. The patient knows to call the clinic with any problems, questions or concerns. We can certainly see the patient much sooner if necessary.  Disclaimer: This note was dictated with voice recognition software. Similar sounding words can inadvertently be transcribed and may not be corrected upon review.

## 2013-10-19 ENCOUNTER — Telehealth: Payer: Self-pay | Admitting: Internal Medicine

## 2013-10-19 NOTE — Telephone Encounter (Signed)
s.w. pt and advised on June and aug appt.pt ok and aware....sed added tx.

## 2013-10-30 ENCOUNTER — Telehealth: Payer: Self-pay | Admitting: *Deleted

## 2013-10-30 NOTE — Telephone Encounter (Signed)
Rose,  Can you initiate insurance verification for pt's Prolia due 12/06/13? Thank you!

## 2013-10-30 NOTE — Telephone Encounter (Signed)
Message copied by Ronny Flurry on Mon Oct 30, 2013  8:58 AM ------      Message from: Kelle Darting A      Created: Wed Jun 07, 2013  1:47 PM       Pt due for next Prolia around 12/06/13.  Call Ruby Mcclinton to initiate insurance verification in May 2015. ------

## 2013-10-30 NOTE — Telephone Encounter (Signed)
I have sent info to [employer] for insurance verification and will notify you as soon as I have a response from them. Thank you!

## 2013-11-01 ENCOUNTER — Telehealth: Payer: Self-pay | Admitting: Dietician

## 2013-11-01 NOTE — Telephone Encounter (Signed)
Brief Outpatient Oncology Nutrition Note  Patient has been identified to be at risk on malnutrition screen.  Wt Readings from Last 10 Encounters:  10/18/13 136 lb 6.4 oz (61.871 kg)  08/23/13 143 lb 6.4 oz (65.046 kg)  08/11/13 143 lb (64.864 kg)  08/07/13 154 lb 0.6 oz (69.872 kg)  07/18/13 148 lb 0.6 oz (67.151 kg)  06/08/13 147 lb 1.6 oz (66.724 kg)  04/27/13 145 lb 8 oz (65.998 kg)  04/10/13 143 lb 4.8 oz (65 kg)  03/09/13 143 lb (64.864 kg)  03/07/13 142 lb (64.411 kg)      Dx:  Non Hodgkin's Lymphoma.  Patient of Dr. Julien Nordmann.    Called patient due to weight loss.  Patient states that she is "eating like a pig".  Drinks the Special K protein shake.  Snacking throughout the day.    Discussed tips to increase calories in diet.  If weight loss continues recommended patient make an appointment to see the [facility].  Antonieta Iba, RD, LDN

## 2013-11-03 NOTE — Telephone Encounter (Signed)
I have received pt's Prolia insurance verification. Can you please give me your fax # so that I can fax the info over to you? Thank you.

## 2013-11-03 NOTE — Telephone Encounter (Signed)
[phone], Thanks.

## 2013-11-07 NOTE — Telephone Encounter (Signed)
Faxed info over to your attention and also sent copies to be scanned into pt's chart. Thank you.

## 2013-11-09 ENCOUNTER — Other Ambulatory Visit: Payer: Self-pay | Admitting: Family

## 2013-11-10 NOTE — Telephone Encounter (Signed)
Alprazolam, #[phone] x no refills called to [facility].

## 2013-11-14 ENCOUNTER — Other Ambulatory Visit: Payer: Self-pay | Admitting: Obstetrics and Gynecology

## 2013-12-01 NOTE — Telephone Encounter (Signed)
Notified pt. Scheduled follow up and prolia injection on 12/06/13 at 11am.

## 2013-12-06 ENCOUNTER — Ambulatory Visit (INDEPENDENT_AMBULATORY_CARE_PROVIDER_SITE_OTHER): Payer: Medicare Other | Admitting: Family

## 2013-12-06 ENCOUNTER — Encounter: Payer: Self-pay | Admitting: Family

## 2013-12-06 VITALS — BP 100/60 | HR 73 | Temp 97.9°F | Resp 16 | Ht 64.0 in | Wt 138.0 lb

## 2013-12-06 DIAGNOSIS — C8590 Non-Hodgkin lymphoma, unspecified, unspecified site: Secondary | ICD-10-CM

## 2013-12-06 DIAGNOSIS — I10 Essential (primary) hypertension: Secondary | ICD-10-CM

## 2013-12-06 DIAGNOSIS — E039 Hypothyroidism, unspecified: Secondary | ICD-10-CM

## 2013-12-06 DIAGNOSIS — M25559 Pain in unspecified hip: Secondary | ICD-10-CM

## 2013-12-06 DIAGNOSIS — M810 Age-related osteoporosis without current pathological fracture: Secondary | ICD-10-CM

## 2013-12-06 DIAGNOSIS — C8589 Other specified types of non-Hodgkin lymphoma, extranodal and solid organ sites: Secondary | ICD-10-CM

## 2013-12-06 MED ORDER — AMLODIPINE BESYLATE 5 MG PO TABS: 5.0000 mg | ORAL_TABLET | Freq: Every day | ORAL | Status: DC

## 2013-12-06 MED ORDER — ALPRAZOLAM 0.5 MG PO TABS: ORAL_TABLET | ORAL | Status: DC

## 2013-12-06 MED ORDER — DENOSUMAB 60 MG/ML ~~LOC~~ SOLN
60.0000 mg | Freq: Once | SUBCUTANEOUS | Status: AC
  Administered 2013-12-06: 60 mg via SUBCUTANEOUS

## 2013-12-06 NOTE — Progress Notes (Signed)
Pre visit review using our clinic review tool, if applicable. No additional management support is needed unless otherwise documented below in the visit note. 

## 2013-12-06 NOTE — Patient Instructions (Signed)
Decrease amlodipine from 10mg  to 5mg .   Complete lab work prior to leaving. Follow up in 1 month.

## 2013-12-06 NOTE — Assessment & Plan Note (Signed)
Prolia injection today. Will request dexa scan from her GYN.

## 2013-12-06 NOTE — Assessment & Plan Note (Signed)
Appears over treated on amlodipine 10mg . Will decrease to 5mg  once daily and have pt follow up in 1 month.

## 2013-12-06 NOTE — Assessment & Plan Note (Signed)
Mild, monitor for now. We discussed further imaging if symptoms worsen.

## 2013-12-06 NOTE — Assessment & Plan Note (Signed)
Clinically stable on synthroid. Obtain TSH.  

## 2013-12-06 NOTE — Progress Notes (Signed)
   Subjective:    Patient ID: [name], female    DOB: 06-05-1935, 79 y.o.   MRN: [id number]  HPI  Ms. [name] is a 79 yr old female who presents today for follow up of multiple medical problems:  1) Hypothyroid- on synthroid.  Lab Results  Component Value Date   TSH 2.238 10/06/2012   2) HTN-on amlodipine.  She denies CP/SOB or swelling.  BP Readings from Last 3 Encounters:  12/06/13 100/60  10/18/13 100/65  10/18/13 110/77   3) Osteoporosis-maintained on prolia.  Last Dexa- was 4 weeks ago and was performed by GYN.    4) Recurrent NHL- she is followed by Dr. Julien Nordmann (oncology). She is undergoing a Rituxan therapy. Last prolia injection was ?, picks up from [facility].  5) L hip pain- reports pain is occasion. Reports improving- not as bad as it was.   Review of Systems See HPI  Pmhx/Pshx/Pfamhx is reviewed      Objective:   Physical Exam  Constitutional: She is oriented to person, place, and time. She appears well-developed and well-nourished. No distress.  HENT:  Head: Normocephalic and atraumatic.  Cardiovascular: Normal rate and regular rhythm.   No murmur heard. Pulmonary/Chest: Effort normal and breath sounds normal. No respiratory distress. She has no wheezes. She has no rales. She exhibits no tenderness.  Neurological: She is alert and oriented to person, place, and time.  Skin: Skin is warm and dry.  Psychiatric: She has a normal mood and affect. Her behavior is normal. Judgment and thought content normal.          Assessment & Plan:

## 2013-12-06 NOTE — Assessment & Plan Note (Signed)
Management per oncology. 

## 2013-12-07 LAB — VITAMIN D 25 HYDROXY (VIT D DEFICIENCY, FRACTURES): VIT D 25 HYDROXY: 85 ng/mL (ref 30–89)

## 2013-12-07 LAB — TSH: TSH: 0.864 u[IU]/mL (ref 0.350–4.500)

## 2013-12-08 ENCOUNTER — Encounter: Payer: Self-pay | Admitting: Family

## 2013-12-11 ENCOUNTER — Encounter (HOSPITAL_COMMUNITY): Payer: Self-pay

## 2013-12-11 ENCOUNTER — Ambulatory Visit (HOSPITAL_COMMUNITY)
Admission: RE | Admit: 2013-12-11 | Discharge: 2013-12-11 | Disposition: A | Discharge status: Home / Self Care | Payer: Medicare Other | Source: Ambulatory Visit | Attending: Internal Medicine | Admitting: Internal Medicine

## 2013-12-11 ENCOUNTER — Other Ambulatory Visit (HOSPITAL_BASED_OUTPATIENT_CLINIC_OR_DEPARTMENT_OTHER): Payer: Medicare Other

## 2013-12-11 DIAGNOSIS — C8589 Other specified types of non-Hodgkin lymphoma, extranodal and solid organ sites: Secondary | ICD-10-CM | POA: Insufficient documentation

## 2013-12-11 DIAGNOSIS — Z950 Presence of cardiac pacemaker: Secondary | ICD-10-CM | POA: Insufficient documentation

## 2013-12-11 DIAGNOSIS — R918 Other nonspecific abnormal finding of lung field: Secondary | ICD-10-CM | POA: Insufficient documentation

## 2013-12-11 DIAGNOSIS — I517 Cardiomegaly: Secondary | ICD-10-CM | POA: Insufficient documentation

## 2013-12-11 DIAGNOSIS — K7689 Other specified diseases of liver: Secondary | ICD-10-CM | POA: Insufficient documentation

## 2013-12-11 DIAGNOSIS — C8590 Non-Hodgkin lymphoma, unspecified, unspecified site: Secondary | ICD-10-CM

## 2013-12-11 DIAGNOSIS — K449 Diaphragmatic hernia without obstruction or gangrene: Secondary | ICD-10-CM | POA: Insufficient documentation

## 2013-12-11 DIAGNOSIS — C8291 Follicular lymphoma, unspecified, lymph nodes of head, face, and neck: Secondary | ICD-10-CM

## 2013-12-11 DIAGNOSIS — E049 Nontoxic goiter, unspecified: Secondary | ICD-10-CM | POA: Insufficient documentation

## 2013-12-11 DIAGNOSIS — Z9221 Personal history of antineoplastic chemotherapy: Secondary | ICD-10-CM | POA: Insufficient documentation

## 2013-12-11 DIAGNOSIS — K5730 Diverticulosis of large intestine without perforation or abscess without bleeding: Secondary | ICD-10-CM | POA: Insufficient documentation

## 2013-12-11 LAB — CBC WITH DIFFERENTIAL/PLATELET
BASO%: 1.7 % (ref 0.0–2.0)
Basophils Absolute: 0.1 10*3/uL (ref 0.0–0.1)
EOS ABS: 0.3 10*3/uL (ref 0.0–0.5)
EOS%: 5.4 % (ref 0.0–7.0)
HCT: 45.3 % (ref 34.8–46.6)
HEMOGLOBIN: 15.4 g/dL (ref 11.6–15.9)
LYMPH%: 28.3 % (ref 14.0–49.7)
MCH: 32.7 pg (ref 25.1–34.0)
MCHC: 34 g/dL (ref 31.5–36.0)
MCV: 96.2 fL (ref 79.5–101.0)
MONO#: 0.6 10*3/uL (ref 0.1–0.9)
MONO%: 11.2 % (ref 0.0–14.0)
NEUT#: 2.9 10*3/uL (ref 1.5–6.5)
NEUT%: 53.4 % (ref 38.4–76.8)
Platelets: 233 10*3/uL (ref 145–400)
RBC: 4.71 10*6/uL (ref 3.70–5.45)
RDW: 14.4 % (ref 11.2–14.5)
WBC: 5.3 10*3/uL (ref 3.9–10.3)
lymph#: 1.5 10*3/uL (ref 0.9–3.3)

## 2013-12-11 LAB — COMPREHENSIVE METABOLIC PANEL (CC13)
ALBUMIN: 4.4 g/dL (ref 3.5–5.0)
ALK PHOS: 81 U/L (ref 40–150)
ALT: 28 U/L (ref 0–55)
AST: 38 U/L — AB (ref 5–34)
Anion Gap: 10 mEq/L (ref 3–11)
BUN: 11.9 mg/dL (ref 7.0–26.0)
CALCIUM: 10.3 mg/dL (ref 8.4–10.4)
CHLORIDE: 105 meq/L (ref 98–109)
CO2: 28 mEq/L (ref 22–29)
Creatinine: 0.8 mg/dL (ref 0.6–1.1)
Glucose: 86 mg/dl (ref 70–140)
POTASSIUM: 3.8 meq/L (ref 3.5–5.1)
SODIUM: 143 meq/L (ref 136–145)
TOTAL PROTEIN: 7.8 g/dL (ref 6.4–8.3)
Total Bilirubin: 0.54 mg/dL (ref 0.20–1.20)

## 2013-12-11 MED ORDER — IOHEXOL 300 MG/ML  SOLN
100.0000 mL | Freq: Once | INTRAMUSCULAR | Status: AC | PRN
  Administered 2013-12-11: 100 mL via INTRAVENOUS

## 2013-12-12 ENCOUNTER — Telehealth: Payer: Self-pay | Admitting: Family

## 2013-12-12 NOTE — Telephone Encounter (Signed)
Received medical records from [facility]

## 2013-12-13 ENCOUNTER — Encounter: Payer: Self-pay | Admitting: Internal Medicine

## 2013-12-13 ENCOUNTER — Other Ambulatory Visit: Payer: Medicare Other

## 2013-12-13 ENCOUNTER — Ambulatory Visit (HOSPITAL_BASED_OUTPATIENT_CLINIC_OR_DEPARTMENT_OTHER): Payer: Medicare Other

## 2013-12-13 ENCOUNTER — Ambulatory Visit (HOSPITAL_BASED_OUTPATIENT_CLINIC_OR_DEPARTMENT_OTHER): Payer: Medicare Other | Admitting: Internal Medicine

## 2013-12-13 VITALS — BP 111/69 | HR 62 | Temp 97.5°F | Resp 16

## 2013-12-13 VITALS — BP 146/93 | HR 97 | Temp 97.9°F | Resp 18 | Ht 64.0 in | Wt 135.0 lb

## 2013-12-13 DIAGNOSIS — C8291 Follicular lymphoma, unspecified, lymph nodes of head, face, and neck: Secondary | ICD-10-CM

## 2013-12-13 DIAGNOSIS — Z5112 Encounter for antineoplastic immunotherapy: Secondary | ICD-10-CM

## 2013-12-13 DIAGNOSIS — C8590 Non-Hodgkin lymphoma, unspecified, unspecified site: Secondary | ICD-10-CM

## 2013-12-13 MED ORDER — DIPHENHYDRAMINE HCL 25 MG PO CAPS
ORAL_CAPSULE | ORAL | Status: AC
  Filled 2013-12-13: qty 2

## 2013-12-13 MED ORDER — DIPHENHYDRAMINE HCL 25 MG PO CAPS
50.0000 mg | ORAL_CAPSULE | Freq: Once | ORAL | Status: AC
  Administered 2013-12-13: 50 mg via ORAL

## 2013-12-13 MED ORDER — SODIUM CHLORIDE 0.9 % IV SOLN
Freq: Once | INTRAVENOUS | Status: AC
  Administered 2013-12-13: 12:00:00 via INTRAVENOUS

## 2013-12-13 MED ORDER — SODIUM CHLORIDE 0.9 % IV SOLN
375.0000 mg/m2 | Freq: Once | INTRAVENOUS | Status: AC
  Administered 2013-12-13: 600 mg via INTRAVENOUS
  Filled 2013-12-13: qty 60

## 2013-12-13 MED ORDER — ACETAMINOPHEN 325 MG PO TABS
ORAL_TABLET | ORAL | Status: AC
  Filled 2013-12-13: qty 2

## 2013-12-13 MED ORDER — ACETAMINOPHEN 325 MG PO TABS
650.0000 mg | ORAL_TABLET | Freq: Once | ORAL | Status: AC
  Administered 2013-12-13: 650 mg via ORAL

## 2013-12-13 NOTE — Progress Notes (Signed)
Discharged at 1455, ambulatory in no distress.

## 2013-12-13 NOTE — Progress Notes (Signed)
Rapid rituxan ready to infuse.

## 2013-12-13 NOTE — Progress Notes (Signed)
Rapid rituxan rate increased to 200 ml at this time

## 2013-12-13 NOTE — Progress Notes (Signed)
[place] [phone]   [phone]  OFFICE PROGRESS NOTE  [name], NP [place] [address]  DIAGNOSIS: recurrent Non Hodgkin's lymphoma, follicular center cell type with a predominant follicular pattern.Grade (if applicable): Favor high grade (grade 3/3), initially diagnosed in November of 2012   PRIOR THERAPY:  1) Status post treatment with Rituxan weekly for 3 doses in addition to 3 tablets of Afinitor at [facility] in [place].  2) Status post surgical excision of the right neck mass under the care of Dr. Johney Maine on 10/28/2012.   CURRENT THERAPY: Maintenance Rituxan 375 mg/M2 every 2 months status post 6 cycles of his dose was given on 12/14/2012.  ADVANCED DIRECTIVE: Does not have advanced directive.   INTERVAL HISTORY: CORINE SOLORIO 79 y.o. female returns to the clinic today for three-month followup visit. The patient is tolerating her current maintenance treatment with Rituxan fairly well. She has no palpable lymphadenopathy. She denied having any significant nausea or vomiting. She has no fever or chills. The patient denied having any significant weight loss or night sweats. She has no chest pain, shortness of breath, cough or hemoptysis. She had repeat CT scan of the chest, abdomen and pelvis performed recently and she is here for evaluation and discussion of her scan results.  MEDICAL HISTORY: Past Medical History  Diagnosis Date  . Hypothyroidism   . Anemia     pt denies  . Osteoporosis   . Pleural effusion, bilateral     history of  . Pulmonary nodules     history of  . History of pancreatitis     pt denies  . Cystocele     history with rectocele, pt denies  . Scoliosis   . Varicose vein   . Non Hodgkin's lymphoma   . Acute bronchitis 10/09/2012  . Pacemaker 09/15/2011    Medtronic Revo  . Dysrhythmia   . Shortness of breath     with exertion  . GERD (gastroesophageal  reflux disease)   . H/O hiatal hernia   . Arthritis   . Cataract     bil  . Hemorrhoid   . Pericarditis   . Atrial fibrillation     ALLERGIES:  is allergic to amoxicillin; codeine; demerol; morphine and related; vicodin; and sulfonamide derivatives.  MEDICATIONS:  Current Outpatient Prescriptions  Medication Sig Dispense Refill  . acetaminophen (TYLENOL) 500 MG tablet Take 500 mg by mouth every 6 (six) hours as needed for pain.      [name] ALPRAZolam (XANAX) 0.5 MG tablet take 1 tablet by mouth once daily if needed for sleep  30 tablet  0  . amLODipine (NORVASC) 10 MG tablet take 1 tablet by mouth once daily  DUE FOR A FOLLOW-UP OFFICE VISIT  90 tablet  1  . aspirin 81 MG tablet Take 81 mg by mouth daily.      . Calcium Carbonate-Vitamin D (CALTRATE 600+D) 600-400 MG-UNIT per tablet Take 1 tablet by mouth 2 (two) times daily.      [name] denosumab (PROLIA) 60 MG/ML SOLN injection Inject 60 mg into the skin every 6 (six) months. Administer in upper arm, thigh, or abdomen      . ferrous fumarate (HEMOCYTE - 106 MG FE) 325 (106 FE) MG TABS tablet Take 1 tablet by mouth.      . gabapentin (NEURONTIN) 100 MG capsule take 1 capsule by mouth three times a day  90 capsule  2  . KRILL OIL OMEGA-3 PO Take 1 capsule by mouth daily.      [name] levothyroxine (SYNTHROID, LEVOTHROID) 100 MCG tablet take 1 tablet by mouth once daily  90 tablet  1  . omeprazole (PRILOSEC) 40 MG capsule Take 1 capsule (40 mg total) by mouth daily.  180 capsule  1  . vitamin B-12 (CYANOCOBALAMIN) 100 MCG tablet Take 100 mcg by mouth daily.       No current facility-administered medications for this visit.    SURGICAL HISTORY:  Past Surgical History  Procedure Laterality Date  . Pacemaker insertion  09/15/11    MDT  . Tonsillectomy    . Breast surgery  07/30/01    biopsy x 3, bilateral  . Abdominal hysterectomy  04/29/04    partial  . Mass excision Right 10/28/2012    Procedure: Removal of mass on neck       ;  Surgeon: Adin Hector, MD;  Location: [facility];  Service: General;  Laterality: Right;    REVIEW OF SYSTEMS:  Constitutional: negative Eyes: negative Ears, nose, mouth, throat, and face: negative Respiratory: negative Cardiovascular: negative Gastrointestinal: negative Genitourinary:negative Integument/breast: negative Hematologic/lymphatic: negative Musculoskeletal:negative Neurological: negative Behavioral/Psych: negative Endocrine: negative Allergic/Immunologic: negative   PHYSICAL EXAMINATION: General appearance: alert, cooperative and no distress Head: Normocephalic, without obvious abnormality, atraumatic Neck: no adenopathy, no JVD, supple, symmetrical, trachea midline and thyroid not enlarged, symmetric, no tenderness/mass/nodules Lymph nodes: Cervical, supraclavicular, and axillary nodes normal. Resp: clear to auscultation bilaterally Back: symmetric, no curvature. ROM normal. No CVA tenderness. Cardio: regular rate and rhythm, S1, S2 normal, no murmur, click, rub or gallop GI: soft, non-tender; bowel sounds normal; no masses,  no organomegaly Extremities: extremities normal, atraumatic, no cyanosis or edema Neurologic: Alert and oriented X 3, normal strength and tone. Normal symmetric reflexes. Normal coordination and gait  ECOG PERFORMANCE STATUS: 0 - Asymptomatic  Blood pressure 146/93, pulse 97, temperature 97.9 F (36.6 C), temperature source Oral, resp. rate 18, height 5\' 4"  (1.626 m), weight 135 lb (61.236 kg).  LABORATORY DATA: Lab Results  Component Value Date   WBC 5.3 12/11/2013   HGB 15.4 12/11/2013   HCT 45.3 12/11/2013   MCV 96.2 12/11/2013   PLT 233 12/11/2013      Chemistry      Component Value Date/Time   NA 143 12/11/2013 0937   NA 140 08/07/2013 1149   K 3.8 12/11/2013 0937   K 4.6 08/07/2013 1149   CL 103 08/07/2013 1149   CL 109* 12/14/2012 1009   CO2 28 12/11/2013 0937   CO2 28 08/07/2013 1149   BUN 11.9 12/11/2013 0937   BUN 15 08/07/2013 1149   CREATININE 0.8  12/11/2013 0937   CREATININE 0.69 08/07/2013 1149   CREATININE 0.8 12/06/2012 1649      Component Value Date/Time   CALCIUM 10.3 12/11/2013 0937   CALCIUM 9.8 08/07/2013 1149   ALKPHOS 81 12/11/2013 0937   ALKPHOS 77 10/06/2012 1202   AST 38* 12/11/2013 0937   AST 28 10/06/2012 1202   ALT 28 12/11/2013 0937   ALT 19 10/06/2012 1202   BILITOT 0.54 12/11/2013 0937   BILITOT 0.3 10/06/2012 1202       RADIOGRAPHIC STUDIES: Ct Soft Tissue Neck W Contrast  12/11/2013   CLINICAL DATA:  Followup non-Hodgkin's lymphoma. Currently undergoing chemotherapy.  EXAM: CT OF THE NECK WITH CONTRAST  CT OF THE CHEST WITH CONTRAST  CT OF THE ABDOMEN AND PELVIS WITH CONTRAST  TECHNIQUE:  Multidetector CT imaging of the neck was performed with intravenous contrast.; Multidetector CT imaging of the abdomen and pelvis was performed following the standard protocol during bolus administration of intravenous contrast.; Multidetector CT imaging of the chest was performed following the standard protocol during bolus administration of intravenous contrast.  CONTRAST:  156mL OMNIPAQUE IOHEXOL 300 MG/ML  SOLN  COMPARISON:  06/06/2013  FINDINGS: CT NECK FINDINGS  No lymphadenopathy identified within the neck. Mild asymmetric goiter involving the right thyroid lobe greater in left is also stable. The are salivary glands are normal in appearance. Parapharyngeal soft tissue planes3 and larynx are also unremarkable.  CT CHEST FINDINGS  No evidence of mediastinal or hilar lymphadenopathy. No adenopathy seen elsewhere within the thorax. No evidence of pleural or pericardial effusion. Large hiatal hernia again noted.  No evidence of pleural or pericardial effusion. Stable left lower lobe volume loss noted. Scattered tiny sub-cm pulmonary nodules are again seen bilaterally which are stable. No new or enlarging pulmonary nodules or masses are identified. Mild cardiomegaly and transvenous pacemaker again noted.  CT ABDOMEN AND PELVIS FINDINGS  No  lymphadenopathy identified within the abdomen or pelvis. No other soft tissue masses demonstrated.  Tiny hepatic cysts remain stable. The gallbladder, spleen, adrenal glands, and kidneys are unremarkable. No evidence hydronephrosis.  Prior hysterectomy again noted. Adnexal regions are unremarkable. Diverticulosis is again seen mainly involving the left colon, however there is no evidence of diverticulitis. No other inflammatory process or abnormal fluid collections identified. Thoracolumbar spine degenerative changes and scoliosis again demonstrated, however no suspicious bone lesions identified.  IMPRESSION: Stable exam. No evidence of recurrent lymphoma or other acute findings.  Stable nonspecific subcentimeter bilateral pulmonary nodules, most likely postinflammatory in etiology.  Diverticulosis. No radiographic evidence of diverticulitis.   Electronically Signed   By: Earle Gell M.D.   On: 12/11/2013 11:33   Ct Chest W Contrast  12/11/2013   CLINICAL DATA:  Followup non-Hodgkin's lymphoma. Currently undergoing chemotherapy.  EXAM: CT OF THE NECK WITH CONTRAST  CT OF THE CHEST WITH CONTRAST  CT OF THE ABDOMEN AND PELVIS WITH CONTRAST  TECHNIQUE: Multidetector CT imaging of the neck was performed with intravenous contrast.; Multidetector CT imaging of the abdomen and pelvis was performed following the standard protocol during bolus administration of intravenous contrast.; Multidetector CT imaging of the chest was performed following the standard protocol during bolus administration of intravenous contrast.  CONTRAST:  126mL OMNIPAQUE IOHEXOL 300 MG/ML  SOLN  COMPARISON:  06/06/2013  FINDINGS: CT NECK FINDINGS  No lymphadenopathy identified within the neck. Mild asymmetric goiter involving the right thyroid lobe greater in left is also stable. The are salivary glands are normal in appearance. Parapharyngeal soft tissue planes3 and larynx are also unremarkable.  CT CHEST FINDINGS  No evidence of mediastinal or  hilar lymphadenopathy. No adenopathy seen elsewhere within the thorax. No evidence of pleural or pericardial effusion. Large hiatal hernia again noted.  No evidence of pleural or pericardial effusion. Stable left lower lobe volume loss noted. Scattered tiny sub-cm pulmonary nodules are again seen bilaterally which are stable. No new or enlarging pulmonary nodules or masses are identified. Mild cardiomegaly and transvenous pacemaker again noted.  CT ABDOMEN AND PELVIS FINDINGS  No lymphadenopathy identified within the abdomen or pelvis. No other soft tissue masses demonstrated.  Tiny hepatic cysts remain stable. The gallbladder, spleen, adrenal glands, and kidneys are unremarkable. No evidence hydronephrosis.  Prior hysterectomy again noted. Adnexal regions are unremarkable. Diverticulosis is again seen mainly involving the left  colon, however there is no evidence of diverticulitis. No other inflammatory process or abnormal fluid collections identified. Thoracolumbar spine degenerative changes and scoliosis again demonstrated, however no suspicious bone lesions identified.  IMPRESSION: Stable exam. No evidence of recurrent lymphoma or other acute findings.  Stable nonspecific subcentimeter bilateral pulmonary nodules, most likely postinflammatory in etiology.  Diverticulosis. No radiographic evidence of diverticulitis.   Electronically Signed   By: Earle Gell M.D.   On: 12/11/2013 11:33   ASSESSMENT AND PLAN: This is a very pleasant 78 years old white female with recurrent non-Hodgkin's lymphoma, follicular center cell type currently on maintenance treatment with single agent Rituxan status post 6 cycles. The patient is tolerating her treatment fairly well with no significant adverse effects. Her recent CT scan of the neck, chest, abdomen and pelvis showed no significant evidence for disease recurrence. I discussed the scan results with the patient today. I recommended for her to proceed with cycle #7 today as  scheduled. She would come back for followup visit in 2 months with the next cycle of her treatment. She was advised to call immediately if she has any concerning symptoms in the interval. The patient voices understanding of current disease status and treatment options and is in agreement with the current care plan.  All questions were answered. The patient knows to call the clinic with any problems, questions or concerns. We can certainly see the patient much sooner if necessary.  Disclaimer: This note was dictated with voice recognition software. Similar sounding words can inadvertently be transcribed and may not be corrected upon review.

## 2013-12-13 NOTE — Patient Instructions (Signed)

## 2013-12-14 ENCOUNTER — Telehealth: Payer: Self-pay | Admitting: Internal Medicine

## 2013-12-14 NOTE — Telephone Encounter (Signed)
s.w. pt and advised on Aug appt....pt ok and aware °

## 2014-01-01 ENCOUNTER — Telehealth: Payer: Self-pay | Admitting: Internal Medicine

## 2014-01-01 NOTE — Telephone Encounter (Signed)
lvm for pt and advised on 8.12 appt moved to 8.13 due to MD on pal....mailed pt appt sched and letter

## 2014-01-29 ENCOUNTER — Other Ambulatory Visit: Payer: Self-pay | Admitting: Family

## 2014-01-30 NOTE — Telephone Encounter (Signed)
Rx last printed 12/06/13, #30 x no refills. Rx printed and forwarded to Provider for signature.  Medication name:  Name from pharmacy:  ALPRAZolam (XANAX) 0.5 MG tablet  ALPRAZOLAM 0.5 MG TABLET Sig: take 1 tablet by mouth once daily if needed for sleep Dispense: 30 tablet (Pharmacy requested 30) Refills: 0 Start: 01/29/2014 Class: Normal Requested on: 12/06/2013 Originally ordered on: 08/07/2013 Last refill: 12/11/2013

## 2014-01-31 NOTE — Telephone Encounter (Signed)
Rx faxed to pharmacy  

## 2014-02-07 ENCOUNTER — Other Ambulatory Visit: Payer: Medicare Other

## 2014-02-07 ENCOUNTER — Ambulatory Visit: Payer: Medicare Other

## 2014-02-07 ENCOUNTER — Ambulatory Visit: Payer: Medicare Other | Admitting: Internal Medicine

## 2014-02-08 ENCOUNTER — Encounter: Payer: Self-pay | Admitting: Internal Medicine

## 2014-02-08 ENCOUNTER — Other Ambulatory Visit (HOSPITAL_BASED_OUTPATIENT_CLINIC_OR_DEPARTMENT_OTHER): Payer: Medicare Other

## 2014-02-08 ENCOUNTER — Telehealth: Payer: Self-pay | Admitting: Internal Medicine

## 2014-02-08 ENCOUNTER — Ambulatory Visit (HOSPITAL_BASED_OUTPATIENT_CLINIC_OR_DEPARTMENT_OTHER): Payer: Medicare Other

## 2014-02-08 ENCOUNTER — Ambulatory Visit (HOSPITAL_BASED_OUTPATIENT_CLINIC_OR_DEPARTMENT_OTHER): Payer: Medicare Other | Admitting: Internal Medicine

## 2014-02-08 VITALS — BP 155/96 | HR 90 | Temp 97.4°F | Resp 19 | Ht 64.0 in | Wt 137.8 lb

## 2014-02-08 VITALS — BP 120/68 | HR 68 | Temp 97.5°F | Resp 16

## 2014-02-08 DIAGNOSIS — Z5112 Encounter for antineoplastic immunotherapy: Secondary | ICD-10-CM

## 2014-02-08 DIAGNOSIS — C8590 Non-Hodgkin lymphoma, unspecified, unspecified site: Secondary | ICD-10-CM

## 2014-02-08 DIAGNOSIS — C8291 Follicular lymphoma, unspecified, lymph nodes of head, face, and neck: Secondary | ICD-10-CM

## 2014-02-08 LAB — CBC WITH DIFFERENTIAL/PLATELET
BASO%: 1.1 % (ref 0.0–2.0)
Basophils Absolute: 0.1 10*3/uL (ref 0.0–0.1)
EOS%: 6.5 % (ref 0.0–7.0)
Eosinophils Absolute: 0.4 10*3/uL (ref 0.0–0.5)
HCT: 48 % — ABNORMAL HIGH (ref 34.8–46.6)
HGB: 15.9 g/dL (ref 11.6–15.9)
LYMPH#: 1.4 10*3/uL (ref 0.9–3.3)
LYMPH%: 25.4 % (ref 14.0–49.7)
MCH: 32.5 pg (ref 25.1–34.0)
MCHC: 33.1 g/dL (ref 31.5–36.0)
MCV: 98 fL (ref 79.5–101.0)
MONO#: 0.6 10*3/uL (ref 0.1–0.9)
MONO%: 10.3 % (ref 0.0–14.0)
NEUT#: 3.2 10*3/uL (ref 1.5–6.5)
NEUT%: 56.7 % (ref 38.4–76.8)
PLATELETS: 247 10*3/uL (ref 145–400)
RBC: 4.89 10*6/uL (ref 3.70–5.45)
RDW: 13.8 % (ref 11.2–14.5)
WBC: 5.6 10*3/uL (ref 3.9–10.3)

## 2014-02-08 LAB — COMPREHENSIVE METABOLIC PANEL (CC13)
ALT: 20 U/L (ref 0–55)
AST: 31 U/L (ref 5–34)
Albumin: 4.4 g/dL (ref 3.5–5.0)
Alkaline Phosphatase: 73 U/L (ref 40–150)
Anion Gap: 11 mEq/L (ref 3–11)
BILIRUBIN TOTAL: 0.52 mg/dL (ref 0.20–1.20)
BUN: 11.7 mg/dL (ref 7.0–26.0)
CO2: 21 meq/L — AB (ref 22–29)
Calcium: 9.7 mg/dL (ref 8.4–10.4)
Chloride: 109 mEq/L (ref 98–109)
Creatinine: 0.8 mg/dL (ref 0.6–1.1)
Glucose: 83 mg/dl (ref 70–140)
Potassium: 3.9 mEq/L (ref 3.5–5.1)
Sodium: 142 mEq/L (ref 136–145)
Total Protein: 7.7 g/dL (ref 6.4–8.3)

## 2014-02-08 LAB — LACTATE DEHYDROGENASE (CC13): LDH: 289 U/L — ABNORMAL HIGH (ref 125–245)

## 2014-02-08 MED ORDER — SODIUM CHLORIDE 0.9 % IV SOLN
Freq: Once | INTRAVENOUS | Status: AC
  Administered 2014-02-08: 12:00:00 via INTRAVENOUS

## 2014-02-08 MED ORDER — SODIUM CHLORIDE 0.9 % IV SOLN
375.0000 mg/m2 | Freq: Once | INTRAVENOUS | Status: AC
  Administered 2014-02-08: 600 mg via INTRAVENOUS
  Filled 2014-02-08: qty 60

## 2014-02-08 MED ORDER — DIPHENHYDRAMINE HCL 25 MG PO CAPS
50.0000 mg | ORAL_CAPSULE | Freq: Once | ORAL | Status: AC
  Administered 2014-02-08: 50 mg via ORAL

## 2014-02-08 MED ORDER — ACETAMINOPHEN 325 MG PO TABS
ORAL_TABLET | ORAL | Status: AC
  Filled 2014-02-08: qty 2

## 2014-02-08 MED ORDER — ACETAMINOPHEN 325 MG PO TABS
650.0000 mg | ORAL_TABLET | Freq: Once | ORAL | Status: AC
  Administered 2014-02-08: 650 mg via ORAL

## 2014-02-08 MED ORDER — DIPHENHYDRAMINE HCL 25 MG PO CAPS
ORAL_CAPSULE | ORAL | Status: AC
  Filled 2014-02-08: qty 2

## 2014-02-08 NOTE — Patient Instructions (Signed)
[facility] Discharge Instructions for Patients Receiving Chemotherapy  Today you received the following chemotherapy agents rituxan.   To help prevent nausea and vomiting after your treatment, we encourage you to take your nausea medication as directed.     If you develop nausea and vomiting that is not controlled by your nausea medication, call the clinic.   BELOW ARE SYMPTOMS THAT SHOULD BE REPORTED IMMEDIATELY:  *FEVER GREATER THAN 100.5 F  *CHILLS WITH OR WITHOUT FEVER  NAUSEA AND VOMITING THAT IS NOT CONTROLLED WITH YOUR NAUSEA MEDICATION  *UNUSUAL SHORTNESS OF BREATH  *UNUSUAL BRUISING OR BLEEDING  TENDERNESS IN MOUTH AND THROAT WITH OR WITHOUT PRESENCE OF ULCERS  *URINARY PROBLEMS  *BOWEL PROBLEMS  UNUSUAL RASH Items with * indicate a potential emergency and should be followed up as soon as possible.  Feel free to call the clinic you have any questions or concerns. The clinic phone number is [phone].  

## 2014-02-08 NOTE — Progress Notes (Signed)
[facility] Telephone:([phone]   Fax:([phone]  OFFICE PROGRESS NOTE  [name], NP [place] [address]  DIAGNOSIS: recurrent Non Hodgkin's lymphoma, follicular center cell type with a predominant follicular pattern.Grade (if applicable): Favor high grade (grade 3/3), initially diagnosed in November of 2012   PRIOR THERAPY:  1) Status post treatment with Rituxan weekly for 3 doses in addition to 3 tablets of Afinitor at [facility] in [place].  2) Status post surgical excision of the right neck mass under the care of Dr. Johney Maine on 10/28/2012.   CURRENT THERAPY: Maintenance Rituxan 375 mg/M2 every 2 months status post 7 cycles of his dose was given on 12/14/2012.  ADVANCED DIRECTIVE: Does not have advanced directive.   INTERVAL HISTORY: [name] 79 y.o. female returns to the clinic today for 35-month followup visit accompanied by her daughter. The patient is tolerating her current maintenance treatment with Rituxan fairly well. She denied having any significant complaints since her last visit. She has no palpable lymphadenopathy. She denied having any significant nausea or vomiting. She has no fever or chills. The patient denied having any significant weight loss or night sweats. She has no chest pain, shortness of breath, cough or hemoptysis. Her daughter was recently diagnosed with primary immune deficiency syndrome with low IgG and she is currently on treatment for this condition. She is the daughter who has non-Hodgkin lymphoma and similar symptoms to her mom. The daughter's doctor at [facility] requested quantitative immunoglobin for [name] as part of screening for a genetic primary immune deficiency syndrome.  MEDICAL HISTORY: Past Medical History  Diagnosis Date  . Hypothyroidism   . Anemia     pt denies  . Osteoporosis   . Pleural effusion, bilateral     history of  . Pulmonary nodules     history  of  . History of pancreatitis     pt denies  . Cystocele     history with rectocele, pt denies  . Scoliosis   . Varicose vein   . Non Hodgkin's lymphoma   . Acute bronchitis 10/09/2012  . Pacemaker 09/15/2011    Medtronic Revo  . Dysrhythmia   . Shortness of breath     with exertion  . GERD (gastroesophageal reflux disease)   . H/O hiatal hernia   . Arthritis   . Cataract     bil  . Hemorrhoid   . Pericarditis   . Atrial fibrillation     ALLERGIES:  is allergic to amoxicillin; codeine; demerol; morphine and related; vicodin; and sulfonamide derivatives.  MEDICATIONS:  Current Outpatient Prescriptions  Medication Sig Dispense Refill  . acetaminophen (TYLENOL) 500 MG tablet Take 500 mg by mouth every 6 (six) hours as needed for pain.      [name] ALPRAZolam (XANAX) 0.5 MG tablet take 1 tablet by mouth once daily if needed for sleep  30 tablet  0  . amLODipine (NORVASC) 10 MG tablet take 1 tablet by mouth once daily  DUE FOR A FOLLOW-UP OFFICE VISIT  90 tablet  1  . aspirin 81 MG tablet Take 81 mg by mouth daily.      . Calcium Carbonate-Vitamin D (CALTRATE 600+D) 600-400 MG-UNIT per tablet Take 1 tablet by mouth 2 (two) times daily.      [name] denosumab (PROLIA) 60 MG/ML SOLN injection Inject 60 mg into the skin every 6 (six) months. Administer in upper arm, thigh, or abdomen      .  ferrous fumarate (HEMOCYTE - 106 MG FE) 325 (106 FE) MG TABS tablet Take 1 tablet by mouth.      . gabapentin (NEURONTIN) 100 MG capsule take 1 capsule by mouth three times a day  90 capsule  2  . KRILL OIL OMEGA-3 PO Take 1 capsule by mouth daily.      [name] levothyroxine (SYNTHROID, LEVOTHROID) 100 MCG tablet take 1 tablet by mouth once daily  90 tablet  1  . omeprazole (PRILOSEC) 40 MG capsule Take 1 capsule (40 mg total) by mouth daily.  180 capsule  1  . vitamin B-12 (CYANOCOBALAMIN) 100 MCG tablet Take 100 mcg by mouth daily.       No current facility-administered medications for this visit.    SURGICAL  HISTORY:  Past Surgical History  Procedure Laterality Date  . Pacemaker insertion  09/15/11    MDT  . Tonsillectomy    . Breast surgery  07/30/01    biopsy x 3, bilateral  . Abdominal hysterectomy  04/29/04    partial  . Mass excision Right 10/28/2012    Procedure: Removal of mass on neck       ;  Surgeon: Adin Hector, MD;  Location: [facility];  Service: General;  Laterality: Right;    REVIEW OF SYSTEMS:  Constitutional: negative Eyes: negative Ears, nose, mouth, throat, and face: negative Respiratory: negative Cardiovascular: negative Gastrointestinal: negative Genitourinary:negative Integument/breast: negative Hematologic/lymphatic: negative Musculoskeletal:negative Neurological: negative Behavioral/Psych: negative Endocrine: negative Allergic/Immunologic: negative   PHYSICAL EXAMINATION: General appearance: alert, cooperative and no distress Head: Normocephalic, without obvious abnormality, atraumatic Neck: no adenopathy, no JVD, supple, symmetrical, trachea midline and thyroid not enlarged, symmetric, no tenderness/mass/nodules Lymph nodes: Cervical, supraclavicular, and axillary nodes normal. Resp: clear to auscultation bilaterally Back: symmetric, no curvature. ROM normal. No CVA tenderness. Cardio: regular rate and rhythm, S1, S2 normal, no murmur, click, rub or gallop GI: soft, non-tender; bowel sounds normal; no masses,  no organomegaly Extremities: extremities normal, atraumatic, no cyanosis or edema Neurologic: Alert and oriented X 3, normal strength and tone. Normal symmetric reflexes. Normal coordination and gait  ECOG PERFORMANCE STATUS: 0 - Asymptomatic  Blood pressure 155/96, pulse 90, temperature 97.4 F (36.3 C), temperature source Oral, resp. rate 19, height 5\' 4"  (1.626 m), weight 137 lb 12.8 oz (62.506 kg).  LABORATORY DATA: Lab Results  Component Value Date   WBC 5.6 02/08/2014   HGB 15.9 02/08/2014   HCT 48.0* 02/08/2014   MCV 98.0 02/08/2014   PLT 247  02/08/2014      Chemistry      Component Value Date/Time   NA 143 12/11/2013 0937   NA 140 08/07/2013 1149   K 3.8 12/11/2013 0937   K 4.6 08/07/2013 1149   CL 103 08/07/2013 1149   CL 109* 12/14/2012 1009   CO2 28 12/11/2013 0937   CO2 28 08/07/2013 1149   BUN 11.9 12/11/2013 0937   BUN 15 08/07/2013 1149   CREATININE 0.8 12/11/2013 0937   CREATININE 0.69 08/07/2013 1149   CREATININE 0.8 12/06/2012 1649      Component Value Date/Time   CALCIUM 10.3 12/11/2013 0937   CALCIUM 9.8 08/07/2013 1149   ALKPHOS 81 12/11/2013 0937   ALKPHOS 77 10/06/2012 1202   AST 38* 12/11/2013 0937   AST 28 10/06/2012 1202   ALT 28 12/11/2013 0937   ALT 19 10/06/2012 1202   BILITOT 0.54 12/11/2013 0937   BILITOT 0.3 10/06/2012 1202       RADIOGRAPHIC STUDIES:  ASSESSMENT AND PLAN: This is a very pleasant 79 years old white female with recurrent non-Hodgkin's lymphoma, follicular center cell type currently on maintenance treatment with single agent Rituxan status post 7 cycles. The patient is tolerating her treatment fairly well with no significant adverse effects. I recommended for her to proceed with cycle #8 today as scheduled. She would come back for followup visit in 2 months with the next cycle of her treatment. She would have quantitative immunoglobulin performed today based on her request to the daughter's doctor at [facility]. She was advised to call immediately if she has any concerning symptoms in the interval. The patient voices understanding of current disease status and treatment options and is in agreement with the current care plan.  All questions were answered. The patient knows to call the clinic with any problems, questions or concerns. We can certainly see the patient much sooner if necessary.  Disclaimer: This note was dictated with voice recognition software. Similar sounding words can inadvertently be transcribed and may not be corrected upon review.

## 2014-02-08 NOTE — Telephone Encounter (Signed)
Pt confirmed labs/ov per 08/13, gave pt AVS....KJ °

## 2014-02-15 ENCOUNTER — Telehealth: Payer: Self-pay | Admitting: Medical Oncology

## 2014-02-15 NOTE — Telephone Encounter (Signed)
requests lab results QIG. Next appt 10/19

## 2014-02-16 ENCOUNTER — Telehealth: Payer: Self-pay | Admitting: *Deleted

## 2014-02-16 NOTE — Telephone Encounter (Signed)
Called, no answer and left message to call us back.  SLJ

## 2014-02-16 NOTE — Telephone Encounter (Signed)
Message copied by Britt Bottom on Fri Feb 16, 2014  4:33 PM ------      Message from: Ardeen Garland      Created: Fri Feb 16, 2014  9:38 AM                   ----- Message -----         From: Curt Bears, MD         Sent: 02/16/2014   8:52 AM           To: Ardeen Garland, RN            Mild increase in one of the protein, Free Lambda light chain but is still OK to stay on observation unless she wants to resume treatment again sooner.      ----- Message -----         From: Ardeen Garland, RN         Sent: 02/15/2014   4:38 PM           To: Curt Bears, MD            requests lab results QIG. Next appt 10/19       ------

## 2014-02-20 ENCOUNTER — Ambulatory Visit (INDEPENDENT_AMBULATORY_CARE_PROVIDER_SITE_OTHER): Payer: Medicare Other | Admitting: Cardiovascular Disease

## 2014-02-20 ENCOUNTER — Other Ambulatory Visit: Payer: Self-pay | Admitting: Obstetrics and Gynecology

## 2014-02-20 VITALS — BP 110/90 | HR 70 | Resp 16 | Ht 64.0 in | Wt 138.5 lb

## 2014-02-20 DIAGNOSIS — R001 Bradycardia, unspecified: Secondary | ICD-10-CM

## 2014-02-20 DIAGNOSIS — I498 Other specified cardiac arrhythmias: Secondary | ICD-10-CM

## 2014-02-20 DIAGNOSIS — E785 Hyperlipidemia, unspecified: Secondary | ICD-10-CM

## 2014-02-20 DIAGNOSIS — Z950 Presence of cardiac pacemaker: Secondary | ICD-10-CM

## 2014-02-20 LAB — MDC_IDC_ENUM_SESS_TYPE_INCLINIC
Brady Statistic AP VS Percent: 89.36 %
Brady Statistic AS VP Percent: 0.01 %
Brady Statistic RA Percent Paced: 89.67 %
Lead Channel Impedance Value: 528 Ohm
Lead Channel Pacing Threshold Amplitude: 0.5 V
Lead Channel Pacing Threshold Amplitude: 1 V
Lead Channel Pacing Threshold Pulse Width: 0.4 ms
Lead Channel Sensing Intrinsic Amplitude: 15.3504
Lead Channel Setting Pacing Amplitude: 2.5 V
Lead Channel Setting Pacing Pulse Width: 0.8 ms
MDC IDC MSMT BATTERY VOLTAGE: 3 V
MDC IDC MSMT LEADCHNL RA IMPEDANCE VALUE: 400 Ohm
MDC IDC MSMT LEADCHNL RV PACING THRESHOLD PULSEWIDTH: 0.8 ms
MDC IDC SESS DTM: 20150825120113
MDC IDC SET LEADCHNL RA PACING AMPLITUDE: 2 V
MDC IDC SET LEADCHNL RV SENSING SENSITIVITY: 0.9 mV
MDC IDC SET ZONE DETECTION INTERVAL: 390 ms
MDC IDC SET ZONE DETECTION INTERVAL: 400 ms
MDC IDC STAT BRADY AP VP PERCENT: 0.32 %
MDC IDC STAT BRADY AS VS PERCENT: 10.32 %
MDC IDC STAT BRADY RV PERCENT PACED: 0.33 %

## 2014-02-20 NOTE — Patient Instructions (Addendum)
Your physician recommends that you schedule a follow-up appointment in: 6 months with the [facility] and 12 months with Dr.Croitoru   Continue with current medications

## 2014-02-21 ENCOUNTER — Encounter: Payer: Self-pay | Admitting: Cardiovascular Disease

## 2014-02-21 LAB — CYTOLOGY - PAP

## 2014-02-21 NOTE — Progress Notes (Signed)
Patient ID: [name], female   DOB: [birth year], 79 y.o.   MRN: [id number]      Reason for office visit Pacemaker for sinus node dysfunction  [name] is a 79 year old woman with symptomatic sinus node dysfunction/bradycardia for which a pacemaker was implanted in March 2013 (dual chamber Medtronic Revo, MRI conditional). She developed post procedural acute pericarditis and paroxysmal atrial fibrillation. She did not require pericardiocentesis. The symptoms subsided with anti-inflammatory therapy and the atrial fibrillation resolved. It has not recurred since. She was initially treated with antiarrhythmics but these have been since discontinued.  She has a history of non-Hodgkin's lymphoma and felt that she was "cured" but had to recently undergo a lymphadenectomy for cervical lymph node that proves to have evidence of lymphoma, probably high-grade. She is on Rituxan maintenance therapy under Dr. Worthy Flank supervision. She has mild hypertension and treated hypothyroidism  She  denies dyspnea, palpitations, syncope or chest pain. Interrogation of her pacemaker shows good heart rate distribution with activity. There've been no arrhythmias. She has 89% atrial pacing and only 0.4% ventricular pacing. There is a marked increase in her level of activity over the last several months and she has started working out at [employer]. No changes were made to the permanent programming today.    Allergies  Allergen Reactions  . Amoxicillin Hives    Hives  . Codeine Hives    REACTION: "makes her crazy" Hives  . Demerol Hives  . Morphine And Related Hives  . Vicodin [Hydrocodone-Acetaminophen] Hives  . Sulfonamide Derivatives Rash    REACTION: Rash    Current Outpatient Prescriptions  Medication Sig Dispense Refill  . acetaminophen (TYLENOL) 500 MG tablet Take 500 mg by mouth every 6 (six) hours as needed for pain.      [name] ALPRAZolam (XANAX) 0.5 MG tablet take 1 tablet by mouth once daily if  needed for sleep  30 tablet  0  . amLODipine (NORVASC) 5 MG tablet Take 1 tablet (5 mg total) by mouth daily.  30 tablet  2  . aspirin 81 MG tablet Take 81 mg by mouth daily.      . Calcium Carbonate-Vitamin D (CALTRATE 600+D) 600-400 MG-UNIT per tablet Take 1 tablet by mouth 2 (two) times daily.      [name] denosumab (PROLIA) 60 MG/ML SOLN injection Inject 60 mg into the skin every 6 (six) months. Administer in upper arm, thigh, or abdomen      . ferrous fumarate (HEMOCYTE - 106 MG FE) 325 (106 FE) MG TABS tablet Take 1 tablet by mouth.      . gabapentin (NEURONTIN) 100 MG capsule take 1 capsule by mouth daily as needed      . KRILL OIL OMEGA-3 PO Take 1 capsule by mouth daily.      [name] levothyroxine (SYNTHROID, LEVOTHROID) 100 MCG tablet take 1 tablet by mouth once daily  90 tablet  1  . omeprazole (PRILOSEC) 40 MG capsule Take 1 capsule (40 mg total) by mouth daily.  180 capsule  1  . vitamin B-12 (CYANOCOBALAMIN) 100 MCG tablet Take 100 mcg by mouth daily.       No current facility-administered medications for this visit.    Past Medical History  Diagnosis Date  . Hypothyroidism   . Anemia     pt denies  . Osteoporosis   . Pleural effusion, bilateral     history of  . Pulmonary nodules     history of  . History of pancreatitis  pt denies  . Cystocele     history with rectocele, pt denies  . Scoliosis   . Varicose vein   . Non Hodgkin's lymphoma   . Acute bronchitis 10/09/2012  . Pacemaker 09/15/2011    Medtronic Revo  . Dysrhythmia   . Shortness of breath     with exertion  . GERD (gastroesophageal reflux disease)   . H/O hiatal hernia   . Arthritis   . Cataract     bil  . Hemorrhoid   . Pericarditis   . Atrial fibrillation     Past Surgical History  Procedure Laterality Date  . Pacemaker insertion  09/15/11    MDT  . Tonsillectomy    . Breast surgery  07/30/01    biopsy x 3, bilateral  . Abdominal hysterectomy  04/29/04    partial  . Mass excision Right 10/28/2012     Procedure: Removal of mass on neck       ;  Surgeon: Adin Hector, MD;  Location: [facility];  Service: General;  Laterality: Right;    Family History  Problem Relation Age of Onset  . Heart disease Daughter     congenital "whole in her heart"  . Stroke Mother   . Lymphoma Daughter     nonhodgkins  . Stroke Father   . Breast cancer Sister   . Heart disease Brother     History   Social History  . Marital Status: Widowed    Spouse Name: N/A    Number of Children: 3  . Years of Education: N/A   Occupational History  . self employed    Social History Main Topics  . Smoking status: Never Smoker   . Smokeless tobacco: Never Used  . Alcohol Use: Yes     Comment: 1 glass of wine occasionally  . Drug Use: No  . Sexual Activity: Not on file   Other Topics Concern  . 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  She lives alone in a 3-story home.  There is a lift chair which she does not use (placed for her husband).    Review of systems: The patient specifically denies any chest pain at rest or with exertion, dyspnea at rest or with exertion, orthopnea, paroxysmal nocturnal dyspnea, syncope, palpitations, focal neurological deficits, intermittent claudication, lower extremity edema, unexplained weight gain, cough, hemoptysis or wheezing.  The patient also denies abdominal pain, nausea, vomiting, dysphagia, diarrhea, constipation, polyuria, polydipsia, dysuria, hematuria, frequency, urgency, abnormal bleeding or bruising, fever, chills, unexpected weight changes, mood swings, change in skin or hair texture, change in voice quality, auditory or visual problems, allergic reactions or rashes, new musculoskeletal complaints other than usual "aches and pains".   PHYSICAL EXAM BP 110/90  Pulse 70  Resp 16  Ht 5\' 4"  (1.626 m)  Wt 138 lb 8 oz (62.823 kg)  BMI 23.76 kg/m2  General: Alert,  oriented x3, no distress Head: no evidence of trauma, PERRL, EOMI, no exophtalmos or lid lag, no myxedema, no xanthelasma; normal ears, nose and oropharynx Neck: normal jugular venous pulsations and no hepatojugular reflux; brisk carotid pulses without delay and no carotid bruits Chest: clear to auscultation, no signs of consolidation by percussion or palpation, normal fremitus, symmetrical and full respiratory excursions Cardiovascular: normal position and quality of the apical impulse, regular rhythm, normal first and second heart sounds, no murmurs, rubs or gallops Abdomen: no tenderness  or distention, no masses by palpation, no abnormal pulsatility or arterial bruits, normal bowel sounds, no hepatosplenomegaly Extremities: no clubbing, cyanosis or edema; 2+ radial, ulnar and brachial pulses bilaterally; 2+ right femoral, posterior tibial and dorsalis pedis pulses; 2+ left femoral, posterior tibial and dorsalis pedis pulses; no subclavian or femoral bruits Neurological: grossly nonfocal   EKG: Atrial paced, ventricular sense otherwise normal  Lipid Panel     Component Value Date/Time   CHOL 238* 10/06/2012 1202   TRIG 199* 10/06/2012 1202   HDL 53 10/06/2012 1202   CHOLHDL 4.5 10/06/2012 1202   VLDL 40 10/06/2012 1202   LDLCALC 145* 10/06/2012 1202    BMET    Component Value Date/Time   NA 142 02/08/2014 0956   NA 140 08/07/2013 1149   K 3.9 02/08/2014 0956   K 4.6 08/07/2013 1149   CL 103 08/07/2013 1149   CL 109* 12/14/2012 1009   CO2 21* 02/08/2014 0956   CO2 28 08/07/2013 1149   GLUCOSE 83 02/08/2014 0956   GLUCOSE 77 08/07/2013 1149   GLUCOSE 64* 12/14/2012 1009   BUN 11.7 02/08/2014 0956   BUN 15 08/07/2013 1149   CREATININE 0.8 02/08/2014 0956   CREATININE 0.69 08/07/2013 1149   CREATININE 0.8 12/06/2012 1649   CALCIUM 9.7 02/08/2014 0956   CALCIUM 9.8 08/07/2013 1149   GFRNONAA 66* 10/25/2012 1036   GFRNONAA >60 10/16/2010 1013   GFRAA 76* 10/25/2012 1036   GFRAA >60 10/16/2010 1013      ASSESSMENT AND PLAN  Normal dual chamber permanent pacemaker function with high percentage of atrial pacing but virtually no ventricular pacing. No further atrial fibrillation has been recorded and this is felt to have been a transient arrhythmia related to postprocedural pericarditis. She does not require anticoagulation. She is physically very active and feels well. Rate response settings are appropriate. Continue remote monitoring every 3 months and yearly office visits.  Orders Placed This Encounter  Procedures  . Implantable device check  . EKG 12-Lead   Meds ordered this encounter  Medications  . gabapentin (NEURONTIN) 100 MG capsule    Sig: take 1 capsule by mouth daily as needed    Kordell Jafri  Sanda Klein, MD, [facility] [phone] office [phone] pager

## 2014-02-23 ENCOUNTER — Other Ambulatory Visit: Payer: Self-pay | Admitting: Family

## 2014-02-23 NOTE — Telephone Encounter (Signed)
Rx sent to pharmacy. Pt is past due for f/u. Please call to schedule. Thank you. LDM

## 2014-02-23 NOTE — Telephone Encounter (Signed)
Informed patient of medication refill and she scheduled appointment for mid september

## 2014-03-12 ENCOUNTER — Encounter: Payer: Self-pay | Admitting: Cardiovascular Disease

## 2014-03-14 ENCOUNTER — Encounter: Payer: Self-pay | Admitting: Family

## 2014-03-14 ENCOUNTER — Ambulatory Visit (INDEPENDENT_AMBULATORY_CARE_PROVIDER_SITE_OTHER): Payer: Medicare Other | Admitting: Family

## 2014-03-14 VITALS — BP 137/69 | HR 90 | Temp 97.9°F | Resp 18 | Ht 64.0 in | Wt 140.4 lb

## 2014-03-14 DIAGNOSIS — M6740 Ganglion, unspecified site: Secondary | ICD-10-CM

## 2014-03-14 DIAGNOSIS — Z23 Encounter for immunization: Secondary | ICD-10-CM

## 2014-03-14 DIAGNOSIS — IMO0002 Reserved for concepts with insufficient information to code with codable children: Secondary | ICD-10-CM | POA: Insufficient documentation

## 2014-03-14 DIAGNOSIS — I10 Essential (primary) hypertension: Secondary | ICD-10-CM

## 2014-03-14 DIAGNOSIS — E039 Hypothyroidism, unspecified: Secondary | ICD-10-CM

## 2014-03-14 NOTE — Progress Notes (Signed)
Pre visit review using our clinic review tool, if applicable. No additional management support is needed unless otherwise documented below in the visit note. 

## 2014-03-14 NOTE — Progress Notes (Signed)
Subjective:    Patient ID: [name], female    DOB: April 29, 1935, 79 y.o.   MRN: [id number]  HPI  [name] is a 79 yr old female who presents today for follow up of multiple medical problems.  HTN- She is maintained on amlodipine 5mg . She has CMET and CBC scheduled with Dr. Earlie Server. Denies CP/SOB or swelling. Does report occasional dizziness.  BP Readings from Last 3 Encounters:  03/14/14 137/69  02/20/14 110/90  02/08/14 120/68   Hypothyroid- maintained on levothyroxine 141mcg once daily. TSH was normal on 6/10.   Cyst on right thumb- reports that this cyst is sore and she would like evaluation for removal.  Review of Systems See HPI  Past Medical History  Diagnosis Date  . Hypothyroidism   . Anemia     pt denies  . Osteoporosis   . Pleural effusion, bilateral     history of  . Pulmonary nodules     history of  . History of pancreatitis     pt denies  . Cystocele     history with rectocele, pt denies  . Scoliosis   . Varicose vein   . Non Hodgkin's lymphoma   . Acute bronchitis 10/09/2012  . Pacemaker 09/15/2011    Medtronic Revo  . Dysrhythmia   . Shortness of breath     with exertion  . GERD (gastroesophageal reflux disease)   . H/O hiatal hernia   . Arthritis   . Cataract     bil  . Hemorrhoid   . Pericarditis   . Atrial fibrillation     History   Social History  . Marital Status: Widowed    Spouse Name: N/A    Number of Children: 3  . Years of Education: N/A   Occupational History  . self employed    Social History Main Topics  . Smoking status: Never Smoker   . Smokeless tobacco: Never Used  . Alcohol Use: Yes     Comment: 1 glass of wine occasionally  . Drug Use: No  . Sexual Activity: Not on file   Other Topics Concern  . 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  She lives alone in a 3-story home.  There is a lift chair which she  does not use (placed for her husband).    Past Surgical History  Procedure Laterality Date  . Pacemaker insertion  09/15/11    MDT  . Tonsillectomy    . Breast surgery  07/30/01    biopsy x 3, bilateral  . Abdominal hysterectomy  04/29/04    partial  . Mass excision Right 10/28/2012    Procedure: Removal of mass on neck       ;  Surgeon: Adin Hector, MD;  Location: [facility];  Service: General;  Laterality: Right;    Family History  Problem Relation Age of Onset  . Heart disease Daughter     congenital "whole in her heart"  . Stroke Mother   . Lymphoma Daughter     nonhodgkins  . Stroke Father   . Breast cancer Sister   . Heart disease Brother     Allergies  Allergen Reactions  . Amoxicillin Hives    Hives  . Codeine Hives    REACTION: "makes her crazy" Hives  . Demerol Hives  . Morphine And Related Hives  . Vicodin [Hydrocodone-Acetaminophen] Hives  . Sulfonamide  Derivatives Rash    REACTION: Rash    Current Outpatient Prescriptions on File Prior to Visit  Medication Sig Dispense Refill  . acetaminophen (TYLENOL) 500 MG tablet Take 500 mg by mouth every 6 (six) hours as needed for pain.      [name] ALPRAZolam (XANAX) 0.5 MG tablet take 1 tablet by mouth once daily if needed for sleep  30 tablet  0  . amLODipine (NORVASC) 5 MG tablet Take 1 tablet (5 mg total) by mouth daily.  30 tablet  2  . aspirin 81 MG tablet Take 81 mg by mouth daily.      . Calcium Carbonate-Vitamin D (CALTRATE 600+D) 600-400 MG-UNIT per tablet Take 1 tablet by mouth 2 (two) times daily.      [name] denosumab (PROLIA) 60 MG/ML SOLN injection Inject 60 mg into the skin every 6 (six) months. Administer in upper arm, thigh, or abdomen      . ferrous fumarate (HEMOCYTE - 106 MG FE) 325 (106 FE) MG TABS tablet Take 1 tablet by mouth.      . gabapentin (NEURONTIN) 100 MG capsule take 1 capsule by mouth daily as needed      . KRILL OIL OMEGA-3 PO Take 1 capsule by mouth daily.      [name] levothyroxine (SYNTHROID,  LEVOTHROID) 100 MCG tablet take 1 tablet by mouth once daily  90 tablet  1  . omeprazole (PRILOSEC) 40 MG capsule take 1 capsule by mouth once daily  30 capsule  0  . vitamin B-12 (CYANOCOBALAMIN) 100 MCG tablet Take 100 mcg by mouth daily.       No current facility-administered medications on file prior to visit.    BP 137/69  Pulse 90  Temp(Src) 97.9 F (36.6 C) (Oral)  Resp 18  Ht 5\' 4"  (1.626 m)  Wt 140 lb 6.4 oz (63.685 kg)  BMI 24.09 kg/m2  SpO2 99%       Objective:   Physical Exam  Constitutional: She is oriented to person, place, and time. She appears well-developed and well-nourished. No distress.  Cardiovascular: Normal rate and regular rhythm.   No murmur heard. Pulmonary/Chest: Effort normal and breath sounds normal. No respiratory distress. She has no wheezes. She has no rales. She exhibits no tenderness.  Musculoskeletal: She exhibits no edema.  + cyst noted right thumb IP joint  Neurological: She is alert and oriented to person, place, and time.  Skin: Skin is warm and dry.  Psychiatric: She has a normal mood and affect. Her behavior is normal. Judgment and thought content normal.          Assessment & Plan:

## 2014-03-14 NOTE — Assessment & Plan Note (Signed)
Clinically stable on synthroid, continue same.  

## 2014-03-14 NOTE — Assessment & Plan Note (Signed)
Will refer to hand surgeon 

## 2014-03-14 NOTE — Patient Instructions (Signed)
Please follow up in 4 months

## 2014-03-14 NOTE — Assessment & Plan Note (Signed)
BP a bit higher today, but still OK. Continue amlodipine, has upcoming cmet scheduled with Dr. Earlie Server.

## 2014-03-22 ENCOUNTER — Other Ambulatory Visit: Payer: Self-pay | Admitting: Family

## 2014-03-23 NOTE — Telephone Encounter (Signed)
Rx faxed to pharmacy  

## 2014-03-23 NOTE — Telephone Encounter (Signed)
Pt has follow up 06/2014. Rx printed and forwarded to PRovider for signature.  Medication name:  Name from pharmacy:  ALPRAZolam (XANAX) 0.5 MG tablet  ALPRAZOLAM 0.5 MG TABLET Sig: take 1 tablet by mouth once daily if needed for sleep Dispense: 30 tablet (Pharmacy requested 30) Refills: 0 Start: 03/22/2014 Class: Normal Requested on: 01/31/2014 Originally ordered on: 08/07/2013 Last refill: 01/31/2014

## 2014-03-26 ENCOUNTER — Other Ambulatory Visit: Payer: Self-pay | Admitting: Orthopedic Surgery

## 2014-04-04 ENCOUNTER — Ambulatory Visit (HOSPITAL_BASED_OUTPATIENT_CLINIC_OR_DEPARTMENT_OTHER): Payer: Medicare Other

## 2014-04-04 ENCOUNTER — Other Ambulatory Visit (HOSPITAL_BASED_OUTPATIENT_CLINIC_OR_DEPARTMENT_OTHER): Payer: Medicare Other

## 2014-04-04 ENCOUNTER — Other Ambulatory Visit: Payer: Self-pay | Admitting: Internal Medicine

## 2014-04-04 VITALS — BP 130/70 | HR 75 | Temp 97.9°F | Resp 18

## 2014-04-04 DIAGNOSIS — C8590 Non-Hodgkin lymphoma, unspecified, unspecified site: Secondary | ICD-10-CM

## 2014-04-04 DIAGNOSIS — Z5112 Encounter for antineoplastic immunotherapy: Secondary | ICD-10-CM

## 2014-04-04 DIAGNOSIS — C8291 Follicular lymphoma, unspecified, lymph nodes of head, face, and neck: Secondary | ICD-10-CM

## 2014-04-04 LAB — CBC WITH DIFFERENTIAL/PLATELET
BASO%: 1.5 % (ref 0.0–2.0)
BASOS ABS: 0.1 10*3/uL (ref 0.0–0.1)
EOS ABS: 0.3 10*3/uL (ref 0.0–0.5)
EOS%: 5.5 % (ref 0.0–7.0)
HCT: 44.8 % (ref 34.8–46.6)
HEMOGLOBIN: 15.1 g/dL (ref 11.6–15.9)
LYMPH%: 25.8 % (ref 14.0–49.7)
MCH: 32.2 pg (ref 25.1–34.0)
MCHC: 33.7 g/dL (ref 31.5–36.0)
MCV: 95.5 fL (ref 79.5–101.0)
MONO#: 0.5 10*3/uL (ref 0.1–0.9)
MONO%: 7.5 % (ref 0.0–14.0)
NEUT#: 3.6 10*3/uL (ref 1.5–6.5)
NEUT%: 59.7 % (ref 38.4–76.8)
PLATELETS: 232 10*3/uL (ref 145–400)
RBC: 4.69 10*6/uL (ref 3.70–5.45)
RDW: 13.8 % (ref 11.2–14.5)
WBC: 6 10*3/uL (ref 3.9–10.3)
lymph#: 1.6 10*3/uL (ref 0.9–3.3)

## 2014-04-04 LAB — COMPREHENSIVE METABOLIC PANEL (CC13)
ALBUMIN: 3.9 g/dL (ref 3.5–5.0)
ALT: 20 U/L (ref 0–55)
ANION GAP: 9 meq/L (ref 3–11)
AST: 26 U/L (ref 5–34)
Alkaline Phosphatase: 98 U/L (ref 40–150)
BUN: 12.7 mg/dL (ref 7.0–26.0)
CO2: 24 meq/L (ref 22–29)
Calcium: 9.9 mg/dL (ref 8.4–10.4)
Chloride: 111 mEq/L — ABNORMAL HIGH (ref 98–109)
Creatinine: 0.7 mg/dL (ref 0.6–1.1)
Glucose: 73 mg/dl (ref 70–140)
POTASSIUM: 4 meq/L (ref 3.5–5.1)
SODIUM: 144 meq/L (ref 136–145)
TOTAL PROTEIN: 7.2 g/dL (ref 6.4–8.3)
Total Bilirubin: 0.41 mg/dL (ref 0.20–1.20)

## 2014-04-04 LAB — LACTATE DEHYDROGENASE (CC13): LDH: 240 U/L (ref 125–245)

## 2014-04-04 MED ORDER — ACETAMINOPHEN 325 MG PO TABS
650.0000 mg | ORAL_TABLET | Freq: Once | ORAL | Status: AC
  Administered 2014-04-04: 650 mg via ORAL

## 2014-04-04 MED ORDER — DIPHENHYDRAMINE HCL 25 MG PO CAPS
ORAL_CAPSULE | ORAL | Status: AC
  Filled 2014-04-04: qty 2

## 2014-04-04 MED ORDER — DIPHENHYDRAMINE HCL 25 MG PO CAPS
50.0000 mg | ORAL_CAPSULE | Freq: Once | ORAL | Status: AC
  Administered 2014-04-04: 50 mg via ORAL

## 2014-04-04 MED ORDER — SODIUM CHLORIDE 0.9 % IV SOLN
Freq: Once | INTRAVENOUS | Status: AC
  Administered 2014-04-04: 11:00:00 via INTRAVENOUS

## 2014-04-04 MED ORDER — SODIUM CHLORIDE 0.9 % IV SOLN
375.0000 mg/m2 | Freq: Once | INTRAVENOUS | Status: AC
  Administered 2014-04-04: 600 mg via INTRAVENOUS
  Filled 2014-04-04: qty 60

## 2014-04-04 MED ORDER — ACETAMINOPHEN 325 MG PO TABS
ORAL_TABLET | ORAL | Status: AC
  Filled 2014-04-04: qty 2

## 2014-04-04 NOTE — Patient Instructions (Signed)

## 2014-04-05 ENCOUNTER — Ambulatory Visit (INDEPENDENT_AMBULATORY_CARE_PROVIDER_SITE_OTHER): Payer: Medicare Other | Admitting: *Deleted

## 2014-04-05 ENCOUNTER — Encounter (HOSPITAL_BASED_OUTPATIENT_CLINIC_OR_DEPARTMENT_OTHER): Payer: Self-pay | Admitting: *Deleted

## 2014-04-05 DIAGNOSIS — R001 Bradycardia, unspecified: Secondary | ICD-10-CM

## 2014-04-05 LAB — MDC_IDC_ENUM_SESS_TYPE_INCLINIC
Battery Voltage: 3 V
Brady Statistic AP VP Percent: 0.33 %
Brady Statistic AP VS Percent: 93.72 %
Brady Statistic AS VP Percent: 0.01 %
Brady Statistic RA Percent Paced: 94.05 %
Brady Statistic RV Percent Paced: 0.34 %
Date Time Interrogation Session: 20151008150820
Lead Channel Impedance Value: 376 Ohm
Lead Channel Pacing Threshold Amplitude: 0.5 V
Lead Channel Pacing Threshold Amplitude: 0.5 V
Lead Channel Sensing Intrinsic Amplitude: 14.6682
Lead Channel Setting Pacing Amplitude: 2 V
MDC IDC MSMT LEADCHNL RA PACING THRESHOLD PULSEWIDTH: 0.4 ms
MDC IDC MSMT LEADCHNL RV IMPEDANCE VALUE: 488 Ohm
MDC IDC MSMT LEADCHNL RV PACING THRESHOLD PULSEWIDTH: 0.8 ms
MDC IDC SET LEADCHNL RV PACING AMPLITUDE: 2.5 V
MDC IDC SET LEADCHNL RV PACING PULSEWIDTH: 0.8 ms
MDC IDC SET LEADCHNL RV SENSING SENSITIVITY: 0.9 mV
MDC IDC SET ZONE DETECTION INTERVAL: 390 ms
MDC IDC STAT BRADY AS VS PERCENT: 5.94 %
Zone Setting Detection Interval: 400 ms

## 2014-04-05 NOTE — Progress Notes (Signed)
Pacemaker check in clinic for palps. Normal device function. Thresholds, sensing, impedances consistent with previous measurements. Pt was able to feel RV threshold test and states that it was the feeling she had been getting occas at rest---0.3% RVp. Device programmed to maximize longevity. No mode switch or high ventricular rates noted. Device programmed at appropriate safety margins. RV amp decreased to 1.5V to hopefully ease sxms. Histogram distribution appropriate for patient activity level. Device programmed to optimize intrinsic conduction. Batt voltage 3.00V (ERI 2.82V). Patient will follow up with the [facility] and [facility] as scheduled.

## 2014-04-05 NOTE — Progress Notes (Signed)
Pt still works-active-just had labs done

## 2014-04-06 ENCOUNTER — Telehealth: Payer: Self-pay | Admitting: *Deleted

## 2014-04-06 ENCOUNTER — Other Ambulatory Visit: Payer: Self-pay | Admitting: Family Medicine

## 2014-04-06 ENCOUNTER — Other Ambulatory Visit: Payer: Self-pay | Admitting: Family

## 2014-04-06 MED ORDER — AMLODIPINE BESYLATE 5 MG PO TABS: 5.0000 mg | ORAL_TABLET | Freq: Every day | ORAL | Status: DC

## 2014-04-06 NOTE — Telephone Encounter (Signed)
Perioperative Rx for implanted Cardiac Device programming during cyst removal right thumb.

## 2014-04-06 NOTE — Telephone Encounter (Signed)
Rx request to pharmacy/SLS  

## 2014-04-10 ENCOUNTER — Ambulatory Visit (HOSPITAL_BASED_OUTPATIENT_CLINIC_OR_DEPARTMENT_OTHER): Admission: RE | Admit: 2014-04-10 | Payer: Medicare Other | Source: Ambulatory Visit | Admitting: Orthopedic Surgery

## 2014-04-10 SURGERY — EXCISION METACARPAL MASS
Anesthesia: Regional | Site: Thumb | Laterality: Right

## 2014-04-13 ENCOUNTER — Other Ambulatory Visit: Payer: Self-pay | Admitting: Family Medicine

## 2014-04-16 ENCOUNTER — Telehealth: Payer: Self-pay | Admitting: Internal Medicine

## 2014-04-16 ENCOUNTER — Other Ambulatory Visit (HOSPITAL_BASED_OUTPATIENT_CLINIC_OR_DEPARTMENT_OTHER): Payer: Medicare Other

## 2014-04-16 ENCOUNTER — Ambulatory Visit: Payer: Medicare Other

## 2014-04-16 ENCOUNTER — Encounter: Payer: Self-pay | Admitting: Internal Medicine

## 2014-04-16 ENCOUNTER — Ambulatory Visit (HOSPITAL_BASED_OUTPATIENT_CLINIC_OR_DEPARTMENT_OTHER): Payer: Medicare Other | Admitting: Internal Medicine

## 2014-04-16 VITALS — BP 133/86 | HR 77 | Temp 97.7°F | Resp 18 | Ht 64.0 in | Wt 140.4 lb

## 2014-04-16 DIAGNOSIS — C8581 Other specified types of non-Hodgkin lymphoma, lymph nodes of head, face, and neck: Secondary | ICD-10-CM

## 2014-04-16 DIAGNOSIS — C8590 Non-Hodgkin lymphoma, unspecified, unspecified site: Secondary | ICD-10-CM

## 2014-04-16 LAB — CBC WITH DIFFERENTIAL/PLATELET
BASO%: 1.8 % (ref 0.0–2.0)
BASOS ABS: 0.1 10*3/uL (ref 0.0–0.1)
EOS%: 5.9 % (ref 0.0–7.0)
Eosinophils Absolute: 0.3 10*3/uL (ref 0.0–0.5)
HEMATOCRIT: 44.9 % (ref 34.8–46.6)
HGB: 14.9 g/dL (ref 11.6–15.9)
LYMPH%: 28.9 % (ref 14.0–49.7)
MCH: 31.7 pg (ref 25.1–34.0)
MCHC: 33.2 g/dL (ref 31.5–36.0)
MCV: 95.5 fL (ref 79.5–101.0)
MONO#: 0.7 10*3/uL (ref 0.1–0.9)
MONO%: 12.6 % (ref 0.0–14.0)
NEUT#: 2.8 10*3/uL (ref 1.5–6.5)
NEUT%: 50.8 % (ref 38.4–76.8)
PLATELETS: 255 10*3/uL (ref 145–400)
RBC: 4.7 10*6/uL (ref 3.70–5.45)
RDW: 13.9 % (ref 11.2–14.5)
WBC: 5.5 10*3/uL (ref 3.9–10.3)
lymph#: 1.6 10*3/uL (ref 0.9–3.3)

## 2014-04-16 LAB — COMPREHENSIVE METABOLIC PANEL (CC13)
ALT: 25 U/L (ref 0–55)
ANION GAP: 8 meq/L (ref 3–11)
AST: 26 U/L (ref 5–34)
Albumin: 4 g/dL (ref 3.5–5.0)
Alkaline Phosphatase: 83 U/L (ref 40–150)
BILIRUBIN TOTAL: 0.55 mg/dL (ref 0.20–1.20)
BUN: 13.8 mg/dL (ref 7.0–26.0)
CO2: 26 mEq/L (ref 22–29)
CREATININE: 0.8 mg/dL (ref 0.6–1.1)
Calcium: 9.7 mg/dL (ref 8.4–10.4)
Chloride: 108 mEq/L (ref 98–109)
Glucose: 86 mg/dl (ref 70–140)
Potassium: 3.8 mEq/L (ref 3.5–5.1)
Sodium: 143 mEq/L (ref 136–145)
Total Protein: 6.8 g/dL (ref 6.4–8.3)

## 2014-04-16 LAB — LACTATE DEHYDROGENASE (CC13): LDH: 221 U/L (ref 125–245)

## 2014-04-16 NOTE — Telephone Encounter (Signed)
gv and printed appt sched and avs for pt for DEC °

## 2014-04-16 NOTE — Progress Notes (Signed)
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 in [place].  2) Status post surgical excision of the right neck mass under the care of Dr. Johney Maine on 10/28/2012.   CURRENT THERAPY: Maintenance Rituxan 375 mg/M2 every 2 months status post 9 cycles of his dose was given on 12/14/2012.  ADVANCED DIRECTIVE: Does not have advanced directive.   INTERVAL HISTORY: [name] 79 y.o. female returns to the clinic today for 53-month followup visit. The patient is tolerating her current maintenance treatment with Rituxan fairly well. She denied having any significant complaints since her last visit. She has no palpable lymphadenopathy. She denied having any significant nausea or vomiting. She has no fever or chills. The patient denied having any significant weight loss or night sweats. She has no chest pain, shortness of breath, cough or hemoptysis.   MEDICAL HISTORY: Past Medical History  Diagnosis Date  . Hypothyroidism   . Anemia     pt denies  . Osteoporosis   . Pleural effusion, bilateral     history of  . Pulmonary nodules     history of  . History of pancreatitis     pt denies  . Cystocele     history with rectocele, pt denies  . Scoliosis   . Varicose vein   . Non Hodgkin's lymphoma   . Acute bronchitis 10/09/2012  . Pacemaker 09/15/2011    Medtronic Revo  . Dysrhythmia   . Shortness of breath     with exertion  . GERD (gastroesophageal reflux disease)   . H/O hiatal hernia   . Arthritis   . Cataract     bil    . Hemorrhoid   . Pericarditis   . Atrial fibrillation     ALLERGIES:  is allergic to amoxicillin; codeine; demerol; morphine and related; vicodin; and sulfonamide derivatives.  MEDICATIONS:  Current Outpatient Prescriptions  Medication Sig Dispense Refill  . acetaminophen (TYLENOL) 500 MG tablet Take 500 mg by mouth every 6 (six) hours as needed for pain.      [name] ALPRAZolam (XANAX) 0.5 MG tablet take 1 tablet by mouth once daily if needed for sleep  30 tablet  0  . amLODipine (NORVASC) 10 MG tablet take 1 tablet by mouth once daily  DUE FOR A FOLLOW-UP OFFICE VISIT  90 tablet  1  . aspirin 81 MG tablet Take 81 mg by mouth daily.      . Calcium Carbonate-Vitamin D (CALTRATE 600+D) 600-400 MG-UNIT per tablet Take 1 tablet by mouth 2 (two) times daily.      [name] denosumab (PROLIA) 60 MG/ML SOLN injection Inject 60 mg into the skin every 6 (six) months. Administer in upper arm, thigh, or abdomen      . ferrous fumarate (HEMOCYTE - 106 MG FE) 325 (106 FE) MG TABS tablet Take 1 tablet by mouth.      . gabapentin (NEURONTIN) 100 MG capsule take 1 capsule by mouth three times a day  90 capsule  2  . KRILL OIL OMEGA-3 PO Take 1 capsule by  mouth daily.      [name] levothyroxine (SYNTHROID, LEVOTHROID) 100 MCG tablet take 1 tablet by mouth once daily  90 tablet  1  . omeprazole (PRILOSEC) 40 MG capsule Take 1 capsule (40 mg total) by mouth daily.  180 capsule  1  . vitamin B-12 (CYANOCOBALAMIN) 100 MCG tablet Take 100 mcg by mouth daily.       No current facility-administered medications for this visit.    SURGICAL HISTORY:  Past Surgical History  Procedure Laterality Date  . Pacemaker insertion  09/15/11    MDT  . Tonsillectomy    . Breast surgery  07/30/01    biopsy x 3, bilateral  . Abdominal hysterectomy  04/29/04    partial  . Mass excision Right 10/28/2012    Procedure: Removal of mass on neck       ;  Surgeon: Adin Hector, MD;  Location: [facility];  Service: General;  Laterality: Right;  .  Colonoscopy    . Eye surgery      cataracts    REVIEW OF SYSTEMS:  Constitutional: negative Eyes: negative Ears, nose, mouth, throat, and face: negative Respiratory: negative Cardiovascular: negative Gastrointestinal: negative Genitourinary:negative Integument/breast: negative Hematologic/lymphatic: negative Musculoskeletal:negative Neurological: negative Behavioral/Psych: negative Endocrine: negative Allergic/Immunologic: negative   PHYSICAL EXAMINATION: General appearance: alert, cooperative and no distress Head: Normocephalic, without obvious abnormality, atraumatic Neck: no adenopathy, no JVD, supple, symmetrical, trachea midline and thyroid not enlarged, symmetric, no tenderness/mass/nodules Lymph nodes: Cervical, supraclavicular, and axillary nodes normal. Resp: clear to auscultation bilaterally Back: symmetric, no curvature. ROM normal. No CVA tenderness. Cardio: regular rate and rhythm, S1, S2 normal, no murmur, click, rub or gallop GI: soft, non-tender; bowel sounds normal; no masses,  no organomegaly Extremities: extremities normal, atraumatic, no cyanosis or edema Neurologic: Alert and oriented X 3, normal strength and tone. Normal symmetric reflexes. Normal coordination and gait  ECOG PERFORMANCE STATUS: 0 - Asymptomatic  Blood pressure 133/86, pulse 77, temperature 97.7 F (36.5 C), temperature source Oral, resp. rate 18, height 5\' 4"  (1.626 m), weight 140 lb 6.4 oz (63.685 kg), SpO2 100.00%.  LABORATORY DATA: Lab Results  Component Value Date   WBC 5.5 04/16/2014   HGB 14.9 04/16/2014   HCT 44.9 04/16/2014   MCV 95.5 04/16/2014   PLT 255 04/16/2014      Chemistry      Component Value Date/Time   NA 143 04/16/2014 0928   NA 140 08/07/2013 1149   K 3.8 04/16/2014 0928   K 4.6 08/07/2013 1149   CL 103 08/07/2013 1149   CL 109* 12/14/2012 1009   CO2 26 04/16/2014 0928   CO2 28 08/07/2013 1149   BUN 13.8 04/16/2014 0928   BUN 15 08/07/2013 1149   CREATININE  0.8 04/16/2014 0928   CREATININE 0.69 08/07/2013 1149   CREATININE 0.8 12/06/2012 1649      Component Value Date/Time   CALCIUM 9.7 04/16/2014 0928   CALCIUM 9.8 08/07/2013 1149   ALKPHOS 83 04/16/2014 0928   ALKPHOS 77 10/06/2012 1202   AST 26 04/16/2014 0928   AST 28 10/06/2012 1202   ALT 25 04/16/2014 0928   ALT 19 10/06/2012 1202   BILITOT 0.55 04/16/2014 0928   BILITOT 0.3 10/06/2012 1202       RADIOGRAPHIC STUDIES:  ASSESSMENT AND PLAN: This is a very pleasant 79 years old white female with recurrent non-Hodgkin's lymphoma, follicular center cell type currently on maintenance treatment with single agent Rituxan status post 9 cycles. The patient is  tolerating her treatment fairly well with no significant adverse effects. She would come back for followup visit in 2 months with the next cycle of her treatment. She was advised to call immediately if she has any concerning symptoms in the interval. The patient voices understanding of current disease status and treatment options and is in agreement with the current care plan.  All questions were answered. The patient knows to call the clinic with any problems, questions or concerns. We can certainly see the patient much sooner if necessary.  Disclaimer: This note was dictated with voice recognition software. Similar sounding words can inadvertently be transcribed and may not be corrected upon review.

## 2014-04-17 ENCOUNTER — Encounter: Payer: Self-pay | Admitting: Cardiovascular Disease

## 2014-04-17 ENCOUNTER — Telehealth: Payer: Self-pay | Admitting: Internal Medicine

## 2014-04-17 NOTE — Telephone Encounter (Signed)
Pt called about her sch advised pt of time she confirmed and was sent back to nurse per questions pertaining her labs.... KJ

## 2014-05-07 ENCOUNTER — Other Ambulatory Visit: Payer: Self-pay | Admitting: Family

## 2014-05-08 NOTE — Telephone Encounter (Signed)
Rx faxed to pharmacy  

## 2014-05-11 ENCOUNTER — Telehealth: Payer: Self-pay | Admitting: *Deleted

## 2014-05-11 NOTE — Telephone Encounter (Signed)
Pt is due for next Prolia injection on 06/07/14.  Can you initiate authorization?

## 2014-05-14 NOTE — Telephone Encounter (Signed)
I have sent pt's info for Prolia insurance verification and will notify you once I have a response. Thank you. °

## 2014-05-16 NOTE — Telephone Encounter (Signed)
I have rec'd pt's [employer] insurance verification.  Pt has an estimated responsibility of a $5 co-pay plus 20% co-insurance (approximately $180) for a total estimated responsibility of $185.  Please make pt aware this is an estimate and we won't know an exact amt until her insurance has paid.  I have sent a copy of the summary of benefits to be scanned into pt's chart. Note, if pt cannot afford $185 for the injection please advise her to call [employer] at [phone] to see if she qualifies for assistance from them w/her injection.  If she does, they will instruct her further.  If you have any questions, please let me know. Thank you.

## 2014-05-22 NOTE — Telephone Encounter (Signed)
Notified pt and she voices understanding. Below # given to pt. Pt wishes to wait until follow up on 07/16/14 to get Prolia injection.

## 2014-06-01 ENCOUNTER — Telehealth: Payer: Self-pay | Admitting: Cardiovascular Disease

## 2014-06-01 NOTE — Telephone Encounter (Signed)
Left message for pt to call.

## 2014-06-01 NOTE — Telephone Encounter (Signed)
[name] is calling because he pacer is not working correctly ,States that it is not doing what it supposed to do , because she still feels the irregular heart beat .[name] Please call   Thanks

## 2014-06-01 NOTE — Telephone Encounter (Signed)
Appt made for 1/27

## 2014-06-04 ENCOUNTER — Ambulatory Visit (INDEPENDENT_AMBULATORY_CARE_PROVIDER_SITE_OTHER): Payer: Medicare Other | Admitting: Cardiovascular Disease

## 2014-06-04 ENCOUNTER — Encounter: Payer: Self-pay | Admitting: Cardiovascular Disease

## 2014-06-04 VITALS — BP 130/82 | HR 67 | Resp 16 | Ht 64.0 in | Wt 142.7 lb

## 2014-06-04 DIAGNOSIS — R001 Bradycardia, unspecified: Secondary | ICD-10-CM

## 2014-06-04 DIAGNOSIS — R002 Palpitations: Secondary | ICD-10-CM | POA: Insufficient documentation

## 2014-06-04 DIAGNOSIS — I10 Essential (primary) hypertension: Secondary | ICD-10-CM

## 2014-06-04 LAB — MDC_IDC_ENUM_SESS_TYPE_INCLINIC
Brady Statistic AP VS Percent: 94.7 %
Brady Statistic AS VP Percent: 0.1 % — CL
Brady Statistic RA Percent Paced: 95 %
Brady Statistic RV Percent Paced: 0.4 %
Lead Channel Impedance Value: 488 Ohm
Lead Channel Pacing Threshold Amplitude: 0.5 V
Lead Channel Sensing Intrinsic Amplitude: 1.7 mV
Lead Channel Sensing Intrinsic Amplitude: 11.6 mV
Lead Channel Setting Pacing Amplitude: 2 V
MDC IDC MSMT BATTERY VOLTAGE: 3 V
MDC IDC MSMT LEADCHNL RA IMPEDANCE VALUE: 384 Ohm
MDC IDC MSMT LEADCHNL RA PACING THRESHOLD PULSEWIDTH: 0.4 ms
MDC IDC MSMT LEADCHNL RV PACING THRESHOLD AMPLITUDE: 1.5 V
MDC IDC MSMT LEADCHNL RV PACING THRESHOLD PULSEWIDTH: 0.2 ms
MDC IDC SET ZONE DETECTION INTERVAL: 400 ms
MDC IDC STAT BRADY AP VP PERCENT: 0.3 %
MDC IDC STAT BRADY AS VS PERCENT: 5 %
Zone Setting Detection Interval: 390 ms

## 2014-06-04 NOTE — Patient Instructions (Signed)
Remote monitoring is used to monitor your Pacemaker from home. This monitoring reduces the number of office visits required to check your device to one time per year. It allows [place] to monitor the functioning of your device to ensure it is working properly. You are scheduled for a device check from home on September 04, 2013. You may send your transmission at any time that day. If you have a wireless device, the transmission will be sent automatically. After your physician reviews your transmission, you will receive a postcard with your next transmission date.  Dr. Sallyanne Kuster recommends that you schedule a follow-up appointment in: ONE YEAR.

## 2014-06-04 NOTE — Progress Notes (Signed)
Patient ID: CHRISTIAN TREADWAY, female   DOB: April 28, 1935, 79 y.o.   MRN: [id number]     Reason for office visit Palpitations, sinus node dysfunction status post pacemaker  Mrs. Blazejewski is a 79 year old woman with symptomatic sinus node dysfunction/bradycardia for which a pacemaker was implanted in March 2013 (dual chamber Medtronic Revo, MRI conditional). She developed post procedural acute pericarditis and paroxysmal atrial fibrillation. She did not require pericardiocentesis. The symptoms subsided with anti-inflammatory therapy and the atrial fibrillation resolved. It has not recurred since. She was initially treated with antiarrhythmics but these have been since discontinued.  She has a history of non-Hodgkin's lymphoma and felt that she was "cured" but had to recently undergo a lymphadenectomy for cervical lymph node that proves to have evidence of lymphoma, probably high-grade. She is on Rituxan maintenance therapy under Dr. Worthy Flank supervision. She has mild hypertension and treated hypothyroidism.  Recently she has been bothered by palpitations, which she notices when she lies in bed at night. These isolated "missed beats". Pacemaker interrogation shows no evidence of any tachyarrhythmia. During ventricular lead threshold testing she feels palpitations. This suggests that she might be aware of the very infrequent RV pacing (0.4% of the time). She does not have true AV node disease. She paces the right atrium 95% of the time and has appropriate sensor settings with a favorable heart rate histogram distribution.  The device was reprogrammed AAIR today to see if this will lead to alleviation of her complaint.  She is physically active and exercises almost daily. Her activity rate is 4-5 hours a day, constant over the last year. She is planning a trip to [place] for the Christmas and new year holiday.   Allergies  Allergen Reactions  . Amoxicillin Hives    Hives  . Codeine Hives    REACTION:  "makes her crazy" Hives  . Demerol Hives  . Morphine And Related Hives  . Vicodin [Hydrocodone-Acetaminophen] Hives  . Sulfonamide Derivatives Rash    REACTION: Rash    Current Outpatient Prescriptions  Medication Sig Dispense Refill  . acetaminophen (TYLENOL) 500 MG tablet Take 500 mg by mouth every 6 (six) hours as needed for pain.    [name] ALPRAZolam (XANAX) 0.5 MG tablet take 1 tablet by mouth once daily if needed for sleep 30 tablet 0  . amLODipine (NORVASC) 5 MG tablet take 1 tablet by mouth once daily 30 tablet 2  . aspirin 81 MG tablet Take 81 mg by mouth daily.    . Calcium Carbonate-Vitamin D (CALTRATE 600+D) 600-400 MG-UNIT per tablet Take 1 tablet by mouth 2 (two) times daily.    [name] denosumab (PROLIA) 60 MG/ML SOLN injection Inject 60 mg into the skin every 6 (six) months. Administer in upper arm, thigh, or abdomen    . ferrous fumarate (HEMOCYTE - 106 MG FE) 325 (106 FE) MG TABS tablet Take 1 tablet by mouth.    . gabapentin (NEURONTIN) 100 MG capsule take 1 capsule by mouth daily as needed    . KRILL OIL OMEGA-3 PO Take 1 capsule by mouth daily.    [name] levothyroxine (SYNTHROID, LEVOTHROID) 100 MCG tablet take 1 tablet by mouth once daily 90 tablet 1  . omeprazole (PRILOSEC) 40 MG capsule take 1 capsule by mouth once daily 30 capsule 0  . vitamin B-12 (CYANOCOBALAMIN) 100 MCG tablet Take 100 mcg by mouth daily.     No current facility-administered medications for this visit.    Past Medical History  Diagnosis Date  .  Hypothyroidism   . Anemia     pt denies  . Osteoporosis   . Pleural effusion, bilateral     history of  . Pulmonary nodules     history of  . History of pancreatitis     pt denies  . Cystocele     history with rectocele, pt denies  . Scoliosis   . Varicose vein   . Non Hodgkin's lymphoma   . Acute bronchitis 10/09/2012  . Pacemaker 09/15/2011    Medtronic Revo  . Dysrhythmia   . Shortness of breath     with exertion  . GERD (gastroesophageal reflux  disease)   . H/O hiatal hernia   . Arthritis   . Cataract     bil  . Hemorrhoid   . Pericarditis   . Atrial fibrillation     Past Surgical History  Procedure Laterality Date  . Pacemaker insertion  09/15/11    MDT  . Tonsillectomy    . Breast surgery  07/30/01    biopsy x 3, bilateral  . Abdominal hysterectomy  04/29/04    partial  . Mass excision Right 10/28/2012    Procedure: Removal of mass on neck       ;  Surgeon: Adin Hector, MD;  Location: [facility];  Service: General;  Laterality: Right;  . Colonoscopy    . Eye surgery      cataracts    Family History  Problem Relation Age of Onset  . Heart disease Daughter     congenital "whole in her heart"  . Stroke Mother   . Lymphoma Daughter     nonhodgkins  . Stroke Father   . Breast cancer Sister   . Heart disease Brother     History   Social History  . Marital Status: Widowed    Spouse Name: N/A    Number of Children: 3  . Years of Education: N/A   Occupational History  . self employed    Social History Main Topics  . Smoking status: Never Smoker   . Smokeless tobacco: Never Used  . Alcohol Use: Yes     Comment: 1 glass of wine occasionally  . Drug Use: No  . Sexual Activity: Not on file   Other Topics Concern  . 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  She lives alone in a 3-story home.  There is a lift chair which she does not use (placed for her husband).    Review of systems: The patient specifically denies any chest pain at rest or with exertion, dyspnea at rest or with exertion, orthopnea, paroxysmal nocturnal dyspnea, syncope, focal neurological deficits, intermittent claudication, lower extremity edema, unexplained weight gain, cough, hemoptysis or wheezing.  The patient also denies abdominal pain, nausea, vomiting, dysphagia, diarrhea, constipation, polyuria, polydipsia, dysuria, hematuria, frequency,  urgency, abnormal bleeding or bruising, fever, chills, unexpected weight changes, mood swings, change in skin or hair texture, change in voice quality, auditory or visual problems, allergic reactions or rashes, new musculoskeletal complaints other than usual "aches and pains".   PHYSICAL EXAM BP 130/82 mmHg  Pulse 67  Resp 16  Ht 5\' 4"  (1.626 m)  Wt 142 lb 11.2 oz (64.728 kg)  BMI 24.48 kg/m2  General: Alert, oriented x3, no distress Head: no evidence of trauma, PERRL, EOMI, no exophtalmos or lid lag, no myxedema, no xanthelasma; normal ears, nose and oropharynx Neck: normal  jugular venous pulsations and no hepatojugular reflux; brisk carotid pulses without delay and no carotid bruits Chest: clear to auscultation, no signs of consolidation by percussion or palpation, normal fremitus, symmetrical and full respiratory excursions. Healthy left subclavian pacemaker site Cardiovascular: normal position and quality of the apical impulse, regular rhythm, normal first and second heart sounds, no murmurs, rubs or gallops Abdomen: no tenderness or distention, no masses by palpation, no abnormal pulsatility or arterial bruits, normal bowel sounds, no hepatosplenomegaly Extremities: no clubbing, cyanosis or edema; 2+ radial, ulnar and brachial pulses bilaterally; 2+ right femoral, posterior tibial and dorsalis pedis pulses; 2+ left femoral, posterior tibial and dorsalis pedis pulses; no subclavian or femoral bruits Neurological: grossly nonfocal   EKG: Atrial paced ventricular sensed otherwise normal  Lipid Panel     Component Value Date/Time   CHOL 238* 10/06/2012 1202   TRIG 199* 10/06/2012 1202   HDL 53 10/06/2012 1202   CHOLHDL 4.5 10/06/2012 1202   VLDL 40 10/06/2012 1202   LDLCALC 145* 10/06/2012 1202    BMET    Component Value Date/Time   NA 143 04/16/2014 0928   NA 140 08/07/2013 1149   K 3.8 04/16/2014 0928   K 4.6 08/07/2013 1149   CL 103 08/07/2013 1149   CL 109* 12/14/2012  1009   CO2 26 04/16/2014 0928   CO2 28 08/07/2013 1149   GLUCOSE 86 04/16/2014 0928   GLUCOSE 77 08/07/2013 1149   GLUCOSE 64* 12/14/2012 1009   BUN 13.8 04/16/2014 0928   BUN 15 08/07/2013 1149   CREATININE 0.8 04/16/2014 0928   CREATININE 0.69 08/07/2013 1149   CREATININE 0.8 12/06/2012 1649   CALCIUM 9.7 04/16/2014 0928   CALCIUM 9.8 08/07/2013 1149   GFRNONAA 66* 10/25/2012 1036   GFRNONAA >60 10/16/2010 1013   GFRAA 76* 10/25/2012 1036   GFRAA >60 10/16/2010 1013     ASSESSMENT AND PLAN  Normal dual chamber permanent pacemaker function. She has exclusive sinus node dysfunction and the device was reprogrammed AAIR to avoid the occasional isolated ventricular paced beats that seem to be causing her palpitations. If she continues to have palpitations despite this, will have her wear a 24-hour Holter monitor to identify the cause of her symptoms, which may be isolated premature atrial or premature ventricular beats.  Holli Humbles, MD, [facility] [phone] office [phone] pager

## 2014-06-05 ENCOUNTER — Encounter: Payer: Self-pay | Admitting: Internal Medicine

## 2014-06-05 ENCOUNTER — Ambulatory Visit (HOSPITAL_BASED_OUTPATIENT_CLINIC_OR_DEPARTMENT_OTHER): Payer: Medicare Other | Admitting: Internal Medicine

## 2014-06-05 ENCOUNTER — Other Ambulatory Visit (HOSPITAL_BASED_OUTPATIENT_CLINIC_OR_DEPARTMENT_OTHER): Payer: Medicare Other

## 2014-06-05 ENCOUNTER — Telehealth: Payer: Self-pay | Admitting: Internal Medicine

## 2014-06-05 ENCOUNTER — Telehealth: Payer: Self-pay | Admitting: *Deleted

## 2014-06-05 ENCOUNTER — Ambulatory Visit (HOSPITAL_BASED_OUTPATIENT_CLINIC_OR_DEPARTMENT_OTHER): Payer: Medicare Other

## 2014-06-05 VITALS — BP 134/83 | HR 86 | Temp 97.8°F | Resp 18 | Ht 64.0 in | Wt 141.1 lb

## 2014-06-05 DIAGNOSIS — C8291 Follicular lymphoma, unspecified, lymph nodes of head, face, and neck: Secondary | ICD-10-CM

## 2014-06-05 DIAGNOSIS — Z5112 Encounter for antineoplastic immunotherapy: Secondary | ICD-10-CM

## 2014-06-05 DIAGNOSIS — C8590 Non-Hodgkin lymphoma, unspecified, unspecified site: Secondary | ICD-10-CM

## 2014-06-05 LAB — CBC WITH DIFFERENTIAL/PLATELET
BASO%: 1.9 % (ref 0.0–2.0)
BASOS ABS: 0.1 10*3/uL (ref 0.0–0.1)
EOS ABS: 0.4 10*3/uL (ref 0.0–0.5)
EOS%: 5.7 % (ref 0.0–7.0)
HCT: 44 % (ref 34.8–46.6)
HEMOGLOBIN: 14.4 g/dL (ref 11.6–15.9)
LYMPH%: 27.3 % (ref 14.0–49.7)
MCH: 31.5 pg (ref 25.1–34.0)
MCHC: 32.8 g/dL (ref 31.5–36.0)
MCV: 95.9 fL (ref 79.5–101.0)
MONO#: 0.7 10*3/uL (ref 0.1–0.9)
MONO%: 10.8 % (ref 0.0–14.0)
NEUT%: 54.3 % (ref 38.4–76.8)
NEUTROS ABS: 3.4 10*3/uL (ref 1.5–6.5)
Platelets: 255 10*3/uL (ref 145–400)
RBC: 4.59 10*6/uL (ref 3.70–5.45)
RDW: 14.1 % (ref 11.2–14.5)
WBC: 6.3 10*3/uL (ref 3.9–10.3)
lymph#: 1.7 10*3/uL (ref 0.9–3.3)

## 2014-06-05 LAB — COMPREHENSIVE METABOLIC PANEL (CC13)
ALBUMIN: 4.1 g/dL (ref 3.5–5.0)
ALK PHOS: 74 U/L (ref 40–150)
ALT: 15 U/L (ref 0–55)
ANION GAP: 9 meq/L (ref 3–11)
AST: 22 U/L (ref 5–34)
BUN: 16.1 mg/dL (ref 7.0–26.0)
CO2: 23 meq/L (ref 22–29)
Calcium: 9.6 mg/dL (ref 8.4–10.4)
Chloride: 108 mEq/L (ref 98–109)
Creatinine: 0.8 mg/dL (ref 0.6–1.1)
EGFR: 74 mL/min/{1.73_m2} — AB (ref >90)
GLUCOSE: 79 mg/dL (ref 70–140)
POTASSIUM: 4.2 meq/L (ref 3.5–5.1)
SODIUM: 140 meq/L (ref 136–145)
Total Bilirubin: 0.69 mg/dL (ref 0.20–1.20)
Total Protein: 6.8 g/dL (ref 6.4–8.3)

## 2014-06-05 LAB — LACTATE DEHYDROGENASE (CC13): LDH: 256 U/L — ABNORMAL HIGH (ref 125–245)

## 2014-06-05 MED ORDER — SODIUM CHLORIDE 0.9 % IV SOLN
375.0000 mg/m2 | Freq: Once | INTRAVENOUS | Status: AC
  Administered 2014-06-05: 600 mg via INTRAVENOUS
  Filled 2014-06-05: qty 60

## 2014-06-05 MED ORDER — SODIUM CHLORIDE 0.9 % IV SOLN
Freq: Once | INTRAVENOUS | Status: AC
  Administered 2014-06-05: 11:00:00 via INTRAVENOUS

## 2014-06-05 MED ORDER — ACETAMINOPHEN 325 MG PO TABS
650.0000 mg | ORAL_TABLET | Freq: Once | ORAL | Status: AC
  Administered 2014-06-05: 650 mg via ORAL

## 2014-06-05 MED ORDER — DIPHENHYDRAMINE HCL 25 MG PO CAPS
ORAL_CAPSULE | ORAL | Status: AC
  Filled 2014-06-05: qty 2

## 2014-06-05 MED ORDER — DIPHENHYDRAMINE HCL 25 MG PO CAPS
50.0000 mg | ORAL_CAPSULE | Freq: Once | ORAL | Status: AC
  Administered 2014-06-05: 50 mg via ORAL

## 2014-06-05 MED ORDER — ACETAMINOPHEN 325 MG PO TABS
ORAL_TABLET | ORAL | Status: AC
  Filled 2014-06-05: qty 2

## 2014-06-05 NOTE — Progress Notes (Signed)
[facility] Telephone:([phone]) [phone]   Fax:(336) [phone]  OFFICE PROGRESS NOTE  [name], NP [place] [address]  DIAGNOSIS: recurrent Non Hodgkin's lymphoma, follicular center cell type with a predominant follicular pattern.Grade (if applicable): Favor high grade (grade 3/3), initially diagnosed in November of 2012   PRIOR THERAPY:  1) Status post treatment with Rituxan weekly for 3 doses in addition to 3 tablets of Afinitor at [facility] in [place].  2) Status post surgical excision of the right neck mass under the care of Dr. Johney Maine on 10/28/2012.   CURRENT THERAPY: Maintenance Rituxan 375 mg/M2 every 2 months status post 9 cycles of his dose was given on 12/14/2012.  ADVANCED DIRECTIVE: Does not have advanced directive.   INTERVAL HISTORY: [name] 79 y.o. female returns to the clinic today for 73-month followup visit. The patient is tolerating her current maintenance treatment with Rituxan fairly well. She is planning to travel to [place] for 10 days during the Christmas holiday. She denied having any significant complaints since her last visit. She has no palpable lymphadenopathy. She denied having any significant nausea or vomiting. She has no fever or chills. The patient denied having any significant weight loss or night sweats. She has no chest pain, shortness of breath, cough or hemoptysis. She is here today to start cycle #10 of her treatment.  MEDICAL HISTORY: Past Medical History  Diagnosis Date  . Hypothyroidism   . Anemia     pt denies  . Osteoporosis   . Pleural effusion, bilateral     history of  . Pulmonary nodules     history of  . History of pancreatitis     pt denies  . Cystocele     history with rectocele, pt denies  . Scoliosis   . Varicose vein   . Non Hodgkin's lymphoma   . Acute bronchitis 10/09/2012  . Pacemaker 09/15/2011    Medtronic Revo  . Dysrhythmia   . Shortness of  breath     with exertion  . GERD (gastroesophageal reflux disease)   . H/O hiatal hernia   . Arthritis   . Cataract     bil  . Hemorrhoid   . Pericarditis   . Atrial fibrillation     ALLERGIES:  is allergic to amoxicillin; codeine; demerol; morphine and related; vicodin; and sulfonamide derivatives.  MEDICATIONS:  Current Outpatient Prescriptions  Medication Sig Dispense Refill  . acetaminophen (TYLENOL) 500 MG tablet Take 500 mg by mouth every 6 (six) hours as needed for pain.      [name] ALPRAZolam (XANAX) 0.5 MG tablet take 1 tablet by mouth once daily if needed for sleep  30 tablet  0  . amLODipine (NORVASC) 10 MG tablet take 1 tablet by mouth once daily  DUE FOR A FOLLOW-UP OFFICE VISIT  90 tablet  1  . aspirin 81 MG tablet Take 81 mg by mouth daily.      . Calcium Carbonate-Vitamin D (CALTRATE 600+D) 600-400 MG-UNIT per tablet Take 1 tablet by mouth 2 (two) times daily.      [name] denosumab (PROLIA) 60 MG/ML SOLN injection Inject 60 mg into the skin every 6 (six) months. Administer in upper arm, thigh, or abdomen      . ferrous fumarate (HEMOCYTE - 106 MG FE) 325 (106 FE) MG TABS tablet Take 1 tablet by mouth.      . gabapentin (NEURONTIN) 100 MG capsule take  1 capsule by mouth three times a day  90 capsule  2  . KRILL OIL OMEGA-3 PO Take 1 capsule by mouth daily.      [name] levothyroxine (SYNTHROID, LEVOTHROID) 100 MCG tablet take 1 tablet by mouth once daily  90 tablet  1  . omeprazole (PRILOSEC) 40 MG capsule Take 1 capsule (40 mg total) by mouth daily.  180 capsule  1  . vitamin B-12 (CYANOCOBALAMIN) 100 MCG tablet Take 100 mcg by mouth daily.       No current facility-administered medications for this visit.    SURGICAL HISTORY:  Past Surgical History  Procedure Laterality Date  . Pacemaker insertion  09/15/11    MDT  . Tonsillectomy    . Breast surgery  07/30/01    biopsy x 3, bilateral  . Abdominal hysterectomy  04/29/04    partial  . Mass excision Right 10/28/2012    Procedure:  Removal of mass on neck       ;  Surgeon: Adin Hector, MD;  Location: [facility];  Service: General;  Laterality: Right;  . Colonoscopy    . Eye surgery      cataracts    REVIEW OF SYSTEMS:  Constitutional: negative Eyes: negative Ears, nose, mouth, throat, and face: negative Respiratory: negative Cardiovascular: negative Gastrointestinal: negative Genitourinary:negative Integument/breast: negative Hematologic/lymphatic: negative Musculoskeletal:negative Neurological: negative Behavioral/Psych: negative Endocrine: negative Allergic/Immunologic: negative   PHYSICAL EXAMINATION: General appearance: alert, cooperative and no distress Head: Normocephalic, without obvious abnormality, atraumatic Neck: no adenopathy, no JVD, supple, symmetrical, trachea midline and thyroid not enlarged, symmetric, no tenderness/mass/nodules Lymph nodes: Cervical, supraclavicular, and axillary nodes normal. Resp: clear to auscultation bilaterally Back: symmetric, no curvature. ROM normal. No CVA tenderness. Cardio: regular rate and rhythm, S1, S2 normal, no murmur, click, rub or gallop GI: soft, non-tender; bowel sounds normal; no masses,  no organomegaly Extremities: extremities normal, atraumatic, no cyanosis or edema Neurologic: Alert and oriented X 3, normal strength and tone. Normal symmetric reflexes. Normal coordination and gait  ECOG PERFORMANCE STATUS: 0 - Asymptomatic  Blood pressure 134/83, pulse 86, temperature 97.8 F (36.6 C), temperature source Oral, resp. rate 18, height 5\' 4"  (1.626 m), weight 141 lb 1.6 oz (64.003 kg), SpO2 100 %.  LABORATORY DATA: Lab Results  Component Value Date   WBC 6.3 06/05/2014   HGB 14.4 06/05/2014   HCT 44.0 06/05/2014   MCV 95.9 06/05/2014   PLT 255 06/05/2014      Chemistry      Component Value Date/Time   NA 140 06/05/2014 0920   NA 140 08/07/2013 1149   K 4.2 06/05/2014 0920   K 4.6 08/07/2013 1149   CL 103 08/07/2013 1149   CL 109*  12/14/2012 1009   CO2 23 06/05/2014 0920   CO2 28 08/07/2013 1149   BUN 16.1 06/05/2014 0920   BUN 15 08/07/2013 1149   CREATININE 0.8 06/05/2014 0920   CREATININE 0.69 08/07/2013 1149   CREATININE 0.8 12/06/2012 1649      Component Value Date/Time   CALCIUM 9.6 06/05/2014 0920   CALCIUM 9.8 08/07/2013 1149   ALKPHOS 74 06/05/2014 0920   ALKPHOS 77 10/06/2012 1202   AST 22 06/05/2014 0920   AST 28 10/06/2012 1202   ALT 15 06/05/2014 0920   ALT 19 10/06/2012 1202   BILITOT 0.69 06/05/2014 0920   BILITOT 0.3 10/06/2012 1202       RADIOGRAPHIC STUDIES:  ASSESSMENT AND PLAN: This is a very pleasant 79 years old white  female with recurrent non-Hodgkin's lymphoma, follicular center cell type currently on maintenance treatment with single agent Rituxan status post 9 cycles. The patient is tolerating her treatment fairly well with no significant adverse effects. We will proceed with cycle #10 today as a scheduled. She would come back for followup visit in 2 months with the next cycle of her treatment. She was advised to call immediately if she has any concerning symptoms in the interval. The patient voices understanding of current disease status and treatment options and is in agreement with the current care plan. All questions were answered. The patient knows to call the clinic with any problems, questions or concerns. We can certainly see the patient much sooner if necessary.  Disclaimer: This note was dictated with voice recognition software. Similar sounding words can inadvertently be transcribed and may not be corrected upon review.

## 2014-06-05 NOTE — Patient Instructions (Signed)
[facility] Discharge Instructions for Patients Receiving Chemotherapy  Today you received the following chemotherapy agents: rituximab  To help prevent nausea and vomiting after your treatment, we encourage you to take your nausea medication.  Take it as often as prescribed.     If you develop nausea and vomiting that is not controlled by your nausea medication, call the clinic. If it is after clinic hours your family physician or the after hours number for the clinic or go to the Emergency Department.   BELOW ARE SYMPTOMS THAT SHOULD BE REPORTED IMMEDIATELY:  *FEVER GREATER THAN 100.5 F  *CHILLS WITH OR WITHOUT FEVER  NAUSEA AND VOMITING THAT IS NOT CONTROLLED WITH YOUR NAUSEA MEDICATION  *UNUSUAL SHORTNESS OF BREATH  *UNUSUAL BRUISING OR BLEEDING  TENDERNESS IN MOUTH AND THROAT WITH OR WITHOUT PRESENCE OF ULCERS  *URINARY PROBLEMS  *BOWEL PROBLEMS  UNUSUAL RASH Items with * indicate a potential emergency and should be followed up as soon as possible.  Feel free to call the clinic you have any questions or concerns. The clinic phone number is [phone].   I have been informed and understand all the instructions given to me. I know to contact the clinic, my physician, or go to the Emergency Department if any problems should occur. I do not have any questions at this time, but understand that I may call the clinic during office hours   should I have any questions or need assistance in obtaining follow up care.    __________________________________________  _____________  __________ Signature of Patient or Authorized Representative            Date                   Time    __________________________________________ Nurse's Signature

## 2014-06-05 NOTE — Telephone Encounter (Signed)
Per staff message and POF I have scheduled appts. Advised scheduler of appts. JMW  

## 2014-06-05 NOTE — Telephone Encounter (Signed)
Gave avs & cal for Feb 2016. Sent mess to sch tx.

## 2014-06-07 ENCOUNTER — Encounter (HOSPITAL_COMMUNITY): Payer: Self-pay | Admitting: Cardiovascular Disease

## 2014-06-15 ENCOUNTER — Encounter: Payer: Self-pay | Admitting: Cardiovascular Disease

## 2014-06-18 ENCOUNTER — Ambulatory Visit (INDEPENDENT_AMBULATORY_CARE_PROVIDER_SITE_OTHER): Payer: Medicare Other | Admitting: *Deleted

## 2014-06-18 ENCOUNTER — Encounter: Payer: Self-pay | Admitting: *Deleted

## 2014-06-18 ENCOUNTER — Telehealth: Payer: Self-pay | Admitting: Cardiovascular Disease

## 2014-06-18 DIAGNOSIS — R002 Palpitations: Secondary | ICD-10-CM

## 2014-06-18 NOTE — Telephone Encounter (Signed)
Please call,still having irregular heart beats,it is getting worse.

## 2014-06-18 NOTE — Telephone Encounter (Signed)
Spoke with pt, she will go to the [address] location today at 2pm to get a monitor.

## 2014-06-18 NOTE — Telephone Encounter (Signed)
Spoke with pt, she reports the palpitations have gotten worse since she was here. She states they are lasting longer and are harder than before. At her last office visit there were changes made in her device. I spoke with the device clinic and they do not feel she needs her device interrogated today. Per dr croitoru's last office note if the patient continued to have problems he was going to order a 24 hour monitor.

## 2014-06-18 NOTE — Progress Notes (Signed)
Patient ID: [name], female   DOB: 05/09/1935, 79 y.o.   MRN: [id number] [facility] 24 hour holter monitor applied to patient.

## 2014-06-20 ENCOUNTER — Telehealth: Payer: Self-pay | Admitting: *Deleted

## 2014-06-25 NOTE — Telephone Encounter (Signed)
Pt. Called and informed about her monitor per Dr. Rosezella [place] request

## 2014-07-03 ENCOUNTER — Other Ambulatory Visit: Payer: Self-pay | Admitting: Family

## 2014-07-03 NOTE — Telephone Encounter (Signed)
Please advise refill:          Medication name:  Name from pharmacy:  ALPRAZolam (XANAX) 0.5 MG tablet ALPRAZOLAM 0.5 MG TABLET    Sig: take 1 tablet by mouth once daily if needed for sleep    Dispense: 30 tablet (Pharmacy requested 30)   Refills: 0   Start: 07/03/2014   Class: Normal    Requested on: 05/08/2014    Originally ordered on: 08/07/2013 05/08/2014

## 2014-07-03 NOTE — Telephone Encounter (Signed)
Ok to send 30 tabs zero refills. 

## 2014-07-03 NOTE — Telephone Encounter (Signed)
Refill called to pharmacy voicemail. 

## 2014-07-14 DIAGNOSIS — J209 Acute bronchitis, unspecified: Secondary | ICD-10-CM | POA: Diagnosis not present

## 2014-07-14 DIAGNOSIS — J019 Acute sinusitis, unspecified: Secondary | ICD-10-CM | POA: Diagnosis not present

## 2014-07-16 ENCOUNTER — Ambulatory Visit: Payer: Medicare Other | Admitting: Family

## 2014-07-18 ENCOUNTER — Ambulatory Visit (INDEPENDENT_AMBULATORY_CARE_PROVIDER_SITE_OTHER): Payer: Medicare Other | Admitting: Family

## 2014-07-18 ENCOUNTER — Ambulatory Visit (HOSPITAL_BASED_OUTPATIENT_CLINIC_OR_DEPARTMENT_OTHER)
Admission: RE | Admit: 2014-07-18 | Discharge: 2014-07-18 | Disposition: A | Discharge status: Home / Self Care | Payer: Medicare Other | Source: Ambulatory Visit | Attending: Family | Admitting: Family

## 2014-07-18 ENCOUNTER — Encounter: Payer: Self-pay | Admitting: Family

## 2014-07-18 VITALS — BP 118/78 | HR 73 | Temp 97.9°F | Resp 16 | Ht 64.0 in | Wt 145.0 lb

## 2014-07-18 DIAGNOSIS — K449 Diaphragmatic hernia without obstruction or gangrene: Secondary | ICD-10-CM | POA: Diagnosis not present

## 2014-07-18 DIAGNOSIS — C8590 Non-Hodgkin lymphoma, unspecified, unspecified site: Secondary | ICD-10-CM | POA: Diagnosis not present

## 2014-07-18 DIAGNOSIS — E039 Hypothyroidism, unspecified: Secondary | ICD-10-CM

## 2014-07-18 DIAGNOSIS — I10 Essential (primary) hypertension: Secondary | ICD-10-CM

## 2014-07-18 DIAGNOSIS — R05 Cough: Secondary | ICD-10-CM | POA: Insufficient documentation

## 2014-07-18 DIAGNOSIS — I517 Cardiomegaly: Secondary | ICD-10-CM | POA: Insufficient documentation

## 2014-07-18 DIAGNOSIS — I4891 Unspecified atrial fibrillation: Secondary | ICD-10-CM | POA: Diagnosis not present

## 2014-07-18 DIAGNOSIS — Z79899 Other long term (current) drug therapy: Secondary | ICD-10-CM | POA: Diagnosis not present

## 2014-07-18 DIAGNOSIS — J209 Acute bronchitis, unspecified: Secondary | ICD-10-CM

## 2014-07-18 DIAGNOSIS — Z8572 Personal history of non-Hodgkin lymphomas: Secondary | ICD-10-CM | POA: Insufficient documentation

## 2014-07-18 DIAGNOSIS — Z950 Presence of cardiac pacemaker: Secondary | ICD-10-CM | POA: Insufficient documentation

## 2014-07-18 DIAGNOSIS — R509 Fever, unspecified: Secondary | ICD-10-CM | POA: Diagnosis not present

## 2014-07-18 DIAGNOSIS — R059 Cough, unspecified: Secondary | ICD-10-CM

## 2014-07-18 LAB — TSH: TSH: 5.06 u[IU]/mL — AB (ref 0.35–4.50)

## 2014-07-18 MED ORDER — BENZONATATE 100 MG PO CAPS: 100.0000 mg | ORAL_CAPSULE | Freq: Three times a day (TID) | ORAL | Status: DC | PRN

## 2014-07-18 MED ORDER — DOXYCYCLINE HYCLATE 100 MG PO TABS: 100.0000 mg | ORAL_TABLET | Freq: Two times a day (BID) | ORAL | Status: DC

## 2014-07-18 NOTE — Progress Notes (Signed)
Subjective:    Patient ID: [name], female    DOB: [birth year], 80 y.o.   MRN: [id number]  HPI  [name] is a 80 yr old female who presents today for follow up of multiple medical problems:  1) HTN- Patient is currently maintained on the following medications for blood pressure: amlodipine. Patient reports good compliance with blood pressure medications. Patient denies chest pain, shortness of breath or swelling. Last 3 blood pressure readings in our office are as follows:   BP Readings from Last 3 Encounters:  07/18/14 118/78  06/05/14 108/66  06/05/14 134/83   2) Hypothyroid- maintained on synthroid.  Lab Results  Component Value Date   TSH 0.864 12/06/2013   3) Nasal congestion- reports nasal congestion, sinus drainage, + fever (was 102 on Saturday), headache since Friday. Tried otc gel caps for cold without relief.  Reports + tinnitus.    4) NHL-  She is followed by Dr. Inda Merlin. She is maintained on Rituxan. She has 2 more doses to complete her 10th round of rituxan.    Review of Systems See HPI  Past Medical History  Diagnosis Date  . Hypothyroidism   . Anemia     pt denies  . Osteoporosis   . Pleural effusion, bilateral     history of  . Pulmonary nodules     history of  . History of pancreatitis     pt denies  . Cystocele     history with rectocele, pt denies  . Scoliosis   . Varicose vein   . Non Hodgkin's lymphoma   . Acute bronchitis 10/09/2012  . Pacemaker 09/15/2011    Medtronic Revo  . Dysrhythmia   . Shortness of breath     with exertion  . GERD (gastroesophageal reflux disease)   . H/O hiatal hernia   . Arthritis   . Cataract     bil  . Hemorrhoid   . Pericarditis   . Atrial fibrillation     History   Social History  . Marital Status: Widowed    Spouse Name: N/A    Number of Children: 3  . Years of Education: N/A   Occupational History  . self employed    Social History Main Topics  . Smoking status: Never Smoker    . Smokeless tobacco: Never Used  . Alcohol Use: Yes     Comment: 1 glass of wine occasionally  . Drug Use: No  . Sexual Activity: Not on file   Other Topics Concern  . 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  She lives alone in a 3-story home.  There is a lift chair which she does not use (placed for her husband).    Past Surgical History  Procedure Laterality Date  . Pacemaker insertion  09/15/11    MDT  . Tonsillectomy    . Breast surgery  07/30/01    biopsy x 3, bilateral  . Abdominal hysterectomy  04/29/04    partial  . Mass excision Right 10/28/2012    Procedure: Removal of mass on neck       ;  Surgeon: Adin Hector, MD;  Location: [facility];  Service: General;  Laterality: Right;  . Colonoscopy    . Eye surgery      cataracts  . Permanent pacemaker insertion N/A 09/15/2011    Procedure: PERMANENT PACEMAKER INSERTION;  Surgeon: Sanda Klein, MD;  Location: [facility];  Service: Cardiovascular;  Laterality: N/A;    Family History  Problem Relation Age of Onset  . Heart disease Daughter     congenital "whole in her heart"  . Stroke Mother   . Lymphoma Daughter     nonhodgkins  . Stroke Father   . Breast cancer Sister   . Heart disease Brother     Allergies  Allergen Reactions  . Amoxicillin Hives    Hives  . Codeine Hives    REACTION: "makes her crazy" Hives  . Demerol Hives  . Morphine And Related Hives  . Vicodin [Hydrocodone-Acetaminophen] Hives  . Sulfonamide Derivatives Rash    REACTION: Rash    Current Outpatient Prescriptions on File Prior to Visit  Medication Sig Dispense Refill  . acetaminophen (TYLENOL) 500 MG tablet Take 500 mg by mouth every 6 (six) hours as needed for pain.    [name] ALPRAZolam (XANAX) 0.5 MG tablet take 1 tablet by mouth once daily if needed for sleep 30 tablet 0  . amLODipine (NORVASC) 5 MG tablet take 1 tablet by mouth once daily 30  tablet 2  . aspirin 81 MG tablet Take 81 mg by mouth daily.    . Calcium Carbonate-Vitamin D (CALTRATE 600+D) 600-400 MG-UNIT per tablet Take 1 tablet by mouth 2 (two) times daily.    [name] denosumab (PROLIA) 60 MG/ML SOLN injection Inject 60 mg into the skin every 6 (six) months. Administer in upper arm, thigh, or abdomen    . ferrous fumarate (HEMOCYTE - 106 MG FE) 325 (106 FE) MG TABS tablet Take 1 tablet by mouth.    . gabapentin (NEURONTIN) 100 MG capsule take 1 capsule by mouth daily as needed    . KRILL OIL OMEGA-3 PO Take 1 capsule by mouth daily.    [name] levothyroxine (SYNTHROID, LEVOTHROID) 100 MCG tablet take 1 tablet by mouth once daily 90 tablet 1  . omeprazole (PRILOSEC) 40 MG capsule take 1 capsule by mouth once daily 30 capsule 2  . vitamin B-12 (CYANOCOBALAMIN) 100 MCG tablet Take 100 mcg by mouth daily.     No current facility-administered medications on file prior to visit.    BP 118/78 mmHg  Pulse 73  Temp(Src) 97.9 F (36.6 C) (Oral)  Resp 16  Ht 5\' 4"  (1.626 m)  Wt 145 lb (65.772 kg)  BMI 24.88 kg/m2  SpO2 95%       Objective:   Physical Exam  Constitutional: She is oriented to person, place, and time. She appears well-developed and well-nourished. No distress.  HENT:  Head: Normocephalic and atraumatic.  Cardiovascular: Normal rate and regular rhythm.   No murmur heard. Pulmonary/Chest: Effort normal and breath sounds normal. No respiratory distress. She has no wheezes. She has no rales. She exhibits no tenderness.  Neurological: She is alert and oriented to person, place, and time.  Psychiatric: She has a normal mood and affect. Her behavior is normal. Judgment and thought content normal.          Assessment & Plan:

## 2014-07-18 NOTE — Patient Instructions (Addendum)
Please complete lab work prior to leaving. Start tessalon as needed for cough. Start doxycycline for sinus infeciton.  Complete chest x ray on the first floor. Follow up in 3 months.

## 2014-07-18 NOTE — Progress Notes (Signed)
Pre visit review using our clinic review tool, if applicable. No additional management support is needed unless otherwise documented below in the visit note. 

## 2014-07-21 ENCOUNTER — Telehealth: Payer: Self-pay | Admitting: Family

## 2014-07-21 DIAGNOSIS — J209 Acute bronchitis, unspecified: Secondary | ICD-10-CM | POA: Insufficient documentation

## 2014-07-21 MED ORDER — LEVOTHYROXINE SODIUM 112 MCG PO TABS: 112.0000 ug | ORAL_TABLET | Freq: Every day | ORAL | Status: DC

## 2014-07-21 NOTE — Assessment & Plan Note (Signed)
Given that she is on rituxan and has had fever, will initiate empiric abx for bronchitis. CXR is negative for pneumonia.

## 2014-07-21 NOTE — Assessment & Plan Note (Signed)
BP stable on amlodipine, continue same.

## 2014-07-21 NOTE — Assessment & Plan Note (Signed)
Lab Results  Component Value Date   TSH 5.06* 07/18/2014   TSH elevated, increase synthroid from 181mcg to 112 mcg.

## 2014-07-21 NOTE — Telephone Encounter (Signed)
Lab work shows that her synthroid should be increased.  New dose sent to [facility], repeat tsh in 6 weeks, dx hypothyroid.

## 2014-07-21 NOTE — Assessment & Plan Note (Signed)
Management per oncology. 

## 2014-07-23 NOTE — Telephone Encounter (Signed)
Spoke with pt, she is unsure if she has had any fever. Scheduled pt appt for tomorrow at 1:45pm and ok per verbal from PCP.  Advised pt if she develops SOB to go to the ER for evaluation and she voices understanding.

## 2014-07-23 NOTE — Telephone Encounter (Signed)
Pt needs re-evaluation today in the office please.

## 2014-07-23 NOTE — Telephone Encounter (Signed)
Notified pt and she voices understanding.  Lab order entered for 02/03/15 and appt made.  Pt states that she is not feeling any improvement from her previous visit. Reports that she has been in the bed since we saw her last and has a deep cough. Feels bad. Advised pt that recent CXR negative for pneumonia. Pt states she has a few more doxycycline to take but is feeling worse now. Please advise.

## 2014-07-24 ENCOUNTER — Ambulatory Visit (INDEPENDENT_AMBULATORY_CARE_PROVIDER_SITE_OTHER): Payer: Medicare Other | Admitting: Family

## 2014-07-24 ENCOUNTER — Encounter: Payer: Self-pay | Admitting: Family

## 2014-07-24 VITALS — BP 120/80 | HR 76 | Temp 98.1°F | Resp 16 | Ht 64.0 in | Wt 145.0 lb

## 2014-07-24 DIAGNOSIS — J209 Acute bronchitis, unspecified: Secondary | ICD-10-CM | POA: Diagnosis not present

## 2014-07-24 NOTE — Assessment & Plan Note (Signed)
Resolving, much improved today. Advised pt to complete doxy, call if symptoms do not continue to improve.

## 2014-07-24 NOTE — Patient Instructions (Signed)
Please complete doxycycline. Call if symptoms do not continue to improve.

## 2014-07-24 NOTE — Progress Notes (Signed)
Subjective:    Patient ID: [name], female    DOB: 09/22/1934, 80 y.o.   MRN: [id number]  HPI  [name] is a 80 yr old female who presents today for follow up of her cough. She was last seen on 07/18/14 and was treated for bronchitis with doxycycline.  She had a chest x ray at that time.  She reports that her cough is improving and she is now able to bring some "stuff up" from her chest.  She has 3 more days worth of doxycline.   She reports that she was "in the bed for 4 days."  She did have some diarrhea and lower abdominal cramping x 3 days but this has resolved.    Review of Systems See HPI  Past Medical History  Diagnosis Date  . Hypothyroidism   . Anemia     pt denies  . Osteoporosis   . Pleural effusion, bilateral     history of  . Pulmonary nodules     history of  . History of pancreatitis     pt denies  . Cystocele     history with rectocele, pt denies  . Scoliosis   . Varicose vein   . Non Hodgkin's lymphoma   . Acute bronchitis 10/09/2012  . Pacemaker 09/15/2011    Medtronic Revo  . Dysrhythmia   . Shortness of breath     with exertion  . GERD (gastroesophageal reflux disease)   . H/O hiatal hernia   . Arthritis   . Cataract     bil  . Hemorrhoid   . Pericarditis   . Atrial fibrillation     History   Social History  . Marital Status: Widowed    Spouse Name: N/A    Number of Children: 3  . Years of Education: N/A   Occupational History  . self employed    Social History Main Topics  . Smoking status: Never Smoker   . Smokeless tobacco: Never Used  . Alcohol Use: Yes     Comment: 1 glass of wine occasionally  . Drug Use: No  . Sexual Activity: Not on file   Other Topics Concern  . 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  She lives alone in a 3-story home.  There is a lift chair which she does not use (placed for her husband).    Past  Surgical History  Procedure Laterality Date  . Pacemaker insertion  09/15/11    MDT  . Tonsillectomy    . Breast surgery  07/30/01    biopsy x 3, bilateral  . Abdominal hysterectomy  04/29/04    partial  . Mass excision Right 10/28/2012    Procedure: Removal of mass on neck       ;  Surgeon: Adin Hector, MD;  Location: [facility];  Service: General;  Laterality: Right;  . Colonoscopy    . Eye surgery      cataracts  . Permanent pacemaker insertion N/A 09/15/2011    Procedure: PERMANENT PACEMAKER INSERTION;  Surgeon: Sanda Klein, MD;  Location: [facility];  Service: Cardiovascular;  Laterality: N/A;    Family History  Problem Relation Age of Onset  . Heart disease Daughter     congenital "whole in her heart"  . Stroke Mother   . Lymphoma Daughter     nonhodgkins  . Stroke Father   .  Breast cancer Sister   . Heart disease Brother     Allergies  Allergen Reactions  . Amoxicillin Hives    Hives  . Codeine Hives    REACTION: "makes her crazy" Hives  . Demerol Hives  . Morphine And Related Hives  . Vicodin [Hydrocodone-Acetaminophen] Hives  . Sulfonamide Derivatives Rash    REACTION: Rash    Current Outpatient Prescriptions on File Prior to Visit  Medication Sig Dispense Refill  . acetaminophen (TYLENOL) 500 MG tablet Take 500 mg by mouth every 6 (six) hours as needed for pain.    [name] ALPRAZolam (XANAX) 0.5 MG tablet take 1 tablet by mouth once daily if needed for sleep 30 tablet 0  . amLODipine (NORVASC) 5 MG tablet take 1 tablet by mouth once daily 30 tablet 2  . aspirin 81 MG tablet Take 81 mg by mouth daily.    . benzonatate (TESSALON) 100 MG capsule Take 1 capsule (100 mg total) by mouth 3 (three) times daily as needed. 20 capsule 0  . Calcium Carbonate-Vitamin D (CALTRATE 600+D) 600-400 MG-UNIT per tablet Take 1 tablet by mouth 2 (two) times daily.    [name] denosumab (PROLIA) 60 MG/ML SOLN injection Inject 60 mg into the skin every 6 (six) months. Administer in upper arm,  thigh, or abdomen    . doxycycline (VIBRA-TABS) 100 MG tablet Take 1 tablet (100 mg total) by mouth 2 (two) times daily. 20 tablet 0  . ferrous fumarate (HEMOCYTE - 106 MG FE) 325 (106 FE) MG TABS tablet Take 1 tablet by mouth.    . gabapentin (NEURONTIN) 100 MG capsule take 1 capsule by mouth daily as needed    . KRILL OIL OMEGA-3 PO Take 1 capsule by mouth daily.    [name] levothyroxine (SYNTHROID, LEVOTHROID) 112 MCG tablet Take 1 tablet (112 mcg total) by mouth daily. 30 tablet 1  . omeprazole (PRILOSEC) 40 MG capsule take 1 capsule by mouth once daily 30 capsule 2  . vitamin B-12 (CYANOCOBALAMIN) 100 MCG tablet Take 100 mcg by mouth daily.     No current facility-administered medications on file prior to visit.    BP 120/80 mmHg  Pulse 76  Temp(Src) 98.1 F (36.7 C) (Oral)  Resp 16  Ht 5\' 4"  (1.626 m)  Wt 145 lb (65.772 kg)  BMI 24.88 kg/m2  SpO2 99%       Objective:   Physical Exam  Constitutional: She is oriented to person, place, and time. She appears well-developed and well-nourished. No distress.  HENT:  Head: Atraumatic.  Right Ear: Tympanic membrane and ear canal normal.  Left Ear: Tympanic membrane and ear canal normal.  Cardiovascular: Normal rate and regular rhythm.   No murmur heard. Pulmonary/Chest: Effort normal and breath sounds normal. No respiratory distress. She has no wheezes. She has no rales. She exhibits no tenderness.  Musculoskeletal: She exhibits no edema.  Lymphadenopathy:    She has no cervical adenopathy.  Neurological: She is alert and oriented to person, place, and time.  Skin: Skin is warm and dry.  Psychiatric: She has a normal mood and affect. Her behavior is normal. Judgment and thought content normal.          Assessment & Plan:

## 2014-07-24 NOTE — Progress Notes (Signed)
Pre visit review using our clinic review tool, if applicable. No additional management support is needed unless otherwise documented below in the visit note. 

## 2014-08-02 ENCOUNTER — Ambulatory Visit (HOSPITAL_BASED_OUTPATIENT_CLINIC_OR_DEPARTMENT_OTHER): Payer: Medicare Other

## 2014-08-02 ENCOUNTER — Other Ambulatory Visit (HOSPITAL_BASED_OUTPATIENT_CLINIC_OR_DEPARTMENT_OTHER): Payer: Medicare Other

## 2014-08-02 ENCOUNTER — Encounter: Payer: Self-pay | Admitting: Internal Medicine

## 2014-08-02 ENCOUNTER — Telehealth: Payer: Self-pay | Admitting: Internal Medicine

## 2014-08-02 ENCOUNTER — Ambulatory Visit (HOSPITAL_BASED_OUTPATIENT_CLINIC_OR_DEPARTMENT_OTHER): Payer: Medicare Other | Admitting: Internal Medicine

## 2014-08-02 VITALS — BP 136/85 | HR 89 | Temp 97.9°F | Resp 18 | Ht 64.0 in | Wt 144.9 lb

## 2014-08-02 DIAGNOSIS — C8280 Other types of follicular lymphoma, unspecified site: Secondary | ICD-10-CM

## 2014-08-02 DIAGNOSIS — Z5112 Encounter for antineoplastic immunotherapy: Secondary | ICD-10-CM

## 2014-08-02 DIAGNOSIS — C8590 Non-Hodgkin lymphoma, unspecified, unspecified site: Secondary | ICD-10-CM

## 2014-08-02 LAB — CBC WITH DIFFERENTIAL/PLATELET
BASO%: 1.5 % (ref 0.0–2.0)
BASOS ABS: 0.1 10*3/uL (ref 0.0–0.1)
EOS%: 5.8 % (ref 0.0–7.0)
Eosinophils Absolute: 0.4 10*3/uL (ref 0.0–0.5)
HCT: 43.1 % (ref 34.8–46.6)
HGB: 14.6 g/dL (ref 11.6–15.9)
LYMPH%: 25.3 % (ref 14.0–49.7)
MCH: 32.9 pg (ref 25.1–34.0)
MCHC: 33.9 g/dL (ref 31.5–36.0)
MCV: 97.1 fL (ref 79.5–101.0)
MONO#: 0.5 10*3/uL (ref 0.1–0.9)
MONO%: 7.8 % (ref 0.0–14.0)
NEUT#: 3.6 10*3/uL (ref 1.5–6.5)
NEUT%: 59.6 % (ref 38.4–76.8)
Platelets: 273 10*3/uL (ref 145–400)
RBC: 4.44 10*6/uL (ref 3.70–5.45)
RDW: 13.9 % (ref 11.2–14.5)
WBC: 6 10*3/uL (ref 3.9–10.3)
lymph#: 1.5 10*3/uL (ref 0.9–3.3)

## 2014-08-02 LAB — COMPREHENSIVE METABOLIC PANEL (CC13)
ALK PHOS: 79 U/L (ref 40–150)
ALT: 20 U/L (ref 0–55)
AST: 23 U/L (ref 5–34)
Albumin: 4.1 g/dL (ref 3.5–5.0)
Anion Gap: 10 mEq/L (ref 3–11)
BILIRUBIN TOTAL: 0.55 mg/dL (ref 0.20–1.20)
BUN: 15 mg/dL (ref 7.0–26.0)
CHLORIDE: 106 meq/L (ref 98–109)
CO2: 26 meq/L (ref 22–29)
Calcium: 9.7 mg/dL (ref 8.4–10.4)
Creatinine: 0.8 mg/dL (ref 0.6–1.1)
EGFR: 75 mL/min/{1.73_m2} — ABNORMAL LOW (ref >90)
Glucose: 84 mg/dl (ref 70–140)
Potassium: 4.1 mEq/L (ref 3.5–5.1)
Sodium: 142 mEq/L (ref 136–145)
Total Protein: 6.7 g/dL (ref 6.4–8.3)

## 2014-08-02 LAB — LACTATE DEHYDROGENASE (CC13): LDH: 207 U/L (ref 125–245)

## 2014-08-02 MED ORDER — ACETAMINOPHEN 325 MG PO TABS
650.0000 mg | ORAL_TABLET | Freq: Once | ORAL | Status: AC
  Administered 2014-08-02: 650 mg via ORAL

## 2014-08-02 MED ORDER — SODIUM CHLORIDE 0.9 % IV SOLN
375.0000 mg/m2 | Freq: Once | INTRAVENOUS | Status: AC
  Administered 2014-08-02: 600 mg via INTRAVENOUS
  Filled 2014-08-02: qty 60

## 2014-08-02 MED ORDER — ACETAMINOPHEN 325 MG PO TABS
ORAL_TABLET | ORAL | Status: AC
  Filled 2014-08-02: qty 2

## 2014-08-02 MED ORDER — DIPHENHYDRAMINE HCL 25 MG PO CAPS
50.0000 mg | ORAL_CAPSULE | Freq: Once | ORAL | Status: AC
  Administered 2014-08-02: 50 mg via ORAL

## 2014-08-02 MED ORDER — SODIUM CHLORIDE 0.9 % IV SOLN
Freq: Once | INTRAVENOUS | Status: AC
  Administered 2014-08-02: 11:00:00 via INTRAVENOUS

## 2014-08-02 MED ORDER — BENZONATATE 100 MG PO CAPS: 100.0000 mg | ORAL_CAPSULE | Freq: Three times a day (TID) | ORAL | Status: DC | PRN

## 2014-08-02 MED ORDER — DIPHENHYDRAMINE HCL 25 MG PO CAPS
ORAL_CAPSULE | ORAL | Status: AC
  Filled 2014-08-02: qty 2

## 2014-08-02 NOTE — Progress Notes (Signed)
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 in [place].  2) Status post surgical excision of the right neck mass under the care of Dr. Johney Maine on 10/28/2012.   CURRENT THERAPY: Maintenance Rituxan 375 mg/M2 every 2 months status post 10 cycles of his dose was given on 12/14/2012.  ADVANCED DIRECTIVE: Does not have advanced directive.   INTERVAL HISTORY: [name] 80 y.o. female returns to the clinic today for 51-month followup visit. The patient is tolerating her current maintenance treatment with Rituxan fairly well. She just recovered from recent bronchitis after treatment with doxycycline. She has no palpable lymphadenopathy. She denied having any significant nausea or vomiting. She has no fever or chills. The patient denied having any significant weight loss or night sweats. She has no chest pain, shortness of breath, cough or hemoptysis. She is here today to start cycle #10 of her treatment.  MEDICAL HISTORY: Past Medical History  Diagnosis Date  . Hypothyroidism   . Anemia     pt denies  . Osteoporosis   . Pleural effusion, bilateral     history of  . Pulmonary nodules     history of  . History of pancreatitis     pt denies  . Cystocele     history with rectocele, pt denies  . Scoliosis   . Varicose vein   . Non Hodgkin's lymphoma   . Acute bronchitis 10/09/2012  . Pacemaker 09/15/2011    Medtronic Revo  . Dysrhythmia   . Shortness of breath     with exertion  . GERD (gastroesophageal reflux  disease)   . H/O hiatal hernia   . Arthritis   . Cataract     bil  . Hemorrhoid   . Pericarditis   . Atrial fibrillation     ALLERGIES:  is allergic to amoxicillin; codeine; demerol; morphine and related; vicodin; and sulfonamide derivatives.  MEDICATIONS:  Current Outpatient Prescriptions  Medication Sig Dispense Refill  . acetaminophen (TYLENOL) 500 MG tablet Take 500 mg by mouth every 6 (six) hours as needed for pain.      [name] ALPRAZolam (XANAX) 0.5 MG tablet take 1 tablet by mouth once daily if needed for sleep  30 tablet  0  . amLODipine (NORVASC) 10 MG tablet take 1 tablet by mouth once daily  DUE FOR A FOLLOW-UP OFFICE VISIT  90 tablet  1  . aspirin 81 MG tablet Take 81 mg by mouth daily.      . Calcium Carbonate-Vitamin D (CALTRATE 600+D) 600-400 MG-UNIT per tablet Take 1 tablet by mouth 2 (two) times daily.      [name] denosumab (PROLIA) 60 MG/ML SOLN injection Inject 60 mg into the skin every 6 (six) months. Administer in upper arm, thigh, or abdomen      . ferrous fumarate (HEMOCYTE - 106 MG FE) 325 (106 FE) MG TABS tablet Take 1 tablet by mouth.      . gabapentin (NEURONTIN) 100 MG capsule take 1 capsule by mouth three times a day  90 capsule  2  .  KRILL OIL OMEGA-3 PO Take 1 capsule by mouth daily.      [name] levothyroxine (SYNTHROID, LEVOTHROID) 100 MCG tablet take 1 tablet by mouth once daily  90 tablet  1  . omeprazole (PRILOSEC) 40 MG capsule Take 1 capsule (40 mg total) by mouth daily.  180 capsule  1  . vitamin B-12 (CYANOCOBALAMIN) 100 MCG tablet Take 100 mcg by mouth daily.       No current facility-administered medications for this visit.    SURGICAL HISTORY:  Past Surgical History  Procedure Laterality Date  . Pacemaker insertion  09/15/11    MDT  . Tonsillectomy    . Breast surgery  07/30/01    biopsy x 3, bilateral  . Abdominal hysterectomy  04/29/04    partial  . Mass excision Right 10/28/2012    Procedure: Removal of mass on neck       ;  Surgeon: Adin Hector,  MD;  Location: [facility];  Service: General;  Laterality: Right;  . Colonoscopy    . Eye surgery      cataracts  . Permanent pacemaker insertion N/A 09/15/2011    Procedure: PERMANENT PACEMAKER INSERTION;  Surgeon: Sanda Klein, MD;  Location: [facility];  Service: Cardiovascular;  Laterality: N/A;    REVIEW OF SYSTEMS:  A comprehensive review of systems was negative.   PHYSICAL EXAMINATION: General appearance: alert, cooperative and no distress Head: Normocephalic, without obvious abnormality, atraumatic Neck: no adenopathy, no JVD, supple, symmetrical, trachea midline and thyroid not enlarged, symmetric, no tenderness/mass/nodules Lymph nodes: Cervical, supraclavicular, and axillary nodes normal. Resp: clear to auscultation bilaterally Back: symmetric, no curvature. ROM normal. No CVA tenderness. Cardio: regular rate and rhythm, S1, S2 normal, no murmur, click, rub or gallop GI: soft, non-tender; bowel sounds normal; no masses,  no organomegaly Extremities: extremities normal, atraumatic, no cyanosis or edema Neurologic: Alert and oriented X 3, normal strength and tone. Normal symmetric reflexes. Normal coordination and gait  ECOG PERFORMANCE STATUS: 0 - Asymptomatic  Blood pressure 136/85, pulse 89, temperature 97.9 F (36.6 C), temperature source Oral, resp. rate 18, height 5\' 4"  (1.626 m), weight 144 lb 14.4 oz (65.726 kg), SpO2 99 %.  LABORATORY DATA: Lab Results  Component Value Date   WBC 6.0 08/02/2014   HGB 14.6 08/02/2014   HCT 43.1 08/02/2014   MCV 97.1 08/02/2014   PLT 273 08/02/2014      Chemistry      Component Value Date/Time   NA 142 08/02/2014 0919   NA 140 08/07/2013 1149   K 4.1 08/02/2014 0919   K 4.6 08/07/2013 1149   CL 103 08/07/2013 1149   CL 109* 12/14/2012 1009   CO2 26 08/02/2014 0919   CO2 28 08/07/2013 1149   BUN 15.0 08/02/2014 0919   BUN 15 08/07/2013 1149   CREATININE 0.8 08/02/2014 0919   CREATININE 0.69 08/07/2013 1149   CREATININE  0.8 12/06/2012 1649      Component Value Date/Time   CALCIUM 9.7 08/02/2014 0919   CALCIUM 9.8 08/07/2013 1149   ALKPHOS 79 08/02/2014 0919   ALKPHOS 77 10/06/2012 1202   AST 23 08/02/2014 0919   AST 28 10/06/2012 1202   ALT 20 08/02/2014 0919   ALT 19 10/06/2012 1202   BILITOT 0.55 08/02/2014 0919   BILITOT 0.3 10/06/2012 1202       RADIOGRAPHIC STUDIES:  ASSESSMENT AND PLAN: This is a very pleasant 80 years old white female with recurrent non-Hodgkin's lymphoma, follicular center cell type  currently on maintenance treatment with single agent Rituxan status post 10 cycles. The patient is tolerating her treatment fairly well with no significant adverse effects. We will proceed with cycle #11 today as a scheduled. She would come back for followup visit in 2 months with the next cycle of her treatment. She was given a refill of Tessalon Per[place]. She was advised to call immediately if she has any concerning symptoms in the interval. The patient voices understanding of current disease status and treatment options and is in agreement with the current care plan. All questions were answered. The patient knows to call the clinic with any problems, questions or concerns. We can certainly see the patient much sooner if necessary.  Disclaimer: This note was dictated with voice recognition software. Similar sounding words can inadvertently be transcribed and may not be corrected upon review.

## 2014-08-02 NOTE — Telephone Encounter (Signed)
gv and printed appt sched and avs forpt for April....sed added tx. °

## 2014-08-02 NOTE — Patient Instructions (Signed)
[facility] Discharge Instructions for Patients Receiving Chemotherapy  Today you received the following chemotherapy agents Rituxan.  To help prevent nausea and vomiting after your treatment, we encourage you to take your nausea medication as prescribed.   If you develop nausea and vomiting that is not controlled by your nausea medication, call the clinic.   BELOW ARE SYMPTOMS THAT SHOULD BE REPORTED IMMEDIATELY:  *FEVER GREATER THAN 100.5 F  *CHILLS WITH OR WITHOUT FEVER  NAUSEA AND VOMITING THAT IS NOT CONTROLLED WITH YOUR NAUSEA MEDICATION  *UNUSUAL SHORTNESS OF BREATH  *UNUSUAL BRUISING OR BLEEDING  TENDERNESS IN MOUTH AND THROAT WITH OR WITHOUT PRESENCE OF ULCERS  *URINARY PROBLEMS  *BOWEL PROBLEMS  UNUSUAL RASH Items with * indicate a potential emergency and should be followed up as soon as possible.  Feel free to call the clinic you have any questions or concerns. The clinic phone number is [phone].    

## 2014-08-03 ENCOUNTER — Other Ambulatory Visit: Payer: Self-pay | Admitting: Family

## 2014-08-03 NOTE — Telephone Encounter (Signed)
I do not see that we have prescribed gabapentin for pt before.  Is it ok to send refill?

## 2014-08-04 NOTE — Telephone Encounter (Signed)
Need to know who was prescribing and why she is taking first.  Also need

## 2014-08-06 NOTE — Telephone Encounter (Signed)
Refill sent.

## 2014-08-06 NOTE — Telephone Encounter (Signed)
[name]-- my error. Looks like we have prescribed this for pt in the past. She thinks she has been taking it for back pain.  Refill sent to pharmacy.

## 2014-08-06 NOTE — Telephone Encounter (Signed)
Ok to send one month supply with 2 refills.

## 2014-08-28 ENCOUNTER — Other Ambulatory Visit: Payer: Self-pay | Admitting: Obstetrics and Gynecology

## 2014-08-28 DIAGNOSIS — R87610 Atypical squamous cells of undetermined significance on cytologic smear of cervix (ASC-US): Secondary | ICD-10-CM | POA: Diagnosis not present

## 2014-08-28 DIAGNOSIS — Z779 Other contact with and (suspected) exposures hazardous to health: Secondary | ICD-10-CM | POA: Diagnosis not present

## 2014-08-29 LAB — CYTOLOGY - PAP

## 2014-09-03 ENCOUNTER — Other Ambulatory Visit: Payer: Medicare Other

## 2014-09-05 ENCOUNTER — Ambulatory Visit (INDEPENDENT_AMBULATORY_CARE_PROVIDER_SITE_OTHER): Payer: Medicare Other | Admitting: *Deleted

## 2014-09-05 DIAGNOSIS — I495 Sick sinus syndrome: Secondary | ICD-10-CM | POA: Diagnosis not present

## 2014-09-05 NOTE — Progress Notes (Signed)
Remote pacemaker transmission.   

## 2014-09-07 LAB — MDC_IDC_ENUM_SESS_TYPE_REMOTE
Battery Voltage: 3 V
Brady Statistic RA Percent Paced: 90.27 %
Lead Channel Impedance Value: 512 Ohm
Lead Channel Sensing Intrinsic Amplitude: 1.9 mV
Lead Channel Sensing Intrinsic Amplitude: 8.9 mV
Lead Channel Setting Pacing Amplitude: 2 V
Lead Channel Setting Sensing Sensitivity: 0.9 mV
MDC IDC MSMT LEADCHNL RA IMPEDANCE VALUE: 416 Ohm
MDC IDC SESS DTM: 20160309151057
MDC IDC SET ZONE DETECTION INTERVAL: 390 ms
Zone Setting Detection Interval: 400 ms

## 2014-09-13 ENCOUNTER — Encounter: Payer: Self-pay | Admitting: Cardiology

## 2014-09-19 ENCOUNTER — Other Ambulatory Visit: Payer: Self-pay | Admitting: Family

## 2014-09-19 ENCOUNTER — Encounter: Payer: Self-pay | Admitting: Internal Medicine

## 2014-09-19 NOTE — Telephone Encounter (Signed)
Pt due for f/u 10/17/14.  Please advise refill?   Medication name:  Name from pharmacy:  ALPRAZolam (XANAX) 0.5 MG tablet ALPRAZOLAM 0.5 MG TABLET    Sig: take 1 tablet by mouth at bedtime if needed FOR SLEEP    Dispense: 30 tablet (Pharmacy requested 30)   Refills: 0   Start: 09/19/2014   Class: Normal    Requested on: 07/03/2014    Originally ordered on: 08/07/2013 07/04/2014

## 2014-09-19 NOTE — Telephone Encounter (Signed)
OK to send 30 tabs zero refills.  

## 2014-09-20 NOTE — Telephone Encounter (Signed)
Rx called to pharmacy voicemail. 

## 2014-09-28 ENCOUNTER — Encounter (HOSPITAL_COMMUNITY): Payer: Self-pay

## 2014-10-01 ENCOUNTER — Other Ambulatory Visit: Payer: Self-pay | Admitting: Family

## 2014-10-01 DIAGNOSIS — E039 Hypothyroidism, unspecified: Secondary | ICD-10-CM

## 2014-10-01 NOTE — Telephone Encounter (Signed)
Last TSH 07/18/14 and pt advised recheck in 6 weeks. 30 day supply sent to pharmacy. Needs TSH level drawn.  Future lab order entered. Please call pt to arrange lab appt in the next couple of weeks.

## 2014-10-02 ENCOUNTER — Other Ambulatory Visit: Payer: Self-pay | Admitting: Family

## 2014-10-02 NOTE — Telephone Encounter (Signed)
Informed patient of med refill and she scheduled lab appointment for 10/12/14

## 2014-10-04 ENCOUNTER — Ambulatory Visit (HOSPITAL_BASED_OUTPATIENT_CLINIC_OR_DEPARTMENT_OTHER): Payer: Medicare Other | Admitting: Physician Assistant

## 2014-10-04 ENCOUNTER — Ambulatory Visit: Payer: Medicare Other | Admitting: Internal Medicine

## 2014-10-04 ENCOUNTER — Telehealth: Payer: Self-pay | Admitting: Internal Medicine

## 2014-10-04 ENCOUNTER — Other Ambulatory Visit (HOSPITAL_BASED_OUTPATIENT_CLINIC_OR_DEPARTMENT_OTHER): Payer: Medicare Other

## 2014-10-04 ENCOUNTER — Ambulatory Visit (HOSPITAL_BASED_OUTPATIENT_CLINIC_OR_DEPARTMENT_OTHER): Payer: Medicare Other

## 2014-10-04 ENCOUNTER — Other Ambulatory Visit: Payer: Self-pay | Admitting: Internal Medicine

## 2014-10-04 ENCOUNTER — Encounter: Payer: Self-pay | Admitting: Medical Oncology

## 2014-10-04 ENCOUNTER — Encounter: Payer: Self-pay | Admitting: Physician Assistant

## 2014-10-04 ENCOUNTER — Other Ambulatory Visit: Payer: Self-pay | Admitting: Medical Oncology

## 2014-10-04 VITALS — BP 135/64 | HR 91 | Temp 97.6°F | Resp 18 | Ht 64.0 in | Wt 142.6 lb

## 2014-10-04 DIAGNOSIS — C8220 Follicular lymphoma grade III, unspecified, unspecified site: Secondary | ICD-10-CM

## 2014-10-04 DIAGNOSIS — C8590 Non-Hodgkin lymphoma, unspecified, unspecified site: Secondary | ICD-10-CM

## 2014-10-04 DIAGNOSIS — IMO0001 Reserved for inherently not codable concepts without codable children: Secondary | ICD-10-CM

## 2014-10-04 DIAGNOSIS — Z5112 Encounter for antineoplastic immunotherapy: Secondary | ICD-10-CM | POA: Diagnosis not present

## 2014-10-04 DIAGNOSIS — D469 Myelodysplastic syndrome, unspecified: Secondary | ICD-10-CM

## 2014-10-04 LAB — COMPREHENSIVE METABOLIC PANEL (CC13)
ALT: 17 U/L (ref 0–55)
AST: 23 U/L (ref 5–34)
Albumin: 4.2 g/dL (ref 3.5–5.0)
Alkaline Phosphatase: 83 U/L (ref 40–150)
Anion Gap: 11 mEq/L (ref 3–11)
BILIRUBIN TOTAL: 0.56 mg/dL (ref 0.20–1.20)
BUN: 13.3 mg/dL (ref 7.0–26.0)
CHLORIDE: 109 meq/L (ref 98–109)
CO2: 24 mEq/L (ref 22–29)
Calcium: 9.7 mg/dL (ref 8.4–10.4)
Creatinine: 0.7 mg/dL (ref 0.6–1.1)
EGFR: 77 mL/min/{1.73_m2} — ABNORMAL LOW (ref >90)
GLUCOSE: 82 mg/dL (ref 70–140)
POTASSIUM: 3.8 meq/L (ref 3.5–5.1)
SODIUM: 144 meq/L (ref 136–145)
TOTAL PROTEIN: 6.8 g/dL (ref 6.4–8.3)

## 2014-10-04 LAB — CBC WITH DIFFERENTIAL/PLATELET
BASO%: 1.5 % (ref 0.0–2.0)
BASOS ABS: 0.1 10*3/uL (ref 0.0–0.1)
EOS%: 7.2 % — ABNORMAL HIGH (ref 0.0–7.0)
Eosinophils Absolute: 0.5 10*3/uL (ref 0.0–0.5)
HCT: 43 % (ref 34.8–46.6)
HEMOGLOBIN: 14.4 g/dL (ref 11.6–15.9)
LYMPH%: 30 % (ref 14.0–49.7)
MCH: 31.9 pg (ref 25.1–34.0)
MCHC: 33.6 g/dL (ref 31.5–36.0)
MCV: 95 fL (ref 79.5–101.0)
MONO#: 0.6 10*3/uL (ref 0.1–0.9)
MONO%: 9.4 % (ref 0.0–14.0)
NEUT%: 51.9 % (ref 38.4–76.8)
NEUTROS ABS: 3.5 10*3/uL (ref 1.5–6.5)
PLATELETS: 279 10*3/uL (ref 145–400)
RBC: 4.53 10*6/uL (ref 3.70–5.45)
RDW: 13.5 % (ref 11.2–14.5)
WBC: 6.7 10*3/uL (ref 3.9–10.3)
lymph#: 2 10*3/uL (ref 0.9–3.3)

## 2014-10-04 LAB — LACTATE DEHYDROGENASE (CC13): LDH: 217 U/L (ref 125–245)

## 2014-10-04 MED ORDER — ACETAMINOPHEN 325 MG PO TABS
650.0000 mg | ORAL_TABLET | Freq: Once | ORAL | Status: AC
  Administered 2014-10-04: 650 mg via ORAL

## 2014-10-04 MED ORDER — DIPHENHYDRAMINE HCL 25 MG PO CAPS
ORAL_CAPSULE | ORAL | Status: AC
  Filled 2014-10-04: qty 2

## 2014-10-04 MED ORDER — ACETAMINOPHEN 325 MG PO TABS
ORAL_TABLET | ORAL | Status: AC
  Filled 2014-10-04: qty 2

## 2014-10-04 MED ORDER — SODIUM CHLORIDE 0.9 % IV SOLN
Freq: Once | INTRAVENOUS | Status: AC
  Administered 2014-10-04: 11:00:00 via INTRAVENOUS

## 2014-10-04 MED ORDER — SODIUM CHLORIDE 0.9 % IV SOLN
375.0000 mg/m2 | Freq: Once | INTRAVENOUS | Status: AC
  Administered 2014-10-04: 600 mg via INTRAVENOUS
  Filled 2014-10-04: qty 60

## 2014-10-04 MED ORDER — DIPHENHYDRAMINE HCL 25 MG PO CAPS
50.0000 mg | ORAL_CAPSULE | Freq: Once | ORAL | Status: AC
  Administered 2014-10-04: 50 mg via ORAL

## 2014-10-04 NOTE — Progress Notes (Signed)
[place] [phone]   [phone]  OFFICE PROGRESS NOTE  [name], NP [place] [address]  DIAGNOSIS: recurrent Non Hodgkin's lymphoma, follicular center cell type with a predominant follicular pattern.Grade (if applicable): Favor high grade (grade 3/3), initially diagnosed in November of 2012   PRIOR THERAPY:  1) Status post treatment with Rituxan weekly for 3 doses in addition to 3 tablets of Afinitor at [facility] in [place].  2) Status post surgical excision of the right neck mass under the care of Dr. Johney Maine on 10/28/2012.   CURRENT THERAPY: Maintenance Rituxan 375 mg/M2 every 2 months status post 11 cycles. First dose was given on 12/14/2012.  ADVANCED DIRECTIVE: Does not have advanced directive.   INTERVAL HISTORY: [name] 80 y.o. female returns to the clinic today for 19-month followup visit. She is accompanied by her daughter.The patient is tolerating her current maintenance treatment with Rituxan fairly well. She reports some night sweats but states they are stable. She denied any palpable lymphadenopathy She denied having any significant nausea or vomiting. She has no fever or chills. The patient denied having any significant weight loss. She has no chest pain, shortness of breath, cough or hemoptysis. She is here today to start cycle #12 of her treatment. She has previously had some abnormalities in her IgM and IgG. Her daughter has CVIG and is currently being treated with Hizentra ( immune globulin). They are concerned [name] may be "converting".  MEDICAL HISTORY: Past Medical History  Diagnosis Date  . Hypothyroidism   . Anemia     pt denies  . Osteoporosis   . Pleural effusion, bilateral     history of  . Pulmonary nodules     history of  . History of pancreatitis     pt denies  . Cystocele     history with rectocele, pt denies  . Scoliosis   . Varicose  vein   . Non Hodgkin's lymphoma   . Acute bronchitis 10/09/2012  . Pacemaker 09/15/2011    Medtronic Revo  . Dysrhythmia   . Shortness of breath     with exertion  . GERD (gastroesophageal reflux disease)   . H/O hiatal hernia   . Arthritis   . Cataract     bil  . Hemorrhoid   . Pericarditis   . Atrial fibrillation     ALLERGIES:  is allergic to amoxicillin; codeine; demerol; morphine and related; vicodin; and sulfonamide derivatives.  MEDICATIONS:  Current Outpatient Prescriptions  Medication Sig Dispense Refill  . acetaminophen (TYLENOL) 500 MG tablet Take 500 mg by mouth every 6 (six) hours as needed for pain.      [name] ALPRAZolam (XANAX) 0.5 MG tablet take 1 tablet by mouth once daily if needed for sleep  30 tablet  0  . amLODipine (NORVASC) 10 MG tablet take 1 tablet by mouth once daily  DUE FOR A FOLLOW-UP OFFICE VISIT  90 tablet  1  . aspirin 81 MG tablet Take 81 mg by mouth daily.      . Calcium Carbonate-Vitamin D (CALTRATE 600+D) 600-400 MG-UNIT per tablet Take 1 tablet by mouth 2 (two) times daily.      [name] denosumab (PROLIA) 60 MG/ML SOLN injection Inject 60 mg into the skin every 6 (six) months. Administer in upper arm, thigh, or abdomen      . ferrous fumarate (HEMOCYTE - 106 MG FE) 325 (106 FE)  MG TABS tablet Take 1 tablet by mouth.      . gabapentin (NEURONTIN) 100 MG capsule take 1 capsule by mouth three times a day  90 capsule  2  . KRILL OIL OMEGA-3 PO Take 1 capsule by mouth daily.      [name] levothyroxine (SYNTHROID, LEVOTHROID) 100 MCG tablet take 1 tablet by mouth once daily  90 tablet  1  . omeprazole (PRILOSEC) 40 MG capsule Take 1 capsule (40 mg total) by mouth daily.  180 capsule  1  . vitamin B-12 (CYANOCOBALAMIN) 100 MCG tablet Take 100 mcg by mouth daily.       No current facility-administered medications for this visit.    SURGICAL HISTORY:  Past Surgical History  Procedure Laterality Date  . Pacemaker insertion  09/15/11    MDT  . Tonsillectomy    .  Breast surgery  07/30/01    biopsy x 3, bilateral  . Abdominal hysterectomy  04/29/04    partial  . Mass excision Right 10/28/2012    Procedure: Removal of mass on neck       ;  Surgeon: Adin Hector, MD;  Location: [facility];  Service: General;  Laterality: Right;  . Colonoscopy    . Eye surgery      cataracts  . Permanent pacemaker insertion N/A 09/15/2011    Procedure: PERMANENT PACEMAKER INSERTION;  Surgeon: Sanda Klein, MD;  Location: [facility];  Service: Cardiovascular;  Laterality: N/A;    REVIEW OF SYSTEMS:  A comprehensive review of systems was negative except for: Constitutional: positive for night sweats and stable and at baseline   PHYSICAL EXAMINATION: General appearance: alert, cooperative and no distress Head: Normocephalic, without obvious abnormality, atraumatic Neck: no adenopathy, no JVD, supple, symmetrical, trachea midline and thyroid not enlarged, symmetric, no tenderness/mass/nodules Lymph nodes: Cervical, supraclavicular, and axillary nodes normal. Resp: clear to auscultation bilaterally Back: symmetric, no curvature. ROM normal. No CVA tenderness. Cardio: regular rate and rhythm, S1, S2 normal, no murmur, click, rub or gallop GI: soft, non-tender; bowel sounds normal; no masses,  no organomegaly Extremities: extremities normal, atraumatic, no cyanosis or edema Neurologic: Alert and oriented X 3, normal strength and tone. Normal symmetric reflexes. Normal coordination and gait  ECOG PERFORMANCE STATUS: 0 - Asymptomatic  Blood pressure 135/64, pulse 91, temperature 97.6 F (36.4 C), temperature source Oral, resp. rate 18, height 5\' 4"  (1.626 m), weight 142 lb 9.6 oz (64.683 kg).  LABORATORY DATA: Lab Results  Component Value Date   WBC 6.7 10/04/2014   HGB 14.4 10/04/2014   HCT 43.0 10/04/2014   MCV 95.0 10/04/2014   PLT 279 10/04/2014      Chemistry      Component Value Date/Time   NA 144 10/04/2014 0920   NA 140 08/07/2013 1149   K 3.8 10/04/2014  0920   K 4.6 08/07/2013 1149   CL 103 08/07/2013 1149   CL 109* 12/14/2012 1009   CO2 24 10/04/2014 0920   CO2 28 08/07/2013 1149   BUN 13.3 10/04/2014 0920   BUN 15 08/07/2013 1149   CREATININE 0.7 10/04/2014 0920   CREATININE 0.69 08/07/2013 1149   CREATININE 0.8 12/06/2012 1649      Component Value Date/Time   CALCIUM 9.7 10/04/2014 0920   CALCIUM 9.8 08/07/2013 1149   ALKPHOS 83 10/04/2014 0920   ALKPHOS 77 10/06/2012 1202   AST 23 10/04/2014 0920   AST 28 10/06/2012 1202   ALT 17 10/04/2014 0920   ALT 19  10/06/2012 1202   BILITOT 0.56 10/04/2014 0920   BILITOT 0.3 10/06/2012 1202       RADIOGRAPHIC STUDIES:  ASSESSMENT AND PLAN: This is a very pleasant 80 years old white female with recurrent non-Hodgkin's lymphoma, follicular center cell type currently on maintenance treatment with single agent Rituxan status post 11 cycles. The patient is tolerating her treatment fairly well with no significant adverse effects. We will proceed with cycle #12 today as a scheduled. She will follow up pin 2 months with a restaging PET scan. I will also order a repeat CBC diff, CMET, LDH and a SPEP & IFE with QIG to re-evaluate her protein abnormalities. . She was advised to call immediately if she has any concerning symptoms in the interval. The patient voices understanding of current disease status and treatment options and is in agreement with the current care plan. All questions were answered. The patient knows to call the clinic with any problems, questions or concerns. We can certainly see the patient much sooner if necessary.  Carlton Adam, PA-C 10/04/2014   Disclaimer: This note was dictated with voice recognition software. Similar sounding words can inadvertently be transcribed and may not be corrected upon review.

## 2014-10-04 NOTE — Patient Instructions (Signed)
[facility] Discharge Instructions for Patients Receiving Chemotherapy  Today you received the following chemotherapy agents: Rituxan   To help prevent nausea and vomiting after your treatment, we encourage you to take your nausea medication as directed.    If you develop nausea and vomiting that is not controlled by your nausea medication, call the clinic.   BELOW ARE SYMPTOMS THAT SHOULD BE REPORTED IMMEDIATELY:  *FEVER GREATER THAN 100.5 F  *CHILLS WITH OR WITHOUT FEVER  NAUSEA AND VOMITING THAT IS NOT CONTROLLED WITH YOUR NAUSEA MEDICATION  *UNUSUAL SHORTNESS OF BREATH  *UNUSUAL BRUISING OR BLEEDING  TENDERNESS IN MOUTH AND THROAT WITH OR WITHOUT PRESENCE OF ULCERS  *URINARY PROBLEMS  *BOWEL PROBLEMS  UNUSUAL RASH Items with * indicate a potential emergency and should be followed up as soon as possible.  Feel free to call the clinic you have any questions or concerns. The clinic phone number is [phone].  Please show the CHEMO ALERT CARD at check-in to the Emergency Department and triage nurse.   

## 2014-10-04 NOTE — Telephone Encounter (Signed)
gave and printed appt sched and avs fo rpt for Apirl and June

## 2014-10-08 NOTE — Patient Instructions (Signed)
Follow up in 2 months with a restaging PET scan and lab studies including protein studies to re-evaluate your disease.

## 2014-10-12 ENCOUNTER — Telehealth: Payer: Self-pay | Admitting: Family

## 2014-10-12 ENCOUNTER — Other Ambulatory Visit (INDEPENDENT_AMBULATORY_CARE_PROVIDER_SITE_OTHER): Payer: Medicare Other

## 2014-10-12 DIAGNOSIS — E039 Hypothyroidism, unspecified: Secondary | ICD-10-CM

## 2014-10-12 LAB — TSH: TSH: 0.37 u[IU]/mL (ref 0.35–4.50)

## 2014-10-12 NOTE — Telephone Encounter (Signed)
Please let pt know TSH is looking better but now dose is borderline too high. Lets repeat tsh in 1 month. If necessary I may need to lower it at that time.

## 2014-10-15 NOTE — Telephone Encounter (Signed)
Notified pt and she voices understanding. Lab order entered and lab appt made for 11/14/14 at 1:30.

## 2014-11-02 ENCOUNTER — Other Ambulatory Visit: Payer: Self-pay | Admitting: Family

## 2014-11-02 DIAGNOSIS — R1032 Left lower quadrant pain: Secondary | ICD-10-CM

## 2014-11-02 NOTE — Telephone Encounter (Signed)
30 day supply sent for amlodipine and gabapentin. Pt is past due for follow up. She will need to be seen back in the office for further refills. Please call pt to arrange appointment.

## 2014-11-05 NOTE — Telephone Encounter (Signed)
I ordered a CT abd pelvis to evaluate LLQ pain at her last visit.  This was not completed.  Is she still having pain- if so, she should complete.

## 2014-11-06 ENCOUNTER — Other Ambulatory Visit (INDEPENDENT_AMBULATORY_CARE_PROVIDER_SITE_OTHER): Payer: Medicare Other

## 2014-11-06 DIAGNOSIS — R1032 Left lower quadrant pain: Secondary | ICD-10-CM | POA: Diagnosis not present

## 2014-11-06 DIAGNOSIS — E039 Hypothyroidism, unspecified: Secondary | ICD-10-CM | POA: Diagnosis not present

## 2014-11-06 LAB — BASIC METABOLIC PANEL
BUN: 12 mg/dL (ref 6–23)
CO2: 29 mEq/L (ref 19–32)
Calcium: 9.9 mg/dL (ref 8.4–10.5)
Chloride: 105 mEq/L (ref 96–112)
Creatinine, Ser: 0.75 mg/dL (ref 0.40–1.20)
GFR: 79.37 mL/min (ref >60.00)
Glucose, Bld: 83 mg/dL (ref 70–99)
Potassium: 4.2 mEq/L (ref 3.5–5.1)
SODIUM: 140 meq/L (ref 135–145)

## 2014-11-06 LAB — TSH: TSH: 0.43 u[IU]/mL (ref 0.35–4.50)

## 2014-11-06 NOTE — Telephone Encounter (Signed)
Spoke with pt. She doesn't think she was ever contacted at CT appt but is still having intermittent abdominal pain and is agreeable to complete CT scan. Lab appt scheduled for today at 11am. Future order entered.

## 2014-11-08 ENCOUNTER — Encounter: Payer: Self-pay | Admitting: Family

## 2014-11-08 ENCOUNTER — Telehealth: Payer: Self-pay | Admitting: Family

## 2014-11-08 DIAGNOSIS — E039 Hypothyroidism, unspecified: Secondary | ICD-10-CM

## 2014-11-08 NOTE — Telephone Encounter (Signed)
Please contact pt and let her know that her synthroid dose appears a little too high.  Decrease synthroid from 112 mcg to 118mcg, repeat tsh in 6 weeks.

## 2014-11-09 MED ORDER — LEVOTHYROXINE SODIUM 100 MCG PO TABS: 100.0000 ug | ORAL_TABLET | Freq: Every day | ORAL | Status: DC

## 2014-11-09 NOTE — Telephone Encounter (Signed)
Left message on cell# to return my call.

## 2014-11-09 NOTE — Telephone Encounter (Signed)
Notified pt and she voices understanding. States she will use the 147mcg she still has at home. RX sent to place on file until pt needs Rx. Lab appt scheduled for 12/21/14 at 9:30am. Lab order entered.

## 2014-11-09 NOTE — Telephone Encounter (Signed)
Patient returned phone call. °

## 2014-11-14 ENCOUNTER — Other Ambulatory Visit: Payer: Medicare Other

## 2014-11-28 ENCOUNTER — Ambulatory Visit (HOSPITAL_COMMUNITY)
Admission: RE | Admit: 2014-11-28 | Discharge: 2014-11-28 | Disposition: A | Discharge status: Home / Self Care | Payer: Medicare Other | Source: Ambulatory Visit | Attending: Physician Assistant | Admitting: Physician Assistant

## 2014-11-28 ENCOUNTER — Other Ambulatory Visit (HOSPITAL_BASED_OUTPATIENT_CLINIC_OR_DEPARTMENT_OTHER): Payer: Medicare Other

## 2014-11-28 DIAGNOSIS — I313 Pericardial effusion (noninflammatory): Secondary | ICD-10-CM | POA: Insufficient documentation

## 2014-11-28 DIAGNOSIS — R918 Other nonspecific abnormal finding of lung field: Secondary | ICD-10-CM | POA: Diagnosis not present

## 2014-11-28 DIAGNOSIS — C8220 Follicular lymphoma grade III, unspecified, unspecified site: Secondary | ICD-10-CM

## 2014-11-28 DIAGNOSIS — Q069 Congenital malformation of spinal cord, unspecified: Secondary | ICD-10-CM | POA: Diagnosis not present

## 2014-11-28 DIAGNOSIS — C8590 Non-Hodgkin lymphoma, unspecified, unspecified site: Secondary | ICD-10-CM

## 2014-11-28 DIAGNOSIS — K449 Diaphragmatic hernia without obstruction or gangrene: Secondary | ICD-10-CM | POA: Diagnosis not present

## 2014-11-28 DIAGNOSIS — Q061 Hypoplasia and dysplasia of spinal cord: Secondary | ICD-10-CM | POA: Diagnosis not present

## 2014-11-28 DIAGNOSIS — D469 Myelodysplastic syndrome, unspecified: Secondary | ICD-10-CM

## 2014-11-28 DIAGNOSIS — I517 Cardiomegaly: Secondary | ICD-10-CM | POA: Diagnosis not present

## 2014-11-28 DIAGNOSIS — IMO0001 Reserved for inherently not codable concepts without codable children: Secondary | ICD-10-CM

## 2014-11-28 LAB — GLUCOSE, CAPILLARY: Glucose-Capillary: 82 mg/dL (ref 65–99)

## 2014-11-28 LAB — COMPREHENSIVE METABOLIC PANEL (CC13)
ALT: 20 U/L (ref 0–55)
ANION GAP: 10 meq/L (ref 3–11)
AST: 26 U/L (ref 5–34)
Albumin: 4.2 g/dL (ref 3.5–5.0)
Alkaline Phosphatase: 92 U/L (ref 40–150)
BILIRUBIN TOTAL: 0.48 mg/dL (ref 0.20–1.20)
BUN: 12.2 mg/dL (ref 7.0–26.0)
CALCIUM: 9.8 mg/dL (ref 8.4–10.4)
CHLORIDE: 108 meq/L (ref 98–109)
CO2: 26 mEq/L (ref 22–29)
Creatinine: 0.7 mg/dL (ref 0.6–1.1)
EGFR: 80 mL/min/{1.73_m2} — AB (ref >90)
GLUCOSE: 78 mg/dL (ref 70–140)
Potassium: 4.1 mEq/L (ref 3.5–5.1)
SODIUM: 144 meq/L (ref 136–145)
Total Protein: 6.9 g/dL (ref 6.4–8.3)

## 2014-11-28 LAB — CBC WITH DIFFERENTIAL/PLATELET
BASO%: 1.7 % (ref 0.0–2.0)
BASOS ABS: 0.1 10*3/uL (ref 0.0–0.1)
EOS%: 5 % (ref 0.0–7.0)
Eosinophils Absolute: 0.3 10*3/uL (ref 0.0–0.5)
HEMATOCRIT: 43.1 % (ref 34.8–46.6)
HEMOGLOBIN: 14.6 g/dL (ref 11.6–15.9)
LYMPH%: 24.8 % (ref 14.0–49.7)
MCH: 31.9 pg (ref 25.1–34.0)
MCHC: 33.8 g/dL (ref 31.5–36.0)
MCV: 94.3 fL (ref 79.5–101.0)
MONO#: 0.5 10*3/uL (ref 0.1–0.9)
MONO%: 9 % (ref 0.0–14.0)
NEUT%: 59.5 % (ref 38.4–76.8)
NEUTROS ABS: 3.5 10*3/uL (ref 1.5–6.5)
Platelets: 312 10*3/uL (ref 145–400)
RBC: 4.57 10*6/uL (ref 3.70–5.45)
RDW: 13.6 % (ref 11.2–14.5)
WBC: 5.8 10*3/uL (ref 3.9–10.3)
lymph#: 1.4 10*3/uL (ref 0.9–3.3)

## 2014-11-28 LAB — LACTATE DEHYDROGENASE (CC13): LDH: 227 U/L (ref 125–245)

## 2014-11-28 MED ORDER — FLUDEOXYGLUCOSE F - 18 (FDG) INJECTION
7.0000 | Freq: Once | INTRAVENOUS | Status: AC | PRN
  Administered 2014-11-28: 7 via INTRAVENOUS

## 2014-11-30 LAB — SPEP & IFE WITH QIG
ALBUMIN ELP: 4.1 g/dL (ref 3.8–4.8)
Alpha-1-Globulin: 0.5 g/dL — ABNORMAL HIGH (ref 0.2–0.3)
Alpha-2-Globulin: 0.7 g/dL (ref 0.5–0.9)
BETA 2: 0.3 g/dL (ref 0.2–0.5)
BETA GLOBULIN: 0.4 g/dL (ref 0.4–0.6)
GAMMA GLOBULIN: 0.7 g/dL — AB (ref 0.8–1.7)
IgA: 59 mg/dL — ABNORMAL LOW (ref 69–380)
IgG (Immunoglobin G), Serum: 847 mg/dL (ref 690–1700)
IgM, Serum: 18 mg/dL — ABNORMAL LOW (ref 52–322)
Total Protein, Serum Electrophoresis: 6.6 g/dL (ref 6.1–8.1)

## 2014-12-04 ENCOUNTER — Ambulatory Visit (INDEPENDENT_AMBULATORY_CARE_PROVIDER_SITE_OTHER): Payer: Medicare Other | Admitting: *Deleted

## 2014-12-04 ENCOUNTER — Telehealth: Payer: Self-pay | Admitting: Cardiology

## 2014-12-04 DIAGNOSIS — I495 Sick sinus syndrome: Secondary | ICD-10-CM | POA: Diagnosis not present

## 2014-12-04 NOTE — Progress Notes (Signed)
Remote pacemaker transmission.   

## 2014-12-04 NOTE — Telephone Encounter (Signed)
Spoke with pt and reminded pt of remote transmission that is due today. Pt verbalized understanding.   

## 2014-12-05 ENCOUNTER — Ambulatory Visit (HOSPITAL_BASED_OUTPATIENT_CLINIC_OR_DEPARTMENT_OTHER): Payer: Medicare Other | Admitting: Internal Medicine

## 2014-12-05 ENCOUNTER — Telehealth: Payer: Self-pay | Admitting: Internal Medicine

## 2014-12-05 ENCOUNTER — Encounter: Payer: Self-pay | Admitting: Internal Medicine

## 2014-12-05 ENCOUNTER — Telehealth: Payer: Self-pay | Admitting: Cardiovascular Disease

## 2014-12-05 VITALS — BP 143/91 | HR 88 | Temp 97.7°F | Resp 18 | Ht 64.0 in | Wt 142.1 lb

## 2014-12-05 DIAGNOSIS — C8280 Other types of follicular lymphoma, unspecified site: Secondary | ICD-10-CM

## 2014-12-05 DIAGNOSIS — C8590 Non-Hodgkin lymphoma, unspecified, unspecified site: Secondary | ICD-10-CM

## 2014-12-05 NOTE — Telephone Encounter (Signed)
s.w. pt and advised on DEC appt....pt requested mail...mailed pt appt sched/avs adn letter

## 2014-12-05 NOTE — Progress Notes (Signed)
[place] [phone]   Fax:(336) [phone]  OFFICE PROGRESS NOTE  [name], NP [place] [address]  DIAGNOSIS: recurrent Non Hodgkin's lymphoma, follicular center cell type with a predominant follicular pattern.Grade (if applicable): Favor high grade (grade 3/3), initially diagnosed in November of 2012   PRIOR THERAPY:  1) Status post treatment with Rituxan weekly for 3 doses in addition to 3 tablets of Afinitor at [facility] in [place].  2) Status post surgical excision of the right neck mass under the care of Dr. Johney Maine on 10/28/2012.  3)  Maintenance Rituxan 375 mg/M2 every 2 months status post 12 cycles, last dose was given 10/01/2014.  CURRENT THERAPY: Observation.  ADVANCED DIRECTIVE: Does not have advanced directive.   INTERVAL HISTORY: [name] 80 y.o. female returns to the clinic today for 24-month followup visit. The patient is tolerating her current maintenance treatment with Rituxan fairly well. She has no palpable lymphadenopathy. She denied having any significant nausea or vomiting. She has no fever or chills. The patient denied having any significant weight loss or night sweats. She has no chest pain, shortness of breath, cough or hemoptysis. She had repeat PET scan performed recently and she is here for evaluation and discussion of her lab and scan results.  MEDICAL HISTORY: Past Medical History  Diagnosis Date  . Hypothyroidism   . Anemia     pt denies  . Osteoporosis   . Pleural effusion, bilateral     history of  . Pulmonary nodules     history of  . History of pancreatitis     pt denies  . Cystocele     history with rectocele, pt denies  . Scoliosis   . Varicose vein   . Non Hodgkin's lymphoma   . Acute bronchitis 10/09/2012  . Pacemaker 09/15/2011    Medtronic Revo  . Dysrhythmia   . Shortness of breath     with exertion  . GERD (gastroesophageal reflux  disease)   . H/O hiatal hernia   . Arthritis   . Cataract     bil  . Hemorrhoid   . Pericarditis   . Atrial fibrillation     ALLERGIES:  is allergic to amoxicillin; codeine; demerol; morphine and related; vicodin; and sulfonamide derivatives.  MEDICATIONS:  Current Outpatient Prescriptions  Medication Sig Dispense Refill  . acetaminophen (TYLENOL) 500 MG tablet Take 500 mg by mouth every 6 (six) hours as needed for pain.      [name] ALPRAZolam (XANAX) 0.5 MG tablet take 1 tablet by mouth once daily if needed for sleep  30 tablet  0  . amLODipine (NORVASC) 10 MG tablet take 1 tablet by mouth once daily  DUE FOR A FOLLOW-UP OFFICE VISIT  90 tablet  1  . aspirin 81 MG tablet Take 81 mg by mouth daily.      . Calcium Carbonate-Vitamin D (CALTRATE 600+D) 600-400 MG-UNIT per tablet Take 1 tablet by mouth 2 (two) times daily.      [name] denosumab (PROLIA) 60 MG/ML SOLN injection Inject 60 mg into the skin every 6 (six) months. Administer in upper arm, thigh, or abdomen      . ferrous fumarate (HEMOCYTE - 106 MG FE) 325 (106 FE) MG TABS tablet Take 1 tablet by mouth.      . gabapentin (NEURONTIN) 100 MG capsule take 1 capsule by mouth three times a day  90 capsule  2  .  KRILL OIL OMEGA-3 PO Take 1 capsule by mouth daily.      [name] levothyroxine (SYNTHROID, LEVOTHROID) 100 MCG tablet take 1 tablet by mouth once daily  90 tablet  1  . omeprazole (PRILOSEC) 40 MG capsule Take 1 capsule (40 mg total) by mouth daily.  180 capsule  1  . vitamin B-12 (CYANOCOBALAMIN) 100 MCG tablet Take 100 mcg by mouth daily.       No current facility-administered medications for this visit.    SURGICAL HISTORY:  Past Surgical History  Procedure Laterality Date  . Pacemaker insertion  09/15/11    MDT  . Tonsillectomy    . Breast surgery  07/30/01    biopsy x 3, bilateral  . Abdominal hysterectomy  04/29/04    partial  . Mass excision Right 10/28/2012    Procedure: Removal of mass on neck       ;  Surgeon: Adin Hector,  MD;  Location: [facility];  Service: General;  Laterality: Right;  . Colonoscopy    . Eye surgery      cataracts  . Permanent pacemaker insertion N/A 09/15/2011    Procedure: PERMANENT PACEMAKER INSERTION;  Surgeon: Sanda Klein, MD;  Location: [facility];  Service: Cardiovascular;  Laterality: N/A;    REVIEW OF SYSTEMS:  Constitutional: negative Eyes: negative Ears, nose, mouth, throat, and face: negative Respiratory: negative Cardiovascular: negative Gastrointestinal: negative Genitourinary:negative Integument/breast: negative Hematologic/lymphatic: negative Musculoskeletal:negative Neurological: negative Behavioral/Psych: negative Endocrine: negative Allergic/Immunologic: negative   PHYSICAL EXAMINATION: General appearance: alert, cooperative and no distress Head: Normocephalic, without obvious abnormality, atraumatic Neck: no adenopathy, no JVD, supple, symmetrical, trachea midline and thyroid not enlarged, symmetric, no tenderness/mass/nodules Lymph nodes: Cervical, supraclavicular, and axillary nodes normal. Resp: clear to auscultation bilaterally Back: symmetric, no curvature. ROM normal. No CVA tenderness. Cardio: regular rate and rhythm, S1, S2 normal, no murmur, click, rub or gallop GI: soft, non-tender; bowel sounds normal; no masses,  no organomegaly Extremities: extremities normal, atraumatic, no cyanosis or edema Neurologic: Alert and oriented X 3, normal strength and tone. Normal symmetric reflexes. Normal coordination and gait  ECOG PERFORMANCE STATUS: 0 - Asymptomatic  Blood pressure 143/91, pulse 88, temperature 97.7 F (36.5 C), temperature source Oral, resp. rate 18, height 5\' 4"  (1.626 m), weight 142 lb 1.6 oz (64.456 kg), SpO2 100 %.  LABORATORY DATA: Lab Results  Component Value Date   WBC 5.8 11/28/2014   HGB 14.6 11/28/2014   HCT 43.1 11/28/2014   MCV 94.3 11/28/2014   PLT 312 11/28/2014      Chemistry      Component Value Date/Time   NA 144  11/28/2014 0957   NA 140 11/06/2014 1059   K 4.1 11/28/2014 0957   K 4.2 11/06/2014 1059   CL 105 11/06/2014 1059   CL 109* 12/14/2012 1009   CO2 26 11/28/2014 0957   CO2 29 11/06/2014 1059   BUN 12.2 11/28/2014 0957   BUN 12 11/06/2014 1059   CREATININE 0.7 11/28/2014 0957   CREATININE 0.75 11/06/2014 1059   CREATININE 0.69 08/07/2013 1149      Component Value Date/Time   CALCIUM 9.8 11/28/2014 0957   CALCIUM 9.9 11/06/2014 1059   ALKPHOS 92 11/28/2014 0957   ALKPHOS 77 10/06/2012 1202   AST 26 11/28/2014 0957   AST 28 10/06/2012 1202   ALT 20 11/28/2014 0957   ALT 19 10/06/2012 1202   BILITOT 0.48 11/28/2014 0957   BILITOT 0.3 10/06/2012 1202  RADIOGRAPHIC STUDIES: Nm Pet Image Restag (ps) Skull Base To Thigh  11/28/2014   CLINICAL DATA:  Subsequent treatment strategy for non-Hodgkin's lymphoma.  EXAM: NUCLEAR MEDICINE PET SKULL BASE TO THIGH  TECHNIQUE: 7.0 mCi F-18 FDG was injected intravenously. Full-ring PET imaging was performed from the skull base to thigh after the radiotracer. CT data was obtained and used for attenuation correction and anatomic localization.  FASTING BLOOD GLUCOSE:  Value: 82 mg/dl  COMPARISON:  Multiple prior PET CTs.  The most recent is 11/16/2012  FINDINGS: NECK  No hypermetabolic lymph nodes in the neck. Stable diffuse thyroid gland hypermetabolism likely due to chronic thyroiditis.  CHEST  No hypermetabolic mediastinal or hilar nodes. No suspicious pulmonary nodules on the CT scan.  Scattered scarring changes are again demonstrated. There are stable small pulmonary nodules in the left lung but no hypermetabolism. Stable calcified granulomas in the right upper lobe.  Stable cardiac enlargement and small pericardial effusion. Stable large hiatal hernia  ABDOMEN/PELVIS  No abnormal hypermetabolic activity within the liver, pancreas, adrenal glands, or spleen. No hypermetabolic lymph nodes in the abdomen or pelvis.  SKELETON  No focal hypermetabolic  activity to suggest skeletal metastasis.  IMPRESSION: 1. No findings to suggest recurrent lymphoma. 2. Stable diffuse hypermetabolism in the thyroid gland, most likely chronic thyroiditis. 3. Stable bilateral pulmonary nodules.   Electronically Signed   By: Marijo Sanes M.D.   On: 11/28/2014 12:45   ASSESSMENT AND PLAN: This is a very pleasant 80 years old white female with recurrent non-Hodgkin's lymphoma, follicular center cell type currently on maintenance treatment with single agent Rituxan status post 12 cycles. The patient is tolerating her treatment fairly well with no significant adverse effects. The recent PET scan showed no evidence for recurrent lymphoma. I discussed the scan results with the patient today and recommended for her to continue on observation. I would see her back for follow-up visit in 6 months with repeat CBC, comprehensive metabolic panel and LDH. She was advised to call immediately if she has any concerning symptoms in the interval. The patient voices understanding of current disease status and treatment options and is in agreement with the current care plan. All questions were answered. The patient knows to call the clinic with any problems, questions or concerns. We can certainly see the patient much sooner if necessary.  Disclaimer: This note was dictated with voice recognition software. Similar sounding words can inadvertently be transcribed and may not be corrected upon review.

## 2014-12-05 NOTE — Telephone Encounter (Signed)
New Message      Pt calling wanting to schedule an appt with Dr. Sallyanne Kuster after some things she found out from a recent pet scan and wants to be seen prior to August and doesn't want to see a PA. Please call back and advise.

## 2014-12-06 ENCOUNTER — Telehealth: Payer: Self-pay | Admitting: Cardiovascular Disease

## 2014-12-06 DIAGNOSIS — R609 Edema, unspecified: Secondary | ICD-10-CM

## 2014-12-06 DIAGNOSIS — R0600 Dyspnea, unspecified: Secondary | ICD-10-CM

## 2014-12-06 NOTE — Telephone Encounter (Signed)
Spoke to patient She states she had a PET SCAN ordered by Dr Earlie Server It showed fluid around the heart per patient. She states  Dr Earlie Server did not address subject. She is concerned. She states shortness of breath for several months , chest pain at night only No changes in sleep pattern,she does not sleep well anyway per patient. She states at the end of day  Ankles swell. She has not  been weighing herself daily. Earliest appointment is 01/02/15 with [facility] --appointment given.  Will defer to Dr Sallyanne Kuster - for other options.

## 2014-12-06 NOTE — Telephone Encounter (Signed)
Pericardial effusion was small and she should not be concerned about it. Her symptoms justify a repeat echo (this will also give [place] a more accurate assessment of her pericardial fluid) -please order for dyspnea and edema and have it done before her appt.[name]

## 2014-12-06 NOTE — Telephone Encounter (Signed)
New message         C/O fluid around the heart   pt would like to be advised concerning this   Is it ok to wait until August to come for an appt

## 2014-12-07 LAB — CUP PACEART REMOTE DEVICE CHECK
Battery Voltage: 2.99 V
Brady Statistic RA Percent Paced: 91.23 %
Date Time Interrogation Session: 20160607174236
Lead Channel Impedance Value: 376 Ohm
Lead Channel Impedance Value: 512 Ohm
Lead Channel Sensing Intrinsic Amplitude: 18.079 mV
Lead Channel Setting Pacing Amplitude: 2 V
MDC IDC MSMT LEADCHNL RA SENSING INTR AMPL: 1.198 mV
MDC IDC SET LEADCHNL RV SENSING SENSITIVITY: 0.9 mV
Zone Setting Detection Interval: 390 ms
Zone Setting Detection Interval: 400 ms

## 2014-12-07 NOTE — Telephone Encounter (Signed)
Spoke to patient.  information given to patient. She aware we will schedule echo and contact her.  Patient also would like Dr. Sallyanne Kuster to review her recent  Labs as well. Forward to Dr C.

## 2014-12-11 ENCOUNTER — Telehealth: Payer: Self-pay | Admitting: *Deleted

## 2014-12-17 ENCOUNTER — Encounter: Payer: Self-pay | Admitting: Cardiology

## 2014-12-18 ENCOUNTER — Ambulatory Visit (HOSPITAL_COMMUNITY)
Admission: RE | Admit: 2014-12-18 | Discharge: 2014-12-18 | Disposition: A | Discharge status: Home / Self Care | Payer: Medicare Other | Source: Ambulatory Visit | Attending: Cardiovascular Disease | Admitting: Cardiovascular Disease

## 2014-12-18 DIAGNOSIS — R0600 Dyspnea, unspecified: Secondary | ICD-10-CM

## 2014-12-18 DIAGNOSIS — I313 Pericardial effusion (noninflammatory): Secondary | ICD-10-CM | POA: Insufficient documentation

## 2014-12-18 DIAGNOSIS — R609 Edema, unspecified: Secondary | ICD-10-CM

## 2014-12-19 ENCOUNTER — Other Ambulatory Visit: Payer: Self-pay | Admitting: Family

## 2014-12-19 NOTE — Telephone Encounter (Signed)
Informed patient of med refill and appointment scheduled for 12/26/14

## 2014-12-19 NOTE — Telephone Encounter (Signed)
Rx faxed to pharmacy at 3:15pm.  Please call pt to schedule routine follow up with [facility] before further medication refills are due. Pt was due for 3 month follow up with [place] in March.

## 2014-12-19 NOTE — Telephone Encounter (Signed)
Last office visit 07/18/14. Pt past due for follow up. Last UDS 06/2014, no CSC on file.  Rx printed and forwarded to Provider for signature.    Medication name:  Name from pharmacy:  ALPRAZolam (XANAX) 0.5 MG tablet ALPRAZOLAM 0.5 MG TABLET     Sig: take 1 tablet by mouth at bedtime if needed FOR SLEEP    Dispense: 30 tablet (Pharmacy requested 30)   Refills: 0   Start: 12/19/2014   Class: Normal    Requested on: 09/20/2014    Originally ordered on: 08/07/2013 09/20/2014

## 2014-12-21 ENCOUNTER — Encounter: Payer: Self-pay | Admitting: Internal Medicine

## 2014-12-21 ENCOUNTER — Other Ambulatory Visit (INDEPENDENT_AMBULATORY_CARE_PROVIDER_SITE_OTHER): Payer: Medicare Other

## 2014-12-21 ENCOUNTER — Other Ambulatory Visit: Payer: Medicare Other

## 2014-12-21 ENCOUNTER — Telehealth: Payer: Self-pay | Admitting: Internal Medicine

## 2014-12-21 DIAGNOSIS — E039 Hypothyroidism, unspecified: Secondary | ICD-10-CM | POA: Diagnosis not present

## 2014-12-21 LAB — TSH: TSH: 1.21 u[IU]/mL (ref 0.35–4.50)

## 2014-12-21 NOTE — Telephone Encounter (Signed)
per pof pt needd a lab for 6/24-no sch-sch and sent pt to registration

## 2014-12-23 ENCOUNTER — Encounter: Payer: Self-pay | Admitting: Family

## 2014-12-26 ENCOUNTER — Ambulatory Visit (INDEPENDENT_AMBULATORY_CARE_PROVIDER_SITE_OTHER): Payer: Medicare Other | Admitting: Family

## 2014-12-26 ENCOUNTER — Encounter: Payer: Self-pay | Admitting: Family

## 2014-12-26 VITALS — BP 104/68 | HR 67 | Temp 98.0°F | Resp 16 | Ht 65.0 in | Wt 142.0 lb

## 2014-12-26 DIAGNOSIS — E039 Hypothyroidism, unspecified: Secondary | ICD-10-CM

## 2014-12-26 DIAGNOSIS — I10 Essential (primary) hypertension: Secondary | ICD-10-CM

## 2014-12-26 DIAGNOSIS — Z79899 Other long term (current) drug therapy: Secondary | ICD-10-CM | POA: Diagnosis not present

## 2014-12-26 DIAGNOSIS — G629 Polyneuropathy, unspecified: Secondary | ICD-10-CM | POA: Insufficient documentation

## 2014-12-26 LAB — VITAMIN B12: Vitamin B-12: 984 pg/mL — ABNORMAL HIGH (ref 211–911)

## 2014-12-26 LAB — FOLATE: FOLATE: 11.9 ng/mL (ref >5.9)

## 2014-12-26 NOTE — Assessment & Plan Note (Signed)
Etiology unclear.  Advised her to increase gabapentin to tid as tolerated (she is only taking in the AM currently)

## 2014-12-26 NOTE — Assessment & Plan Note (Signed)
BP low today.  Could be contributing to her fatigue. Cut amlodipine in half and take 1/2 tab by mouth daily- call in 1 week with readings. She has cbc scheduled to be drawn by hematology.

## 2014-12-26 NOTE — Assessment & Plan Note (Signed)
TSH stable, continue synthroid ° °

## 2014-12-26 NOTE — Progress Notes (Signed)
Subjective:    Patient ID: [name], female    DOB: April 16, 1935, 80 y.o.   MRN: [id number]  HPI  [name] is a 80 yr old female who presents today for follow up.   NHL- being managed per Dr. Inda Merlin  HTN- current meds include amlodipine 5mg  once daily.   BP Readings from Last 3 Encounters:  12/26/14 104/68  12/05/14 143/91  10/04/14 114/63   Hypothyroid- current synthroid dose 153mcg. Lab Results  Component Value Date   TSH 1.21 12/21/2014    She has two concerns:  Tinnitus "alll the time."   Also notes feet have "pins sticking in my feet, hot then cold." feels like I have a sock on .    Review of Systems See HPI  Past Medical History  Diagnosis Date  . Hypothyroidism   . Anemia     pt denies  . Osteoporosis   . Pleural effusion, bilateral     history of  . Pulmonary nodules     history of  . History of pancreatitis     pt denies  . Cystocele     history with rectocele, pt denies  . Scoliosis   . Varicose vein   . Non Hodgkin's lymphoma   . Acute bronchitis 10/09/2012  . Pacemaker 09/15/2011    Medtronic Revo  . Dysrhythmia   . Shortness of breath     with exertion  . GERD (gastroesophageal reflux disease)   . H/O hiatal hernia   . Arthritis   . Cataract     bil  . Hemorrhoid   . Pericarditis   . Atrial fibrillation     History   Social History  . Marital Status: Widowed    Spouse Name: N/A  . Number of Children: 3  . Years of Education: N/A   Occupational History  . self employed    Social History Main Topics  . Smoking status: Never Smoker   . Smokeless tobacco: Never Used  . Alcohol Use: Yes     Comment: 1 glass of wine occasionally  . Drug Use: No  . Sexual Activity: Not on file   Other Topics Concern  . 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  She lives alone in a 3-story home.  There is a lift chair which she does not  use (placed for her husband).    Past Surgical History  Procedure Laterality Date  . Pacemaker insertion  09/15/11    MDT  . Tonsillectomy    . Breast surgery  07/30/01    biopsy x 3, bilateral  . Abdominal hysterectomy  04/29/04    partial  . Mass excision Right 10/28/2012    Procedure: Removal of mass on neck       ;  Surgeon: Adin Hector, MD;  Location: [facility];  Service: General;  Laterality: Right;  . Colonoscopy    . Eye surgery      cataracts  . Permanent pacemaker insertion N/A 09/15/2011    Procedure: PERMANENT PACEMAKER INSERTION;  Surgeon: Sanda Klein, MD;  Location: [facility] CATH LAB;  Service: Cardiovascular;  Laterality: N/A;    Family History  Problem Relation Age of Onset  . Heart disease Daughter     congenital "whole in her heart"  . Stroke Mother   . Lymphoma Daughter     nonhodgkins  . Stroke Father   . Breast  cancer Sister   . Heart disease Brother     Allergies  Allergen Reactions  . Amoxicillin Hives    Hives  . Codeine Hives    REACTION: "makes her crazy" Hives  . Demerol Hives  . Morphine And Related Hives  . Vicodin [Hydrocodone-Acetaminophen] Hives  . Sulfonamide Derivatives Rash    REACTION: Rash    Current Outpatient Prescriptions on File Prior to Visit  Medication Sig Dispense Refill  . acetaminophen (TYLENOL) 500 MG tablet Take 500 mg by mouth every 6 (six) hours as needed for pain.    [name] ALPRAZolam (XANAX) 0.5 MG tablet take 1 tablet by mouth at bedtime if needed FOR SLEEP 30 tablet 0  . amLODipine (NORVASC) 5 MG tablet take 1 tablet by mouth once daily 30 tablet 0  . aspirin 81 MG tablet Take 81 mg by mouth daily.    . Calcium Carbonate-Vitamin D (CALTRATE 600+D) 600-400 MG-UNIT per tablet Take 1 tablet by mouth 2 (two) times daily.    [name] denosumab (PROLIA) 60 MG/ML SOLN injection Inject 60 mg into the skin every 6 (six) months. Administer in upper arm, thigh, or abdomen    . ferrous fumarate (HEMOCYTE - 106 MG FE) 325 (106 FE) MG TABS  tablet Take 1 tablet by mouth.    . gabapentin (NEURONTIN) 100 MG capsule take 1 capsule by mouth three times a day 90 capsule 0  . KRILL OIL OMEGA-3 PO Take 1 capsule by mouth daily.    [name] levothyroxine (SYNTHROID, LEVOTHROID) 100 MCG tablet Take 1 tablet (100 mcg total) by mouth daily. 30 tablet 1  . omeprazole (PRILOSEC) 40 MG capsule take 1 capsule by mouth once daily 30 capsule 5  . vitamin B-12 (CYANOCOBALAMIN) 100 MCG tablet Take 100 mcg by mouth daily.     No current facility-administered medications on file prior to visit.    BP 104/68 mmHg  Pulse 67  Temp(Src) 98 F (36.7 C) (Oral)  Resp 16  Ht 5\' 5"  (1.651 m)  Wt 142 lb (64.411 kg)  BMI 23.63 kg/m2  SpO2 98%       Objective:   Physical Exam  Constitutional: She appears well-developed and well-nourished.  Cardiovascular: Normal rate, regular rhythm and normal heart sounds.   No murmur heard. Pulmonary/Chest: Effort normal and breath sounds normal. No respiratory distress. She has no wheezes.  Neurological:  Bilateral great toes with decreased sensation to monofilament  Psychiatric: She has a normal mood and affect. Her behavior is normal. Judgment and thought content normal.          Assessment & Plan:

## 2014-12-26 NOTE — Patient Instructions (Addendum)
Cut amlodipine 5mg  in half and take 1/2 tab by mouth once daily. Increase gabapentin to 3 times daily as tolerated. Check blood pressure once daily for 1 week, then contact me with your readings. Complete lab work prior to leaving.   [name]

## 2014-12-26 NOTE — Progress Notes (Signed)
Pre visit review using our clinic review tool, if applicable. No additional management support is needed unless otherwise documented below in the visit note. 

## 2014-12-27 ENCOUNTER — Encounter: Payer: Self-pay | Admitting: Family

## 2015-01-02 ENCOUNTER — Ambulatory Visit (INDEPENDENT_AMBULATORY_CARE_PROVIDER_SITE_OTHER): Payer: Medicare Other | Admitting: Cardiology

## 2015-01-02 ENCOUNTER — Encounter: Payer: Self-pay | Admitting: Cardiology

## 2015-01-02 VITALS — BP 118/80 | HR 69 | Ht 65.0 in | Wt 142.8 lb

## 2015-01-02 DIAGNOSIS — Z950 Presence of cardiac pacemaker: Secondary | ICD-10-CM | POA: Diagnosis not present

## 2015-01-02 DIAGNOSIS — I319 Disease of pericardium, unspecified: Secondary | ICD-10-CM

## 2015-01-02 DIAGNOSIS — R0781 Pleurodynia: Secondary | ICD-10-CM

## 2015-01-02 DIAGNOSIS — R0609 Other forms of dyspnea: Secondary | ICD-10-CM | POA: Diagnosis not present

## 2015-01-02 DIAGNOSIS — R002 Palpitations: Secondary | ICD-10-CM

## 2015-01-02 DIAGNOSIS — I3139 Other pericardial effusion (noninflammatory): Secondary | ICD-10-CM

## 2015-01-02 DIAGNOSIS — I313 Pericardial effusion (noninflammatory): Secondary | ICD-10-CM

## 2015-01-02 NOTE — Patient Instructions (Signed)
Your physician recommends that you schedule a follow-up appointment in: 3-4 months with Dr. Sallyanne Kuster.  A order has been placed for you to get a Rib xray at [place]. [facility]. No appointment needed.

## 2015-01-02 NOTE — Progress Notes (Signed)
Cardiology Office Note   Date:  01/02/2015   ID:  [name], DOB 12/22/1934, MRN [id number]  PCP:  [name], NP  Cardiologist:  Dr. Bertrum Sol     Chief Complaint  Patient presents with  . Palpitations    c/o no chest pain, some SOB, a little light headed at times, no swelling in legs      History of Present Illness: [name] is a 80 y.o. female who presents for palpations and fluid around her heart on PET scan.   She has a history of symptomatic sinus node dysfunction/bradycardia for which a pacemaker was implanted in March 2013 (dual chamber Medtronic Revo, MRI conditional). She developed post procedural acute pericarditis and paroxysmal atrial fibrillation. She did not require pericardiocentesis. The symptoms subsided with anti-inflammatory therapy and the atrial fibrillation resolved. It has not recurred since. She was initially treated with antiarrhythmics but these have been since discontinued.  She has a history of non-Hodgkin's lymphoma and felt that she was "cured" but had to recently undergo a lymphadenectomy for cervical lymph node that proves to have evidence of lymphoma, probably high-grade. She is on Rituxan maintenance therapy under Dr. Worthy Flank supervision. She has mild hypertension and treated hypothyroidism.  Recently pt had palpitations both rapid and skipped and SOB.  She wore a 24 hour holter that had occ. V pacing and occ PACs.  An Echo was done Left ventricle: The cavity size was normal. Wall thickness was normal. Systolic function was normal. The estimated ejection fraction was in the range of 55% to 60%. Wall motion was normal; there were no regional wall motion abnormalities. Doppler parameters are consistent with abnormal left ventricular relaxation (grade 1 diastolic dysfunction). - Pulmonary arteries: PA peak pressure: 31 mm Hg (S). - Pericardium, extracardiac: A trivial pericardial effusion was identified.  Pt  believes the pericardial effusion had improved along with her symptoms prior to the echo being done.  Currently without problems.    PET SCAN" Stable cardiac enlargement and small pericardial effusion. Stable large hiatal hernia"     Also rt rib diaphragmatic pain after fall.  Wt 138 in 01/2014  Past Medical History  Diagnosis Date  . Hypothyroidism   . Anemia     pt denies  . Osteoporosis   . Pleural effusion, bilateral     history of  . Pulmonary nodules     history of  . History of pancreatitis     pt denies  . Cystocele     history with rectocele, pt denies  . Scoliosis   . Varicose vein   . Non Hodgkin's lymphoma   . Acute bronchitis 10/09/2012  . Pacemaker 09/15/2011    Medtronic Revo  . Dysrhythmia   . Shortness of breath     with exertion  . GERD (gastroesophageal reflux disease)   . H/O hiatal hernia   . Arthritis   . Cataract     bil  . Hemorrhoid   . Pericarditis   . Atrial fibrillation     Past Surgical History  Procedure Laterality Date  . Pacemaker insertion  09/15/11    MDT  . Tonsillectomy    . Breast surgery  07/30/01    biopsy x 3, bilateral  . Abdominal hysterectomy  04/29/04    partial  . Mass excision Right 10/28/2012    Procedure: Removal of mass on neck       ;  Surgeon: Adin Hector, MD;  Location: [facility];  Service: General;  Laterality: Right;  . Colonoscopy    . Eye surgery      cataracts  . Permanent pacemaker insertion N/A 09/15/2011    Procedure: PERMANENT PACEMAKER INSERTION;  Surgeon: Sanda Klein, MD;  Location: [facility] CATH LAB;  Service: Cardiovascular;  Laterality: N/A;     Current Outpatient Prescriptions  Medication Sig Dispense Refill  . acetaminophen (TYLENOL) 500 MG tablet Take 500 mg by mouth every 6 (six) hours as needed for pain.    [name] ALPRAZolam (XANAX) 0.5 MG tablet take 1 tablet by mouth at bedtime if needed FOR SLEEP 30 tablet 0  . amLODipine (NORVASC) 5 MG tablet take 1 tablet by mouth once daily (Patient taking  differently: 1/2 tab by mouth once daily) 30 tablet 0  . aspirin 81 MG tablet Take 81 mg by mouth daily.    . Calcium Carbonate-Vitamin D (CALTRATE 600+D) 600-400 MG-UNIT per tablet Take 1 tablet by mouth 2 (two) times daily.    [name] denosumab (PROLIA) 60 MG/ML SOLN injection Inject 60 mg into the skin every 6 (six) months. Administer in upper arm, thigh, or abdomen    . ferrous fumarate (HEMOCYTE - 106 MG FE) 325 (106 FE) MG TABS tablet Take 1 tablet by mouth.    . gabapentin (NEURONTIN) 100 MG capsule take 1 capsule by mouth three times a day 90 capsule 0  . KRILL OIL OMEGA-3 PO Take 1 capsule by mouth daily.    [name] levothyroxine (SYNTHROID, LEVOTHROID) 100 MCG tablet Take 1 tablet (100 mcg total) by mouth daily. 30 tablet 1  . omeprazole (PRILOSEC) 40 MG capsule take 1 capsule by mouth once daily 30 capsule 5  . vitamin B-12 (CYANOCOBALAMIN) 100 MCG tablet Take 100 mcg by mouth daily.     No current facility-administered medications for this visit.    Allergies:   Amoxicillin; Codeine; Demerol; Morphine and related; Vicodin; and Sulfonamide derivatives    Social History:  The patient  reports that she has never smoked. She has never used smokeless tobacco. She reports that she drinks alcohol. She reports that she does not use illicit drugs.   Family History:  The patient's family history includes Breast cancer in her sister; Heart disease in her brother and daughter; Lymphoma in her daughter; Stroke in her father and mother.    ROS:  General:no colds or fevers, mild weight increase Skin:no rashes or ulcers HEENT:no blurred vision, no congestion CV:see HPI PUL:see HPI GI:no diarrhea constipation or melena, no indigestion GU:no hematuria, no dysuria MS:no joint pain, no claudication, + rib pain Neuro:no syncope, no lightheadedness Endo:no diabetes, no thyroid disease  Wt Readings from Last 3 Encounters:  01/02/15 142 lb 12.8 oz (64.774 kg)  12/26/14 142 lb (64.411 kg)  12/05/14 142 lb  1.6 oz (64.456 kg)     PHYSICAL EXAM: VS:  BP 118/80 mmHg  Pulse 69  Ht 5\' 5"  (1.651 m)  Wt 142 lb 12.8 oz (64.774 kg)  BMI 23.76 kg/m2 , BMI Body mass index is 23.76 kg/(m^2). General:Pleasant affect, NAD Skin:Warm and dry, brisk capillary refill HEENT:normocephalic, sclera clear, mucus membranes moist Neck:supple, no JVD, no bruits  Heart:S1S2 RRR without murmur, gallup, rub or click Lungs:clear without rales, rhonchi, or wheezes XIP:JASN, non tender, + BS, do not palpate liver spleen or masses, + tenderness along ribs on rt, 11 & 12 Ext:no lower ext edema, 2+ pedal pulses, 2+ radial pulses Neuro:alert and oriented X 3, MAE, follows commands, + facial symmetry    EKG:  EKG is ordered today. The ekg ordered today demonstrates atrial pacing, no changes from previous.     Recent Labs: 11/28/2014: ALT 20; BUN 12.2; Creatinine 0.7; HGB 14.6; Platelets 312; Potassium 4.1; Sodium 144 12/21/2014: TSH 1.21    Lipid Panel    Component Value Date/Time   CHOL 238* 10/06/2012 1202   TRIG 199* 10/06/2012 1202   HDL 53 10/06/2012 1202   CHOLHDL 4.5 10/06/2012 1202   VLDL 40 10/06/2012 1202   LDLCALC 145* 10/06/2012 1202       Other studies Reviewed: Additional studies/ records that were reviewed today include: previous notes.   ASSESSMENT AND PLAN:  1.  Palpitations- 24 hr monitor- with PVCs but symptoms have resolved along with SOb.   2.  Pericardial effusion on PET scan small and on echo minimal effusion, symptoms of SOB have resolved.  3.  PPM stable AAIR to avoid paced ventricular beats.  .  4. Non hodgkin's lymphoma      pt will keep August appointment with Dr. Jerilynn Mages. Croitoru.  She will call if recurrent problems  5. Rib pain after fall.- will do rib series on rt.    Current medicines are reviewed with the patient today.  The patient Has no concerns regarding medicines.  The following changes have been made:  See above Labs/ tests ordered today include:see  above  Disposition:   FU:  see above  Signed, Isaiah Serge, NP  01/02/2015 4:13 PM    [facility] [place], [place], [place], [place] Phone: [phone]; Fax: [phone]

## 2015-01-03 ENCOUNTER — Other Ambulatory Visit: Payer: Self-pay | Admitting: Family

## 2015-01-03 ENCOUNTER — Other Ambulatory Visit: Payer: Self-pay | Admitting: Obstetrics and Gynecology

## 2015-01-03 DIAGNOSIS — Z779 Other contact with and (suspected) exposures hazardous to health: Secondary | ICD-10-CM | POA: Diagnosis not present

## 2015-01-03 DIAGNOSIS — N760 Acute vaginitis: Secondary | ICD-10-CM | POA: Diagnosis not present

## 2015-01-03 DIAGNOSIS — Z90711 Acquired absence of uterus with remaining cervical stump: Secondary | ICD-10-CM | POA: Diagnosis not present

## 2015-01-04 LAB — CYTOLOGY - PAP

## 2015-01-07 ENCOUNTER — Encounter: Payer: Self-pay | Admitting: Podiatry

## 2015-01-07 ENCOUNTER — Other Ambulatory Visit: Payer: Self-pay | Admitting: Family

## 2015-01-07 ENCOUNTER — Ambulatory Visit (INDEPENDENT_AMBULATORY_CARE_PROVIDER_SITE_OTHER): Payer: Medicare Other | Admitting: Podiatry

## 2015-01-07 ENCOUNTER — Ambulatory Visit (INDEPENDENT_AMBULATORY_CARE_PROVIDER_SITE_OTHER): Payer: Medicare Other

## 2015-01-07 DIAGNOSIS — M2010 Hallux valgus (acquired), unspecified foot: Secondary | ICD-10-CM

## 2015-01-07 NOTE — Progress Notes (Signed)
   Subjective:    Patient ID: [name], female    DOB: 06/06/1935, 80 y.o.   MRN: [id number]  HPI I HAVE SOME BUNIONS ON BOTH OF MY FEET AND HAVE BEEN GOING ON FOR ABOUT A YEAR AND MY TOES DRAW UP AND ARE CURLING IN AND IS SORE AND TENDER AND BURNS AND THROBS AND NO SWELLING AND MY FEET DO GET COLD AND HOT This patient presents to office for her bunions on both feet.  She has developed callus under her bunions which are painful and burn. She says her bunions are painful walking and wearing her shoes.  She has sharp shooting pain through her bunions.  Peripheral neuropathy noted.   Review of Systems  Musculoskeletal:       MUSCLE PAIN  All other systems reviewed and are negative.      Objective:   Physical Exam.Objective: Review of past medical history, medications, social history and allergies were performed.  Vascular: Dorsalis pedis and posterior tibial pulses were palpable B/L, capillary refill was  WNL B/L, temperature gradient was WNL B/L   Skin:  No signs of symptoms of infection or ulcers on both feet.  Callus plantarly due to HAV deformities.  Nails: appear healthy with no signs of mycosis or infections  Sensory: Semmes Weinstein monifilament WNL   Orthopedic: Orthopedic evaluation demonstrates all joints distal t ankle have full ROM without crepitus, muscle power WNL B/L.  Mild dorsomedial exostosis 1st MPJ both feet.  Good ROM with no crepitus.  A.  HAV 1st MPJ B/L  IE.  Xray x 2.  Discussed surgery with patient.  She has to help at home so I told her she would do better postponing her surgery and care fior husband.  Contact me when she is ready for surgery and i will contact surgeon for her.      Assessment & Plan:

## 2015-01-10 ENCOUNTER — Other Ambulatory Visit: Payer: Self-pay | Admitting: Family

## 2015-01-14 DIAGNOSIS — J329 Chronic sinusitis, unspecified: Secondary | ICD-10-CM | POA: Diagnosis not present

## 2015-01-31 ENCOUNTER — Other Ambulatory Visit: Payer: Self-pay | Admitting: Family

## 2015-02-01 NOTE — Telephone Encounter (Signed)
Notified pt and she voices understanding. States she is at work and cannot pick up Rx until Monday. Rx placed at front desk for pick up.

## 2015-02-01 NOTE — Telephone Encounter (Signed)
UDS obtained 06/2014, no CSC signed. Pt due for repeat UDS CSC and Rx printed and forwarded to PCP for signature. Will notify pt to pick up Rx and sign CSC and complete UDS at time of Rx pick up.  Medication name:  Name from pharmacy:  ALPRAZolam (XANAX) 0.5 MG tablet ALPRAZOLAM 0.5 MG TABLET     Sig: take 1 tablet by mouth at bedtime if needed FOR SLEEP    Dispense: 30 tablet (Pharmacy requested 30)   Refills: 0   Start: 01/31/2015   Class: Normal    Requested on: 12/19/2014    Originally ordered on: 08/07/2013 12/19/2014

## 2015-02-05 ENCOUNTER — Telehealth: Payer: Self-pay

## 2015-02-05 DIAGNOSIS — Z79899 Other long term (current) drug therapy: Secondary | ICD-10-CM | POA: Diagnosis not present

## 2015-02-05 NOTE — Telephone Encounter (Signed)
Called to schedule Annual Wellness Visit with [facility].  Left a message for call back.

## 2015-02-05 NOTE — Telephone Encounter (Signed)
Appointment scheduled.

## 2015-02-12 DIAGNOSIS — Z1231 Encounter for screening mammogram for malignant neoplasm of breast: Secondary | ICD-10-CM | POA: Diagnosis not present

## 2015-02-12 DIAGNOSIS — Z779 Other contact with and (suspected) exposures hazardous to health: Secondary | ICD-10-CM | POA: Diagnosis not present

## 2015-02-12 DIAGNOSIS — Z6826 Body mass index (BMI) 26.0-26.9, adult: Secondary | ICD-10-CM | POA: Diagnosis not present

## 2015-02-12 DIAGNOSIS — N760 Acute vaginitis: Secondary | ICD-10-CM | POA: Diagnosis not present

## 2015-02-12 DIAGNOSIS — Z01419 Encounter for gynecological examination (general) (routine) without abnormal findings: Secondary | ICD-10-CM | POA: Diagnosis not present

## 2015-02-15 ENCOUNTER — Telehealth: Payer: Self-pay | Admitting: Family

## 2015-02-19 ENCOUNTER — Ambulatory Visit: Payer: Medicare Other

## 2015-02-21 NOTE — Telephone Encounter (Signed)
Opened in error

## 2015-02-28 DIAGNOSIS — L820 Inflamed seborrheic keratosis: Secondary | ICD-10-CM | POA: Diagnosis not present

## 2015-03-06 ENCOUNTER — Ambulatory Visit (INDEPENDENT_AMBULATORY_CARE_PROVIDER_SITE_OTHER): Payer: Medicare Other | Admitting: *Deleted

## 2015-03-06 ENCOUNTER — Telehealth: Payer: Self-pay | Admitting: Cardiology

## 2015-03-06 DIAGNOSIS — I495 Sick sinus syndrome: Secondary | ICD-10-CM

## 2015-03-06 NOTE — Telephone Encounter (Signed)
LMOVM reminding pt to send remote transmission.   

## 2015-03-07 NOTE — Progress Notes (Signed)
Remote pacemaker transmission.   

## 2015-03-22 LAB — CUP PACEART REMOTE DEVICE CHECK
Brady Statistic RA Percent Paced: 94.55 %
Lead Channel Impedance Value: 392 Ohm
Lead Channel Sensing Intrinsic Amplitude: 1.552 mV
Lead Channel Sensing Intrinsic Amplitude: 14.668 mV
MDC IDC MSMT BATTERY VOLTAGE: 2.99 V
MDC IDC MSMT LEADCHNL RV IMPEDANCE VALUE: 512 Ohm
MDC IDC SESS DTM: 20160907195830
MDC IDC SET LEADCHNL RA PACING AMPLITUDE: 2 V
MDC IDC SET LEADCHNL RV SENSING SENSITIVITY: 0.9 mV
Zone Setting Detection Interval: 390 ms
Zone Setting Detection Interval: 400 ms

## 2015-03-25 DIAGNOSIS — J0100 Acute maxillary sinusitis, unspecified: Secondary | ICD-10-CM | POA: Diagnosis not present

## 2015-04-05 ENCOUNTER — Encounter: Payer: Self-pay | Admitting: Cardiology

## 2015-04-06 ENCOUNTER — Other Ambulatory Visit: Payer: Self-pay | Admitting: Family

## 2015-04-09 ENCOUNTER — Encounter: Payer: Self-pay | Admitting: Cardiology

## 2015-04-09 NOTE — Telephone Encounter (Signed)
Pt was due for 3 month follow up with Melissa on 03/28/15 and is past due.  Please call pt to arrange follow up. 30 day supply of gabapentin and omeprazole were sent to pharmacy.  Thanks!

## 2015-04-11 NOTE — Telephone Encounter (Signed)
Patient scheduled for 04/19/15 (Fri)

## 2015-04-18 ENCOUNTER — Encounter: Payer: Self-pay | Admitting: Internal Medicine

## 2015-04-19 ENCOUNTER — Ambulatory Visit (INDEPENDENT_AMBULATORY_CARE_PROVIDER_SITE_OTHER): Payer: Medicare Other | Admitting: Family

## 2015-04-19 ENCOUNTER — Encounter: Payer: Self-pay | Admitting: Family

## 2015-04-19 VITALS — BP 136/79 | HR 73 | Temp 98.1°F | Ht 65.0 in | Wt 143.2 lb

## 2015-04-19 DIAGNOSIS — H9319 Tinnitus, unspecified ear: Secondary | ICD-10-CM | POA: Diagnosis not present

## 2015-04-19 DIAGNOSIS — M810 Age-related osteoporosis without current pathological fracture: Secondary | ICD-10-CM

## 2015-04-19 DIAGNOSIS — E039 Hypothyroidism, unspecified: Secondary | ICD-10-CM | POA: Diagnosis not present

## 2015-04-19 DIAGNOSIS — I10 Essential (primary) hypertension: Secondary | ICD-10-CM

## 2015-04-19 DIAGNOSIS — Z23 Encounter for immunization: Secondary | ICD-10-CM | POA: Diagnosis not present

## 2015-04-19 LAB — BASIC METABOLIC PANEL
BUN: 16 mg/dL (ref 6–23)
CHLORIDE: 104 meq/L (ref 96–112)
CO2: 30 mEq/L (ref 19–32)
CREATININE: 0.7 mg/dL (ref 0.40–1.20)
Calcium: 10.5 mg/dL (ref 8.4–10.5)
GFR: 85.85 mL/min (ref >60.00)
Glucose, Bld: 87 mg/dL (ref 70–99)
POTASSIUM: 4 meq/L (ref 3.5–5.1)
Sodium: 142 mEq/L (ref 135–145)

## 2015-04-19 LAB — TSH: TSH: 0.7 u[IU]/mL (ref 0.35–4.50)

## 2015-04-19 MED ORDER — ALPRAZOLAM 0.5 MG PO TABS: ORAL_TABLET | ORAL | Status: DC

## 2015-04-19 MED ORDER — AMLODIPINE BESYLATE 5 MG PO TABS: 5.0000 mg | ORAL_TABLET | Freq: Every day | ORAL | Status: DC

## 2015-04-19 NOTE — Progress Notes (Signed)
Pre visit review using our clinic review tool, if applicable. No additional management support is needed unless otherwise documented below in the visit note. 

## 2015-04-19 NOTE — Assessment & Plan Note (Signed)
Will arrange follow up prolia.

## 2015-04-19 NOTE — Assessment & Plan Note (Signed)
Clinically stable on synthroid, continue same, obtain follow up TSH.  

## 2015-04-19 NOTE — Patient Instructions (Signed)
Please complete lab work prior to leaving.   

## 2015-04-19 NOTE — Assessment & Plan Note (Signed)
BP stable on amlodipine, continue same, obtain follo wup bmet.

## 2015-04-19 NOTE — Progress Notes (Signed)
Subjective:    Patient ID: [name], female    DOB: 06/25/1935, 80 y.o.   MRN: [id number]  HPI  [name] is a 80 yr old female who presents today for follow up.  1) HTN- currently maintained on amlodipine 5mg  BP Readings from Last 3 Encounters:  04/19/15 136/79  01/02/15 118/80  12/26/14 104/68   2) Hypothyroid-  Maintained on synthroid.  Reports feeling well on current dose. Lab Results  Component Value Date   TSH 1.21 12/21/2014   3) Insomnia- using xanax HS prn- she rarely uses xanax.  Will sometimes use benadryl.    Tinnitus-  Reports that she has had ringing for a long time.  Denies problems with hearing.  Review of Systems  Respiratory: Negative for shortness of breath.   Cardiovascular: Negative for chest pain.   Past Medical History  Diagnosis Date  . Hypothyroidism   . Anemia     pt denies  . Osteoporosis   . Pleural effusion, bilateral     history of  . Pulmonary nodules     history of  . History of pancreatitis     pt denies  . Cystocele     history with rectocele, pt denies  . Scoliosis   . Varicose vein   . Non Hodgkin's lymphoma ([place])   . Acute bronchitis 10/09/2012  . Pacemaker 09/15/2011    Medtronic Revo  . Dysrhythmia   . Shortness of breath     with exertion  . GERD (gastroesophageal reflux disease)   . H/O hiatal hernia   . Arthritis   . Cataract     bil  . Hemorrhoid   . Pericarditis   . Atrial fibrillation [facility])     Social History   Social History  . Marital Status: Widowed    Spouse Name: N/A  . Number of Children: 3  . Years of Education: N/A   Occupational History  . self employed    Social History Main Topics  . Smoking status: Never Smoker   . Smokeless tobacco: Never Used  . Alcohol Use: Yes     Comment: 1 glass of wine occasionally  . Drug Use: No  . Sexual Activity: Not on file   Other Topics Concern  . 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  She lives alone in a 3-story home.  There is a lift chair which she does not use (placed for her husband).    Past Surgical History  Procedure Laterality Date  . Pacemaker insertion  09/15/11    MDT  . Tonsillectomy    . Breast surgery  07/30/01    biopsy x 3, bilateral  . Abdominal hysterectomy  04/29/04    partial  . Mass excision Right 10/28/2012    Procedure: Removal of mass on neck       ;  Surgeon: Adin Hector, MD;  Location: [facility];  Service: General;  Laterality: Right;  . Colonoscopy    . Eye surgery      cataracts  . Permanent pacemaker insertion N/A 09/15/2011    Procedure: PERMANENT PACEMAKER INSERTION;  Surgeon: Sanda Klein, MD;  Location: [facility] CATH LAB;  Service: Cardiovascular;  Laterality: N/A;    Family History  Problem Relation Age of Onset  . Heart disease Daughter     congenital "whole in her heart"  . Stroke Mother   . Lymphoma  Daughter     nonhodgkins  . Stroke Father   . Breast cancer Sister   . Heart disease Brother     Allergies  Allergen Reactions  . Amoxicillin Hives    Hives  . Codeine Hives    REACTION: "makes her crazy" Hives  . Demerol Hives  . Morphine And Related Hives  . Vicodin [Hydrocodone-Acetaminophen] Hives  . Sulfonamide Derivatives Rash    REACTION: Rash    Current Outpatient Prescriptions on File Prior to Visit  Medication Sig Dispense Refill  . acetaminophen (TYLENOL) 500 MG tablet Take 500 mg by mouth every 6 (six) hours as needed for pain.    [name] ALPRAZolam (XANAX) 0.5 MG tablet take 1 tablet by mouth at bedtime if needed FOR SLEEP 30 tablet 0  . amLODipine (NORVASC) 5 MG tablet take 1 tablet by mouth once daily 30 tablet 5  . aspirin 81 MG tablet Take 81 mg by mouth daily.    . Calcium Carbonate-Vitamin D (CALTRATE 600+D) 600-400 MG-UNIT per tablet Take 1 tablet by mouth 2 (two) times daily.    [name] denosumab (PROLIA) 60 MG/ML SOLN injection Inject 60 mg into the skin  every 6 (six) months. Administer in upper arm, thigh, or abdomen    . ferrous fumarate (HEMOCYTE - 106 MG FE) 325 (106 FE) MG TABS tablet Take 1 tablet by mouth.    . gabapentin (NEURONTIN) 100 MG capsule take 1 capsule by mouth three times a day 90 capsule 0  . KRILL OIL OMEGA-3 PO Take 1 capsule by mouth daily.    [name] levothyroxine (SYNTHROID, LEVOTHROID) 100 MCG tablet take 1 tablet by mouth once daily 30 tablet 5  . omeprazole (PRILOSEC) 40 MG capsule take 1 capsule by mouth once daily 30 capsule 0  . vitamin B-12 (CYANOCOBALAMIN) 100 MCG tablet Take 100 mcg by mouth daily.     No current facility-administered medications on file prior to visit.    BP 136/79 mmHg  Pulse 73  Temp(Src) 98.1 F (36.7 C) (Oral)  Ht 5\' 5"  (1.651 m)  Wt 143 lb 4 oz (64.978 kg)  BMI 23.84 kg/m2  SpO2 98%       Objective:   Physical Exam  Constitutional: She is oriented to person, place, and time. She appears well-developed and well-nourished.  HENT:  Head: Normocephalic and atraumatic.  Right Ear: Tympanic membrane and ear canal normal.  Left Ear: Tympanic membrane and ear canal normal.  Cardiovascular: Normal rate, regular rhythm and normal heart sounds.   No murmur heard. Pulmonary/Chest: Effort normal and breath sounds normal. No respiratory distress. She has no wheezes.  Musculoskeletal: She exhibits no edema.  Lymphadenopathy:    She has no cervical adenopathy.  Neurological: She is alert and oriented to person, place, and time.  Psychiatric: She has a normal mood and affect. Her behavior is normal. Judgment and thought content normal.          Assessment & Plan:  Ringing in ears- we discussed use of white noise to help drown out ringing.

## 2015-04-23 ENCOUNTER — Telehealth: Payer: Self-pay | Admitting: *Deleted

## 2015-04-23 NOTE — Telephone Encounter (Signed)
Looks like pt never received Prolia, this will be first injection. Spoke with pt and scheduled nurse visit for 04/30/15 at 9:45am. Medication in fridge. See 05/14/15 note re: Prolia coverage.

## 2015-04-23 NOTE — Telephone Encounter (Signed)
-----   Message from [name], NP sent at 04/19/2015 11:30 AM EDT ----- Could you please check on next prolia injeciton? thanks

## 2015-04-25 ENCOUNTER — Ambulatory Visit (INDEPENDENT_AMBULATORY_CARE_PROVIDER_SITE_OTHER): Payer: Medicare Other | Admitting: Cardiovascular Disease

## 2015-04-25 ENCOUNTER — Encounter: Payer: Self-pay | Admitting: Cardiovascular Disease

## 2015-04-25 VITALS — BP 128/86 | HR 96 | Ht 65.0 in | Wt 140.0 lb

## 2015-04-25 DIAGNOSIS — R001 Bradycardia, unspecified: Secondary | ICD-10-CM

## 2015-04-25 DIAGNOSIS — Z950 Presence of cardiac pacemaker: Secondary | ICD-10-CM | POA: Diagnosis not present

## 2015-04-25 NOTE — Patient Instructions (Signed)
Remote monitoring is used to monitor your Pacemaker  from home. This monitoring reduces the number of office visits required to check your device to one time per year. It allows [place] to monitor the functioning of your device to ensure it is working properly. You are scheduled for a device check from home on July 29, 2015. You may send your transmission at any time that day. If you have a wireless device, the transmission will be sent automatically. After your physician reviews your transmission, you will receive a postcard with your next transmission date.  Dr. Sallyanne Kuster recommends that you schedule a follow-up appointment in: [place]

## 2015-04-25 NOTE — Progress Notes (Signed)
Patient ID: [name], female   DOB: 1934/11/03, 80 y.o.   MRN: [id number]     Cardiology Office Note   Date:  04/25/2015   ID:  [name], DOB 11-26-1934, MRN [id number]  PCP:  [name], NP  Cardiologist:   Sanda Klein, MD   Chief Complaint  Patient presents with  . 3 MONTHS  . Dizziness  . Shortness of Breath      History of Present Illness: [name] is a 80 y.o. female who presents for  Follow-up for symptomatic sinus bradycardia and pacemaker therapy. She has occasional palpitations that are not associated with dizziness, syncope , shortness of breath or chest pain. She has an occasional tingling sensation in the left shoulder around her pacemaker, but she denies fever/chills, swelling or redness at the site or frank pain.   Recent remote pacemaker check (Medtronic Revo) shows no episodes of atrial fibrillation. This was recorded in the few weeks following her initial device implantation, which was complicated by a pericardial effusion and acute pericarditis. Atrial fibrillation was not seen following those first few weeks.  She has normal left ventricular systolic function. Her most recent echo was performed in June 2016. There was no evidence of diastolic dysfunction or any meaningful pericardial effusion.   her device is programmed AAIR since it appeared to that she had palpitations during ventricular lead threshold testing. She has over 90% atrial pacing with fair heart rate histogram distribution  Over the last 6 months she has been in remission from non-Hodgkin's lymphoma.    Past Medical History  Diagnosis Date  . Hypothyroidism   . Anemia     pt denies  . Osteoporosis   . Pleural effusion, bilateral     history of  . Pulmonary nodules     history of  . History of pancreatitis     pt denies  . Cystocele     history with rectocele, pt denies  . Scoliosis   . Varicose vein   . Non Hodgkin's lymphoma ([place])   . Acute  bronchitis 10/09/2012  . Pacemaker 09/15/2011    Medtronic Revo  . Dysrhythmia   . Shortness of breath     with exertion  . GERD (gastroesophageal reflux disease)   . H/O hiatal hernia   . Arthritis   . Cataract     bil  . Hemorrhoid   . Pericarditis   . Atrial fibrillation [facility])     Past Surgical History  Procedure Laterality Date  . Pacemaker insertion  09/15/11    MDT  . Tonsillectomy    . Breast surgery  07/30/01    biopsy x 3, bilateral  . Abdominal hysterectomy  04/29/04    partial  . Mass excision Right 10/28/2012    Procedure: Removal of mass on neck       ;  Surgeon: Adin Hector, MD;  Location: [place];  Service: General;  Laterality: Right;  . Colonoscopy    . Eye surgery      cataracts  . Permanent pacemaker insertion N/A 09/15/2011    Procedure: PERMANENT PACEMAKER INSERTION;  Surgeon: Sanda Klein, MD;  Location: [place] CATH LAB;  Service: Cardiovascular;  Laterality: N/A;     Current Outpatient Prescriptions  Medication Sig Dispense Refill  . acetaminophen (TYLENOL) 500 MG tablet Take 500 mg by mouth every 6 (six) hours as needed for pain.    [name] ALPRAZolam (XANAX) 0.5 MG tablet take 1 tablet by mouth at bedtime if  needed FOR SLEEP 30 tablet 0  . amLODipine (NORVASC) 5 MG tablet Take 1 tablet (5 mg total) by mouth daily. 90 tablet 1  . aspirin 81 MG tablet Take 81 mg by mouth daily.    . Calcium Carbonate-Vitamin D (CALTRATE 600+D) 600-400 MG-UNIT per tablet Take 1 tablet by mouth 2 (two) times daily.    [name] denosumab (PROLIA) 60 MG/ML SOLN injection Inject 60 mg into the skin every 6 (six) months. Administer in upper arm, thigh, or abdomen    . ferrous fumarate (HEMOCYTE - 106 MG FE) 325 (106 FE) MG TABS tablet Take 1 tablet by mouth.    . gabapentin (NEURONTIN) 100 MG capsule take 1 capsule by mouth three times a day 90 capsule 0  . KRILL OIL OMEGA-3 PO Take 1 capsule by mouth daily.    [name] levothyroxine (SYNTHROID, LEVOTHROID) 100 MCG tablet take 1 tablet by mouth once  daily 30 tablet 5  . omeprazole (PRILOSEC) 40 MG capsule take 1 capsule by mouth once daily 30 capsule 0  . vitamin B-12 (CYANOCOBALAMIN) 100 MCG tablet Take 100 mcg by mouth daily.     No current facility-administered medications for this visit.    Allergies:   Amoxicillin; Codeine; Demerol; Morphine and related; Vicodin; and Sulfonamide derivatives    Social History:  The patient  reports that she has never smoked. She has never used smokeless tobacco. She reports that she drinks alcohol. She reports that she does not use illicit drugs.   Family History:  The patient's family history includes Breast cancer in her sister; Heart disease in her brother and daughter; Lymphoma in her daughter; Stroke in her father and mother.    ROS:  Please see the history of present illness.    Otherwise, review of systems positive for none.   All other systems are reviewed and negative.    PHYSICAL EXAM: VS:  BP 128/86 mmHg  Pulse 96  Ht 5\' 5"  (1.651 m)  Wt 140 lb (63.504 kg)  BMI 23.30 kg/m2 , BMI Body mass index is 23.3 kg/(m^2).  General: Alert, oriented x3, no distress Head: no evidence of trauma, PERRL, EOMI, no exophtalmos or lid lag, no myxedema, no xanthelasma; normal ears, nose and oropharynx Neck: normal jugular venous pulsations and no hepatojugular reflux; brisk carotid pulses without delay and no carotid bruits Chest: clear to auscultation, no signs of consolidation by percussion or palpation, normal fremitus, symmetrical and full respiratory excursions,  Healthy left subclavian pacemaker site Cardiovascular: normal position and quality of the apical impulse, regular rhythm, normal first and second heart sounds, no murmurs, rubs or gallops Abdomen: no tenderness or distention, no masses by palpation, no abnormal pulsatility or arterial bruits, normal bowel sounds, no hepatosplenomegaly Extremities: no clubbing, cyanosis or edema; 2+ radial, ulnar and brachial pulses bilaterally; 2+ right  femoral, posterior tibial and dorsalis pedis pulses; 2+ left femoral, posterior tibial and dorsalis pedis pulses; no subclavian or femoral bruits Neurological: grossly nonfocal Psych: euthymic mood, full affect   EKG:  EKG is not ordered today.   Recent Labs: 11/28/2014: ALT 20; HGB 14.6; Platelets 312 04/19/2015: BUN 16; Creatinine, Ser 0.70; Potassium 4.0; Sodium 142; TSH 0.70    Lipid Panel    Component Value Date/Time   CHOL 238* 10/06/2012 1202   TRIG 199* 10/06/2012 1202   HDL 53 10/06/2012 1202   CHOLHDL 4.5 10/06/2012 1202   VLDL 40 10/06/2012 1202   LDLCALC 145* 10/06/2012 1202      Wt Readings  from Last 3 Encounters:  04/25/15 140 lb (63.504 kg)  04/19/15 143 lb 4 oz (64.978 kg)  01/02/15 142 lb 12.8 oz (64.774 kg)     ASSESSMENT AND PLAN:  1.  Sinus bradycardia with normal dual-chamber permanent pacemaker function. The device is programmed as an atrial pacemaker to avoid unpleasant palpitations during  Infrequently necessary ventricular pacing.  2.  Non-Hodgkin's lymphoma in remission  3.  Systemic hypertension well controlled  4.  History of post pacemaker implantation acute pericarditis and atrial fibrillation, resolved  5.   Symptomatic PVCs  6. Tinnitus, pattern suggests that the underlying causes probably hearing loss. Recommend ENT/audiology evaluation. She could try to stop the aspirin for a few weeks to see if the symptoms improve but I strongly doubt that the current low dose of aspirin is responsible for her symptoms    Current medicines are reviewed at length with the patient today.  The patient does not have concerns regarding medicines.  The following changes have been made:  no change  Labs/ tests ordered today include:  No orders of the defined types were placed in this encounter.     Patient Instructions  Remote monitoring is used to monitor your Pacemaker  from home. This monitoring reduces the number of office visits required to  check your device to one time per year. It allows [place] to monitor the functioning of your device to ensure it is working properly. You are scheduled for a device check from home on July 29, 2015. You may send your transmission at any time that day. If you have a wireless device, the transmission will be sent automatically. After your physician reviews your transmission, you will receive a postcard with your next transmission date.  Dr. Sallyanne Kuster recommends that you schedule a follow-up appointment in: ONE YEAR        SignedSanda Klein, MD  04/25/2015 10:08 PM    Sanda Klein, MD, [facility] [phone] office [phone] pager

## 2015-04-30 ENCOUNTER — Ambulatory Visit (INDEPENDENT_AMBULATORY_CARE_PROVIDER_SITE_OTHER): Payer: Medicare Other

## 2015-04-30 DIAGNOSIS — M810 Age-related osteoporosis without current pathological fracture: Secondary | ICD-10-CM | POA: Diagnosis not present

## 2015-04-30 MED ORDER — DENOSUMAB 60 MG/ML ~~LOC~~ SOLN
60.0000 mg | Freq: Once | SUBCUTANEOUS | Status: AC
  Administered 2015-04-30: 60 mg via SUBCUTANEOUS

## 2015-05-06 DIAGNOSIS — M25511 Pain in right shoulder: Secondary | ICD-10-CM | POA: Diagnosis not present

## 2015-05-06 DIAGNOSIS — M542 Cervicalgia: Secondary | ICD-10-CM | POA: Diagnosis not present

## 2015-05-13 ENCOUNTER — Encounter: Payer: Self-pay | Admitting: Family

## 2015-05-13 ENCOUNTER — Ambulatory Visit (INDEPENDENT_AMBULATORY_CARE_PROVIDER_SITE_OTHER): Payer: Medicare Other | Admitting: Family

## 2015-05-13 VITALS — BP 115/70 | HR 70 | Temp 98.1°F | Resp 16 | Ht 65.0 in | Wt 145.2 lb

## 2015-05-13 DIAGNOSIS — H2513 Age-related nuclear cataract, bilateral: Secondary | ICD-10-CM | POA: Diagnosis not present

## 2015-05-13 DIAGNOSIS — S134XXA Sprain of ligaments of cervical spine, initial encounter: Secondary | ICD-10-CM

## 2015-05-13 MED ORDER — CYCLOBENZAPRINE HCL 5 MG PO TABS: 5.0000 mg | ORAL_TABLET | Freq: Three times a day (TID) | ORAL | Status: DC | PRN

## 2015-05-13 MED ORDER — TRAMADOL HCL 50 MG PO TABS: 50.0000 mg | ORAL_TABLET | Freq: Three times a day (TID) | ORAL | Status: DC

## 2015-05-13 NOTE — Patient Instructions (Addendum)
Continue tramadol as needed.  When you complete tramadol, please switch to tylenol as needed. You may use flexeril as needed.  We will request your records from [facility] and let you know if we have any further recommendations. Call if symptoms worsen or if not improved in 3 weeks.
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
## 2015-05-13 NOTE — Progress Notes (Signed)
Pre visit review using our clinic review tool, if applicable. No additional management support is needed unless otherwise documented below in the visit note. 

## 2015-05-15 NOTE — Progress Notes (Signed)
Addendum: reviewed records from [facility]. Pt had C spine x ray which noted DDD C5-6 and C6-7 with foraminal narrowing bilaterally. R shoulder x ray noted mild degenerative change with no acute abnormalities.

## 2015-05-28 ENCOUNTER — Telehealth: Payer: Self-pay | Admitting: Family

## 2015-05-28 NOTE — Telephone Encounter (Signed)
Caller name: Self   Can be reached: [phone]  Pharmacy:  [facility], [place] [place] [phone] (Phone) [phone] (Fax)         Reason for call: Wants to know if she can get a refill on traMADol (ULTRAM) 50 MG tablet [id number] or if she needs to come into the office. States she is still in pain

## 2015-05-29 MED ORDER — TRAMADOL HCL 50 MG PO TABS: 50.0000 mg | ORAL_TABLET | Freq: Three times a day (TID) | ORAL | Status: DC

## 2015-05-29 NOTE — Telephone Encounter (Signed)
Yes, see rx.

## 2015-05-29 NOTE — Telephone Encounter (Signed)
Rx faxed to pharmacy, notified pt. 

## 2015-05-31 ENCOUNTER — Other Ambulatory Visit: Payer: Self-pay | Admitting: Family

## 2015-06-04 ENCOUNTER — Telehealth: Payer: Self-pay | Admitting: Family

## 2015-06-04 DIAGNOSIS — M5489 Other dorsalgia: Secondary | ICD-10-CM

## 2015-06-04 NOTE — Telephone Encounter (Signed)
Caller name: Self   Can be reached: [phone]   Reason for call: Patient requesting x-Ray of back because the pain is getting worse.

## 2015-06-04 NOTE — Telephone Encounter (Signed)
Notified pt. 

## 2015-06-04 NOTE — Telephone Encounter (Signed)
Orders have been placed.

## 2015-06-05 ENCOUNTER — Ambulatory Visit (HOSPITAL_BASED_OUTPATIENT_CLINIC_OR_DEPARTMENT_OTHER)
Admission: RE | Admit: 2015-06-05 | Discharge: 2015-06-05 | Disposition: A | Discharge status: Home / Self Care | Payer: Medicare Other | Source: Ambulatory Visit | Attending: Family | Admitting: Family

## 2015-06-05 DIAGNOSIS — M545 Low back pain: Secondary | ICD-10-CM | POA: Diagnosis not present

## 2015-06-05 DIAGNOSIS — M4186 Other forms of scoliosis, lumbar region: Secondary | ICD-10-CM | POA: Diagnosis not present

## 2015-06-05 DIAGNOSIS — S3992XA Unspecified injury of lower back, initial encounter: Secondary | ICD-10-CM | POA: Diagnosis not present

## 2015-06-05 DIAGNOSIS — M546 Pain in thoracic spine: Secondary | ICD-10-CM | POA: Insufficient documentation

## 2015-06-05 DIAGNOSIS — Z8572 Personal history of non-Hodgkin lymphomas: Secondary | ICD-10-CM | POA: Insufficient documentation

## 2015-06-05 DIAGNOSIS — M47896 Other spondylosis, lumbar region: Secondary | ICD-10-CM | POA: Insufficient documentation

## 2015-06-05 DIAGNOSIS — M4184 Other forms of scoliosis, thoracic region: Secondary | ICD-10-CM | POA: Insufficient documentation

## 2015-06-05 DIAGNOSIS — M5489 Other dorsalgia: Secondary | ICD-10-CM

## 2015-06-06 ENCOUNTER — Other Ambulatory Visit (HOSPITAL_BASED_OUTPATIENT_CLINIC_OR_DEPARTMENT_OTHER): Payer: Medicare Other

## 2015-06-06 ENCOUNTER — Telehealth: Payer: Self-pay | Admitting: Internal Medicine

## 2015-06-06 ENCOUNTER — Encounter: Payer: Self-pay | Admitting: Internal Medicine

## 2015-06-06 ENCOUNTER — Ambulatory Visit (HOSPITAL_BASED_OUTPATIENT_CLINIC_OR_DEPARTMENT_OTHER): Payer: Medicare Other | Admitting: Internal Medicine

## 2015-06-06 VITALS — BP 132/87 | HR 79 | Temp 97.5°F | Resp 18 | Ht 65.0 in | Wt 141.6 lb

## 2015-06-06 DIAGNOSIS — C8590 Non-Hodgkin lymphoma, unspecified, unspecified site: Secondary | ICD-10-CM

## 2015-06-06 DIAGNOSIS — C8220 Follicular lymphoma grade III, unspecified, unspecified site: Secondary | ICD-10-CM | POA: Diagnosis not present

## 2015-06-06 DIAGNOSIS — C8221 Follicular lymphoma grade III, unspecified, lymph nodes of head, face, and neck: Secondary | ICD-10-CM | POA: Insufficient documentation

## 2015-06-06 LAB — CBC WITH DIFFERENTIAL/PLATELET
BASO%: 1.6 % (ref 0.0–2.0)
Basophils Absolute: 0.1 10*3/uL (ref 0.0–0.1)
EOS%: 7.1 % — ABNORMAL HIGH (ref 0.0–7.0)
Eosinophils Absolute: 0.5 10*3/uL (ref 0.0–0.5)
HCT: 43.4 % (ref 34.8–46.6)
HGB: 14.6 g/dL (ref 11.6–15.9)
LYMPH%: 17.8 % (ref 14.0–49.7)
MCH: 31.7 pg (ref 25.1–34.0)
MCHC: 33.6 g/dL (ref 31.5–36.0)
MCV: 94.5 fL (ref 79.5–101.0)
MONO#: 0.8 10*3/uL (ref 0.1–0.9)
MONO%: 9.7 % (ref 0.0–14.0)
NEUT%: 63.8 % (ref 38.4–76.8)
NEUTROS ABS: 4.9 10*3/uL (ref 1.5–6.5)
PLATELETS: 297 10*3/uL (ref 145–400)
RBC: 4.6 10*6/uL (ref 3.70–5.45)
RDW: 14.1 % (ref 11.2–14.5)
WBC: 7.7 10*3/uL (ref 3.9–10.3)
lymph#: 1.4 10*3/uL (ref 0.9–3.3)

## 2015-06-06 LAB — COMPREHENSIVE METABOLIC PANEL
ALT: 14 U/L (ref 0–55)
ANION GAP: 12 meq/L — AB (ref 3–11)
AST: 17 U/L (ref 5–34)
Albumin: 4.1 g/dL (ref 3.5–5.0)
Alkaline Phosphatase: 77 U/L (ref 40–150)
BUN: 13.9 mg/dL (ref 7.0–26.0)
CHLORIDE: 108 meq/L (ref 98–109)
CO2: 24 meq/L (ref 22–29)
Calcium: 9.4 mg/dL (ref 8.4–10.4)
Creatinine: 0.8 mg/dL (ref 0.6–1.1)
EGFR: 72 mL/min/{1.73_m2} — AB (ref >90)
Glucose: 80 mg/dl (ref 70–140)
Potassium: 4 mEq/L (ref 3.5–5.1)
Sodium: 144 mEq/L (ref 136–145)
Total Bilirubin: 0.49 mg/dL (ref 0.20–1.20)
Total Protein: 7.1 g/dL (ref 6.4–8.3)

## 2015-06-06 LAB — LACTATE DEHYDROGENASE: LDH: 272 U/L — AB (ref 125–245)
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
## 2015-06-06 NOTE — Telephone Encounter (Signed)
s.w. pt AND ADVISED ON JUNE APPT....Marland KitchenPT OK AND AWARE OF D.T

## 2015-06-07 ENCOUNTER — Telehealth: Payer: Self-pay | Admitting: *Deleted

## 2015-06-07 NOTE — Telephone Encounter (Signed)
-----   Message from [name], NP sent at 06/06/2015  9:48 AM EST ----- X rays show scoliosis, degenerative changes of spine, no fracture.

## 2015-06-07 NOTE — Telephone Encounter (Signed)
Notified pt. She states she used to see a specialist in the past and will call us back Monday with his contact information as pt would like him to compare our xrays with the ones he did in the past. Also states that she completed the Tramadol Rx from 05/19/15 and is requesting refill. States tramadol helped but did not take pain completely away. Pt also wants to know if you think her pain will get better over time based on the xray results or will she need pain medication ongoing? Pt states she doesn't want to take pain medication consistently and wants to discuss other options with her specialist.  Please advise?

## 2015-06-08 NOTE — Telephone Encounter (Signed)
Ok to send tramadol refill please, I hope her pain will improve with time but I think it is a good idea for her to see the specialist.

## 2015-06-10 MED ORDER — TRAMADOL HCL 50 MG PO TABS: 50.0000 mg | ORAL_TABLET | Freq: Three times a day (TID) | ORAL | Status: DC

## 2015-06-10 NOTE — Telephone Encounter (Signed)
Rx called to pharmacy voicemail. Notified pt and she voices understanding.

## 2015-06-13 DIAGNOSIS — J0100 Acute maxillary sinusitis, unspecified: Secondary | ICD-10-CM | POA: Diagnosis not present

## 2015-06-13 DIAGNOSIS — J209 Acute bronchitis, unspecified: Secondary | ICD-10-CM | POA: Diagnosis not present

## 2015-06-18 ENCOUNTER — Encounter: Payer: Self-pay | Admitting: Physician Assistant

## 2015-06-18 ENCOUNTER — Ambulatory Visit (INDEPENDENT_AMBULATORY_CARE_PROVIDER_SITE_OTHER): Payer: Medicare Other | Admitting: Physician Assistant

## 2015-06-18 VITALS — BP 146/88 | HR 84 | Temp 98.1°F | Ht 65.0 in | Wt 144.0 lb

## 2015-06-18 DIAGNOSIS — B9689 Other specified bacterial agents as the cause of diseases classified elsewhere: Secondary | ICD-10-CM | POA: Insufficient documentation

## 2015-06-18 DIAGNOSIS — J208 Acute bronchitis due to other specified organisms: Principal | ICD-10-CM

## 2015-06-18 DIAGNOSIS — J00 Acute nasopharyngitis [common cold]: Secondary | ICD-10-CM

## 2015-06-18 MED ORDER — DOXYCYCLINE HYCLATE 100 MG PO CAPS: 100.0000 mg | ORAL_CAPSULE | Freq: Two times a day (BID) | ORAL | Status: DC

## 2015-06-18 MED ORDER — BENZONATATE 100 MG PO CAPS: 100.0000 mg | ORAL_CAPSULE | Freq: Two times a day (BID) | ORAL | Status: DC | PRN

## 2015-06-18 MED ORDER — AZITHROMYCIN 250 MG PO TABS: ORAL_TABLET | ORAL | Status: DC

## 2015-06-18 NOTE — Patient Instructions (Signed)
Take antibiotic (Doxycycline) as directed.  Increase fluids.  Get plenty of rest. Use Mucinex for congestion. Tessalon per orders. Take a daily probiotic (I recommend Align or Culturelle, but even Activia Yogurt may be beneficial).  A humidifier placed in the bedroom may offer some relief for a dry, scratchy throat of nasal irritation.  Read information below on acute bronchitis. Please call or return to clinic if symptoms are not improving.  Acute Bronchitis Bronchitis is when the airways that extend from the windpipe into the lungs get red, puffy, and painful (inflamed). Bronchitis often causes thick spit (mucus) to develop. This leads to a cough. A cough is the most common symptom of bronchitis. In acute bronchitis, the condition usually begins suddenly and goes away over time (usually in 2 weeks). Smoking, allergies, and asthma can make bronchitis worse. Repeated episodes of bronchitis may cause more lung problems.  HOME CARE  Rest.  Drink enough fluids to keep your pee (urine) clear or pale yellow (unless you need to limit fluids as told by your doctor).  Only take over-the-counter or prescription medicines as told by your doctor.  Avoid smoking and secondhand smoke. These can make bronchitis worse. If you are a smoker, think about using nicotine gum or skin patches. Quitting smoking will help your lungs heal faster.  Reduce the chance of getting bronchitis again by:  Washing your hands often.  Avoiding people with cold symptoms.  Trying not to touch your hands to your mouth, nose, or eyes.  Follow up with your doctor as told.  GET HELP IF: Your symptoms do not improve after 1 week of treatment. Symptoms include:  Cough.  Fever.  Coughing up thick spit.  Body aches.  Chest congestion.  Chills.  Shortness of breath.  Sore throat.  GET HELP RIGHT AWAY IF:   You have an increased fever.  You have chills.  You have severe shortness of breath.  You have bloody  thick spit (sputum).  You throw up (vomit) often.  You lose too much body fluid (dehydration).  You have a severe headache.  You faint.  MAKE SURE YOU:   Understand these instructions.  Will watch your condition.  Will get help right away if you are not doing well or get worse. Document Released: 12/02/2007 Document Revised: 02/15/2013 Document Reviewed: 12/06/2012 [facility] Patient Information 2015 [place], [place]. This information is not intended to replace advice given to you by your health care provider. Make sure you discuss any questions you have with your health care provider.

## 2015-06-18 NOTE — Progress Notes (Signed)
Pre visit review using our clinic review tool, if applicable. No additional management support is needed unless otherwise documented below in the visit note. 

## 2015-06-18 NOTE — Assessment & Plan Note (Signed)
Rx Doxycycline.  Increase fluids.  Rest.  Saline nasal spray.  Probiotic.  Mucinex as directed.  Humidifier in bedroom. Tessalon per orders.  Call or return to clinic if symptoms are not improving.  

## 2015-06-18 NOTE — Progress Notes (Signed)
Patient presents to clinic today c/o sinus pressure, sinus pain, cough and chest congestion off and on for 3 weeks. Is unsure of fever. Denies chest pain or SOB. Cough is sometimes constant. Denies recent travel or sick contact. Has taken multiple OTC medications with little relief in symptoms.    Past Medical History  Diagnosis Date  . Hypothyroidism   . Anemia     pt denies  . Osteoporosis   . Pleural effusion, bilateral     history of  . Pulmonary nodules     history of  . History of pancreatitis     pt denies  . Cystocele     history with rectocele, pt denies  . Scoliosis   . Varicose vein   . Non Hodgkin's lymphoma ([facility])   . Acute bronchitis 10/09/2012  . Pacemaker 09/15/2011    Medtronic Revo  . Dysrhythmia   . Shortness of breath     with exertion  . GERD (gastroesophageal reflux disease)   . H/O hiatal hernia   . Arthritis   . Cataract     bil  . Hemorrhoid   . Pericarditis   . Atrial fibrillation [facility])     Current Outpatient Prescriptions on File Prior to Visit  Medication Sig Dispense Refill  . acetaminophen (TYLENOL) 500 MG tablet Take 500 mg by mouth every 6 (six) hours as needed for pain.    [name] ALPRAZolam (XANAX) 0.5 MG tablet take 1 tablet by mouth at bedtime if needed FOR SLEEP 30 tablet 0  . amLODipine (NORVASC) 5 MG tablet Take 1 tablet (5 mg total) by mouth daily. 90 tablet 1  . aspirin 81 MG tablet Take 81 mg by mouth daily.    . Calcium Carbonate-Vitamin D (CALTRATE 600+D) 600-400 MG-UNIT per tablet Take 1 tablet by mouth 2 (two) times daily.    [name] denosumab (PROLIA) 60 MG/ML SOLN injection Inject 60 mg into the skin every 6 (six) months. Administer in upper arm, thigh, or abdomen    . ferrous fumarate (HEMOCYTE - 106 MG FE) 325 (106 FE) MG TABS tablet Take 1 tablet by mouth.    . gabapentin (NEURONTIN) 100 MG capsule take 1 capsule by mouth three times a day 90 capsule 0  . KRILL OIL OMEGA-3 PO Take 1 capsule by mouth daily.    [name] levothyroxine  (SYNTHROID, LEVOTHROID) 100 MCG tablet take 1 tablet by mouth once daily 30 tablet 5  . omeprazole (PRILOSEC) 40 MG capsule take 1 capsule by mouth once daily 30 capsule 5  . traMADol (ULTRAM) 50 MG tablet Take 1 tablet (50 mg total) by mouth 3 (three) times daily. 20 tablet 0  . vitamin B-12 (CYANOCOBALAMIN) 100 MCG tablet Take 100 mcg by mouth daily.    . cyclobenzaprine (FLEXERIL) 5 MG tablet Take 1 tablet (5 mg total) by mouth 3 (three) times daily as needed for muscle spasms. (Patient not taking: Reported on 06/18/2015) 14 tablet 0   No current facility-administered medications on file prior to visit.    Allergies  Allergen Reactions  . Amoxicillin Hives    Hives  . Codeine Hives    REACTION: "makes her crazy" Hives  . Demerol Hives  . Morphine And Related Hives  . Vicodin [Hydrocodone-Acetaminophen] Hives  . Sulfonamide Derivatives Rash    REACTION: Rash    Family History  Problem Relation Age of Onset  . Heart disease Daughter     congenital "whole in her heart"  . Stroke Mother   .  Lymphoma Daughter     nonhodgkins  . Stroke Father   . Breast cancer Sister   . Heart disease Brother     Social History   Social History  . Marital Status: Widowed    Spouse Name: N/A  . Number of Children: 3  . Years of Education: N/A   Occupational History  . self employed    Social History Main Topics  . Smoking status: Never Smoker   . Smokeless tobacco: Never Used  . Alcohol Use: Yes     Comment: 1 glass of wine occasionally  . Drug Use: No  . Sexual Activity: Not Asked   Other Topics Concern  . 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  She lives alone in a 3-story home.  There is a lift chair which she does not use (placed for her husband).    Review of Systems - See HPI.  All other ROS are negative.  BP 146/88 mmHg  Pulse 84  Temp(Src) 98.1 F (36.7 C) (Oral)  Ht '5\' 5"'$   (1.651 m)  Wt 144 lb (65.318 kg)  BMI 23.96 kg/m2  SpO2 96%  Physical Exam  Constitutional: She is oriented to person, place, and time and well-developed, well-nourished, and in no distress.  HENT:  Head: Normocephalic and atraumatic.  Right Ear: External ear normal.  Left Ear: External ear normal.  Nose: Nose normal.  Mouth/Throat: Oropharynx is clear and moist. No oropharyngeal exudate.  TM within normal limits bilateral.  Eyes: Conjunctivae are normal.  Cardiovascular: Normal rate, regular rhythm, normal heart sounds and intact distal pulses.   Pulmonary/Chest: Effort normal and breath sounds normal. No respiratory distress. She has no wheezes. She has no rales. She exhibits no tenderness.  Neurological: She is alert and oriented to person, place, and time.  Skin: Skin is warm and dry. No rash noted.  Psychiatric: Affect normal.  Vitals reviewed.   Recent Results (from the past 2160 hour(s))  Basic metabolic panel     Status: None   Collection Time: 04/19/15 11:40 AM  Result Value Ref Range   Sodium 142 135 - 145 mEq/L   Potassium 4.0 3.5 - 5.1 mEq/L   Chloride 104 96 - 112 mEq/L   CO2 30 19 - 32 mEq/L   Glucose, Bld 87 70 - 99 mg/dL   BUN 16 6 - 23 mg/dL   Creatinine, Ser 0.70 0.40 - 1.20 mg/dL   Calcium 10.5 8.4 - 10.5 mg/dL   GFR 85.85 >60.00 mL/min  TSH     Status: None   Collection Time: 04/19/15 11:40 AM  Result Value Ref Range   TSH 0.70 0.35 - 4.50 uIU/mL  CBC with Differential     Status: Abnormal   Collection Time: 06/06/15  9:56 AM  Result Value Ref Range   WBC 7.7 3.9 - 10.3 10e3/uL   NEUT# 4.9 1.5 - 6.5 10e3/uL   HGB 14.6 11.6 - 15.9 g/dL   HCT 43.4 34.8 - 46.6 %   Platelets 297 145 - 400 10e3/uL   MCV 94.5 79.5 - 101.0 fL   MCH 31.7 25.1 - 34.0 pg   MCHC 33.6 31.5 - 36.0 g/dL   RBC 4.60 3.70 - 5.45 10e6/uL   RDW 14.1 11.2 - 14.5 %   lymph# 1.4 0.9 - 3.3 10e3/uL   MONO# 0.8 0.1 - 0.9 10e3/uL   Eosinophils Absolute 0.5 0.0 - 0.5 10e3/uL  Basophils Absolute 0.1 0.0 - 0.1 10e3/uL   NEUT% 63.8 38.4 - 76.8 %   LYMPH% 17.8 14.0 - 49.7 %   MONO% 9.7 0.0 - 14.0 %   EOS% 7.1 (H) 0.0 - 7.0 %   BASO% 1.6 0.0 - 2.0 %  Lactate dehydrogenase (LDH) - [facility]     Status: Abnormal   Collection Time: 06/06/15  9:56 AM  Result Value Ref Range   LDH 272 (H) 125 - 245 U/L  Comprehensive metabolic panel     Status: Abnormal   Collection Time: 06/06/15  9:56 AM  Result Value Ref Range   Sodium 144 136 - 145 mEq/L   Potassium 4.0 3.5 - 5.1 mEq/L   Chloride 108 98 - 109 mEq/L   CO2 24 22 - 29 mEq/L   Glucose 80 70 - 140 mg/dl    Comment: Glucose reference range is for nonfasting patients. Fasting glucose reference range is 70- 100.   BUN 13.9 7.0 - 26.0 mg/dL   Creatinine 0.8 0.6 - 1.1 mg/dL   Total Bilirubin 0.49 0.20 - 1.20 mg/dL   Alkaline Phosphatase 77 40 - 150 U/L   AST 17 5 - 34 U/L   ALT 14 0 - 55 U/L   Total Protein 7.1 6.4 - 8.3 g/dL   Albumin 4.1 3.5 - 5.0 g/dL   Calcium 9.4 8.4 - 10.4 mg/dL   Anion Gap 12 (H) 3 - 11 mEq/L   EGFR 72 (L) >90 ml/min/1.73 m2    Comment: eGFR is calculated using the CKD-EPI Creatinine Equation (2009)    Assessment/Plan: Acute bacterial bronchitis Rx Doxycycline.  Increase fluids.  Rest.  Saline nasal spray.  Probiotic.  Mucinex as directed.  Humidifier in bedroom. Tessalon per orders.  Call or return to clinic if symptoms are not improving.

## 2015-07-19 ENCOUNTER — Other Ambulatory Visit: Payer: Self-pay | Admitting: Family

## 2015-07-22 DIAGNOSIS — R87612 Low grade squamous intraepithelial lesion on cytologic smear of cervix (LGSIL): Secondary | ICD-10-CM | POA: Diagnosis not present

## 2015-07-22 DIAGNOSIS — R87610 Atypical squamous cells of undetermined significance on cytologic smear of cervix (ASC-US): Secondary | ICD-10-CM | POA: Diagnosis not present

## 2015-07-23 ENCOUNTER — Other Ambulatory Visit: Payer: Self-pay | Admitting: Family

## 2015-08-07 DIAGNOSIS — M4126 Other idiopathic scoliosis, lumbar region: Secondary | ICD-10-CM | POA: Diagnosis not present

## 2015-08-07 DIAGNOSIS — M47816 Spondylosis without myelopathy or radiculopathy, lumbar region: Secondary | ICD-10-CM | POA: Diagnosis not present

## 2015-08-07 DIAGNOSIS — M545 Low back pain: Secondary | ICD-10-CM | POA: Diagnosis not present

## 2015-08-15 DIAGNOSIS — M546 Pain in thoracic spine: Secondary | ICD-10-CM | POA: Diagnosis not present

## 2015-08-15 DIAGNOSIS — M545 Low back pain: Secondary | ICD-10-CM | POA: Diagnosis not present

## 2015-08-20 DIAGNOSIS — J111 Influenza due to unidentified influenza virus with other respiratory manifestations: Secondary | ICD-10-CM | POA: Diagnosis not present

## 2015-08-20 DIAGNOSIS — J0100 Acute maxillary sinusitis, unspecified: Secondary | ICD-10-CM | POA: Diagnosis not present

## 2015-08-27 DIAGNOSIS — M546 Pain in thoracic spine: Secondary | ICD-10-CM | POA: Diagnosis not present

## 2015-08-27 DIAGNOSIS — M545 Low back pain: Secondary | ICD-10-CM | POA: Diagnosis not present

## 2015-08-29 DIAGNOSIS — M546 Pain in thoracic spine: Secondary | ICD-10-CM | POA: Diagnosis not present

## 2015-08-29 DIAGNOSIS — M545 Low back pain: Secondary | ICD-10-CM | POA: Diagnosis not present

## 2015-09-03 DIAGNOSIS — M545 Low back pain: Secondary | ICD-10-CM | POA: Diagnosis not present

## 2015-09-03 DIAGNOSIS — M546 Pain in thoracic spine: Secondary | ICD-10-CM | POA: Diagnosis not present

## 2015-09-04 DIAGNOSIS — M545 Low back pain: Secondary | ICD-10-CM | POA: Diagnosis not present

## 2015-09-04 DIAGNOSIS — M47816 Spondylosis without myelopathy or radiculopathy, lumbar region: Secondary | ICD-10-CM | POA: Diagnosis not present

## 2015-09-04 DIAGNOSIS — M4126 Other idiopathic scoliosis, lumbar region: Secondary | ICD-10-CM | POA: Diagnosis not present

## 2015-09-05 DIAGNOSIS — M545 Low back pain: Secondary | ICD-10-CM | POA: Diagnosis not present

## 2015-09-05 DIAGNOSIS — M546 Pain in thoracic spine: Secondary | ICD-10-CM | POA: Diagnosis not present

## 2015-09-10 DIAGNOSIS — M545 Low back pain: Secondary | ICD-10-CM | POA: Diagnosis not present

## 2015-09-10 DIAGNOSIS — M546 Pain in thoracic spine: Secondary | ICD-10-CM | POA: Diagnosis not present

## 2015-09-12 DIAGNOSIS — M545 Low back pain: Secondary | ICD-10-CM | POA: Diagnosis not present

## 2015-09-12 DIAGNOSIS — M546 Pain in thoracic spine: Secondary | ICD-10-CM | POA: Diagnosis not present

## 2015-09-17 DIAGNOSIS — M545 Low back pain: Secondary | ICD-10-CM | POA: Diagnosis not present

## 2015-09-17 DIAGNOSIS — M546 Pain in thoracic spine: Secondary | ICD-10-CM | POA: Diagnosis not present

## 2015-09-18 ENCOUNTER — Telehealth: Payer: Self-pay | Admitting: *Deleted

## 2015-09-18 MED ORDER — ALPRAZOLAM 0.5 MG PO TABS: ORAL_TABLET | ORAL | Status: DC

## 2015-09-18 NOTE — Telephone Encounter (Signed)
Received request from [facility] for alprazolam.  Per verbal from PCP, ok to call in #30 tablets. Pt will need UDS at next visit.

## 2015-09-19 DIAGNOSIS — M546 Pain in thoracic spine: Secondary | ICD-10-CM | POA: Diagnosis not present

## 2015-09-19 DIAGNOSIS — M545 Low back pain: Secondary | ICD-10-CM | POA: Diagnosis not present

## 2015-09-24 DIAGNOSIS — H02831 Dermatochalasis of right upper eyelid: Secondary | ICD-10-CM | POA: Diagnosis not present

## 2015-09-24 DIAGNOSIS — H02834 Dermatochalasis of left upper eyelid: Secondary | ICD-10-CM | POA: Diagnosis not present

## 2015-09-24 DIAGNOSIS — M546 Pain in thoracic spine: Secondary | ICD-10-CM | POA: Diagnosis not present

## 2015-09-24 DIAGNOSIS — M545 Low back pain: Secondary | ICD-10-CM | POA: Diagnosis not present

## 2015-09-26 DIAGNOSIS — M545 Low back pain: Secondary | ICD-10-CM | POA: Diagnosis not present

## 2015-09-26 DIAGNOSIS — M546 Pain in thoracic spine: Secondary | ICD-10-CM | POA: Diagnosis not present

## 2015-09-27 ENCOUNTER — Other Ambulatory Visit: Payer: Self-pay | Admitting: Family

## 2015-09-27 NOTE — Telephone Encounter (Signed)
Gabapentin refill sent to pharmacy. Pt due for 6 month follow up with Melissa on 10/18/15.  Please call pt to arrange appt.  Thanks!

## 2015-09-27 NOTE — Telephone Encounter (Signed)
Pt has been scheduled.  °

## 2015-10-01 DIAGNOSIS — M545 Low back pain: Secondary | ICD-10-CM | POA: Diagnosis not present

## 2015-10-01 DIAGNOSIS — M546 Pain in thoracic spine: Secondary | ICD-10-CM | POA: Diagnosis not present

## 2015-10-03 DIAGNOSIS — M4126 Other idiopathic scoliosis, lumbar region: Secondary | ICD-10-CM | POA: Diagnosis not present

## 2015-10-03 DIAGNOSIS — M546 Pain in thoracic spine: Secondary | ICD-10-CM | POA: Diagnosis not present

## 2015-10-03 DIAGNOSIS — M47812 Spondylosis without myelopathy or radiculopathy, cervical region: Secondary | ICD-10-CM | POA: Diagnosis not present

## 2015-10-03 DIAGNOSIS — M47816 Spondylosis without myelopathy or radiculopathy, lumbar region: Secondary | ICD-10-CM | POA: Diagnosis not present

## 2015-10-03 DIAGNOSIS — M545 Low back pain: Secondary | ICD-10-CM | POA: Diagnosis not present

## 2015-10-08 DIAGNOSIS — M546 Pain in thoracic spine: Secondary | ICD-10-CM | POA: Diagnosis not present

## 2015-10-08 DIAGNOSIS — M545 Low back pain: Secondary | ICD-10-CM | POA: Diagnosis not present

## 2015-10-14 DIAGNOSIS — H532 Diplopia: Secondary | ICD-10-CM | POA: Diagnosis not present

## 2015-10-14 DIAGNOSIS — Z882 Allergy status to sulfonamides status: Secondary | ICD-10-CM | POA: Diagnosis not present

## 2015-10-14 DIAGNOSIS — H02403 Unspecified ptosis of bilateral eyelids: Secondary | ICD-10-CM | POA: Diagnosis not present

## 2015-10-14 DIAGNOSIS — Z9842 Cataract extraction status, left eye: Secondary | ICD-10-CM | POA: Diagnosis not present

## 2015-10-14 DIAGNOSIS — I10 Essential (primary) hypertension: Secondary | ICD-10-CM | POA: Diagnosis not present

## 2015-10-14 DIAGNOSIS — Z79899 Other long term (current) drug therapy: Secondary | ICD-10-CM | POA: Diagnosis not present

## 2015-10-14 DIAGNOSIS — Z950 Presence of cardiac pacemaker: Secondary | ICD-10-CM | POA: Diagnosis not present

## 2015-10-14 DIAGNOSIS — Z7982 Long term (current) use of aspirin: Secondary | ICD-10-CM | POA: Diagnosis not present

## 2015-10-14 DIAGNOSIS — Z9841 Cataract extraction status, right eye: Secondary | ICD-10-CM | POA: Diagnosis not present

## 2015-10-14 DIAGNOSIS — Z885 Allergy status to narcotic agent status: Secondary | ICD-10-CM | POA: Diagnosis not present

## 2015-10-14 DIAGNOSIS — E039 Hypothyroidism, unspecified: Secondary | ICD-10-CM | POA: Diagnosis not present

## 2015-10-14 DIAGNOSIS — Z961 Presence of intraocular lens: Secondary | ICD-10-CM | POA: Diagnosis not present

## 2015-10-15 DIAGNOSIS — M47812 Spondylosis without myelopathy or radiculopathy, cervical region: Secondary | ICD-10-CM | POA: Diagnosis not present

## 2015-10-15 DIAGNOSIS — R269 Unspecified abnormalities of gait and mobility: Secondary | ICD-10-CM | POA: Diagnosis not present

## 2015-10-15 DIAGNOSIS — M4126 Other idiopathic scoliosis, lumbar region: Secondary | ICD-10-CM | POA: Diagnosis not present

## 2015-10-15 DIAGNOSIS — M47816 Spondylosis without myelopathy or radiculopathy, lumbar region: Secondary | ICD-10-CM | POA: Diagnosis not present

## 2015-10-16 ENCOUNTER — Encounter: Payer: Self-pay | Admitting: Family

## 2015-10-16 ENCOUNTER — Telehealth: Payer: Self-pay | Admitting: *Deleted

## 2015-10-16 ENCOUNTER — Ambulatory Visit (INDEPENDENT_AMBULATORY_CARE_PROVIDER_SITE_OTHER): Payer: Medicare Other | Admitting: Family

## 2015-10-16 VITALS — BP 110/70 | HR 70 | Temp 97.7°F | Resp 18 | Ht 65.0 in | Wt 144.8 lb

## 2015-10-16 DIAGNOSIS — M810 Age-related osteoporosis without current pathological fracture: Secondary | ICD-10-CM | POA: Diagnosis not present

## 2015-10-16 DIAGNOSIS — G4700 Insomnia, unspecified: Secondary | ICD-10-CM | POA: Diagnosis not present

## 2015-10-16 DIAGNOSIS — E039 Hypothyroidism, unspecified: Secondary | ICD-10-CM | POA: Diagnosis not present

## 2015-10-16 DIAGNOSIS — I10 Essential (primary) hypertension: Secondary | ICD-10-CM | POA: Diagnosis not present

## 2015-10-16 LAB — BASIC METABOLIC PANEL
BUN: 12 mg/dL (ref 6–23)
CALCIUM: 9.9 mg/dL (ref 8.4–10.5)
CHLORIDE: 107 meq/L (ref 96–112)
CO2: 27 meq/L (ref 19–32)
CREATININE: 0.71 mg/dL (ref 0.40–1.20)
GFR: 84.35 mL/min (ref >60.00)
Glucose, Bld: 92 mg/dL (ref 70–99)
Potassium: 4.5 mEq/L (ref 3.5–5.1)
SODIUM: 142 meq/L (ref 135–145)

## 2015-10-16 LAB — TSH: TSH: 0.56 u[IU]/mL (ref 0.35–4.50)

## 2015-10-16 MED ORDER — AMLODIPINE BESYLATE 5 MG PO TABS: 5.0000 mg | ORAL_TABLET | Freq: Every day | ORAL | Status: DC

## 2015-10-16 NOTE — Assessment & Plan Note (Signed)
Stable on synthroid, continue same, obtain tsh.

## 2015-10-16 NOTE — Assessment & Plan Note (Signed)
BP stable on current meds.  Obtain bmet. 

## 2015-10-16 NOTE — Progress Notes (Signed)
Subjective:    Patient ID: [name], female    DOB: 09/10/1934, 81 y.o.   MRN: [id number]  HPI  [name] is a 81 yr old female here today for follow up.  1) HTN- maintained on amlodipine 5mg .  BP Readings from Last 3 Encounters:  10/16/15 110/70  06/18/15 146/88  06/06/15 132/87   2) Insomnia- using tylenol pm prn.  Also has xanax as needed for sleep.   3) Hypothyroid- currently on synthroid 124mcg.  Lab Results  Component Value Date   TSH 0.70 04/19/2015   Review of Systems  Constitutional: Negative for fatigue.  Respiratory: Negative for shortness of breath.   Cardiovascular: Negative for chest pain and leg swelling.  notes some local discomfort from pacemaker last week which has resolved.     Past Medical History  Diagnosis Date  . Hypothyroidism   . Anemia     pt denies  . Osteoporosis   . Pleural effusion, bilateral     history of  . Pulmonary nodules     history of  . History of pancreatitis     pt denies  . Cystocele     history with rectocele, pt denies  . Scoliosis   . Varicose vein   . Non Hodgkin's lymphoma ([place])   . Acute bronchitis 10/09/2012  . Pacemaker 09/15/2011    Medtronic Revo  . Dysrhythmia   . Shortness of breath     with exertion  . GERD (gastroesophageal reflux disease)   . H/O hiatal hernia   . Arthritis   . Cataract     bil  . Hemorrhoid   . Pericarditis   . Atrial fibrillation [facility])      Social History   Social History  . Marital Status: Widowed    Spouse Name: N/A  . Number of Children: 3  . Years of Education: N/A   Occupational History  . self employed    Social History Main Topics  . Smoking status: Never Smoker   . Smokeless tobacco: Never Used  . Alcohol Use: Yes     Comment: 1 glass of wine occasionally  . Drug Use: No  . Sexual Activity: Not on file   Other Topics Concern  . 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  She lives alone in a 3-story home.  There is a lift chair which she does not use (placed for her husband).    Past Surgical History  Procedure Laterality Date  . Pacemaker insertion  09/15/11    MDT  . Tonsillectomy    . Breast surgery  07/30/01    biopsy x 3, bilateral  . Abdominal hysterectomy  04/29/04    partial  . Mass excision Right 10/28/2012    Procedure: Removal of mass on neck       ;  Surgeon: Adin Hector, MD;  Location: [facility];  Service: General;  Laterality: Right;  . Colonoscopy    . Eye surgery      cataracts  . Permanent pacemaker insertion N/A 09/15/2011    Procedure: PERMANENT PACEMAKER INSERTION;  Surgeon: Sanda Klein, MD;  Location: [place] CATH LAB;  Service: Cardiovascular;  Laterality: N/A;    Family History  Problem Relation Age of Onset  . Heart disease Daughter     congenital "whole in her heart"  . Stroke Mother   . Lymphoma Daughter  nonhodgkins  . Stroke Father   . Breast cancer Sister   . Heart disease Brother     Allergies  Allergen Reactions  . Amoxicillin Hives    Hives  . Codeine Hives    REACTION: "makes her crazy" Hives  . Demerol Hives  . Morphine And Related Hives  . Vicodin [Hydrocodone-Acetaminophen] Hives  . Sulfonamide Derivatives Rash    REACTION: Rash    Current Outpatient Prescriptions on File Prior to Visit  Medication Sig Dispense Refill  . acetaminophen (TYLENOL) 500 MG tablet Take 500 mg by mouth every 6 (six) hours as needed for pain.    [name] ALPRAZolam (XANAX) 0.5 MG tablet take 1 tablet by mouth at bedtime if needed FOR SLEEP 30 tablet 0  . amLODipine (NORVASC) 5 MG tablet Take 1 tablet (5 mg total) by mouth daily. 90 tablet 1  . aspirin 81 MG tablet Take 81 mg by mouth daily.    . Calcium Carbonate-Vitamin D (CALTRATE 600+D) 600-400 MG-UNIT per tablet Take 1 tablet by mouth 2 (two) times daily.    [name] denosumab (PROLIA) 60 MG/ML SOLN injection Inject 60 mg into the skin every 6 (six) months.  Reported on 10/16/2015    . ferrous fumarate (HEMOCYTE - 106 MG FE) 325 (106 FE) MG TABS tablet Take 1 tablet by mouth.    . gabapentin (NEURONTIN) 100 MG capsule take 1 capsule by mouth three times a day 90 capsule 5  . KRILL OIL OMEGA-3 PO Take 1 capsule by mouth daily.    [name] levothyroxine (SYNTHROID, LEVOTHROID) 100 MCG tablet take 1 tablet by mouth once daily 30 tablet 5  . omeprazole (PRILOSEC) 40 MG capsule take 1 capsule by mouth once daily 30 capsule 5  . vitamin B-12 (CYANOCOBALAMIN) 100 MCG tablet Take 100 mcg by mouth daily.     No current facility-administered medications on file prior to visit.    BP 110/70 mmHg  Pulse 70  Temp(Src) 97.7 F (36.5 C) (Oral)  Resp 18  Ht 5\' 5"  (1.651 m)  Wt 144 lb 12.8 oz (65.681 kg)  BMI 24.10 kg/m2  SpO2 94%    Objective:   Physical Exam  Constitutional: She is oriented to person, place, and time. She appears well-developed and well-nourished.  HENT:  Head: Normocephalic and atraumatic.  Cardiovascular: Normal rate, regular rhythm and normal heart sounds.   No murmur heard. Pulmonary/Chest: Effort normal and breath sounds normal. No respiratory distress. She has no wheezes.  Musculoskeletal: She exhibits no edema.  Neurological: She is alert and oriented to person, place, and time.  Psychiatric: She has a normal mood and affect. Her behavior is normal. Judgment and thought content normal.          Assessment & Plan:

## 2015-10-16 NOTE — Progress Notes (Signed)
Pre visit review using our clinic review tool, if applicable. No additional management support is needed unless otherwise documented below in the visit note. 

## 2015-10-16 NOTE — Telephone Encounter (Signed)
Pt is due for Prolia injection on 10/28/15. Can you verify her benefits? Thanks!

## 2015-10-16 NOTE — Assessment & Plan Note (Signed)
Using either tylenol pm or alprazolam. Knows not to take both. Stable.

## 2015-10-16 NOTE — Assessment & Plan Note (Signed)
prolia due in May.

## 2015-10-16 NOTE — Patient Instructions (Signed)
Please complete lab work prior to leaving (UDS).  

## 2015-10-18 DIAGNOSIS — M79604 Pain in right leg: Secondary | ICD-10-CM | POA: Diagnosis not present

## 2015-10-18 DIAGNOSIS — M79605 Pain in left leg: Secondary | ICD-10-CM | POA: Diagnosis not present

## 2015-10-22 DIAGNOSIS — M545 Low back pain: Secondary | ICD-10-CM | POA: Diagnosis not present

## 2015-10-22 DIAGNOSIS — M546 Pain in thoracic spine: Secondary | ICD-10-CM | POA: Diagnosis not present

## 2015-10-24 DIAGNOSIS — M546 Pain in thoracic spine: Secondary | ICD-10-CM | POA: Diagnosis not present

## 2015-10-24 DIAGNOSIS — M545 Low back pain: Secondary | ICD-10-CM | POA: Diagnosis not present

## 2015-10-25 ENCOUNTER — Telehealth: Payer: Self-pay | Admitting: *Deleted

## 2015-10-25 MED ORDER — ALPRAZOLAM 0.5 MG PO TABS: ORAL_TABLET | ORAL | Status: DC

## 2015-10-25 NOTE — Telephone Encounter (Signed)
Rx faxed to pharmacy  

## 2015-10-25 NOTE — Telephone Encounter (Signed)
Last alprazolam 09/18/14, #60 Last UDS: pending from 4/19? Last OV:  OV;  Due for physical.  Rx printed and forwarded to PCP for signature.

## 2015-10-29 DIAGNOSIS — M79604 Pain in right leg: Secondary | ICD-10-CM | POA: Diagnosis not present

## 2015-10-29 DIAGNOSIS — M545 Low back pain: Secondary | ICD-10-CM | POA: Diagnosis not present

## 2015-10-29 DIAGNOSIS — M79605 Pain in left leg: Secondary | ICD-10-CM | POA: Diagnosis not present

## 2015-10-29 DIAGNOSIS — M546 Pain in thoracic spine: Secondary | ICD-10-CM | POA: Diagnosis not present

## 2015-10-30 NOTE — Telephone Encounter (Signed)
I have electronically submitted pt's info for Prolia insurance verification and will notify you once I have a response. Thank you. °

## 2015-10-31 DIAGNOSIS — M546 Pain in thoracic spine: Secondary | ICD-10-CM | POA: Diagnosis not present

## 2015-10-31 DIAGNOSIS — M545 Low back pain: Secondary | ICD-10-CM | POA: Diagnosis not present

## 2015-11-05 DIAGNOSIS — M546 Pain in thoracic spine: Secondary | ICD-10-CM | POA: Diagnosis not present

## 2015-11-05 DIAGNOSIS — M545 Low back pain: Secondary | ICD-10-CM | POA: Diagnosis not present

## 2015-11-12 DIAGNOSIS — M545 Low back pain: Secondary | ICD-10-CM | POA: Diagnosis not present

## 2015-11-12 DIAGNOSIS — M546 Pain in thoracic spine: Secondary | ICD-10-CM | POA: Diagnosis not present

## 2015-11-13 NOTE — Telephone Encounter (Signed)
I have rec'd Ms. Sibley's insurance verification for Prolia and w/out an OV she will have an estimated responsibility of $195; w/an OV she will have an estimated responsibility of $235.  Please make pt aware this is an estimate and we will not know an exact amt until insurance(s) has/have paid.  I have sent a copy of the summary of benefits to be scanned into pt's chart.    If pt cannot afford [phone] for her injection, please advise her to contact Prolia at [phone] and select option #1 to see if she qualifies for one of their assistance programs.  If she qualifies they will instruct her how to proceed.    Once pt recs injection, please let me know actual injection date so I can update the Prolia portal.  If you have any questions, please let me know.  Thank you!

## 2015-11-14 DIAGNOSIS — M546 Pain in thoracic spine: Secondary | ICD-10-CM | POA: Diagnosis not present

## 2015-11-14 DIAGNOSIS — M545 Low back pain: Secondary | ICD-10-CM | POA: Diagnosis not present

## 2015-11-15 NOTE — Telephone Encounter (Signed)
Left message for pt to return my call.

## 2015-11-18 NOTE — Telephone Encounter (Signed)
Notified pt and scheduled nurse visit for prolia injection for 11/26/15 at 10:30am. Injection ordered.

## 2015-11-18 NOTE — Telephone Encounter (Signed)
Attempted to reach pt and left message for her to return my call.  Foster, Erica     Phone Number: [phone]            Patient states a nurse called her last week, patient doesn't know who called i advised patient i dont see any notes. Please advise

## 2015-11-20 DIAGNOSIS — M47816 Spondylosis without myelopathy or radiculopathy, lumbar region: Secondary | ICD-10-CM | POA: Diagnosis not present

## 2015-11-20 DIAGNOSIS — M47812 Spondylosis without myelopathy or radiculopathy, cervical region: Secondary | ICD-10-CM | POA: Diagnosis not present

## 2015-11-21 DIAGNOSIS — M545 Low back pain: Secondary | ICD-10-CM | POA: Diagnosis not present

## 2015-11-21 DIAGNOSIS — M546 Pain in thoracic spine: Secondary | ICD-10-CM | POA: Diagnosis not present

## 2015-11-23 ENCOUNTER — Other Ambulatory Visit: Payer: Self-pay | Admitting: Family

## 2015-11-26 ENCOUNTER — Ambulatory Visit (INDEPENDENT_AMBULATORY_CARE_PROVIDER_SITE_OTHER): Payer: Medicare Other | Admitting: *Deleted

## 2015-11-26 DIAGNOSIS — M810 Age-related osteoporosis without current pathological fracture: Secondary | ICD-10-CM | POA: Diagnosis not present

## 2015-11-26 DIAGNOSIS — M545 Low back pain: Secondary | ICD-10-CM | POA: Diagnosis not present

## 2015-11-26 DIAGNOSIS — M546 Pain in thoracic spine: Secondary | ICD-10-CM | POA: Diagnosis not present

## 2015-11-26 MED ORDER — DENOSUMAB 60 MG/ML ~~LOC~~ SOLN
60.0000 mg | Freq: Once | SUBCUTANEOUS | Status: AC
  Administered 2015-11-26: 60 mg via SUBCUTANEOUS

## 2015-11-26 NOTE — Progress Notes (Deleted)
   Subjective:    Patient ID: [name], female    DOB: [birth year], 81 y.o.   MRN: [id number]  HPI    Review of Systems     Objective:   Physical Exam        Assessment & Plan:

## 2015-11-26 NOTE — Progress Notes (Signed)
Pre visit review using our clinic review tool, if applicable. No additional management support is needed unless otherwise documented below in the visit note.  Pt tolerated injection well.   Next injection due in 6 months.//AB/CMA

## 2015-11-27 ENCOUNTER — Other Ambulatory Visit: Payer: Self-pay | Admitting: Family

## 2015-11-28 DIAGNOSIS — M546 Pain in thoracic spine: Secondary | ICD-10-CM | POA: Diagnosis not present

## 2015-11-28 DIAGNOSIS — M545 Low back pain: Secondary | ICD-10-CM | POA: Diagnosis not present

## 2015-12-02 DIAGNOSIS — H52202 Unspecified astigmatism, left eye: Secondary | ICD-10-CM | POA: Diagnosis not present

## 2015-12-02 DIAGNOSIS — H52201 Unspecified astigmatism, right eye: Secondary | ICD-10-CM | POA: Diagnosis not present

## 2015-12-02 DIAGNOSIS — H524 Presbyopia: Secondary | ICD-10-CM | POA: Diagnosis not present

## 2015-12-02 DIAGNOSIS — H5201 Hypermetropia, right eye: Secondary | ICD-10-CM | POA: Diagnosis not present

## 2015-12-02 DIAGNOSIS — H5212 Myopia, left eye: Secondary | ICD-10-CM | POA: Diagnosis not present

## 2015-12-03 DIAGNOSIS — M545 Low back pain: Secondary | ICD-10-CM | POA: Diagnosis not present

## 2015-12-03 DIAGNOSIS — M546 Pain in thoracic spine: Secondary | ICD-10-CM | POA: Diagnosis not present

## 2015-12-04 DIAGNOSIS — M79605 Pain in left leg: Secondary | ICD-10-CM | POA: Diagnosis not present

## 2015-12-04 DIAGNOSIS — M79604 Pain in right leg: Secondary | ICD-10-CM | POA: Diagnosis not present

## 2015-12-05 ENCOUNTER — Ambulatory Visit (HOSPITAL_BASED_OUTPATIENT_CLINIC_OR_DEPARTMENT_OTHER): Payer: Medicare Other | Admitting: Internal Medicine

## 2015-12-05 ENCOUNTER — Telehealth: Payer: Self-pay | Admitting: Internal Medicine

## 2015-12-05 ENCOUNTER — Other Ambulatory Visit (HOSPITAL_BASED_OUTPATIENT_CLINIC_OR_DEPARTMENT_OTHER): Payer: Medicare Other

## 2015-12-05 ENCOUNTER — Encounter: Payer: Self-pay | Admitting: Internal Medicine

## 2015-12-05 VITALS — BP 136/81 | HR 83 | Temp 98.2°F | Resp 18 | Wt 147.0 lb

## 2015-12-05 DIAGNOSIS — C8221 Follicular lymphoma grade III, unspecified, lymph nodes of head, face, and neck: Secondary | ICD-10-CM

## 2015-12-05 DIAGNOSIS — C8590 Non-Hodgkin lymphoma, unspecified, unspecified site: Secondary | ICD-10-CM

## 2015-12-05 DIAGNOSIS — M546 Pain in thoracic spine: Secondary | ICD-10-CM | POA: Diagnosis not present

## 2015-12-05 DIAGNOSIS — M545 Low back pain: Secondary | ICD-10-CM | POA: Diagnosis not present

## 2015-12-05 LAB — CBC WITH DIFFERENTIAL/PLATELET
BASO%: 2.1 % — ABNORMAL HIGH (ref 0.0–2.0)
BASOS ABS: 0.1 10*3/uL (ref 0.0–0.1)
EOS ABS: 0.2 10*3/uL (ref 0.0–0.5)
EOS%: 3.2 % (ref 0.0–7.0)
HEMATOCRIT: 44.8 % (ref 34.8–46.6)
HEMOGLOBIN: 15.1 g/dL (ref 11.6–15.9)
LYMPH#: 1.7 10*3/uL (ref 0.9–3.3)
LYMPH%: 29.8 % (ref 14.0–49.7)
MCH: 32.3 pg (ref 25.1–34.0)
MCHC: 33.6 g/dL (ref 31.5–36.0)
MCV: 96 fL (ref 79.5–101.0)
MONO#: 0.5 10*3/uL (ref 0.1–0.9)
MONO%: 8.5 % (ref 0.0–14.0)
NEUT%: 56.4 % (ref 38.4–76.8)
NEUTROS ABS: 3.2 10*3/uL (ref 1.5–6.5)
Platelets: 251 10*3/uL (ref 145–400)
RBC: 4.67 10*6/uL (ref 3.70–5.45)
RDW: 14 % (ref 11.2–14.5)
WBC: 5.6 10*3/uL (ref 3.9–10.3)

## 2015-12-05 LAB — COMPREHENSIVE METABOLIC PANEL
ALBUMIN: 4.3 g/dL (ref 3.5–5.0)
ALK PHOS: 56 U/L (ref 40–150)
ALT: 16 U/L (ref 0–55)
AST: 21 U/L (ref 5–34)
Anion Gap: 9 mEq/L (ref 3–11)
BILIRUBIN TOTAL: 0.55 mg/dL (ref 0.20–1.20)
BUN: 13 mg/dL (ref 7.0–26.0)
CALCIUM: 9.8 mg/dL (ref 8.4–10.4)
CO2: 26 mEq/L (ref 22–29)
CREATININE: 0.8 mg/dL (ref 0.6–1.1)
Chloride: 107 mEq/L (ref 98–109)
EGFR: 69 mL/min/{1.73_m2} — ABNORMAL LOW (ref >90)
GLUCOSE: 86 mg/dL (ref 70–140)
POTASSIUM: 4.2 meq/L (ref 3.5–5.1)
SODIUM: 142 meq/L (ref 136–145)
TOTAL PROTEIN: 7.3 g/dL (ref 6.4–8.3)

## 2015-12-05 LAB — LACTATE DEHYDROGENASE: LDH: 240 U/L (ref 125–245)

## 2015-12-05 NOTE — Telephone Encounter (Signed)
sw pt and advised on DEC appt...pt ok and aware °

## 2015-12-05 NOTE — Progress Notes (Signed)
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 in [place].  2) Status post surgical excision of the right neck mass under the care of Dr. Johney Maine on 10/28/2012.  3)  Maintenance Rituxan 375 mg/M2 every 2 months status post 12 cycles, last dose was given 10/01/2014.  CURRENT THERAPY: Observation.  ADVANCED DIRECTIVE: Does not have advanced directive.   INTERVAL HISTORY: [name] 81 y.o. female returns to the clinic today for 54-month followup visit accompanied by her daughter. The patient is feeling fine today with no specific complaints. She is currently on observation. She has no palpable lymphadenopathy. She denied having any significant nausea or vomiting. She has no fever or chills. The patient denied having any significant weight loss or night sweats. She has no chest pain, shortness of breath except with exertion but no cough or hemoptysis. She had repeat CBC, comprehensive metabolic panel and LDH performed earlier today and she is here for evaluation and discussion of her lab results.  MEDICAL HISTORY: Past Medical History  Diagnosis Date  . Hypothyroidism   . Anemia     pt denies  . Osteoporosis   . Pleural effusion, bilateral     history of  . Pulmonary nodules     history of  . History of pancreatitis     pt denies  . Cystocele     history with rectocele, pt denies  . Scoliosis   . Varicose vein   . Non Hodgkin's lymphoma ([place])   . Acute bronchitis 10/09/2012  . Pacemaker 09/15/2011    Medtronic  Revo  . Dysrhythmia   . Shortness of breath     with exertion  . GERD (gastroesophageal reflux disease)   . H/O hiatal hernia   . Arthritis   . Cataract     bil  . Hemorrhoid   . Pericarditis   . Atrial fibrillation ([facility])     ALLERGIES:  is allergic to amoxicillin; codeine; demerol; morphine and related; vicodin; and sulfonamide derivatives.  MEDICATIONS:  Current Outpatient Prescriptions  Medication Sig Dispense Refill  . acetaminophen (TYLENOL) 500 MG tablet Take 500 mg by mouth every 6 (six) hours as needed for pain.      [name] ALPRAZolam (XANAX) 0.5 MG tablet take 1 tablet by mouth once daily if needed for sleep  30 tablet  0  . amLODipine (NORVASC) 10 MG tablet take 1 tablet by mouth once daily  DUE FOR A FOLLOW-UP OFFICE VISIT  90 tablet  1  . aspirin 81 MG tablet Take 81 mg by mouth daily.      . Calcium Carbonate-Vitamin D (CALTRATE 600+D) 600-400 MG-UNIT per tablet Take 1 tablet by mouth 2 (two) times daily.      [name] denosumab (PROLIA) 60 MG/ML SOLN injection Inject 60 mg into the skin every 6 (six) months. Administer in upper arm, thigh, or abdomen      . ferrous fumarate (HEMOCYTE - 106 MG FE) 325 (106 FE) MG TABS tablet Take 1 tablet by mouth.      . gabapentin (NEURONTIN)  100 MG capsule take 1 capsule by mouth three times a day  90 capsule  2  . KRILL OIL OMEGA-3 PO Take 1 capsule by mouth daily.      [name] levothyroxine (SYNTHROID, LEVOTHROID) 100 MCG tablet take 1 tablet by mouth once daily  90 tablet  1  . omeprazole (PRILOSEC) 40 MG capsule Take 1 capsule (40 mg total) by mouth daily.  180 capsule  1  . vitamin B-12 (CYANOCOBALAMIN) 100 MCG tablet Take 100 mcg by mouth daily.       No current facility-administered medications for this visit.    SURGICAL HISTORY:  Past Surgical History  Procedure Laterality Date  . Pacemaker insertion  09/15/11    MDT  . Tonsillectomy    . Breast surgery  07/30/01    biopsy x 3, bilateral  . Abdominal hysterectomy  04/29/04    partial    . Mass excision Right 10/28/2012    Procedure: Removal of mass on neck       ;  Surgeon: Adin Hector, MD;  Location: [facility];  Service: General;  Laterality: Right;  . Colonoscopy    . Eye surgery      cataracts  . Permanent pacemaker insertion N/A 09/15/2011    Procedure: PERMANENT PACEMAKER INSERTION;  Surgeon: Sanda Klein, MD;  Location: [place] CATH LAB;  Service: Cardiovascular;  Laterality: N/A;    REVIEW OF SYSTEMS:  A comprehensive review of systems was negative except for: Respiratory: positive for dyspnea on exertion   PHYSICAL EXAMINATION: General appearance: alert, cooperative and no distress Head: Normocephalic, without obvious abnormality, atraumatic Neck: no adenopathy, no JVD, supple, symmetrical, trachea midline and thyroid not enlarged, symmetric, no tenderness/mass/nodules Lymph nodes: Cervical, supraclavicular, and axillary nodes normal. Resp: clear to auscultation bilaterally Back: symmetric, no curvature. ROM normal. No CVA tenderness. Cardio: regular rate and rhythm, S1, S2 normal, no murmur, click, rub or gallop GI: soft, non-tender; bowel sounds normal; no masses,  no organomegaly Extremities: extremities normal, atraumatic, no cyanosis or edema Neurologic: Alert and oriented X 3, normal strength and tone. Normal symmetric reflexes. Normal coordination and gait  ECOG PERFORMANCE STATUS: 0 - Asymptomatic  Blood pressure 136/81, pulse 83, temperature 98.2 F (36.8 C), temperature source Oral, resp. rate 18, weight 147 lb (66.679 kg), SpO2 99 %.  LABORATORY DATA: Lab Results  Component Value Date   WBC 5.6 12/05/2015   HGB 15.1 12/05/2015   HCT 44.8 12/05/2015   MCV 96.0 12/05/2015   PLT 251 12/05/2015      Chemistry      Component Value Date/Time   NA 142 10/16/2015 1117   NA 144 06/06/2015 0956   K 4.5 10/16/2015 1117   K 4.0 06/06/2015 0956   CL 107 10/16/2015 1117   CL 109* 12/14/2012 1009   CO2 27 10/16/2015 1117   CO2 24 06/06/2015 0956   BUN  12 10/16/2015 1117   BUN 13.9 06/06/2015 0956   CREATININE 0.71 10/16/2015 1117   CREATININE 0.8 06/06/2015 0956   CREATININE 0.69 08/07/2013 1149      Component Value Date/Time   CALCIUM 9.9 10/16/2015 1117   CALCIUM 9.4 06/06/2015 0956   ALKPHOS 77 06/06/2015 0956   ALKPHOS 77 10/06/2012 1202   AST 17 06/06/2015 0956   AST 28 10/06/2012 1202   ALT 14 06/06/2015 0956   ALT 19 10/06/2012 1202   BILITOT 0.49 06/06/2015 0956   BILITOT 0.3 10/06/2012 1202       RADIOGRAPHIC STUDIES: No  results found. ASSESSMENT AND PLAN: This is a very pleasant 81 years old white female with recurrent non-Hodgkin's lymphoma, follicular center cell type currently on maintenance treatment with single agent Rituxan status post 12 cycles. The patient is tolerating her treatment fairly well with no significant adverse effects. CBC performed earlier today showed no significant abnormality. Comprehensive metabolic panel and LDH are still pending. I discussed the lab result with the patient today. I recommended for her to continue on observation. I would see her back for follow-up visit in 6 months with repeat CBC, comprehensive metabolic panel, LDH and quantitative immunoglobulin. She was advised to call immediately if she has any concerning symptoms in the interval. The patient voices understanding of current disease status and treatment options and is in agreement with the current care plan. All questions were answered. The patient knows to call the clinic with any problems, questions or concerns. We can certainly see the patient much sooner if necessary.  Disclaimer: This note was dictated with voice recognition software. Similar sounding words can inadvertently be transcribed and may not be corrected upon review.

## 2015-12-10 DIAGNOSIS — Z886 Allergy status to analgesic agent status: Secondary | ICD-10-CM | POA: Diagnosis not present

## 2015-12-10 DIAGNOSIS — Z7982 Long term (current) use of aspirin: Secondary | ICD-10-CM | POA: Diagnosis not present

## 2015-12-10 DIAGNOSIS — Z961 Presence of intraocular lens: Secondary | ICD-10-CM | POA: Diagnosis not present

## 2015-12-10 DIAGNOSIS — I10 Essential (primary) hypertension: Secondary | ICD-10-CM | POA: Diagnosis not present

## 2015-12-10 DIAGNOSIS — H02423 Myogenic ptosis of bilateral eyelids: Secondary | ICD-10-CM | POA: Diagnosis not present

## 2015-12-10 DIAGNOSIS — Z882 Allergy status to sulfonamides status: Secondary | ICD-10-CM | POA: Diagnosis not present

## 2015-12-10 DIAGNOSIS — Z79899 Other long term (current) drug therapy: Secondary | ICD-10-CM | POA: Diagnosis not present

## 2015-12-10 DIAGNOSIS — Z885 Allergy status to narcotic agent status: Secondary | ICD-10-CM | POA: Diagnosis not present

## 2015-12-10 DIAGNOSIS — Z950 Presence of cardiac pacemaker: Secondary | ICD-10-CM | POA: Diagnosis not present

## 2015-12-30 ENCOUNTER — Other Ambulatory Visit: Payer: Self-pay | Admitting: Family

## 2015-12-30 NOTE — Telephone Encounter (Signed)
Refill sent per [facility] refill protocol/SLS  

## 2016-01-06 ENCOUNTER — Other Ambulatory Visit: Payer: Self-pay | Admitting: Family

## 2016-01-06 ENCOUNTER — Encounter: Payer: Self-pay | Admitting: Family

## 2016-01-06 NOTE — Telephone Encounter (Signed)
See rx. 

## 2016-01-07 NOTE — Telephone Encounter (Signed)
Rx faxed to the pharmacy.  Confirmation received.//AB/CMA 

## 2016-01-09 ENCOUNTER — Encounter: Payer: Medicare Other | Admitting: *Deleted

## 2016-01-09 ENCOUNTER — Telehealth: Payer: Self-pay | Admitting: Cardiology

## 2016-01-09 ENCOUNTER — Other Ambulatory Visit: Payer: Self-pay | Admitting: Family

## 2016-01-09 NOTE — Telephone Encounter (Signed)
Could you please check with the pharmacy that they received the 7/10 rx for xanax? thanks

## 2016-01-09 NOTE — Telephone Encounter (Signed)
Spoke with pt and reminded pt of remote transmission that is due today. Pt verbalized understanding.   

## 2016-01-10 ENCOUNTER — Encounter: Payer: Self-pay | Admitting: Cardiology

## 2016-01-10 ENCOUNTER — Telehealth: Payer: Self-pay | Admitting: Cardiology

## 2016-01-10 NOTE — Telephone Encounter (Signed)
Spoke w/ pt and she informed me that she can not get her home monitor to go pass the power light. Instructed pt to call tech services. Pt verbalized understanding.

## 2016-01-10 NOTE — Telephone Encounter (Signed)
New Message:   Please call,could not transmit.

## 2016-01-13 NOTE — Telephone Encounter (Signed)
Spoke with pharmacy and verified they received Rx and it is ready for pt to pick up. Notified pt.

## 2016-01-16 ENCOUNTER — Ambulatory Visit (INDEPENDENT_AMBULATORY_CARE_PROVIDER_SITE_OTHER): Payer: Medicare Other | Admitting: *Deleted

## 2016-01-16 ENCOUNTER — Telehealth: Payer: Self-pay | Admitting: Cardiovascular Disease

## 2016-01-16 DIAGNOSIS — I495 Sick sinus syndrome: Secondary | ICD-10-CM | POA: Diagnosis not present

## 2016-01-16 NOTE — Telephone Encounter (Signed)
New message  Pt call requesting to speak with RN. Pt wants to know if transmission was received. Please call back to discuss

## 2016-01-16 NOTE — Telephone Encounter (Signed)
Spoke w/ pt and informed her that we did receive her remote transmission. Pt verbalized understanding.  

## 2016-01-17 ENCOUNTER — Encounter: Payer: Self-pay | Admitting: Cardiology

## 2016-01-17 ENCOUNTER — Other Ambulatory Visit: Payer: Self-pay | Admitting: Family

## 2016-01-17 NOTE — Progress Notes (Signed)
Remote pacemaker transmission.   

## 2016-01-28 LAB — CUP PACEART REMOTE DEVICE CHECK
Battery Voltage: 2.99 V
Brady Statistic RA Percent Paced: 93.78 %
Implantable Lead Implant Date: 20130319
Implantable Lead Location: 753860
Implantable Lead Model: 5086
Implantable Lead Model: 5086
Lead Channel Impedance Value: 408 Ohm
Lead Channel Impedance Value: 520 Ohm
Lead Channel Setting Pacing Amplitude: 2 V
MDC IDC LEAD IMPLANT DT: 20130319
MDC IDC LEAD LOCATION: 753859
MDC IDC MSMT LEADCHNL RA SENSING INTR AMPL: 2.484 mV
MDC IDC MSMT LEADCHNL RV SENSING INTR AMPL: 15.009 mV
MDC IDC SESS DTM: 20170720200640

## 2016-02-03 ENCOUNTER — Encounter: Payer: Self-pay | Admitting: Cardiovascular Disease

## 2016-02-03 ENCOUNTER — Telehealth: Payer: Self-pay

## 2016-02-03 NOTE — Telephone Encounter (Signed)
Letter in EPIC addressed to PCP (Dr. Lorin Mercy not in [place]), please forward to him

## 2016-02-03 NOTE — Telephone Encounter (Signed)
Request for surgical clearance:   1. What type surgery is being performed? Levator resection OU for ptosis  2. When is this surgery scheduled? pending  3. Are there any medications that need to be held prior to surgery and how long? Aspirin 81 mg; 7 days prior  4. Name of the physician performing surgery: Dr Welford Roche  5. What is the office phone and fax number?  Phone [phone]  Fax [phone]

## 2016-02-05 DIAGNOSIS — I495 Sick sinus syndrome: Secondary | ICD-10-CM | POA: Insufficient documentation

## 2016-02-07 NOTE — Telephone Encounter (Signed)
Letter faxed to Dr Lorin Mercy' office.

## 2016-02-12 ENCOUNTER — Other Ambulatory Visit: Payer: Self-pay | Admitting: Family

## 2016-02-12 NOTE — Telephone Encounter (Signed)
Last OV: 09/2015 Next OV: due for medicare wellness visit UDS: 01/2015 Last Alprazolam RF: 01/06/16, #30.  Rx printed and forwarded to PCP for signature.

## 2016-02-13 DIAGNOSIS — B889 Infestation, unspecified: Secondary | ICD-10-CM | POA: Diagnosis not present

## 2016-02-26 DIAGNOSIS — H02423 Myogenic ptosis of bilateral eyelids: Secondary | ICD-10-CM | POA: Diagnosis not present

## 2016-02-26 DIAGNOSIS — Z7982 Long term (current) use of aspirin: Secondary | ICD-10-CM | POA: Diagnosis not present

## 2016-02-26 DIAGNOSIS — I10 Essential (primary) hypertension: Secondary | ICD-10-CM | POA: Diagnosis not present

## 2016-02-26 DIAGNOSIS — Z950 Presence of cardiac pacemaker: Secondary | ICD-10-CM | POA: Diagnosis not present

## 2016-03-03 DIAGNOSIS — Z950 Presence of cardiac pacemaker: Secondary | ICD-10-CM | POA: Diagnosis not present

## 2016-03-03 DIAGNOSIS — I10 Essential (primary) hypertension: Secondary | ICD-10-CM | POA: Diagnosis not present

## 2016-03-03 DIAGNOSIS — Z48810 Encounter for surgical aftercare following surgery on the sense organs: Secondary | ICD-10-CM | POA: Diagnosis not present

## 2016-03-03 DIAGNOSIS — Z880 Allergy status to penicillin: Secondary | ICD-10-CM | POA: Diagnosis not present

## 2016-03-03 DIAGNOSIS — Z888 Allergy status to other drugs, medicaments and biological substances status: Secondary | ICD-10-CM | POA: Diagnosis not present

## 2016-03-03 DIAGNOSIS — Z882 Allergy status to sulfonamides status: Secondary | ICD-10-CM | POA: Diagnosis not present

## 2016-03-11 DIAGNOSIS — M47812 Spondylosis without myelopathy or radiculopathy, cervical region: Secondary | ICD-10-CM | POA: Diagnosis not present

## 2016-03-11 DIAGNOSIS — M47816 Spondylosis without myelopathy or radiculopathy, lumbar region: Secondary | ICD-10-CM | POA: Diagnosis not present

## 2016-03-11 DIAGNOSIS — M791 Myalgia: Secondary | ICD-10-CM | POA: Diagnosis not present

## 2016-03-11 DIAGNOSIS — M4126 Other idiopathic scoliosis, lumbar region: Secondary | ICD-10-CM | POA: Diagnosis not present

## 2016-03-23 ENCOUNTER — Telehealth: Payer: Self-pay | Admitting: Family

## 2016-03-23 NOTE — Telephone Encounter (Signed)
Please schedule pt for follow up appt. Thanks PC

## 2016-03-24 ENCOUNTER — Other Ambulatory Visit: Payer: Self-pay | Admitting: Family

## 2016-03-24 NOTE — Telephone Encounter (Signed)
Pt has been scheduled for 03/31/16 at 10:30

## 2016-03-24 NOTE — Telephone Encounter (Signed)
Last seen 10/16/15 Last filled #30-0 RF Please advise-PC

## 2016-03-26 NOTE — Telephone Encounter (Signed)
Ok to call in #30 zero refills.

## 2016-03-26 NOTE — Telephone Encounter (Signed)
Hi Melissa,  Yes I will definitely start adding on the date a controlled is filled. I do apologize for the inconvenience.  Thanks [employer]

## 2016-03-27 NOTE — Telephone Encounter (Signed)
Was rx called in? thanks

## 2016-03-27 NOTE — Telephone Encounter (Signed)
Last seen 10/16/15 Last filled 02/12/16 #30-0 refills  Please advise PC

## 2016-03-30 NOTE — Telephone Encounter (Signed)
Yes rx was called in Pc

## 2016-03-31 ENCOUNTER — Encounter: Payer: Self-pay | Admitting: Family

## 2016-03-31 ENCOUNTER — Ambulatory Visit (INDEPENDENT_AMBULATORY_CARE_PROVIDER_SITE_OTHER): Payer: Medicare Other | Admitting: Family

## 2016-03-31 ENCOUNTER — Telehealth: Payer: Self-pay | Admitting: *Deleted

## 2016-03-31 VITALS — BP 115/65 | HR 69 | Temp 98.6°F | Resp 18 | Ht 65.0 in | Wt 145.8 lb

## 2016-03-31 DIAGNOSIS — M810 Age-related osteoporosis without current pathological fracture: Secondary | ICD-10-CM

## 2016-03-31 DIAGNOSIS — K219 Gastro-esophageal reflux disease without esophagitis: Secondary | ICD-10-CM

## 2016-03-31 DIAGNOSIS — Z79899 Other long term (current) drug therapy: Secondary | ICD-10-CM | POA: Diagnosis not present

## 2016-03-31 DIAGNOSIS — E039 Hypothyroidism, unspecified: Secondary | ICD-10-CM | POA: Diagnosis not present

## 2016-03-31 DIAGNOSIS — R002 Palpitations: Secondary | ICD-10-CM

## 2016-03-31 DIAGNOSIS — Z23 Encounter for immunization: Secondary | ICD-10-CM | POA: Diagnosis not present

## 2016-03-31 DIAGNOSIS — R252 Cramp and spasm: Secondary | ICD-10-CM | POA: Diagnosis not present

## 2016-03-31 LAB — BASIC METABOLIC PANEL
BUN: 15 mg/dL (ref 6–23)
CHLORIDE: 105 meq/L (ref 96–112)
CO2: 29 meq/L (ref 19–32)
Calcium: 9.2 mg/dL (ref 8.4–10.5)
Creatinine, Ser: 0.72 mg/dL (ref 0.40–1.20)
GFR: 82.9 mL/min (ref >60.00)
GLUCOSE: 79 mg/dL (ref 70–99)
POTASSIUM: 4.2 meq/L (ref 3.5–5.1)
Sodium: 141 mEq/L (ref 135–145)

## 2016-03-31 LAB — MAGNESIUM: MAGNESIUM: 1.8 mg/dL (ref 1.5–2.5)

## 2016-03-31 LAB — TSH: TSH: 1.62 u[IU]/mL (ref 0.35–4.50)

## 2016-03-31 MED ORDER — PANTOPRAZOLE SODIUM 40 MG PO TBEC: 40.0000 mg | DELAYED_RELEASE_TABLET | Freq: Every day | ORAL | 5 refills | Status: DC

## 2016-03-31 NOTE — Assessment & Plan Note (Signed)
Uncontrolled. D/c prilosec, trial of protonix.

## 2016-03-31 NOTE — Progress Notes (Signed)
Pre visit review using our clinic review tool, if applicable. No additional management support is needed unless otherwise documented below in the visit note. 

## 2016-03-31 NOTE — Assessment & Plan Note (Signed)
Continue prolia, next injection due in November.

## 2016-03-31 NOTE — Patient Instructions (Addendum)
Complete lab work prior to leaving. Stop prilosec, start protonix. You will be contacted about your referral to cardiology for follow up.

## 2016-03-31 NOTE — Assessment & Plan Note (Signed)
Clinically stable, obtain follow up TSH.

## 2016-03-31 NOTE — Telephone Encounter (Signed)
Pt will be due for Prolia in November. Can you verify benefits and let me know the outcome? Thanks!

## 2016-03-31 NOTE — Progress Notes (Signed)
Subjective:    Patient ID: [name], female    DOB: 11-16-1934, 81 y.o.   MRN: [id number]  HPI  Ms. [name] is a 81 yr old female who presents today for follow up.  1) HTN- patient is maintained on amlodipine.  She reports that she does have intermittent palpitations. Does have some SOB when that occurs.   BP Readings from Last 3 Encounters:  03/31/16 115/65  12/05/15 136/81  10/16/15 110/70   2) Hypothyroid- patient is maintained on synthroid.  Lab Results  Component Value Date   TSH 0.56 10/16/2015   4) Osteoporosis- she is due for prolia in November.   5) GERD- notes that she has had some breakthrough gerd symptoms on prilosec.   Notes some leg cramping foot cramping.  Review of Systems See HPI  Past Medical History:  Diagnosis Date  . Acute bronchitis 10/09/2012  . Anemia    pt denies  . Arthritis   . Atrial fibrillation ([place])   . Cataract    bil  . Cystocele    history with rectocele, pt denies  . Dysrhythmia   . GERD (gastroesophageal reflux disease)   . H/O hiatal hernia   . Hemorrhoid   . History of pancreatitis    pt denies  . Hypothyroidism   . Non Hodgkin's lymphoma ([place])   . Osteoporosis   . Pacemaker 09/15/2011   Medtronic Revo  . Pericarditis   . Pleural effusion, bilateral    history of  . Pulmonary nodules    history of  . Scoliosis   . Shortness of breath    with exertion  . Varicose vein      Social History   Social History  . Marital status: Widowed    Spouse name: N/A  . Number of children: 3  . Years of education: N/A   Occupational History  . self employed    Social History Main Topics  . Smoking status: Never Smoker  . Smokeless tobacco: Never Used  . Alcohol use Yes     Comment: 1 glass of wine occasionally  . Drug use: No  . Sexual activity: Not on file   Other Topics Concern  . 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  She lives alone in a 3-story home.  There is a lift chair which she does not use (placed for her husband).    Past Surgical History:  Procedure Laterality Date  . ABDOMINAL HYSTERECTOMY  04/29/04   partial  . BREAST SURGERY  07/30/01   biopsy x 3, bilateral  . COLONOSCOPY    . EYE SURGERY     cataracts  . MASS EXCISION Right 10/28/2012   Procedure: Removal of mass on neck       ;  Surgeon: Adin Hector, MD;  Location: [place] [facility];  Service: General;  Laterality: Right;  . PACEMAKER INSERTION  09/15/11   MDT  . PERMANENT PACEMAKER INSERTION N/A 09/15/2011   Procedure: PERMANENT PACEMAKER INSERTION;  Surgeon: Sanda Klein, MD;  Location: [place] [facility] CATH [facility];  Service: Cardiovascular;  Laterality: N/A;  . TONSILLECTOMY      Family History  Problem Relation Age of Onset  . Heart disease Daughter     congenital "whole in her heart"  . Stroke Mother   . Lymphoma Daughter     nonhodgkins  . Stroke Father   . Breast cancer Sister   .  Heart disease Brother     Allergies  Allergen Reactions  . Amoxicillin Hives    Hives  . Buprenorphine Hives  . Codeine Hives    REACTION: "makes her crazy" Hives  . Demerol Hives  . Morphine And Related Hives  . Vicodin [Hydrocodone-Acetaminophen] Hives  . Sulfonamide Derivatives Rash    REACTION: Rash    Current Outpatient Prescriptions on File Prior to Visit  Medication Sig Dispense Refill  . acetaminophen (TYLENOL) 500 MG tablet Take 500 mg by mouth every 6 (six) hours as needed for pain.    [name] ALPRAZolam (XANAX) 0.5 MG tablet take 1 tablet by mouth at bedtime if needed for sleep 30 tablet 0  . amLODipine (NORVASC) 5 MG tablet take 1 tablet by mouth once daily 90 tablet 1  . aspirin 81 MG tablet Take 81 mg by mouth daily.    . baclofen (LIORESAL) 10 MG tablet     . Calcium Carbonate-Vitamin D (CALTRATE 600+D) 600-400 MG-UNIT per tablet Take 1 tablet by mouth 2 (two) times daily.    [name] denosumab (PROLIA) 60 MG/ML SOLN injection Inject 60  mg into the skin every 6 (six) months. Reported on 10/16/2015    . ferrous fumarate (HEMOCYTE - 106 MG FE) 325 (106 FE) MG TABS tablet Take 1 tablet by mouth.    . gabapentin (NEURONTIN) 100 MG capsule take 1 capsule by mouth three times a day 90 capsule 0  . KRILL OIL OMEGA-3 PO Take 1 capsule by mouth daily.    [name] levothyroxine (SYNTHROID, LEVOTHROID) 100 MCG tablet take 1 tablet by mouth once daily 30 tablet 5  . omeprazole (PRILOSEC) 40 MG capsule Take 1 capsule (40 mg total) by mouth daily. 30 capsule 5  . vitamin B-12 (CYANOCOBALAMIN) 100 MCG tablet Take 100 mcg by mouth daily.     No current facility-administered medications on file prior to visit.     BP 115/65 (BP Location: Right Arm, Cuff Size: Normal)   Pulse 69   Temp 98.6 F (37 C) (Oral)   Resp 18   Ht 5\' 5"  (1.651 m)   Wt 145 lb 12.8 oz (66.1 kg)   SpO2 98% Comment: room air  BMI 24.26 kg/m       Objective:   Physical Exam  Constitutional: She is oriented to person, place, and time. She appears well-developed and well-nourished.  Cardiovascular: Normal rate, regular rhythm and normal heart sounds.   No murmur heard. Pulses:      Dorsalis pedis pulses are 2+ on the right side, and 2+ on the left side.       Posterior tibial pulses are 2+ on the right side, and 2+ on the left side.  Pulmonary/Chest: Effort normal and breath sounds normal. No respiratory distress. She has no wheezes.  Musculoskeletal: She exhibits no edema.  Neurological: She is alert and oriented to person, place, and time.  Psychiatric: She has a normal mood and affect. Her behavior is normal. Judgment and thought content normal.          Assessment & Plan:  Palpitations- EKG tracing is personally reviewed.  EKG notes NSR.  No acute changes. She has been evaluated by cardiology in the past for palpitations and is past due for follow up. Will arrange follow up with cardiology.  Leg cramping- check bmet/mag. Discussed stretching and hydration.  LE pulses are intact.    Flu shot today

## 2016-04-01 DIAGNOSIS — H903 Sensorineural hearing loss, bilateral: Secondary | ICD-10-CM | POA: Diagnosis not present

## 2016-04-03 ENCOUNTER — Encounter: Payer: Self-pay | Admitting: Family

## 2016-04-08 DIAGNOSIS — N390 Urinary tract infection, site not specified: Secondary | ICD-10-CM | POA: Diagnosis not present

## 2016-04-08 DIAGNOSIS — Z1231 Encounter for screening mammogram for malignant neoplasm of breast: Secondary | ICD-10-CM | POA: Diagnosis not present

## 2016-04-08 DIAGNOSIS — Z01419 Encounter for gynecological examination (general) (routine) without abnormal findings: Secondary | ICD-10-CM | POA: Diagnosis not present

## 2016-04-08 DIAGNOSIS — Z779 Other contact with and (suspected) exposures hazardous to health: Secondary | ICD-10-CM | POA: Diagnosis not present

## 2016-04-09 ENCOUNTER — Encounter: Payer: Self-pay | Admitting: Cardiovascular Disease

## 2016-04-09 ENCOUNTER — Ambulatory Visit (INDEPENDENT_AMBULATORY_CARE_PROVIDER_SITE_OTHER): Payer: Medicare Other | Admitting: Cardiovascular Disease

## 2016-04-09 VITALS — BP 124/82 | Ht 65.0 in | Wt 146.0 lb

## 2016-04-09 DIAGNOSIS — R5383 Other fatigue: Secondary | ICD-10-CM | POA: Diagnosis not present

## 2016-04-09 DIAGNOSIS — Z950 Presence of cardiac pacemaker: Secondary | ICD-10-CM | POA: Diagnosis not present

## 2016-04-09 DIAGNOSIS — Z79899 Other long term (current) drug therapy: Secondary | ICD-10-CM | POA: Diagnosis not present

## 2016-04-09 DIAGNOSIS — R0602 Shortness of breath: Secondary | ICD-10-CM

## 2016-04-09 DIAGNOSIS — Z8679 Personal history of other diseases of the circulatory system: Secondary | ICD-10-CM

## 2016-04-09 DIAGNOSIS — C8221 Follicular lymphoma grade III, unspecified, lymph nodes of head, face, and neck: Secondary | ICD-10-CM

## 2016-04-09 DIAGNOSIS — E782 Mixed hyperlipidemia: Secondary | ICD-10-CM

## 2016-04-09 DIAGNOSIS — E039 Hypothyroidism, unspecified: Secondary | ICD-10-CM | POA: Diagnosis not present

## 2016-04-09 DIAGNOSIS — R001 Bradycardia, unspecified: Secondary | ICD-10-CM

## 2016-04-09 NOTE — Patient Instructions (Signed)
Medication Instructions: Dr Sallyanne Kuster recommends that you continue on your current medications as directed. Please refer to the Current Medication list given to you today.  Labwork: Your physician recommends that you return for lab work TODAY.  Testing/Procedures: 1. Echocardiogram - Your physician has requested that you have an echocardiogram. Echocardiography is a painless test that uses sound waves to create images of your heart. It provides your doctor with information about the size and shape of your heart and how well your heart's chambers and valves are working. This procedure takes approximately one hour. There are no restrictions for this procedure. This will be performed at our [facility] location - [address].  Follow-up: Dr Sallyanne Kuster recommends that you schedule a follow-up appointment in 1 month.  If you need a refill on your cardiac medications before your next appointment, please call your pharmacy.

## 2016-04-09 NOTE — Progress Notes (Signed)
Cardiology Office Note    Date:  04/09/2016   ID:  [name], DOB [birth year], MRN [id number]  PCP:  [name]., NP  Cardiologist:   Sanda Klein, MD   Chief Complaint  Patient presents with  . Follow-up    12 month f/u. Palpitations per Dr. Inda Foster.  . Dizziness  . Shortness of Breath  . Edema    LAST WEEK    History of Present Illness:  [name] is a 81 y.o. female with sinus node dysfunction and implantation of a dual-chamber permanent pacemaker (Medtronic Revo 2013, programmed AAIR to avoid ventricular paced beats), non-Hodgkin's lymphoma in remission since with complaints of exertional dyspnea, leg edema, cough and occasional wheezing that has occurred in the last 2 weeks. In fact the symptoms were worse about a week ago and have improved, she still has dyspnea with light activity such as getting dressed. She is also had some brief episodes of chest pain occurring at rest described as a mild discomfort. She does not notice any worsening of the discomfort with activity. Denies changes in her diet, recent travel, febrile illness or other intercurrent health problems. She did notice that she had increased pulsatility in her neck during the same period of time.  She has completed treatment with Rituxan for non-Hodgkin's lymphoma and that is now under observation only. He is still taking iron and vitamin B12 supplements, but a hemoglobin checked in June was normal at 15.1. Routine metabolic panel and TSH checked last week was normal.  Pacemaker interrogation shows normal device function. Presenting rhythm is atrial paced ventricular sensed. Battery voltage is 2.99 V (ERI 2.81 V). Lead parameters remain excellent. She has 81% atrial pacing and only 19% atrial sensing on the average. Interestingly, around the time of her symptoms within the last 2 weeks there was a drastic increase in her average daytime and nighttime heart rates to around the 100 bpm and a  marked reduction in the need for atrial pacing. This seems to be returning to baseline fairly rapidly. No atrial arrhythmia has been recorded. A 6 beat nonsustained VT episode occurred on September 15 and she has had similar episodes sporadically through the years.  Past Medical History:  Diagnosis Date  . Acute bronchitis 10/09/2012  . Anemia    pt denies  . Arthritis   . Atrial fibrillation ([place])   . Cataract    bil  . Cystocele    history with rectocele, pt denies  . Dysrhythmia   . GERD (gastroesophageal reflux disease)   . H/O hiatal hernia   . Hemorrhoid   . History of pancreatitis    pt denies  . Hypothyroidism   . Non Hodgkin's lymphoma ([name])   . Osteoporosis   . Pacemaker 09/15/2011   Medtronic Revo  . Pericarditis   . Pleural effusion, bilateral    history of  . Pulmonary nodules    history of  . Scoliosis   . Shortness of breath    with exertion  . Varicose vein     Past Surgical History:  Procedure Laterality Date  . ABDOMINAL HYSTERECTOMY  04/29/04   partial  . BREAST SURGERY  07/30/01   biopsy x 3, bilateral  . COLONOSCOPY    . EYE SURGERY     cataracts  . MASS EXCISION Right 10/28/2012   Procedure: Removal of mass on neck       ;  Surgeon: Adin Hector, MD;  Location: [place];  Service: General;  Laterality: Right;  . PACEMAKER INSERTION  09/15/11   MDT  . PERMANENT PACEMAKER INSERTION N/A 09/15/2011   Procedure: PERMANENT PACEMAKER INSERTION;  Surgeon: Sanda Klein, MD;  Location: [place] CATH LAB;  Service: Cardiovascular;  Laterality: N/A;  . TONSILLECTOMY      Current Medications: Outpatient Medications Prior to Visit  Medication Sig Dispense Refill  . acetaminophen (TYLENOL) 500 MG tablet Take 500 mg by mouth every 6 (six) hours as needed for pain.    [name] ALPRAZolam (XANAX) 0.5 MG tablet take 1 tablet by mouth at bedtime if needed for sleep 30 tablet 0  . amLODipine (NORVASC) 5 MG tablet take 1 tablet by mouth once daily 90 tablet 1  . aspirin 81 MG  tablet Take 81 mg by mouth daily.    . baclofen (LIORESAL) 10 MG tablet     . Calcium Carbonate-Vitamin D (CALTRATE 600+D) 600-400 MG-UNIT per tablet Take 1 tablet by mouth 2 (two) times daily.    [name] denosumab (PROLIA) 60 MG/ML SOLN injection Inject 60 mg into the skin every 6 (six) months. Reported on 10/16/2015    . ferrous fumarate (HEMOCYTE - 106 MG FE) 325 (106 FE) MG TABS tablet Take 1 tablet by mouth.    . gabapentin (NEURONTIN) 100 MG capsule take 1 capsule by mouth three times a day 90 capsule 0  . KRILL OIL OMEGA-3 PO Take 1 capsule by mouth daily.    [name] levothyroxine (SYNTHROID, LEVOTHROID) 100 MCG tablet take 1 tablet by mouth once daily 30 tablet 5  . pantoprazole (PROTONIX) 40 MG tablet Take 1 tablet (40 mg total) by mouth daily. 30 tablet 5  . vitamin B-12 (CYANOCOBALAMIN) 100 MCG tablet Take 100 mcg by mouth daily.     No facility-administered medications prior to visit.      Allergies:   Amoxicillin; Buprenorphine; Codeine; Demerol; Morphine and related; Vicodin [hydrocodone-acetaminophen]; and Sulfonamide derivatives   Social History   Social History  . Marital status: Widowed    Spouse name: N/A  . Number of children: 3  . Years of education: N/A   Occupational History  . self employed    Social History Main Topics  . Smoking status: Never Smoker  . Smokeless tobacco: Never Used  . Alcohol use Yes     Comment: 1 glass of wine occasionally  . Drug use: No  . Sexual activity: Not Asked   Other Topics Concern  . 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  She lives alone in a 3-story home.  There is a lift chair which she does not use (placed for her husband).     Family History:  The patient's family history includes Breast cancer in her sister; Heart disease in her brother and daughter; Lymphoma in her daughter; Stroke in her father and mother.   ROS:   Please see the  history of present illness.    ROS All other systems reviewed and are negative.   PHYSICAL EXAM:   VS:  BP 124/82   Ht 5\' 5"  (1.651 m)   Wt 146 lb (66.2 kg)   BMI 24.30 kg/m    GEN: Well nourished, well developed, in no acute distress  HEENT: normal  Neck: no JVD, carotid bruits, or masses Cardiac: RRR; no murmurs, rubs, or gallops,no edema , healthy left subclavian pacemaker site Respiratory:  clear to auscultation bilaterally, normal work of breathing GI: soft, nontender, nondistended, + BS  MS: no deformity or atrophy  Skin: warm and dry, no rash Neuro:  Alert and Oriented x 3, Strength and sensation are intact Psych: euthymic mood, full affect  Wt Readings from Last 3 Encounters:  04/09/16 146 lb (66.2 kg)  03/31/16 145 lb 12.8 oz (66.1 kg)  12/05/15 147 lb (66.7 kg)      Studies/Labs Reviewed:   EKG:  EKG is not ordered today.  The ekg ordered today demonstrates normal sinus rhythm, slightly delayed precordial R/S transition in V4-V5, no acute repolarization changes, not much change from previous tracings.  Recent Labs: 12/05/2015: ALT 16; HGB 15.1; Platelets 251 03/31/2016: BUN 15; Creatinine, Ser 0.72; Magnesium 1.8; Potassium 4.2; Sodium 141; TSH 1.62   Lipid Panel    Component Value Date/Time   CHOL 238 (H) 10/06/2012 1202   TRIG 199 (H) 10/06/2012 1202   HDL 53 10/06/2012 1202   CHOLHDL 4.5 10/06/2012 1202   VLDL 40 10/06/2012 1202   LDLCALC 145 (H) 10/06/2012 1202    Additional studies/ records that were reviewed today include:  Records from Dr. Jenetta DownerConley Canal and Dr. Inda Merlin, ECG and labs    ASSESSMENT:    1. Shortness of breath   2. Sinus bradycardia, symptomatic, s/p MDT PTVDP 09/15/11   3. Pacemaker   4. 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 History of pericarditis   6. Mixed hyperlipidemia   7. Medication management   8. Other fatigue      PLAN:  In order of problems listed above:  1. Dyspnea: She seems to describe a  transient episodes of congestive heart failure with edema, maybe increased jugular venous pulsations and worsening shortness of breath and tachycardia. However, today there are no residual physical findings to suggest this diagnosis and her heart rate has returned to the usual pattern. Consider transient heart failure due to pulmonary embolism. No evidence of an infectious illness such as pneumonia by history or exam, other than her cough. She does not appear clinically anemic, but will recheck labs. She has normal thyroid function. 2. SSS: Heart rate distribution suggests satisfactory treatment for sinus node dysfunction 3. PM: Normal pacemaker function. The device discloses otherwise unexplained transient increase in underlying heart rate about 2 weeks ago, now resolving. No true arrhythmia detected. Continue remote downloads every 3 months 4. Non-Hodgkin's lymphoma in remission 5. History of post procedural acute pericarditis after pacemaker implantation, resolved. Had atrial fibrillation related to acute pericarditis, which has not recurred in years. 6. HLP: Has mild hypercholesterolemia but has never had coronary or other vascular problems. If no other explanation for her complaints is identified, consider evaluation for coronary insufficiency.    Medication Adjustments/Labs and Tests Ordered: Current medicines are reviewed at length with the patient today.  Concerns regarding medicines are outlined above.  Medication changes, Labs and Tests ordered today are listed in the Patient Instructions below. Patient Instructions  Medication Instructions: Dr Sallyanne Kuster recommends that you continue on your current medications as directed. Please refer to the Current Medication list given to you today.  Labwork: Your physician recommends that you return for lab work TODAY.  Testing/Procedures: 1. Echocardiogram - Your physician has requested that you have an echocardiogram. Echocardiography is a painless  test that uses sound waves to create images of your heart. It provides your doctor with information about the size and shape of your heart and how well your heart's chambers and valves are working. This procedure takes approximately one hour. There are  no restrictions for this procedure. This will be performed at our [facility] location - [address], [address].  Follow-up: Dr Sallyanne Kuster recommends that you schedule a follow-up appointment in 1 month.  If you need a refill on your cardiac medications before your next appointment, please call your pharmacy.    Signed, Sanda Klein, MD  04/09/2016 10:43 AM    [facility], [place], [place]  [address] Phone: [phone]; Fax: [phone]

## 2016-04-10 ENCOUNTER — Telehealth: Payer: Self-pay

## 2016-04-10 ENCOUNTER — Encounter: Payer: Self-pay | Admitting: Family

## 2016-04-10 LAB — CBC
HEMATOCRIT: 42.3 % (ref 35.0–45.0)
HEMOGLOBIN: 14.5 g/dL (ref 11.7–15.5)
MCH: 32.2 pg (ref 27.0–33.0)
MCHC: 34.3 g/dL (ref 32.0–36.0)
MCV: 93.8 fL (ref 80.0–100.0)
MPV: 9.2 fL (ref 7.5–12.5)
Platelets: 363 10*3/uL (ref 140–400)
RBC: 4.51 MIL/uL (ref 3.80–5.10)
RDW: 14.1 % (ref 11.0–15.0)
WBC: 9.3 10*3/uL (ref 3.8–10.8)

## 2016-04-10 NOTE — Telephone Encounter (Signed)
-----   Message from Sanda Klein, MD sent at 04/10/2016 12:36 PM EDT ----- Normal labs

## 2016-04-10 NOTE — Telephone Encounter (Signed)
Called patient with results. Patient verbalized understanding. Patient wanted to know if she could stop vitamin b12 and iron. Per verbal discussion with MCr - patient can stop vitamin b12 and iron. Record updated.

## 2016-04-13 ENCOUNTER — Ambulatory Visit (HOSPITAL_BASED_OUTPATIENT_CLINIC_OR_DEPARTMENT_OTHER)
Admission: RE | Admit: 2016-04-13 | Discharge: 2016-04-13 | Disposition: A | Discharge status: Home / Self Care | Payer: Medicare Other | Source: Ambulatory Visit | Attending: Family Medicine | Admitting: Family Medicine

## 2016-04-13 ENCOUNTER — Ambulatory Visit (INDEPENDENT_AMBULATORY_CARE_PROVIDER_SITE_OTHER): Payer: Medicare Other | Admitting: Family Medicine

## 2016-04-13 ENCOUNTER — Encounter: Payer: Self-pay | Admitting: Family Medicine

## 2016-04-13 VITALS — BP 120/70 | HR 66 | Temp 98.3°F | Ht 65.0 in | Wt 143.8 lb

## 2016-04-13 DIAGNOSIS — M4854XA Collapsed vertebra, not elsewhere classified, thoracic region, initial encounter for fracture: Secondary | ICD-10-CM | POA: Insufficient documentation

## 2016-04-13 DIAGNOSIS — J181 Lobar pneumonia, unspecified organism: Secondary | ICD-10-CM

## 2016-04-13 DIAGNOSIS — J9811 Atelectasis: Secondary | ICD-10-CM | POA: Diagnosis not present

## 2016-04-13 DIAGNOSIS — J189 Pneumonia, unspecified organism: Secondary | ICD-10-CM

## 2016-04-13 DIAGNOSIS — R938 Abnormal findings on diagnostic imaging of other specified body structures: Secondary | ICD-10-CM | POA: Insufficient documentation

## 2016-04-13 DIAGNOSIS — R0609 Other forms of dyspnea: Secondary | ICD-10-CM | POA: Diagnosis not present

## 2016-04-13 DIAGNOSIS — R0602 Shortness of breath: Secondary | ICD-10-CM | POA: Diagnosis not present

## 2016-04-13 DIAGNOSIS — K449 Diaphragmatic hernia without obstruction or gangrene: Secondary | ICD-10-CM | POA: Insufficient documentation

## 2016-04-13 MED ORDER — AZITHROMYCIN 250 MG PO TABS: ORAL_TABLET | ORAL | 0 refills | Status: DC

## 2016-04-13 NOTE — Progress Notes (Signed)
Chief Complaint  Patient presents with  . Painful breathing    started on last Friday and along with SOB    Subjective: Patient is a 81 y.o. female here for SOB.  Pt had SOB when exerting herself starting on Friday. She is fine when she lies down or sitting. She has had a dry cough and notes a low grade fever when not taking Tylenol. Denies chest pain or palpitations. Had normal EKG done on 10/3. No sick contacts. No hx of smoking, COPD or asthma.   ROS: Heart: Denies chest pain or palpitations Lungs:+SOB, +dry cough   Family History  Problem Relation Age of Onset  . Stroke Mother   . Stroke Father   . Heart disease Daughter     congenital "whole in her heart"  . Lymphoma Daughter     nonhodgkins  . Breast cancer Sister   . Heart disease Brother    Past Medical History:  Diagnosis Date  . Acute bronchitis 10/09/2012  . Anemia    pt denies  . Arthritis   . Atrial fibrillation ([place])   . Cataract    bil  . Cystocele    history with rectocele, pt denies  . Dysrhythmia   . GERD (gastroesophageal reflux disease)   . H/O hiatal hernia   . Hemorrhoid   . History of pancreatitis    pt denies  . Hypothyroidism   . Non Hodgkin's lymphoma ([place])   . Osteoporosis   . Pacemaker 09/15/2011   Medtronic Revo  . Pericarditis   . Pleural effusion, bilateral    history of  . Pulmonary nodules    history of  . Scoliosis   . Shortness of breath    with exertion  . Varicose vein    Allergies  Allergen Reactions  . Amoxicillin Hives    Hives  . Buprenorphine Hives  . Codeine Hives    REACTION: "makes her crazy" Hives  . Demerol Hives  . Morphine And Related Hives  . Vicodin [Hydrocodone-Acetaminophen] Hives  . Sulfonamide Derivatives Rash    REACTION: Rash    Current Outpatient Prescriptions:  .  acetaminophen (TYLENOL) 500 MG tablet, Take 500 mg by mouth every 6 (six) hours as needed for pain., Disp: , Rfl:  .  ALPRAZolam (XANAX) 0.5 MG tablet, take 1 tablet by mouth  at bedtime if needed for sleep, Disp: 30 tablet, Rfl: 0 .  amLODipine (NORVASC) 5 MG tablet, take 1 tablet by mouth once daily, Disp: 90 tablet, Rfl: 1 .  aspirin 81 MG tablet, Take 81 mg by mouth daily., Disp: , Rfl:  .  baclofen (LIORESAL) 10 MG tablet, , Disp: , Rfl:  .  Calcium Carbonate-Vitamin D (CALTRATE 600+D) 600-400 MG-UNIT per tablet, Take 1 tablet by mouth 2 (two) times daily., Disp: , Rfl:  .  denosumab (PROLIA) 60 MG/ML SOLN injection, Inject 60 mg into the skin every 6 (six) months. Reported on 10/16/2015, Disp: , Rfl:  .  gabapentin (NEURONTIN) 100 MG capsule, take 1 capsule by mouth three times a day, Disp: 90 capsule, Rfl: 0 .  KRILL OIL OMEGA-3 PO, Take 1 capsule by mouth daily., Disp: , Rfl:  .  levothyroxine (SYNTHROID, LEVOTHROID) 100 MCG tablet, take 1 tablet by mouth once daily, Disp: 30 tablet, Rfl: 5 .  pantoprazole (PROTONIX) 40 MG tablet, Take 1 tablet (40 mg total) by mouth daily., Disp: 30 tablet, Rfl: 5 .  azithromycin (ZITHROMAX) 250 MG tablet, Take 2 pills the first day and  1 pill daily until you run out., Disp: 6 tablet, Rfl: 0  Objective: BP 120/70 (BP Location: Left Arm, Patient Position: Sitting, Cuff Size: Normal)   Pulse 66   Temp 98.3 F (36.8 C) (Oral)   Ht 5\' 5"  (1.651 m)   Wt 143 lb 12.8 oz (65.2 kg)   SpO2 94%   BMI 23.93 kg/m  General: Awake, appears stated age HEENT: MMM, no pharyngeal exudate or erythema, EOMi, sclera white, ears neg b/l, nose neg b/l Heart: RRR, no murmurs, no LE edema Lungs: CTAB, no rales, wheezes or rhonchi. No access muscle use Psych: Age appropriate judgment and insight, normal affect and mood  Assessment and Plan: Community acquired pneumonia of right middle lobe of lung ([place]) - Plan: azithromycin (ZITHROMAX) 250 MG tablet  Dyspnea on exertion - Plan: DG Chest 2 View  Orders as above. XR shows blurring of R boarder of heart, will tx for infiltrate. F/u prn. The patient voiced understanding and agreement to the  plan.  [place], DO 04/13/16  12:02 PM

## 2016-04-13 NOTE — Progress Notes (Signed)
Pre visit review using our clinic review tool, if applicable. No additional management support is needed unless otherwise documented below in the visit note. 

## 2016-04-15 ENCOUNTER — Telehealth: Payer: Self-pay | Admitting: *Deleted

## 2016-04-15 NOTE — Telephone Encounter (Signed)
Called pt re: lab note from PCP and pt reports that she continues to be very short of breath and exhausted. Was seen by cardiology on 04/09/16 and has echo scheduled for 04/28/16. Pt also seen by Dr Nani Ravens on 04/13/16 for shortness of breath and placed on azithromycin for possible pneumonia. Pt states shortness of breath is not worse but is not better. Also notes some "twinging around pace maker since starting antibiotic". Pt reports flight to [facility] 2 weeks ago. Pt denies chest pain. Spoke with PA, Hassell Done and he advised office visit tomorrow. Scheduled pt to see Dr Lorelei Pont at 11:15am. Please see CXR from 10/16 and cardiology note from 04/09/16

## 2016-04-16 ENCOUNTER — Telehealth: Payer: Self-pay | Admitting: Cardiology

## 2016-04-16 ENCOUNTER — Ambulatory Visit (INDEPENDENT_AMBULATORY_CARE_PROVIDER_SITE_OTHER): Payer: Medicare Other | Admitting: Family Medicine

## 2016-04-16 ENCOUNTER — Ambulatory Visit (INDEPENDENT_AMBULATORY_CARE_PROVIDER_SITE_OTHER): Payer: Medicare Other | Admitting: *Deleted

## 2016-04-16 ENCOUNTER — Encounter (HOSPITAL_BASED_OUTPATIENT_CLINIC_OR_DEPARTMENT_OTHER): Payer: Self-pay

## 2016-04-16 ENCOUNTER — Other Ambulatory Visit: Payer: Self-pay | Admitting: Family Medicine

## 2016-04-16 ENCOUNTER — Ambulatory Visit (HOSPITAL_BASED_OUTPATIENT_CLINIC_OR_DEPARTMENT_OTHER)
Admission: RE | Admit: 2016-04-16 | Discharge: 2016-04-16 | Disposition: A | Discharge status: Home / Self Care | Payer: Medicare Other | Source: Ambulatory Visit | Attending: Family Medicine | Admitting: Family Medicine

## 2016-04-16 ENCOUNTER — Encounter: Payer: Self-pay | Admitting: Family Medicine

## 2016-04-16 VITALS — BP 115/56 | HR 80 | Temp 98.2°F | Ht 65.0 in | Wt 143.0 lb

## 2016-04-16 DIAGNOSIS — I495 Sick sinus syndrome: Secondary | ICD-10-CM

## 2016-04-16 DIAGNOSIS — R0602 Shortness of breath: Secondary | ICD-10-CM | POA: Insufficient documentation

## 2016-04-16 DIAGNOSIS — K449 Diaphragmatic hernia without obstruction or gangrene: Secondary | ICD-10-CM | POA: Insufficient documentation

## 2016-04-16 DIAGNOSIS — J189 Pneumonia, unspecified organism: Secondary | ICD-10-CM | POA: Diagnosis not present

## 2016-04-16 DIAGNOSIS — R918 Other nonspecific abnormal finding of lung field: Secondary | ICD-10-CM | POA: Insufficient documentation

## 2016-04-16 DIAGNOSIS — I313 Pericardial effusion (noninflammatory): Secondary | ICD-10-CM | POA: Insufficient documentation

## 2016-04-16 DIAGNOSIS — J918 Pleural effusion in other conditions classified elsewhere: Secondary | ICD-10-CM | POA: Diagnosis not present

## 2016-04-16 DIAGNOSIS — R0789 Other chest pain: Secondary | ICD-10-CM

## 2016-04-16 LAB — BASIC METABOLIC PANEL
BUN: 12 mg/dL (ref 6–23)
CHLORIDE: 103 meq/L (ref 96–112)
CO2: 28 meq/L (ref 19–32)
Calcium: 9.7 mg/dL (ref 8.4–10.5)
Creatinine, Ser: 0.7 mg/dL (ref 0.40–1.20)
GFR: 85.63 mL/min (ref >60.00)
GLUCOSE: 91 mg/dL (ref 70–99)
POTASSIUM: 3.6 meq/L (ref 3.5–5.1)
Sodium: 141 mEq/L (ref 135–145)

## 2016-04-16 LAB — BRAIN NATRIURETIC PEPTIDE: Pro B Natriuretic peptide (BNP): 119 pg/mL — ABNORMAL HIGH (ref 0.0–100.0)

## 2016-04-16 LAB — TROPONIN I: TNIDX: 0.01 ug/l (ref 0.00–0.06)

## 2016-04-16 MED ORDER — FUROSEMIDE 20 MG PO TABS: 20.0000 mg | ORAL_TABLET | Freq: Every day | ORAL | 0 refills | Status: DC

## 2016-04-16 MED ORDER — IOPAMIDOL (ISOVUE-370) INJECTION 76%
100.0000 mL | Freq: Once | INTRAVENOUS | Status: AC | PRN
  Administered 2016-04-16: 100 mL via INTRAVENOUS

## 2016-04-16 NOTE — Telephone Encounter (Signed)
Spoke with pt and reminded pt of remote transmission that is due today. Pt verbalized understanding.   

## 2016-04-16 NOTE — Progress Notes (Signed)
Pre visit review using our clinic review tool, if applicable. No additional management support is needed unless otherwise documented below in the visit note. 

## 2016-04-16 NOTE — Patient Instructions (Addendum)
Please go by the lab and then to the imaging dept on the ground floor.  When they finish your CT scan please come back up to clinic and let the staff know you are back to see me   I will be in touch with your other labs later on today/ tomorrow.  For the time being we are doing to try giving you a low dose of a fluid pill to try and "dry out" your lungs. Take the lasix twice daily- morning and lunch time. Just take 1 dose today.  I would also recommend that you not take your amlodipine while you are on the lasix so your blood pressure does not get too low.   Come and see me on Monday- 11:30 am appt  I will check on your over the weekend but if you are getting worse please seek care at the ER!

## 2016-04-16 NOTE — Progress Notes (Addendum)
[place] at [facility] [address], [place], [place] [address] [phone]  Date:  04/16/2016   Name:  [name]   DOB:  [birth year]   MRN:  [id number]  PCP:  [name], NP    Chief Complaint: Shortness of Breath (c/o sob that is with exertion x 1 week. Pt states that she's not sure if she feels chest pain but has noticed some change around her pacemaker.  )   History of Present Illness:  [name] is a 81 y.o. very pleasant female patient who presents with the following:  Here today as a work-in patient.  She was here on 10/16 and was started on azithromycin for possible infiltrate seen on chest film  Dg Chest 2 View  Result Date: 04/13/2016 CLINICAL DATA:  Shortness of breath. History of non-Hodgkin's lymphoma. EXAM: CHEST  2 VIEW COMPARISON:  Chest radiograph July 18, 2014; PET-CT November 28, 2014 FINDINGS: There is generalized interstitial prominence, not present on prior studies. There is chronic atelectasis in the left base. Lungs elsewhere clear. Heart size and pulmonary vascularity are within normal limits. There is a moderate hiatal hernia. Pacemaker leads are attached to the right atrium right ventricle. There is thoracolumbar levoscoliosis. There are no blastic or lytic bone lesions. There is anterior wedging of several lower thoracic vertebral bodies with diffuse osteoporosis, stable. IMPRESSION: No diffuse interstitial prominence, not present previously. Question etiology. This finding potentially could indicate a degree of underlying congestive heart failure. Also could represent allergic type phenomenon, possibly due to medication reaction. Atypical infectious pneumonitis is a third differential consideration. Stable chronic atelectasis left base. Moderate hiatal hernia. Stable cardiac silhouette. No evident adenopathy. Bones osteoporotic with compression fractures in the lower thoracic spine and  scoliosis, stable. Stable pacemaker lead positions. Electronically Signed   By: Lowella Grip III M.D.   On: 04/13/2016 12:55   She last saw her cardiologist Dr. Loletha Grayer on 10/12- HPI as follows:   [name] is a 81 y.o. female with sinus node dysfunction and implantation of a dual-chamber permanent pacemaker (Medtronic Revo 2013, programmed AAIR to avoid ventricular paced beats), non-Hodgkin's lymphoma in remission since with complaints of exertional dyspnea, leg edema, cough and occasional wheezing that has occurred in the last 2 weeks. In fact the symptoms were worse about a week ago and have improved, she still has dyspnea with light activity such as getting dressed. She is also had some brief episodes of chest pain occurring at rest described as a mild discomfort. She does not notice any worsening of the discomfort with activity. Denies changes in her diet, recent travel, febrile illness or other intercurrent health problems. She did notice that she had increased pulsatility in her neck during the same period of time.  She has completed treatment with Rituxan for non-Hodgkin's lymphoma and that is now under observation only. He is still taking iron and vitamin B12 supplements, but a hemoglobin checked in June was normal at 15.1. Routine metabolic panel and TSH checked last week was normal.  Pacemaker interrogation shows normal device function. Presenting rhythm is atrial paced ventricular sensed. Battery voltage is 2.99 V (ERI 2.81 V). Lead parameters remain excellent. She has 81% atrial pacing and only 19% atrial sensing on the average. Interestingly, around the time of her symptoms within the last 2 weeks there was a drastic increase in her average daytime and nighttime heart rates to around the 100 bpm and  a marked reduction in the need for atrial pacing. This seems to be returning to baseline fairly rapidly. No atrial arrhythmia has been recorded. A 6 beat nonsustained VT episode occurred on  September 15 and she has had similar episodes sporadically through the years  Most recent echo last June- EF 55-60%   Here today because she continues to feel SOB- seems to be getting worse.   She feels worse with "any movement, unless I'm just sitting I'm miserable.'  She has to sit down and "rest for a while when I get dressed."   She does not generally lie flat, but has not noted any orthopnea No LE edema Never told that she had CHF  She does have some cough.  She is not coughing up blood- the cough is dry.    Never had a blood clot.  She did travel in a plane twice over the last 2 weeks however, once to [place] and once to [place] She feels some discomfort in her chest, like "a sharp tingle," she has noted this for more than a week. It will come and go.  This is not worse with activity and she would not describe it as chest pain  She has noted a low grade temp up to about 100 degrees at home, will respond to tylenol. She last measured a low grade fever yesterday.   No chills or body aches No calf swelling but her calves will cramp- this is bilateral, this is not new.   She has not noted improvement with her abx as of yet Wt Readings from Last 3 Encounters:  04/16/16 143 lb (64.9 kg)  04/13/16 143 lb 12.8 oz (65.2 kg)  04/09/16 146 lb (66.2 kg)  she has chemo but never radiatio for her lymphoma  Patient Active Problem List   Diagnosis Date Noted  . Insomnia 10/16/2015  . Grade 3 follicular lymphoma of lymph nodes of neck ([place]) 06/06/2015  . Peripheral neuropathy ([place]) 12/26/2014  . Heart palpitations 06/04/2014  . Cyst in hand 03/14/2014  . Hearing loss 04/27/2013  . Hip pain 04/27/2013  . Need for prophylactic vaccination and inoculation against influenza 04/27/2013  . Multinodular goiter 02/21/2013  . Pacemaker 02/11/2013  . Pericardial effusion after pacemaker, hospitalized at [facility] 09/25/11 09/29/2011  . Dyslipidemia, LDL 136 08/21/2011  . Hiatal hernia, large by  CXR 08/21/2011  . Scoliosis 08/21/2011  . NHL (non-Hodgkin's lymphoma)-recent diagnosis 07/09/2011  . Anxiety 07/09/2011  . Sinus bradycardia, symptomatic, s/p MDT PTVDP 09/15/11 02/11/2011  . Hypertension 01/26/2011  . Osteoporosis 09/20/2009  . DIASTOLIC DYSFUNCTION, on 2D Jan 2011 (grade 1) 08/13/2009  . PAP SMEAR, ABNORMAL 07/04/2009  . BACK PAIN 12/07/2008  . THYROID NODULE, RIGHT 10/30/2008  . Hypothyroidism 10/30/2008  . ADJUSTMENT DISORDER WITH DEPRESSED MOOD 10/30/2008  . GERD 10/30/2008    Past Medical History:  Diagnosis Date  . Acute bronchitis 10/09/2012  . Anemia    pt denies  . Arthritis   . Atrial fibrillation ([place])   . Cataract    bil  . Cystocele    history with rectocele, pt denies  . Dysrhythmia   . GERD (gastroesophageal reflux disease)   . H/O hiatal hernia   . Hemorrhoid   . History of pancreatitis    pt denies  . Hypothyroidism   . Non Hodgkin's lymphoma ([place])   . Osteoporosis   . Pacemaker 09/15/2011   Medtronic Revo  . Pericarditis   . Pleural effusion, bilateral    history  of  . Pulmonary nodules    history of  . Scoliosis   . Shortness of breath    with exertion  . Varicose vein     Past Surgical History:  Procedure Laterality Date  . ABDOMINAL HYSTERECTOMY  04/29/04   partial  . BREAST SURGERY  07/30/01   biopsy x 3, bilateral  . COLONOSCOPY    . EYE SURGERY     cataracts  . MASS EXCISION Right 10/28/2012   Procedure: Removal of mass on neck       ;  Surgeon: Adin Hector, MD;  Location: [facility];  Service: General;  Laterality: Right;  . PACEMAKER INSERTION  09/15/11   MDT  . PERMANENT PACEMAKER INSERTION N/A 09/15/2011   Procedure: PERMANENT PACEMAKER INSERTION;  Surgeon: Sanda Klein, MD;  Location: [facility];  Service: Cardiovascular;  Laterality: N/A;  . TONSILLECTOMY      Social History  Substance Use Topics  . Smoking status: Never Smoker  . Smokeless tobacco: Never Used  . Alcohol use Yes     Comment: 1 glass of  wine occasionally    Family History  Problem Relation Age of Onset  . Stroke Mother   . Stroke Father   . Heart disease Daughter     congenital "whole in her heart"  . Lymphoma Daughter     nonhodgkins  . Breast cancer Sister   . Heart disease Brother     Allergies  Allergen Reactions  . Amoxicillin Hives    Hives  . Buprenorphine Hives  . Codeine Hives    REACTION: "makes her crazy" Hives  . Demerol Hives  . Morphine And Related Hives  . Vicodin [Hydrocodone-Acetaminophen] Hives  . Sulfonamide Derivatives Rash    REACTION: Rash    Medication list has been reviewed and updated.  Current Outpatient Prescriptions on File Prior to Visit  Medication Sig Dispense Refill  . acetaminophen (TYLENOL) 500 MG tablet Take 500 mg by mouth every 6 (six) hours as needed for pain.    [name] ALPRAZolam (XANAX) 0.5 MG tablet take 1 tablet by mouth at bedtime if needed for sleep 30 tablet 0  . amLODipine (NORVASC) 5 MG tablet take 1 tablet by mouth once daily 90 tablet 1  . aspirin 81 MG tablet Take 81 mg by mouth daily.    [name] azithromycin (ZITHROMAX) 250 MG tablet Take 2 pills the first day and 1 pill daily until you run out. 6 tablet 0  . baclofen (LIORESAL) 10 MG tablet     . Calcium Carbonate-Vitamin D (CALTRATE 600+D) 600-400 MG-UNIT per tablet Take 1 tablet by mouth 2 (two) times daily.    [name] denosumab (PROLIA) 60 MG/ML SOLN injection Inject 60 mg into the skin every 6 (six) months. Reported on 10/16/2015    . gabapentin (NEURONTIN) 100 MG capsule take 1 capsule by mouth three times a day 90 capsule 0  . KRILL OIL OMEGA-3 PO Take 1 capsule by mouth daily.    [name] levothyroxine (SYNTHROID, LEVOTHROID) 100 MCG tablet take 1 tablet by mouth once daily 30 tablet 5  . pantoprazole (PROTONIX) 40 MG tablet Take 1 tablet (40 mg total) by mouth daily. 30 tablet 5   No current facility-administered medications on file prior to visit.     Review of Systems:  As per HPI- otherwise negative. No  vomiting or diarrhea.  No recent medication change  Physical Examination: Vitals:   04/16/16 1122  BP: (!) 115/56  Pulse: 80  Temp: 98.2 F (36.8 C)   Vitals:   04/16/16 1122  Weight: 143 lb (64.9 kg)  Height: 5\' 5"  (1.651 m)   Body mass index is 23.8 kg/m. Ideal Body Weight: Weight in (lb) to have BMI = 25: 149.9  GEN: WDWN, NAD, Non-toxic, A & O x 3, slim build, looks well HEENT: Atraumatic, Normocephalic. Neck supple. No masses, No LAD.  Bilateral TM wnl, oropharynx normal.  PEERL,EOMI.   Ears and Nose: No external deformity. CV: RRR, No M/G/R. No JVD. No thrill. No extra heart sounds. PULM: CTA B, no wheezes, crackles, rhonchi. No retractions. No resp. distress. No accessory muscle use. ABD: S, NT, ND, No rebound. No HSM. EXTR: No c/c/e NEURO Normal gait.  PSYCH: Normally interactive. Conversant. Not depressed or anxious appearing.  Calm demeanor.   EKG: paced rhythm, no concerning ST changes.  Dg Chest 2 View  Result Date: 04/13/2016 CLINICAL DATA:  Shortness of breath. History of non-Hodgkin's lymphoma. EXAM: CHEST  2 VIEW COMPARISON:  Chest radiograph July 18, 2014; PET-CT November 28, 2014 FINDINGS: There is generalized interstitial prominence, not present on prior studies. There is chronic atelectasis in the left base. Lungs elsewhere clear. Heart size and pulmonary vascularity are within normal limits. There is a moderate hiatal hernia. Pacemaker leads are attached to the right atrium right ventricle. There is thoracolumbar levoscoliosis. There are no blastic or lytic bone lesions. There is anterior wedging of several lower thoracic vertebral bodies with diffuse osteoporosis, stable. IMPRESSION: No diffuse interstitial prominence, not present previously. Question etiology. This finding potentially could indicate a degree of underlying congestive heart failure. Also could represent allergic type phenomenon, possibly due to medication reaction. Atypical infectious pneumonitis  is a third differential consideration. Stable chronic atelectasis left base. Moderate hiatal hernia. Stable cardiac silhouette. No evident adenopathy. Bones osteoporotic with compression fractures in the lower thoracic spine and scoliosis, stable. Stable pacemaker lead positions. Electronically Signed   By: Lowella Grip III M.D.   On: 04/13/2016 12:55   Ct Angio Chest Pe W Or Wo Contrast  Result Date: 04/16/2016 CLINICAL DATA:  Shortness of breath.  Rule out pulmonary embolism. EXAM: CT ANGIOGRAPHY CHEST WITH CONTRAST TECHNIQUE: Multidetector CT imaging of the chest was performed using the standard protocol during bolus administration of intravenous contrast. Multiplanar CT image reconstructions and MIPs were obtained to evaluate the vascular anatomy. CONTRAST:  100 cc Isovue 370 intravenous COMPARISON:  12/11/2013 FINDINGS: Cardiovascular: Satisfactory opacification of the pulmonary arteries to the segmental level. No evidence of pulmonary embolism. Normal heart size. Small pericardial effusion without pericardial enhancement. Dual-chamber pacer leads from the left. Mediastinum/Nodes: Negative esophagus. Moderate sliding hiatal hernia. No adenopathy. Lungs/Pleura: There is diffuse peripheral ground-glass opacity and reticular markings. Patchy areas of air trapping seen at the bases. No craniocaudal gradient or honeycombing. This is new from 2015 chest CT and 2016 PET-CT. There are small scattered peripheral pulmonary nodules that are chronic and benign. Trace pleural effusions. Upper Abdomen: No acute finding Musculoskeletal: Severe thoracolumbar scoliosis. Review of the MIP images confirms the above findings. IMPRESSION: 1. Negative for pulmonary embolism. 2. Acute opacities which could reflect atypical infection, drug reaction, or non fibrotic inflammatory pneumonia. Edema is not favored, but there are trace pleural effusions and would correlate for clinical volume overload. 3. Small pericardial  effusion. 4. Moderate hiatal hernia Electronically Signed   By: Monte Fantasia M.D.   On: 04/16/2016 13:15    Assessment and Plan: Shortness of breath - Plan: EKG 12-Lead, CT ANGIO  CHEST PE W OR WO CONTRAST, Troponin I, B Nat Peptide  Chest discomfort - Plan: EKG 12-Lead, Troponin I, Basic metabolic panel, B Nat Peptide  Pleural effusion associated with pulmonary infection - Plan: furosemide (LASIX) 20 MG tablet  Here today with persistent SOB.  She does have a pacemaker, but her sx do not seem cardiac in origin.  No ST changes on EKG today and her sx are more SOB, not complaining of CP Will get CT angio due to concern of PE.  Had pt come back up to clinic after CT- negative for PE, but she does have:Acute opacities which could reflect atypical infection, drug reaction, or non fibrotic inflammatory pneumonia. Edema is not favored, but there are trace pleural effusions and would correlate for clinical volume overload. 3. Small pericardial effusion.  At this time it does appear that her lungs are wet and she may benefit from gently diuresis and close follow-up.  Start on lasix 20 BID. Await further labs as above  Results for orders placed or performed in visit on 04/16/16  Troponin I  Result Value Ref Range   TNIDX 0.01 0.00 - 0.06 ug/l  Basic metabolic panel  Result Value Ref Range   Sodium 141 135 - 145 mEq/L   Potassium 3.6 3.5 - 5.1 mEq/L   Chloride 103 96 - 112 mEq/L   CO2 28 19 - 32 mEq/L   Glucose, Bld 91 70 - 99 mg/dL   BUN 12 6 - 23 mg/dL   Creatinine, Ser 0.70 0.40 - 1.20 mg/dL   Calcium 9.7 8.4 - 10.5 mg/dL   GFR 85.63 >60.00 mL/min  B Nat Peptide  Result Value Ref Range   Pro B Natriuretic peptide (BNP) 119.0 (H) 0.0 - 100.0 pg/mL   Signed Lamar Blinks, MD  Called to check on her progress 10/21- LMOM that I was just calling to check on her  Called her on 10/22- she is feeling better, breathing is improved.  Good news, will see her tomorrow in clinic

## 2016-04-17 ENCOUNTER — Encounter: Payer: Self-pay | Admitting: Cardiology

## 2016-04-17 NOTE — Progress Notes (Signed)
Remote pacemaker transmission.   

## 2016-04-20 ENCOUNTER — Ambulatory Visit (INDEPENDENT_AMBULATORY_CARE_PROVIDER_SITE_OTHER): Payer: Medicare Other | Admitting: Family Medicine

## 2016-04-20 ENCOUNTER — Ambulatory Visit (HOSPITAL_BASED_OUTPATIENT_CLINIC_OR_DEPARTMENT_OTHER)
Admission: RE | Admit: 2016-04-20 | Discharge: 2016-04-20 | Disposition: A | Discharge status: Home / Self Care | Payer: Medicare Other | Source: Ambulatory Visit | Attending: Family Medicine | Admitting: Family Medicine

## 2016-04-20 ENCOUNTER — Encounter: Payer: Self-pay | Admitting: Family Medicine

## 2016-04-20 VITALS — BP 112/62 | HR 83 | Ht 65.0 in | Wt 140.2 lb

## 2016-04-20 DIAGNOSIS — Z5181 Encounter for therapeutic drug level monitoring: Secondary | ICD-10-CM | POA: Diagnosis not present

## 2016-04-20 DIAGNOSIS — J189 Pneumonia, unspecified organism: Secondary | ICD-10-CM

## 2016-04-20 DIAGNOSIS — J918 Pleural effusion in other conditions classified elsewhere: Secondary | ICD-10-CM

## 2016-04-20 DIAGNOSIS — J811 Chronic pulmonary edema: Secondary | ICD-10-CM | POA: Diagnosis not present

## 2016-04-20 DIAGNOSIS — K449 Diaphragmatic hernia without obstruction or gangrene: Secondary | ICD-10-CM | POA: Diagnosis not present

## 2016-04-20 DIAGNOSIS — D809 Immunodeficiency with predominantly antibody defects, unspecified: Secondary | ICD-10-CM | POA: Diagnosis not present

## 2016-04-20 LAB — COMPREHENSIVE METABOLIC PANEL
ALT: 11 U/L (ref 0–35)
AST: 15 U/L (ref 0–37)
Albumin: 4.2 g/dL (ref 3.5–5.2)
Alkaline Phosphatase: 83 U/L (ref 39–117)
BILIRUBIN TOTAL: 0.5 mg/dL (ref 0.2–1.2)
BUN: 19 mg/dL (ref 6–23)
CO2: 31 meq/L (ref 19–32)
CREATININE: 0.85 mg/dL (ref 0.40–1.20)
Calcium: 10.2 mg/dL (ref 8.4–10.5)
Chloride: 102 mEq/L (ref 96–112)
GFR: 68.44 mL/min (ref >60.00)
GLUCOSE: 93 mg/dL (ref 70–99)
Potassium: 3.8 mEq/L (ref 3.5–5.1)
Sodium: 142 mEq/L (ref 135–145)
Total Protein: 6.9 g/dL (ref 6.0–8.3)

## 2016-04-20 NOTE — Patient Instructions (Signed)
Please have your blood drawn and then go to the imaging dept on the ground floor for a repeat chest x-ray.   Stop using the furosemide at this time Please keep me posted about how your are doing!

## 2016-04-20 NOTE — Progress Notes (Signed)
Pre visit review using our clinic review tool, if applicable. No additional management support is needed unless otherwise documented below in the visit note. 

## 2016-04-20 NOTE — Progress Notes (Signed)
[name] at [facility] [address] [place], [place] [address] [id number] [phone]  Date:  04/20/2016   Name:  [name]   DOB:  Dec 06, 1934   MRN:  [id number]  PCP:  [name], NP    Chief Complaint: Follow-up (Pt here for f/u visit. Pt states that she feels about the same. )   History of Present Illness:  [name] is a 81 y.o. very pleasant female patient who presents with the following:  Seen her on 10/19 for SOB- she appeared to have some fluid in her lungs. We used gentle lasix diuresis She is not eating much; does not have much appetite She has finished her azithromycin No vomiting or diarrhea, she has been nauseated but has not thrown up No further low grade temps   Her daughter has a primary immune def and they wonder if Tomi has it too.  She is to have an immunology visit tomorrow am and they would like to check on her IG levels today if we can.  They did check these in the past and they were borderline but now low enough for treatment with IVIG per medicare guidelines Wt Readings from Last 3 Encounters:  04/20/16 140 lb 3.2 oz (63.6 kg)  04/16/16 143 lb (64.9 kg)  04/13/16 143 lb 12.8 oz (65.2 kg)   (Daughter advised me that they do have some inhaled tobramycin that they can use if needed)  Patient Active Problem List   Diagnosis Date Noted  . Insomnia 10/16/2015  . Grade 3 follicular lymphoma of lymph nodes of neck ([place]) 06/06/2015  . Peripheral neuropathy ([place]) 12/26/2014  . Heart palpitations 06/04/2014  . Cyst in hand 03/14/2014  . Hearing loss 04/27/2013  . Hip pain 04/27/2013  . Need for prophylactic vaccination and inoculation against influenza 04/27/2013  . Multinodular goiter 02/21/2013  . Pacemaker 02/11/2013  . Pericardial effusion after pacemaker, hospitalized at [facility] 09/25/11 09/29/2011  . Dyslipidemia, LDL 136 08/21/2011  . Hiatal hernia, large by CXR 08/21/2011  .  Scoliosis 08/21/2011  . NHL (non-Hodgkin's lymphoma)-recent diagnosis 07/09/2011  . Anxiety 07/09/2011  . Sinus bradycardia, symptomatic, s/p MDT PTVDP 09/15/11 02/11/2011  . Hypertension 01/26/2011  . Osteoporosis 09/20/2009  . DIASTOLIC DYSFUNCTION, on 2D Jan 2011 (grade 1) 08/13/2009  . PAP SMEAR, ABNORMAL 07/04/2009  . BACK PAIN 12/07/2008  . THYROID NODULE, RIGHT 10/30/2008  . Hypothyroidism 10/30/2008  . ADJUSTMENT DISORDER WITH DEPRESSED MOOD 10/30/2008  . GERD 10/30/2008    Past Medical History:  Diagnosis Date  . Acute bronchitis 10/09/2012  . Anemia    pt denies  . Arthritis   . Atrial fibrillation ([place])   . Cataract    bil  . Cystocele    history with rectocele, pt denies  . Dysrhythmia   . GERD (gastroesophageal reflux disease)   . H/O hiatal hernia   . Hemorrhoid   . History of pancreatitis    pt denies  . Hypothyroidism   . Non Hodgkin's lymphoma ([place])   . Osteoporosis   . Pacemaker 09/15/2011   Medtronic Revo  . Pericarditis   . Pleural effusion, bilateral    history of  . Pulmonary nodules    history of  . Scoliosis   . Shortness of breath    with exertion  . Varicose vein     Past Surgical History:  Procedure Laterality Date  . ABDOMINAL HYSTERECTOMY  04/29/04   partial  .  BREAST SURGERY  07/30/01   biopsy x 3, bilateral  . COLONOSCOPY    . EYE SURGERY     cataracts  . MASS EXCISION Right 10/28/2012   Procedure: Removal of mass on neck       ;  Surgeon: Adin Hector, MD;  Location: [facility];  Service: General;  Laterality: Right;  . PACEMAKER INSERTION  09/15/11   MDT  . PERMANENT PACEMAKER INSERTION N/A 09/15/2011   Procedure: PERMANENT PACEMAKER INSERTION;  Surgeon: Sanda Klein, MD;  Location: [facility] CATH LAB;  Service: Cardiovascular;  Laterality: N/A;  . TONSILLECTOMY      Social History  Substance Use Topics  . Smoking status: Never Smoker  . Smokeless tobacco: Never Used  . Alcohol use Yes     Comment: 1 glass of wine occasionally     Family History  Problem Relation Age of Onset  . Stroke Mother   . Stroke Father   . Heart disease Daughter     congenital "whole in her heart"  . Lymphoma Daughter     nonhodgkins  . Breast cancer Sister   . Heart disease Brother     Allergies  Allergen Reactions  . Amoxicillin Hives    Hives  . Buprenorphine Hives  . Codeine Hives    REACTION: "makes her crazy" Hives  . Demerol Hives  . Morphine And Related Hives  . Vicodin [Hydrocodone-Acetaminophen] Hives  . Sulfonamide Derivatives Rash    REACTION: Rash    Medication list has been reviewed and updated.  Current Outpatient Prescriptions on File Prior to Visit  Medication Sig Dispense Refill  . acetaminophen (TYLENOL) 500 MG tablet Take 500 mg by mouth every 6 (six) hours as needed for pain.    [name] ALPRAZolam (XANAX) 0.5 MG tablet take 1 tablet by mouth at bedtime if needed for sleep 30 tablet 0  . amLODipine (NORVASC) 5 MG tablet take 1 tablet by mouth once daily 90 tablet 1  . aspirin 81 MG tablet Take 81 mg by mouth daily.    [name] azithromycin (ZITHROMAX) 250 MG tablet Take 2 pills the first day and 1 pill daily until you run out. 6 tablet 0  . baclofen (LIORESAL) 10 MG tablet     . Calcium Carbonate-Vitamin D (CALTRATE 600+D) 600-400 MG-UNIT per tablet Take 1 tablet by mouth 2 (two) times daily.    [name] denosumab (PROLIA) 60 MG/ML SOLN injection Inject 60 mg into the skin every 6 (six) months. Reported on 10/16/2015    . furosemide (LASIX) 20 MG tablet Take 1 tablet (20 mg total) by mouth daily. Take 1 pill twice daily 30 tablet 0  . gabapentin (NEURONTIN) 100 MG capsule take 1 capsule by mouth three times a day 90 capsule 0  . KRILL OIL OMEGA-3 PO Take 1 capsule by mouth daily.    [name] levothyroxine (SYNTHROID, LEVOTHROID) 100 MCG tablet take 1 tablet by mouth once daily 30 tablet 5  . pantoprazole (PROTONIX) 40 MG tablet Take 1 tablet (40 mg total) by mouth daily. 30 tablet 5   No current facility-administered  medications on file prior to visit.     Review of Systems:  As per HPI- otherwise negative.   Physical Examination: Vitals:   04/20/16 1131  BP: 112/62  Pulse: 83   Vitals:   04/20/16 1131  Weight: 140 lb 3.2 oz (63.6 kg)  Height: 5\' 5"  (1.651 m)   Body mass index is 23.33 kg/m. Ideal Body Weight: Weight in (lb) to  have BMI = 25: 149.9  GEN: WDWN, NAD, Non-toxic, A & O x 3, slight build, here with her daughter today.  Looks well HEENT: Atraumatic, Normocephalic. Neck supple. No masses, No LAD. Ears and Nose: No external deformity. CV: RRR, No M/G/R. No JVD. No thrill. No extra heart sounds. PULM: CTA B, no wheezes, crackles, rhonchi. No retractions. No resp. distress. No accessory muscle use.  Lungs sound fine today ABD: S, NT, ND, +BS. No rebound. No HSM. EXTR: No c/c/e NEURO Normal gait.  PSYCH: Normally interactive. Conversant. Not depressed or anxious appearing.  Calm demeanor.   Sat 95% at last visit   Results for orders placed or performed in visit on 04/20/16  Comprehensive metabolic panel  Result Value Ref Range   Sodium 142 135 - 145 mEq/L   Potassium 3.8 3.5 - 5.1 mEq/L   Chloride 102 96 - 112 mEq/L   CO2 31 19 - 32 mEq/L   Glucose, Bld 93 70 - 99 mg/dL   BUN 19 6 - 23 mg/dL   Creatinine, Ser 0.85 0.40 - 1.20 mg/dL   Total Bilirubin 0.5 0.2 - 1.2 mg/dL   Alkaline Phosphatase 83 39 - 117 U/L   AST 15 0 - 37 U/L   ALT 11 0 - 35 U/L   Total Protein 6.9 6.0 - 8.3 g/dL   Albumin 4.2 3.5 - 5.2 g/dL   Calcium 10.2 8.4 - 10.5 mg/dL   GFR 68.44 >60.00 mL/min   Dg Chest 2 View  Result Date: 04/20/2016 CLINICAL DATA:  Pulmonary edema. EXAM: CHEST  2 VIEW COMPARISON:  Chest CT 04/16/2016.  Chest x-ray 04/13/2016. FINDINGS: The lungs are clear wiithout focal pneumonia, edema, pneumothorax or pleural effusion. Interstitial markings are diffusely coarsened with chronic features. Cardiopericardial silhouette is at upper limits of normal for size. Moderate to large  hiatal hernia again noted. Thoracolumbar scoliosis evident. Left-sided permanent pacemaker remains in place. IMPRESSION: Chronic interstitial changes without evidence for overt pulmonary edema. Moderate hiatal hernia. Electronically Signed   By: Misty Stanley M.D.   On: 04/20/2016 12:27   Dg Chest 2 View  Result Date: 04/13/2016 CLINICAL DATA:  Shortness of breath. History of non-Hodgkin's lymphoma. EXAM: CHEST  2 VIEW COMPARISON:  Chest radiograph July 18, 2014; PET-CT November 28, 2014 FINDINGS: There is generalized interstitial prominence, not present on prior studies. There is chronic atelectasis in the left base. Lungs elsewhere clear. Heart size and pulmonary vascularity are within normal limits. There is a moderate hiatal hernia. Pacemaker leads are attached to the right atrium right ventricle. There is thoracolumbar levoscoliosis. There are no blastic or lytic bone lesions. There is anterior wedging of several lower thoracic vertebral bodies with diffuse osteoporosis, stable. IMPRESSION: No diffuse interstitial prominence, not present previously. Question etiology. This finding potentially could indicate a degree of underlying congestive heart failure. Also could represent allergic type phenomenon, possibly due to medication reaction. Atypical infectious pneumonitis is a third differential consideration. Stable chronic atelectasis left base. Moderate hiatal hernia. Stable cardiac silhouette. No evident adenopathy. Bones osteoporotic with compression fractures in the lower thoracic spine and scoliosis, stable. Stable pacemaker lead positions. Electronically Signed   By: Lowella Grip III M.D.   On: 04/13/2016 12:55   Ct Angio Chest Pe W Or Wo Contrast  Result Date: 04/16/2016 CLINICAL DATA:  Shortness of breath.  Rule out pulmonary embolism. EXAM: CT ANGIOGRAPHY CHEST WITH CONTRAST TECHNIQUE: Multidetector CT imaging of the chest was performed using the standard protocol during bolus  administration  of intravenous contrast. Multiplanar CT image reconstructions and MIPs were obtained to evaluate the vascular anatomy. CONTRAST:  100 cc Isovue 370 intravenous COMPARISON:  12/11/2013 FINDINGS: Cardiovascular: Satisfactory opacification of the pulmonary arteries to the segmental level. No evidence of pulmonary embolism. Normal heart size. Small pericardial effusion without pericardial enhancement. Dual-chamber pacer leads from the left. Mediastinum/Nodes: Negative esophagus. Moderate sliding hiatal hernia. No adenopathy. Lungs/Pleura: There is diffuse peripheral ground-glass opacity and reticular markings. Patchy areas of air trapping seen at the bases. No craniocaudal gradient or honeycombing. This is new from 2015 chest CT and 2016 PET-CT. There are small scattered peripheral pulmonary nodules that are chronic and benign. Trace pleural effusions. Upper Abdomen: No acute finding Musculoskeletal: Severe thoracolumbar scoliosis. Review of the MIP images confirms the above findings. IMPRESSION: 1. Negative for pulmonary embolism. 2. Acute opacities which could reflect atypical infection, drug reaction, or non fibrotic inflammatory pneumonia. Edema is not favored, but there are trace pleural effusions and would correlate for clinical volume overload. 3. Small pericardial effusion. 4. Moderate hiatal hernia Electronically Signed   By: Monte Fantasia M.D.   On: 04/16/2016 13:15    Assessment and Plan: Pleural effusion associated with pulmonary infection - Plan: DG Chest 2 View  Immunoglobulin deficiency (HCC) - Plan: IgG, IgA, IgM  Medication monitoring encounter - Plan: Comprehensive metabolic panel  Here today for a recheck visit. She is somewhat better but still not feeling well. She is down 3lbs and her lungs are clear,  Will stop lasix.   Called pt when her results came in; her lungs appear to be improved. She has stopped the lasix. We will get her echo next week- assuming this is normal I  would suggest that she see pulmonology for ILD. She will keep me posted in the meantime   Signed Lamar Blinks, MD

## 2016-04-21 DIAGNOSIS — D801 Nonfamilial hypogammaglobulinemia: Secondary | ICD-10-CM | POA: Insufficient documentation

## 2016-04-21 LAB — IGG, IGA, IGM
IGA: 67 mg/dL — AB (ref 81–463)
IgG (Immunoglobin G), Serum: 604 mg/dL — ABNORMAL LOW (ref 694–1618)
IgM, Serum: 14 mg/dL — ABNORMAL LOW (ref 48–271)

## 2016-04-22 ENCOUNTER — Encounter: Payer: Self-pay | Admitting: Family Medicine

## 2016-04-22 ENCOUNTER — Ambulatory Visit: Payer: Medicare Other | Admitting: Cardiovascular Disease

## 2016-04-23 NOTE — Telephone Encounter (Signed)
Patient was seen on 04/16/16    KP

## 2016-04-28 ENCOUNTER — Ambulatory Visit (HOSPITAL_COMMUNITY): Payer: Medicare Other | Attending: Cardiology

## 2016-04-28 ENCOUNTER — Other Ambulatory Visit: Payer: Self-pay

## 2016-04-28 DIAGNOSIS — R0602 Shortness of breath: Secondary | ICD-10-CM

## 2016-04-28 DIAGNOSIS — E785 Hyperlipidemia, unspecified: Secondary | ICD-10-CM | POA: Insufficient documentation

## 2016-04-28 DIAGNOSIS — I358 Other nonrheumatic aortic valve disorders: Secondary | ICD-10-CM | POA: Insufficient documentation

## 2016-04-28 DIAGNOSIS — R0600 Dyspnea, unspecified: Secondary | ICD-10-CM | POA: Diagnosis present

## 2016-04-28 DIAGNOSIS — I313 Pericardial effusion (noninflammatory): Secondary | ICD-10-CM | POA: Diagnosis not present

## 2016-04-28 DIAGNOSIS — I10 Essential (primary) hypertension: Secondary | ICD-10-CM | POA: Diagnosis not present

## 2016-04-30 ENCOUNTER — Telehealth: Payer: Self-pay

## 2016-04-30 DIAGNOSIS — J189 Pneumonia, unspecified organism: Secondary | ICD-10-CM

## 2016-04-30 DIAGNOSIS — J918 Pleural effusion in other conditions classified elsewhere: Principal | ICD-10-CM

## 2016-04-30 MED ORDER — POTASSIUM CHLORIDE ER 10 MEQ PO TBCR: 10.0000 meq | EXTENDED_RELEASE_TABLET | Freq: Every day | ORAL | 3 refills | Status: DC

## 2016-04-30 MED ORDER — FUROSEMIDE 20 MG PO TABS
ORAL_TABLET | ORAL | 3 refills | Status: DC
Start: 2016-04-30 — End: 2016-05-29

## 2016-04-30 NOTE — Telephone Encounter (Signed)
Notes Recorded by Dionne Bucy Truitt, CMA on 04/30/2016 at 12:24 PM EDT Called patient with results. Patient verbalized understanding and agreed with plan.  Prescriptions sent to preferred pharmacy electronically.  Patient states "flutters" have become more frequent and bothersome. Advised that patient send another transmission. ------  Notes Recorded by Sanda Klein, MD on 04/28/2016 at 12:09 PM EDT Normal heart pumping function, but the echo does suggest that there may be excess fluid in the lung circulation due to trouble with heart muscle relaxation. The echo suggests that there is still too much fluid even after the diuretics. Recommend increasing the furosemide to a total of 60 mg daily (for example 40 mg in a.m. and 20 mg in early afternoon). At that dose probably need to add a potassium supplement, potassium chloride 10 mEq daily. Waiting for the remote pacemaker check. Rhythm was normal during the echo.

## 2016-05-01 ENCOUNTER — Encounter: Payer: Self-pay | Admitting: Cardiovascular Disease

## 2016-05-05 ENCOUNTER — Telehealth: Payer: Self-pay

## 2016-05-05 MED ORDER — DILTIAZEM HCL ER COATED BEADS 180 MG PO CP24: 180.0000 mg | ORAL_CAPSULE | Freq: Every day | ORAL | 11 refills | Status: DC

## 2016-05-05 NOTE — Telephone Encounter (Signed)
-----   Message from Sanda Klein, MD sent at 05/05/2016  2:14 PM EST ----- Please let her know pacemaker function is normal and that there have been no rhythm abnormality since the previous check in October.

## 2016-05-05 NOTE — Telephone Encounter (Signed)
Called patient with transmission results.  Patient insisted that she was "still very sick" and her heart flutters more frequently. See telephone encounter from 04/30/16. Discussed situation with MCr - suggested to make medication changes. STOP Amlodipine. START Diltiazem 180 mg once daily. Patient verbalized understanding and agreed with plan. Diltiazem 180 mg sent to patient's preferred pharmacy electronically.

## 2016-05-06 ENCOUNTER — Other Ambulatory Visit: Payer: Self-pay

## 2016-05-06 MED ORDER — LEVOTHYROXINE SODIUM 100 MCG PO TABS: 100.0000 ug | ORAL_TABLET | Freq: Every day | ORAL | 1 refills | Status: DC

## 2016-05-07 DIAGNOSIS — M791 Myalgia: Secondary | ICD-10-CM | POA: Diagnosis not present

## 2016-05-07 DIAGNOSIS — M47816 Spondylosis without myelopathy or radiculopathy, lumbar region: Secondary | ICD-10-CM | POA: Diagnosis not present

## 2016-05-07 DIAGNOSIS — M47812 Spondylosis without myelopathy or radiculopathy, cervical region: Secondary | ICD-10-CM | POA: Diagnosis not present

## 2016-05-08 ENCOUNTER — Other Ambulatory Visit: Payer: Self-pay | Admitting: Family

## 2016-05-08 ENCOUNTER — Telehealth: Payer: Self-pay | Admitting: Cardiovascular Disease

## 2016-05-08 NOTE — Telephone Encounter (Signed)
New message     Pt c/o of Chest Pain: STAT if CP now or developed within 24 hours  1. Are you having CP right now? Chest discomfort now 2. Are you experiencing any other symptoms (ex. SOB, nausea, vomiting, sweating)? no 3. How long have you been experiencing CP? Started at 3pm today 4. Is your CP continuous or coming and going? continuous 5. Have you taken Nitroglycerin? no?

## 2016-05-08 NOTE — Telephone Encounter (Signed)
Prolia benefits verification submitted 

## 2016-05-08 NOTE — Telephone Encounter (Signed)
Pt is requesting refill on Alprazolam. [facility].  Last OV: 03/31/2016 Last Fill: 03/30/2016 #30 and 0RF UDS: 03/31/2016 Moderate risk  Please advise.

## 2016-05-08 NOTE — Telephone Encounter (Signed)
Spoke with pt states that she is having chest discomfort does not want to call it chest pain denies SOB, chest pain, or diaphoresis. she states she does feel dizzy on and off. This is not a new pacemaker. Pt is not able to take her BP she is at work and monitor is at home. Because of the duration, discomfort and pace maker sent pt to ER she states that she will go.  Trish notified

## 2016-05-08 NOTE — Telephone Encounter (Signed)
Rx faxed to [facility] pharmacy.  

## 2016-05-08 NOTE — Telephone Encounter (Signed)
Ok to refill same quantity and signature. 0 additional refills.

## 2016-05-08 NOTE — Telephone Encounter (Signed)
Rx printed, awaiting PA signature.

## 2016-05-12 DIAGNOSIS — H539 Unspecified visual disturbance: Secondary | ICD-10-CM | POA: Diagnosis not present

## 2016-05-12 DIAGNOSIS — Z885 Allergy status to narcotic agent status: Secondary | ICD-10-CM | POA: Diagnosis not present

## 2016-05-12 DIAGNOSIS — Z48810 Encounter for surgical aftercare following surgery on the sense organs: Secondary | ICD-10-CM | POA: Diagnosis not present

## 2016-05-12 DIAGNOSIS — Z9842 Cataract extraction status, left eye: Secondary | ICD-10-CM | POA: Diagnosis not present

## 2016-05-12 DIAGNOSIS — Z9841 Cataract extraction status, right eye: Secondary | ICD-10-CM | POA: Diagnosis not present

## 2016-05-12 DIAGNOSIS — Z888 Allergy status to other drugs, medicaments and biological substances status: Secondary | ICD-10-CM | POA: Diagnosis not present

## 2016-05-12 DIAGNOSIS — Z79899 Other long term (current) drug therapy: Secondary | ICD-10-CM | POA: Diagnosis not present

## 2016-05-12 DIAGNOSIS — Z886 Allergy status to analgesic agent status: Secondary | ICD-10-CM | POA: Diagnosis not present

## 2016-05-12 DIAGNOSIS — Z7982 Long term (current) use of aspirin: Secondary | ICD-10-CM | POA: Diagnosis not present

## 2016-05-12 DIAGNOSIS — I10 Essential (primary) hypertension: Secondary | ICD-10-CM | POA: Diagnosis not present

## 2016-05-12 DIAGNOSIS — Z880 Allergy status to penicillin: Secondary | ICD-10-CM | POA: Diagnosis not present

## 2016-05-12 DIAGNOSIS — Z961 Presence of intraocular lens: Secondary | ICD-10-CM | POA: Diagnosis not present

## 2016-05-12 DIAGNOSIS — Z882 Allergy status to sulfonamides status: Secondary | ICD-10-CM | POA: Diagnosis not present

## 2016-05-12 LAB — CUP PACEART REMOTE DEVICE CHECK
Date Time Interrogation Session: 20171019200854
Implantable Lead Implant Date: 20130319
Implantable Lead Location: 753860
Lead Channel Impedance Value: 392 Ohm
Lead Channel Setting Sensing Sensitivity: 0.9 mV
MDC IDC LEAD IMPLANT DT: 20130319
MDC IDC LEAD LOCATION: 753859
MDC IDC LEAD MODEL: 5086
MDC IDC LEAD MODEL: 5086
MDC IDC MSMT BATTERY VOLTAGE: 2.99 V
MDC IDC MSMT LEADCHNL RA SENSING INTR AMPL: 2.173 mV
MDC IDC MSMT LEADCHNL RV IMPEDANCE VALUE: 488 Ohm
MDC IDC MSMT LEADCHNL RV SENSING INTR AMPL: 15.009 mV
MDC IDC PG IMPLANT DT: 20130319
MDC IDC SET LEADCHNL RA PACING AMPLITUDE: 2 V
MDC IDC STAT BRADY RA PERCENT PACED: 47.85 %

## 2016-05-20 NOTE — Telephone Encounter (Signed)
Benefits verified No PA required 20% Prolia  $40 Admin or OV fee Patient will owe approximately $240 out of pocket  [name] will you contact patient to get her scheduled and order Prolia?

## 2016-05-24 ENCOUNTER — Other Ambulatory Visit: Payer: Self-pay | Admitting: Family

## 2016-05-26 NOTE — Telephone Encounter (Signed)
Notified pt and scheduled nurse visit for 06/02/16 at 10:30am. Prolia ordered.

## 2016-05-29 ENCOUNTER — Ambulatory Visit (INDEPENDENT_AMBULATORY_CARE_PROVIDER_SITE_OTHER): Payer: Medicare Other | Admitting: Cardiovascular Disease

## 2016-05-29 ENCOUNTER — Encounter: Payer: Self-pay | Admitting: Cardiovascular Disease

## 2016-05-29 VITALS — BP 100/76 | HR 70 | Ht 65.0 in | Wt 137.6 lb

## 2016-05-29 DIAGNOSIS — Z8679 Personal history of other diseases of the circulatory system: Secondary | ICD-10-CM | POA: Diagnosis not present

## 2016-05-29 DIAGNOSIS — I5032 Chronic diastolic (congestive) heart failure: Secondary | ICD-10-CM | POA: Diagnosis not present

## 2016-05-29 DIAGNOSIS — Z950 Presence of cardiac pacemaker: Secondary | ICD-10-CM | POA: Diagnosis not present

## 2016-05-29 DIAGNOSIS — I495 Sick sinus syndrome: Secondary | ICD-10-CM

## 2016-05-29 MED ORDER — FUROSEMIDE 20 MG PO TABS: 40.0000 mg | ORAL_TABLET | Freq: Every day | ORAL | 3 refills | Status: DC

## 2016-05-29 NOTE — Patient Instructions (Signed)
Dr Sallyanne Kuster has recommended making the following medication changes: 1. DECREASE Furosemide to 40 mg in the morning 2. TAKE Furosemide 20 mg in the afternoon as needed - if you gain 3 pounds in 24 hours or 5 pounds in a week  Your physician recommends that you schedule a follow-up appointment in 3 months with Dr Sallyanne Kuster.  If you need a refill on your cardiac medications before your next appointment, please call your pharmacy.

## 2016-05-29 NOTE — Progress Notes (Signed)
Cardiology Office Note    Date:  05/29/2016   ID:  [name], DOB 02/02/1935, MRN [id number]  PCP:  [name], NP  Cardiologist:   Sanda Klein, MD   Chief Complaint  Patient presents with  . Follow-up    History of Present Illness:  [name] is a 81 y.o. female with sinus node dysfunction and implantation of a dual-chamber permanent pacemaker (Medtronic Revo 2013, programmed AAIR to avoid ventricular paced beats), non-Hodgkin's lymphoma in remission.  She presented with complaints of exertional dyspnea, edema and palpitations. These seem to occur after she took several plane trips including a visit to [facility]. Echo showed normal left ventricular systolic function but evidence of diastolic dysfunction and increased filling pressure. Pacemaker interrogation did not show atrial fibrillation or other significant rhythm abnormalities. He feels substantially better after starting treatment with diuretics. Her weight has decreased by about 6 pounds. She no longer has edema and denies dyspnea. The palpitations took a little longer to diminish, better after we started diltiazem. She is planning an upcoming trip to the [place] for Christmas.   Past Medical History:  Diagnosis Date  . Acute bronchitis 10/09/2012  . Anemia    pt denies  . Arthritis   . Atrial fibrillation ([place])   . Cataract    bil  . Cystocele    history with rectocele, pt denies  . Dysrhythmia   . GERD (gastroesophageal reflux disease)   . H/O hiatal hernia   . Hemorrhoid   . History of pancreatitis    pt denies  . Hypothyroidism   . Non Hodgkin's lymphoma ([place])   . Osteoporosis   . Pacemaker 09/15/2011   Medtronic Revo  . Pericarditis   . Pleural effusion, bilateral    history of  . Pulmonary nodules    history of  . Scoliosis   . Shortness of breath    with exertion  . Varicose vein     Past Surgical History:  Procedure Laterality Date  . ABDOMINAL HYSTERECTOMY  04/29/04     partial  . BREAST SURGERY  07/30/01   biopsy x 3, bilateral  . COLONOSCOPY    . EYE SURGERY     cataracts  . MASS EXCISION Right 10/28/2012   Procedure: Removal of mass on neck       ;  Surgeon: Adin Hector, MD;  Location: [facility];  Service: General;  Laterality: Right;  . PACEMAKER INSERTION  09/15/11   MDT  . PERMANENT PACEMAKER INSERTION N/A 09/15/2011   Procedure: PERMANENT PACEMAKER INSERTION;  Surgeon: Sanda Klein, MD;  Location: [facility] CATH [facility];  Service: Cardiovascular;  Laterality: N/A;  . TONSILLECTOMY      Current Medications: Outpatient Medications Prior to Visit  Medication Sig Dispense Refill  . acetaminophen (TYLENOL) 500 MG tablet Take 500 mg by mouth every 6 (six) hours as needed for pain.    [name] ALPRAZolam (XANAX) 0.5 MG tablet Take 1 tablet (0.5 mg total) by mouth at bedtime as needed for sleep. 30 tablet 0  . aspirin 81 MG tablet Take 81 mg by mouth daily.    . Calcium Carbonate-Vitamin D (CALTRATE 600+D) 600-400 MG-UNIT per tablet Take 1 tablet by mouth 2 (two) times daily.    [name] denosumab (PROLIA) 60 MG/ML SOLN injection Inject 60 mg into the skin every 6 (six) months. Reported on 10/16/2015    . diltiazem (CARDIZEM CD) 180 MG 24 hr capsule Take 1 capsule (180 mg total) by mouth  daily. 30 capsule 11  . gabapentin (NEURONTIN) 100 MG capsule take 1 capsule by mouth three times a day 90 capsule 0  . KRILL OIL OMEGA-3 PO Take 1 capsule by mouth daily.    [name] levothyroxine (SYNTHROID, LEVOTHROID) 100 MCG tablet Take 1 tablet (100 mcg total) by mouth daily. 90 tablet 1  . pantoprazole (PROTONIX) 40 MG tablet Take 1 tablet (40 mg total) by mouth daily. 30 tablet 5  . potassium chloride (K-DUR) 10 MEQ tablet Take 1 tablet (10 mEq total) by mouth daily. 90 tablet 3  . furosemide (LASIX) 20 MG tablet Take 2 tablets (40 mg total) by mouth every morning and take 1 tablet (20 mg total) by mouth every afternoon. 270 tablet 3  . azithromycin (ZITHROMAX) 250 MG tablet Take 2 pills the  first day and 1 pill daily until you run out. (Patient not taking: Reported on 05/29/2016) 6 tablet 0  . baclofen (LIORESAL) 10 MG tablet      No facility-administered medications prior to visit.      Allergies:   Amoxicillin; Buprenorphine; Codeine; Demerol; Morphine and related; Sulfonamide derivatives; and Vicodin [hydrocodone-acetaminophen]   Social History   Social History  . Marital status: Widowed    Spouse name: N/A  . Number of children: 3  . Years of education: N/A   Occupational History  . self employed    Social History Main Topics  . Smoking status: Never Smoker  . Smokeless tobacco: Never Used  . Alcohol use Yes     Comment: 1 glass of wine occasionally  . Drug use: No  . Sexual activity: Not Asked   Other Topics Concern  . 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  She lives alone in a 3-story home.  There is a lift chair which she does not use (placed for her husband).     Family History:  The patient's family history includes Breast cancer in her sister; Heart disease in her brother and daughter; Lymphoma in her daughter; Stroke in her father and mother.   ROS:   Please see the history of present illness.    ROS All other systems reviewed and are negative.   PHYSICAL EXAM:   VS:  BP 100/76 (BP Location: Left Arm, Patient Position: Sitting, Cuff Size: Normal)   Pulse 70   Ht 5\' 5"  (1.651 m)   Wt 137 lb 9.6 oz (62.4 kg)   SpO2 96%   BMI 22.90 kg/m    GEN: Well nourished, well developed, in no acute distress  HEENT: normal  Neck: no JVD, carotid bruits, or masses Cardiac: RRR; no murmurs, rubs, or gallops,no edema , healthy left subclavian pacemaker site Respiratory:  clear to auscultation bilaterally, normal work of breathing GI: soft, nontender, nondistended, + BS MS: no deformity or atrophy  Skin: warm and dry, no rash Neuro:  Alert and Oriented x 3, Strength  and sensation are intact Psych: euthymic mood, full affect  Wt Readings from Last 3 Encounters:  05/29/16 137 lb 9.6 oz (62.4 kg)  04/20/16 140 lb 3.2 oz (63.6 kg)  04/16/16 143 lb (64.9 kg)      Studies/Labs Reviewed:   EKG:  EKG is not ordered today.  The ekg ordered today demonstrates normal sinus rhythm, slightly delayed precordial R/S transition in V4-V5, no acute repolarization changes, not much change from previous tracings.  Recent Labs: 03/31/2016: Magnesium 1.8; TSH 1.62 04/10/2016: Hemoglobin  14.5; Platelets 363 04/16/2016: Pro B Natriuretic peptide (BNP) 119.0 04/20/2016: ALT 11; BUN 19; Creatinine, Ser 0.85; Potassium 3.8; Sodium 142   Lipid Panel    Component Value Date/Time   CHOL 238 (H) 10/06/2012 1202   TRIG 199 (H) 10/06/2012 1202   HDL 53 10/06/2012 1202   CHOLHDL 4.5 10/06/2012 1202   VLDL 40 10/06/2012 1202   LDLCALC 145 (H) 10/06/2012 1202    Additional studies/ records that were reviewed today include:  Records from Dr. Jenetta DownerConley Canal and Dr. Inda Merlin, ECG and labs    ASSESSMENT:    1. Sinus node dysfunction ([facility])   2. Chronic diastolic heart failure ([place])   3. Pacemaker   4. History of pericarditis      PLAN:  In order of problems listed above:  1. CHF: As far as I can tell this is her first episode with clinical congestive heart failure, although diastolic dysfunction has been described on her echocardiogram at least as far back as 2011. The acute exacerbation may have been related to inadvertent increased sodium intake during her travel. Reviewed the importance of sodium restriction and daily weight monitoring, pain particular attention when she is traveling, as sodium intake is likely to increase. I think she can decrease the diuretic back to 40 mg once daily only, but she can take the additional 20 mg in the afternoon if there is evidence of increasing weight (more than 3 pounds overnight or more than 5 pounds in a week). 2. SSS: Heart rate  distribution suggests satisfactory treatment for sinus node dysfunction. As we did not detect any true arrhythmia, suspect her palpitations were related to frequent PACs. 3. PM: Normal pacemaker function. Continue remote downloads every 3 months 4. History of post procedural acute pericarditis after pacemaker implantation, resolved. Had atrial fibrillation related to acute pericarditis, which has not recurred in years. No evidence of constrictive physiology on current echo. She has not received chest radiation therapy for lymphoma, just chemotherapy.  Medication Adjustments/Labs and Tests Ordered: Current medicines are reviewed at length with the patient today.  Concerns regarding medicines are outlined above.  Medication changes, Labs and Tests ordered today are listed in the Patient Instructions below. Patient Instructions  Dr Sallyanne Kuster has recommended making the following medication changes: 1. DECREASE Furosemide to 40 mg in the morning 2. TAKE Furosemide 20 mg in the afternoon as needed - if you gain 3 pounds in 24 hours or 5 pounds in a week  Your physician recommends that you schedule a follow-up appointment in 3 months with Dr Sallyanne Kuster.  If you need a refill on your cardiac medications before your next appointment, please call your pharmacy.    Signed, Sanda Klein, MD  05/29/2016 2:36 PM    [facility] [place], [place], [place]  [address] Phone: [phone]; Fax: [phone]

## 2016-06-02 ENCOUNTER — Ambulatory Visit (INDEPENDENT_AMBULATORY_CARE_PROVIDER_SITE_OTHER): Payer: Medicare Other

## 2016-06-02 DIAGNOSIS — M810 Age-related osteoporosis without current pathological fracture: Secondary | ICD-10-CM | POA: Diagnosis not present

## 2016-06-02 MED ORDER — DENOSUMAB 60 MG/ML ~~LOC~~ SOLN: 60.0000 mg | Freq: Once | SUBCUTANEOUS | Status: DC

## 2016-06-02 MED ORDER — DENOSUMAB 60 MG/ML ~~LOC~~ SOLN
60.0000 mg | Freq: Once | SUBCUTANEOUS | Status: AC
  Administered 2016-06-02: 60 mg via SUBCUTANEOUS

## 2016-06-02 NOTE — Progress Notes (Deleted)
Pre visit review using our clinic review tool, if applicable. No additional management support is needed unless otherwise documented below in the visit note. 

## 2016-06-02 NOTE — Progress Notes (Signed)
Pre visit review using our clinic tool,if applicable. No additional management support is needed unless otherwise documented below in the visit note.   Patient in for Prolia injection. Given SQ left arm. Patient tolerated well.

## 2016-06-03 DIAGNOSIS — J019 Acute sinusitis, unspecified: Secondary | ICD-10-CM | POA: Diagnosis not present

## 2016-06-04 ENCOUNTER — Other Ambulatory Visit (HOSPITAL_BASED_OUTPATIENT_CLINIC_OR_DEPARTMENT_OTHER): Payer: Medicare Other

## 2016-06-04 DIAGNOSIS — C8590 Non-Hodgkin lymphoma, unspecified, unspecified site: Secondary | ICD-10-CM

## 2016-06-04 DIAGNOSIS — C8221 Follicular lymphoma grade III, unspecified, lymph nodes of head, face, and neck: Secondary | ICD-10-CM

## 2016-06-04 LAB — CBC WITH DIFFERENTIAL/PLATELET
BASO%: 0.2 % (ref 0.0–2.0)
Basophils Absolute: 0 10e3/uL (ref 0.0–0.1)
EOS%: 0.6 % (ref 0.0–7.0)
Eosinophils Absolute: 0.1 10e3/uL (ref 0.0–0.5)
HCT: 42.4 % (ref 34.8–46.6)
HGB: 14 g/dL (ref 11.6–15.9)
LYMPH%: 21.4 % (ref 14.0–49.7)
MCH: 31.4 pg (ref 25.1–34.0)
MCHC: 33 g/dL (ref 31.5–36.0)
MCV: 95.1 fL (ref 79.5–101.0)
MONO#: 0.6 10e3/uL (ref 0.1–0.9)
MONO%: 6.7 % (ref 0.0–14.0)
NEUT#: 6 10e3/uL (ref 1.5–6.5)
NEUT%: 71.1 % (ref 38.4–76.8)
Platelets: 208 10e3/uL (ref 145–400)
RBC: 4.46 10e6/uL (ref 3.70–5.45)
RDW: 14 % (ref 11.2–14.5)
WBC: 8.4 10e3/uL (ref 3.9–10.3)
lymph#: 1.8 10e3/uL (ref 0.9–3.3)

## 2016-06-04 LAB — COMPREHENSIVE METABOLIC PANEL
ALBUMIN: 3.5 g/dL (ref 3.5–5.0)
ALK PHOS: 82 U/L (ref 40–150)
ALT: 21 U/L (ref 0–55)
AST: 22 U/L (ref 5–34)
Anion Gap: 10 mEq/L (ref 3–11)
BILIRUBIN TOTAL: 0.39 mg/dL (ref 0.20–1.20)
BUN: 13.2 mg/dL (ref 7.0–26.0)
CALCIUM: 9.5 mg/dL (ref 8.4–10.4)
CO2: 27 mEq/L (ref 22–29)
Chloride: 106 mEq/L (ref 98–109)
Creatinine: 0.8 mg/dL (ref 0.6–1.1)
EGFR: 69 mL/min/{1.73_m2} — AB (ref >90)
GLUCOSE: 91 mg/dL (ref 70–140)
Potassium: 3.6 mEq/L (ref 3.5–5.1)
SODIUM: 144 meq/L (ref 136–145)
TOTAL PROTEIN: 7 g/dL (ref 6.4–8.3)

## 2016-06-04 LAB — LACTATE DEHYDROGENASE: LDH: 395 U/L — ABNORMAL HIGH (ref 125–245)

## 2016-06-05 LAB — IGG, IGA, IGM
IGA/IMMUNOGLOBULIN A, SERUM: 69 mg/dL (ref 64–422)
IgG, Qn, Serum: 915 mg/dL (ref 700–1600)
IgM, Qn, Serum: 16 mg/dL — ABNORMAL LOW (ref 26–217)

## 2016-06-11 ENCOUNTER — Encounter: Payer: Self-pay | Admitting: Internal Medicine

## 2016-06-11 ENCOUNTER — Ambulatory Visit (HOSPITAL_BASED_OUTPATIENT_CLINIC_OR_DEPARTMENT_OTHER): Payer: Medicare Other | Admitting: Internal Medicine

## 2016-06-11 VITALS — BP 117/94 | HR 90 | Temp 98.0°F | Resp 17 | Ht 65.0 in | Wt 138.8 lb

## 2016-06-11 DIAGNOSIS — Z8572 Personal history of non-Hodgkin lymphomas: Secondary | ICD-10-CM | POA: Diagnosis not present

## 2016-06-11 DIAGNOSIS — C8221 Follicular lymphoma grade III, unspecified, lymph nodes of head, face, and neck: Secondary | ICD-10-CM

## 2016-06-11 NOTE — Progress Notes (Signed)
[facility] Telephone:([phone]) [phone]   Fax:(336) [phone]  OFFICE PROGRESS NOTE  [name], [name] [address]  DIAGNOSIS: Recurrent non-Hodgkin lymphoma, follicular center cell type with predominant follicular pattern favor high-grade (grade 3/3) diagnosed in November 2012.  PRIOR THERAPY: 1) Status post treatment with Rituxan weekly for 3 doses in addition to 3 tablets of Afinitor at [facility] in [place].  2) Status post surgical excision of the right neck mass under the care of Dr. Johney Maine on 10/28/2012.  3)  Maintenance Rituxan 375 mg/M2 every 2 months status post 12 cycles, last dose was given 10/01/2014.  CURRENT THERAPY: Observation.  INTERVAL HISTORY: [name] 81 y.o. female returns to the clinic today for six-month follow-up visit. The patient is feeling fine today with no specific complaints. She denied having any unintentional weight loss. She denied having any night sweats. She has no chest pain, shortness of breath, cough or hemoptysis. She denied having any fever or chills but she has chest congestion recently. The patient denied having any nausea or vomiting, no fever or chills. She is receiving Hinzentra injection weekly from a physician in [place], Dr. Posey Pronto to boost her immune system. She is here today for reevaluation with repeat blood work.  MEDICAL HISTORY: Past Medical History:  Diagnosis Date  . Acute bronchitis 10/09/2012  . Anemia    pt denies  . Arthritis   . Atrial fibrillation ([place])   . Cataract    bil  . Cystocele    history with rectocele, pt denies  . Dysrhythmia   . GERD (gastroesophageal reflux disease)   . H/O hiatal hernia   . Hemorrhoid   . History of pancreatitis    pt denies  . Hypothyroidism   . Non Hodgkin's lymphoma ([place])   . Osteoporosis   . Pacemaker 09/15/2011   Medtronic Revo  . Pericarditis   . Pleural effusion, bilateral    history of  .  Pulmonary nodules    history of  . Scoliosis   . Shortness of breath    with exertion  . Varicose vein     ALLERGIES:  is allergic to amoxicillin; buprenorphine; codeine; demerol; morphine and related; sulfonamide derivatives; and vicodin [hydrocodone-acetaminophen].  MEDICATIONS:  Current Outpatient Prescriptions  Medication Sig Dispense Refill  . acetaminophen (TYLENOL) 500 MG tablet Take 500 mg by mouth every 6 (six) hours as needed for pain.    [name] ALPRAZolam (XANAX) 0.5 MG tablet Take 1 tablet (0.5 mg total) by mouth at bedtime as needed for sleep. 30 tablet 0  . aspirin 81 MG tablet Take 81 mg by mouth daily.    . Calcium Carbonate-Vitamin D (CALTRATE 600+D) 600-400 MG-UNIT per tablet Take 1 tablet by mouth 2 (two) times daily.    [name] denosumab (PROLIA) 60 MG/ML SOLN injection Inject 60 mg into the skin every 6 (six) months. Reported on 10/16/2015    . diltiazem (CARDIZEM CD) 180 MG 24 hr capsule Take 1 capsule (180 mg total) by mouth daily. 30 capsule 11  . furosemide (LASIX) 20 MG tablet Take 2 tablets (40 mg total) by mouth daily. Take 1-2 tablets (20-40 mg total) by mouth daily as directed. 270 tablet 3  . gabapentin (NEURONTIN) 100 MG capsule take 1 capsule by mouth three times a day 90 capsule 0  . KRILL OIL OMEGA-3 PO Take 1 capsule by mouth daily.    [name] levothyroxine (SYNTHROID, LEVOTHROID) 100  MCG tablet Take 1 tablet (100 mcg total) by mouth daily. 90 tablet 1  . pantoprazole (PROTONIX) 40 MG tablet Take 1 tablet (40 mg total) by mouth daily. 30 tablet 5  . potassium chloride (K-DUR) 10 MEQ tablet Take 1 tablet (10 mEq total) by mouth daily. 90 tablet 3   No current facility-administered medications for this visit.     SURGICAL HISTORY:  Past Surgical History:  Procedure Laterality Date  . ABDOMINAL HYSTERECTOMY  04/29/04   partial  . BREAST SURGERY  07/30/01   biopsy x 3, bilateral  . COLONOSCOPY    . EYE SURGERY     cataracts  . MASS EXCISION Right 10/28/2012    Procedure: Removal of mass on neck       ;  Surgeon: Adin Hector, MD;  Location: [facility];  Service: General;  Laterality: Right;  . PACEMAKER INSERTION  09/15/11   MDT  . PERMANENT PACEMAKER INSERTION N/A 09/15/2011   Procedure: PERMANENT PACEMAKER INSERTION;  Surgeon: Sanda Klein, MD;  Location: [facility] CATH LAB;  Service: Cardiovascular;  Laterality: N/A;  . TONSILLECTOMY      REVIEW OF SYSTEMS:  A comprehensive review of systems was negative except for: Ears, nose, mouth, throat, and face: positive for nasal congestion   PHYSICAL EXAMINATION: General appearance: alert, cooperative and no distress Head: Normocephalic, without obvious abnormality, atraumatic Neck: no adenopathy, no JVD, supple, symmetrical, trachea midline and thyroid not enlarged, symmetric, no tenderness/mass/nodules Lymph nodes: Cervical, supraclavicular, and axillary nodes normal. Resp: clear to auscultation bilaterally Back: symmetric, no curvature. ROM normal. No CVA tenderness. Cardio: regular rate and rhythm, S1, S2 normal, no murmur, click, rub or gallop GI: soft, non-tender; bowel sounds normal; no masses,  no organomegaly Extremities: extremities normal, atraumatic, no cyanosis or edema  ECOG PERFORMANCE STATUS: 1 - Symptomatic but completely ambulatory  Blood pressure (!) 117/94, pulse 90, temperature 98 F (36.7 C), temperature source Oral, resp. rate 17, height 5\' 5"  (1.651 m), weight 138 lb 12.8 oz (63 kg), SpO2 97 %.  LABORATORY DATA: Lab Results  Component Value Date   WBC 8.4 06/04/2016   HGB 14.0 06/04/2016   HCT 42.4 06/04/2016   MCV 95.1 06/04/2016   PLT 208 06/04/2016      Chemistry      Component Value Date/Time   NA 144 06/04/2016 1014   K 3.6 06/04/2016 1014   CL 102 04/20/2016 1210   CL 109 (H) 12/14/2012 1009   CO2 27 06/04/2016 1014   BUN 13.2 06/04/2016 1014   CREATININE 0.8 06/04/2016 1014      Component Value Date/Time   CALCIUM 9.5 06/04/2016 1014   ALKPHOS 82 06/04/2016  1014   AST 22 06/04/2016 1014   ALT 21 06/04/2016 1014   BILITOT 0.39 06/04/2016 1014       RADIOGRAPHIC STUDIES: No results found.  ASSESSMENT AND PLAN: This is a very pleasant 81 years old white female with recurrent non-Hodgkin lymphoma, follicular center cell type status post treatment with Rituxan as well as Afinitor at [facility] and she also completed a course of maintenance Rituxan for 12 cycles. Last dose was given 10/01/2014. She is currently on observation and her lab work is unremarkable except for elevated LDH. I'm not sure if this is secondary to disease progression or because of the IgG injection she is receiving in [place]. I recommended for the patient to have repeat CT scan of the chest, abdomen and pelvis next week to rule out any disease  recurrence. If no evidence for disease recurrence on the scan, I will see her back for follow-up visit in 6 months with repeat blood work. She was advised to call immediately if she has any concerning symptoms in the interval. The patient voices understanding of current disease status and treatment options and is in agreement with the current care plan.  All questions were answered. The patient knows to call the clinic with any problems, questions or concerns. We can certainly see the patient much sooner if necessary.   Disclaimer: This note was dictated with voice recognition software. Similar sounding words can inadvertently be transcribed and may not be corrected upon review.

## 2016-06-15 ENCOUNTER — Other Ambulatory Visit: Payer: Self-pay | Admitting: Physician Assistant

## 2016-06-16 NOTE — Telephone Encounter (Signed)
Rx faxed to pharmacy  

## 2016-06-16 NOTE — Telephone Encounter (Signed)
Last RF: 05/08/16, #30 Last OV: 03/31/16 Next OV: due 07/01/16 UDS: 03/31/16  Rx printed and forwarded to PCP for signature.

## 2016-06-18 ENCOUNTER — Ambulatory Visit (HOSPITAL_COMMUNITY)
Admission: RE | Admit: 2016-06-18 | Discharge: 2016-06-18 | Disposition: A | Discharge status: Home / Self Care | Payer: Medicare Other | Source: Ambulatory Visit | Attending: Internal Medicine | Admitting: Internal Medicine

## 2016-06-18 ENCOUNTER — Other Ambulatory Visit: Payer: Self-pay | Admitting: Family

## 2016-06-18 ENCOUNTER — Encounter (HOSPITAL_COMMUNITY): Payer: Self-pay

## 2016-06-18 ENCOUNTER — Ambulatory Visit (HOSPITAL_COMMUNITY): Payer: Medicare Other

## 2016-06-18 DIAGNOSIS — R918 Other nonspecific abnormal finding of lung field: Secondary | ICD-10-CM | POA: Insufficient documentation

## 2016-06-18 DIAGNOSIS — C8580 Other specified types of non-Hodgkin lymphoma, unspecified site: Secondary | ICD-10-CM | POA: Diagnosis not present

## 2016-06-18 DIAGNOSIS — C8221 Follicular lymphoma grade III, unspecified, lymph nodes of head, face, and neck: Secondary | ICD-10-CM | POA: Insufficient documentation

## 2016-06-18 MED ORDER — IOPAMIDOL (ISOVUE-300) INJECTION 61%
100.0000 mL | Freq: Once | INTRAVENOUS | Status: AC | PRN
  Administered 2016-06-18: 100 mL via INTRAVENOUS

## 2016-06-18 MED ORDER — IOPAMIDOL (ISOVUE-300) INJECTION 61%
INTRAVENOUS | Status: AC
  Filled 2016-06-18: qty 100

## 2016-06-25 ENCOUNTER — Telehealth: Payer: Self-pay | Admitting: General Practice

## 2016-06-25 NOTE — Telephone Encounter (Signed)
Left msg regarding June 2018 appts.

## 2016-06-30 ENCOUNTER — Telehealth: Payer: Self-pay | Admitting: *Deleted

## 2016-06-30 NOTE — Telephone Encounter (Signed)
Pt is calling for results on CT scan done on 06/18/16.  States has not heard anything yet.

## 2016-06-30 NOTE — Telephone Encounter (Signed)
Scan is good. F/U visit as scheduled in 6 months.

## 2016-07-01 NOTE — Telephone Encounter (Signed)
PT notified

## 2016-07-04 ENCOUNTER — Other Ambulatory Visit: Payer: Self-pay | Admitting: Family

## 2016-07-06 ENCOUNTER — Telehealth: Payer: Self-pay | Admitting: Family

## 2016-07-06 NOTE — Telephone Encounter (Signed)
Patient would like a call in the morning to schedule medicare wellness appointment.

## 2016-07-07 DIAGNOSIS — Z79899 Other long term (current) drug therapy: Secondary | ICD-10-CM | POA: Diagnosis not present

## 2016-07-07 DIAGNOSIS — H5212 Myopia, left eye: Secondary | ICD-10-CM | POA: Diagnosis not present

## 2016-07-07 DIAGNOSIS — H518 Other specified disorders of binocular movement: Secondary | ICD-10-CM | POA: Diagnosis not present

## 2016-07-07 DIAGNOSIS — M47816 Spondylosis without myelopathy or radiculopathy, lumbar region: Secondary | ICD-10-CM | POA: Diagnosis not present

## 2016-07-07 DIAGNOSIS — M4126 Other idiopathic scoliosis, lumbar region: Secondary | ICD-10-CM | POA: Diagnosis not present

## 2016-07-07 DIAGNOSIS — G894 Chronic pain syndrome: Secondary | ICD-10-CM | POA: Diagnosis not present

## 2016-07-07 DIAGNOSIS — M791 Myalgia: Secondary | ICD-10-CM | POA: Diagnosis not present

## 2016-07-07 DIAGNOSIS — Z79891 Long term (current) use of opiate analgesic: Secondary | ICD-10-CM | POA: Diagnosis not present

## 2016-07-07 DIAGNOSIS — H52203 Unspecified astigmatism, bilateral: Secondary | ICD-10-CM | POA: Diagnosis not present

## 2016-07-07 DIAGNOSIS — H5201 Hypermetropia, right eye: Secondary | ICD-10-CM | POA: Diagnosis not present

## 2016-07-08 ENCOUNTER — Telehealth: Payer: Self-pay | Admitting: Cardiovascular Disease

## 2016-07-08 NOTE — Telephone Encounter (Signed)
There is no compelling reason for her to continue taking aspirin, okay to stop it. Thank you MCr

## 2016-07-08 NOTE — Telephone Encounter (Signed)
Recommendations relayed to patient, who voiced understanding and thanks.

## 2016-07-08 NOTE — Telephone Encounter (Signed)
Pt taking 81mg  ASA and 500mg  fish oil, and had noticed if she has to scratch her skin when itching it leaves a red place even if she barely scratches-wants to know if taking too much blood thinner? pls call [phone]

## 2016-07-08 NOTE — Telephone Encounter (Signed)
Spoke to patient regarding her concerns. Notes she's been bruising easily for the last month or so. States she's always finding new small purplish spots under her skin.   We discussed her meds. Explained that fish oil is not a "blood thinner" so this wouldn't likely be a reason for her to have this finding. Explained that the ASA is a low dose, probably doesn't explain this either.  She's not on Plavix. I asked her if she's been more fatigued, had any other symptoms - she denies anything acute, states energy level normal.  She does note that she takes an iron supplement, as well.  Pt aware I will see if Dr. Sallyanne Kuster has any advice. Routed for review.

## 2016-07-16 ENCOUNTER — Encounter: Payer: Medicare Other | Admitting: *Deleted

## 2016-07-17 ENCOUNTER — Encounter: Payer: Self-pay | Admitting: Cardiology

## 2016-07-24 ENCOUNTER — Other Ambulatory Visit: Payer: Self-pay | Admitting: Family

## 2016-07-24 NOTE — Telephone Encounter (Signed)
Last Rf: 06/14/16, #30 Last OV: 10;3/17 with PCP Next OV: None scheduled. UDS: 03/31/16 and due now.  Rx called to pharmacy voicemail. Please advise when pt should f/u in the office.

## 2016-07-26 NOTE — Telephone Encounter (Signed)
She is due for follow up please.  

## 2016-07-28 ENCOUNTER — Ambulatory Visit (INDEPENDENT_AMBULATORY_CARE_PROVIDER_SITE_OTHER): Payer: Medicare Other | Admitting: *Deleted

## 2016-07-28 DIAGNOSIS — I495 Sick sinus syndrome: Secondary | ICD-10-CM | POA: Diagnosis not present

## 2016-07-28 NOTE — Progress Notes (Signed)
Remote pacemaker transmission.   

## 2016-07-28 NOTE — Telephone Encounter (Signed)
Shiquita-- please call pt to arrange follow up with Melissa soon. Thanks!

## 2016-07-29 ENCOUNTER — Encounter: Payer: Self-pay | Admitting: Cardiology

## 2016-07-29 NOTE — Telephone Encounter (Signed)
Pt has been scheduled.  °

## 2016-07-30 NOTE — Telephone Encounter (Signed)
Patient declined medicare wellness appointment.

## 2016-07-31 ENCOUNTER — Telehealth: Payer: Self-pay | Admitting: *Deleted

## 2016-07-31 NOTE — Telephone Encounter (Signed)
Declines AWV

## 2016-08-02 LAB — CUP PACEART REMOTE DEVICE CHECK
Battery Voltage: 2.99 V
Date Time Interrogation Session: 20180130144846
Implantable Lead Implant Date: 20130319
Implantable Lead Location: 753859
Implantable Lead Location: 753860
Implantable Pulse Generator Implant Date: 20130319
Lead Channel Sensing Intrinsic Amplitude: 1.064 mV
Lead Channel Sensing Intrinsic Amplitude: 15.692 mV
Lead Channel Setting Pacing Amplitude: 2 V
MDC IDC LEAD IMPLANT DT: 20130319
MDC IDC MSMT LEADCHNL RA IMPEDANCE VALUE: 400 Ohm
MDC IDC MSMT LEADCHNL RV IMPEDANCE VALUE: 496 Ohm
MDC IDC SET LEADCHNL RV SENSING SENSITIVITY: 0.9 mV
MDC IDC STAT BRADY RA PERCENT PACED: 36.47 %

## 2016-08-05 ENCOUNTER — Encounter: Payer: Self-pay | Admitting: Family

## 2016-08-05 ENCOUNTER — Ambulatory Visit (INDEPENDENT_AMBULATORY_CARE_PROVIDER_SITE_OTHER): Payer: Medicare Other | Admitting: Family

## 2016-08-05 VITALS — BP 100/78 | HR 72 | Temp 97.8°F | Resp 16 | Ht 65.0 in | Wt 138.4 lb

## 2016-08-05 DIAGNOSIS — G6289 Other specified polyneuropathies: Secondary | ICD-10-CM

## 2016-08-05 DIAGNOSIS — E039 Hypothyroidism, unspecified: Secondary | ICD-10-CM

## 2016-08-05 DIAGNOSIS — I10 Essential (primary) hypertension: Secondary | ICD-10-CM

## 2016-08-05 DIAGNOSIS — F419 Anxiety disorder, unspecified: Secondary | ICD-10-CM | POA: Diagnosis not present

## 2016-08-05 LAB — BASIC METABOLIC PANEL
BUN: 24 mg/dL — ABNORMAL HIGH (ref 6–23)
CALCIUM: 9.7 mg/dL (ref 8.4–10.5)
CO2: 30 meq/L (ref 19–32)
Chloride: 102 mEq/L (ref 96–112)
Creatinine, Ser: 0.67 mg/dL (ref 0.40–1.20)
GFR: 90 mL/min (ref >60.00)
Glucose, Bld: 77 mg/dL (ref 70–99)
Potassium: 3.7 mEq/L (ref 3.5–5.1)
SODIUM: 140 meq/L (ref 135–145)

## 2016-08-05 MED ORDER — GABAPENTIN 300 MG PO CAPS: 300.0000 mg | ORAL_CAPSULE | Freq: Three times a day (TID) | ORAL | 3 refills | Status: DC

## 2016-08-05 NOTE — Progress Notes (Signed)
Pre visit review using our clinic review tool, if applicable. No additional management support is needed unless otherwise documented below in the visit note. 

## 2016-08-05 NOTE — Patient Instructions (Signed)
Please complete lab work prior to leaving.   

## 2016-08-05 NOTE — Progress Notes (Signed)
Subjective:    Patient ID: [name], female    DOB: 31-Dec-1934, 82 y.o.   MRN: [id number]  HPI  [name] is an 82 y rold female here today for follow up.  1) HTN- on lasix and diltiazem   BP Readings from Last 3 Encounters:  08/05/16 100/78  06/11/16 (!) 117/94  05/29/16 100/76   2) Hypothyroid- On synthroid 169mcg once daily.   Lab Results  Component Value Date   TSH 1.62 03/31/2016   3) Anxiety- reports anxiety is ok, uses xanax hs prn.    4) foot pain-  Reports that she has she has burning  In the soles of her feet.  Notes tha the gabapentin helps some but hoping for a stronger dose.   Review of Systems See HPI  Past Medical History:  Diagnosis Date  . Acute bronchitis 10/09/2012  . Anemia    pt denies  . Arthritis   . Atrial fibrillation ([place])   . Cataract    bil  . Cystocele    history with rectocele, pt denies  . Dysrhythmia   . GERD (gastroesophageal reflux disease)   . H/O hiatal hernia   . Hemorrhoid   . History of pancreatitis    pt denies  . Hypothyroidism   . Non Hodgkin's lymphoma ([place])   . Osteoporosis   . Pacemaker 09/15/2011   Medtronic Revo  . Pericarditis   . Pleural effusion, bilateral    history of  . Pulmonary nodules    history of  . Scoliosis   . Shortness of breath    with exertion  . Varicose vein      Social History   Social History  . Marital status: Widowed    Spouse name: N/A  . Number of children: 3  . Years of education: N/A   Occupational History  . self employed    Social History Main Topics  . Smoking status: Never Smoker  . Smokeless tobacco: Never Used  . Alcohol use Yes     Comment: 1 glass of wine occasionally  . Drug use: No  . Sexual activity: Not on file   Other Topics Concern  . 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  She lives alone in a 3-story home.  There is a lift chair which she  does not use (placed for her husband).    Past Surgical History:  Procedure Laterality Date  . ABDOMINAL HYSTERECTOMY  04/29/04   partial  . BREAST SURGERY  07/30/01   biopsy x 3, bilateral  . COLONOSCOPY    . EYE SURGERY     cataracts  . MASS EXCISION Right 10/28/2012   Procedure: Removal of mass on neck       ;  Surgeon: Adin Hector, MD;  Location: [facility];  Service: General;  Laterality: Right;  . PACEMAKER INSERTION  09/15/11   MDT  . PERMANENT PACEMAKER INSERTION N/A 09/15/2011   Procedure: PERMANENT PACEMAKER INSERTION;  Surgeon: Sanda Klein, MD;  Location: [facility] CATH [facility];  Service: Cardiovascular;  Laterality: N/A;  . TONSILLECTOMY      Family History  Problem Relation Age of Onset  . Stroke Mother   . Stroke Father   . Heart disease Daughter     congenital "whole in her heart"  . Lymphoma Daughter     nonhodgkins  . Breast cancer Sister   . Heart  disease Brother     Allergies  Allergen Reactions  . Amoxicillin Hives    Hives  . Buprenorphine Hives  . Codeine Hives    REACTION: "makes her crazy" Hives  . Demerol Hives  . Morphine And Related Hives  . Sulfonamide Derivatives Rash    REACTION: Rash  . Vicodin [Hydrocodone-Acetaminophen] Hives    Current Outpatient Prescriptions on File Prior to Visit  Medication Sig Dispense Refill  . acetaminophen (TYLENOL) 500 MG tablet Take 500 mg by mouth every 6 (six) hours as needed for pain.    [name] ALPRAZolam (XANAX) 0.5 MG tablet take 1 tablet by mouth at bedtime if needed for sleep 30 tablet 0  . Calcium Carbonate-Vitamin D (CALTRATE 600+D) 600-400 MG-UNIT per tablet Take 1 tablet by mouth 2 (two) times daily.    [name] denosumab (PROLIA) 60 MG/ML SOLN injection Inject 60 mg into the skin every 6 (six) months. Reported on 10/16/2015    . diltiazem (CARDIZEM CD) 180 MG 24 hr capsule Take 1 capsule (180 mg total) by mouth daily. 30 capsule 11  . furosemide (LASIX) 20 MG tablet Take 2 tablets (40 mg total) by mouth daily. Take 1-2  tablets (20-40 mg total) by mouth daily as directed. 270 tablet 3  . KRILL OIL OMEGA-3 PO Take 1 capsule by mouth daily.    [name] levothyroxine (SYNTHROID, LEVOTHROID) 100 MCG tablet Take 1 tablet (100 mcg total) by mouth daily. 90 tablet 1  . omeprazole (PRILOSEC) 40 MG capsule take 1 capsule by mouth once daily 30 capsule 5  . pantoprazole (PROTONIX) 40 MG tablet Take 1 tablet (40 mg total) by mouth daily. 30 tablet 5  . potassium chloride (K-DUR) 10 MEQ tablet Take 1 tablet (10 mEq total) by mouth daily. 90 tablet 3   No current facility-administered medications on file prior to visit.     BP 100/78 (BP Location: Right Arm, Cuff Size: Normal)   Pulse 72   Temp 97.8 F (36.6 C) (Oral)   Resp 16   Ht 5\' 5"  (1.651 m)   Wt 138 lb 6.4 oz (62.8 kg)   SpO2 94% Comment: room air  BMI 23.03 kg/m       Objective:   Physical Exam  Constitutional: She is oriented to person, place, and time. She appears well-developed and well-nourished.  HENT:  Head: Normocephalic and atraumatic.  Cardiovascular: Normal rate, regular rhythm and normal heart sounds.   No murmur heard. Pulmonary/Chest: Effort normal and breath sounds normal. No respiratory distress. She has no wheezes.  Neurological: She is alert and oriented to person, place, and time.  Skin: Skin is warm and dry.  Psychiatric: She has a normal mood and affect. Her behavior is normal. Judgment and thought content normal.          Assessment & Plan:

## 2016-08-06 ENCOUNTER — Encounter: Payer: Self-pay | Admitting: Family

## 2016-08-06 DIAGNOSIS — Z79899 Other long term (current) drug therapy: Secondary | ICD-10-CM | POA: Diagnosis not present

## 2016-08-06 NOTE — Assessment & Plan Note (Signed)
Stable, continue prn xanax.   

## 2016-08-06 NOTE — Assessment & Plan Note (Signed)
Clinically stable on synthroid, continue same.  

## 2016-08-06 NOTE — Assessment & Plan Note (Signed)
Uncontrolled. Will increase gabapentin from 100mg tid to 300mg tid.  

## 2016-08-06 NOTE — Assessment & Plan Note (Signed)
Stable on current meds, continue same.  

## 2016-08-12 DIAGNOSIS — J069 Acute upper respiratory infection, unspecified: Secondary | ICD-10-CM | POA: Diagnosis not present

## 2016-08-12 DIAGNOSIS — R05 Cough: Secondary | ICD-10-CM | POA: Diagnosis not present

## 2016-08-19 ENCOUNTER — Telehealth: Payer: Self-pay | Admitting: Cardiovascular Disease

## 2016-08-19 NOTE — Telephone Encounter (Signed)
New message      Pt c/o medication issue:  1. Name of Medication: cartia 2. How are you currently taking this medication (dosage and times per day)? 180mg  3. Are you having a reaction (difficulty breathing--STAT)? no 4. What is your medication issue?  Pt is getting amlodipine 5mg  from Dr [name].  Pharmacy want to double check and see if she is still on the cartia since these medications are in the same family.  Please call

## 2016-08-19 NOTE — Telephone Encounter (Signed)
Returned call to pharmacy-pharmacy wondering if amlodipine is on current medication list.   Advised per medication list and last OV amlodipine is not listed.

## 2016-09-03 DIAGNOSIS — M4126 Other idiopathic scoliosis, lumbar region: Secondary | ICD-10-CM | POA: Diagnosis not present

## 2016-09-03 DIAGNOSIS — M545 Low back pain: Secondary | ICD-10-CM | POA: Diagnosis not present

## 2016-09-03 DIAGNOSIS — M791 Myalgia: Secondary | ICD-10-CM | POA: Diagnosis not present

## 2016-09-09 DIAGNOSIS — H50012 Monocular esotropia, left eye: Secondary | ICD-10-CM | POA: Diagnosis not present

## 2016-09-09 DIAGNOSIS — H5022 Vertical strabismus, left eye: Secondary | ICD-10-CM | POA: Insufficient documentation

## 2016-09-14 ENCOUNTER — Telehealth: Payer: Self-pay | Admitting: Family

## 2016-09-15 ENCOUNTER — Ambulatory Visit (INDEPENDENT_AMBULATORY_CARE_PROVIDER_SITE_OTHER): Payer: Medicare Other | Admitting: Cardiovascular Disease

## 2016-09-15 ENCOUNTER — Encounter: Payer: Self-pay | Admitting: Cardiovascular Disease

## 2016-09-15 VITALS — BP 120/60 | HR 74 | Ht 64.0 in | Wt 136.0 lb

## 2016-09-15 DIAGNOSIS — I3139 Other pericardial effusion (noninflammatory): Secondary | ICD-10-CM

## 2016-09-15 DIAGNOSIS — I495 Sick sinus syndrome: Secondary | ICD-10-CM

## 2016-09-15 DIAGNOSIS — I5032 Chronic diastolic (congestive) heart failure: Secondary | ICD-10-CM

## 2016-09-15 DIAGNOSIS — Z950 Presence of cardiac pacemaker: Secondary | ICD-10-CM

## 2016-09-15 DIAGNOSIS — R001 Bradycardia, unspecified: Secondary | ICD-10-CM | POA: Diagnosis not present

## 2016-09-15 DIAGNOSIS — I313 Pericardial effusion (noninflammatory): Secondary | ICD-10-CM | POA: Diagnosis not present

## 2016-09-15 MED ORDER — MAGNESIUM OXIDE -MG SUPPLEMENT 400 (240 MG) MG PO TABS: 400.0000 mg | ORAL_TABLET | Freq: Every day | ORAL | Status: DC

## 2016-09-15 MED ORDER — FUROSEMIDE 20 MG PO TABS: 20.0000 mg | ORAL_TABLET | Freq: Every day | ORAL | 3 refills | Status: DC

## 2016-09-15 NOTE — Patient Instructions (Signed)
Dr Sallyanne Kuster has recommended making the following medication changes: 1. DECREASE Furosemide to 20 mg - take 1 tablet by mouth daily 2. START over-the-counter Magnesium Oxide 400 mg - take 1 tablet daily  Remote monitoring is used to monitor your Pacemaker of ICD from home. This monitoring reduces the number of office visits required to check your device to one time per year. It allows [place] to keep an eye on the functioning of your device to ensure it is working properly. You are scheduled for a device check from home on Tuesday, June 19th, 2018. You may send your transmission at any time that day. If you have a wireless device, the transmission will be sent automatically. After your physician reviews your transmission, you will receive a postcard with your next transmission date.  Dr Sallyanne Kuster recommends that you schedule a follow-up appointment in 6 months with a pacemaker check. You will receive a reminder letter in the mail two months in advance. If you don't receive a letter, please call our office to schedule the follow-up appointment.  If you need a refill on your cardiac medications before your next appointment, please call your pharmacy.

## 2016-09-15 NOTE — Telephone Encounter (Signed)
Pt needs refill on Alprazolam   Last RX:07/24/2016  Last OV:08/05/2016  Next OV:FIEP   UDS:08/05/2016  CSC:07/18/2014

## 2016-09-15 NOTE — Telephone Encounter (Signed)
Rx placed at front desk for pick up. Notified pt and advised her that we need to update her controlled substance contract as well. Contract printed and placed at front desk along with Rx. Scheduled pt follow up for 12/02/16 at 10:30am and appt card placed with Rx per pt request and she will pick up today.

## 2016-09-15 NOTE — Telephone Encounter (Signed)
rx signed

## 2016-09-15 NOTE — Progress Notes (Signed)
Cardiology Office Note    Date:  09/19/2016   ID:  [name], DOB 04/13/1935, MRN [id number]  PCP:  [name]., NP  Cardiologist:   Sanda Klein, MD   No chief complaint on file.   History of Present Illness:  [name] is a 82 y.o. female with sinus node dysfunction and implantation of a dual-chamber permanent pacemaker (Medtronic Revo 2013, programmed AAIR to avoid ventricular paced beats), non-Hodgkin's lymphoma in remission.  She returns in follow-up after having an episode of heart failure exacerbation, likely related to increased sodium intake during trips. She is surprised that a small dose of diuretic make such a big difference in her symptoms.  She denies exertional dyspnea, edema and palpitations. Has muscle cramps at night. Most recent labs showed a potassium of 3.7. She is taking a daily potassium supplement. She is on chronic proton pump inhibitor therapy. May need to add magnesium.  Echo showed normal left ventricular systolic function but evidence of diastolic dysfunction and increased filling pressure. Pacemaker interrogation did not show atrial fibrillation or other significant rhythm abnormalities. Last check was a remote download on February 4.  Past Medical History:  Diagnosis Date  . Acute bronchitis 10/09/2012  . Anemia    pt denies  . Arthritis   . Atrial fibrillation ([place])   . Cataract    bil  . Cystocele    history with rectocele, pt denies  . Dysrhythmia   . GERD (gastroesophageal reflux disease)   . H/O hiatal hernia   . Hemorrhoid   . History of pancreatitis    pt denies  . Hypothyroidism   . Non Hodgkin's lymphoma ([place])   . Osteoporosis   . Pacemaker 09/15/2011   Medtronic Revo  . Pericarditis   . Pleural effusion, bilateral    history of  . Pulmonary nodules    history of  . Scoliosis   . Shortness of breath    with exertion  . Varicose vein     Past Surgical History:  Procedure Laterality Date  .  ABDOMINAL HYSTERECTOMY  04/29/04   partial  . BREAST SURGERY  07/30/01   biopsy x 3, bilateral  . COLONOSCOPY    . EYE SURGERY     cataracts  . MASS EXCISION Right 10/28/2012   Procedure: Removal of mass on neck       ;  Surgeon: Adin Hector, MD;  Location: [facility];  Service: General;  Laterality: Right;  . PACEMAKER INSERTION  09/15/11   MDT  . PERMANENT PACEMAKER INSERTION N/A 09/15/2011   Procedure: PERMANENT PACEMAKER INSERTION;  Surgeon: Sanda Klein, MD;  Location: [facility] CATH [facility];  Service: Cardiovascular;  Laterality: N/A;  . TONSILLECTOMY      Current Medications: Outpatient Medications Prior to Visit  Medication Sig Dispense Refill  . acetaminophen (TYLENOL) 500 MG tablet Take 500 mg by mouth every 6 (six) hours as needed for pain.    [name] ALPRAZolam (XANAX) 0.5 MG tablet take 1 tablet by mouth at bedtime if needed for sleep 30 tablet 0  . Calcium Carbonate-Vitamin D (CALTRATE 600+D) 600-400 MG-UNIT per tablet Take 1 tablet by mouth 2 (two) times daily.    [name] denosumab (PROLIA) 60 MG/ML SOLN injection Inject 60 mg into the skin every 6 (six) months. Reported on 10/16/2015    . diltiazem (CARDIZEM CD) 180 MG 24 hr capsule Take 1 capsule (180 mg total) by mouth daily. 30 capsule 11  . gabapentin (NEURONTIN) 300 MG capsule  Take 1 capsule (300 mg total) by mouth 3 (three) times daily. 90 capsule 3  . HIZENTRA 4 GM/20ML SOLN Every 6 weeks    . KRILL OIL OMEGA-3 PO Take 1 capsule by mouth daily.    [name] levothyroxine (SYNTHROID, LEVOTHROID) 100 MCG tablet Take 1 tablet (100 mcg total) by mouth daily. 90 tablet 1  . omeprazole (PRILOSEC) 40 MG capsule take 1 capsule by mouth once daily 30 capsule 5  . pantoprazole (PROTONIX) 40 MG tablet Take 1 tablet (40 mg total) by mouth daily. 30 tablet 5  . potassium chloride (K-DUR) 10 MEQ tablet Take 1 tablet (10 mEq total) by mouth daily. 90 tablet 3  . traMADol (ULTRAM) 50 MG tablet Take 1 tablet by mouth daily as needed.    . furosemide (LASIX) 20 MG  tablet Take 2 tablets (40 mg total) by mouth daily. Take 1-2 tablets (20-40 mg total) by mouth daily as directed. 270 tablet 3   No facility-administered medications prior to visit.      Allergies:   Amoxicillin; Buprenorphine; Codeine; Demerol; Morphine and related; Sulfonamide derivatives; and Vicodin [hydrocodone-acetaminophen]   Social History   Social History  . Marital status: Widowed    Spouse name: N/A  . Number of children: 3  . Years of education: N/A   Occupational History  . self employed    Social History Main Topics  . Smoking status: Never Smoker  . Smokeless tobacco: Never Used  . Alcohol use Yes     Comment: 1 glass of wine occasionally  . Drug use: No  . Sexual activity: Not Asked   Other Topics Concern  . 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  She lives alone in a 3-story home.  There is a lift chair which she does not use (placed for her husband).     Family History:  The patient's family history includes Breast cancer in her sister; Heart disease in her brother and daughter; Lymphoma in her daughter; Stroke in her father and mother.   ROS:   Please see the history of present illness.    ROS All other systems reviewed and are negative.   PHYSICAL EXAM:   VS:  BP 120/60   Pulse 74   Ht 5\' 4"  (1.626 m)   Wt 61.7 kg (136 lb)   BMI 23.34 kg/m    GEN: Well nourished, well developed, in no acute distress  HEENT: normal  Neck: no JVD, carotid bruits, or masses Cardiac: RRR; no murmurs, rubs, or gallops,no edema , healthy left subclavian pacemaker site Respiratory:  clear to auscultation bilaterally, normal work of breathing GI: soft, nontender, nondistended, + BS MS: no deformity or atrophy  Skin: warm and dry, no rash Neuro:  Alert and Oriented x 3, Strength and sensation are intact Psych: euthymic mood, full affect  Wt Readings from Last 3 Encounters:    09/15/16 61.7 kg (136 lb)  08/05/16 62.8 kg (138 lb 6.4 oz)  06/11/16 63 kg (138 lb 12.8 oz)      Studies/Labs Reviewed:   EKG:  EKG is not ordered today.  The ekg ordered today demonstrates Atrial paced-ventricular sensed rhythm, no acute repolarization changes, QTc 417 ms  Recent Labs: 03/31/2016: Magnesium 1.8; TSH 1.62 04/16/2016: Pro B Natriuretic peptide (BNP) 119.0 06/04/2016: ALT 21; HGB 14.0; Platelets 208 08/05/2016: BUN 24; Creatinine, Ser 0.67; Potassium 3.7; Sodium 140   Additional studies/ records that  were reviewed today include:  Pacemaker check report from February 4  ASSESSMENT:    1. Chronic diastolic heart failure ([place])   2. Sinus bradycardia   3. Pacemaker   4. Pericardial effusion after pacemaker, hospitalized at [facility] 09/25/11      PLAN:  In order of problems listed above:  1. CHF:  The acute exacerbation may have been related to inadvertent increased sodium intake during her travel. Diastolic dysfunction" has been documented on her echoes for the last 7 years, but this was the first clinical event. Reviewed the importance of sodium restriction and daily weight monitoring, paying particular attention when she is traveling, as sodium intake is likely to increase. Also reviewed the signs and symptoms of heart failure and the fact that she can adjust the dose of diuretic if she notes rapid weight gain. Will decrease the dose of daily diuretic to 20 mg since she has complaints of muscle cramps. Will also add daily magnesium oxide supplement. 2. SSS: Heart rate distribution suggests satisfactory treatment for sinus node dysfunction.  3. PM: Normal pacemaker function. Continue remote downloads every 3 months 4. History of post procedural acute pericarditis after pacemaker implantation, resolved. Had atrial fibrillation related to acute pericarditis, which has not recurred in years. No evidence of constrictive physiology on current echo. She has not received  chest radiation therapy for lymphoma, just chemotherapy.  Medication Adjustments/Labs and Tests Ordered: Current medicines are reviewed at length with the patient today.  Concerns regarding medicines are outlined above.  Medication changes, Labs and Tests ordered today are listed in the Patient Instructions below. Patient Instructions  Dr Sallyanne Kuster has recommended making the following medication changes: 1. DECREASE Furosemide to 20 mg - take 1 tablet by mouth daily 2. START over-the-counter Magnesium Oxide 400 mg - take 1 tablet daily  Remote monitoring is used to monitor your Pacemaker of ICD from home. This monitoring reduces the number of office visits required to check your device to one time per year. It allows [place] to keep an eye on the functioning of your device to ensure it is working properly. You are scheduled for a device check from home on Tuesday, June 19th, 2018. You may send your transmission at any time that day. If you have a wireless device, the transmission will be sent automatically. After your physician reviews your transmission, you will receive a postcard with your next transmission date.  Dr Sallyanne Kuster recommends that you schedule a follow-up appointment in 6 months with a pacemaker check. You will receive a reminder letter in the mail two months in advance. If you don't receive a letter, please call our office to schedule the follow-up appointment.  If you need a refill on your cardiac medications before your next appointment, please call your pharmacy.    Signed, Sanda Klein, MD  09/19/2016 2:16 PM    [facility] [address] [place], [place], [place]  [address] Phone: [phone]; Fax: [phone]

## 2016-09-16 NOTE — Telephone Encounter (Signed)
Pt signed CSC and original placed in folder in the lab.

## 2016-09-18 DIAGNOSIS — N3000 Acute cystitis without hematuria: Secondary | ICD-10-CM | POA: Diagnosis not present

## 2016-09-18 DIAGNOSIS — N3090 Cystitis, unspecified without hematuria: Secondary | ICD-10-CM | POA: Diagnosis not present

## 2016-10-08 ENCOUNTER — Ambulatory Visit (INDEPENDENT_AMBULATORY_CARE_PROVIDER_SITE_OTHER): Payer: Medicare Other | Admitting: Medical

## 2016-10-08 ENCOUNTER — Encounter: Payer: Self-pay | Admitting: Medical

## 2016-10-08 VITALS — BP 105/73 | HR 82 | Temp 98.3°F | Resp 16 | Ht 64.0 in | Wt 135.8 lb

## 2016-10-08 DIAGNOSIS — R300 Dysuria: Secondary | ICD-10-CM | POA: Diagnosis not present

## 2016-10-08 DIAGNOSIS — R309 Painful micturition, unspecified: Secondary | ICD-10-CM | POA: Diagnosis not present

## 2016-10-08 LAB — POC URINALSYSI DIPSTICK (AUTOMATED)
Bilirubin, UA: NEGATIVE
Blood, UA: NEGATIVE
GLUCOSE UA: NEGATIVE
KETONES UA: NEGATIVE
Nitrite, UA: NEGATIVE
Protein, UA: NEGATIVE
SPEC GRAV UA: 1.025 (ref 1.010–1.025)
Urobilinogen, UA: NEGATIVE E.U./dL — AB
pH, UA: 6 (ref 5.0–8.0)

## 2016-10-08 MED ORDER — NITROFURANTOIN MONOHYD MACRO 100 MG PO CAPS: 100.0000 mg | ORAL_CAPSULE | Freq: Two times a day (BID) | ORAL | 0 refills | Status: DC

## 2016-10-08 NOTE — Progress Notes (Signed)
Pre visit review using our clinic review tool, if applicable. No additional management support is needed unless otherwise documented below in the visit note. 

## 2016-10-08 NOTE — Progress Notes (Signed)
Subjective:    Patient ID: [name], female    DOB: 1934-08-02, 82 y.o.   MRN: [id number]  HPI  Pt in today reporting urinary symptoms x 2 wks. Symptoms on and off.  Dysuria- yes(severe for 2-3 days) Frequent urination-yes Hesitancy-no Suprapubic pressure-yes Fever-no chills-no Nausea-no Vomiting-no CVA pain-left side History of UTI- every 2 years or so uti. Gross hematuria-no  Review of Systems  Constitutional: Negative for chills, fatigue and fever.  Respiratory: Negative for shortness of breath and wheezing.   Cardiovascular: Negative for chest pain and palpitations.  Gastrointestinal: Negative for abdominal pain, anal bleeding, constipation, diarrhea and vomiting.  Genitourinary: Positive for dysuria, frequency and urgency. Negative for difficulty urinating, pelvic pain, vaginal discharge and vaginal pain.  Musculoskeletal: Negative for back pain.       Reported some but on exam none.   Past Medical History:  Diagnosis Date  . Acute bronchitis 10/09/2012  . Anemia    pt denies  . Arthritis   . Atrial fibrillation ([place])   . Cataract    bil  . Cystocele    history with rectocele, pt denies  . Dysrhythmia   . GERD (gastroesophageal reflux disease)   . H/O hiatal hernia   . Hemorrhoid   . History of pancreatitis    pt denies  . Hypothyroidism   . Non Hodgkin's lymphoma ([place])   . Osteoporosis   . Pacemaker 09/15/2011   Medtronic Revo  . Pericarditis   . Pleural effusion, bilateral    history of  . Pulmonary nodules    history of  . Scoliosis   . Shortness of breath    with exertion  . Varicose vein      Social History   Social History  . Marital status: Widowed    Spouse name: N/A  . Number of children: 3  . Years of education: N/A   Occupational History  . self employed    Social History Main Topics  . Smoking status: Never Smoker  . Smokeless tobacco: Never Used  . Alcohol use Yes     Comment: 1 glass of wine occasionally  . Drug  use: No  . Sexual activity: Not on file   Other Topics Concern  . 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  She lives alone in a 3-story home.  There is a lift chair which she does not use (placed for her husband).    Past Surgical History:  Procedure Laterality Date  . ABDOMINAL HYSTERECTOMY  04/29/04   partial  . BREAST SURGERY  07/30/01   biopsy x 3, bilateral  . COLONOSCOPY    . EYE SURGERY     cataracts  . MASS EXCISION Right 10/28/2012   Procedure: Removal of mass on neck       ;  Surgeon: Adin Hector, MD;  Location: [facility];  Service: General;  Laterality: Right;  . PACEMAKER INSERTION  09/15/11   MDT  . PERMANENT PACEMAKER INSERTION N/A 09/15/2011   Procedure: PERMANENT PACEMAKER INSERTION;  Surgeon: Sanda Klein, MD;  Location: [facility] CATH LAB;  Service: Cardiovascular;  Laterality: N/A;  . TONSILLECTOMY      Family History  Problem Relation Age of Onset  . Stroke Mother   . Stroke Father   . Heart disease Daughter     congenital "whole in her heart"  . Lymphoma Daughter     nonhodgkins  .  Breast cancer Sister   . Heart disease Brother     Allergies  Allergen Reactions  . Amoxicillin Hives    Hives  . Buprenorphine Hives  . Codeine Hives    REACTION: "makes her crazy" Hives  . Demerol Hives  . Morphine And Related Hives  . Sulfonamide Derivatives Rash    REACTION: Rash  . Vicodin [Hydrocodone-Acetaminophen] Hives    Current Outpatient Prescriptions on File Prior to Visit  Medication Sig Dispense Refill  . acetaminophen (TYLENOL) 500 MG tablet Take 500 mg by mouth every 6 (six) hours as needed for pain.    [name] ALPRAZolam (XANAX) 0.5 MG tablet take 1 tablet by mouth at bedtime if needed for sleep 30 tablet 0  . Calcium Carbonate-Vitamin D (CALTRATE 600+D) 600-400 MG-UNIT per tablet Take 1 tablet by mouth 2 (two) times daily.    [name] denosumab (PROLIA) 60 MG/ML SOLN  injection Inject 60 mg into the skin every 6 (six) months. Reported on 10/16/2015    . diltiazem (CARDIZEM CD) 180 MG 24 hr capsule Take 1 capsule (180 mg total) by mouth daily. 30 capsule 11  . furosemide (LASIX) 20 MG tablet Take 1 tablet (20 mg total) by mouth daily. 90 tablet 3  . gabapentin (NEURONTIN) 300 MG capsule Take 1 capsule (300 mg total) by mouth 3 (three) times daily. 90 capsule 3  . HIZENTRA 4 GM/20ML SOLN Every 6 weeks    . KRILL OIL OMEGA-3 PO Take 1 capsule by mouth daily.    [name] levothyroxine (SYNTHROID, LEVOTHROID) 100 MCG tablet Take 1 tablet (100 mcg total) by mouth daily. 90 tablet 1  . Magnesium Oxide 400 (240 Mg) MG TABS Take 1 tablet (400 mg total) by mouth daily. 30 tablet   . omeprazole (PRILOSEC) 40 MG capsule take 1 capsule by mouth once daily 30 capsule 5  . pantoprazole (PROTONIX) 40 MG tablet Take 1 tablet (40 mg total) by mouth daily. 30 tablet 5  . potassium chloride (K-DUR) 10 MEQ tablet Take 1 tablet (10 mEq total) by mouth daily. 90 tablet 3  . traMADol (ULTRAM) 50 MG tablet Take 1 tablet by mouth daily as needed.     No current facility-administered medications on file prior to visit.     BP 105/73 (BP Location: Left Arm, Patient Position: Sitting, Cuff Size: Normal)   Pulse 82   Temp 98.3 F (36.8 C) (Oral)   Resp 16   Ht 5\' 4"  (1.626 m)   Wt 135 lb 12.8 oz (61.6 kg)   SpO2 98%   BMI 23.31 kg/m       Objective:   Physical Exam  General Appearance- Not in acute distress.  HEENT Eyes- Scleraeral/Conjuntiva-bilat- Not Yellow. Mouth & Throat- Normal.  Chest and Lung Exam Auscultation: Breath sounds:-Normal. Adventitious sounds:- No Adventitious sounds.  Cardiovascular Auscultation:Rythm - Regular. Heart Sounds -Normal heart sounds.  Abdomen Inspection:-Inspection Normal.  Palpation/Perucssion: Palpation and Percussion of the abdomen reveal- faint suprapubic Tenderness, No Rebound tenderness, No rigidity(Guarding) and No Palpable  abdominal masses.  Liver:-Normal.  Spleen:- Normal.   Back- no cva tenderness       Assessment & Plan:  You appear to have a urinary tract infection. I am prescribing  macrobid antibiotic for the probable infection. Hydrate well. I am sending out a urine culture. During the interim if your signs and symptoms worsen rather than improving please notify us. We will notify your when the culture results are back.  Continue azostandard.  Follow up in 7  days or as needed.

## 2016-10-08 NOTE — Patient Instructions (Addendum)
You appear to have a urinary tract infection. I am prescribing  macrobid antibiotic for the probable infection. Hydrate well. I am sending out a urine culture. During the interim if your signs and symptoms worsen rather than improving please notify us. We will notify your when the culture results are back.  Continue azostandard.  Follow up in 7 days or as needed.

## 2016-10-11 LAB — URINE CULTURE

## 2016-10-13 ENCOUNTER — Encounter: Payer: Self-pay | Admitting: Gastroenterology

## 2016-10-29 ENCOUNTER — Other Ambulatory Visit: Payer: Self-pay | Admitting: Family

## 2016-11-02 ENCOUNTER — Other Ambulatory Visit: Payer: Self-pay | Admitting: Family

## 2016-11-03 DIAGNOSIS — M545 Low back pain: Secondary | ICD-10-CM | POA: Diagnosis not present

## 2016-11-03 DIAGNOSIS — M791 Myalgia: Secondary | ICD-10-CM | POA: Diagnosis not present

## 2016-11-03 DIAGNOSIS — M4126 Other idiopathic scoliosis, lumbar region: Secondary | ICD-10-CM | POA: Diagnosis not present

## 2016-11-03 DIAGNOSIS — G894 Chronic pain syndrome: Secondary | ICD-10-CM | POA: Diagnosis not present

## 2016-11-03 NOTE — Telephone Encounter (Signed)
eScribe request from [facility] for refill on Levothyroxine Last filled - 05/06/17, #90x1 Last AEX - 08/05/16 Next AEX - 6-Mths, pt has appointment 12/02/16 Refill sent per [facility] refill protocol/SLS

## 2016-11-03 NOTE — Telephone Encounter (Signed)
eScribe request from [facility] for refill on Alprazolam .5 mg tab Last filled - 09/15/16, #30x0 Last AEX - 08/05/16 Next AEX - 4-Mths. [Appt 12/02/16] Last UDS - 03/31/16, Moderate Please Advise on refills/SLS 05/08

## 2016-11-05 NOTE — Telephone Encounter (Signed)
Ok to refill #30

## 2016-11-10 ENCOUNTER — Other Ambulatory Visit: Payer: Self-pay | Admitting: Family

## 2016-11-11 NOTE — Telephone Encounter (Signed)
Rx sent to the pharmacy by e-script.//AB/CMA 

## 2016-12-02 ENCOUNTER — Ambulatory Visit: Payer: Medicare Other | Admitting: Family

## 2016-12-09 ENCOUNTER — Telehealth: Payer: Self-pay | Admitting: Family

## 2016-12-09 NOTE — Telephone Encounter (Signed)
Left message on pt's voice mail to return my call

## 2016-12-09 NOTE — Telephone Encounter (Signed)
Prolia benefits verified NO PA required 20% co-insurance Prolia $5 copay for OV  Patient may owe approximately $200 OOP  Due after 11/29/16

## 2016-12-10 ENCOUNTER — Other Ambulatory Visit (HOSPITAL_BASED_OUTPATIENT_CLINIC_OR_DEPARTMENT_OTHER): Payer: Medicare Other

## 2016-12-10 ENCOUNTER — Other Ambulatory Visit: Payer: Medicare Other

## 2016-12-10 DIAGNOSIS — C8221 Follicular lymphoma grade III, unspecified, lymph nodes of head, face, and neck: Secondary | ICD-10-CM | POA: Diagnosis not present

## 2016-12-10 LAB — CBC WITH DIFFERENTIAL/PLATELET
BASO%: 1.3 % (ref 0.0–2.0)
BASOS ABS: 0.1 10*3/uL (ref 0.0–0.1)
EOS%: 3 % (ref 0.0–7.0)
Eosinophils Absolute: 0.2 10*3/uL (ref 0.0–0.5)
HEMATOCRIT: 43.6 % (ref 34.8–46.6)
HEMOGLOBIN: 14.6 g/dL (ref 11.6–15.9)
LYMPH%: 30.6 % (ref 14.0–49.7)
MCH: 31.9 pg (ref 25.1–34.0)
MCHC: 33.5 g/dL (ref 31.5–36.0)
MCV: 95.1 fL (ref 79.5–101.0)
MONO#: 0.7 10*3/uL (ref 0.1–0.9)
MONO%: 9.8 % (ref 0.0–14.0)
NEUT#: 3.7 10*3/uL (ref 1.5–6.5)
NEUT%: 55.3 % (ref 38.4–76.8)
Platelets: 241 10*3/uL (ref 145–400)
RBC: 4.58 10*6/uL (ref 3.70–5.45)
RDW: 14 % (ref 11.2–14.5)
WBC: 6.8 10*3/uL (ref 3.9–10.3)
lymph#: 2.1 10*3/uL (ref 0.9–3.3)

## 2016-12-10 LAB — COMPREHENSIVE METABOLIC PANEL
ALBUMIN: 3.8 g/dL (ref 3.5–5.0)
ALK PHOS: 58 U/L (ref 40–150)
ALT: 16 U/L (ref 0–55)
AST: 26 U/L (ref 5–34)
Anion Gap: 8 mEq/L (ref 3–11)
BUN: 13.2 mg/dL (ref 7.0–26.0)
CALCIUM: 9.6 mg/dL (ref 8.4–10.4)
CHLORIDE: 104 meq/L (ref 98–109)
CO2: 28 mEq/L (ref 22–29)
Creatinine: 0.9 mg/dL (ref 0.6–1.1)
EGFR: 63 mL/min/{1.73_m2} — AB (ref >90)
Glucose: 88 mg/dl (ref 70–140)
POTASSIUM: 3.9 meq/L (ref 3.5–5.1)
Sodium: 140 mEq/L (ref 136–145)
Total Bilirubin: 0.38 mg/dL (ref 0.20–1.20)
Total Protein: 7.2 g/dL (ref 6.4–8.3)

## 2016-12-10 LAB — LACTATE DEHYDROGENASE: LDH: 380 U/L — AB (ref 125–245)

## 2016-12-15 ENCOUNTER — Telehealth: Payer: Self-pay | Admitting: Cardiology

## 2016-12-15 ENCOUNTER — Ambulatory Visit (INDEPENDENT_AMBULATORY_CARE_PROVIDER_SITE_OTHER): Payer: Medicare Other | Admitting: *Deleted

## 2016-12-15 DIAGNOSIS — I495 Sick sinus syndrome: Secondary | ICD-10-CM

## 2016-12-15 NOTE — Telephone Encounter (Signed)
LMOVM reminding pt to send remote transmission.   

## 2016-12-15 NOTE — Telephone Encounter (Signed)
Left message for pt to return my call. Medication was received and is in fridge for pt if she decides to proceed with injection.

## 2016-12-16 DIAGNOSIS — H50012 Monocular esotropia, left eye: Secondary | ICD-10-CM | POA: Diagnosis not present

## 2016-12-16 DIAGNOSIS — H5022 Vertical strabismus, left eye: Secondary | ICD-10-CM | POA: Diagnosis not present

## 2016-12-16 NOTE — Telephone Encounter (Signed)
Notified pt and she is agreeable to proceed with injection. Nurse visit scheduled for 12/22/16 at 4pm.

## 2016-12-17 ENCOUNTER — Encounter: Payer: Self-pay | Admitting: Internal Medicine

## 2016-12-17 ENCOUNTER — Ambulatory Visit (HOSPITAL_BASED_OUTPATIENT_CLINIC_OR_DEPARTMENT_OTHER): Payer: Medicare Other | Admitting: Internal Medicine

## 2016-12-17 VITALS — BP 127/107 | HR 84 | Temp 97.9°F | Resp 17 | Ht 64.0 in | Wt 134.6 lb

## 2016-12-17 DIAGNOSIS — Z8572 Personal history of non-Hodgkin lymphomas: Secondary | ICD-10-CM | POA: Diagnosis not present

## 2016-12-17 DIAGNOSIS — C8221 Follicular lymphoma grade III, unspecified, lymph nodes of head, face, and neck: Secondary | ICD-10-CM

## 2016-12-17 LAB — CUP PACEART REMOTE DEVICE CHECK
Date Time Interrogation Session: 20180619202056
Implantable Lead Implant Date: 20130319
Implantable Lead Location: 753859
Implantable Lead Location: 753860
Implantable Pulse Generator Implant Date: 20130319
Lead Channel Impedance Value: 400 Ohm
Lead Channel Sensing Intrinsic Amplitude: 16.715 mV
Lead Channel Setting Pacing Amplitude: 2 V
MDC IDC LEAD IMPLANT DT: 20130319
MDC IDC MSMT BATTERY VOLTAGE: 2.97 V
MDC IDC MSMT LEADCHNL RA SENSING INTR AMPL: 1.641 mV
MDC IDC MSMT LEADCHNL RV IMPEDANCE VALUE: 496 Ohm
MDC IDC SET LEADCHNL RV SENSING SENSITIVITY: 0.9 mV
MDC IDC STAT BRADY RA PERCENT PACED: 42.3 %

## 2016-12-17 NOTE — Progress Notes (Signed)
[place] Telephone:([phone]   [phone]  OFFICE PROGRESS NOTE  [name], NP [place] [address]  DIAGNOSIS: Recurrent non-Hodgkin lymphoma, follicular center cell type with predominant follicular pattern favor high-grade (grade 3/3) diagnosed in November 2012.  PRIOR THERAPY: 1) Status post treatment with Rituxan weekly for 3 doses in addition to 3 tablets of Afinitor at [facility] in [place].  2) Status post surgical excision of the right neck mass under the care of Dr. Johney Maine on 10/28/2012.  3)  Maintenance Rituxan 375 mg/M2 every 2 months status post 12 cycles, last dose was given 10/01/2014.  CURRENT THERAPY: Observation.  INTERVAL HISTORY: [name] 82 y.o. female returns to the clinic today for follow-up visit. The patient is feeling fine today was no specific complaints. She denied having any weight loss or night sweats. She has no palpable lymphadenopathy. She has no nausea, vomiting, diarrhea or constipation. She denied having any chest pain but has occasional shortness breath with no cough or hemoptysis. She is here today for evaluation and repeat blood work.  MEDICAL HISTORY: Past Medical History:  Diagnosis Date  . Acute bronchitis 10/09/2012  . Anemia    pt denies  . Arthritis   . Atrial fibrillation ([place])   . Cataract    bil  . Cystocele    history with rectocele, pt denies  . Dysrhythmia   . GERD (gastroesophageal reflux disease)   . H/O hiatal hernia   . Hemorrhoid   . History of pancreatitis    pt denies  . Hypothyroidism   . Non Hodgkin's lymphoma ([place])   . Osteoporosis   . Pacemaker 09/15/2011   Medtronic Revo  . Pericarditis   . Pleural effusion, bilateral    history of  . Pulmonary nodules    history of  . Scoliosis   . Shortness of breath    with exertion  . Varicose vein     ALLERGIES:  is allergic to amoxicillin; buprenorphine; codeine; demerol;  morphine and related; sulfonamide derivatives; and vicodin [hydrocodone-acetaminophen].  MEDICATIONS:  Current Outpatient Prescriptions  Medication Sig Dispense Refill  . acetaminophen (TYLENOL) 500 MG tablet Take 500 mg by mouth every 6 (six) hours as needed for pain.    [name] ALPRAZolam (XANAX) 0.5 MG tablet take 1 tablet by mouth at bedtime if needed for sleep 30 tablet 0  . Calcium Carbonate-Vitamin D (CALTRATE 600+D) 600-400 MG-UNIT per tablet Take 1 tablet by mouth 2 (two) times daily.    [name] denosumab (PROLIA) 60 MG/ML SOLN injection Inject 60 mg into the skin every 6 (six) months. Reported on 10/16/2015    . diltiazem (CARDIZEM CD) 180 MG 24 hr capsule Take 1 capsule (180 mg total) by mouth daily. 30 capsule 11  . furosemide (LASIX) 20 MG tablet Take 1 tablet (20 mg total) by mouth daily. 90 tablet 3  . gabapentin (NEURONTIN) 300 MG capsule Take 1 capsule (300 mg total) by mouth 3 (three) times daily. 90 capsule 3  . HIZENTRA 4 GM/20ML SOLN Every 6 weeks    . KRILL OIL OMEGA-3 PO Take 1 capsule by mouth daily.    [name] levothyroxine (SYNTHROID, LEVOTHROID) 100 MCG tablet take 1 tablet by mouth once daily 90 tablet 0  . Magnesium Oxide 400 (240 Mg) MG TABS Take 1 tablet (400 mg total) by mouth daily. 30 tablet   . nitrofurantoin, macrocrystal-monohydrate, (MACROBID) 100 MG capsule Take 1 capsule (  100 mg total) by mouth 2 (two) times daily. 14 capsule 0  . omeprazole (PRILOSEC) 40 MG capsule take 1 capsule by mouth once daily 30 capsule 5  . potassium chloride (K-DUR) 10 MEQ tablet Take 1 tablet (10 mEq total) by mouth daily. 90 tablet 3  . traMADol (ULTRAM) 50 MG tablet Take 1 tablet by mouth daily as needed.     No current facility-administered medications for this visit.     SURGICAL HISTORY:  Past Surgical History:  Procedure Laterality Date  . ABDOMINAL HYSTERECTOMY  04/29/04   partial  . BREAST SURGERY  07/30/01   biopsy x 3, bilateral  . COLONOSCOPY    . EYE SURGERY     cataracts  .  MASS EXCISION Right 10/28/2012   Procedure: Removal of mass on neck       ;  Surgeon: Adin Hector, MD;  Location: [place];  Service: General;  Laterality: Right;  . PACEMAKER INSERTION  09/15/11   MDT  . PERMANENT PACEMAKER INSERTION N/A 09/15/2011   Procedure: PERMANENT PACEMAKER INSERTION;  Surgeon: Sanda Klein, MD;  Location: [place] CATH LAB;  Service: Cardiovascular;  Laterality: N/A;  . TONSILLECTOMY      REVIEW OF SYSTEMS:  A comprehensive review of systems was negative except for: Respiratory: positive for dyspnea on exertion   PHYSICAL EXAMINATION: General appearance: alert, cooperative and no distress Head: Normocephalic, without obvious abnormality, atraumatic Neck: no adenopathy, no JVD, supple, symmetrical, trachea midline and thyroid not enlarged, symmetric, no tenderness/mass/nodules Lymph nodes: Cervical, supraclavicular, and axillary nodes normal. Resp: clear to auscultation bilaterally Back: symmetric, no curvature. ROM normal. No CVA tenderness. Cardio: regular rate and rhythm, S1, S2 normal, no murmur, click, rub or gallop GI: soft, non-tender; bowel sounds normal; no masses,  no organomegaly Extremities: extremities normal, atraumatic, no cyanosis or edema  ECOG PERFORMANCE STATUS: 1 - Symptomatic but completely ambulatory  Blood pressure (!) 127/107, pulse 84, temperature 97.9 F (36.6 C), temperature source Oral, resp. rate 17, height 5\' 4"  (1.626 m), weight 134 lb 9.6 oz (61.1 kg), SpO2 95 %.  LABORATORY DATA: Lab Results  Component Value Date   WBC 6.8 12/10/2016   HGB 14.6 12/10/2016   HCT 43.6 12/10/2016   MCV 95.1 12/10/2016   PLT 241 12/10/2016      Chemistry      Component Value Date/Time   NA 140 12/10/2016 1453   K 3.9 12/10/2016 1453   CL 102 08/05/2016 1348   CL 109 (H) 12/14/2012 1009   CO2 28 12/10/2016 1453   BUN 13.2 12/10/2016 1453   CREATININE 0.9 12/10/2016 1453      Component Value Date/Time   CALCIUM 9.6 12/10/2016 1453   ALKPHOS  58 12/10/2016 1453   AST 26 12/10/2016 1453   ALT 16 12/10/2016 1453   BILITOT 0.38 12/10/2016 1453       RADIOGRAPHIC STUDIES: No results found.  ASSESSMENT AND PLAN:  This is a very pleasant 82 years old white female with recurrent non-Hodgkin lymphoma, follicular center cell type status post treatment with Rituxan as well as Afinitor at [facility] and also completed a course of maintenance Rituxan for 12 cycles last dose was given 10/01/2014. The patient has been observation since that time. Her CBC and comprehensive metabolic panel are unremarkable today but LDH is still elevated. I recommended for the patient to continue on observation with repeat blood work and CT scan of the chest, abdomen and pelvis in 6 months. She was advised  to call immediately if she has any concerning symptoms in the interval. The patient voices understanding of current disease status and treatment options and is in agreement with the current care plan. All questions were answered. The patient knows to call the clinic with any problems, questions or concerns. We can certainly see the patient much sooner if necessary.   Disclaimer: This note was dictated with voice recognition software. Similar sounding words can inadvertently be transcribed and may not be corrected upon review.

## 2016-12-17 NOTE — Progress Notes (Signed)
Remote pacemaker transmission.   

## 2016-12-18 ENCOUNTER — Encounter: Payer: Self-pay | Admitting: Cardiology

## 2016-12-18 ENCOUNTER — Telehealth: Payer: Self-pay | Admitting: Internal Medicine

## 2016-12-18 NOTE — Telephone Encounter (Signed)
Scheduled appt per 6/21 los - lab/ct/ and f/u in 6 months. Reminder letter sent in the mail.

## 2016-12-22 ENCOUNTER — Ambulatory Visit (INDEPENDENT_AMBULATORY_CARE_PROVIDER_SITE_OTHER): Payer: Medicare Other

## 2016-12-22 DIAGNOSIS — M816 Localized osteoporosis [Lequesne]: Secondary | ICD-10-CM | POA: Diagnosis not present

## 2016-12-22 MED ORDER — DENOSUMAB 60 MG/ML ~~LOC~~ SOLN
60.0000 mg | Freq: Once | SUBCUTANEOUS | Status: AC
  Administered 2016-12-22: 60 mg via SUBCUTANEOUS

## 2016-12-22 NOTE — Progress Notes (Signed)
Pre visit review using our clinic tool,if applicable. No additional management support is needed unless otherwise documented below in the visit note.  Patient in for Prolia injection. Given 60 mg SQ left arm . Patient tolerated well.

## 2016-12-23 NOTE — Progress Notes (Signed)
Noted  

## 2016-12-31 DIAGNOSIS — M4126 Other idiopathic scoliosis, lumbar region: Secondary | ICD-10-CM | POA: Diagnosis not present

## 2016-12-31 DIAGNOSIS — Z79899 Other long term (current) drug therapy: Secondary | ICD-10-CM | POA: Diagnosis not present

## 2016-12-31 DIAGNOSIS — M47816 Spondylosis without myelopathy or radiculopathy, lumbar region: Secondary | ICD-10-CM | POA: Diagnosis not present

## 2016-12-31 DIAGNOSIS — G894 Chronic pain syndrome: Secondary | ICD-10-CM | POA: Diagnosis not present

## 2016-12-31 DIAGNOSIS — Z79891 Long term (current) use of opiate analgesic: Secondary | ICD-10-CM | POA: Diagnosis not present

## 2016-12-31 DIAGNOSIS — M791 Myalgia: Secondary | ICD-10-CM | POA: Diagnosis not present

## 2017-01-06 ENCOUNTER — Telehealth: Payer: Self-pay | Admitting: *Deleted

## 2017-01-06 MED ORDER — ALPRAZOLAM 0.5 MG PO TABS: ORAL_TABLET | ORAL | 0 refills | Status: DC

## 2017-01-06 NOTE — Telephone Encounter (Signed)
Faxed refill request received from [facility] for Alprazolam 0.5mg  tab Last filled by MD on 11/06/16, #30x0 Last AEX - 08/05/16 [04/12-Acute; 06/26-Prolia] Next AEX - 4-Mths. Last UDS - 03/31/16, moderate, Rx taken PRN Please Advise on refills/SLS 07/11

## 2017-01-06 NOTE — Telephone Encounter (Signed)
See rx. 

## 2017-01-06 NOTE — Telephone Encounter (Signed)
Pt called in to return call. Informed her of message below.

## 2017-01-06 NOTE — Telephone Encounter (Signed)
Rx faxed to pharmacy  

## 2017-02-09 ENCOUNTER — Encounter (HOSPITAL_COMMUNITY): Payer: Self-pay | Admitting: Emergency Medicine

## 2017-02-09 ENCOUNTER — Emergency Department (HOSPITAL_COMMUNITY): Payer: No Typology Code available for payment source

## 2017-02-09 ENCOUNTER — Emergency Department (HOSPITAL_COMMUNITY)
Admission: EM | Admit: 2017-02-09 | Discharge: 2017-02-09 | Disposition: A | Discharge status: Home / Self Care | Payer: No Typology Code available for payment source | Attending: Emergency Medicine | Admitting: Emergency Medicine

## 2017-02-09 DIAGNOSIS — S00211A Abrasion of right eyelid and periocular area, initial encounter: Secondary | ICD-10-CM | POA: Insufficient documentation

## 2017-02-09 DIAGNOSIS — Z79899 Other long term (current) drug therapy: Secondary | ICD-10-CM | POA: Insufficient documentation

## 2017-02-09 DIAGNOSIS — Z950 Presence of cardiac pacemaker: Secondary | ICD-10-CM | POA: Diagnosis not present

## 2017-02-09 DIAGNOSIS — I5032 Chronic diastolic (congestive) heart failure: Secondary | ICD-10-CM | POA: Insufficient documentation

## 2017-02-09 DIAGNOSIS — S60512A Abrasion of left hand, initial encounter: Secondary | ICD-10-CM | POA: Insufficient documentation

## 2017-02-09 DIAGNOSIS — R0781 Pleurodynia: Secondary | ICD-10-CM | POA: Diagnosis not present

## 2017-02-09 DIAGNOSIS — S50812A Abrasion of left forearm, initial encounter: Secondary | ICD-10-CM | POA: Diagnosis not present

## 2017-02-09 DIAGNOSIS — M79642 Pain in left hand: Secondary | ICD-10-CM | POA: Diagnosis not present

## 2017-02-09 DIAGNOSIS — T07XXXA Unspecified multiple injuries, initial encounter: Secondary | ICD-10-CM

## 2017-02-09 DIAGNOSIS — E039 Hypothyroidism, unspecified: Secondary | ICD-10-CM | POA: Diagnosis not present

## 2017-02-09 DIAGNOSIS — S2242XA Multiple fractures of ribs, left side, initial encounter for closed fracture: Secondary | ICD-10-CM | POA: Insufficient documentation

## 2017-02-09 DIAGNOSIS — T148XXA Other injury of unspecified body region, initial encounter: Secondary | ICD-10-CM | POA: Diagnosis not present

## 2017-02-09 DIAGNOSIS — I110 Hypertensive heart disease with heart failure: Secondary | ICD-10-CM | POA: Diagnosis not present

## 2017-02-09 DIAGNOSIS — Y92410 Unspecified street and highway as the place of occurrence of the external cause: Secondary | ICD-10-CM | POA: Insufficient documentation

## 2017-02-09 DIAGNOSIS — S2241XA Multiple fractures of ribs, right side, initial encounter for closed fracture: Secondary | ICD-10-CM | POA: Diagnosis not present

## 2017-02-09 DIAGNOSIS — Y998 Other external cause status: Secondary | ICD-10-CM | POA: Diagnosis not present

## 2017-02-09 DIAGNOSIS — Y9389 Activity, other specified: Secondary | ICD-10-CM | POA: Insufficient documentation

## 2017-02-09 MED ORDER — TRAMADOL HCL 50 MG PO TABS: 50.0000 mg | ORAL_TABLET | Freq: Four times a day (QID) | ORAL | 0 refills | Status: DC | PRN

## 2017-02-09 MED ORDER — IBUPROFEN 400 MG PO TABS
400.0000 mg | ORAL_TABLET | Freq: Once | ORAL | Status: AC
  Administered 2017-02-09: 400 mg via ORAL
  Filled 2017-02-09: qty 1

## 2017-02-09 MED ORDER — TRAMADOL HCL 50 MG PO TABS
50.0000 mg | ORAL_TABLET | Freq: Once | ORAL | Status: AC
  Administered 2017-02-09: 50 mg via ORAL
  Filled 2017-02-09: qty 1

## 2017-02-09 MED ORDER — IBUPROFEN 400 MG PO TABS: 400.0000 mg | ORAL_TABLET | Freq: Three times a day (TID) | ORAL | 0 refills | Status: DC | PRN
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
## 2017-02-09 NOTE — ED Notes (Signed)
ED Provider at bedside. 

## 2017-02-09 NOTE — ED Notes (Signed)
Pt given paper scrub top at discharge.
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
## 2017-02-16 ENCOUNTER — Telehealth: Payer: Self-pay | Admitting: Family

## 2017-02-16 ENCOUNTER — Ambulatory Visit (INDEPENDENT_AMBULATORY_CARE_PROVIDER_SITE_OTHER): Payer: Medicare Other | Admitting: Family

## 2017-02-16 ENCOUNTER — Ambulatory Visit (HOSPITAL_BASED_OUTPATIENT_CLINIC_OR_DEPARTMENT_OTHER)
Admission: RE | Admit: 2017-02-16 | Discharge: 2017-02-16 | Disposition: A | Discharge status: Home / Self Care | Payer: Medicare Other | Source: Ambulatory Visit | Attending: Family | Admitting: Family

## 2017-02-16 ENCOUNTER — Encounter: Payer: Self-pay | Admitting: Family

## 2017-02-16 VITALS — BP 129/94 | HR 70 | Temp 98.2°F | Ht 64.0 in | Wt 133.0 lb

## 2017-02-16 DIAGNOSIS — M25511 Pain in right shoulder: Secondary | ICD-10-CM | POA: Insufficient documentation

## 2017-02-16 DIAGNOSIS — R220 Localized swelling, mass and lump, head: Secondary | ICD-10-CM | POA: Diagnosis not present

## 2017-02-16 DIAGNOSIS — M19011 Primary osteoarthritis, right shoulder: Secondary | ICD-10-CM | POA: Insufficient documentation

## 2017-02-16 DIAGNOSIS — S0993XA Unspecified injury of face, initial encounter: Secondary | ICD-10-CM | POA: Diagnosis not present

## 2017-02-16 DIAGNOSIS — R8290 Unspecified abnormal findings in urine: Secondary | ICD-10-CM

## 2017-02-16 DIAGNOSIS — S99911A Unspecified injury of right ankle, initial encounter: Secondary | ICD-10-CM | POA: Diagnosis not present

## 2017-02-16 DIAGNOSIS — M25571 Pain in right ankle and joints of right foot: Secondary | ICD-10-CM | POA: Diagnosis not present

## 2017-02-16 LAB — POC URINALSYSI DIPSTICK (AUTOMATED)
Bilirubin, UA: NEGATIVE
Blood, UA: NEGATIVE
Glucose, UA: NEGATIVE
KETONES UA: NEGATIVE
LEUKOCYTES UA: NEGATIVE
NITRITE UA: NEGATIVE
PROTEIN UA: NEGATIVE
Spec Grav, UA: 1.015 (ref 1.010–1.025)
Urobilinogen, UA: 0.2 E.U./dL
pH, UA: 6 (ref 5.0–8.0)

## 2017-02-16 NOTE — Telephone Encounter (Signed)
Also- no obvious glass noted surrounding her right eye on x ray.
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
## 2017-02-17 ENCOUNTER — Other Ambulatory Visit: Payer: Self-pay | Admitting: Family

## 2017-02-17 NOTE — Telephone Encounter (Signed)
Spoke with pt and advised her of normal shoulder and eye xrays. She states she continues to have severe shoulder / arm pain and has difficulty raising her arm. Pt is asking for a muscle relaxer to see if that will help. Also states she will be travelling to [place] on 02/25/17 and has a a pacemaker. Pt wants to know if she should see cardiology to make sure it is okay to travel?

## 2017-02-17 NOTE — Telephone Encounter (Signed)
Called her back and LMOM as there was no answer Will refer to sports med to look at her shoulder I would not generally recommend a muscle relaxer for her given her age Please give her cardiologist a call (per Gilmore Laroche it seems she wonders if she is due for a pacemaker check)- they will be able to advise her when this is due

## 2017-02-18 NOTE — Telephone Encounter (Signed)
OK to call in 30 tabs zero refills.

## 2017-02-19 LAB — URINE CULTURE

## 2017-02-19 NOTE — Telephone Encounter (Signed)
Telephone in the refill per PCP ok. Alprazolam #30 to [facility] Patient informed refill done.

## 2017-02-21 ENCOUNTER — Other Ambulatory Visit: Payer: Self-pay | Admitting: Family

## 2017-02-21 MED ORDER — CIPROFLOXACIN HCL 250 MG PO TABS: 250.0000 mg | ORAL_TABLET | Freq: Two times a day (BID) | ORAL | 0 refills | Status: DC

## 2017-02-24 ENCOUNTER — Encounter: Payer: Self-pay | Admitting: Family Medicine

## 2017-02-24 ENCOUNTER — Ambulatory Visit (INDEPENDENT_AMBULATORY_CARE_PROVIDER_SITE_OTHER): Payer: Medicare Other | Admitting: Family Medicine

## 2017-02-24 DIAGNOSIS — S4991XA Unspecified injury of right shoulder and upper arm, initial encounter: Secondary | ICD-10-CM | POA: Diagnosis not present

## 2017-02-24 NOTE — Patient Instructions (Addendum)
You have a full thickness rotator cuff tear of your supraspinatus muscle, partial tear of your subscapularis muscle on ultrasound. Tylenol 500mg  1-2 tabs three times a day as needed for pain. Icing 15 minutes at a time 3-4 times a day. Consider physical therapy to try to regain as much of your motion and strength back as possible. We discussed the pros and cons of surgery for this - if you were interested in trying to have a repair we could refer you to orthopedic surgeon - just let me know which way you'd like to go (therapy or see the surgeon). If you choose the therapy route I would like to see you back in about 5-6 weeks.

## 2017-02-25 DIAGNOSIS — S4991XA Unspecified injury of right shoulder and upper arm, initial encounter: Secondary | ICD-10-CM | POA: Insufficient documentation
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
## 2017-02-25 NOTE — Assessment & Plan Note (Signed)
independently reviewed radiographs.  Performed and reviewed ultrasound - full thickness retracted supraspinatus tear and partial thickness subscapularis tears visualized.  Surprisingly infraspinatus appears intact though it is thin.  Icing, tylenol.  We discussed pros and cons of rotator cuff repair - risks of surgery and possibility this couldn't be repaired though without surgery it's highly likely she would have lack of motion and strength long term.  She will consider whether she would like to pursue surgery vs physical therapy and let us know.  F/u in 5-6 weeks otherwise.

## 2017-03-10 DIAGNOSIS — M791 Myalgia: Secondary | ICD-10-CM | POA: Diagnosis not present

## 2017-03-10 DIAGNOSIS — G894 Chronic pain syndrome: Secondary | ICD-10-CM | POA: Diagnosis not present

## 2017-03-10 DIAGNOSIS — M4125 Other idiopathic scoliosis, thoracolumbar region: Secondary | ICD-10-CM | POA: Diagnosis not present

## 2017-03-16 ENCOUNTER — Ambulatory Visit (INDEPENDENT_AMBULATORY_CARE_PROVIDER_SITE_OTHER): Payer: Medicare Other | Admitting: *Deleted

## 2017-03-16 ENCOUNTER — Telehealth: Payer: Self-pay | Admitting: Cardiology

## 2017-03-16 DIAGNOSIS — I495 Sick sinus syndrome: Secondary | ICD-10-CM | POA: Diagnosis not present

## 2017-03-16 NOTE — Telephone Encounter (Signed)
Spoke with pt and reminded pt of remote transmission that is due today. Pt verbalized understanding.   

## 2017-03-16 NOTE — Progress Notes (Signed)
Remote pacemaker transmission.   

## 2017-03-17 LAB — CUP PACEART REMOTE DEVICE CHECK
Implantable Lead Implant Date: 20130319
Implantable Lead Location: 753860
Lead Channel Impedance Value: 424 Ohm
Lead Channel Sensing Intrinsic Amplitude: 13.645 mV
Lead Channel Sensing Intrinsic Amplitude: 2.04 mV
Lead Channel Setting Pacing Amplitude: 2 V
Lead Channel Setting Sensing Sensitivity: 0.9 mV
MDC IDC LEAD IMPLANT DT: 20130319
MDC IDC LEAD LOCATION: 753859
MDC IDC MSMT BATTERY VOLTAGE: 2.97 V
MDC IDC MSMT LEADCHNL RV IMPEDANCE VALUE: 488 Ohm
MDC IDC PG IMPLANT DT: 20130319
MDC IDC SESS DTM: 20180918170527
MDC IDC STAT BRADY RA PERCENT PACED: 86.68 %

## 2017-03-18 ENCOUNTER — Encounter: Payer: Self-pay | Admitting: Cardiology

## 2017-03-24 ENCOUNTER — Encounter: Payer: Medicare Other | Admitting: Cardiovascular Disease

## 2017-04-02 ENCOUNTER — Other Ambulatory Visit: Payer: Self-pay | Admitting: Family

## 2017-04-02 NOTE — Telephone Encounter (Signed)
Last alprazolam RX: 02/19/17, #30 Last OV: 02/16/17 Next OV: due 05/19/17 UDS: 09/15/16 moderate.  Rx called to pharmacy voicemail.

## 2017-04-04 NOTE — Telephone Encounter (Signed)
Noted  

## 2017-04-07 ENCOUNTER — Ambulatory Visit (INDEPENDENT_AMBULATORY_CARE_PROVIDER_SITE_OTHER): Payer: Medicare Other | Admitting: Cardiovascular Disease

## 2017-04-07 VITALS — BP 110/80 | HR 67 | Ht 64.0 in | Wt 130.4 lb

## 2017-04-07 DIAGNOSIS — I495 Sick sinus syndrome: Secondary | ICD-10-CM

## 2017-04-07 DIAGNOSIS — I5032 Chronic diastolic (congestive) heart failure: Secondary | ICD-10-CM

## 2017-04-07 DIAGNOSIS — R55 Syncope and collapse: Secondary | ICD-10-CM

## 2017-04-07 DIAGNOSIS — R238 Other skin changes: Secondary | ICD-10-CM

## 2017-04-07 DIAGNOSIS — Z950 Presence of cardiac pacemaker: Secondary | ICD-10-CM | POA: Diagnosis not present

## 2017-04-07 DIAGNOSIS — R233 Spontaneous ecchymoses: Secondary | ICD-10-CM

## 2017-04-07 DIAGNOSIS — Z79899 Other long term (current) drug therapy: Secondary | ICD-10-CM
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
## 2017-04-07 NOTE — Patient Instructions (Signed)
Dr Sallyanne Kuster recommends that you continue on your current medications as directed. Please refer to the Current Medication list given to you today.  Your physician recommends that you return for lab work TODAY.  Remote monitoring is used to monitor your Pacemaker or ICD from home. This monitoring reduces the number of office visits required to check your device to one time per year. It allows [place] to keep an eye on the functioning of your device to ensure it is working properly. You are scheduled for a device check from home on Tuesday, December 18th, 2018. You may send your transmission at any time that day. If you have a wireless device, the transmission will be sent automatically. After your physician reviews your transmission, you will receive a notification with your next transmission date.  Dr Sallyanne Kuster recommends that you schedule a follow-up appointment in 12 months with a pacemaker check. You will receive a reminder letter in the mail two months in advance. If you don't receive a letter, please call our office to schedule the follow-up appointment.  If you need a refill on your cardiac medications before your next appointment, please call your pharmacy.

## 2017-04-08 LAB — BASIC METABOLIC PANEL
BUN/Creatinine Ratio: 12 (ref 12–28)
BUN: 9 mg/dL (ref 8–27)
CALCIUM: 9.4 mg/dL (ref 8.7–10.3)
CO2: 24 mmol/L (ref 20–29)
CREATININE: 0.73 mg/dL (ref 0.57–1.00)
Chloride: 103 mmol/L (ref 96–106)
GFR, EST AFRICAN AMERICAN: 90 mL/min/{1.73_m2} (ref >59)
GFR, EST NON AFRICAN AMERICAN: 78 mL/min/{1.73_m2} (ref >59)
Glucose: 84 mg/dL (ref 65–99)
POTASSIUM: 4.4 mmol/L (ref 3.5–5.2)
Sodium: 144 mmol/L (ref 134–144)

## 2017-04-08 LAB — CBC
HEMATOCRIT: 46.3 % (ref 34.0–46.6)
HEMOGLOBIN: 15.7 g/dL (ref 11.1–15.9)
MCH: 32 pg (ref 26.6–33.0)
MCHC: 33.9 g/dL (ref 31.5–35.7)
MCV: 94 fL (ref 79–97)
Platelets: 239 10*3/uL (ref 150–379)
RBC: 4.91 x10E6/uL (ref 3.77–5.28)
RDW: 14.2 % (ref 12.3–15.4)
WBC: 7.8 10*3/uL (ref 3.4–10.8)

## 2017-04-08 LAB — MAGNESIUM: Magnesium: 2.3 mg/dL (ref 1.6–2.3)

## 2017-04-09 ENCOUNTER — Ambulatory Visit: Payer: Medicare Other | Admitting: Medical

## 2017-04-09 ENCOUNTER — Emergency Department (HOSPITAL_BASED_OUTPATIENT_CLINIC_OR_DEPARTMENT_OTHER): Payer: Medicare Other

## 2017-04-09 ENCOUNTER — Encounter: Payer: Self-pay | Admitting: Cardiovascular Disease

## 2017-04-09 ENCOUNTER — Other Ambulatory Visit: Payer: Self-pay

## 2017-04-09 ENCOUNTER — Encounter (HOSPITAL_BASED_OUTPATIENT_CLINIC_OR_DEPARTMENT_OTHER): Payer: Self-pay

## 2017-04-09 ENCOUNTER — Telehealth: Payer: Self-pay | Admitting: Medical

## 2017-04-09 ENCOUNTER — Emergency Department (HOSPITAL_BASED_OUTPATIENT_CLINIC_OR_DEPARTMENT_OTHER)
Admission: EM | Admit: 2017-04-09 | Discharge: 2017-04-09 | Disposition: A | Discharge status: Home / Self Care | Payer: Medicare Other | Attending: Emergency Medicine | Admitting: Emergency Medicine

## 2017-04-09 DIAGNOSIS — R05 Cough: Secondary | ICD-10-CM | POA: Diagnosis not present

## 2017-04-09 DIAGNOSIS — R55 Syncope and collapse: Secondary | ICD-10-CM | POA: Insufficient documentation

## 2017-04-09 DIAGNOSIS — Z5321 Procedure and treatment not carried out due to patient leaving prior to being seen by health care provider: Secondary | ICD-10-CM | POA: Insufficient documentation

## 2017-04-09 NOTE — Telephone Encounter (Signed)
Pt arrived late for appt. Offered pt another appt same day also advised per Mackie Pai pt can go to ED given her symptoms/ reason for her visit today. Pt agreed she needs seen ASAP and chose to go straight to ED at [facility].

## 2017-04-09 NOTE — ED Triage Notes (Signed)
C/o prod cough x 4 weeks-NAD-steady gait

## 2017-04-09 NOTE — ED Notes (Signed)
Pt not in ED WR when called for tx area

## 2017-04-09 NOTE — ED Notes (Signed)
Pt refused wheelchair. denys dizziness at the moment.

## 2017-04-13 ENCOUNTER — Telehealth: Payer: Self-pay | Admitting: Family

## 2017-04-13 NOTE — Telephone Encounter (Signed)
Notified pt and she states symptoms have been occurring x 5 weeks. Reports some days she is feeling better then some has coughing. Scheduled appt for tomorrow with PCP at 11:45am.

## 2017-04-13 NOTE — Telephone Encounter (Signed)
Bernadine Melecio Self [phone]  X Ray results  Maleiya came in late for appointment on the 12th, advised to go to ED, where they did an XRAY, but she had to leave due to emergency with her daughter. She would like someone to call her with the results. Please call and advise.

## 2017-04-13 NOTE — Telephone Encounter (Signed)
CXR was negative for PNA.  Should follow up in office if not improved.

## 2017-04-14 ENCOUNTER — Encounter: Payer: Self-pay | Admitting: Family

## 2017-04-14 ENCOUNTER — Ambulatory Visit (INDEPENDENT_AMBULATORY_CARE_PROVIDER_SITE_OTHER): Payer: Medicare Other | Admitting: Family

## 2017-04-14 VITALS — BP 126/76 | HR 70 | Temp 98.1°F | Resp 16 | Ht 64.0 in | Wt 132.4 lb

## 2017-04-14 DIAGNOSIS — N3000 Acute cystitis without hematuria: Secondary | ICD-10-CM

## 2017-04-14 DIAGNOSIS — R509 Fever, unspecified: Secondary | ICD-10-CM | POA: Diagnosis not present

## 2017-04-14 DIAGNOSIS — N390 Urinary tract infection, site not specified: Secondary | ICD-10-CM | POA: Diagnosis not present

## 2017-04-14 DIAGNOSIS — R5383 Other fatigue: Secondary | ICD-10-CM | POA: Diagnosis not present

## 2017-04-14 LAB — CBC WITH DIFFERENTIAL/PLATELET
BASOS PCT: 1 % (ref 0.0–3.0)
Basophils Absolute: 0.1 10*3/uL (ref 0.0–0.1)
EOS PCT: 1.5 % (ref 0.0–5.0)
Eosinophils Absolute: 0.1 10*3/uL (ref 0.0–0.7)
HCT: 44.5 % (ref 36.0–46.0)
Hemoglobin: 14.7 g/dL (ref 12.0–15.0)
LYMPHS ABS: 2.1 10*3/uL (ref 0.7–4.0)
Lymphocytes Relative: 28.5 % (ref 12.0–46.0)
MCHC: 33.1 g/dL (ref 30.0–36.0)
MCV: 97.4 fl (ref 78.0–100.0)
MONO ABS: 0.5 10*3/uL (ref 0.1–1.0)
MONOS PCT: 6.9 % (ref 3.0–12.0)
NEUTROS ABS: 4.6 10*3/uL (ref 1.4–7.7)
NEUTROS PCT: 62.1 % (ref 43.0–77.0)
Platelets: 252 10*3/uL (ref 150.0–400.0)
RBC: 4.57 Mil/uL (ref 3.87–5.11)
RDW: 14 % (ref 11.5–15.5)
WBC: 7.4 10*3/uL (ref 4.0–10.5)

## 2017-04-14 LAB — URINALYSIS, ROUTINE W REFLEX MICROSCOPIC
Bilirubin Urine: NEGATIVE
Hgb urine dipstick: NEGATIVE
Ketones, ur: NEGATIVE
NITRITE: NEGATIVE
RBC / HPF: NONE SEEN (ref 0–?)
SPECIFIC GRAVITY, URINE: 1.01 (ref 1.000–1.030)
TOTAL PROTEIN, URINE-UPE24: NEGATIVE
URINE GLUCOSE: NEGATIVE
Urobilinogen, UA: 0.2 (ref 0.0–1.0)
pH: 7.5 (ref 5.0–8.0)

## 2017-04-14 LAB — HEPATIC FUNCTION PANEL
ALK PHOS: 58 U/L (ref 39–117)
ALT: 17 U/L (ref 0–35)
AST: 25 U/L (ref 0–37)
Albumin: 4.2 g/dL (ref 3.5–5.2)
BILIRUBIN DIRECT: 0 mg/dL (ref 0.0–0.3)
BILIRUBIN TOTAL: 0.4 mg/dL (ref 0.2–1.2)
Total Protein: 7.4 g/dL (ref 6.0–8.3)

## 2017-04-14 LAB — TSH: TSH: 0.63 u[IU]/mL (ref 0.35–4.50)

## 2017-04-14 NOTE — Progress Notes (Signed)
Subjective:    Patient ID: [name], female    DOB: 1935-06-19, 82 y.o.   MRN: [id number]  HPI  82 yr old female with chief complaint of cough. Reports off/on symptoms x 5 weeks.  Has had intermittent fever.  Had fever yesterday (low grade) 100 after tylenol. She reports that her cough is productive. She reports + fatigue.  Denies nausea/vomiting, diarrhea. Denies headache. Notes that she bruises easily even off of the aspirin x 4-5 months.     Review of Systems    see HPI  Past Medical History:  Diagnosis Date  . Acute bronchitis 10/09/2012  . Anemia    pt denies  . Arthritis   . Atrial fibrillation ([place])   . Cataract    bil  . Cystocele    history with rectocele, pt denies  . Dysrhythmia   . GERD (gastroesophageal reflux disease)   . H/O hiatal hernia   . Hemorrhoid   . History of pancreatitis    pt denies  . Hypothyroidism   . Non Hodgkin's lymphoma ([place])   . Osteoporosis   . Pacemaker 09/15/2011   Medtronic Revo  . Pericarditis   . Pleural effusion, bilateral    history of  . Pulmonary nodules    history of  . Scoliosis   . Shortness of breath    with exertion  . Varicose vein      Social History   Social History  . Marital status: Widowed    Spouse name: N/A  . Number of children: 3  . Years of education: N/A   Occupational History  . self employed    Social History Main Topics  . Smoking status: Never Smoker  . Smokeless tobacco: Never Used  . Alcohol use Yes     Comment: 1 glass of wine occasionally  . Drug use: No  . Sexual activity: Not on file   Other Topics Concern  . 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  She lives alone in a 3-story home.  There is a lift chair which she does not use (placed for her husband).    Past Surgical History:  Procedure Laterality Date  . ABDOMINAL HYSTERECTOMY  04/29/04   partial  . BREAST SURGERY   07/30/01   biopsy x 3, bilateral  . COLONOSCOPY    . EYE SURGERY     cataracts  . MASS EXCISION Right 10/28/2012   Procedure: Removal of mass on neck       ;  Surgeon: Adin Hector, MD;  Location: [facility];  Service: General;  Laterality: Right;  . PACEMAKER INSERTION  09/15/11   MDT  . PERMANENT PACEMAKER INSERTION N/A 09/15/2011   Procedure: PERMANENT PACEMAKER INSERTION;  Surgeon: Sanda Klein, MD;  Location: [facility] CATH [facility];  Service: Cardiovascular;  Laterality: N/A;  . TONSILLECTOMY      Family History  Problem Relation Age of Onset  . Stroke Mother   . Stroke Father   . Heart disease Daughter        congenital "whole in her heart"  . Lymphoma Daughter        nonhodgkins  . Breast cancer Sister   . Heart disease Brother     Allergies  Allergen Reactions  . Amoxicillin Hives    Hives  . Buprenorphine Hives  . Codeine Hives    REACTION: "makes her crazy" Hives  .  Demerol Hives  . Morphine And Related Hives  . Sulfonamide Derivatives Rash    REACTION: Rash  . Vicodin [Hydrocodone-Acetaminophen] Hives    Current Outpatient Prescriptions on File Prior to Visit  Medication Sig Dispense Refill  . acetaminophen (TYLENOL) 500 MG tablet Take 500 mg by mouth every 6 (six) hours as needed for pain.    [name] ALPRAZolam (XANAX) 0.5 MG tablet take 1 tablet by mouth at bedtime if needed for sleep 30 tablet 0  . Calcium Carbonate-Vitamin D (CALTRATE 600+D) 600-400 MG-UNIT per tablet Take 1 tablet by mouth 2 (two) times daily.    [name] denosumab (PROLIA) 60 MG/ML SOLN injection Inject 60 mg into the skin every 6 (six) months. Reported on 10/16/2015    . diltiazem (CARDIZEM CD) 180 MG 24 hr capsule Take 1 capsule (180 mg total) by mouth daily. 30 capsule 11  . furosemide (LASIX) 20 MG tablet Take 1 tablet (20 mg total) by mouth daily. 90 tablet 3  . gabapentin (NEURONTIN) 300 MG capsule Take 1 capsule (300 mg total) by mouth 3 (three) times daily. 90 capsule 3  . HIZENTRA 4 GM/20ML SOLN Every 4  weeks    . ibuprofen (ADVIL,MOTRIN) 400 MG tablet Take 1 tablet (400 mg total) by mouth every 8 (eight) hours as needed. 30 tablet 0  . KRILL OIL OMEGA-3 PO Take 1 capsule by mouth daily.    [name] levothyroxine (SYNTHROID, LEVOTHROID) 100 MCG tablet take 1 tablet by mouth once daily 90 tablet 0  . Magnesium Oxide 400 (240 Mg) MG TABS Take 1 tablet (400 mg total) by mouth daily. 30 tablet   . omeprazole (PRILOSEC) 40 MG capsule take 1 capsule by mouth once daily 30 capsule 5  . potassium chloride (K-DUR) 10 MEQ tablet Take 1 tablet (10 mEq total) by mouth daily. 90 tablet 3  . traMADol (ULTRAM) 50 MG tablet Take 1 tablet by mouth daily as needed.     No current facility-administered medications on file prior to visit.     BP 126/76 (BP Location: Right Arm, Cuff Size: Normal)   Pulse 70   Temp 98.1 F (36.7 C) (Oral)   Resp 16   Ht 5\' 4"  (1.626 m)   Wt 132 lb 6.4 oz (60.1 kg)   SpO2 100%   BMI 22.73 kg/m    Objective:   Physical Exam  Constitutional: She appears well-developed and well-nourished.  HENT:  Head: Normocephalic and atraumatic.  Right Ear: Tympanic membrane and ear canal normal.  Left Ear: Tympanic membrane and ear canal normal.  Mouth/Throat: No oropharyngeal exudate, posterior oropharyngeal edema or posterior oropharyngeal erythema.  Cardiovascular: Normal rate, regular rhythm and normal heart sounds.   No murmur heard. Pulmonary/Chest: Effort normal and breath sounds normal. No respiratory distress. She has no wheezes.  Psychiatric: She has a normal mood and affect. Her behavior is normal. Judgment and thought content normal.          Assessment & Plan:  UTI-urinalysis is performed and is suggestive of urinary tract infection. Will begin empiric Cipro and send urine culture. See phone note.  Fatigue-fatigue and low-grade fever could be related to urinary tract infection. She did have a recent chest x-ray which was clear her lung exam is clear today. Thyroid  function, blood count and liver function are normal. Recent basic metabolic panel was within normal limits. If fatigue does not improve after treatment of UTI consider further evaluation.

## 2017-04-14 NOTE — Patient Instructions (Addendum)
Please complete lab work prior to leaving.  Call if new/worsening symptoms.

## 2017-04-15 ENCOUNTER — Telehealth: Payer: Self-pay | Admitting: Family

## 2017-04-15 LAB — URINE CULTURE
MICRO NUMBER: 81159367
Result:: NO GROWTH
SPECIMEN QUALITY: ADEQUATE

## 2017-04-15 MED ORDER — CIPROFLOXACIN HCL 250 MG PO TABS: 250.0000 mg | ORAL_TABLET | Freq: Two times a day (BID) | ORAL | 0 refills | Status: DC

## 2017-04-15 NOTE — Telephone Encounter (Signed)
Spoke w/ Pt, informed her of results and recommendations. Pt verbalized understanding.

## 2017-04-15 NOTE — Telephone Encounter (Signed)
Please contact patient and let her know that urinalysis suggests urinary tract infection. I would like for her to start Cipro. Thyroid function, blood count, and liver function testing looks good.

## 2017-04-20 ENCOUNTER — Telehealth: Payer: Self-pay | Admitting: *Deleted

## 2017-04-20 DIAGNOSIS — R059 Cough, unspecified: Secondary | ICD-10-CM

## 2017-04-20 DIAGNOSIS — R05 Cough: Secondary | ICD-10-CM

## 2017-04-20 LAB — CUP PACEART INCLINIC DEVICE CHECK
Battery Voltage: 2.97 V
Brady Statistic RA Percent Paced: 66.1 %
Implantable Lead Implant Date: 20130319
Implantable Lead Implant Date: 20130319
Implantable Lead Location: 753859
Lead Channel Impedance Value: 400 Ohm
Lead Channel Pacing Threshold Amplitude: 1 V
Lead Channel Pacing Threshold Pulse Width: 0.4 ms
Lead Channel Setting Pacing Amplitude: 2 V
Lead Channel Setting Sensing Sensitivity: 0.9 mV
MDC IDC LEAD LOCATION: 753860
MDC IDC MSMT LEADCHNL RV IMPEDANCE VALUE: 504 Ohm
MDC IDC MSMT LEADCHNL RV PACING THRESHOLD AMPLITUDE: 1 V
MDC IDC MSMT LEADCHNL RV PACING THRESHOLD PULSEWIDTH: 0.4 ms
MDC IDC MSMT LEADCHNL RV SENSING INTR AMPL: 17.397 mV
MDC IDC PG IMPLANT DT: 20130319
MDC IDC SESS DTM: 20181010144102

## 2017-04-20 NOTE — Telephone Encounter (Signed)
Notified pt. She states she continues to cough up a lot of yellow phlegm, continues to have fatigue and intermittent chills. Has been taking something otc to try and dry up the mucus but states it makes her sleepy.  Please advise?

## 2017-04-20 NOTE — Telephone Encounter (Signed)
-----   Message from [name], NP sent at 04/18/2017  8:36 AM EDT ----- Urine culture negative for infection. How is she feeling?

## 2017-04-21 ENCOUNTER — Ambulatory Visit (HOSPITAL_BASED_OUTPATIENT_CLINIC_OR_DEPARTMENT_OTHER)
Admission: RE | Admit: 2017-04-21 | Discharge: 2017-04-21 | Disposition: A | Discharge status: Home / Self Care | Payer: Medicare Other | Source: Ambulatory Visit | Attending: Family | Admitting: Family

## 2017-04-21 DIAGNOSIS — R05 Cough: Secondary | ICD-10-CM | POA: Insufficient documentation

## 2017-04-21 DIAGNOSIS — R059 Cough, unspecified: Secondary | ICD-10-CM

## 2017-04-21 DIAGNOSIS — K449 Diaphragmatic hernia without obstruction or gangrene: Secondary | ICD-10-CM | POA: Insufficient documentation

## 2017-04-21 NOTE — Telephone Encounter (Signed)
I would like her to complete a cxr today please.  I am not sure what she is taking otc but she can try mucinex which should not make her drowsy.

## 2017-04-21 NOTE — Telephone Encounter (Signed)
Notified pt and she voices understanding. Will complete this evening. States she is already taking mucinex twice a day. Please advise when result is available?

## 2017-04-22 MED ORDER — LORATADINE 10 MG PO TABS: 10.0000 mg | ORAL_TABLET | Freq: Every day | ORAL | 11 refills | Status: DC

## 2017-04-22 NOTE — Telephone Encounter (Signed)
Please let patient know that chest x ray is negative for pneumonia.  She can add claritin once daily.  Come back in for re-evaluation if symptoms do not improve with addition of claritin.

## 2017-04-23 DIAGNOSIS — M4126 Other idiopathic scoliosis, lumbar region: Secondary | ICD-10-CM | POA: Diagnosis not present

## 2017-04-23 DIAGNOSIS — M791 Myalgia, unspecified site: Secondary | ICD-10-CM | POA: Diagnosis not present

## 2017-04-23 DIAGNOSIS — G894 Chronic pain syndrome: Secondary | ICD-10-CM | POA: Diagnosis not present

## 2017-04-23 NOTE — Telephone Encounter (Signed)
Called patient with XR results. Advised to start Claritin daily and call back if no relief. Patient states she has been taking Benadryl but will try claritin and call back on Monday if no changes.

## 2017-04-23 NOTE — Telephone Encounter (Signed)
Noted  

## 2017-04-26 ENCOUNTER — Other Ambulatory Visit: Payer: Self-pay | Admitting: Cardiovascular Disease

## 2017-05-11 ENCOUNTER — Encounter: Payer: Self-pay | Admitting: Family

## 2017-05-11 ENCOUNTER — Ambulatory Visit: Payer: Medicare Other | Admitting: Family

## 2017-05-11 VITALS — BP 112/75 | HR 70 | Temp 97.9°F | Ht 64.0 in | Wt 131.0 lb

## 2017-05-11 DIAGNOSIS — J40 Bronchitis, not specified as acute or chronic: Secondary | ICD-10-CM

## 2017-05-11 DIAGNOSIS — F419 Anxiety disorder, unspecified: Secondary | ICD-10-CM

## 2017-05-11 DIAGNOSIS — Z79899 Other long term (current) drug therapy: Secondary | ICD-10-CM | POA: Diagnosis not present

## 2017-05-11 MED ORDER — AZITHROMYCIN 250 MG PO TABS: ORAL_TABLET | ORAL | 0 refills | Status: DC

## 2017-05-11 NOTE — Progress Notes (Signed)
Subjective:    Patient ID: [name], female    DOB: Sep 11, 1934, 82 y.o.   MRN: [id number]  HPI  [name] is an 82 yr old female who presents today with chief complaint of cough. Cough has been present for approximately 2 months.  She had same complaint during her visit on 10/17. She also had a uti that day and was started on cipro. Today she denies urinary symptoms or fever. She reports that her fatigue is unchanged.    She has tried claritin without improvement. Cough is usually dry but can be productive at times.  Using mucinex which has helped some. She report some mild nasal drainage which is improved with nasonex.  If she does not take mucinex "I am so stopped up."  She does report some heartburn. Cough is worse when laying flat. She continues omeprazole.   Review of Systems See HPI  Past Medical History:  Diagnosis Date  . Acute bronchitis 10/09/2012  . Anemia    pt denies  . Arthritis   . Atrial fibrillation ([place])   . Cataract    bil  . Cystocele    history with rectocele, pt denies  . Dysrhythmia   . GERD (gastroesophageal reflux disease)   . H/O hiatal hernia   . Hemorrhoid   . History of pancreatitis    pt denies  . Hypothyroidism   . Non Hodgkin's lymphoma ([place])   . Osteoporosis   . Pacemaker 09/15/2011   Medtronic Revo  . Pericarditis   . Pleural effusion, bilateral    history of  . Pulmonary nodules    history of  . Scoliosis   . Shortness of breath    with exertion  . Varicose vein      Social History   Socioeconomic History  . Marital status: Widowed    Spouse name: Not on file  . Number of children: 3  . Years of education: Not on file  . Highest education level: Not on file  Social Needs  . Financial resource strain: Not on file  . Food insecurity - worry: Not on file  . Food insecurity - inability: Not on file  . Transportation needs - medical: Not on file  . Transportation needs - non-medical: Not on file  Occupational History   . Occupation: self employed  Tobacco Use  . Smoking status: Never Smoker  . Smokeless tobacco: Never Used  Substance and Sexual Activity  . Alcohol use: Yes    Comment: 1 glass of wine occasionally  . Drug use: No  . Sexual activity: Not on file  Other Topics Concern  . 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  She lives alone in a 3-story home.  There is a lift chair which she does not use (placed for her husband).    Past Surgical History:  Procedure Laterality Date  . ABDOMINAL HYSTERECTOMY  04/29/04   partial  . BREAST SURGERY  07/30/01   biopsy x 3, bilateral  . COLONOSCOPY    . EYE SURGERY     cataracts  . PACEMAKER INSERTION  09/15/11   MDT  . TONSILLECTOMY      Family History  Problem Relation Age of Onset  . Stroke Mother   . Stroke Father   . Heart disease Daughter        congenital "whole in her heart"  . Lymphoma Daughter  nonhodgkins  . Breast cancer Sister   . Heart disease Brother     Allergies  Allergen Reactions  . Amoxicillin Hives    Hives  . Buprenorphine Hives  . Codeine Hives    REACTION: "makes her crazy" Hives  . Demerol Hives  . Morphine And Related Hives  . Sulfonamide Derivatives Rash    REACTION: Rash  . Vicodin [Hydrocodone-Acetaminophen] Hives    Current Outpatient Medications on File Prior to Visit  Medication Sig Dispense Refill  . acetaminophen (TYLENOL) 500 MG tablet Take 500 mg by mouth every 6 (six) hours as needed for pain.    [name] ALPRAZolam (XANAX) 0.5 MG tablet take 1 tablet by mouth at bedtime if needed for sleep 30 tablet 0  . Calcium Carbonate-Vitamin D (CALTRATE 600+D) 600-400 MG-UNIT per tablet Take 1 tablet by mouth 2 (two) times daily.    [name] denosumab (PROLIA) 60 MG/ML SOLN injection Inject 60 mg into the skin every 6 (six) months. Reported on 10/16/2015    . diltiazem (CARDIZEM CD) 180 MG 24 hr capsule Take 1 capsule  (180 mg total) by mouth daily. 30 capsule 11  . furosemide (LASIX) 20 MG tablet Take 1 tablet (20 mg total) by mouth daily. 90 tablet 3  . gabapentin (NEURONTIN) 300 MG capsule Take 1 capsule (300 mg total) by mouth 3 (three) times daily. 90 capsule 3  . HIZENTRA 4 GM/20ML SOLN Every 4 weeks    . ibuprofen (ADVIL,MOTRIN) 400 MG tablet Take 1 tablet (400 mg total) by mouth every 8 (eight) hours as needed. 30 tablet 0  . KRILL OIL OMEGA-3 PO Take 1 capsule by mouth daily.    [name] levothyroxine (SYNTHROID, LEVOTHROID) 100 MCG tablet take 1 tablet by mouth once daily 90 tablet 0  . loratadine (CLARITIN) 10 MG tablet Take 1 tablet (10 mg total) by mouth daily. 30 tablet 11  . Magnesium Oxide 400 (240 Mg) MG TABS Take 1 tablet (400 mg total) by mouth daily. 30 tablet   . omeprazole (PRILOSEC) 40 MG capsule take 1 capsule by mouth once daily 30 capsule 5  . potassium chloride (K-DUR) 10 MEQ tablet take 1 tablet by mouth once daily 90 tablet 3  . traMADol (ULTRAM) 50 MG tablet Take 1 tablet by mouth daily as needed.     No current facility-administered medications on file prior to visit.     BP 112/75 (BP Location: Right Arm, Cuff Size: Normal)   Pulse 70   Temp 97.9 F (36.6 C) (Oral)   Ht 5\' 4"  (1.626 m)   Wt 131 lb (59.4 kg)   SpO2 98%   BMI 22.49 kg/m       Objective:   Physical Exam  Constitutional: She is oriented to person, place, and time. She appears well-developed and well-nourished.  HENT:  Head: Normocephalic and atraumatic.  Bilateral TM's occluded by cerumen  Eyes: No scleral icterus.  Neck: Neck supple. No thyromegaly present.  Cardiovascular: Normal rate, regular rhythm and normal heart sounds.  No murmur heard. Pulmonary/Chest: Effort normal and breath sounds normal. No respiratory distress. She has no wheezes.  Musculoskeletal: She exhibits no edema.  Lymphadenopathy:    She has no cervical adenopathy.  Neurological: She is alert and oriented to person, place, and  time.  Skin: Skin is warm.  Psychiatric: She has a normal mood and affect. Her behavior is normal. Judgment and thought content normal.          Assessment & Plan:  Bronchitis-  given duration will rx with azithromycin. Sinusitis is a possible contributing factor. If symptoms fail to improve will plan pulmonary referral.   Anxiety- due for follow up UDS due to xanax use.

## 2017-05-11 NOTE — Patient Instructions (Addendum)
Please begin azithromycin. Let me know if your symptoms worsen or if not improved in 1 week.

## 2017-05-14 ENCOUNTER — Other Ambulatory Visit: Payer: Self-pay | Admitting: Cardiovascular Disease

## 2017-05-19 LAB — PAIN MGMT, PROFILE 8 W/CONF, U
6 ACETYLMORPHINE: NEGATIVE ng/mL (ref <10)
ALPHAHYDROXYALPRAZOLAM: 45 ng/mL — AB (ref <25)
AMINOCLONAZEPAM: NEGATIVE ng/mL (ref <25)
Alcohol Metabolites: POSITIVE ng/mL — AB (ref <500)
Alphahydroxymidazolam: NEGATIVE ng/mL (ref <50)
Alphahydroxytriazolam: NEGATIVE ng/mL (ref <50)
Amphetamines: NEGATIVE ng/mL (ref <500)
BENZODIAZEPINES: POSITIVE ng/mL — AB (ref <100)
BUPRENORPHINE, URINE: NEGATIVE ng/mL (ref <5)
Cocaine Metabolite: NEGATIVE ng/mL (ref <150)
Creatinine: 20.8 mg/dL
Ethyl Glucuronide (ETG): 1659 ng/mL — ABNORMAL HIGH (ref <500)
Ethyl Sulfate (ETS): 293 ng/mL — ABNORMAL HIGH (ref <100)
HYDROXYETHYLFLURAZEPAM: NEGATIVE ng/mL (ref <50)
Lorazepam: NEGATIVE ng/mL (ref <50)
MARIJUANA METABOLITE: NEGATIVE ng/mL (ref <20)
MDMA: NEGATIVE ng/mL (ref <500)
NORDIAZEPAM: NEGATIVE ng/mL (ref <50)
Opiates: NEGATIVE ng/mL (ref <100)
Oxazepam: NEGATIVE ng/mL (ref <50)
Oxidant: NEGATIVE ug/mL (ref <200)
Oxycodone: NEGATIVE ng/mL (ref <100)
TEMAZEPAM: NEGATIVE ng/mL (ref <50)
pH: 6.16 (ref 4.5–9.0)

## 2017-05-26 ENCOUNTER — Ambulatory Visit: Payer: Self-pay | Admitting: *Deleted

## 2017-05-26 NOTE — Telephone Encounter (Signed)
Since she has not improved, she needs a follow up OV please prior to additional treatment.

## 2017-05-26 NOTE — Telephone Encounter (Signed)
  Reason for Disposition . Cough has been present for > 3 weeks  Answer Assessment - Initial Assessment Questions 1. ONSET: "When did the cough begin?"      2 weeks ago 2. SEVERITY: "How bad is the cough today?"      Pt states when she feels mucous in her throat she has to cough 3-4/day 3. RESPIRATORY DISTRESS: "Describe your breathing."      Normal 4. FEVER: "Do you have a fever?" If so, ask: "What is your temperature, how was it measured, and when did it start?"     Fever 5. SPUTUM: "Describe the color of your sputum" (clear, white, yellow, green)     Yellow 6. HEMOPTYSIS: "Are you coughing up any blood?" If so ask: "How much?" (flecks, streaks, tablespoons, etc.)     No 7. CARDIAC HISTORY: "Do you have any history of heart disease?" (e.g., heart attack, congestive heart failure)      Pacemaker 8. LUNG HISTORY: "Do you have any history of lung disease?"  (e.g., pulmonary embolus, asthma, emphysema)     No 9. PE RISK FACTORS: "Do you have a history of blood clots?" (or: recent major surgery, recent prolonged travel, bedridden )     No 10. OTHER SYMPTOMS: "Do you have any other symptoms?" (e.g., runny nose, wheezing, chest pain)       No 11. TRAVEL: "Have you traveled out of the country in the last month?" (e.g., travel history, exposures)      No. Went to [place] in September  Protocols used: [place]   Pt states she is having a lot of mucous production that does not seem to be clearing up. Pt was recently prescribed an antibiotic and completed taking it about two weeks ago,but states that she feels like it is not all the way cleared up. Pt offered to make an appt but just wants to make the doctor aware and wants a call back to see if she needs to come in for an appt or if additional medication could be called in. Pt states she has been taking Mucinex and Claritin for tx of current symptoms.

## 2017-05-26 NOTE — Telephone Encounter (Signed)
Notified pt. She is unable to come to office today. Scheduled appt for tomorrow with Dr Nani Ravens at 11:30am and pt is agreeable.

## 2017-05-27 ENCOUNTER — Ambulatory Visit: Payer: Medicare Other | Admitting: Family Medicine

## 2017-05-27 ENCOUNTER — Encounter: Payer: Self-pay | Admitting: Family Medicine

## 2017-05-27 VITALS — BP 128/84 | HR 96 | Temp 98.0°F | Ht 64.0 in | Wt 133.1 lb

## 2017-05-27 DIAGNOSIS — R05 Cough: Secondary | ICD-10-CM | POA: Diagnosis not present

## 2017-05-27 DIAGNOSIS — R053 Chronic cough: Secondary | ICD-10-CM

## 2017-05-27 MED ORDER — FLUTICASONE PROPIONATE 50 MCG/ACT NA SUSP: 2.0000 | Freq: Every day | NASAL | 2 refills | Status: DC

## 2017-05-27 MED ORDER — LEVOCETIRIZINE DIHYDROCHLORIDE 5 MG PO TABS: 5.0000 mg | ORAL_TABLET | Freq: Every evening | ORAL | 2 refills | Status: DC

## 2017-05-27 NOTE — Progress Notes (Signed)
Chief Complaint  Patient presents with  . Cough    congestion    [name] is 82 y.o. and is here for a cough.  Duration: 3 months (Labor Day) Productive? No Associated symptoms: mucous in throat area (unsure if it is dripping down or coming from lungs) Denies: fever, night sweats, nasal congestion, rhinorrhea, sore throat, facial pain, chest pain, dyspnea, wheezing, rigors Hx of GERD? Yes ACEi? No  Zpak, Cipro, Mucinex at home; Zpak helped slightly for a small period of time. Mucinex helpful.  No smoke exposure and she has never smoked. No hx of asthma/COPD. CXR neg 10/12 and 10/24  ROS:  Resp: +Cough Cardio: No chest pain  Past Medical History:  Diagnosis Date  . Anemia    pt denies  . Arthritis   . Atrial fibrillation ([place])   . Cataract    bil  . Cystocele    history with rectocele, pt denies  . Dysrhythmia   . GERD (gastroesophageal reflux disease)   . H/O hiatal hernia   . Hemorrhoid   . History of pancreatitis    pt denies  . Hypothyroidism   . Non Hodgkin's lymphoma ([place])   . Osteoporosis   . Pacemaker 09/15/2011   Medtronic Revo  . Pericarditis   . Pleural effusion, bilateral    history of  . Pulmonary nodules    history of  . Scoliosis   . Varicose vein    Family History  Problem Relation Age of Onset  . Stroke Mother   . Stroke Father   . Heart disease Daughter        congenital "whole in her heart"  . Lymphoma Daughter        nonhodgkins  . Breast cancer Sister   . Heart disease Brother    Allergies as of 05/27/2017      Reactions   Amoxicillin Hives   Hives   Buprenorphine Hives   Codeine Hives   REACTION: "makes her crazy" Hives   Demerol Hives   Morphine And Related Hives   Sulfonamide Derivatives Rash   REACTION: Rash   Vicodin [hydrocodone-acetaminophen] Hives      Medication List        Accurate as of 05/27/17 12:18 PM. Always use your most recent med list.          acetaminophen 500 MG tablet Commonly  known as:  TYLENOL Take 500 mg by mouth every 6 (six) hours as needed for pain.   ALPRAZolam 0.5 MG tablet Commonly known as:  XANAX take 1 tablet by mouth at bedtime if needed for sleep   CALTRATE 600+D 600-400 MG-UNIT tablet Generic drug:  Calcium Carbonate-Vitamin D Take 1 tablet by mouth 2 (two) times daily.   denosumab 60 MG/ML Soln injection Commonly known as:  PROLIA Inject 60 mg into the skin every 6 (six) months. Reported on 10/16/2015   diltiazem 180 MG 24 hr capsule Commonly known as:  CARDIZEM CD Take one (1) capsule (180 mg) by mouth daily.   fluticasone 50 MCG/ACT nasal spray Commonly known as:  FLONASE Place 2 sprays into both nostrils daily.   furosemide 20 MG tablet Commonly known as:  LASIX Take 1 tablet (20 mg total) by mouth daily.   gabapentin 300 MG capsule Commonly known as:  NEURONTIN Take 1 capsule (300 mg total) by mouth 3 (three) times daily.   HIZENTRA 4 GM/20ML Soln Generic drug:  Immune Globulin (Human) Every 4 weeks   ibuprofen 400 MG tablet  Commonly known as:  ADVIL,MOTRIN Take 1 tablet (400 mg total) by mouth every 8 (eight) hours as needed.   KRILL OIL OMEGA-3 PO Take 1 capsule by mouth daily.   levocetirizine 5 MG tablet Commonly known as:  XYZAL Take 1 tablet (5 mg total) by mouth every evening.   levothyroxine 100 MCG tablet Commonly known as:  SYNTHROID, LEVOTHROID take 1 tablet by mouth once daily   Magnesium Oxide 400 (240 Mg) MG Tabs Take 1 tablet (400 mg total) by mouth daily.   omeprazole 40 MG capsule Commonly known as:  PRILOSEC take 1 capsule by mouth once daily   potassium chloride 10 MEQ tablet Commonly known as:  K-DUR take 1 tablet by mouth once daily   traMADol 50 MG tablet Commonly known as:  ULTRAM Take 1 tablet by mouth daily as needed.       BP 128/84 (BP Location: Left Arm, Patient Position: Sitting, Cuff Size: Normal)   Pulse 96   Temp 98 F (36.7 C) (Oral)   Ht 5\' 4"  (1.626 m)   Wt 133 lb  2 oz (60.4 kg)   SpO2 97%   BMI 22.85 kg/m  Gen: Awake, alert, appears stated age HEENT: Ears neg, nares patent without D/C, turbinates unremarkable, Pharynx pink without exudate Neck: Supple, no masses or asymmetry, no tenderness Heart: RRR, no bruits, no LE edema Lungs: CTAB, normal effort, no accessory muscle use Psych: Age appropriate judgement and insight, normal mood and affect  Chronic cough - Plan: levocetirizine (XYZAL) 5 MG tablet, fluticasone (FLONASE) 50 MCG/ACT nasal spray  Orders as above. Tx for upper airway cough syndrome. Does not sound like vocal polyps, reflux, COPD/asthma.  Cont Mucinex. Look out for fevers, SOB, worsening or new symptoms.  F/u in 3 weeks w reg PCP if symptoms fail to improve. The patient voiced understanding and agreement to the plan.  [place], DO 05/27/17 12:18 PM

## 2017-05-27 NOTE — Patient Instructions (Addendum)
Flonase (fluticasone); nasal spray that is over the counter. 2 sprays each nostril, once daily. Aim towards the same side eye when you spray.  There are available OTC, and the generic versions, which may be cheaper, are in parentheses. Show this to a pharmacist if you have trouble finding any of these items.   Things to look out for: fevers, shortness of breath, worsening symptoms.  Let us know if you need anything.

## 2017-05-27 NOTE — Progress Notes (Signed)
Pre visit review using our clinic review tool, if applicable. No additional management support is needed unless otherwise documented below in the visit note. 

## 2017-05-30 ENCOUNTER — Other Ambulatory Visit: Payer: Self-pay | Admitting: Family

## 2017-06-02 ENCOUNTER — Telehealth: Payer: Self-pay | Admitting: Family

## 2017-06-02 NOTE — Telephone Encounter (Signed)
Prolia benefits received PA NOT required 20% co-insurance for Prolia   Patient may owe approximately $40 OOP  Patient due after 06/20/17  Letter mailed to inform patient of benefits and to schedule

## 2017-06-03 ENCOUNTER — Other Ambulatory Visit: Payer: Self-pay | Admitting: Family

## 2017-06-03 DIAGNOSIS — M791 Myalgia, unspecified site: Secondary | ICD-10-CM | POA: Diagnosis not present

## 2017-06-03 DIAGNOSIS — G894 Chronic pain syndrome: Secondary | ICD-10-CM | POA: Diagnosis not present

## 2017-06-03 DIAGNOSIS — M4125 Other idiopathic scoliosis, thoracolumbar region: Secondary | ICD-10-CM | POA: Diagnosis not present

## 2017-06-04 NOTE — Telephone Encounter (Signed)
Looks ok to me. Refill sent.

## 2017-06-04 NOTE — Telephone Encounter (Signed)
Last alprazolam RX: Last OV: 05/11/17 Next OV: none scheduled UDS: 05/11/17 CSC: 08/2016 CS Registry:  Can you look at registry as well? It appears pt is receiving Tramadol from Outside Provider which I believe is consistent but it looks like the pharmacy may have run the alprazolam Rx for May under the wrong Provider?[name]

## 2017-06-14 ENCOUNTER — Telehealth: Payer: Self-pay | Admitting: Cardiovascular Disease

## 2017-06-14 NOTE — Telephone Encounter (Signed)
Please have her cut back her diuretic to 3 days a week only, for example MWF. Please have her keep a log of the time and date of the dizzy spells and then do a pacer download so we can see if the spells are rhythm related in any way. Thanks [employer]

## 2017-06-14 NOTE — Telephone Encounter (Signed)
Spoke with pt, aware of dr croitoru's recommendations.  

## 2017-06-14 NOTE — Telephone Encounter (Signed)
F/u Message ° °Pt returning RN call. Please call back to discuss  °

## 2017-06-14 NOTE — Telephone Encounter (Signed)
Spoke with pt, she reports feeling lightheaded off and on during the day. She reports it can occur at rest or exertion. She has not noticed it when standing from sitting. There is not change when turning head or lying down. She denies any edema and reports her weight is down to 133 lb, previously she was in the 140's. She is currently taking furosemide 20 mg once daily. Encouraged patient to take her bp when she is having the dizziness. Will forward this to dr croitoru to review and advise.

## 2017-06-14 NOTE — Telephone Encounter (Signed)
°  New Prob  States she has been experiencing intermittent dizziness in the morning x 4-5 days. States she feels this may be related to her fluid pill. Requesting to speak to nurse regarding her symptoms and if she could come off of her fluid pill. Please call.

## 2017-06-14 NOTE — Telephone Encounter (Signed)
Left message for pt to call.

## 2017-06-15 ENCOUNTER — Telehealth: Payer: Self-pay | Admitting: Cardiology

## 2017-06-15 ENCOUNTER — Ambulatory Visit (INDEPENDENT_AMBULATORY_CARE_PROVIDER_SITE_OTHER): Payer: Medicare Other | Admitting: *Deleted

## 2017-06-15 DIAGNOSIS — R05 Cough: Secondary | ICD-10-CM | POA: Diagnosis not present

## 2017-06-15 DIAGNOSIS — D839 Common variable immunodeficiency, unspecified: Secondary | ICD-10-CM | POA: Diagnosis not present

## 2017-06-15 DIAGNOSIS — I495 Sick sinus syndrome: Secondary | ICD-10-CM

## 2017-06-15 DIAGNOSIS — R0981 Nasal congestion: Secondary | ICD-10-CM | POA: Diagnosis not present

## 2017-06-15 DIAGNOSIS — Z8572 Personal history of non-Hodgkin lymphomas: Secondary | ICD-10-CM | POA: Diagnosis not present

## 2017-06-15 DIAGNOSIS — J42 Unspecified chronic bronchitis: Secondary | ICD-10-CM | POA: Diagnosis not present

## 2017-06-15 NOTE — Telephone Encounter (Signed)
Spoke with pt and reminded pt of remote transmission that is due today. Pt verbalized understanding.   

## 2017-06-16 ENCOUNTER — Ambulatory Visit (HOSPITAL_COMMUNITY)
Admission: RE | Admit: 2017-06-16 | Discharge: 2017-06-16 | Disposition: A | Discharge status: Home / Self Care | Payer: Medicare Other | Source: Ambulatory Visit | Attending: Internal Medicine | Admitting: Internal Medicine

## 2017-06-16 ENCOUNTER — Other Ambulatory Visit (HOSPITAL_BASED_OUTPATIENT_CLINIC_OR_DEPARTMENT_OTHER): Payer: Medicare Other

## 2017-06-16 ENCOUNTER — Encounter (HOSPITAL_COMMUNITY): Payer: Self-pay

## 2017-06-16 ENCOUNTER — Encounter: Payer: Self-pay | Admitting: Cardiology

## 2017-06-16 DIAGNOSIS — C8221 Follicular lymphoma grade III, unspecified, lymph nodes of head, face, and neck: Secondary | ICD-10-CM

## 2017-06-16 DIAGNOSIS — R918 Other nonspecific abnormal finding of lung field: Secondary | ICD-10-CM | POA: Diagnosis not present

## 2017-06-16 DIAGNOSIS — I700 Atherosclerosis of aorta: Secondary | ICD-10-CM | POA: Diagnosis not present

## 2017-06-16 DIAGNOSIS — Z8572 Personal history of non-Hodgkin lymphomas: Secondary | ICD-10-CM

## 2017-06-16 DIAGNOSIS — C8590 Non-Hodgkin lymphoma, unspecified, unspecified site: Secondary | ICD-10-CM | POA: Diagnosis not present

## 2017-06-16 LAB — COMPREHENSIVE METABOLIC PANEL
ALT: 18 U/L (ref 0–55)
ANION GAP: 9 meq/L (ref 3–11)
AST: 23 U/L (ref 5–34)
Albumin: 3.9 g/dL (ref 3.5–5.0)
Alkaline Phosphatase: 63 U/L (ref 40–150)
BUN: 11.7 mg/dL (ref 7.0–26.0)
CHLORIDE: 105 meq/L (ref 98–109)
CO2: 28 meq/L (ref 22–29)
Calcium: 9.7 mg/dL (ref 8.4–10.4)
Creatinine: 0.8 mg/dL (ref 0.6–1.1)
GLUCOSE: 69 mg/dL — AB (ref 70–140)
POTASSIUM: 3.8 meq/L (ref 3.5–5.1)
SODIUM: 143 meq/L (ref 136–145)
TOTAL PROTEIN: 7.5 g/dL (ref 6.4–8.3)
Total Bilirubin: 0.52 mg/dL (ref 0.20–1.20)

## 2017-06-16 LAB — CBC WITH DIFFERENTIAL/PLATELET
BASO%: 0.9 % (ref 0.0–2.0)
BASOS ABS: 0.1 10*3/uL (ref 0.0–0.1)
EOS ABS: 0.1 10*3/uL (ref 0.0–0.5)
EOS%: 0.6 % (ref 0.0–7.0)
HEMATOCRIT: 46.4 % (ref 34.8–46.6)
HEMOGLOBIN: 15.5 g/dL (ref 11.6–15.9)
LYMPH#: 1.7 10*3/uL (ref 0.9–3.3)
LYMPH%: 22 % (ref 14.0–49.7)
MCH: 32.1 pg (ref 25.1–34.0)
MCHC: 33.4 g/dL (ref 31.5–36.0)
MCV: 96.1 fL (ref 79.5–101.0)
MONO#: 0.7 10*3/uL (ref 0.1–0.9)
MONO%: 8.5 % (ref 0.0–14.0)
NEUT#: 5.4 10*3/uL (ref 1.5–6.5)
NEUT%: 68 % (ref 38.4–76.8)
NRBC: 0 % (ref 0–0)
PLATELETS: 198 10*3/uL (ref 145–400)
RBC: 4.83 10*6/uL (ref 3.70–5.45)
RDW: 13.7 % (ref 11.2–14.5)
WBC: 7.9 10*3/uL (ref 3.9–10.3)

## 2017-06-16 LAB — LACTATE DEHYDROGENASE: LDH: 426 U/L — AB (ref 125–245)

## 2017-06-16 MED ORDER — IOPAMIDOL (ISOVUE-300) INJECTION 61%
100.0000 mL | Freq: Once | INTRAVENOUS | Status: AC | PRN
  Administered 2017-06-16: 100 mL via INTRAVENOUS

## 2017-06-16 MED ORDER — IOPAMIDOL (ISOVUE-300) INJECTION 61%
INTRAVENOUS | Status: AC
  Administered 2017-06-16: 100 mL via INTRAVENOUS
  Filled 2017-06-16: qty 100

## 2017-06-16 NOTE — Progress Notes (Signed)
Remote pacemaker transmission.   

## 2017-06-17 LAB — CUP PACEART REMOTE DEVICE CHECK
Battery Voltage: 2.97 V
Brady Statistic RA Percent Paced: 78.63 %
Date Time Interrogation Session: 20181218212058
Implantable Lead Implant Date: 20130319
Implantable Lead Implant Date: 20130319
Implantable Lead Location: 753860
Lead Channel Impedance Value: 512 Ohm
Lead Channel Setting Pacing Amplitude: 2 V
Lead Channel Setting Sensing Sensitivity: 0.9 mV
MDC IDC LEAD LOCATION: 753859
MDC IDC MSMT LEADCHNL RA IMPEDANCE VALUE: 424 Ohm
MDC IDC MSMT LEADCHNL RA SENSING INTR AMPL: 1.552 mV
MDC IDC MSMT LEADCHNL RV SENSING INTR AMPL: 15.692 mV
MDC IDC PG IMPLANT DT: 20130319

## 2017-06-23 ENCOUNTER — Telehealth: Payer: Self-pay | Admitting: Internal Medicine

## 2017-06-23 ENCOUNTER — Ambulatory Visit (HOSPITAL_BASED_OUTPATIENT_CLINIC_OR_DEPARTMENT_OTHER): Payer: Medicare Other | Admitting: Internal Medicine

## 2017-06-23 ENCOUNTER — Encounter: Payer: Self-pay | Admitting: Internal Medicine

## 2017-06-23 VITALS — BP 125/89 | HR 72 | Temp 97.9°F | Resp 18 | Ht 64.0 in | Wt 133.2 lb

## 2017-06-23 DIAGNOSIS — C8221 Follicular lymphoma grade III, unspecified, lymph nodes of head, face, and neck: Secondary | ICD-10-CM | POA: Diagnosis not present

## 2017-06-23 NOTE — Progress Notes (Signed)
[place] Telephone:([phone]) [phone]   Fax:(336) [phone]  OFFICE PROGRESS NOTE  [name], NP [place] [address]  DIAGNOSIS: Recurrent non-Hodgkin lymphoma, follicular center cell type with predominant follicular pattern favor high-grade (grade 3/3) diagnosed in November 2012.  PRIOR THERAPY: 1) Status post treatment with Rituxan weekly for 3 doses in addition to 3 tablets of Afinitor at [facility] in [place].  2) Status post surgical excision of the right neck mass under the care of Dr. Johney Maine on 10/28/2012.  3)  Maintenance Rituxan 375 mg/M2 every 2 months status post 12 cycles, last dose was given 10/01/2014.  CURRENT THERAPY: Observation.  INTERVAL HISTORY: [name] 82 y.o. female returns to the clinic today for follow-up visit accompanied by her daughter.  The patient is feeling fine today with no specific complaints except for sinus drainage.  She denied having any chest pain, shortness of breath, cough or hemoptysis.  She denied having any fever or chills.  She has no nausea, vomiting, diarrhea or constipation.  She had repeat CT scan of the chest, abdomen and pelvis and she is here today for evaluation and discussion of her scan results.  MEDICAL HISTORY: Past Medical History:  Diagnosis Date  . Anemia    pt denies  . Arthritis   . Atrial fibrillation ([place])   . Cataract    bil  . Cystocele    history with rectocele, pt denies  . Dysrhythmia   . GERD (gastroesophageal reflux disease)   . H/O hiatal hernia   . Hemorrhoid   . History of pancreatitis    pt denies  . Hypothyroidism   . Non Hodgkin's lymphoma ([place])   . Osteoporosis   . Pacemaker 09/15/2011   Medtronic Revo  . Pericarditis   . Pleural effusion, bilateral    history of  . Pulmonary nodules    history of  . Scoliosis   . Varicose vein     ALLERGIES:  is allergic to amoxicillin; buprenorphine; codeine; demerol; morphine and  related; sulfonamide derivatives; and vicodin [hydrocodone-acetaminophen].  MEDICATIONS:  Current Outpatient Medications  Medication Sig Dispense Refill  . acetaminophen (TYLENOL) 500 MG tablet Take 500 mg by mouth every 6 (six) hours as needed for pain.    [name] ALPRAZolam (XANAX) 0.5 MG tablet take 1 tablet by mouth at bedtime if needed for sleep 30 tablet 0  . Calcium Carbonate-Vitamin D (CALTRATE 600+D) 600-400 MG-UNIT per tablet Take 1 tablet by mouth 2 (two) times daily.    [name] denosumab (PROLIA) 60 MG/ML SOLN injection Inject 60 mg into the skin every 6 (six) months. Reported on 10/16/2015    . diltiazem (CARDIZEM CD) 180 MG 24 hr capsule Take one (1) capsule (180 mg) by mouth daily. 90 capsule 3  . fluticasone (FLONASE) 50 MCG/ACT nasal spray Place 2 sprays into both nostrils daily. 16 g 2  . furosemide (LASIX) 20 MG tablet Take 1 tablet (20 mg total) by mouth daily. 90 tablet 3  . gabapentin (NEURONTIN) 300 MG capsule Take 1 capsule (300 mg total) by mouth 3 (three) times daily. 90 capsule 3  . HIZENTRA 4 GM/20ML SOLN Every 4 weeks    . KRILL OIL OMEGA-3 PO Take 1 capsule by mouth daily.    [name] levothyroxine (SYNTHROID, LEVOTHROID) 100 MCG tablet take 1 tablet by mouth once daily 90 tablet 0  . Magnesium Oxide 400 (240 Mg) MG TABS Take 1 tablet (400  mg total) by mouth daily. 30 tablet   . omeprazole (PRILOSEC) 40 MG capsule take 1 capsule by mouth once daily 30 capsule 5  . potassium chloride (K-DUR) 10 MEQ tablet take 1 tablet by mouth once daily 90 tablet 3  . ibuprofen (ADVIL,MOTRIN) 400 MG tablet Take 1 tablet (400 mg total) by mouth every 8 (eight) hours as needed. (Patient not taking: Reported on 06/23/2017) 30 tablet 0   No current facility-administered medications for this visit.     SURGICAL HISTORY:  Past Surgical History:  Procedure Laterality Date  . ABDOMINAL HYSTERECTOMY  04/29/04   partial  . BREAST SURGERY  07/30/01   biopsy x 3, bilateral  . COLONOSCOPY    . EYE SURGERY      cataracts  . MASS EXCISION Right 10/28/2012   Procedure: Removal of mass on neck       ;  Surgeon: Adin Hector, MD;  Location: [facility];  Service: General;  Laterality: Right;  . PACEMAKER INSERTION  09/15/11   MDT  . PERMANENT PACEMAKER INSERTION N/A 09/15/2011   Procedure: PERMANENT PACEMAKER INSERTION;  Surgeon: Sanda Klein, MD;  Location: [facility]ATH [facility];  Service: Cardiovascular;  Laterality: N/A;  . TONSILLECTOMY      REVIEW OF SYSTEMS:  A comprehensive review of systems was negative except for: Ears, nose, mouth, throat, and face: positive for nasal congestion   PHYSICAL EXAMINATION: General appearance: alert, cooperative and no distress Head: Normocephalic, without obvious abnormality, atraumatic Neck: no adenopathy, no JVD, supple, symmetrical, trachea midline and thyroid not enlarged, symmetric, no tenderness/mass/nodules Lymph nodes: Cervical, supraclavicular, and axillary nodes normal. Resp: clear to auscultation bilaterally Back: symmetric, no curvature. ROM normal. No CVA tenderness. Cardio: regular rate and rhythm, S1, S2 normal, no murmur, click, rub or gallop GI: soft, non-tender; bowel sounds normal; no masses,  no organomegaly Extremities: extremities normal, atraumatic, no cyanosis or edema  ECOG PERFORMANCE STATUS: 1 - Symptomatic but completely ambulatory  Blood pressure 125/89, pulse 72, temperature 97.9 F (36.6 C), temperature source Oral, resp. rate 18, height 5\' 4"  (1.626 m), weight 133 lb 3.2 oz (60.4 kg), SpO2 100 %.  LABORATORY DATA: Lab Results  Component Value Date   WBC 7.9 06/16/2017   HGB 15.5 06/16/2017   HCT 46.4 06/16/2017   MCV 96.1 06/16/2017   PLT 198 06/16/2017      Chemistry      Component Value Date/Time   NA 143 06/16/2017 0921   K 3.8 06/16/2017 0921   CL 103 04/07/2017 1114   CL 109 (H) 12/14/2012 1009   CO2 28 06/16/2017 0921   BUN 11.7 06/16/2017 0921   CREATININE 0.8 06/16/2017 0921      Component Value Date/Time    CALCIUM 9.7 06/16/2017 0921   ALKPHOS 63 06/16/2017 0921   AST 23 06/16/2017 0921   ALT 18 06/16/2017 0921   BILITOT 0.52 06/16/2017 0921       RADIOGRAPHIC STUDIES: Ct Chest W Contrast  Result Date: 06/16/2017 CLINICAL DATA:  Non-Hodgkin's lymphoma. EXAM: CT CHEST, ABDOMEN, AND PELVIS WITH CONTRAST TECHNIQUE: Multidetector CT imaging of the chest, abdomen and pelvis was performed following the standard protocol during bolus administration of intravenous contrast. CONTRAST:  100 cc Isovue-300 COMPARISON:  06/18/2016 FINDINGS: CT CHEST FINDINGS Cardiovascular: Heart size mildly enlarged. Trace pericardial effusion similar to prior. Coronary artery calcification is evident. Atherosclerotic calcification is noted in the wall of the thoracic aorta. Left permanent pacemaker again noted. Mediastinum/Nodes: No mediastinal lymphadenopathy. There is no  hilar lymphadenopathy. The esophagus has normal imaging features. There is no axillary lymphadenopathy. Lungs/Pleura: Stable appearance of numerous calcified and noncalcified parenchymal and subpleural pulmonary nodules, including the index right lower lobe nodule measured previously at 6 mm (seen today on image 60 series 4). No focal airspace consolidation. No pulmonary edema or pleural effusion. Chronic atelectasis again noted in the medial aspect of the posterior right costophrenic sulcus. Musculoskeletal: Marked thoracolumbar scoliosis again noted. Bone windows reveal no worrisome lytic or sclerotic osseous lesions. CT ABDOMEN PELVIS FINDINGS Hepatobiliary: Scattered tiny hypoattenuating lesions in the liver are similar. Small hypervascular subcapsular focus in the dome of the right liver is also unchanged. There is no evidence for gallstones, gallbladder wall thickening, or pericholecystic fluid. No intrahepatic or extrahepatic biliary dilation. Pancreas: No focal mass lesion. No dilatation of the main duct. No intraparenchymal cyst. No peripancreatic edema.  Spleen: No splenomegaly. No focal mass lesion. Adrenals/Urinary Tract: No adrenal nodule or mass. Kidneys unremarkable. No evidence for hydroureter. The urinary bladder appears normal for the degree of distention. Stomach/Bowel: Large hiatal hernia contains 75% of the stomach with organoaxial volvulus. No gastric outlet obstruction. Duodenum is normally positioned as is the ligament of Treitz. No small bowel wall thickening. No small bowel dilatation. The terminal ileum is normal. The appendix is normal. Diverticular changes are noted in the left colon without evidence of diverticulitis. Vascular/Lymphatic: There is abdominal aortic atherosclerosis without aneurysm. There is no gastrohepatic or hepatoduodenal ligament lymphadenopathy. No intraperitoneal or retroperitoneal lymphadenopathy. No pelvic sidewall lymphadenopathy. Reproductive: Uterus surgically absent.  There is no adnexal mass. Other: No intraperitoneal free fluid. Musculoskeletal: Bone windows reveal no worrisome lytic or sclerotic osseous lesions. Marked thoracolumbar scoliosis. IMPRESSION: 1. Stable exam. No new or progressive findings. No lymphadenopathy in the chest, abdomen, or pelvis. 2. Stable appearance scattered calcified and noncalcified pulmonary nodules measuring up to 6 mm. These remain compatible with benign etiology. 3.  Aortic Atherosclerois (ICD10-170.0) Electronically Signed   By: Misty Stanley M.D.   On: 06/16/2017 14:02   Ct Abdomen Pelvis W Contrast  Result Date: 06/16/2017 CLINICAL DATA:  Non-Hodgkin's lymphoma. EXAM: CT CHEST, ABDOMEN, AND PELVIS WITH CONTRAST TECHNIQUE: Multidetector CT imaging of the chest, abdomen and pelvis was performed following the standard protocol during bolus administration of intravenous contrast. CONTRAST:  100 cc Isovue-300 COMPARISON:  06/18/2016 FINDINGS: CT CHEST FINDINGS Cardiovascular: Heart size mildly enlarged. Trace pericardial effusion similar to prior. Coronary artery calcification is  evident. Atherosclerotic calcification is noted in the wall of the thoracic aorta. Left permanent pacemaker again noted. Mediastinum/Nodes: No mediastinal lymphadenopathy. There is no hilar lymphadenopathy. The esophagus has normal imaging features. There is no axillary lymphadenopathy. Lungs/Pleura: Stable appearance of numerous calcified and noncalcified parenchymal and subpleural pulmonary nodules, including the index right lower lobe nodule measured previously at 6 mm (seen today on image 60 series 4). No focal airspace consolidation. No pulmonary edema or pleural effusion. Chronic atelectasis again noted in the medial aspect of the posterior right costophrenic sulcus. Musculoskeletal: Marked thoracolumbar scoliosis again noted. Bone windows reveal no worrisome lytic or sclerotic osseous lesions. CT ABDOMEN PELVIS FINDINGS Hepatobiliary: Scattered tiny hypoattenuating lesions in the liver are similar. Small hypervascular subcapsular focus in the dome of the right liver is also unchanged. There is no evidence for gallstones, gallbladder wall thickening, or pericholecystic fluid. No intrahepatic or extrahepatic biliary dilation. Pancreas: No focal mass lesion. No dilatation of the main duct. No intraparenchymal cyst. No peripancreatic edema. Spleen: No splenomegaly. No focal mass lesion. Adrenals/Urinary Tract:  No adrenal nodule or mass. Kidneys unremarkable. No evidence for hydroureter. The urinary bladder appears normal for the degree of distention. Stomach/Bowel: Large hiatal hernia contains 75% of the stomach with organoaxial volvulus. No gastric outlet obstruction. Duodenum is normally positioned as is the ligament of Treitz. No small bowel wall thickening. No small bowel dilatation. The terminal ileum is normal. The appendix is normal. Diverticular changes are noted in the left colon without evidence of diverticulitis. Vascular/Lymphatic: There is abdominal aortic atherosclerosis without aneurysm. There is  no gastrohepatic or hepatoduodenal ligament lymphadenopathy. No intraperitoneal or retroperitoneal lymphadenopathy. No pelvic sidewall lymphadenopathy. Reproductive: Uterus surgically absent.  There is no adnexal mass. Other: No intraperitoneal free fluid. Musculoskeletal: Bone windows reveal no worrisome lytic or sclerotic osseous lesions. Marked thoracolumbar scoliosis. IMPRESSION: 1. Stable exam. No new or progressive findings. No lymphadenopathy in the chest, abdomen, or pelvis. 2. Stable appearance scattered calcified and noncalcified pulmonary nodules measuring up to 6 mm. These remain compatible with benign etiology. 3.  Aortic Atherosclerois (ICD10-170.0) Electronically Signed   By: Misty Stanley M.D.   On: 06/16/2017 14:02    ASSESSMENT AND PLAN:  This is a very pleasant 82 years old white female with recurrent non-Hodgkin lymphoma, follicular center cell type status post treatment with Rituxan as well as Afinitor at [facility] and also completed a course of maintenance Rituxan for 12 cycles last dose was given 10/01/2014. The patient continues to do well with no significant complaints. Her recent CT scan of the chest, abdomen and pelvis showed no new or progressive findings. I discussed the scan results with the patient and her daughter and recommended for her to continue in observation with repeat blood work in 6 months. She was advised to call immediately if she has any concerning symptoms in the interval. The patient voices understanding of current disease status and treatment options and is in agreement with the current care plan. All questions were answered. The patient knows to call the clinic with any problems, questions or concerns. We can certainly see the patient much sooner if necessary.   Disclaimer: This note was dictated with voice recognition software. Similar sounding words can inadvertently be transcribed and may not be corrected upon review.

## 2017-06-23 NOTE — Telephone Encounter (Signed)
Scheduled appt per 12/26 los - lab and f.u in 6 months - reminder letter sent in the mail.

## 2017-07-02 ENCOUNTER — Telehealth: Payer: Self-pay | Admitting: *Deleted

## 2017-07-02 NOTE — Telephone Encounter (Signed)
Copied from [place]. Topic: General - Other >> Jun 02, 2017 10:53 AM Laren Everts wrote: Reason for CRM: letter mailed informing patient of Prolia benefits and to schedule a nurse visit  If patient schedules please send CRM so CMA can order prolia  >> Jul 01, 2017  1:59 PM Oliver Pila B wrote: Pt has made her appt for her prolia shot on Jan 11th @ 3:30, contact pt if needed

## 2017-07-05 ENCOUNTER — Other Ambulatory Visit: Payer: Self-pay | Admitting: Family

## 2017-07-06 NOTE — Telephone Encounter (Signed)
Medication has been received in the office.

## 2017-07-09 ENCOUNTER — Ambulatory Visit (INDEPENDENT_AMBULATORY_CARE_PROVIDER_SITE_OTHER): Payer: Medicare Other

## 2017-07-09 DIAGNOSIS — M818 Other osteoporosis without current pathological fracture: Secondary | ICD-10-CM | POA: Diagnosis not present

## 2017-07-09 MED ORDER — DENOSUMAB 60 MG/ML ~~LOC~~ SOLN
60.0000 mg | Freq: Once | SUBCUTANEOUS | Status: AC
  Administered 2017-07-09: 60 mg via SUBCUTANEOUS

## 2017-07-09 NOTE — Progress Notes (Signed)
Pre visit review using our clinic tool,if applicable. No additional management support is needed unless otherwise documented below in the visit note.   Patient in for Prolia injection per order from M. Inda Castle, NP due to patient having diagnosis of Osteoporosis.  No complaints voiced this visit. Patietn states she feels the injections are helping her.   Given 60 mg SQ left arm. Patient tolerated well. Reminder card given for next injection.

## 2017-07-14 ENCOUNTER — Other Ambulatory Visit: Payer: Self-pay | Admitting: Obstetrics and Gynecology

## 2017-07-14 DIAGNOSIS — N6320 Unspecified lump in the left breast, unspecified quadrant: Secondary | ICD-10-CM

## 2017-07-14 DIAGNOSIS — Z01419 Encounter for gynecological examination (general) (routine) without abnormal findings: Secondary | ICD-10-CM | POA: Diagnosis not present

## 2017-07-14 DIAGNOSIS — Z779 Other contact with and (suspected) exposures hazardous to health: Secondary | ICD-10-CM | POA: Diagnosis not present

## 2017-07-14 DIAGNOSIS — Z6823 Body mass index (BMI) 23.0-23.9, adult: Secondary | ICD-10-CM | POA: Diagnosis not present

## 2017-07-20 ENCOUNTER — Ambulatory Visit
Admission: RE | Admit: 2017-07-20 | Discharge: 2017-07-20 | Disposition: A | Discharge status: Home / Self Care | Payer: Medicare Other | Source: Ambulatory Visit | Attending: Obstetrics and Gynecology | Admitting: Obstetrics and Gynecology

## 2017-07-20 ENCOUNTER — Other Ambulatory Visit: Payer: Self-pay | Admitting: Obstetrics and Gynecology

## 2017-07-20 DIAGNOSIS — N6320 Unspecified lump in the left breast, unspecified quadrant: Secondary | ICD-10-CM

## 2017-07-20 DIAGNOSIS — R2232 Localized swelling, mass and lump, left upper limb: Secondary | ICD-10-CM

## 2017-07-20 DIAGNOSIS — R921 Mammographic calcification found on diagnostic imaging of breast: Secondary | ICD-10-CM

## 2017-07-20 DIAGNOSIS — R922 Inconclusive mammogram: Secondary | ICD-10-CM | POA: Diagnosis not present

## 2017-07-21 ENCOUNTER — Telehealth: Payer: Self-pay | Admitting: Family

## 2017-07-21 NOTE — Telephone Encounter (Signed)
Prolia benefits received PA not required 20% con-insurance for Prolia $5 copay if OV charged   Patient may owe approximately $79 OOP  Patient due after 12.23.18  Letter mailed to inform patient of benefits and to schedule

## 2017-07-22 ENCOUNTER — Ambulatory Visit
Admission: RE | Admit: 2017-07-22 | Discharge: 2017-07-22 | Disposition: A | Discharge status: Home / Self Care | Payer: Medicare Other | Source: Ambulatory Visit | Attending: Obstetrics and Gynecology | Admitting: Obstetrics and Gynecology

## 2017-07-22 DIAGNOSIS — N6012 Diffuse cystic mastopathy of left breast: Secondary | ICD-10-CM | POA: Diagnosis not present

## 2017-07-22 DIAGNOSIS — R921 Mammographic calcification found on diagnostic imaging of breast: Secondary | ICD-10-CM

## 2017-07-25 ENCOUNTER — Other Ambulatory Visit: Payer: Self-pay | Admitting: General Surgery

## 2017-07-25 DIAGNOSIS — R928 Other abnormal and inconclusive findings on diagnostic imaging of breast: Secondary | ICD-10-CM

## 2017-07-26 DIAGNOSIS — R928 Other abnormal and inconclusive findings on diagnostic imaging of breast: Secondary | ICD-10-CM | POA: Diagnosis not present

## 2017-07-28 ENCOUNTER — Other Ambulatory Visit: Payer: Self-pay | Admitting: Family

## 2017-07-29 NOTE — Telephone Encounter (Signed)
Requesting:Alprazolam Contract: 09/15/16 UDS: 03/31/16 Last Visit: 05/27/17 Next Visit: No upcoming appt Last Refill: 06/04/17  Please Advise

## 2017-07-30 ENCOUNTER — Other Ambulatory Visit: Payer: Self-pay | Admitting: General Surgery

## 2017-07-30 DIAGNOSIS — R928 Other abnormal and inconclusive findings on diagnostic imaging of breast: Secondary | ICD-10-CM

## 2017-07-30 NOTE — Telephone Encounter (Signed)
[place] controlled substance registry.  Refills are appropriate.  Refill sent.

## 2017-08-02 NOTE — Telephone Encounter (Signed)
Pt received Prolia on 07/09/17.

## 2017-08-04 ENCOUNTER — Telehealth: Payer: Self-pay | Admitting: *Deleted

## 2017-08-04 NOTE — Telephone Encounter (Signed)
   [facility] Pre-operative Risk Assessment    Request for surgical clearance:  1. What type of surgery is being performed? LUMPECTOMY    2. When is this surgery scheduled? TBD   3. What type of clearance is required (medical clearance vs. Pharmacy clearance to hold med vs. Both)? MEDICAL  4. Are there any medications that need to be held prior to surgery and how long?    5. Practice name and name of physician performing surgery? [facility]   6. What is your office phone and fax number? Phone [phone] FAX [phone] ATTN April    7. Anesthesia type (None, local, MAC, general) ? GENERAL

## 2017-08-05 NOTE — Telephone Encounter (Signed)
Called [facility] Dr. Marlowe Aschoff office and notified staff that clearance was sent over this afternoon.

## 2017-08-05 NOTE — Telephone Encounter (Signed)
   Primary Cardiologist: Sanda Klein, MD  Chart reviewed as part of pre-operative protocol coverage. Patient was contacted 08/05/2017 in reference to pre-operative risk assessment for pending surgery as outlined below.  [name] was last seen on 04/07/17 by Dr. Sallyanne Kuster.  Since that day, [name] has done well. She can complete more than 4.0 METS.  Therefore, based on ACC/AHA guidelines, the patient would be at acceptable risk for the planned procedure without further cardiovascular testing.   I will route this recommendation to the requesting party via Epic fax function and remove from pre-op pool.  Preop call back pool, please call office and make sure they received this documentation.  Please call with questions.  Ledora Bottcher, PA 08/05/2017, 2:33 PM

## 2017-08-09 ENCOUNTER — Other Ambulatory Visit: Payer: Self-pay

## 2017-08-09 ENCOUNTER — Encounter (HOSPITAL_COMMUNITY): Payer: Self-pay

## 2017-08-09 ENCOUNTER — Inpatient Hospital Stay (HOSPITAL_COMMUNITY): Admission: RE | Admit: 2017-08-09 | Payer: Medicare Other | Source: Ambulatory Visit

## 2017-08-09 ENCOUNTER — Telehealth: Payer: Self-pay | Admitting: Cardiovascular Disease

## 2017-08-09 ENCOUNTER — Encounter (HOSPITAL_COMMUNITY)
Admission: RE | Admit: 2017-08-09 | Discharge: 2017-08-09 | Disposition: A | Discharge status: Home / Self Care | Payer: Medicare Other | Source: Ambulatory Visit | Attending: General Surgery | Admitting: General Surgery

## 2017-08-09 DIAGNOSIS — N6012 Diffuse cystic mastopathy of left breast: Secondary | ICD-10-CM | POA: Diagnosis not present

## 2017-08-09 DIAGNOSIS — Z885 Allergy status to narcotic agent status: Secondary | ICD-10-CM | POA: Diagnosis not present

## 2017-08-09 DIAGNOSIS — Z8249 Family history of ischemic heart disease and other diseases of the circulatory system: Secondary | ICD-10-CM | POA: Diagnosis not present

## 2017-08-09 DIAGNOSIS — Z79899 Other long term (current) drug therapy: Secondary | ICD-10-CM | POA: Diagnosis not present

## 2017-08-09 DIAGNOSIS — I10 Essential (primary) hypertension: Secondary | ICD-10-CM | POA: Diagnosis not present

## 2017-08-09 DIAGNOSIS — E039 Hypothyroidism, unspecified: Secondary | ICD-10-CM | POA: Diagnosis not present

## 2017-08-09 DIAGNOSIS — R928 Other abnormal and inconclusive findings on diagnostic imaging of breast: Secondary | ICD-10-CM | POA: Diagnosis present

## 2017-08-09 DIAGNOSIS — Z9889 Other specified postprocedural states: Secondary | ICD-10-CM | POA: Diagnosis not present

## 2017-08-09 DIAGNOSIS — Z886 Allergy status to analgesic agent status: Secondary | ICD-10-CM | POA: Diagnosis not present

## 2017-08-09 DIAGNOSIS — N6022 Fibroadenosis of left breast: Secondary | ICD-10-CM | POA: Diagnosis not present

## 2017-08-09 DIAGNOSIS — Z882 Allergy status to sulfonamides status: Secondary | ICD-10-CM | POA: Diagnosis not present

## 2017-08-09 DIAGNOSIS — Z8572 Personal history of non-Hodgkin lymphomas: Secondary | ICD-10-CM | POA: Diagnosis not present

## 2017-08-09 DIAGNOSIS — Z79890 Hormone replacement therapy (postmenopausal): Secondary | ICD-10-CM | POA: Diagnosis not present

## 2017-08-09 DIAGNOSIS — K219 Gastro-esophageal reflux disease without esophagitis: Secondary | ICD-10-CM | POA: Diagnosis not present

## 2017-08-09 DIAGNOSIS — Z7951 Long term (current) use of inhaled steroids: Secondary | ICD-10-CM | POA: Diagnosis not present

## 2017-08-09 DIAGNOSIS — Z880 Allergy status to penicillin: Secondary | ICD-10-CM | POA: Diagnosis not present

## 2017-08-09 DIAGNOSIS — K449 Diaphragmatic hernia without obstruction or gangrene: Secondary | ICD-10-CM | POA: Diagnosis not present

## 2017-08-09 LAB — CBC
HCT: 43.7 % (ref 36.0–46.0)
Hemoglobin: 14.4 g/dL (ref 12.0–15.0)
MCH: 32.1 pg (ref 26.0–34.0)
MCHC: 33 g/dL (ref 30.0–36.0)
MCV: 97.3 fL (ref 78.0–100.0)
PLATELETS: 250 10*3/uL (ref 150–400)
RBC: 4.49 MIL/uL (ref 3.87–5.11)
RDW: 13.5 % (ref 11.5–15.5)
WBC: 8.4 10*3/uL (ref 4.0–10.5)

## 2017-08-09 LAB — COMPREHENSIVE METABOLIC PANEL
ALT: 17 U/L (ref 14–54)
AST: 29 U/L (ref 15–41)
Albumin: 4 g/dL (ref 3.5–5.0)
Alkaline Phosphatase: 62 U/L (ref 38–126)
Anion gap: 12 (ref 5–15)
BILIRUBIN TOTAL: 0.6 mg/dL (ref 0.3–1.2)
BUN: 9 mg/dL (ref 6–20)
CO2: 24 mmol/L (ref 22–32)
CREATININE: 0.62 mg/dL (ref 0.44–1.00)
Calcium: 9.5 mg/dL (ref 8.9–10.3)
Chloride: 103 mmol/L (ref 101–111)
Glucose, Bld: 81 mg/dL (ref 65–99)
Potassium: 4.2 mmol/L (ref 3.5–5.1)
Sodium: 139 mmol/L (ref 135–145)
TOTAL PROTEIN: 6.8 g/dL (ref 6.5–8.1)

## 2017-08-09 NOTE — H&P (Signed)
[name] Appointment: 07/26/2017 9:00 AM Location: [facility] Patient #: [id number] DOB: 06-22-1935 Widowed / Language: Cleophus Molt / Race: White Female   History of Present Illness Erica Klein MD; 07/26/2017 9:43 AM) The patient is a 83 year old female who presents with a complaint of Breast problems. Pt is an 83 yo F who presented with a palpable mass in the left axilla that seemed to resolve by the time of dx mammogram. She was concerned about her lymphoma. However, a small group of calcifications were seen in the lateral left breast. She has a history of non hodkin's lymphoma. She has had bilateral breast excisions in the past, the last of which was around 40 years ago. She also has a sister who had breast cancer in her 37s. She complains of bilateral breast soreness. She got some bruising at her biopsy site.   Of note, her daughter used to work on the IV team at [employer].    Dx mammogram 07/20/17 ACR Breast Density Category c: The breast tissue is heterogeneously dense, which may obscure small masses.  FINDINGS: The left axilla cannot be well assessed due to a pacemaker. There is a new group of calcifications in the lateral left breast which span 5 mm. These do not layer on the 90 degree lateral view.  Mammographic images were processed with CAD.  On physical exam, no suspicious lumps are identified.  Targeted ultrasound is performed, showing no sonographic abnormalities in the left axilla.  IMPRESSION: New indeterminate calcifications in the lateral slightly superior left breast.  RECOMMENDATION: Recommend stereotactic biopsy of the new left breast calcifications.  I have discussed the findings and recommendations with the patient. Results were also provided in writing at the conclusion of the visit. If applicable, a reminder letter will be sent to the patient regarding the next appointment.  BI-RADS CATEGORY 4: Suspicious.  Pathology  07/22/2017 Breast, left, needle core biopsy, lateral - FIBROADENOMATOID CHANGE WITH ATYPCIAL DUCTAL HYPERPLASIA AND CALCIFICATIONS. - SCLEROSING ADENOSIS AND FIBROCYSTIC CHANGE.    Past Surgical History Malachi Bonds, CMA; 07/26/2017 8:53 AM) Breast Biopsy  Left. Breast Mass; Local Excision  Bilateral. Hysterectomy (not due to cancer) - Partial  Oral Surgery   Diagnostic Studies History Malachi Bonds, CMA; 07/26/2017 8:53 AM) Colonoscopy  1-5 years ago Mammogram  1-3 years ago  Allergies Malachi Bonds, CMA; 07/26/2017 8:56 AM) Amoxicillin *PENICILLINS*  Buprenorphine *ANALGESICS - OPIOID*  Codeine Phosphate *ANALGESICS - OPIOID*  Demerol *ANALGESICS - OPIOID*  Morphine Sulfate (PF) *ANALGESICS - OPIOID*  Sulfonamide Derivatives  Vicodin *ANALGESICS - OPIOID*   Medication History (Chemira Jones, CMA; 07/26/2017 8:58 AM) Levothyroxine Sodium (100MCG Tablet, Oral) Active. Omeprazole (40MG  Capsule DR, Oral) Active. Potassium Chloride ER (10MEQ Tablet ER, Oral) Active. Furosemide (20MG  Tablet, Oral) Active. ALPRAZolam (0.5MG  Tablet, Oral) Active. DilTIAZem HCl ER Coated Beads (180MG  Capsule ER 24HR, Oral) Active. Caltrate 600+D (600-400MG -UNIT Tablet Chewable, Oral) Active. Hizentra (4GM/20ML Solution, Subcutaneous) Active. Krill Oil Omega-3 (500MG  Capsule, Oral) Active. Magnesium Oxide (400MG  Capsule, Oral) Active. Medications Reconciled  Social History Malachi Bonds, CMA; 07/26/2017 8:53 AM) Alcohol use  Occasional alcohol use. Caffeine use  Coffee. No drug use  Tobacco use  Never smoker.  Family History Malachi Bonds, CMA; 07/26/2017 8:53 AM) Hypertension  Father, Mother.  Pregnancy / Birth History Malachi Bonds, CMA; 07/26/2017 8:53 AM) Contraceptive History  Oral contraceptives. Gravida  3 Maternal age  86-20 Para  3  Other Problems Malachi Bonds, CMA; 07/26/2017 8:53 AM) Gastroesophageal Reflux Disease  Review of Systems  Malachi Bonds CMA; 07/26/2017 8:53 AM) General Not Present- Appetite Loss, Chills, Fatigue, Fever, Night Sweats, Weight Gain and Weight Loss. Skin Not Present- Change in Wart/Mole, Dryness, Hives, Jaundice, New Lesions, Non-Healing Wounds, Rash and Ulcer. HEENT Not Present- Earache, Hearing Loss, Hoarseness, Nose Bleed, Oral Ulcers, Ringing in the Ears, Seasonal Allergies, Sinus Pain, Sore Throat, Visual Disturbances, Wears glasses/contact lenses and Yellow Eyes. Respiratory Not Present- Bloody sputum, Chronic Cough, Difficulty Breathing, Snoring and Wheezing. Breast Present- Breast Mass. Not Present- Breast Pain, Nipple Discharge and Skin Changes. Gastrointestinal Not Present- Abdominal Pain, Bloating, Bloody Stool, Change in Bowel Habits, Chronic diarrhea, Constipation, Difficulty Swallowing, Excessive gas, Gets full quickly at meals, Hemorrhoids, Indigestion, Nausea, Rectal Pain and Vomiting. Female Genitourinary Not Present- Frequency, Nocturia, Painful Urination, Pelvic Pain and Urgency. Neurological Not Present- Decreased Memory, Fainting, Headaches, Numbness, Seizures, Tingling, Tremor, Trouble walking and Weakness. Psychiatric Not Present- Anxiety, Bipolar, Change in Sleep Pattern, Depression, Fearful and Frequent crying. Endocrine Not Present- Cold Intolerance, Excessive Hunger, Hair Changes, Heat Intolerance, Hot flashes and New Diabetes. Hematology Present- Easy Bruising. Not Present- Blood Thinners, Excessive bleeding, Gland problems, HIV and Persistent Infections.  Vitals (Chemira Jones CMA; 07/26/2017 8:54 AM) 07/26/2017 8:53 AM Weight: 132.8 lb Height: 61in Body Surface Area: 1.59 m Body Mass Index: 25.09 kg/m  Temp.: 97.26F(Oral)  BP: 132/70 (Sitting, Left Arm, Standard)       Physical Exam Erica Klein MD; 07/26/2017 9:38 AM) General Mental Status-Alert. General Appearance-Consistent with stated age. Hydration-Well hydrated. Voice-Normal.  Head and  Neck Head-normocephalic, atraumatic with no lesions or palpable masses. Trachea-midline. Thyroid Gland Characteristics - normal size and consistency.  Eye Eyeball - Bilateral-Extraocular movements intact. Sclera/Conjunctiva - Bilateral-No scleral icterus.  Chest and Lung Exam Chest and lung exam reveals -quiet, even and easy respiratory effort with no use of accessory muscles and on auscultation, normal breath sounds, no adventitious sounds and normal vocal resonance. Inspection Chest Wall - Normal. Back - normal. Note: left sided pacemaker.   Breast Note: breasts are symmetric bilaterally. breasts are dense for age and tender, more so on the left. No palpable masses. No skin dimpling or nipple discharge. Faint incisional scars on both upper breasts. The left one is transverse and lateral; the right is more radial in the upper outer quadrant. No LAD.   Cardiovascular Cardiovascular examination reveals -normal heart sounds, regular rate and rhythm with no murmurs and normal pedal pulses bilaterally.  Abdomen Inspection Inspection of the abdomen reveals - No Hernias. Palpation/Percussion Palpation and Percussion of the abdomen reveal - Soft, Non Tender, No Rebound tenderness, No Rigidity (guarding) and No hepatosplenomegaly. Auscultation Auscultation of the abdomen reveals - Bowel sounds normal.  Neurologic Neurologic evaluation reveals -alert and oriented x 3 with no impairment of recent or remote memory. Mental Status-Normal.  Musculoskeletal Global Assessment -Note: no gross deformities.  Normal Exam - Left-Upper Extremity Strength Normal and Lower Extremity Strength Normal. Normal Exam - Right-Upper Extremity Strength Normal and Lower Extremity Strength Normal.  Lymphatic Head & Neck  General Head & Neck Lymphatics: Bilateral - Description - Normal. Axillary  General Axillary Region: Bilateral - Description - Normal. Tenderness - Non  Tender. Femoral & Inguinal  Generalized Femoral & Inguinal Lymphatics: Bilateral - Description - No Generalized lymphadenopathy.    Assessment & Plan Erica Klein MD; 07/26/2017 9:42 AM) ABNORMAL MAMMOGRAM OF LEFT BREAST (R92.8) Impression: Pt has a core needle biopsy with ADH and sclerosing adenosis. I recommend excision.  I discussed rationale for excision. The surgical  procedure was described to the patient. I discussed the incision type and location and that we would need radiology involved on with a wire or seed marker and/or sentinel node.  The risks and benefits of the procedure were described to the patient and she wishes to proceed.  We discussed the risks bleeding, infection, damage to other structures, need for further procedures/surgeries. We discussed the risk of seroma. The patient was advised if the area in the breast in cancer, we may need to go back to surgery for additional tissue to obtain negative margins or for a lymph node biopsy. The patient was advised that these are the most common complications, but that others can occur as well. They were advised against taking aspirin or other anti-inflammatory agents/blood thinners the week before surgery.  The patient reports some event occuring during surgery at [facility] this past summer. She is unaware of exactly what occurred, but we will get records to evaluate.  She will also need cardiac clearance due to her pacer. Current Plans Pt Education - CSS Breast Biopsy Instructions (FLB): discussed with patient and provided information. You are being scheduled for surgery- Our schedulers will call you.  You should hear from our office's scheduling department within 5 working days about the location, date, and time of surgery. We try to make accommodations for patient's preferences in scheduling surgery, but sometimes the OR schedule or the surgeon's schedule prevents [place] from making those accommodations.  If you have not heard from  our office [phone]) in 5 working days, call the office and ask for your surgeon's nurse.  If you have other questions about your diagnosis, plan, or surgery, call the office and ask for your surgeon's nurse.    Signed by Erica Klein, MD (07/26/2017 9:43 AM)

## 2017-08-09 NOTE — Telephone Encounter (Signed)
[name] ( [facility] Pre- Admit ) is calling to find out if radioactive seed implanted to breast will affect [name]

## 2017-08-09 NOTE — Telephone Encounter (Signed)
Patient is scheduled for left radioactive seed implant tomorrow (08/11/17). Pre-admissions inquiring about whether this procedure will affect her pacemaker. Please advise?

## 2017-08-09 NOTE — Progress Notes (Signed)
Medtronic pacer placed 08/2011, Dr. Beckie Salts  Note via email sent to Mardene Sayer & Lindsi She sees Dr. Jerilynn Mages Croitoru  [place] 05/2017 Denies any murmur, sob, cp.  Echo done 2017  PCP is  [name], [place]  LOV 03/2017 Oncologist is Dr. Jearld Pies  LOV 05/2017 I called the patient back regarding the question about radioactive seed & pacer--had to leave VM

## 2017-08-09 NOTE — Telephone Encounter (Signed)
I do not think it will cause any problems with her pacemaker. MCr

## 2017-08-09 NOTE — Progress Notes (Addendum)
Medtronic pacer placed 08/2011, Dr. Beckie Salts  Note via email sent to Mardene Sayer & Lindsi She sees Dr. Jerilynn Mages Croitoru  [place] 05/2017 Denies any murmur, sob, cp.  Echo done 2017  PCP is  [name], [place]  LOV 03/2017 Oncologist is Dr. Jearld Pies  LOV 05/2017

## 2017-08-09 NOTE — Pre-Procedure Instructions (Signed)
[name]  08/09/2017      [facility] [place], [place] [address] [place] [address] Phone: [phone] Fax: [place], [place] - [address] [place] [address] Phone: [phone] Fax: [phone]    Your procedure is scheduled on Wednesday, February 13th   Report to [place] at [place].M.             (posted surgery time 8:30a - 9:30a)   Call this number if you have problems the morning of surgery:  [phone]   Remember:   Do not eat food or drink liquids after midnight, Tuesday.              Exception to the rule, Please drink your "ensure pre surgical drink" prior to you leaving your house.  Continue all medications as directed by your physician except follow these medication instructions before surgery below   Take these medicines the morning of surgery with A SIP OF WATER  acetaminophen (TYLENOL)  diltiazem (CARDIZEM CD)  fluticasone (FLONASE) gabapentin (NEURONTIN)  levothyroxine (SYNTHROID, LEVOTHROID) loratadine (CLARITIN)  omeprazole (PRILOSEC traMADol (ULTRAM)   Do not wear jewelry, make-up or nail polish.  Do not wear lotions, powders,  perfumes, or deodorant.  Do not shave 48 hours prior to surgery.   Do not bring valuables to the hospital.  [facility] is not responsible for any belongings or valuables.  Contacts, dentures or bridgework may not be worn into surgery.  Leave your suitcase in the car.  After surgery it may be brought to your room.  For patients admitted to the hospital, discharge time will be determined by your treatment team.  Patients discharged the day of surgery will not be allowed to drive home, and will need someone to stay with you for the first 24 hrs.      [facility]- Preparing For Surgery  Before surgery, you can play an important  role. Because skin is not sterile, your skin needs to be as free of germs as possible. You can reduce the number of germs on your skin by washing with CHG (chlorahexidine gluconate) Soap before surgery.  CHG is an antiseptic cleaner which kills germs and bonds with the skin to continue killing germs even after washing.  Please do not use if you have an allergy to CHG or antibacterial soaps. If your skin becomes reddened/irritated stop using the CHG.  Do not shave (including legs and underarms) for at least 48 hours prior to first CHG shower. It is OK to shave your face.  Please follow these instructions carefully.   1. Shower the NIGHT BEFORE SURGERY and the MORNING OF SURGERY with CHG.   2. If you chose to wash your hair, wash your hair first as usual with your normal shampoo.  3. After you shampoo, rinse your hair and body thoroughly to remove the shampoo.  4. Use CHG as you would any other liquid soap. You can apply CHG directly to the skin and wash gently with a scrungie or a clean washcloth.   5. Apply the CHG Soap to your body ONLY FROM THE NECK DOWN.  Do not use on open wounds or open sores. Avoid contact with your eyes, ears, mouth and genitals (private parts). Wash Face and genitals (private parts)  with your normal soap.  6. Wash thoroughly, paying special  attention to the area where your surgery will be performed.  7. Thoroughly rinse your body with warm water from the neck down.  8. DO NOT shower/wash with your normal soap after using and rinsing off the CHG Soap.  9. Pat yourself dry with a CLEAN TOWEL.  10. Wear CLEAN PAJAMAS to bed the night before surgery, wear comfortable clothes the morning of surgery  11. Place CLEAN SHEETS on your bed the night of your first shower and DO NOT SLEEP WITH PETS.    Day of Surgery: Do not apply any deodorants/lotions. Please wear clean clothes to the hospital/surgery center.      Please read over the following fact sheets that you  were given.

## 2017-08-09 NOTE — Pre-Procedure Instructions (Signed)
Shayli BASSY FETTERLY  08/09/2017      [facility] [place], [place] [address] [place] [address] Phone: [phone] Fax: [place], [place] - [address] [place] [address] Phone: [phone] Fax: [phone]    Your procedure is scheduled on February 13  Report to [place] at [place].M.  Call this number if you have problems the morning of surgery:  [phone]   Remember:  Do not eat food or drink liquids after midnight.  Continue all medications as directed by your physician except follow these medication instructions before surgery below   Take these medicines the morning of surgery with A SIP OF WATER  acetaminophen (TYLENOL)  diltiazem (CARDIZEM CD)  fluticasone (FLONASE) gabapentin (NEURONTIN)  levothyroxine (SYNTHROID, LEVOTHROID) loratadine (CLARITIN)  omeprazole (PRILOSEC traMADol (ULTRAM)   Do not wear jewelry, make-up or nail polish.  Do not wear lotions, powders, or perfumes, or deodorant.  Do not shave 48 hours prior to surgery.   Do not bring valuables to the hospital.  [facility] is not responsible for any belongings or valuables.  Contacts, dentures or bridgework may not be worn into surgery.  Leave your suitcase in the car.  After surgery it may be brought to your room.  For patients admitted to the hospital, discharge time will be determined by your treatment team.  Patients discharged the day of surgery will not be allowed to drive home.    Special instructions:   [place]- Preparing For Surgery  Before surgery, you can play an important role. Because skin is not sterile, your skin needs to be as free of germs as possible. You can reduce the number of germs on your skin by washing with CHG (chlorahexidine gluconate) Soap before surgery.  CHG is an antiseptic cleaner  which kills germs and bonds with the skin to continue killing germs even after washing.  Please do not use if you have an allergy to CHG or antibacterial soaps. If your skin becomes reddened/irritated stop using the CHG.  Do not shave (including legs and underarms) for at least 48 hours prior to first CHG shower. It is OK to shave your face.  Please follow these instructions carefully.   1. Shower the NIGHT BEFORE SURGERY and the MORNING OF SURGERY with CHG.   2. If you chose to wash your hair, wash your hair first as usual with your normal shampoo.  3. After you shampoo, rinse your hair and body thoroughly to remove the shampoo.  4. Use CHG as you would any other liquid soap. You can apply CHG directly to the skin and wash gently with a scrungie or a clean washcloth.   5. Apply the CHG Soap to your body ONLY FROM THE NECK DOWN.  Do not use on open wounds or open sores. Avoid contact with your eyes, ears, mouth and genitals (private parts). Wash Face and genitals (private parts)  with your normal soap.  6. Wash thoroughly, paying special attention to the area where your surgery will be performed.  7. Thoroughly rinse your body with warm water from the neck down.  8. DO NOT shower/wash with your normal soap after using and rinsing off the CHG Soap.  9. Pat yourself dry with a CLEAN TOWEL.  10. Wear CLEAN PAJAMAS to bed the night before surgery, wear comfortable clothes the  morning of surgery  11. Place CLEAN SHEETS on your bed the night of your first shower and DO NOT SLEEP WITH PETS.    Day of Surgery: Do not apply any deodorants/lotions. Please wear clean clothes to the hospital/surgery center.      Please read over the following fact sheets that you were given.

## 2017-08-10 ENCOUNTER — Ambulatory Visit
Admission: RE | Admit: 2017-08-10 | Discharge: 2017-08-10 | Disposition: A | Discharge status: Home / Self Care | Payer: Medicare Other | Source: Ambulatory Visit | Attending: General Surgery | Admitting: General Surgery

## 2017-08-10 DIAGNOSIS — R928 Other abnormal and inconclusive findings on diagnostic imaging of breast: Secondary | ICD-10-CM

## 2017-08-10 DIAGNOSIS — N6082 Other benign mammary dysplasias of left breast: Secondary | ICD-10-CM | POA: Diagnosis not present

## 2017-08-10 NOTE — Progress Notes (Addendum)
Anesthesia Chart Review:  Pt is an 83 year old female scheduled for L radioactive seed guided excisional breast biopsy on 08/11/2017 with Stark Klein, MD  - PCP is [name], NP - Oncologist is Curt Bears, MD - Cardiologist is Sanda Klein, MD. Last office visit 04/07/17; 1 year f/u recommended.  Pt cleared for surgery 08/05/17 by Fabian Sharp, PA  PMH includes:  Atrial fibrillation (I can find no evidence to support this diagnosis), sinus node dysfunction, pacemaker (Medtronic implanted 2013), CHF, pericarditis, pulmonary nodules, anemia, pulmonary nodules, scoliosis, non-Hodgkin's lymphoma, GERD. Never smoker. BMI 23. S/p R neck mass removal 10/28/12.   Anesthesia history: Pt reported to Dr. Barry Dienes "something happened" during surgery this past summer at [facility].  A review of records in care everywhere show no procedure summer 2018, but pt did have levator resection for ptosis 02/05/16 at [facility].  Review of those records do not show any concerning event occurring perioperatively. Pt received alfentanil 700 mcg Iand ondansetron 4mg  V for anesthesia for this procedure.   Medications include: diltiazem, lasix, hizentra, levothyroxine, prilosec, potassium  BP (!) 145/79   Pulse 81   Temp 36.8 C   Resp 18   Ht 5\' 3"  (1.6 m)   Wt 130 lb (59 kg)   SpO2 98%   BMI 23.03 kg/m    Preoperative labs reviewed.    CT chest, abd, pelvis 06/16/17:  1. Stable exam. No new or progressive findings. No lymphadenopathy in the chest, abdomen, or pelvis. 2. Stable appearance scattered calcified and noncalcified pulmonary nodules measuring up to 6 mm. These remain compatible with benign etiology. 3.  Aortic Atherosclerois   CXR 04/21/17:  - No evidence of acute cardiopulmonary disease - Large hiatal hernia.  EKG 04/07/17: Atrial-paced rhythm with prolonged AV conduction.  Echo 04/28/16:  - Left ventricle: The cavity size was normal. Systolic function was normal. The estimated ejection fraction was  in the range of 60% to 65%. Wall motion was normal; there were no regional wall motion abnormalities. Features are consistent with a pseudonormal left ventricular filling pattern, with concomitant abnormal relaxation and increased filling pressure (grade 2 diastolic dysfunction). Doppler parameters are consistent with high ventricular filling pressure. - Aortic valve: Trileaflet; normal thickness, mildly calcified leaflets. - Pulmonary arteries: PA peak pressure: 45 mm Hg (S). - Impressions: The right ventricular systolic pressure was increased consistent with moderate pulmonary hypertension.  If no changes, I anticipate pt can proceed with surgery as scheduled.   Willeen Cass, FNP-BC [facility] Short Stay Surgical Center/Anesthesiology Phone: [phone] 08/10/2017 1:58 PM

## 2017-08-11 ENCOUNTER — Ambulatory Visit (HOSPITAL_COMMUNITY)
Admission: RE | Admit: 2017-08-11 | Discharge: 2017-08-11 | Disposition: A | Discharge status: Home / Self Care | Payer: Medicare Other | Source: Ambulatory Visit | Attending: General Surgery | Admitting: General Surgery

## 2017-08-11 ENCOUNTER — Encounter (HOSPITAL_COMMUNITY): Payer: Self-pay | Admitting: *Deleted

## 2017-08-11 ENCOUNTER — Ambulatory Visit (HOSPITAL_COMMUNITY): Payer: Medicare Other | Admitting: Vascular Surgery

## 2017-08-11 ENCOUNTER — Ambulatory Visit (HOSPITAL_COMMUNITY): Payer: Medicare Other | Admitting: Emergency Medicine

## 2017-08-11 ENCOUNTER — Encounter (HOSPITAL_COMMUNITY)
Admission: RE | Disposition: A | Discharge status: Home / Self Care | Payer: Self-pay | Source: Ambulatory Visit | Attending: General Surgery

## 2017-08-11 ENCOUNTER — Ambulatory Visit
Admission: RE | Admit: 2017-08-11 | Discharge: 2017-08-11 | Disposition: A | Discharge status: Home / Self Care | Payer: Medicare Other | Source: Ambulatory Visit | Attending: General Surgery | Admitting: General Surgery

## 2017-08-11 DIAGNOSIS — I10 Essential (primary) hypertension: Secondary | ICD-10-CM | POA: Insufficient documentation

## 2017-08-11 DIAGNOSIS — Z79890 Hormone replacement therapy (postmenopausal): Secondary | ICD-10-CM | POA: Diagnosis not present

## 2017-08-11 DIAGNOSIS — Z79899 Other long term (current) drug therapy: Secondary | ICD-10-CM | POA: Diagnosis not present

## 2017-08-11 DIAGNOSIS — Z8572 Personal history of non-Hodgkin lymphomas: Secondary | ICD-10-CM | POA: Insufficient documentation

## 2017-08-11 DIAGNOSIS — Z7951 Long term (current) use of inhaled steroids: Secondary | ICD-10-CM | POA: Diagnosis not present

## 2017-08-11 DIAGNOSIS — N6022 Fibroadenosis of left breast: Secondary | ICD-10-CM | POA: Insufficient documentation

## 2017-08-11 DIAGNOSIS — N6012 Diffuse cystic mastopathy of left breast: Secondary | ICD-10-CM | POA: Diagnosis not present

## 2017-08-11 DIAGNOSIS — R928 Other abnormal and inconclusive findings on diagnostic imaging of breast: Secondary | ICD-10-CM

## 2017-08-11 DIAGNOSIS — Z885 Allergy status to narcotic agent status: Secondary | ICD-10-CM | POA: Insufficient documentation

## 2017-08-11 DIAGNOSIS — Z886 Allergy status to analgesic agent status: Secondary | ICD-10-CM | POA: Diagnosis not present

## 2017-08-11 DIAGNOSIS — E039 Hypothyroidism, unspecified: Secondary | ICD-10-CM | POA: Insufficient documentation

## 2017-08-11 DIAGNOSIS — Z880 Allergy status to penicillin: Secondary | ICD-10-CM | POA: Insufficient documentation

## 2017-08-11 DIAGNOSIS — E785 Hyperlipidemia, unspecified: Secondary | ICD-10-CM | POA: Diagnosis not present

## 2017-08-11 DIAGNOSIS — Z882 Allergy status to sulfonamides status: Secondary | ICD-10-CM | POA: Diagnosis not present

## 2017-08-11 DIAGNOSIS — Z8249 Family history of ischemic heart disease and other diseases of the circulatory system: Secondary | ICD-10-CM | POA: Insufficient documentation

## 2017-08-11 DIAGNOSIS — Z9889 Other specified postprocedural states: Secondary | ICD-10-CM | POA: Diagnosis not present

## 2017-08-11 DIAGNOSIS — R921 Mammographic calcification found on diagnostic imaging of breast: Secondary | ICD-10-CM | POA: Diagnosis not present

## 2017-08-11 DIAGNOSIS — K219 Gastro-esophageal reflux disease without esophagitis: Secondary | ICD-10-CM | POA: Diagnosis not present

## 2017-08-11 DIAGNOSIS — K449 Diaphragmatic hernia without obstruction or gangrene: Secondary | ICD-10-CM | POA: Diagnosis not present

## 2017-08-11 HISTORY — PX: RADIOACTIVE SEED GUIDED EXCISIONAL BREAST BIOPSY: SHX6490

## 2017-08-11 SURGERY — RADIOACTIVE SEED GUIDED BREAST BIOPSY
Anesthesia: General | Site: Breast | Laterality: Left

## 2017-08-11 MED ORDER — BUPIVACAINE-EPINEPHRINE (PF) 0.5% -1:200000 IJ SOLN
INTRAMUSCULAR | Status: AC
  Filled 2017-08-11: qty 30

## 2017-08-11 MED ORDER — GABAPENTIN 300 MG PO CAPS
300.0000 mg | ORAL_CAPSULE | ORAL | Status: AC
  Administered 2017-08-11: 300 mg via ORAL
  Filled 2017-08-11: qty 1

## 2017-08-11 MED ORDER — BUPIVACAINE-EPINEPHRINE 0.5% -1:200000 IJ SOLN
INTRAMUSCULAR | Status: DC | PRN
  Administered 2017-08-11: 30 mL

## 2017-08-11 MED ORDER — DEXAMETHASONE SODIUM PHOSPHATE 10 MG/ML IJ SOLN
INTRAMUSCULAR | Status: AC
  Filled 2017-08-11: qty 1

## 2017-08-11 MED ORDER — PROPOFOL 10 MG/ML IV BOLUS
INTRAVENOUS | Status: AC
  Filled 2017-08-11: qty 40

## 2017-08-11 MED ORDER — PROPOFOL 10 MG/ML IV BOLUS
INTRAVENOUS | Status: DC | PRN
  Administered 2017-08-11: 150 mg via INTRAVENOUS

## 2017-08-11 MED ORDER — FENTANYL CITRATE (PF) 250 MCG/5ML IJ SOLN
INTRAMUSCULAR | Status: AC
  Filled 2017-08-11: qty 5

## 2017-08-11 MED ORDER — 0.9 % SODIUM CHLORIDE (POUR BTL) OPTIME
TOPICAL | Status: DC | PRN
  Administered 2017-08-11: 1000 mL

## 2017-08-11 MED ORDER — LIDOCAINE 2% (20 MG/ML) 5 ML SYRINGE
INTRAMUSCULAR | Status: DC | PRN
  Administered 2017-08-11: 80 mg via INTRAVENOUS

## 2017-08-11 MED ORDER — TRAMADOL HCL 50 MG PO TABS: 50.0000 mg | ORAL_TABLET | Freq: Four times a day (QID) | ORAL | 0 refills | Status: DC | PRN

## 2017-08-11 MED ORDER — PHENYLEPHRINE HCL 10 MG/ML IJ SOLN
INTRAVENOUS | Status: DC | PRN
  Administered 2017-08-11: 25 ug/min via INTRAVENOUS

## 2017-08-11 MED ORDER — CHLORHEXIDINE GLUCONATE CLOTH 2 % EX PADS: 6.0000 | MEDICATED_PAD | Freq: Once | CUTANEOUS | Status: DC

## 2017-08-11 MED ORDER — ACETAMINOPHEN 500 MG PO TABS
1000.0000 mg | ORAL_TABLET | ORAL | Status: AC
  Administered 2017-08-11: 1000 mg via ORAL
  Filled 2017-08-11: qty 2

## 2017-08-11 MED ORDER — LACTATED RINGERS IV SOLN
INTRAVENOUS | Status: DC | PRN
  Administered 2017-08-11: 08:00:00 via INTRAVENOUS

## 2017-08-11 MED ORDER — DEXAMETHASONE SODIUM PHOSPHATE 10 MG/ML IJ SOLN
INTRAMUSCULAR | Status: DC | PRN
  Administered 2017-08-11: 5 mg via INTRAVENOUS

## 2017-08-11 MED ORDER — LIDOCAINE 2% (20 MG/ML) 5 ML SYRINGE
INTRAMUSCULAR | Status: AC
  Filled 2017-08-11: qty 5

## 2017-08-11 MED ORDER — FENTANYL CITRATE (PF) 100 MCG/2ML IJ SOLN
INTRAMUSCULAR | Status: DC | PRN
  Administered 2017-08-11: 25 ug via INTRAVENOUS
  Administered 2017-08-11: 50 ug via INTRAVENOUS

## 2017-08-11 MED ORDER — LIDOCAINE HCL (PF) 1 % IJ SOLN
INTRAMUSCULAR | Status: AC
  Filled 2017-08-11: qty 30

## 2017-08-11 MED ORDER — CIPROFLOXACIN IN D5W 400 MG/200ML IV SOLN
400.0000 mg | INTRAVENOUS | Status: AC
  Administered 2017-08-11: 400 mg via INTRAVENOUS
  Filled 2017-08-11: qty 200

## 2017-08-11 MED ORDER — FENTANYL CITRATE (PF) 100 MCG/2ML IJ SOLN: 25.0000 ug | INTRAMUSCULAR | Status: DC | PRN

## 2017-08-11 MED ORDER — ONDANSETRON HCL 4 MG/2ML IJ SOLN
INTRAMUSCULAR | Status: AC
  Filled 2017-08-11: qty 2

## 2017-08-11 MED ORDER — ONDANSETRON HCL 4 MG/2ML IJ SOLN
INTRAMUSCULAR | Status: DC | PRN
  Administered 2017-08-11: 4 mg via INTRAVENOUS

## 2017-08-11 SURGICAL SUPPLY — 53 items
APPLIER CLIP 9.375 MED OPEN (MISCELLANEOUS)
BINDER BREAST LRG (GAUZE/BANDAGES/DRESSINGS) ×3 IMPLANT
BINDER BREAST XLRG (GAUZE/BANDAGES/DRESSINGS) IMPLANT
BLADE SURG 10 STRL SS (BLADE) ×3 IMPLANT
CANISTER SUCT 3000ML PPV (MISCELLANEOUS) ×3 IMPLANT
CHLORAPREP W/TINT 26ML (MISCELLANEOUS) ×6 IMPLANT
CLIP APPLIE 9.375 MED OPEN (MISCELLANEOUS) IMPLANT
CLIP VESOCCLUDE LG 6/CT (CLIP) IMPLANT
CLOSURE WOUND 1/2 X4 (GAUZE/BANDAGES/DRESSINGS) ×1
COVER PROBE W GEL 5X96 (DRAPES) ×3 IMPLANT
COVER SURGICAL LIGHT HANDLE (MISCELLANEOUS) ×3 IMPLANT
DERMABOND ADVANCED (GAUZE/BANDAGES/DRESSINGS) ×2
DERMABOND ADVANCED .7 DNX12 (GAUZE/BANDAGES/DRESSINGS) ×1 IMPLANT
DEVICE DUBIN SPECIMEN MAMMOGRA (MISCELLANEOUS) ×3 IMPLANT
DRAPE CHEST BREAST 15X10 FENES (DRAPES) ×3 IMPLANT
DRAPE UTILITY XL STRL (DRAPES) ×3 IMPLANT
DRSG PAD ABDOMINAL 8X10 ST (GAUZE/BANDAGES/DRESSINGS) ×3 IMPLANT
ELECT CAUTERY BLADE 6.4 (BLADE) ×3 IMPLANT
ELECT REM PT RETURN 9FT ADLT (ELECTROSURGICAL) ×3
ELECTRODE REM PT RTRN 9FT ADLT (ELECTROSURGICAL) ×1 IMPLANT
GLOVE BIO SURGEON STRL SZ 6 (GLOVE) ×3 IMPLANT
GLOVE BIOGEL PI IND STRL 6 (GLOVE) ×1 IMPLANT
GLOVE BIOGEL PI IND STRL 8 (GLOVE) ×1 IMPLANT
GLOVE BIOGEL PI INDICATOR 6 (GLOVE) ×2
GLOVE BIOGEL PI INDICATOR 8 (GLOVE) ×2
GLOVE INDICATOR 6.5 STRL GRN (GLOVE) ×3 IMPLANT
GLOVE SURG SS PI 6.0 STRL IVOR (GLOVE) ×3 IMPLANT
GOWN STRL REUS W/ TWL LRG LVL3 (GOWN DISPOSABLE) ×1 IMPLANT
GOWN STRL REUS W/TWL 2XL LVL3 (GOWN DISPOSABLE) ×3 IMPLANT
GOWN STRL REUS W/TWL LRG LVL3 (GOWN DISPOSABLE) ×2
ILLUMINATOR WAVEGUIDE N/F (MISCELLANEOUS) IMPLANT
KIT BASIN OR (CUSTOM PROCEDURE TRAY) ×3 IMPLANT
KIT MARKER MARGIN INK (KITS) ×3 IMPLANT
LIGHT WAVEGUIDE WIDE FLAT (MISCELLANEOUS) IMPLANT
NEEDLE HYPO 25GX1X1/2 BEV (NEEDLE) ×3 IMPLANT
NS IRRIG 1000ML POUR BTL (IV SOLUTION) ×3 IMPLANT
PACK SURGICAL SETUP 50X90 (CUSTOM PROCEDURE TRAY) ×3 IMPLANT
PAD ABD 8X10 STRL (GAUZE/BANDAGES/DRESSINGS) ×3 IMPLANT
PENCIL BUTTON HOLSTER BLD 10FT (ELECTRODE) ×3 IMPLANT
SPONGE LAP 18X18 X RAY DECT (DISPOSABLE) ×3 IMPLANT
STRIP CLOSURE SKIN 1/2X4 (GAUZE/BANDAGES/DRESSINGS) ×2 IMPLANT
SUT MNCRL AB 4-0 PS2 18 (SUTURE) ×3 IMPLANT
SUT VIC AB 2-0 SH 27 (SUTURE) ×2
SUT VIC AB 2-0 SH 27XBRD (SUTURE) ×1 IMPLANT
SUT VIC AB 3-0 SH 27 (SUTURE) ×2
SUT VIC AB 3-0 SH 27X BRD (SUTURE) ×1 IMPLANT
SYR BULB 3OZ (MISCELLANEOUS) ×3 IMPLANT
SYR CONTROL 10ML LL (SYRINGE) ×3 IMPLANT
TOWEL OR 17X24 6PK STRL BLUE (TOWEL DISPOSABLE) ×3 IMPLANT
TOWEL OR 17X26 10 PK STRL BLUE (TOWEL DISPOSABLE) IMPLANT
TUBE CONNECTING 12'X1/4 (SUCTIONS) ×1
TUBE CONNECTING 12X1/4 (SUCTIONS) ×2 IMPLANT
YANKAUER SUCT BULB TIP NO VENT (SUCTIONS) ×3 IMPLANT

## 2017-08-11 NOTE — Discharge Instructions (Addendum)
[facility] °Office Phone Number [phone] ° °BREAST BIOPSY/ PARTIAL MASTECTOMY: POST OP INSTRUCTIONS ° °Always review your discharge instruction sheet given to you by the facility where your surgery was performed. ° °IF YOU HAVE DISABILITY OR FAMILY LEAVE FORMS, YOU MUST BRING THEM TO THE OFFICE FOR PROCESSING.  DO NOT GIVE THEM TO YOUR DOCTOR. ° °1. A prescription for pain medication may be given to you upon discharge.  Take your pain medication as prescribed, if needed.  If narcotic pain medicine is not needed, then you may take acetaminophen (Tylenol) or ibuprofen (Advil) as needed. °2. Take your usually prescribed medications unless otherwise directed °3. If you need a refill on your pain medication, please contact your pharmacy.  They will contact our office to request authorization.  Prescriptions will not be filled after 5pm or on week-ends. °4. You should eat very light the first 24 hours after surgery, such as soup, crackers, pudding, etc.  Resume your normal diet the day after surgery. °5. Most patients will experience some swelling and bruising in the breast.  Ice packs and a good support bra will help.  Swelling and bruising can take several days to resolve.  °6. It is common to experience some constipation if taking pain medication after surgery.  Increasing fluid intake and taking a stool softener will usually help or prevent this problem from occurring.  A mild laxative (Milk of Magnesia or Miralax) should be taken according to package directions if there are no bowel movements after 48 hours. °7. Unless discharge instructions indicate otherwise, you may remove your bandages 48 hours after surgery, and you may shower at that time.  You may have steri-strips (small skin tapes) in place directly over the incision.  These strips should be left on the skin for 7-10 days.   Any sutures or staples will be removed at the office during your follow-up visit. °8. ACTIVITIES:  You may resume  regular daily activities (gradually increasing) beginning the next day.  Wearing a good support bra or sports bra (or the breast binder) minimizes pain and swelling.  You may have sexual intercourse when it is comfortable. °a. You may drive when you no longer are taking prescription pain medication, you can comfortably wear a seatbelt, and you can safely maneuver your car and apply brakes. °b. RETURN TO WORK:  __________1 week_______________ °9. You should see your doctor in the office for a follow-up appointment approximately two weeks after your surgery.  Your doctor’s nurse will typically make your follow-up appointment when she calls you with your pathology report.  Expect your pathology report 2-3 business days after your surgery.  You may call to check if you do not hear from us after three days. ° ° °WHEN TO CALL YOUR DOCTOR: °1. Fever over 101.0 °2. Nausea and/or vomiting. °3. Extreme swelling or bruising. °4. Continued bleeding from incision. °5. Increased pain, redness, or drainage from the incision. ° °The clinic staff is available to answer your questions during regular business hours.  Please don’t hesitate to call and ask to speak to one of the nurses for clinical concerns.  If you have a medical emergency, go to the nearest emergency room or call 911.  A surgeon from [facility] is always on call at the hospital. ° °For further questions, please visit [email or web address]  ° °

## 2017-08-11 NOTE — Anesthesia Procedure Notes (Signed)
Procedure Name: LMA Insertion Date/Time: 08/11/2017 8:41 AM Performed by: Colin Benton, CRNA Pre-anesthesia Checklist: Patient identified, Suction available, Patient being monitored and Emergency Drugs available Patient Re-evaluated:Patient Re-evaluated prior to induction Oxygen Delivery Method: Circle system utilized Preoxygenation: Pre-oxygenation with 100% oxygen Induction Type: IV induction Ventilation: Mask ventilation without difficulty LMA: LMA inserted LMA Size: 4.0 Number of attempts: 1 Placement Confirmation: positive ETCO2 and breath sounds checked- equal and bilateral Tube secured with: Tape Dental Injury: Teeth and Oropharynx as per pre-operative assessment

## 2017-08-11 NOTE — Anesthesia Postprocedure Evaluation (Signed)
Anesthesia Post Note  Patient: [name]  Procedure(s) Performed: LEFT RADIOACTIVE SEED GUIDED EXCISIONAL BREAST BIOPSY ERAS PATHWAY (Left Breast)     Patient location during evaluation: PACU Anesthesia Type: General Level of consciousness: awake Pain management: pain level controlled Vital Signs Assessment: post-procedure vital signs reviewed and stable Respiratory status: spontaneous breathing Cardiovascular status: stable Anesthetic complications: no    Last Vitals:  Vitals:   08/11/17 0952 08/11/17 1015  BP:    Pulse: 75 60  Resp: 14 19  Temp: (!) 36.3 C   SpO2: 97% 96%    Last Pain:  Vitals:   08/11/17 1015  TempSrc:   PainSc: 0-No pain                 Kimmora Risenhoover

## 2017-08-11 NOTE — Anesthesia Preprocedure Evaluation (Signed)
Anesthesia Evaluation  Patient identified by MRN, date of birth, ID band Patient awake    Reviewed: Allergy & Precautions, Patient's Chart, lab work & pertinent test results  Airway Mallampati: II       Dental   Pulmonary    breath sounds clear to auscultation       Cardiovascular hypertension, + dysrhythmias  Rhythm:Regular Rate:Normal     Neuro/Psych    GI/Hepatic hiatal hernia, GERD  ,  Endo/Other  Hypothyroidism   Renal/GU      Musculoskeletal   Abdominal   Peds  Hematology   Anesthesia Other Findings   Reproductive/Obstetrics                             Anesthesia Physical Anesthesia Plan  ASA: III  Anesthesia Plan: General   Post-op Pain Management:    Induction: Intravenous  PONV Risk Score and Plan: 3 and Treatment may vary due to age or medical condition, Ondansetron, Dexamethasone and Midazolam  Airway Management Planned:   Additional Equipment:   Intra-op Plan:   Post-operative Plan: Extubation in OR  Informed Consent: I have reviewed the patients History and Physical, chart, labs and discussed the procedure including the risks, benefits and alternatives for the proposed anesthesia with the patient or authorized representative who has indicated his/her understanding and acceptance.   Dental advisory given  Plan Discussed with: CRNA and Anesthesiologist  Anesthesia Plan Comments:         Anesthesia Quick Evaluation

## 2017-08-11 NOTE — Transfer of Care (Signed)
Immediate Anesthesia Transfer of Care Note  Patient: [name]  Procedure(s) Performed: LEFT RADIOACTIVE SEED GUIDED EXCISIONAL BREAST BIOPSY ERAS PATHWAY (Left Breast)  Patient Location: PACU  Anesthesia Type:General  Level of Consciousness: drowsy and patient cooperative  Airway & Oxygen Therapy: Patient Spontanous Breathing and Patient connected to nasal cannula oxygen  Post-op Assessment: Report given to RN, Post -op Vital signs reviewed and stable and Patient moving all extremities X 4  Post vital signs: Reviewed and stable  Last Vitals:  Vitals:   08/11/17 0713  BP: 128/71  Pulse: 65  Resp: 18  Temp: 36.5 C  SpO2: 99%    Last Pain:  Vitals:   08/11/17 0713  TempSrc: Oral      Patients Stated Pain Goal: 5 (68/61/68 [id number])  Complications: No apparent anesthesia complications

## 2017-08-11 NOTE — Op Note (Signed)
Left Breast Radioactive seed localized excisional biopsy  Indications: This patient presents with history of abnormal left mammogram and discordant core needle biopsy  Pre-operative Diagnosis: abnormal left mammogram  Post-operative Diagnosis: abnormal left mammogram  Surgeon: Stark Klein   Anesthesia: General endotracheal anesthesia  ASA Class: 3  Procedure Details  The patient was seen in the Holding Room. The risks, benefits, complications, treatment options, and expected outcomes were discussed with the patient. The possibilities of bleeding, infection, the need for additional procedures, failure to diagnose a condition, and creating a complication requiring transfusion or operation were discussed with the patient. The patient concurred with the proposed plan, giving informed consent.  The site of surgery properly noted/marked. The patient was taken to Operating Room # 9, identified, and the procedure verified as left Breast seed localized excisional biopsy. A Time Out was held and the above information confirmed.  The left breast and chest were prepped and draped in standard fashion. The biopsy was performed by creating a transverse incision near the previously placed radioactive seed.  Dissection was carried down to around the point of maximum signal intensity. The cautery was used to perform the dissection.  Hemostasis was achieved with cautery.    The specimen was inked with the margin marker paint kit.    Specimen radiography confirmed inclusion of the mammographic lesion, the clip, and the seed.  The background signal in the breast was zero.  The wound was irrigated and closed with 3-0 vicryl in layers and 4-0 monocryl subcuticular suture.      Sterile dressings were applied. At the end of the operation, all sponge, instrument, and needle counts were correct.  Findings: grossly clear surgical margins and no adenopathy.  Pectoralis muscle is posterior margin  Estimated Blood Loss:   min         Specimens: left breast tissue with seed.           Complications:  None; patient tolerated the procedure well.         Disposition: PACU - hemodynamically stable.         Condition: stable ;

## 2017-08-11 NOTE — Interval H&P Note (Signed)
History and Physical Interval Note:  08/11/2017 8:17 AM  [name]  has presented today for surgery, with the diagnosis of abnormal left mammogram  The various methods of treatment have been discussed with the patient and family. After consideration of risks, benefits and other options for treatment, the patient has consented to  Procedure(s): LEFT RADIOACTIVE SEED GUIDED EXCISIONAL BREAST BIOPSY ERAS PATHWAY (Left) as a surgical intervention .  The patient's history has been reviewed, patient examined, no change in status, stable for surgery.  I have reviewed the patient's chart and labs.  Questions were answered to the patient's satisfaction.     Stark Klein

## 2017-08-11 NOTE — Progress Notes (Signed)
Pt daughter voices concern about Cipro being ordered and pt has allergy to sulfa drugs. Spoke with Hedy Camara in pharmacy and he states ok to give Cipro.

## 2017-08-12 ENCOUNTER — Encounter (HOSPITAL_COMMUNITY): Payer: Self-pay | Admitting: General Surgery

## 2017-09-07 ENCOUNTER — Other Ambulatory Visit: Payer: Self-pay | Admitting: Family

## 2017-09-08 NOTE — Telephone Encounter (Signed)
Last alprazolam RX: 07/30/17, #30 Last OV: 05/27/17, acute Next OV: none scheduled UDS: 05/11/17, moderate CSC: 09/15/16 CSR: No discrepancies identified.

## 2017-09-14 ENCOUNTER — Ambulatory Visit (INDEPENDENT_AMBULATORY_CARE_PROVIDER_SITE_OTHER): Payer: Medicare Other | Admitting: *Deleted

## 2017-09-14 ENCOUNTER — Telehealth: Payer: Self-pay | Admitting: Cardiology

## 2017-09-14 DIAGNOSIS — I495 Sick sinus syndrome: Secondary | ICD-10-CM

## 2017-09-14 NOTE — Telephone Encounter (Signed)
Spoke with pt and reminded pt of remote transmission that is due today. Pt verbalized understanding.   

## 2017-09-17 ENCOUNTER — Encounter: Payer: Self-pay | Admitting: Cardiology

## 2017-09-17 NOTE — Progress Notes (Signed)
Remote pacemaker transmission.   

## 2017-10-01 LAB — CUP PACEART REMOTE DEVICE CHECK
Battery Voltage: 2.96 V
Brady Statistic RA Percent Paced: 84.77 %
Implantable Lead Implant Date: 20130319
Lead Channel Impedance Value: 496 Ohm
Lead Channel Sensing Intrinsic Amplitude: 13.986 mV
Lead Channel Setting Pacing Amplitude: 2 V
MDC IDC LEAD IMPLANT DT: 20130319
MDC IDC LEAD LOCATION: 753859
MDC IDC LEAD LOCATION: 753860
MDC IDC MSMT LEADCHNL RA IMPEDANCE VALUE: 400 Ohm
MDC IDC MSMT LEADCHNL RA SENSING INTR AMPL: 1.907 mV
MDC IDC PG IMPLANT DT: 20130319
MDC IDC SESS DTM: 20190321160519
MDC IDC SET LEADCHNL RV SENSING SENSITIVITY: 0.9 mV

## 2017-10-04 ENCOUNTER — Telehealth: Payer: Self-pay | Admitting: *Deleted

## 2017-10-04 MED ORDER — LEVOTHYROXINE SODIUM 100 MCG PO TABS: ORAL_TABLET | ORAL | 0 refills | Status: DC

## 2017-10-04 NOTE — Telephone Encounter (Signed)
Received request from [facility] for levothyroxine 172mcg, #90. Refill sent. Pt due for 6 month follow up of thyroid. Please call pt to schedule appt with Melissa.  Thanks!

## 2017-10-06 ENCOUNTER — Telehealth: Payer: Self-pay | Admitting: Cardiovascular Disease

## 2017-10-06 NOTE — Telephone Encounter (Signed)
Agree with advice MCr 

## 2017-10-06 NOTE — Telephone Encounter (Signed)
New Message    Pt c/o swelling: STAT is pt has developed SOB within 24 hours  1) How much weight have you gained and in what time span? Unknown   2) If swelling, where is the swelling located? Feet are swelling   3) Are you currently taking a fluid pill?  lasix  4) Are you currently SOB?   5) Do you have a log of your daily weights (if so, list)? none  6) Have you gained 3 pounds in a day or 5 pounds in a week? Unknown   7) Have you traveled recently? No    Patient states that her feet are swelling. She wants to know can she take additional lasix. Please call to discuss.

## 2017-10-06 NOTE — Telephone Encounter (Signed)
Returned call to patient.She stated she has had swelling in both feet for the past 3 days.No sob.Has not weighed.Low salt diet.Advised she can increase Lasix to 20 mg twice a day for 3 days then return to normal dose 20 mg daily.Advised to elevate feet while sitting.Advised to call back if swelling continues.

## 2017-10-11 ENCOUNTER — Telehealth: Payer: Self-pay | Admitting: Cardiovascular Disease

## 2017-10-11 NOTE — Telephone Encounter (Signed)
Is she still getting the immunoglobulin infusion (Hizentra)? This is a lot of volume and may have something to the swelling, although it is only once every 6 weeks. Please increase furosemide to 40 mg twice a day (morning and early afternoon).  But also make her an appointment to come into the office to see [place].  It will have to be with an APP. MCr

## 2017-10-11 NOTE — Telephone Encounter (Signed)
F/U call from 10-06-17   Patient states that the lasix does not make a difference with the swelling.   See encounter from 10-06-17

## 2017-10-11 NOTE — Telephone Encounter (Signed)
Returned call to patient. She reports increased lasix dose of 20mg  BID did not help her swelling - see phone note 4/10. She tries to monitor her salt/sodium intake. She tries to elevate her legs. She has some occasional shortness of breath over the weekend but "not that much"   Her swelling is "hanging over her shoe"  Her weight is stable around 130lbs  Should patient continue on increased lasix dose of 20mg  BID for additional few days?

## 2017-10-11 NOTE — Telephone Encounter (Signed)
LMTCB

## 2017-10-12 NOTE — Telephone Encounter (Signed)
LMTCB

## 2017-10-12 NOTE — Telephone Encounter (Signed)
Patient called with MD recommendations of diuretic dose increase (med list updated). Offered her an MD OV on 4/18 or 4/19 as there were openings which she declined. She states she will be out of town from 4/18 - 4/23. She is limited as to when she can come in for an appointment and prefers no Mon or Fri appointments however can make an appointment on 4/22 if there is an opening/cancellation. Advised her that I would have a scheduler call her for an APP appointment.

## 2017-10-12 NOTE — Telephone Encounter (Signed)
Follow up  ° ° °Patient is returning call.  °

## 2017-10-12 NOTE — Telephone Encounter (Signed)
Left message for pt to call.

## 2017-10-14 ENCOUNTER — Telehealth: Payer: Self-pay | Admitting: *Deleted

## 2017-10-14 ENCOUNTER — Ambulatory Visit: Payer: Medicare Other | Admitting: Cardiovascular Disease

## 2017-10-14 VITALS — BP 116/70 | HR 73 | Ht 63.0 in | Wt 130.0 lb

## 2017-10-14 DIAGNOSIS — I5033 Acute on chronic diastolic (congestive) heart failure: Secondary | ICD-10-CM | POA: Diagnosis not present

## 2017-10-14 DIAGNOSIS — Z950 Presence of cardiac pacemaker: Secondary | ICD-10-CM

## 2017-10-14 DIAGNOSIS — Z8679 Personal history of other diseases of the circulatory system: Secondary | ICD-10-CM

## 2017-10-14 DIAGNOSIS — I495 Sick sinus syndrome: Secondary | ICD-10-CM | POA: Diagnosis not present

## 2017-10-14 NOTE — Patient Instructions (Signed)
Dr Sallyanne Kuster has recommended making the following medication changes: 1. STOP Diltiazem  Your physician has requested that you regularly monitor your blood pressure at home. Please use the same machine to check your blood pressure daily. Keep a record of your blood pressures using the log sheet provided. In 2 weeks, please report your readings back to Dr C. You may use our online patient portal 'MyChart' or you can call the office to speak with a nurse. Please let us know about your swelling at this time as well.  Dr Sallyanne Kuster recommends that you schedule a follow-up appointment in 3 months with a pacemaker check.  If you need a refill on your cardiac medications before your next appointment, please call your pharmacy.

## 2017-10-14 NOTE — Telephone Encounter (Signed)
Received fax from [facility] requesting refill of alprazolam. Please advise in PCP's absence.  Last alprazolam RX: 09/09/17, #30 x no refills. Last OV:  05/11/17 Next OV:  None scheduled UDS: 05/11/17, moderate risk CSC: past due CSR: No discrepancies identified
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
## 2017-10-15 ENCOUNTER — Encounter: Payer: Self-pay | Admitting: Cardiovascular Disease

## 2017-10-17 MED ORDER — ALPRAZOLAM 0.5 MG PO TABS: ORAL_TABLET | ORAL | 0 refills | Status: DC

## 2017-10-17 NOTE — Telephone Encounter (Signed)
I saw refill request on Sunday and refilled limited supply only 2 weeks. Based on risk an no contract did not want to rx further. Would you pass message to [facility] see how she will handle going forward.

## 2017-10-18 NOTE — Telephone Encounter (Signed)
She is overdue for follow up. Please schedule OV and we can take care of her contract, UDS and additional refills.

## 2017-10-18 NOTE — Telephone Encounter (Signed)
Left detailed message on home # to call and schedule appointment with PCP to discuss additional refills.

## 2017-10-19 ENCOUNTER — Other Ambulatory Visit: Payer: Self-pay | Admitting: Cardiovascular Disease

## 2017-10-26 ENCOUNTER — Ambulatory Visit: Payer: Medicare Other | Admitting: Family

## 2017-10-26 ENCOUNTER — Encounter: Payer: Self-pay | Admitting: Family

## 2017-10-26 VITALS — BP 130/80 | HR 68 | Temp 98.1°F | Resp 16 | Ht 62.0 in | Wt 132.0 lb

## 2017-10-26 DIAGNOSIS — I10 Essential (primary) hypertension: Secondary | ICD-10-CM | POA: Diagnosis not present

## 2017-10-26 DIAGNOSIS — E785 Hyperlipidemia, unspecified: Secondary | ICD-10-CM

## 2017-10-26 DIAGNOSIS — Z79899 Other long term (current) drug therapy: Secondary | ICD-10-CM | POA: Diagnosis not present

## 2017-10-26 DIAGNOSIS — F419 Anxiety disorder, unspecified: Secondary | ICD-10-CM

## 2017-10-26 DIAGNOSIS — E876 Hypokalemia: Secondary | ICD-10-CM

## 2017-10-26 DIAGNOSIS — G4700 Insomnia, unspecified: Secondary | ICD-10-CM

## 2017-10-26 DIAGNOSIS — R609 Edema, unspecified: Secondary | ICD-10-CM | POA: Diagnosis not present

## 2017-10-26 DIAGNOSIS — E039 Hypothyroidism, unspecified: Secondary | ICD-10-CM

## 2017-10-26 DIAGNOSIS — K219 Gastro-esophageal reflux disease without esophagitis: Secondary | ICD-10-CM

## 2017-10-26 LAB — LIPID PANEL
CHOLESTEROL: 232 mg/dL — AB (ref 0–200)
HDL: 46.6 mg/dL (ref >39.00)
LDL Cholesterol: 147 mg/dL — ABNORMAL HIGH (ref 0–99)
NonHDL: 185.81
TRIGLYCERIDES: 196 mg/dL — AB (ref 0.0–149.0)
Total CHOL/HDL Ratio: 5
VLDL: 39.2 mg/dL (ref 0.0–40.0)

## 2017-10-26 LAB — MAGNESIUM: Magnesium: 2.1 mg/dL (ref 1.5–2.5)

## 2017-10-26 LAB — BASIC METABOLIC PANEL
BUN: 13 mg/dL (ref 6–23)
CHLORIDE: 102 meq/L (ref 96–112)
CO2: 30 mEq/L (ref 19–32)
Calcium: 9.9 mg/dL (ref 8.4–10.5)
Creatinine, Ser: 0.74 mg/dL (ref 0.40–1.20)
GFR: 80 mL/min (ref >60.00)
GLUCOSE: 82 mg/dL (ref 70–99)
POTASSIUM: 3.9 meq/L (ref 3.5–5.1)
Sodium: 142 mEq/L (ref 135–145)

## 2017-10-26 LAB — TSH: TSH: 0.69 u[IU]/mL (ref 0.35–4.50)

## 2017-10-26 MED ORDER — OMEPRAZOLE 40 MG PO CPDR: 40.0000 mg | DELAYED_RELEASE_CAPSULE | Freq: Every day | ORAL | 5 refills | Status: DC | PRN

## 2017-10-26 NOTE — Patient Instructions (Signed)
Please complete lab work prior to leaving.   

## 2017-10-26 NOTE — Progress Notes (Signed)
Subjective:    Patient ID: [name], female    DOB: 08-14-1934, 83 y.o.   MRN: [id number]  HPI  Patient is an 83 yr old female who presents today for routine follow up.  Hypothyroid- maintained on synthroid 173mcg.   Lab Results  Component Value Date   TSH 0.63 04/14/2017   GERD- continues omeprazole 40mg . Reports that her symptoms are well controlled.  Ran out for 4-5 days   Lab Results  Component Value Date   CHOL 238 (H) 10/06/2012   HDL 53 10/06/2012   LDLCALC 145 (H) 10/06/2012   TRIG 199 (H) 10/06/2012   CHOLHDL 4.5 10/06/2012    Insomnia- continues alprazolam prn. Does not use every night.  Takes on occasion a partial tab during the day.    C/o LE swelling.  Diltiazem was stopped by cardiology on 4/18.  She thinks that her swelling has improved but still has some PM swelling. She continues lasix. Taking furosemide 40mg  bid. She denies sob.   BP Readings from Last 3 Encounters:  10/26/17 130/80  10/14/17 116/70  08/11/17 122/76    Review of Systems See HPI  Past Medical History:  Diagnosis Date  . Anemia    pt denies  . Arthritis   . Atrial fibrillation ([place])   . Cataract    bil  . Cystocele    history with rectocele, pt denies  . Dysrhythmia   . GERD (gastroesophageal reflux disease)   . H/O hiatal hernia   . Hemorrhoid   . History of pancreatitis    pt denies and denies as of today 08/09/2017  . Hypothyroidism   . Non Hodgkin's lymphoma ([place])    dx 2012 and now in remission  . Osteoporosis   . Pacemaker 09/15/2011   Medtronic Revo  . Pericarditis   . Pleural effusion, bilateral    history of  . Pulmonary nodules    history of  . Scoliosis   . Varicose vein      Social History   Socioeconomic History  . Marital status: Widowed    Spouse name: Not on file  . Number of children: 3  . Years of education: Not on file  . Highest education level: Not on file  Occupational History  . Occupation: self employed  Social Needs  .  Financial resource strain: Not on file  . Food insecurity:    Worry: Not on file    Inability: Not on file  . Transportation needs:    Medical: Not on file    Non-medical: Not on file  Tobacco Use  . Smoking status: Never Smoker  . Smokeless tobacco: Never Used  Substance and Sexual Activity  . Alcohol use: Yes    Comment: 1 glass of wine occasionally  . Drug use: No  . Sexual activity: Not on file  Lifestyle  . Physical activity:    Days per week: Not on file    Minutes per session: Not on file  . Stress: Not on file  Relationships  . Social connections:    Talks on phone: Not on file    Gets together: Not on file    Attends religious service: Not on file    Active member of club or organization: Not on file    Attends meetings of clubs or organizations: Not on file    Relationship status: Not on file  . Intimate partner violence:    Fear of current or ex partner: Not on file  Emotionally abused: Not on file    Physically abused: Not on file    Forced sexual activity: Not on file  Other Topics Concern  . 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  She lives alone in a 3-story home.  There is a lift chair which she does not use (placed for her husband).    Past Surgical History:  Procedure Laterality Date  . ABDOMINAL HYSTERECTOMY  04/29/04   partial  . BREAST SURGERY  07/30/01   biopsy x 3, bilateral  . COLONOSCOPY    . EYE SURGERY     cataracts  . INSERT / REPLACE / REMOVE PACEMAKER     2013  . MASS EXCISION Right 10/28/2012   Procedure: Removal of mass on neck       ;  Surgeon: Adin Hector, MD;  Location: [facility];  Service: General;  Laterality: Right;  . PACEMAKER INSERTION  09/15/11   MDT  . PERMANENT PACEMAKER INSERTION N/A 09/15/2011   Procedure: PERMANENT PACEMAKER INSERTION;  Surgeon: Sanda Klein, MD;  Location: [facility];  Service: Cardiovascular;  Laterality:  N/A;  . RADIOACTIVE SEED GUIDED EXCISIONAL BREAST BIOPSY Left 08/11/2017   Procedure: LEFT RADIOACTIVE SEED GUIDED EXCISIONAL BREAST BIOPSY ERAS PATHWAY;  Surgeon: Stark Klein, MD;  Location: [facility];  Service: General;  Laterality: Left;  . TONSILLECTOMY      Family History  Problem Relation Age of Onset  . Stroke Mother   . Stroke Father   . Heart disease Daughter        congenital "whole in her heart"  . Lymphoma Daughter        nonhodgkins  . Breast cancer Sister   . Heart disease Brother     Allergies  Allergen Reactions  . Amoxicillin Hives    Has patient had a PCN reaction causing immediate rash, facial/tongue/throat swelling, SOB or lightheadedness with hypotension: No Has patient had a PCN reaction causing severe rash involving mucus membranes or skin necrosis: No Has patient had a PCN reaction that required hospitalization: No Has patient had a PCN reaction occurring within the last 10 years: No If all of the above answers are "NO", then may proceed with Cephalosporin use.   . Buprenorphine Hives  . Codeine Hives    REACTION: "makes her crazy" Hives  . Demerol Hives  . Morphine And Related Hives  . Sulfonamide Derivatives Rash    REACTION: Rash  . Vicodin [Hydrocodone-Acetaminophen] Hives    Current Outpatient Medications on File Prior to Visit  Medication Sig Dispense Refill  . acetaminophen (TYLENOL) 500 MG tablet Take 500 mg by mouth every 6 (six) hours as needed (for pain.).     [name] ALPRAZolam (XANAX) 0.5 MG tablet take 1 tablet by mouth at bedtime if needed 14 tablet 0  . Calcium Carbonate-Vitamin D (CALTRATE 600+D) 600-400 MG-UNIT per tablet Take 1 tablet by mouth 2 (two) times daily.    [name] denosumab (PROLIA) 60 MG/ML SOLN injection Inject 60 mg into the skin every 6 (six) months. Reported on 10/16/2015    . furosemide (LASIX) 20 MG tablet TAKE 1 TABLET BY MOUTH ONCE DAILY 90 tablet 3  . gabapentin (NEURONTIN) 300 MG capsule Take 1 capsule (300 mg total) by  mouth 3 (three) times daily. (Patient taking differently: Take 300 mg by mouth 3 (three) times daily as needed (for nerve pain.). ) 90 capsule 3  . HIZENTRA  4 GM/20ML SOLN Inject 4 g into the skin every Wednesday.     [name] ibuprofen (ADVIL,MOTRIN) 200 MG tablet Take 200-400 mg by mouth every 6 (six) hours as needed (for pain.).    [name] levothyroxine (SYNTHROID, LEVOTHROID) 100 MCG tablet TAKE 1 TABLET (100 MCG) BY MOUTH ONCE DAILY IN THE MORNING. 90 tablet 0  . loratadine (CLARITIN) 10 MG tablet Take 10 mg by mouth daily.    . Magnesium Oxide 400 (240 Mg) MG TABS Take 1 tablet (400 mg total) by mouth daily. 30 tablet   . omeprazole (PRILOSEC) 40 MG capsule take 1 capsule by mouth once daily (Patient taking differently: TAKE 1 CAPSULE (40 MG) BY MOUTH DAILY IN THE MORNING.) 30 capsule 5  . potassium chloride (K-DUR) 10 MEQ tablet take 1 tablet by mouth once daily (Patient taking differently: TAKE 1 TABLET (10 MEQ) BY MOUTH ONCE DAILY IN THE MORNING.) 90 tablet 3  . traMADol (ULTRAM) 50 MG tablet Take 1 tablet (50 mg total) by mouth every 6 (six) hours as needed (FOR PAIN.). 30 tablet 0   No current facility-administered medications on file prior to visit.     BP 130/80 (BP Location: Right Arm, Patient Position: Sitting, Cuff Size: Small)   Pulse 68   Temp 98.1 F (36.7 C) (Oral)   Resp 16   Ht 5\' 2"  (1.575 m)   Wt 132 lb (59.9 kg)   SpO2 98%   BMI 24.14 kg/m       Objective:   Physical Exam  Constitutional: She is oriented to person, place, and time. She appears well-developed and well-nourished.  Cardiovascular: Normal rate, regular rhythm and normal heart sounds.  No murmur heard. 1+ bilateral LE edema  Pulmonary/Chest: Effort normal and breath sounds normal. No respiratory distress. She has no wheezes.  Neurological: She is alert and oriented to person, place, and time.  Psychiatric: She has a normal mood and affect. Her behavior is normal. Judgment and thought content normal.           Assessment & Plan:  Hypothyroid- clinically stable. Obtain TSH. Continue synthroid.  HTN- bp stable today in office.  Suspect that her lower extremity edema is due to venous insufficiency. She declines support hose. Requesting follow up magnesium level.  GERD- was off ppi for 4-5 days. Only had one episode of gerd which resolved with use of tums. Will change PPI to prn and possibly discontinue next visit if stable.  Insomnia/anxiety- controlled substance contract is signed. Obtain UDS.    Hyperlipidemia- check lipid panel.

## 2017-10-28 ENCOUNTER — Telehealth: Payer: Self-pay | Admitting: Cardiovascular Disease

## 2017-10-28 NOTE — Telephone Encounter (Signed)
New Message:      Pt c/o BP issue: STAT if pt c/o blurred vision, one-sided weakness or slurred speech  1. What are your last 5 BP readings? 4/18-5/2 10/14/17:116/70 111/74 118/77 124/84 146/96 140/86 140/96 150/86 140/96 147/99 148/99 142/99 152/91 148/101 152/90 148/91 146/90 152/101 148/99 150/103 146/96 155/101 130/80 125/78 146/105 158/108:10/27/17 couple hours later 166/106 5/2:136/96  2. Are you having any other symptoms (ex. Dizziness, headache, blurred vision, passed out)?   3. What is your BP issue?    Swelling is better without the medication. Pt states she has faxed over her last bp results from 4/18-5/2.

## 2017-10-28 NOTE — Telephone Encounter (Signed)
Returned call to patient she was calling to report B/P has been elevated.Readings listed below.Pulse ranging 60 to 80.Advised Dr.Croitoru out of office.I will send message to him for advice.

## 2017-10-29 LAB — PAIN MGMT, PROFILE 8 W/CONF, U
6 Acetylmorphine: NEGATIVE ng/mL (ref <10)
ALCOHOL METABOLITES: NEGATIVE ng/mL (ref <500)
ALPHAHYDROXYTRIAZOLAM: NEGATIVE ng/mL (ref <50)
AMPHETAMINES: NEGATIVE ng/mL (ref <500)
Alphahydroxyalprazolam: 52 ng/mL — ABNORMAL HIGH (ref <25)
Alphahydroxymidazolam: NEGATIVE ng/mL (ref <50)
Aminoclonazepam: NEGATIVE ng/mL (ref <25)
Benzodiazepines: POSITIVE ng/mL — AB (ref <100)
Buprenorphine, Urine: NEGATIVE ng/mL (ref <5)
COCAINE METABOLITE: NEGATIVE ng/mL (ref <150)
Creatinine: 24.9 mg/dL
HYDROXYETHYLFLURAZEPAM: NEGATIVE ng/mL (ref <50)
Lorazepam: NEGATIVE ng/mL (ref <50)
MARIJUANA METABOLITE: NEGATIVE ng/mL (ref <20)
MDMA: NEGATIVE ng/mL (ref <500)
Nordiazepam: NEGATIVE ng/mL (ref <50)
OXAZEPAM: NEGATIVE ng/mL (ref <50)
OXIDANT: NEGATIVE ug/mL (ref <200)
OXYCODONE: NEGATIVE ng/mL (ref <100)
Opiates: NEGATIVE ng/mL (ref <100)
TEMAZEPAM: NEGATIVE ng/mL (ref <50)
pH: 6.85 (ref 4.5–9.0)

## 2017-10-29 NOTE — Telephone Encounter (Signed)
We will try a different BP medication that does not cause swelling. Please Rx lisinopril 10 mg daily and send [place] BP recordings in 2 weeks, Thanks MCr

## 2017-11-01 NOTE — Telephone Encounter (Signed)
Follow up    Patient is calling to see what Dr. Sallyanne Kuster is recommending that will help with her swelling and BP. Please call to discuss.

## 2017-11-01 NOTE — Telephone Encounter (Signed)
Left a message to call back.

## 2017-11-02 ENCOUNTER — Other Ambulatory Visit: Payer: Self-pay

## 2017-11-02 MED ORDER — LISINOPRIL 10 MG PO TABS: 10.0000 mg | ORAL_TABLET | Freq: Every day | ORAL | 3 refills | Status: DC

## 2017-11-02 NOTE — Telephone Encounter (Signed)
Spoke with Pt and advised od Dr. Victorino December recommendation. Pt verbalized understanding. New prescription for Lisinopril 10 mg was sent to pharmacy.

## 2017-11-02 NOTE — Telephone Encounter (Signed)
Follow Up: ° ° ° °Returning a call from yesterday. °

## 2017-11-11 ENCOUNTER — Other Ambulatory Visit: Payer: Self-pay | Admitting: Family

## 2017-11-14 ENCOUNTER — Other Ambulatory Visit: Payer: Self-pay | Admitting: Medical

## 2017-11-16 NOTE — Telephone Encounter (Signed)
Last alprazolam RX: 10/17/17, #14 by covering Provider in PCP absence. Last OV: 10/26/17 Next OV:due 04/27/18 UDS: 10/26/17, moderate CSC: 10/26/17 CSR: No discrepancies identified

## 2017-11-17 ENCOUNTER — Telehealth: Payer: Self-pay | Admitting: Cardiovascular Disease

## 2017-11-17 NOTE — Telephone Encounter (Signed)
New Message   Pt states she sent a fax with her blood pressure readings and wants to make sure the nurse received them. Please call

## 2017-11-17 NOTE — Telephone Encounter (Signed)
Recevied patient BP readings.  BP ranges 119-165/74-112; HR - 64-97  Per MCr - Please increase Furosemide to 40 mg daily and call back with BP and weight on Tuesday/Wednesday of next week. Try to limit use of ibuprofen or other NSAIDs.  Returned call to patient. Patient reports increased fatigue. She goes to bed because she is tired but she cannot sleep. States that she is experiencing more palpitations at night. When advised of MD recommendations, patient stated that she is currently taking Lasix 40 mg BID.  Please advise.

## 2017-11-18 NOTE — Telephone Encounter (Signed)
Can we please increase lisinopril to 20 mg daily and bring her in to the office next week.

## 2017-11-24 NOTE — Telephone Encounter (Signed)
Follow up    Pt states she faxed over her blood pressure and weight readings today and wants to make sure they were received. Please call

## 2017-11-26 ENCOUNTER — Encounter: Payer: Self-pay | Admitting: Physician Assistant

## 2017-11-26 ENCOUNTER — Ambulatory Visit: Payer: Medicare Other | Admitting: Physician Assistant

## 2017-11-26 VITALS — BP 122/82 | HR 76 | Ht 64.0 in | Wt 131.8 lb

## 2017-11-26 DIAGNOSIS — I10 Essential (primary) hypertension: Secondary | ICD-10-CM | POA: Diagnosis not present

## 2017-11-26 DIAGNOSIS — Z79899 Other long term (current) drug therapy: Secondary | ICD-10-CM | POA: Diagnosis not present

## 2017-11-26 DIAGNOSIS — R002 Palpitations: Secondary | ICD-10-CM

## 2017-11-26 DIAGNOSIS — I5032 Chronic diastolic (congestive) heart failure: Secondary | ICD-10-CM | POA: Diagnosis not present

## 2017-11-26 DIAGNOSIS — I495 Sick sinus syndrome: Secondary | ICD-10-CM

## 2017-11-26 MED ORDER — LISINOPRIL 20 MG PO TABS: 20.0000 mg | ORAL_TABLET | Freq: Every day | ORAL | 3 refills | Status: DC

## 2017-11-26 MED ORDER — FUROSEMIDE 40 MG PO TABS: 40.0000 mg | ORAL_TABLET | Freq: Every day | ORAL | 3 refills | Status: DC

## 2017-11-26 NOTE — Progress Notes (Signed)
Cardiology Office Note   Date:  11/26/2017   ID:  Erica, Foster 1934-12-26, MRN [id number]  PCP:  [name], NP  Cardiologist: Dr. Sallyanne Kuster, 10/14/2017 [name], PA-C   Chief Complaint  Patient presents with  . Follow-up    blood pressure issues     History of Present Illness: [name] is a 83 y.o. female with a history of SSS s/p MDT Revo 2013 in [place], non-Hodgkin's lymphoma and admission, HTN, hypothyroid, LE edema on Lasix, nl EF 2017 echo  5/22 phone notes regarding increased fatigue, palpitations, on Lasix 40 mg twice daily, lisinopril 10 mg daily added  [name] presents for cardiology follow up.   BP is high in the morning and in the evening, but has good readings at other times. She has been taking the lisinopril 10 mg x 2 tabs in the morning. She is taking Lasix 20 mg bid.   She had an episode of palpitations last week. Her heart rate was rapid and she was symptomatic with this. She felt a choking sensation. It started when she was in bed. She denies CP or SOB with this.   She has had palpitations in the past, but this is the first time in a long time.  They do not usually last this long.  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  She has significant lower extremity daytime edema.  She does not wake with leg swelling but gets it significantly during the day.  She has not been wearing compression stockings and does not think she can get them on and off.  She denies orthopnea or PND.   Past Medical History:  Diagnosis Date  . Anemia    pt denies  . Arthritis   . Atrial fibrillation ([place])   . Cataract    bil  . Cystocele    history with rectocele, pt denies  . Dysrhythmia   . GERD (gastroesophageal reflux disease)   . H/O hiatal hernia   . Hemorrhoid   . History of pancreatitis    pt denies and denies as of today 08/09/2017  . Hypothyroidism   . Non Hodgkin's lymphoma ([place])    dx 2012 and now in  remission  . Osteoporosis   . Pacemaker 09/15/2011   Medtronic Revo  . Pericarditis   . Pleural effusion, bilateral    history of  . Pulmonary nodules    history of  . Scoliosis   . Varicose vein     Past Surgical History:  Procedure Laterality Date  . ABDOMINAL HYSTERECTOMY  04/29/04   partial  . BREAST SURGERY  07/30/01   biopsy x 3, bilateral  . COLONOSCOPY    . EYE SURGERY     cataracts  . INSERT / REPLACE / REMOVE PACEMAKER     2013  . MASS EXCISION Right 10/28/2012   Procedure: Removal of mass on neck       ;  Surgeon: Adin Hector, MD;  Location: [facility];  Service: General;  Laterality: Right;  . PACEMAKER INSERTION  09/15/11   MDT  . PERMANENT PACEMAKER INSERTION N/A 09/15/2011   Procedure: PERMANENT PACEMAKER INSERTION;  Surgeon: Sanda Klein, MD;  Location: [facility];  Service: Cardiovascular;  Laterality: N/A;  . RADIOACTIVE SEED GUIDED EXCISIONAL BREAST BIOPSY Left 08/11/2017   Procedure: LEFT RADIOACTIVE SEED GUIDED EXCISIONAL BREAST BIOPSY ERAS PATHWAY;  Surgeon: Stark Klein, MD;  Location: [facility];  Service:  General;  Laterality: Left;  . TONSILLECTOMY      Current Outpatient Medications  Medication Sig Dispense Refill  . acetaminophen (TYLENOL) 500 MG tablet Take 500 mg by mouth every 6 (six) hours as needed (for pain.).     [name] ALPRAZolam (XANAX) 0.5 MG tablet TAKE 1 TABLET BY MOUTH AT BEDTIME AS NEEDED 30 tablet 0  . Calcium Carbonate-Vitamin D (CALTRATE 600+D) 600-400 MG-UNIT per tablet Take 1 tablet by mouth 2 (two) times daily.    [name] denosumab (PROLIA) 60 MG/ML SOLN injection Inject 60 mg into the skin every 6 (six) months. Reported on 10/16/2015    . furosemide (LASIX) 20 MG tablet TAKE 1 TABLET BY MOUTH ONCE DAILY 90 tablet 3  . gabapentin (NEURONTIN) 300 MG capsule Take 1 capsule (300 mg total) by mouth 3 (three) times daily as needed (for nerve pain.). 90 capsule 2  . HIZENTRA 4 GM/20ML SOLN Inject 4 g into the skin every Wednesday.     [name] ibuprofen  (ADVIL,MOTRIN) 200 MG tablet Take 200-400 mg by mouth every 6 (six) hours as needed (for pain.).    [name] levothyroxine (SYNTHROID, LEVOTHROID) 100 MCG tablet TAKE 1 TABLET (100 MCG) BY MOUTH ONCE DAILY IN THE MORNING. 90 tablet 0  . lisinopril (PRINIVIL,ZESTRIL) 10 MG tablet Take 1 tablet (10 mg total) by mouth daily. (Patient taking differently: Take 10 mg by mouth 2 (two) times daily. ) 30 tablet 3  . loratadine (CLARITIN) 10 MG tablet Take 10 mg by mouth daily.    . Magnesium Oxide 400 (240 Mg) MG TABS Take 1 tablet (400 mg total) by mouth daily. 30 tablet   . omeprazole (PRILOSEC) 40 MG capsule Take 1 capsule (40 mg total) by mouth daily as needed. 30 capsule 5  . potassium chloride (K-DUR) 10 MEQ tablet take 1 tablet by mouth once daily (Patient taking differently: TAKE 1 TABLET (10 MEQ) BY MOUTH ONCE DAILY IN THE MORNING.) 90 tablet 3  . traMADol (ULTRAM) 50 MG tablet Take 1 tablet (50 mg total) by mouth every 6 (six) hours as needed (FOR PAIN.). 30 tablet 0   No current facility-administered medications for this visit.     Allergies:   Amoxicillin; Buprenorphine; Codeine; Demerol; Morphine and related; Sulfonamide derivatives; and Vicodin [hydrocodone-acetaminophen]    Social History:  The patient  reports that she has never smoked. She has never used smokeless tobacco. She reports that she drinks alcohol. She reports that she does not use drugs.   Family History:  The patient's family history includes Breast cancer in her sister; Heart disease in her brother and daughter; Lymphoma in her daughter; Stroke in her father and mother.    ROS:  Please see the history of present illness. All other systems are reviewed and negative.    PHYSICAL EXAM: VS:  BP 122/82 (BP Location: Left Arm)   Pulse 76   Ht 5\' 4"  (1.626 m)   Wt 131 lb 12.8 oz (59.8 kg)   BMI 22.62 kg/m  , BMI Body mass index is 22.62 kg/m. GEN: Well nourished, well developed, female in no acute distress  HEENT: normal for  age  Neck: no JVD, no carotid bruit, no masses Cardiac: RRR; no murmur, no rubs, or gallops Respiratory:  clear to auscultation bilaterally, normal work of breathing GI: soft, nontender, nondistended, + BS MS: no deformity or atrophy; trace pedal edema; distal pulses are 2+ in all 4 extremities   Skin: warm and dry, no rash Neuro:  Strength and  sensation are intact Psych: euthymic mood, full affect   EKG:  EKG is not ordered today.   ECHO: 04/28/2016 - Left ventricle: The cavity size was normal. Systolic function was   normal. The estimated ejection fraction was in the range of 60%   to 65%. Wall motion was normal; there were no regional wall   motion abnormalities. Features are consistent with a pseudonormal   left ventricular filling pattern, with concomitant abnormal   relaxation and increased filling pressure (grade 2 diastolic   dysfunction). Doppler parameters are consistent with high   ventricular filling pressure. - Aortic valve: Trileaflet; normal thickness, mildly calcified   leaflets. - Pulmonary arteries: PA peak pressure: 45 mm Hg (S).  Impressions:  - The right ventricular systolic pressure was increased consistent   with moderate pulmonary hypertension.   Recent Labs: 08/09/2017: ALT 17; Hemoglobin 14.4; Platelets 250 10/26/2017: BUN 13; Creatinine, Ser 0.74; Magnesium 2.1; Potassium 3.9; Sodium 142; TSH 0.69    Lipid Panel    Component Value Date/Time   CHOL 232 (H) 10/26/2017 1035   TRIG 196.0 (H) 10/26/2017 1035   HDL 46.60 10/26/2017 1035   CHOLHDL 5 10/26/2017 1035   VLDL 39.2 10/26/2017 1035   LDLCALC 147 (H) 10/26/2017 1035     Wt Readings from Last 3 Encounters:  11/26/17 131 lb 12.8 oz (59.8 kg)  10/26/17 132 lb (59.9 kg)  10/14/17 130 lb (59 kg)     Other studies Reviewed: Additional studies/ records that were reviewed today include: Office notes, hospital records and testing.  ASSESSMENT AND PLAN:  1.  Hypertension: Her blood  pressure is well controlled in the office today. -It sounds like at the beginning of the day in the end of the day her blood pressure runs higher. -She is also having problems with daytime lower extremity edema. -Suggested she take Lasix 40 mg daily in the morning.  Change the lisinopril to 20 mg nightly. -We will send in prescriptions for Lasix 40 mg tablets and lisinopril 20 mg tablets as I do not think her blood pressure will be controlled with less. -Check a BMET on the higher dose of Lasix. -Explained that we have to manipulate the medicine so that she tolerates them and they do a good job controlling her blood pressure.  She is agreeable to this.  2.  Palpitations/sinus node dysfunction: Her device was interrogated in the office today.  There was no atrial fibrillation recently.  No high heart rate episodes were documented.  Therefore, the likelihood is that she had a PVC or PAC but these would have been very brief.  There are some episodes of nonsustained VT recorded but she has had none longer than a second or 2 in over a year.  No PAF in over a year. -If she continues to be bothered by palpitations, consider decreasing the lisinopril and adding low-dose of Toprol-XL.  3.  Chronic diastolic CHF: Her weight is stable.  I explained that the lower extremity edema is more likely to be from venous insufficiency or venous reflux.  She is encouraged to elevate her feet during the day, where support stockings or socks of one sort or another if she can. -Try taking Lasix 40 mg in the morning and see if that helps her daytime edema.  Current medicines are reviewed at length with the patient today.  The patient has concerns regarding medicines.  Concerns were addressed  The following changes have been made: Make Lasix 40 mg once a day  and lisinopril 20 mg once a day but not at the same time.  Labs/ tests ordered today include:   Orders Placed This Encounter  Procedures  . Basic metabolic panel      Disposition:   FU with Dr. Sallyanne Kuster  Signed, [name], PA-C  11/26/2017 5:59 PM    [facility] Phone: [phone]; Fax: [phone]  This note was written with the assistance of speech recognition software. Please excuse any transcriptional errors.

## 2017-11-26 NOTE — Patient Instructions (Signed)
Medication Instructions: Increase: Lasix 40 mg every morning      Lisinopril 20 mg daily at bedtime.  If you need a refill on your cardiac medications before your next appointment, please call your pharmacy.   Labwork: Your physician recommends that you return for lab work in: Today (BMP)     Follow-Up: Your physician wants you to follow-up in [place] scheduled appointment with Dr. Knox Saliva   Special Instructions:    Thank you for choosing [facility]!!

## 2017-11-27 LAB — BASIC METABOLIC PANEL
BUN / CREAT RATIO: 23 (ref 12–28)
BUN: 18 mg/dL (ref 8–27)
CO2: 24 mmol/L (ref 20–29)
CREATININE: 0.79 mg/dL (ref 0.57–1.00)
Calcium: 9.8 mg/dL (ref 8.7–10.3)
Chloride: 104 mmol/L (ref 96–106)
GFR calc non Af Amer: 70 mL/min/{1.73_m2} (ref >59)
GFR, EST AFRICAN AMERICAN: 81 mL/min/{1.73_m2} (ref >59)
GLUCOSE: 75 mg/dL (ref 65–99)
Potassium: 4.3 mmol/L (ref 3.5–5.2)
SODIUM: 143 mmol/L (ref 134–144)

## 2017-11-30 NOTE — Telephone Encounter (Signed)
Patient advised of recommendations last week and appointment scheduled w/ [name], PA for Friday 11/26/17 at [facility].

## 2017-12-01 ENCOUNTER — Telehealth: Payer: Self-pay

## 2017-12-01 NOTE — Telephone Encounter (Signed)
Spoke with patient and she states her bp is still running high. She wanted me to let you know.

## 2017-12-02 LAB — CUP PACEART INCLINIC DEVICE CHECK
Date Time Interrogation Session: 20190606121213
Implantable Lead Implant Date: 20130319
Implantable Lead Location: 753859
Implantable Pulse Generator Implant Date: 20130319
MDC IDC LEAD IMPLANT DT: 20130319
MDC IDC LEAD LOCATION: 753860

## 2017-12-03 NOTE — Telephone Encounter (Signed)
-----   Message from Lonn Georgia, PA-C sent at 12/02/2017  2:56 PM EDT ----- See what time of day her blood pressure is running high. If it is running high mostly in the morning, increase the lisinopril to 30 mg nightly.  If it is running high in the evening, see if she can take the lisinopril a little earlier, even if it is mid afternoon.  Keep [place] informed about what her blood pressure is doing.  Make sure to include the time of day with the readings.  Thanks

## 2017-12-03 NOTE — Telephone Encounter (Signed)
Left message for patient to contact office to discuss blood pressure.

## 2017-12-09 ENCOUNTER — Telehealth: Payer: Self-pay | Admitting: Cardiovascular Disease

## 2017-12-09 NOTE — Telephone Encounter (Signed)
Patient called office today stating blood pressure was doing better.

## 2017-12-09 NOTE — Telephone Encounter (Signed)
Patient was returning a call and stated that her blood pressure was fine and her swelling was gone. She did not have any specific readings with her. She was not at home. Message routed to [place] [facility] that called her originally for her knowledge.

## 2017-12-09 NOTE — Telephone Encounter (Signed)
New Message:       FYI: Pt states her BP is fine and swelling is gone.

## 2017-12-14 ENCOUNTER — Ambulatory Visit (INDEPENDENT_AMBULATORY_CARE_PROVIDER_SITE_OTHER): Payer: Medicare Other | Admitting: *Deleted

## 2017-12-14 ENCOUNTER — Telehealth: Payer: Self-pay | Admitting: Cardiology

## 2017-12-14 DIAGNOSIS — R001 Bradycardia, unspecified: Secondary | ICD-10-CM

## 2017-12-14 DIAGNOSIS — R002 Palpitations: Secondary | ICD-10-CM

## 2017-12-14 NOTE — Progress Notes (Signed)
Remote pacemaker transmission.   

## 2017-12-14 NOTE — Telephone Encounter (Signed)
Spoke with pt and reminded pt of remote transmission that is due today. Pt verbalized understanding.   

## 2017-12-16 ENCOUNTER — Telehealth: Payer: Self-pay | Admitting: Family

## 2017-12-16 NOTE — Telephone Encounter (Signed)
Prolia benefits received PA not required 20% prolia $40 Admin fee   Patient may owe approximately $52 OOP  Patient due after 01/05/18  Letter mailed to inform patient of benefits and to schedule

## 2017-12-17 LAB — CUP PACEART REMOTE DEVICE CHECK
Battery Voltage: 2.96 V
Date Time Interrogation Session: 20190618183423
Implantable Lead Implant Date: 20130319
Implantable Lead Location: 753859
Implantable Lead Location: 753860
Lead Channel Impedance Value: 384 Ohm
Lead Channel Setting Sensing Sensitivity: 0.9 mV
MDC IDC LEAD IMPLANT DT: 20130319
MDC IDC MSMT LEADCHNL RA SENSING INTR AMPL: 1.064 mV
MDC IDC MSMT LEADCHNL RV IMPEDANCE VALUE: 480 Ohm
MDC IDC MSMT LEADCHNL RV SENSING INTR AMPL: 14.327 mV
MDC IDC PG IMPLANT DT: 20130319
MDC IDC SET LEADCHNL RA PACING AMPLITUDE: 2 V
MDC IDC STAT BRADY RA PERCENT PACED: 66.73 %

## 2017-12-21 ENCOUNTER — Inpatient Hospital Stay: Payer: Medicare Other | Attending: Internal Medicine | Admitting: Internal Medicine

## 2017-12-21 ENCOUNTER — Encounter: Payer: Self-pay | Admitting: Internal Medicine

## 2017-12-21 ENCOUNTER — Inpatient Hospital Stay: Payer: Medicare Other

## 2017-12-21 ENCOUNTER — Telehealth: Payer: Self-pay | Admitting: Internal Medicine

## 2017-12-21 VITALS — BP 143/88 | HR 86 | Temp 98.1°F | Resp 18 | Ht 64.0 in | Wt 132.8 lb

## 2017-12-21 DIAGNOSIS — Z8572 Personal history of non-Hodgkin lymphomas: Secondary | ICD-10-CM | POA: Diagnosis not present

## 2017-12-21 DIAGNOSIS — C8221 Follicular lymphoma grade III, unspecified, lymph nodes of head, face, and neck: Secondary | ICD-10-CM

## 2017-12-21 LAB — CBC WITH DIFFERENTIAL/PLATELET
BASOS ABS: 0.1 10*3/uL (ref 0.0–0.1)
Basophils Relative: 1 %
EOS PCT: 3 %
Eosinophils Absolute: 0.2 10*3/uL (ref 0.0–0.5)
HEMATOCRIT: 38.9 % (ref 34.8–46.6)
HEMOGLOBIN: 13 g/dL (ref 11.6–15.9)
LYMPHS PCT: 38 %
Lymphs Abs: 2.1 10*3/uL (ref 0.9–3.3)
MCH: 31.2 pg (ref 25.1–34.0)
MCHC: 33.5 g/dL (ref 31.5–36.0)
MCV: 93.2 fL (ref 79.5–101.0)
Monocytes Absolute: 0.4 10*3/uL (ref 0.1–0.9)
Monocytes Relative: 7 %
NEUTROS ABS: 3 10*3/uL (ref 1.5–6.5)
NEUTROS PCT: 51 %
PLATELETS: 242 10*3/uL (ref 145–400)
RBC: 4.17 MIL/uL (ref 3.70–5.45)
RDW: 13.9 % (ref 11.2–14.5)
WBC: 5.7 10*3/uL (ref 3.9–10.3)

## 2017-12-21 LAB — COMPREHENSIVE METABOLIC PANEL
ALT: 13 U/L (ref 0–44)
ANION GAP: 9 (ref 5–15)
AST: 24 U/L (ref 15–41)
Albumin: 4 g/dL (ref 3.5–5.0)
Alkaline Phosphatase: 63 U/L (ref 38–126)
BUN: 14 mg/dL (ref 8–23)
CHLORIDE: 107 mmol/L (ref 98–111)
CO2: 26 mmol/L (ref 22–32)
Calcium: 9.5 mg/dL (ref 8.9–10.3)
Creatinine, Ser: 0.76 mg/dL (ref 0.44–1.00)
Glucose, Bld: 82 mg/dL (ref 70–99)
POTASSIUM: 3.9 mmol/L (ref 3.5–5.1)
Sodium: 142 mmol/L (ref 135–145)
Total Bilirubin: 0.4 mg/dL (ref 0.3–1.2)
Total Protein: 6.9 g/dL (ref 6.5–8.1)

## 2017-12-21 LAB — LACTATE DEHYDROGENASE: LDH: 274 U/L — AB (ref 98–192)
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
## 2017-12-21 NOTE — Telephone Encounter (Signed)
Scheduled appt per 6/25 los - sent reminder letter in the mail with appt  Date and time.

## 2017-12-29 ENCOUNTER — Telehealth: Payer: Self-pay | Admitting: *Deleted

## 2017-12-29 NOTE — Telephone Encounter (Signed)
Medication has been ordered.

## 2017-12-29 NOTE — Telephone Encounter (Signed)
Copied from [place] [id number]. Topic: Quick Communication - See Telephone Encounter >> Dec 16, 2017  2:58 PM Laren Everts wrote: CRM for notification. See Telephone encounter for: 12/16/17. Letter mailed for Prolia benefits, please schedule after 01/05/18, notify office when scheduled so we can order Prolia >> Dec 27, 2017  1:40 PM Percell Belt A wrote: Sch'd for the 17th , go ahead and order injection

## 2017-12-30 ENCOUNTER — Other Ambulatory Visit: Payer: Self-pay | Admitting: Family

## 2018-01-03 ENCOUNTER — Other Ambulatory Visit: Payer: Self-pay | Admitting: Family

## 2018-01-03 NOTE — Telephone Encounter (Signed)
Last alprazolam RX: 11/18/17, #30 Last OV: 10/26/17 Next OV: none scheduled with PCP UDS: 10/26/17 CSC: 08/2016, past due CSR: No discrepancies identified

## 2018-01-03 NOTE — Telephone Encounter (Signed)
Notified pt. She states she signed contract a few months ago. I apologized to the pt that we do not have a signed copy scanned into her chart at this time except from 08/2016. Pt states she has a Prolia injection scheduled for 01/12/18 and she will pick up Rx and sign contract at that time. Pt states her last refill was May and she "is not in dire need of the medication right away". Rx and contract placed at the front desk.

## 2018-01-03 NOTE — Telephone Encounter (Signed)
Medication received.

## 2018-01-03 NOTE — Telephone Encounter (Signed)
Rx printed. Let's leave rx at front desk along with controlled substance contract and she can pick up when she signs please.

## 2018-01-12 ENCOUNTER — Ambulatory Visit (INDEPENDENT_AMBULATORY_CARE_PROVIDER_SITE_OTHER): Payer: Medicare Other

## 2018-01-12 DIAGNOSIS — M818 Other osteoporosis without current pathological fracture: Secondary | ICD-10-CM

## 2018-01-12 MED ORDER — DENOSUMAB 60 MG/ML ~~LOC~~ SOSY
60.0000 mg | PREFILLED_SYRINGE | Freq: Once | SUBCUTANEOUS | Status: AC
  Administered 2018-01-12: 60 mg via SUBCUTANEOUS

## 2018-01-12 NOTE — Progress Notes (Signed)
[place] Landgrebe S O'Sullivan NP 

## 2018-01-12 NOTE — Progress Notes (Signed)
Pre visit review using our clinic tool,if applicable. No additional management support is needed unless otherwise documented below in the visit note.   Patient in for Prolia injection per order from [name] NP.  No complaints voiced. Patient states she would like for Xanax to be removed from her medication list.   Given 60 mg SQ left arm. Patient tolerated well. Reminder card given for next injection.

## 2018-01-13 ENCOUNTER — Ambulatory Visit (INDEPENDENT_AMBULATORY_CARE_PROVIDER_SITE_OTHER): Payer: Medicare Other | Admitting: Cardiovascular Disease

## 2018-01-13 ENCOUNTER — Encounter: Payer: Self-pay | Admitting: Cardiovascular Disease

## 2018-01-13 VITALS — BP 130/82 | HR 89 | Ht 64.0 in | Wt 130.2 lb

## 2018-01-13 DIAGNOSIS — I9789 Other postprocedural complications and disorders of the circulatory system, not elsewhere classified: Secondary | ICD-10-CM | POA: Diagnosis not present

## 2018-01-13 DIAGNOSIS — I4891 Unspecified atrial fibrillation: Secondary | ICD-10-CM

## 2018-01-13 DIAGNOSIS — I10 Essential (primary) hypertension: Secondary | ICD-10-CM | POA: Diagnosis not present

## 2018-01-13 DIAGNOSIS — I5032 Chronic diastolic (congestive) heart failure: Secondary | ICD-10-CM

## 2018-01-13 DIAGNOSIS — Z950 Presence of cardiac pacemaker: Secondary | ICD-10-CM

## 2018-01-13 DIAGNOSIS — I318 Other specified diseases of pericardium: Secondary | ICD-10-CM

## 2018-01-13 DIAGNOSIS — I495 Sick sinus syndrome: Secondary | ICD-10-CM | POA: Diagnosis not present

## 2018-01-13 NOTE — Progress Notes (Signed)
Cardiology Office Note    Date:  01/14/2018   ID:  [name], DOB 1934/12/30, MRN [id number]  PCP:  [name], NP  Cardiologist:   Sanda Klein, MD   Chief Complaint  Patient presents with  . Follow-up    pacer check, patient still complaints of papitations.    History of Present Illness:  [name] is a 83 y.o. female with sinus node dysfunction and implantation of a dual-chamber permanent pacemaker (Medtronic Revo 2013, programmed AAIR to avoid ventricular paced beats), non-Hodgkin's lymphoma in remission.  Feeling better.  We stopped her diltiazem, added diuretics because of edema.  This is now for the most part resolved.  She continues to get weekly infusions of IVIG.  She has good functional status, NYHA functional class I-II.  The patient specifically denies any chest pain at rest exertion, dyspnea at rest or with exertion, orthopnea, paroxysmal nocturnal dyspnea, syncope, palpitations, focal neurological deficits, intermittent claudication, unexplained weight gain, cough, hemoptysis or wheezing.  She did have some shortness of breath in 2018 that responded promptly to diuretics.  Last September she had a single syncopal events shortly after arriving in [place] after a long airline trip. Echo showed normal left ventricular systolic function but evidence of diastolic dysfunction and increased filling pressure.   Pacemaker interrogation via remote download on June 18 shows normal device function.  She has 67% atrial pacing and her device is programmed AAIR.  Battery voltage is 2.96 V.  (MRI conditional Medtronic Revo dual-chamber device was implanted in 2013 , ERI at 2.81 V).  Activity level is further improved at about 4.4 hours/day and her heart rate histogram distribution is very favorable.  Past Medical History:  Diagnosis Date  . Anemia    pt denies  . Arthritis   . Atrial fibrillation ([place])   . Cataract    bil  . Cystocele    history with  rectocele, pt denies  . Dysrhythmia   . GERD (gastroesophageal reflux disease)   . H/O hiatal hernia   . Hemorrhoid   . History of pancreatitis    pt denies and denies as of today 08/09/2017  . Hypothyroidism   . Non Hodgkin's lymphoma ([place])    dx 2012 and now in remission  . Osteoporosis   . Pacemaker 09/15/2011   Medtronic Revo  . Pericarditis   . Pleural effusion, bilateral    history of  . Pulmonary nodules    history of  . Scoliosis   . Varicose vein     Past Surgical History:  Procedure Laterality Date  . ABDOMINAL HYSTERECTOMY  04/29/04   partial  . BREAST SURGERY  07/30/01   biopsy x 3, bilateral  . COLONOSCOPY    . EYE SURGERY     cataracts  . INSERT / REPLACE / REMOVE PACEMAKER     2013  . MASS EXCISION Right 10/28/2012   Procedure: Removal of mass on neck       ;  Surgeon: Adin Hector, MD;  Location: [facility];  Service: General;  Laterality: Right;  . PACEMAKER INSERTION  09/15/11   MDT  . PERMANENT PACEMAKER INSERTION N/A 09/15/2011   Procedure: PERMANENT PACEMAKER INSERTION;  Surgeon: Sanda Klein, MD;  Location: [facility];  Service: Cardiovascular;  Laterality: N/A;  . RADIOACTIVE SEED GUIDED EXCISIONAL BREAST BIOPSY Left 08/11/2017   Procedure: LEFT RADIOACTIVE SEED GUIDED EXCISIONAL BREAST BIOPSY ERAS PATHWAY;  Surgeon: Stark Klein, MD;  Location: [facility];  Service: General;  Laterality: Left;  . TONSILLECTOMY      Current Medications: Outpatient Medications Prior to Visit  Medication Sig Dispense Refill  . acetaminophen (TYLENOL) 500 MG tablet Take 500 mg by mouth every 6 (six) hours as needed (for pain.).     [name] Calcium Carbonate-Vitamin D (CALTRATE 600+D) 600-400 MG-UNIT per tablet Take 1 tablet by mouth 2 (two) times daily.    [name] denosumab (PROLIA) 60 MG/ML SOLN injection Inject 60 mg into the skin every 6 (six) months. Reported on 10/16/2015    . furosemide (LASIX) 40 MG tablet Take 1 tablet (40 mg total) by mouth daily. 90 tablet 3  . gabapentin  (NEURONTIN) 300 MG capsule Take 1 capsule (300 mg total) by mouth 3 (three) times daily as needed (for nerve pain.). 90 capsule 2  . HIZENTRA 4 GM/20ML SOLN Inject 4 g into the skin every Wednesday.     [name] ibuprofen (ADVIL,MOTRIN) 200 MG tablet Take 200-400 mg by mouth every 6 (six) hours as needed (for pain.).    [name] levothyroxine (SYNTHROID, LEVOTHROID) 100 MCG tablet TAKE 1 TABLET BY MOUTH ONCE DAILY IN THE MORNING 90 tablet 0  . lisinopril (PRINIVIL,ZESTRIL) 20 MG tablet Take 1 tablet (20 mg total) by mouth at bedtime. 90 tablet 3  . Magnesium Oxide 400 (240 Mg) MG TABS Take 1 tablet (400 mg total) by mouth daily. 30 tablet   . omeprazole (PRILOSEC) 40 MG capsule Take 1 capsule (40 mg total) by mouth daily as needed. 30 capsule 5  . potassium chloride (K-DUR) 10 MEQ tablet take 1 tablet by mouth once daily (Patient taking differently: TAKE 1 TABLET (10 MEQ) BY MOUTH ONCE DAILY IN THE MORNING.) 90 tablet 3  . traMADol (ULTRAM) 50 MG tablet Take 1 tablet (50 mg total) by mouth every 6 (six) hours as needed (FOR PAIN.). 30 tablet 0   No facility-administered medications prior to visit.      Allergies:   Amoxicillin; Buprenorphine; Codeine; Demerol; Morphine and related; Sulfonamide derivatives; and Vicodin [hydrocodone-acetaminophen]   Social History   Socioeconomic History  . Marital status: Widowed    Spouse name: Not on file  . Number of children: 3  . Years of education: Not on file  . Highest education level: Not on file  Occupational History  . Occupation: self employed  Social Needs  . Financial resource strain: Not on file  . Food insecurity:    Worry: Not on file    Inability: Not on file  . Transportation needs:    Medical: Not on file    Non-medical: Not on file  Tobacco Use  . Smoking status: Never Smoker  . Smokeless tobacco: Never Used  Substance and Sexual Activity  . Alcohol use: Yes    Comment: 1 glass of wine occasionally  . Drug use: No  . Sexual activity: Not  on file  Lifestyle  . Physical activity:    Days per week: Not on file    Minutes per session: Not on file  . Stress: Not on file  Relationships  . Social connections:    Talks on phone: Not on file    Gets together: Not on file    Attends religious service: Not on file    Active member of club or organization: Not on file    Attends meetings of clubs or organizations: Not on file    Relationship status: Not on file  Other Topics Concern  . 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  She lives alone in a 3-story home.  There is a lift chair which she does not use (placed for her husband).     Family History:  The patient's family history includes Breast cancer in her sister; Heart disease in her brother and daughter; Lymphoma in her daughter; Stroke in her father and mother.   ROS:   Please see the history of present illness.    ROS All other systems reviewed and are negative.   PHYSICAL EXAM:   VS:  BP 130/82 (BP Location: Left Arm, Patient Position: Sitting)   Pulse 89   Ht 5\' 4"  (1.626 m)   Wt 130 lb 3.2 oz (59.1 kg)   SpO2 96%   BMI 22.35 kg/m      General: Alert, oriented x3, no distress, appears well.  Healthy left subclavian pacemaker site. Head: no evidence of trauma, PERRL, EOMI, no exophtalmos or lid lag, no myxedema, no xanthelasma; normal ears, nose and oropharynx Neck: normal jugular venous pulsations and no hepatojugular reflux; brisk carotid pulses without delay and no carotid bruits Chest: clear to auscultation, no signs of consolidation by percussion or palpation, normal fremitus, symmetrical and full respiratory excursions Cardiovascular: normal position and quality of the apical impulse, regular rhythm, normal first and second heart sounds, no murmurs, rubs or gallops Abdomen: no tenderness or distention, no masses by palpation, no abnormal pulsatility or arterial  bruits, normal bowel sounds, no hepatosplenomegaly Extremities: no clubbing, cyanosis or edema; 2+ radial, ulnar and brachial pulses bilaterally; 2+ right femoral, posterior tibial and dorsalis pedis pulses; 2+ left femoral, posterior tibial and dorsalis pedis pulses; no subclavian or femoral bruits Neurological: grossly nonfocal Psych: Normal mood and affect   Wt Readings from Last 3 Encounters:  01/13/18 130 lb 3.2 oz (59.1 kg)  12/21/17 132 lb 12.8 oz (60.2 kg)  11/26/17 131 lb 12.8 oz (59.8 kg)      Studies/Labs Reviewed:   EKG:  EKG is not ordered today.  The ekg ordered October 14, 2017 demonstrates atrial paced, ventricular sensed rhythm with slightly prolonged delay of 222 ms, otherwise normal tracing.  QTc 451 ms Recent Labs: 10/26/2017: Magnesium 2.1; TSH 0.69 12/21/2017: ALT 13; BUN 14; Creatinine, Ser 0.76; Hemoglobin 13.0; Platelets 242; Potassium 3.9; Sodium 142   ASSESSMENT:    1. Chronic diastolic heart failure ([place])   2. Essential hypertension   3. SSS (sick sinus syndrome) ([place])   4. Pacemaker   5. Post-traumatic pericarditis   6. Postoperative atrial fibrillation ([facility])      PLAN:  In order of problems listed above:  1. CHF: She has chronic diastolic heart failure, symptoms promptly improved after starting diuretics I suspect she will continue to need diuretics as long as she receives volume loading with the IVIG infusions.  Reminded her to avoid excessive sodium in her diet.  Keep her off the diltiazem.  Use nonsteroidal anti-inflammatory drugs sparingly.  This was being used to avoid episodes of atrial arrhythmia, which have not been a problem recently.  Blood pressure remains normal after stopping this medication. 2. HTN: Remains well controlled on lisinopril monotherapy. 3. SSS: Heart rate histogram is appropriate, keep same sensor settings. 4. PM: Remote downloads every 3 months and yearly office visits 5. History of post procedural acute pericarditis after  pacemaker implantation, resolved. Had atrial fibrillation related to acute pericarditis, which has not recurred in years. No evidence of constrictive physiology on echo in  2018. She has not received chest radiation therapy for lymphoma, just chemotherapy.  Consider repeat echo or even cardiac MRI if signs of heart failure worsen, to look for signs of constriction.   Medication Adjustments/Labs and Tests Ordered: Current medicines are reviewed at length with the patient today.  Concerns regarding medicines are outlined above.  Medication changes, Labs and Tests ordered today are listed in the Patient Instructions below. Patient Instructions  Dr Sallyanne Kuster recommends that you continue on your current medications as directed. Please refer to the Current Medication list given to you today.  Remote monitoring is used to monitor your Pacemaker or ICD from home. This monitoring reduces the number of office visits required to check your device to one time per year. It allows [place] to keep an eye on the functioning of your device to ensure it is working properly. You are scheduled for a device check from home on Tuesday, September 17th, 2019. You may send your transmission at any time that day. If you have a wireless device, the transmission will be sent automatically. After your physician reviews your transmission, you will receive a notification with your next transmission date.  To improve our patient care and to more adequately follow your device, [facility] has decided, as a practice, to start following each patient four times a year with your home monitor. This means that you may experience a remote appointment that is close to an in-office appointment with your physician. Your insurance will apply at the same rate as other remote monitoring transmissions.  Dr Sallyanne Kuster recommends that you schedule a follow-up appointment in 12 months with a pacemaker check. You will receive a reminder letter in the mail two  months in advance. If you don't receive a letter, please call our office to schedule the follow-up appointment.  If you need a refill on your cardiac medications before your next appointment, please call your pharmacy.    Signed, Sanda Klein, MD  01/14/2018 4:24 PM    [address], [place], [place]  [address] Phone: [phone]; Fax: [phone]

## 2018-01-13 NOTE — Patient Instructions (Signed)
Dr Sallyanne Kuster recommends that you continue on your current medications as directed. Please refer to the Current Medication list given to you today.  Remote monitoring is used to monitor your Pacemaker or ICD from home. This monitoring reduces the number of office visits required to check your device to one time per year. It allows [place] to keep an eye on the functioning of your device to ensure it is working properly. You are scheduled for a device check from home on Tuesday, September 17th, 2019. You may send your transmission at any time that day. If you have a wireless device, the transmission will be sent automatically. After your physician reviews your transmission, you will receive a notification with your next transmission date.  To improve our patient care and to more adequately follow your device, [facility] has decided, as a practice, to start following each patient four times a year with your home monitor. This means that you may experience a remote appointment that is close to an in-office appointment with your physician. Your insurance will apply at the same rate as other remote monitoring transmissions.  Dr Sallyanne Kuster recommends that you schedule a follow-up appointment in 12 months with a pacemaker check. You will receive a reminder letter in the mail two months in advance. If you don't receive a letter, please call our office to schedule the follow-up appointment.  If you need a refill on your cardiac medications before your next appointment, please call your pharmacy.

## 2018-01-14 DIAGNOSIS — I318 Other specified diseases of pericardium: Secondary | ICD-10-CM | POA: Insufficient documentation

## 2018-01-14 DIAGNOSIS — I4891 Unspecified atrial fibrillation: Secondary | ICD-10-CM | POA: Insufficient documentation

## 2018-01-14 DIAGNOSIS — I9789 Other postprocedural complications and disorders of the circulatory system, not elsewhere classified: Secondary | ICD-10-CM

## 2018-01-16 ENCOUNTER — Other Ambulatory Visit: Payer: Self-pay | Admitting: Cardiovascular Disease

## 2018-01-26 ENCOUNTER — Encounter: Payer: Self-pay | Admitting: Medical

## 2018-01-26 ENCOUNTER — Ambulatory Visit: Payer: Medicare Other | Admitting: Medical

## 2018-01-26 VITALS — BP 139/93 | HR 82 | Temp 98.1°F | Resp 16 | Ht 64.0 in | Wt 129.8 lb

## 2018-01-26 DIAGNOSIS — R197 Diarrhea, unspecified: Secondary | ICD-10-CM | POA: Diagnosis not present

## 2018-01-26 DIAGNOSIS — Z87448 Personal history of other diseases of urinary system: Secondary | ICD-10-CM | POA: Diagnosis not present

## 2018-01-26 DIAGNOSIS — R634 Abnormal weight loss: Secondary | ICD-10-CM

## 2018-01-26 DIAGNOSIS — R8290 Unspecified abnormal findings in urine: Secondary | ICD-10-CM | POA: Diagnosis not present

## 2018-01-26 LAB — POC URINALSYSI DIPSTICK (AUTOMATED)
Bilirubin, UA: NEGATIVE
Glucose, UA: NEGATIVE
Ketones, UA: NEGATIVE
Nitrite, UA: NEGATIVE
PH UA: 6 (ref 5.0–8.0)
PROTEIN UA: NEGATIVE
RBC UA: NEGATIVE
Spec Grav, UA: 1.02 (ref 1.010–1.025)
UROBILINOGEN UA: NEGATIVE U/dL — AB

## 2018-01-26 MED ORDER — DICYCLOMINE HCL 10 MG PO CAPS: 10.0000 mg | ORAL_CAPSULE | Freq: Three times a day (TID) | ORAL | 0 refills | Status: DC

## 2018-01-26 NOTE — Patient Instructions (Addendum)
Your one year presentation of loose stools after eating seems probably consistent with IBS-D.  I do want you to go ahead and get stool panel kit and turn it in as soon as possible.  On review of your history past colonoscopy report stated negative.  I am going to provide you with bentyl to see if this helps.  Your weight loss appears to be gradual over the last 2 years. You are not former smoke and negative colonoscopy 10 years ago per comment note. Some blood in urine in past so we repeated that study today. Urine was clear/no blooe. I do want you to follow up with your pcp in 2-3 weeks to discuss weight loss and determined extent your pcp might want to work this up. Some weight loss may be due to decreased caloric intake and muscle atrophy.

## 2018-01-26 NOTE — Progress Notes (Signed)
Subjective:    Patient ID: [name], female    DOB: 12-30-1934, 83 y.o.   MRN: [id number]  HPI Pt in with hx of chronic diarrhea x 1 yr. She state consistently will have bm after each meal. To the point that she will need to look for the bathroom before she eats. No stool culture done in past. No family history of ulcerative colitis or crohn's. No fevers with this loose stools. 2009 comment note states normal colonoscopy. Was not told to repeat.  Pt mentioned some weight loss. She weighed 145 lb in 2016. Not much of appetite. Eats about one meal a day. Remote hx of blood in urine. No hx of smoking.  Pt has tried probiotics. Over the counter tabs. Has not helped much.   Review of Systems  Constitutional: Negative for chills, fatigue and fever.  Respiratory: Negative for cough, chest tightness, shortness of breath and wheezing.   Cardiovascular: Negative for chest pain and palpitations.  Gastrointestinal: Positive for diarrhea. Negative for abdominal distention, abdominal pain, anal bleeding, blood in stool, constipation and nausea.       Loose stools.  Musculoskeletal: Negative for back pain, myalgias and neck pain.  Skin: Negative for rash.  Hematological: Negative for adenopathy. Does not bruise/bleed easily.  Psychiatric/Behavioral: Negative for behavioral problems and confusion.    Past Medical History:  Diagnosis Date  . Anemia    pt denies  . Arthritis   . Atrial fibrillation ([place])   . Cataract    bil  . Cystocele    history with rectocele, pt denies  . Dysrhythmia   . GERD (gastroesophageal reflux disease)   . H/O hiatal hernia   . Hemorrhoid   . History of pancreatitis    pt denies and denies as of today 08/09/2017  . Hypothyroidism   . Non Hodgkin's lymphoma ([place])    dx 2012 and now in remission  . Osteoporosis   . Pacemaker 09/15/2011   Medtronic Revo  . Pericarditis   . Pleural effusion, bilateral    history of  . Pulmonary nodules    history of    . Scoliosis   . Varicose vein      Social History   Socioeconomic History  . Marital status: Widowed    Spouse name: Not on file  . Number of children: 3  . Years of education: Not on file  . Highest education level: Not on file  Occupational History  . Occupation: self employed  Social Needs  . Financial resource strain: Not on file  . Food insecurity:    Worry: Not on file    Inability: Not on file  . Transportation needs:    Medical: Not on file    Non-medical: Not on file  Tobacco Use  . Smoking status: Never Smoker  . Smokeless tobacco: Never Used  Substance and Sexual Activity  . Alcohol use: Yes    Comment: 1 glass of wine occasionally  . Drug use: No  . Sexual activity: Not on file  Lifestyle  . Physical activity:    Days per week: Not on file    Minutes per session: Not on file  . Stress: Not on file  Relationships  . Social connections:    Talks on phone: Not on file    Gets together: Not on file    Attends religious service: Not on file    Active member of club or organization: Not on file    Attends meetings of  clubs or organizations: Not on file    Relationship status: Not on file  . Intimate partner violence:    Fear of current or ex partner: Not on file    Emotionally abused: Not on file    Physically abused: Not on file    Forced sexual activity: Not on file  Other Topics Concern  . 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  She lives alone in a 3-story home.  There is a lift chair which she does not use (placed for her husband).    Past Surgical History:  Procedure Laterality Date  . ABDOMINAL HYSTERECTOMY  04/29/04   partial  . BREAST SURGERY  07/30/01   biopsy x 3, bilateral  . COLONOSCOPY    . EYE SURGERY     cataracts  . INSERT / REPLACE / REMOVE PACEMAKER     2013  . MASS EXCISION Right 10/28/2012   Procedure: Removal of mass on neck       ;   Surgeon: Adin Hector, MD;  Location: [facility];  Service: General;  Laterality: Right;  . PACEMAKER INSERTION  09/15/11   MDT  . PERMANENT PACEMAKER INSERTION N/A 09/15/2011   Procedure: PERMANENT PACEMAKER INSERTION;  Surgeon: Sanda Klein, MD;  Location: [facility];  Service: Cardiovascular;  Laterality: N/A;  . RADIOACTIVE SEED GUIDED EXCISIONAL BREAST BIOPSY Left 08/11/2017   Procedure: LEFT RADIOACTIVE SEED GUIDED EXCISIONAL BREAST BIOPSY ERAS PATHWAY;  Surgeon: Stark Klein, MD;  Location: [facility];  Service: General;  Laterality: Left;  . TONSILLECTOMY      Family History  Problem Relation Age of Onset  . Stroke Mother   . Stroke Father   . Heart disease Daughter        congenital "whole in her heart"  . Lymphoma Daughter        nonhodgkins  . Breast cancer Sister   . Heart disease Brother     Allergies  Allergen Reactions  . Amoxicillin Hives    Has patient had a PCN reaction causing immediate rash, facial/tongue/throat swelling, SOB or lightheadedness with hypotension: No Has patient had a PCN reaction causing severe rash involving mucus membranes or skin necrosis: No Has patient had a PCN reaction that required hospitalization: No Has patient had a PCN reaction occurring within the last 10 years: No If all of the above answers are "NO", then may proceed with Cephalosporin use.   . Buprenorphine Hives  . Codeine Hives    REACTION: "makes her crazy" Hives  . Demerol Hives  . Morphine And Related Hives  . Sulfonamide Derivatives Rash    REACTION: Rash  . Vicodin [Hydrocodone-Acetaminophen] Hives    Current Outpatient Medications on File Prior to Visit  Medication Sig Dispense Refill  . acetaminophen (TYLENOL) 500 MG tablet Take 500 mg by mouth every 6 (six) hours as needed (for pain.).     [name] Calcium Carbonate-Vitamin D (CALTRATE 600+D) 600-400 MG-UNIT per tablet Take 1 tablet by mouth 2 (two) times daily.    [name] denosumab (PROLIA) 60 MG/ML SOLN injection Inject 60 mg  into the skin every 6 (six) months. Reported on 10/16/2015    . furosemide (LASIX) 20 MG tablet TAKE 1 TABLET BY MOUTH ONCE DAILY 90 tablet 3  . furosemide (LASIX) 40 MG tablet Take 1 tablet (40 mg total) by mouth daily. 90 tablet 3  . gabapentin (NEURONTIN) 300 MG capsule Take 1  capsule (300 mg total) by mouth 3 (three) times daily as needed (for nerve pain.). 90 capsule 2  . HIZENTRA 4 GM/20ML SOLN Inject 4 g into the skin every Wednesday.     [name] ibuprofen (ADVIL,MOTRIN) 200 MG tablet Take 200-400 mg by mouth every 6 (six) hours as needed (for pain.).    [name] levothyroxine (SYNTHROID, LEVOTHROID) 100 MCG tablet TAKE 1 TABLET BY MOUTH ONCE DAILY IN THE MORNING 90 tablet 0  . lisinopril (PRINIVIL,ZESTRIL) 20 MG tablet Take 1 tablet (20 mg total) by mouth at bedtime. 90 tablet 3  . Magnesium Oxide 400 (240 Mg) MG TABS Take 1 tablet (400 mg total) by mouth daily. 30 tablet   . omeprazole (PRILOSEC) 40 MG capsule Take 1 capsule (40 mg total) by mouth daily as needed. 30 capsule 5  . potassium chloride (K-DUR) 10 MEQ tablet take 1 tablet by mouth once daily (Patient taking differently: TAKE 1 TABLET (10 MEQ) BY MOUTH ONCE DAILY IN THE MORNING.) 90 tablet 3  . traMADol (ULTRAM) 50 MG tablet Take 1 tablet (50 mg total) by mouth every 6 (six) hours as needed (FOR PAIN.). 30 tablet 0   No current facility-administered medications on file prior to visit.     BP (!) 139/93   Pulse 82   Temp 98.1 F (36.7 C) (Oral)   Resp 16   Ht _0  (1.626 m)   Wt 129 lb 12.8 oz (58.9 kg)   SpO2 98%   BMI 22.28 kg/m       Objective:   Physical Exam  General Mental Status- Alert. General Appearance- Not in acute distress.   Skin General: Color- Normal Color. Moisture- Normal Moisture.  Neck Carotid Arteries- Normal color. Moisture- Normal Moisture. No carotid bruits. No JVD.  Chest and Lung Exam Auscultation: Breath Sounds:-Normal.  Cardiovascular Auscultation:Rythm- Regular. Murmurs & Other Heart  Sounds:Auscultation of the heart reveals- No Murmurs.  Abdomen Inspection:-Inspeection Normal. Palpation/Percussion:Note:No mass. Palpation and Percussion of the abdomen reveal- Non Tender, Non Distended + BS, no rebound or guarding.  Neurologic Cranial Nerve exam:- CN III-XII intact(No nystagmus), symmetric smile. Strength:- 5/5 equal and symmetric strength both upper and lower extremities.      Assessment & Plan:  Your one year presentation of loose stools after eating seems probably consistent with IBS-D.  I do want you to go ahead and get stool panel kit and turn it in as soon as possible.  On review of your history past colonoscopy report stated negative.  I am going to provide you with bentyl to see if this helps.  Your weight loss appears to be gradual over the last 2 years. You are not former smoke and negative colonoscopy 10 years ago per comment note. Some blood in urine in past so we repeated that study today. Urine was clear. I do want you to follow up with your pcp in 2-3 weeks to discuss weight loss and determined extent your pcp might want to work this up. Some weight loss may be due to decreased caloric intake and muscle atrophy.  Discussed choice of med with pharmacist. Rx advisement given on Bentyl.  Mackie Pai, PA-C

## 2018-01-27 ENCOUNTER — Other Ambulatory Visit: Payer: Medicare Other

## 2018-01-27 DIAGNOSIS — R197 Diarrhea, unspecified: Secondary | ICD-10-CM | POA: Diagnosis not present

## 2018-01-28 LAB — URINE CULTURE
MICRO NUMBER:: 90905494
SPECIMEN QUALITY:: ADEQUATE

## 2018-01-31 ENCOUNTER — Ambulatory Visit: Payer: Medicare Other | Admitting: Family

## 2018-01-31 LAB — GASTROINTESTINAL PATHOGEN PANEL PCR
C. DIFFICILE TOX A/B, PCR: NOT DETECTED
CAMPYLOBACTER, PCR: NOT DETECTED
CRYPTOSPORIDIUM, PCR: NOT DETECTED
E COLI (STEC) STX1/STX2, PCR: NOT DETECTED
E coli (ETEC) LT/ST PCR: NOT DETECTED
E coli 0157, PCR: NOT DETECTED
Giardia lamblia, PCR: NOT DETECTED
NOROVIRUS, PCR: NOT DETECTED
ROTAVIRUS, PCR: NOT DETECTED
SALMONELLA, PCR: NOT DETECTED
Shigella, PCR: NOT DETECTED

## 2018-02-01 ENCOUNTER — Other Ambulatory Visit: Payer: Self-pay | Admitting: Family

## 2018-02-02 NOTE — Addendum Note (Signed)
Addended by: [name] on: 02/02/2018 01:11 PM   Modules accepted: Orders

## 2018-02-02 NOTE — Progress Notes (Signed)
Will refer to GI for further evaluation of her chronic diarrhea.

## 2018-02-04 ENCOUNTER — Encounter: Payer: Self-pay | Admitting: Gastroenterology

## 2018-02-08 ENCOUNTER — Telehealth: Payer: Self-pay | Admitting: Cardiology

## 2018-02-08 ENCOUNTER — Other Ambulatory Visit: Payer: Self-pay | Admitting: Family

## 2018-02-08 NOTE — Telephone Encounter (Signed)
New Message        Patient c/o Palpitations:  High priority if patient c/o lightheadedness, shortness of breath, or chest pain  1) How long have you had palpitations/irregular HR/ Afib? Are you having the symptoms now? For sometime now  2) Are you currently experiencing lightheadedness, SOB or CP? No  3) Do you have a history of afib (atrial fibrillation) or irregular heart rhythm? No  4) Have you checked your BP or HR? (document readings if available): No  5) Are you experiencing any other symptoms? No                 Patient is asking that Dr. Curt Bears prescribe Xanax for her palpitations, she was told by Dr. Curt Bears that it could help her palpations. Or              anything else that would help. Patient has D/C lasix and potassium because there is no more swelling. Patient wants to stop the          magnesium.

## 2018-02-08 NOTE — Telephone Encounter (Signed)
OK to stop Magnesium if she is no longer taking furosemide. OK to give #30 alprazolam one prn TID, no RF. MCr

## 2018-02-08 NOTE — Telephone Encounter (Signed)
Pt is wanting to know if she may get Xanax to help with palpitations per pt Dr Sallyanne Kuster this may help Will forward to Dr Sallyanne Kuster for review .Adonis Housekeeper

## 2018-02-10 ENCOUNTER — Other Ambulatory Visit: Payer: Self-pay | Admitting: *Deleted

## 2018-02-10 MED ORDER — ALPRAZOLAM 0.25 MG PO TABS: 0.2500 mg | ORAL_TABLET | Freq: Three times a day (TID) | ORAL | 0 refills | Status: DC | PRN

## 2018-02-10 NOTE — Telephone Encounter (Signed)
Per pt has stopped Furosemide Informed to stop Magnesium as well as K dur Gen Xanax script sent to [facility] via Epic pt aware ./cy

## 2018-02-15 ENCOUNTER — Other Ambulatory Visit: Payer: Self-pay | Admitting: Family

## 2018-02-24 ENCOUNTER — Encounter: Payer: Self-pay | Admitting: Gastroenterology

## 2018-02-24 ENCOUNTER — Ambulatory Visit (INDEPENDENT_AMBULATORY_CARE_PROVIDER_SITE_OTHER): Payer: Medicare Other | Admitting: Gastroenterology

## 2018-02-24 ENCOUNTER — Encounter (INDEPENDENT_AMBULATORY_CARE_PROVIDER_SITE_OTHER): Payer: Self-pay

## 2018-02-24 VITALS — BP 140/92 | HR 76 | Ht 63.0 in | Wt 130.6 lb

## 2018-02-24 DIAGNOSIS — K529 Noninfective gastroenteritis and colitis, unspecified: Secondary | ICD-10-CM | POA: Diagnosis not present

## 2018-02-24 MED ORDER — PEG 3350-KCL-NABCB-NACL-NASULF 236 G PO SOLR: 4000.0000 mL | Freq: Once | ORAL | 0 refills | Status: AC

## 2018-02-24 NOTE — Patient Instructions (Addendum)
Use Florastor or VSL #3 probiotic once daily.   You have been scheduled for a colonoscopy. Please follow written instructions given to you at your visit today.  Please pick up your prep supplies at the pharmacy within the next 1-3 days. If you use inhalers (even only as needed), please bring them with you on the day of your procedure. Your physician has requested that you go to www.[email or web address].[email or web address] and enter the access code given to you at your visit today. This web site gives a general overview about your procedure. However, you should still follow specific instructions given to you by our office regarding your preparation for the procedure.

## 2018-02-24 NOTE — Progress Notes (Signed)
02/24/2018 [name] [name] [id number] [birth year]   HISTORY OF PRESENT ILLNESS:  This is an 83 year old female who is a patient of Dr. Ardis Hughs from one visit back in 2013.  She was previously a patient at [facility] and it appears that last colonoscopy was 11/2006 at which time she was found to have internal hemorrhoids and diverticulosis only.  Anyway, she presents here today at the request of her her PCP, [name], NP, for evaluation regarding diarrhea.  She tells me that she's had diarrhea for the past 2 years.  She describes 2-3 unformed stools per day.  Denies abdominal pain or seeing blood in her stools.  Says that BM's occur after eating so if she does not eat then she does not have diarrhea.  Cannot identify any particular foods that cause the issue.  She was placed on Bentyl 10 mg four times daily and she says that it helps and she will have solid stools, but she is worried about underlying issue/cause.  Stool studies were negative.  Says that she's never had any GI issues like this in the past.   Past Medical History:  Diagnosis Date  . Anemia    pt denies  . Arthritis   . Atrial fibrillation ([place])   . Cataract    bil  . Cystocele    history with rectocele, pt denies  . Dysrhythmia   . GERD (gastroesophageal reflux disease)   . H/O hiatal hernia   . Hemorrhoid   . History of pancreatitis    pt denies and denies as of today 08/09/2017  . Hypertension   . Hypothyroidism   . Non Hodgkin's lymphoma ([place])    dx 2012 and now in remission  . Osteoporosis   . Pacemaker 09/15/2011   Medtronic Revo  . Pericarditis   . Pleural effusion, bilateral    history of  . Pulmonary nodules    history of  . Scoliosis   . Varicose vein    Past Surgical History:  Procedure Laterality Date  . BREAST SURGERY  07/30/01   biopsy x 3, bilateral  . CATARACT EXTRACTION Bilateral   . COLONOSCOPY    . MASS EXCISION Right 10/28/2012   Procedure: Removal of mass on neck       ;  Surgeon:  Adin Hector, MD;  Location: [facility];  Service: General;  Laterality: Right;  . PARTIAL HYSTERECTOMY  04/29/2004  . PERMANENT PACEMAKER INSERTION N/A 09/15/2011   Procedure: PERMANENT PACEMAKER INSERTION;  Surgeon: Sanda Klein, MD;  Location: [facility] [place] [facility] CATH LAB;  Service: Cardiovascular;  Laterality: N/A;  . RADIOACTIVE SEED GUIDED EXCISIONAL BREAST BIOPSY Left 08/11/2017   Procedure: LEFT RADIOACTIVE SEED GUIDED EXCISIONAL BREAST BIOPSY ERAS PATHWAY;  Surgeon: Stark Klein, MD;  Location: [facility];  Service: General;  Laterality: Left;  . TONSILLECTOMY      reports that she has never smoked. She has never used smokeless tobacco. She reports that she drinks alcohol. She reports that she does not use drugs. family history includes Breast cancer in her sister; Heart disease in her brother and daughter; Lymphoma in her daughter; Stroke in her father and mother. Allergies  Allergen Reactions  . Amoxicillin Hives    Has patient had a PCN reaction causing immediate rash, facial/tongue/throat swelling, SOB or lightheadedness with hypotension: No Has patient had a PCN reaction causing severe rash involving mucus membranes or skin necrosis: No Has patient had a PCN reaction that required hospitalization: No Has patient  had a PCN reaction occurring within the last 10 years: No If all of the above answers are "NO", then may proceed with Cephalosporin use.   . Buprenorphine Hives  . Codeine Hives    REACTION: "makes her crazy" Hives  . Demerol Hives  . Morphine And Related Hives  . Sulfonamide Derivatives Rash    REACTION: Rash  . Vicodin [Hydrocodone-Acetaminophen] Hives      Outpatient Encounter Medications as of 02/24/2018  Medication Sig  . acetaminophen (TYLENOL) 500 MG tablet Take 500 mg by mouth every 6 (six) hours as needed (for pain.).   [name] ALPRAZolam (XANAX) 0.25 MG tablet Take 1 tablet (0.25 mg total) by mouth 3 (three) times daily as needed.  . Calcium Carbonate-Vitamin D (CALTRATE 600+D)  600-400 MG-UNIT per tablet Take 1 tablet by mouth 2 (two) times daily.  [name] denosumab (PROLIA) 60 MG/ML SOLN injection Inject 60 mg into the skin every 6 (six) months. Reported on 10/16/2015  . dicyclomine (BENTYL) 10 MG capsule Take 1 capsule (10 mg total) by mouth 4 (four) times daily -  before meals and at bedtime.  . gabapentin (NEURONTIN) 300 MG capsule TAKE 1 CAPSULE(300 MG) BY MOUTH THREE TIMES DAILY AS NEEDED FOR NERVE PAIN  . HIZENTRA 4 GM/20ML SOLN Inject 4 g into the skin every Wednesday.   [name] ibuprofen (ADVIL,MOTRIN) 200 MG tablet Take 200-400 mg by mouth every 6 (six) hours as needed (for pain.).  [name] levothyroxine (SYNTHROID, LEVOTHROID) 100 MCG tablet TAKE 1 TABLET BY MOUTH ONCE DAILY IN THE MORNING  . lisinopril (PRINIVIL,ZESTRIL) 20 MG tablet Take 1 tablet (20 mg total) by mouth at bedtime.  [name] omeprazole (PRILOSEC) 40 MG capsule TAKE 1 CAPSULE BY MOUTH ONCE DAILY  . traMADol (ULTRAM) 50 MG tablet Take 1 tablet (50 mg total) by mouth every 6 (six) hours as needed (FOR PAIN.). (Patient not taking: Reported on 02/24/2018)   No facility-administered encounter medications on file as of 02/24/2018.      REVIEW OF SYSTEMS  : All other systems reviewed and negative except where noted in the History of Present Illness.   PHYSICAL EXAM: BP (!) 140/92   Pulse 76   Ht 5\' 3"  (1.6 m)   Wt 130 lb 9.6 oz (59.2 kg)   BMI 23.13 kg/m  General: Well developed white female in no acute distress Head: Normocephalic and atraumatic Eyes:  Sclerae anicteric, conjunctiva pink. Ears: Normal auditory acuity Lungs: Clear throughout to auscultation; no increased WOB. Heart: Regular rate and rhythm; no M/R/G. Abdomen: Soft, non-distended.  BS present.  Non-tender. Rectal:  Will be done at the time of colonoscopy. Musculoskeletal: Symmetrical with no gross deformities  Skin: No lesions on visible extremities Extremities: No edema  Neurological: Alert oriented x 4, grossly non-focal Psychological:  Alert  and cooperative. Normal mood and affect  ASSESSMENT AND PLAN: *83 year old female with diarrhea for the past 2 years:  Improved/resolves with using Bentyl, but she is concerned about any underlying issue.  Last colonoscopy was 11/2006.  ? Microscopic colitis.  ? Just IBS.  Stool studies negative.  Will schedule for colonoscopy with Dr. Ardis Hughs.  Continue Bentyl for now.  Consider daily probiotic such as Florastor or VSL #3.  **The risks, benefits, and alternatives to colonoscopy were discussed with the patient and she consents to proceed.   CC:  [name], NP

## 2018-02-25 NOTE — Progress Notes (Signed)
I agree with the above note, plan 

## 2018-03-03 ENCOUNTER — Telehealth: Payer: Self-pay | Admitting: Cardiovascular Disease

## 2018-03-03 DIAGNOSIS — I10 Essential (primary) hypertension: Secondary | ICD-10-CM

## 2018-03-03 MED ORDER — CHLORTHALIDONE 25 MG PO TABS: ORAL_TABLET | ORAL | 6 refills | Status: DC

## 2018-03-03 NOTE — Telephone Encounter (Signed)
Returned call to patient she stated B/P has been elevated the past 10 days.B/P's listed below.Pulse 78-89.Stated she is taking Lisinopril 10 mg daily.Advised I will send message to our pharmacist for advice.

## 2018-03-03 NOTE — Telephone Encounter (Signed)
Chart indicates lisinopril is 20 mg, patient notes taking 10 mg daily.  If truly on 10 mg daily have her increase to 20 mg daily and continue with home BP checks.  If she is really on 20 mg daily, have her add chlorthalidone 12.5 mg (1/2 of 25 mg tab) daily.  Either way, have her repeat BMET in 2 weeks and follow up with CVRR in 3-4 weeks.

## 2018-03-03 NOTE — Telephone Encounter (Signed)
Returned call to patient.She checked her lisinopril bottle.She is taking 20 mg daily.Krisitn advised to continue and add chlorthalidone 25 mg 1/2 tablet daily.Bmet in 2 weeks.Scheduler will call you back to schedule HTN clinic appointment in 3 to 4 weeks.  Patient stated she has been having palpitations at night.Stated PCP stopped Alprazolam.Stated she use to take 0.5 mg and would like to ask Dr.Croitoru to prescribe Alprazolam 0.5 mg.Advised I will send message to Dr.Croitoru.

## 2018-03-03 NOTE — Telephone Encounter (Signed)
New Message     Pt c/o BP issue: STAT if pt c/o blurred vision, one-sided weakness or slurred speech  1. What are your last 5 BP readings? 166/117 158/118 169/120 169/121 169/122  2. Are you having any other symptoms (ex. Dizziness, headache, blurred vision, passed out)? Headache/Heart palpitations.  3. What is your BP issue? BP is up

## 2018-03-04 MED ORDER — ALPRAZOLAM 0.5 MG PO TABS: 0.5000 mg | ORAL_TABLET | Freq: Three times a day (TID) | ORAL | 0 refills | Status: DC | PRN

## 2018-03-04 NOTE — Telephone Encounter (Signed)
Called patient left message on personal voice mail Alprazolam 0.5 mg refill called to pharmacy.

## 2018-03-04 NOTE — Telephone Encounter (Signed)
Ok to give alprazolam 0.5 mg TID as needed for anxiety. # 30, no RF

## 2018-03-15 ENCOUNTER — Telehealth: Payer: Self-pay | Admitting: Cardiology

## 2018-03-15 ENCOUNTER — Ambulatory Visit (INDEPENDENT_AMBULATORY_CARE_PROVIDER_SITE_OTHER): Payer: Medicare Other | Admitting: *Deleted

## 2018-03-15 DIAGNOSIS — I495 Sick sinus syndrome: Secondary | ICD-10-CM | POA: Diagnosis not present

## 2018-03-15 NOTE — Telephone Encounter (Signed)
Spoke with pt and reminded pt of remote transmission that is due today. Pt verbalized understanding.   

## 2018-03-16 NOTE — Progress Notes (Signed)
Remote pacemaker transmission.   

## 2018-03-23 ENCOUNTER — Encounter: Payer: Self-pay | Admitting: Gastroenterology

## 2018-03-31 ENCOUNTER — Other Ambulatory Visit: Payer: Self-pay | Admitting: Family

## 2018-04-06 ENCOUNTER — Encounter: Payer: Medicare Other | Admitting: Gastroenterology

## 2018-04-06 LAB — CUP PACEART REMOTE DEVICE CHECK
Date Time Interrogation Session: 20190917212004
Implantable Lead Implant Date: 20130319
Implantable Lead Implant Date: 20130319
Implantable Lead Location: 753859
Implantable Pulse Generator Implant Date: 20130319
Lead Channel Impedance Value: 408 Ohm
Lead Channel Impedance Value: 512 Ohm
Lead Channel Sensing Intrinsic Amplitude: 1.064 mV
Lead Channel Sensing Intrinsic Amplitude: 15.009 mV
Lead Channel Setting Pacing Amplitude: 2 V
Lead Channel Setting Sensing Sensitivity: 0.9 mV
MDC IDC LEAD LOCATION: 753860
MDC IDC MSMT BATTERY VOLTAGE: 2.95 V
MDC IDC STAT BRADY RA PERCENT PACED: 82.14 %

## 2018-04-16 ENCOUNTER — Other Ambulatory Visit: Payer: Self-pay | Admitting: Cardiovascular Disease

## 2018-05-13 ENCOUNTER — Telehealth: Payer: Self-pay | Admitting: Family

## 2018-05-13 NOTE — Telephone Encounter (Signed)
Attempted to call patient to schedule AWV, but patient is unavailable at this time and would like a cal back at a later time. SF

## 2018-05-30 ENCOUNTER — Telehealth: Payer: Self-pay | Admitting: Cardiovascular Disease

## 2018-05-30 NOTE — Telephone Encounter (Signed)
Called patient, she advised that since the weekend she has had some serious issues with palpitations. She states they are worse at night, she denies checking her BP today, but states yesterday it was 152/90, she denies SOB, swelling and sharp chest pains. She was given the opportunity to make an appointment to have EKG completed to check on the palpitations. Patient verbalized understanding, also advised patient that if she began having arm pain, with severe sharp chest pains to go to the ER. Patient schedule to see [name] on 06/02/18 at 11:00

## 2018-05-30 NOTE — Telephone Encounter (Signed)
New message   Patient c/o Palpitations:  High priority if patient c/o lightheadedness, shortness of breath, or chest pain  1) How long have you had palpitations/irregular HR/ Afib? Are you having the symptoms now? Over a year ago, no   2) Are you currently experiencing lightheadedness, SOB or CP? No   3) Do you have a history of afib (atrial fibrillation) or irregular heart rhythm? Yes   4) Have you checked your BP or HR? (document readings if available): no   5) Are you experiencing any other symptoms? No

## 2018-06-02 ENCOUNTER — Ambulatory Visit: Payer: Medicare Other | Admitting: Physician Assistant

## 2018-06-02 ENCOUNTER — Encounter: Payer: Self-pay | Admitting: Physician Assistant

## 2018-06-02 VITALS — BP 152/92 | HR 71 | Ht 64.0 in | Wt 132.0 lb

## 2018-06-02 DIAGNOSIS — I10 Essential (primary) hypertension: Secondary | ICD-10-CM

## 2018-06-02 DIAGNOSIS — R002 Palpitations: Secondary | ICD-10-CM

## 2018-06-02 DIAGNOSIS — E785 Hyperlipidemia, unspecified: Secondary | ICD-10-CM | POA: Diagnosis not present

## 2018-06-02 DIAGNOSIS — I5032 Chronic diastolic (congestive) heart failure: Secondary | ICD-10-CM

## 2018-06-02 DIAGNOSIS — Z950 Presence of cardiac pacemaker: Secondary | ICD-10-CM

## 2018-06-02 NOTE — Progress Notes (Signed)
Cardiology Office Note   Date:  06/02/2018   ID:  [name], [name] Nov 21, 1934, MRN [id number]  PCP:  [name], NP Cardiologist:  Sanda Klein, MD 01/13/2018 Erica Ferries, PA-C   Chief Complaint  Patient presents with  . Palpitations    History of Present Illness: [name] is a 83 y.o. female with a history of SSS s/p MDT Revo 2013 (programmed AAIR to avoid ventricular paced beats), non-Hodgkin's lymphoma in remission  01/13/2018 office visit, diltiazem stopped and diuretics added>> edema resolved.  Getting IVIG monthly 12/2 phone note regarding palpitations, appointment made  [name] presents for cardiology follow up.  She has been having palpitations for about a year.   LE edema improved on Lasix 40 mg. She gradually cut back on the Lasix and d/c'd both the Lasix and the Kdur.   The LE edema has not returned.  She drinks 3/2 L bottles of water a day, not much else.  She gets palpitations every night. She will feel her heart "stop" and then start back with a jerk. It will pound, then settle down. She will then get lesser palpitations, they will gradually resolve. It does not happen when she first lays down. It has woken her, but does not usually do that.   She has noticed the palpitations more often at night recently and had an episode during the day.  That is why she is more concerned about the palpitations.  She feels the Xanax helped her, but she ran out. Wonders if she should get more.  Takes low-dose Bendadryl qhs, but cannot increase it, makes her sleepy the next day. Melatonin used in the past, no help.   Her activity level is normal. She can climb stairs without stopping. Has a stair chair she put in for her husband, uses that when she feels light-headed. She only takes the gabapentin at night.  She feels light-headed when she first gets up, and will feel it when she gets up after sitting a long time.   She rarely takes the  Tramadol, last time was Thanksgiving. She took 1/2 tab and was sleepy all day.   Last pm, she took her BP, remembers 90/60. However, BP generally runs high.    Past Medical History:  Diagnosis Date  . Anemia    pt denies  . Arthritis   . Atrial fibrillation ([place])   . Cataract    bil  . Cystocele    history with rectocele, pt denies  . Dysrhythmia   . GERD (gastroesophageal reflux disease)   . H/O hiatal hernia   . Hemorrhoid   . History of pancreatitis    pt denies and denies as of today 08/09/2017  . Hypertension   . Hypothyroidism   . Non Hodgkin's lymphoma ([place])    dx 2012 and now in remission  . Osteoporosis   . Pacemaker 09/15/2011   Medtronic Revo  . Pericarditis   . Pleural effusion, bilateral    history of  . Pulmonary nodules    history of  . Scoliosis   . Varicose vein     Past Surgical History:  Procedure Laterality Date  . BREAST SURGERY  07/30/01   biopsy x 3, bilateral  . CATARACT EXTRACTION Bilateral   . COLONOSCOPY    . MASS EXCISION Right 10/28/2012   Procedure: Removal of mass on neck       ;  Surgeon: Adin Hector, MD;  Location: [facility];  Service:  General;  Laterality: Right;  . PARTIAL HYSTERECTOMY  04/29/2004  . PERMANENT PACEMAKER INSERTION N/A 09/15/2011   Procedure: PERMANENT PACEMAKER INSERTION;  Surgeon: Sanda Klein, MD;  Location: [facility] CATH LAB;  Service: Cardiovascular;  Laterality: N/A;  . RADIOACTIVE SEED GUIDED EXCISIONAL BREAST BIOPSY Left 08/11/2017   Procedure: LEFT RADIOACTIVE SEED GUIDED EXCISIONAL BREAST BIOPSY ERAS PATHWAY;  Surgeon: Stark Klein, MD;  Location: [facility];  Service: General;  Laterality: Left;  . TONSILLECTOMY      Current Outpatient Medications  Medication Sig Dispense Refill  . acetaminophen (TYLENOL) 500 MG tablet Take 500 mg by mouth every 6 (six) hours as needed (for pain.).     [name] ALPRAZolam (XANAX) 0.5 MG tablet Take 1 tablet (0.5 mg total) by mouth 3 (three) times daily as needed for anxiety. 30 tablet 0  .  Calcium Carbonate-Vitamin D (CALTRATE 600+D) 600-400 MG-UNIT per tablet Take 1 tablet by mouth 2 (two) times daily.    [name] denosumab (PROLIA) 60 MG/ML SOLN injection Inject 60 mg into the skin every 6 (six) months. Reported on 10/16/2015    . gabapentin (NEURONTIN) 300 MG capsule TAKE 1 CAPSULE(300 MG) BY MOUTH THREE TIMES DAILY AS NEEDED FOR NERVE PAIN 90 capsule 5  . HIZENTRA 4 GM/20ML SOLN Inject 4 g into the skin every Wednesday.     [name] ibuprofen (ADVIL,MOTRIN) 200 MG tablet Take 200-400 mg by mouth every 6 (six) hours as needed (for pain.).    [name] levothyroxine (SYNTHROID, LEVOTHROID) 100 MCG tablet TAKE 1 TABLET BY MOUTH ONCE DAILY IN THE MORNING 90 tablet 0  . omeprazole (PRILOSEC) 40 MG capsule TAKE 1 CAPSULE BY MOUTH ONCE DAILY 30 capsule 5  . traMADol (ULTRAM) 50 MG tablet Take 1 tablet (50 mg total) by mouth every 6 (six) hours as needed (FOR PAIN.). 30 tablet 0  . lisinopril (PRINIVIL,ZESTRIL) 20 MG tablet Take 1 tablet (20 mg total) by mouth at bedtime. 90 tablet 3   No current facility-administered medications for this visit.     Allergies:   Amoxicillin; Buprenorphine; Codeine; Demerol; Morphine and related; Sulfonamide derivatives; and Vicodin [hydrocodone-acetaminophen]    Social History:  The patient  reports that she has never smoked. She has never used smokeless tobacco. She reports that she drinks alcohol. She reports that she does not use drugs.   Family History:  The patient's family history includes Breast cancer in her sister; Heart disease in her brother and daughter; Lymphoma in her daughter; Stroke in her father and mother.  She indicated that her mother is deceased. She indicated that her father is deceased. She indicated that two of her three sisters are alive. She indicated that only one of her two brothers is alive. She indicated that her maternal grandmother is deceased. She indicated that her maternal grandfather is deceased. She indicated that her paternal grandmother  is deceased. She indicated that her paternal grandfather is deceased. She indicated that the status of her neg hx is unknown.    ROS:  Please see the history of present illness. All other systems are reviewed and negative.    PHYSICAL EXAM: VS:  BP (!) 152/92   Pulse 71   Ht 5\' 4"  (1.626 m)   Wt 132 lb (59.9 kg)   BMI 22.66 kg/m  , BMI Body mass index is 22.66 kg/m. GEN: Well nourished, well developed, female in no acute distress HEENT: normal for age  Neck: JVD 9 cm, no carotid bruit, no masses Cardiac: RRR; soft murmur, no rubs,  or gallops Respiratory: Slightly decreased breath sounds bases bilaterally, normal work of breathing GI: soft, nontender, nondistended, + BS MS: Kyphoscoliosis noted, no atrophy; no edema; distal pulses are 2+ in all 4 extremities  Skin: warm and dry, no rash Neuro:  Strength and sensation are intact Psych: euthymic mood, full affect   EKG:  EKG is ordered today. The ekg ordered today demonstrates a paced with a heart rate is 71, no acute ischemic changes  ECHO: 04/28/2016 - Left ventricle: The cavity size was normal. Systolic function was   normal. The estimated ejection fraction was in the range of 60%   to 65%. Wall motion was normal; there were no regional wall   motion abnormalities. Features are consistent with a pseudonormal   left ventricular filling pattern, with concomitant abnormal   relaxation and increased filling pressure (grade 2 diastolic   dysfunction). Doppler parameters are consistent with high   ventricular filling pressure. - Aortic valve: Trileaflet; normal thickness, mildly calcified   leaflets. - Pulmonary arteries: PA peak pressure: 45 mm Hg (S).  Impressions:  - The right ventricular systolic pressure was increased consistent   with moderate pulmonary hypertension.   Recent Labs: 10/26/2017: Magnesium 2.1; TSH 0.69 12/21/2017: ALT 13; BUN 14; Creatinine, Ser 0.76; Hemoglobin 13.0; Platelets 242; Potassium 3.9;  Sodium 142  CBC    Component Value Date/Time   WBC 5.7 12/21/2017 1032   RBC 4.17 12/21/2017 1032   HGB 13.0 12/21/2017 1032   HGB 15.5 06/16/2017 0921   HCT 38.9 12/21/2017 1032   HCT 46.4 06/16/2017 0921   PLT 242 12/21/2017 1032   PLT 198 06/16/2017 0921   PLT 239 04/07/2017 1114   MCV 93.2 12/21/2017 1032   MCV 96.1 06/16/2017 0921   MCH 31.2 12/21/2017 1032   MCHC 33.5 12/21/2017 1032   RDW 13.9 12/21/2017 1032   RDW 13.7 06/16/2017 0921   LYMPHSABS 2.1 12/21/2017 1032   LYMPHSABS 1.7 06/16/2017 0921   MONOABS 0.4 12/21/2017 1032   MONOABS 0.7 06/16/2017 0921   EOSABS 0.2 12/21/2017 1032   EOSABS 0.1 06/16/2017 0921   BASOSABS 0.1 12/21/2017 1032   BASOSABS 0.1 06/16/2017 0921   CMP Latest Ref Rng & Units 12/21/2017 11/26/2017 10/26/2017  Glucose 70 - 99 mg/dL 82 75 82  BUN 8 - 23 mg/dL 14 18 13   Creatinine 0.44 - 1.00 mg/dL 0.76 0.79 0.74  Sodium 135 - 145 mmol/L 142 143 142  Potassium 3.5 - 5.1 mmol/L 3.9 4.3 3.9  Chloride 98 - 111 mmol/L 107 104 102  CO2 22 - 32 mmol/L 26 24 30   Calcium 8.9 - 10.3 mg/dL 9.5 9.8 9.9  Total Protein 6.5 - 8.1 g/dL 6.9 - -  Total Bilirubin 0.3 - 1.2 mg/dL 0.4 - -  Alkaline Phos 38 - 126 U/L 63 - -  AST 15 - 41 U/L 24 - -  ALT 0 - 44 U/L 13 - -     Lipid Panel Lab Results  Component Value Date   CHOL 232 (H) 10/26/2017   HDL 46.60 10/26/2017   LDLCALC 147 (H) 10/26/2017   TRIG 196.0 (H) 10/26/2017   CHOLHDL 5 10/26/2017      Wt Readings from Last 3 Encounters:  06/02/18 132 lb (59.9 kg)  02/24/18 130 lb 9.6 oz (59.2 kg)  01/26/18 129 lb 12.8 oz (58.9 kg)     Other studies Reviewed: Additional studies/ records that were reviewed today include: Office notes, hospital records and testing.  ASSESSMENT AND  PLAN:  1.  Palpitations, history of Medtronic pacemaker:  -Her device was interrogated today, I will review the interrogation with Dr. Sallyanne Kuster and see if her reason is found for her palpitations. -She is encouraged  to stay hydrated, her heart may be beating faster because of exertion and she notices it more at night when it is otherwise quiet. -She may also be getting occasional PVCs that cause her to feel like her heart is stopping.  2. Hyperlipidemia:  - She admits that she was working on eating a low cholesterol diet, but she lost weight because of treatment for the lymphoma. - After she lost so much weight, she states she has been eating "everything in sight" to try and put weight on. - She agrees to try to eat a lower cholesterol diet, prefers this to trying medications right now.  3.  Anxiety: - She states that the Xanax helped her palpitations at night.  She states she did not take it during the day, just sometimes at bedtime. -Discussed with Dr. Sallyanne Kuster if we should refill this or ask her to follow-up with PCP  4.  Hypertension: -According to the patient, her blood pressure runs up much of the time but she has had some recent readings less than 829 systolic.  She is requested to take her blood pressure once a day at various times and record this.  Let us know the results, we may need to change her medications.  Her lisinopril could easily be increased.  5.  Chronic diastolic CHF: Her neck veins are up slightly, but otherwise no volume overload by exam. - She is currently not on a diuretic, will will not start one now. - Because of orthostatic dizziness and lightheadedness, I asked her to drink a little more water, contact us if she starts getting lower extremity edema again.   Current medicines are reviewed at length with the patient today.  The patient has concerns regarding medicines.  Concerns were addressed  The following changes have been made:  no change  Labs/ tests ordered today include:   Orders Placed This Encounter  Procedures  . EKG 12-Lead     Disposition:   FU with Sanda Klein, MD  Signed, Erica Ferries, PA-C  06/02/2018 2:16 PM    [name] Phone: [phone]; Fax: [phone]

## 2018-06-02 NOTE — Patient Instructions (Signed)
Medication Instructions:  The current medical regimen is effective;  continue present plan and medications.  If you need a refill on your cardiac medications before your next appointment, please call your pharmacy.   Lab work: None Ordered. If you have labs (blood work) drawn today and your tests are completely normal, you will receive your results only by: [name] MyChart Message (if you have MyChart) OR . A paper copy in the mail If you have any lab test that is abnormal or we need to change your treatment, we will call you to review the results.  Testing/Procedures: None Ordered.  Follow-Up: At [facility], you and your health needs are our priority.  As part of our continuing mission to provide you with exceptional heart care, we have created designated Provider Care Teams.  These Care Teams include your primary Cardiologist (physician) and Advanced Practice Providers (APPs -  Physician Assistants and Nurse Practitioners) who all work together to provide you with the care you need, when you need it. You will need a follow up appointment with first available. You may see Sanda Klein, MD or one of the following Advanced Practice Providers on your designated Care Team: [place], [place] . Fabian Sharp, PA-C  Any Other Special Instructions Will Be Listed Below (If Applicable). Drink 4 bottle of water daily. Continue a low cholesterol diet If BP is below 100 drink more water. Keep a BP log, take daily at different times.

## 2018-06-02 NOTE — Progress Notes (Signed)
Pacemaker download shows normal device function.  She has not had any recorded arrhythmia since September when she had a couple of episodes of brief paroxysmal atrial tachycardia with 1: 1 AV conduction lasting for about 8 beats each time.  No recent arrhythmia recorded to explain her palpitations, suspect these are PACs and/or PVCs.  Treat as necessary for symptom relief only. Thanks, [employer]

## 2018-06-03 ENCOUNTER — Telehealth: Payer: Self-pay

## 2018-06-03 MED ORDER — LISINOPRIL 20 MG PO TABS: 20.0000 mg | ORAL_TABLET | Freq: Two times a day (BID) | ORAL | 2 refills | Status: DC

## 2018-06-03 MED ORDER — METOPROLOL TARTRATE 25 MG PO TABS: 12.5000 mg | ORAL_TABLET | Freq: Two times a day (BID) | ORAL | 11 refills | Status: DC | PRN

## 2018-06-03 NOTE — Telephone Encounter (Signed)
Spoke with patient by phone.  I explained the concept of palpitations being single skipped beats that are not harmful to her in any way, even though she can feel them.  I will give her metoprolol 25 mg, 1/2-1 tablet up to twice daily as needed for palpitations.  She is also concerned about her blood pressure as it was 192/102 this a.m. when she first got up.  She is compliant with lisinopril 20 mg a day.  When I reviewed her prior medications, she had been on amlodipine at one time.  However, she believes that was stopped because of lower extremity edema.  I will increase the lisinopril to 20 mg twice daily, that may help her a.m. blood pressures not be so high.  She is to continue to track her blood pressure and let us know.  She was in agreement with this as a plan of care.  [name], PA-C 06/03/2018 1:31 PM

## 2018-06-03 NOTE — Telephone Encounter (Signed)
Called patient, to advised after speaking with Suanne Marker she spoke with Dr.Croitoru and they did a pacemaker check yesterday, and it did not show anything to explain the palpitations that she is having, and it could be related to her anxiety. They advised her to go to her PCP to be monitored for her anxiety and medication monitior as well. Patient states that she does not understand why it did not show anything as she is having palpitations, I explained that sometimes the anxiety can look like these symptoms and patient states that her BP this morning was 192/102, I advised that I would speak with rhonda and notify her of this. Patient will contact PCP.

## 2018-06-09 ENCOUNTER — Ambulatory Visit: Payer: Medicare Other | Admitting: Physician Assistant

## 2018-06-09 NOTE — Progress Notes (Deleted)
Cardiology Office Note    Date:  06/09/2018   ID:  [name], DOB 02/09/1935, MRN [id number]  PCP:  [name], NP  Cardiologist: Dr. Sallyanne Kuster  No chief complaint on file.   History of Present Illness:  [name] is a 83 y.o. female with PMH of sinus node dysfunction s/p Medtronic dual-chamber PPM, non-Hodgkin's lymphoma in remission, history of atrial fibrillation, hypertension, hypothyroidism, history of pancreatitis, and history of pulmonary nodule.  Last echocardiogram obtained on 04/28/2016 showed EF 60 to 65%, grade 2 DD, PA peak pressure 45 mmHg.  He had a history of remote atrial fibrillation related to acute pericarditis which has not recurred for the past few years.  In 2018, she had episode of syncopal event shortly after arriving in [place] after a long airplane trip.  Echocardiogram obtained at the time showed a normal left ventricular systolic function, but evidence of diastolic dysfunction and increased filling pressure.  Patient was recently seen by [name], PA-C on 06/02/2018 for evaluation of palpitation for the past year.  Apparently Xanax helped her symptoms, however she ran out.  His device interrogation showed normal device function without significant irregular rhythm.  It was felt that she likely has anxiety component to her symptoms and possibly PACs and PVCs.  She was given some as needed dose of metoprolol to help control her high blood pressure and anxiety symptoms.  Lisinopril was also increased to 20 mg twice daily.  No EKG    Past Medical History:  Diagnosis Date  . Anemia    pt denies  . Arthritis   . Atrial fibrillation ([place])   . Cataract    bil  . Cystocele    history with rectocele, pt denies  . Dysrhythmia   . GERD (gastroesophageal reflux disease)   . H/O hiatal hernia   . Hemorrhoid   . History of pancreatitis    pt denies and denies as of today 08/09/2017  . Hypertension   . Hypothyroidism   . Non Hodgkin's  lymphoma ([place])    dx 2012 and now in remission  . Osteoporosis   . Pacemaker 09/15/2011   Medtronic Revo  . Pericarditis   . Pleural effusion, bilateral    history of  . Pulmonary nodules    history of  . Scoliosis   . Varicose vein     Past Surgical History:  Procedure Laterality Date  . BREAST SURGERY  07/30/01   biopsy x 3, bilateral  . CATARACT EXTRACTION Bilateral   . COLONOSCOPY    . MASS EXCISION Right 10/28/2012   Procedure: Removal of mass on neck       ;  Surgeon: Adin Hector, MD;  Location: [facility];  Service: General;  Laterality: Right;  . PARTIAL HYSTERECTOMY  04/29/2004  . PERMANENT PACEMAKER INSERTION N/A 09/15/2011   Procedure: PERMANENT PACEMAKER INSERTION;  Surgeon: Sanda Klein, MD;  Location: [facility];  Service: Cardiovascular;  Laterality: N/A;  . RADIOACTIVE SEED GUIDED EXCISIONAL BREAST BIOPSY Left 08/11/2017   Procedure: LEFT RADIOACTIVE SEED GUIDED EXCISIONAL BREAST BIOPSY ERAS PATHWAY;  Surgeon: Stark Klein, MD;  Location: [facility];  Service: General;  Laterality: Left;  . TONSILLECTOMY      Current Medications: Outpatient Medications Prior to Visit  Medication Sig Dispense Refill  . acetaminophen (TYLENOL) 500 MG tablet Take 500 mg by mouth every 6 (six) hours as needed (for pain.).     [name] ALPRAZolam (XANAX) 0.5 MG tablet Take 1 tablet (0.5  mg total) by mouth 3 (three) times daily as needed for anxiety. 30 tablet 0  . Calcium Carbonate-Vitamin D (CALTRATE 600+D) 600-400 MG-UNIT per tablet Take 1 tablet by mouth 2 (two) times daily.    [name] denosumab (PROLIA) 60 MG/ML SOLN injection Inject 60 mg into the skin every 6 (six) months. Reported on 10/16/2015    . gabapentin (NEURONTIN) 300 MG capsule TAKE 1 CAPSULE(300 MG) BY MOUTH THREE TIMES DAILY AS NEEDED FOR NERVE PAIN 90 capsule 5  . HIZENTRA 4 GM/20ML SOLN Inject 4 g into the skin every Wednesday.     [name] ibuprofen (ADVIL,MOTRIN) 200 MG tablet Take 200-400 mg by mouth every 6 (six) hours as needed (for  pain.).    [name] levothyroxine (SYNTHROID, LEVOTHROID) 100 MCG tablet TAKE 1 TABLET BY MOUTH ONCE DAILY IN THE MORNING 90 tablet 0  . lisinopril (PRINIVIL,ZESTRIL) 20 MG tablet Take 1 tablet (20 mg total) by mouth 2 (two) times daily. 60 tablet 2  . metoprolol tartrate (LOPRESSOR) 25 MG tablet Take 0.5-1 tablets (12.5-25 mg total) by mouth 2 (two) times daily as needed (Palpitations). 60 tablet 11  . omeprazole (PRILOSEC) 40 MG capsule TAKE 1 CAPSULE BY MOUTH ONCE DAILY 30 capsule 5  . traMADol (ULTRAM) 50 MG tablet Take 1 tablet (50 mg total) by mouth every 6 (six) hours as needed (FOR PAIN.). 30 tablet 0   No facility-administered medications prior to visit.      Allergies:   Amoxicillin; Buprenorphine; Codeine; Demerol; Morphine and related; Sulfonamide derivatives; and Vicodin [hydrocodone-acetaminophen]   Social History   Socioeconomic History  . Marital status: Widowed    Spouse name: Not on file  . Number of children: 3  . Years of education: Not on file  . Highest education level: Not on file  Occupational History  . Occupation: [employer]  Social Needs  . Financial resource strain: Not on file  . Food insecurity:    Worry: Not on file    Inability: Not on file  . Transportation needs:    Medical: Not on file    Non-medical: Not on file  Tobacco Use  . Smoking status: Never Smoker  . Smokeless tobacco: Never Used  Substance and Sexual Activity  . Alcohol use: Yes    Comment: 1 glass of wine occasionally  . Drug use: No  . Sexual activity: Not on file  Lifestyle  . Physical activity:    Days per week: Not on file    Minutes per session: Not on file  . Stress: Not on file  Relationships  . Social connections:    Talks on phone: Not on file    Gets together: Not on file    Attends religious service: Not on file    Active member of club or organization: Not on file    Attends meetings of clubs or organizations: Not on file    Relationship status: Not on file    Other Topics Concern  . 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  She lives alone in a 3-story home.  There is a lift chair which she does not use (placed for her husband).     Family History:  The patient's ***family history includes Breast cancer in her sister; Heart disease in her brother and daughter; Lymphoma in her daughter; Stroke in her father and mother.   ROS:   Please see the history of present illness.  ROS All other systems reviewed and are negative.   PHYSICAL EXAM:   VS:  There were no vitals taken for this visit.   GEN: Well nourished, well developed, in no acute distress HEENT: normal Neck: no JVD, carotid bruits, or masses Cardiac: ***RRR; no murmurs, rubs, or gallops,no edema  Respiratory:  clear to auscultation bilaterally, normal work of breathing GI: soft, nontender, nondistended, + BS MS: no deformity or atrophy Skin: warm and dry, no rash Neuro:  Alert and Oriented x 3, Strength and sensation are intact Psych: euthymic mood, full affect  Wt Readings from Last 3 Encounters:  06/02/18 132 lb (59.9 kg)  02/24/18 130 lb 9.6 oz (59.2 kg)  01/26/18 129 lb 12.8 oz (58.9 kg)      Studies/Labs Reviewed:   EKG:  EKG is*** ordered today.  The ekg ordered today demonstrates ***  Recent Labs: 10/26/2017: Magnesium 2.1; TSH 0.69 12/21/2017: ALT 13; BUN 14; Creatinine, Ser 0.76; Hemoglobin 13.0; Platelets 242; Potassium 3.9; Sodium 142   Lipid Panel    Component Value Date/Time   CHOL 232 (H) 10/26/2017 1035   TRIG 196.0 (H) 10/26/2017 1035   HDL 46.60 10/26/2017 1035   CHOLHDL 5 10/26/2017 1035   VLDL 39.2 10/26/2017 1035   LDLCALC 147 (H) 10/26/2017 1035    Additional studies/ records that were reviewed today include:   Echo 04/10/2016 LV EF: 60% -   65% Study Conclusions  - Left ventricle: The cavity size was normal. Systolic function was   normal. The  estimated ejection fraction was in the range of 60%   to 65%. Wall motion was normal; there were no regional wall   motion abnormalities. Features are consistent with a pseudonormal   left ventricular filling pattern, with concomitant abnormal   relaxation and increased filling pressure (grade 2 diastolic   dysfunction). Doppler parameters are consistent with high   ventricular filling pressure. - Aortic valve: Trileaflet; normal thickness, mildly calcified   leaflets. - Pulmonary arteries: PA peak pressure: 45 mm Hg (S).  Impressions:  - The right ventricular systolic pressure was increased consistent   with moderate pulmonary hypertension.     ASSESSMENT:    No diagnosis found.   PLAN:  In order of problems listed above:  1. ***    Medication Adjustments/Labs and Tests Ordered: Current medicines are reviewed at length with the patient today.  Concerns regarding medicines are outlined above.  Medication changes, Labs and Tests ordered today are listed in the Patient Instructions below. There are no Patient Instructions on file for this visit.   Hilbert Corrigan, [place]  06/09/2018 9:16 AM    [address], [place], [place]  [address] Phone: [phone]; Fax: [phone]

## 2018-06-13 ENCOUNTER — Inpatient Hospital Stay: Payer: Medicare Other | Attending: Internal Medicine

## 2018-06-13 ENCOUNTER — Encounter (HOSPITAL_COMMUNITY): Payer: Self-pay

## 2018-06-13 ENCOUNTER — Ambulatory Visit (HOSPITAL_COMMUNITY)
Admission: RE | Admit: 2018-06-13 | Discharge: 2018-06-13 | Disposition: A | Discharge status: Home / Self Care | Payer: Medicare Other | Source: Ambulatory Visit | Attending: Internal Medicine | Admitting: Internal Medicine

## 2018-06-13 DIAGNOSIS — C8221 Follicular lymphoma grade III, unspecified, lymph nodes of head, face, and neck: Secondary | ICD-10-CM

## 2018-06-13 DIAGNOSIS — I10 Essential (primary) hypertension: Secondary | ICD-10-CM | POA: Insufficient documentation

## 2018-06-13 DIAGNOSIS — Z79899 Other long term (current) drug therapy: Secondary | ICD-10-CM | POA: Diagnosis not present

## 2018-06-13 DIAGNOSIS — Z9221 Personal history of antineoplastic chemotherapy: Secondary | ICD-10-CM | POA: Insufficient documentation

## 2018-06-13 DIAGNOSIS — C8590 Non-Hodgkin lymphoma, unspecified, unspecified site: Secondary | ICD-10-CM | POA: Diagnosis not present

## 2018-06-13 DIAGNOSIS — Z8572 Personal history of non-Hodgkin lymphomas: Secondary | ICD-10-CM | POA: Diagnosis not present

## 2018-06-13 LAB — CMP (CANCER CENTER ONLY)
ALT: 17 U/L (ref 0–44)
AST: 28 U/L (ref 15–41)
Albumin: 4 g/dL (ref 3.5–5.0)
Alkaline Phosphatase: 54 U/L (ref 38–126)
Anion gap: 10 (ref 5–15)
BUN: 16 mg/dL (ref 8–23)
CHLORIDE: 106 mmol/L (ref 98–111)
CO2: 25 mmol/L (ref 22–32)
CREATININE: 0.81 mg/dL (ref 0.44–1.00)
Calcium: 9.5 mg/dL (ref 8.9–10.3)
GFR, Est AFR Am: >60 mL/min (ref >60)
GFR, Estimated: >60 mL/min (ref >60)
Glucose, Bld: 89 mg/dL (ref 70–99)
Potassium: 4.2 mmol/L (ref 3.5–5.1)
SODIUM: 141 mmol/L (ref 135–145)
Total Bilirubin: 0.4 mg/dL (ref 0.3–1.2)
Total Protein: 7.1 g/dL (ref 6.5–8.1)

## 2018-06-13 LAB — CBC WITH DIFFERENTIAL (CANCER CENTER ONLY)
ABS IMMATURE GRANULOCYTES: 0.03 10*3/uL (ref 0.00–0.07)
BASOS PCT: 1 %
Basophils Absolute: 0.1 10*3/uL (ref 0.0–0.1)
Eosinophils Absolute: 0.1 10*3/uL (ref 0.0–0.5)
Eosinophils Relative: 1 %
HCT: 39.5 % (ref 36.0–46.0)
Hemoglobin: 13.1 g/dL (ref 12.0–15.0)
IMMATURE GRANULOCYTES: 0 %
LYMPHS PCT: 37 %
Lymphs Abs: 2.5 10*3/uL (ref 0.7–4.0)
MCH: 31 pg (ref 26.0–34.0)
MCHC: 33.2 g/dL (ref 30.0–36.0)
MCV: 93.4 fL (ref 80.0–100.0)
MONOS PCT: 7 %
Monocytes Absolute: 0.5 10*3/uL (ref 0.1–1.0)
NEUTROS ABS: 3.7 10*3/uL (ref 1.7–7.7)
Neutrophils Relative %: 54 %
PLATELETS: 223 10*3/uL (ref 150–400)
RBC: 4.23 MIL/uL (ref 3.87–5.11)
RDW: 13.5 % (ref 11.5–15.5)
WBC Count: 6.9 10*3/uL (ref 4.0–10.5)
nRBC: 0 % (ref 0.0–0.2)

## 2018-06-13 LAB — LACTATE DEHYDROGENASE: LDH: 277 U/L — ABNORMAL HIGH (ref 98–192)

## 2018-06-13 MED ORDER — SODIUM CHLORIDE (PF) 0.9 % IJ SOLN
INTRAMUSCULAR | Status: AC
  Filled 2018-06-13: qty 50

## 2018-06-13 MED ORDER — IOHEXOL 300 MG/ML  SOLN
100.0000 mL | Freq: Once | INTRAMUSCULAR | Status: AC | PRN
  Administered 2018-06-13: 100 mL via INTRAVENOUS

## 2018-06-14 ENCOUNTER — Ambulatory Visit (INDEPENDENT_AMBULATORY_CARE_PROVIDER_SITE_OTHER): Payer: Medicare Other

## 2018-06-14 ENCOUNTER — Telehealth: Payer: Self-pay

## 2018-06-14 DIAGNOSIS — I495 Sick sinus syndrome: Secondary | ICD-10-CM

## 2018-06-14 NOTE — Telephone Encounter (Signed)
Spoke with pt and reminded pt of remote transmission that is due today. Pt verbalized understanding.   

## 2018-06-15 ENCOUNTER — Inpatient Hospital Stay: Payer: Medicare Other | Admitting: Internal Medicine

## 2018-06-15 ENCOUNTER — Encounter: Payer: Self-pay | Admitting: Cardiology

## 2018-06-15 ENCOUNTER — Encounter: Payer: Self-pay | Admitting: Internal Medicine

## 2018-06-15 VITALS — BP 160/94 | HR 89 | Temp 97.9°F | Resp 18 | Ht 64.0 in | Wt 134.1 lb

## 2018-06-15 DIAGNOSIS — I10 Essential (primary) hypertension: Secondary | ICD-10-CM

## 2018-06-15 DIAGNOSIS — Z8572 Personal history of non-Hodgkin lymphomas: Secondary | ICD-10-CM

## 2018-06-15 DIAGNOSIS — Z79899 Other long term (current) drug therapy: Secondary | ICD-10-CM | POA: Diagnosis not present

## 2018-06-15 DIAGNOSIS — Z9221 Personal history of antineoplastic chemotherapy: Secondary | ICD-10-CM | POA: Diagnosis not present

## 2018-06-15 DIAGNOSIS — C8221 Follicular lymphoma grade III, unspecified, lymph nodes of head, face, and neck: Secondary | ICD-10-CM

## 2018-06-15 NOTE — Progress Notes (Signed)
[place] [phone]   [phone]  OFFICE PROGRESS NOTE  Erica Alar, NP [name] [id number]  DIAGNOSIS: Recurrent non-Hodgkin lymphoma, follicular center cell type with predominant follicular pattern favor high-grade (grade 3/3) diagnosed in November 2012.  PRIOR THERAPY: 1) Status post treatment with Rituxan weekly for 3 doses in addition to 3 tablets of Afinitor at [facility] in [name].  2) Status post surgical excision of the right neck mass under the care of Dr. Johney Maine on 10/28/2012.  3)  Maintenance Rituxan 375 mg/M2 every 2 months status post 12 cycles, last dose was given 10/01/2014.  CURRENT THERAPY: Observation.  INTERVAL HISTORY: [name] 83 y.o. female returns to the clinic today for annual follow-up visit.  The patient is feeling fine today with no concerning complaints.  She denied having any chest pain, shortness of breath, cough or hemoptysis.  She denied having any palpable lymphadenopathy.  She has no recent weight loss or night sweats.  She has no nausea, vomiting, diarrhea or constipation.  She had repeat CT scan of the chest, abdomen and pelvis performed recently and she is here for evaluation and discussion of her scan results.  MEDICAL HISTORY: Past Medical History:  Diagnosis Date  . Anemia    pt denies  . Arthritis   . Atrial fibrillation ([place])   . Cataract    bil  . Cystocele    history with rectocele, pt denies  . Dysrhythmia   . GERD (gastroesophageal reflux disease)   . H/O hiatal hernia   . Hemorrhoid   . History of pancreatitis    pt denies and denies as of today 08/09/2017  . Hypertension   . Hypothyroidism   . Non Hodgkin's lymphoma ([name])    dx 2012 and now in remission  . Osteoporosis   . Pacemaker 09/15/2011   Medtronic Revo  . Pericarditis   . Pleural effusion, bilateral    history of  . Pulmonary nodules    history of  . Scoliosis     . Varicose vein     ALLERGIES:  is allergic to amoxicillin; buprenorphine; codeine; demerol; morphine and related; sulfonamide derivatives; and vicodin [hydrocodone-acetaminophen].  MEDICATIONS:  Current Outpatient Medications  Medication Sig Dispense Refill  . acetaminophen (TYLENOL) 500 MG tablet Take 500 mg by mouth every 6 (six) hours as needed (for pain.).     [name] ALPRAZolam (XANAX) 0.5 MG tablet Take 1 tablet (0.5 mg total) by mouth 3 (three) times daily as needed for anxiety. 30 tablet 0  . Calcium Carbonate-Vitamin D (CALTRATE 600+D) 600-400 MG-UNIT per tablet Take 1 tablet by mouth 2 (two) times daily.    [name] denosumab (PROLIA) 60 MG/ML SOLN injection Inject 60 mg into the skin every 6 (six) months. Reported on 10/16/2015    . gabapentin (NEURONTIN) 300 MG capsule TAKE 1 CAPSULE(300 MG) BY MOUTH THREE TIMES DAILY AS NEEDED FOR NERVE PAIN 90 capsule 5  . HIZENTRA 4 GM/20ML SOLN Inject 4 g into the skin every Wednesday.     [name] ibuprofen (ADVIL,MOTRIN) 200 MG tablet Take 200-400 mg by mouth every 6 (six) hours as needed (for pain.).    [name] levothyroxine (SYNTHROID, LEVOTHROID) 100 MCG tablet TAKE 1 TABLET BY MOUTH ONCE DAILY IN THE MORNING 90 tablet 0  . lisinopril (PRINIVIL,ZESTRIL) 20 MG tablet Take 1 tablet (20 mg total) by mouth 2 (two) times daily. 60 tablet 2  . metoprolol  tartrate (LOPRESSOR) 25 MG tablet Take 0.5-1 tablets (12.5-25 mg total) by mouth 2 (two) times daily as needed (Palpitations). 60 tablet 11  . omeprazole (PRILOSEC) 40 MG capsule TAKE 1 CAPSULE BY MOUTH ONCE DAILY 30 capsule 5  . traMADol (ULTRAM) 50 MG tablet Take 1 tablet (50 mg total) by mouth every 6 (six) hours as needed (FOR PAIN.). 30 tablet 0   No current facility-administered medications for this visit.     SURGICAL HISTORY:  Past Surgical History:  Procedure Laterality Date  . BREAST SURGERY  07/30/01   biopsy x 3, bilateral  . CATARACT EXTRACTION Bilateral   . COLONOSCOPY    . MASS EXCISION Right  10/28/2012   Procedure: Removal of mass on neck       ;  Surgeon: Adin Hector, MD;  Location: [name];  Service: General;  Laterality: Right;  . PARTIAL HYSTERECTOMY  04/29/2004  . PERMANENT PACEMAKER INSERTION N/A 09/15/2011   Procedure: PERMANENT PACEMAKER INSERTION;  Surgeon: Sanda Klein, MD;  Location: [facility] CATH [facility];  Service: Cardiovascular;  Laterality: N/A;  . RADIOACTIVE SEED GUIDED EXCISIONAL BREAST BIOPSY Left 08/11/2017   Procedure: LEFT RADIOACTIVE SEED GUIDED EXCISIONAL BREAST BIOPSY ERAS PATHWAY;  Surgeon: Stark Klein, MD;  Location: [place] [facility];  Service: General;  Laterality: Left;  . TONSILLECTOMY      REVIEW OF SYSTEMS:  A comprehensive review of systems was negative.   PHYSICAL EXAMINATION: General appearance: alert, cooperative and no distress Head: Normocephalic, without obvious abnormality, atraumatic Neck: no adenopathy, no JVD, supple, symmetrical, trachea midline and thyroid not enlarged, symmetric, no tenderness/mass/nodules Lymph nodes: Cervical, supraclavicular, and axillary nodes normal. Resp: clear to auscultation bilaterally Back: symmetric, no curvature. ROM normal. No CVA tenderness. Cardio: regular rate and rhythm, S1, S2 normal, no murmur, click, rub or gallop GI: soft, non-tender; bowel sounds normal; no masses,  no organomegaly Extremities: extremities normal, atraumatic, no cyanosis or edema  ECOG PERFORMANCE STATUS: 1 - Symptomatic but completely ambulatory  Blood pressure (!) 160/94, pulse 89, temperature 97.9 F (36.6 C), temperature source Oral, resp. rate 18, height 5\' 4"  (1.626 m), weight 134 lb 1.6 oz (60.8 kg), SpO2 99 %.  LABORATORY DATA: Lab Results  Component Value Date   WBC 6.9 06/13/2018   HGB 13.1 06/13/2018   HCT 39.5 06/13/2018   MCV 93.4 06/13/2018   PLT 223 06/13/2018      Chemistry      Component Value Date/Time   NA 141 06/13/2018 0914   NA 143 11/26/2017 1159   NA 143 06/16/2017 0921   K 4.2 06/13/2018 0914   K 3.8  06/16/2017 0921   CL 106 06/13/2018 0914   CL 109 (H) 12/14/2012 1009   CO2 25 06/13/2018 0914   CO2 28 06/16/2017 0921   BUN 16 06/13/2018 0914   BUN 18 11/26/2017 1159   BUN 11.7 06/16/2017 0921   CREATININE 0.81 06/13/2018 0914   CREATININE 0.8 06/16/2017 0921      Component Value Date/Time   CALCIUM 9.5 06/13/2018 0914   CALCIUM 9.7 06/16/2017 0921   ALKPHOS 54 06/13/2018 0914   ALKPHOS 63 06/16/2017 0921   AST 28 06/13/2018 0914   AST 23 06/16/2017 0921   ALT 17 06/13/2018 0914   ALT 18 06/16/2017 0921   BILITOT 0.4 06/13/2018 0914   BILITOT 0.52 06/16/2017 0921       RADIOGRAPHIC STUDIES: Ct Chest W Contrast  Result Date: 06/13/2018 CLINICAL DATA:  Non-Hodgkin lymphoma. EXAM: CT CHEST, ABDOMEN, AND  PELVIS WITH CONTRAST TECHNIQUE: Multidetector CT imaging of the chest, abdomen and pelvis was performed following the standard protocol during bolus administration of intravenous contrast. CONTRAST:  162mL OMNIPAQUE IOHEXOL 300 MG/ML  SOLN COMPARISON:  06/16/2017 and 06/18/2016. FINDINGS: CT CHEST FINDINGS Cardiovascular: Atherosclerotic calcification of the aorta. Pulmonic trunk is borderline enlarged. Heart is enlarged. Small amount of pericardial fluid is unchanged and likely physiologic. Mediastinum/Nodes: Right thyroid is asymmetrically enlarged and contains subcentimeter low-attenuation lesions, nonspecific. No pathologically enlarged mediastinal, hilar or axillary lymph nodes. Esophagus is grossly unremarkable. Large hiatal hernia. Lungs/Pleura: Multiple upper and midlung zone predominant peripheral/subpleural nodules measure up to 7 mm in the right lower lobe, as on comparison exams. Chronic atelectasis/scarring in both lower lobes. No pleural fluid. Airway is unremarkable. Musculoskeletal: Degenerative changes and scoliosis in the thoracic spine. No worrisome lytic or sclerotic lesions. CT ABDOMEN PELVIS FINDINGS Hepatobiliary: Low-attenuation lesions in the liver measure up to  8 mm, as before. Liver and gallbladder are otherwise unremarkable. No biliary ductal dilatation. Pancreas: Negative. Spleen: Negative. Adrenals/Urinary Tract: Adrenal glands and right kidney are unremarkable. Subcentimeter low-attenuation lesions in the left kidney are too small to characterize. Ureters are decompressed. Bladder is grossly unremarkable. Stomach/Bowel: Large hiatal hernia. Stomach, small bowel, appendix and colon are otherwise unremarkable. Vascular/Lymphatic: Atherosclerotic calcification of the aorta without aneurysm. No pathologically enlarged lymph nodes. Reproductive: Hysterectomy.  No adnexal mass. Other: No free fluid.  Mesenteries and peritoneum are unremarkable. Musculoskeletal: Degenerative changes and scoliosis in the spine. No worrisome lytic or sclerotic lesions. IMPRESSION: 1. No evidence of recurrent lymphoma. 2. Continued stability of multiple peripheral/subpleural pulmonary nodules, indicative of benign lesions. 3. Aortic atherosclerosis (ICD10-170.0). 4. Pulmonic trunk is borderline enlarged, suggesting pulmonary arterial hypertension. 5. Large hiatal hernia. Electronically Signed   By: Lorin Picket M.D.   On: 06/13/2018 14:58   Ct Abdomen Pelvis W Contrast  Result Date: 06/13/2018 CLINICAL DATA:  Non-Hodgkin lymphoma. EXAM: CT CHEST, ABDOMEN, AND PELVIS WITH CONTRAST TECHNIQUE: Multidetector CT imaging of the chest, abdomen and pelvis was performed following the standard protocol during bolus administration of intravenous contrast. CONTRAST:  18mL OMNIPAQUE IOHEXOL 300 MG/ML  SOLN COMPARISON:  06/16/2017 and 06/18/2016. FINDINGS: CT CHEST FINDINGS Cardiovascular: Atherosclerotic calcification of the aorta. Pulmonic trunk is borderline enlarged. Heart is enlarged. Small amount of pericardial fluid is unchanged and likely physiologic. Mediastinum/Nodes: Right thyroid is asymmetrically enlarged and contains subcentimeter low-attenuation lesions, nonspecific. No pathologically  enlarged mediastinal, hilar or axillary lymph nodes. Esophagus is grossly unremarkable. Large hiatal hernia. Lungs/Pleura: Multiple upper and midlung zone predominant peripheral/subpleural nodules measure up to 7 mm in the right lower lobe, as on comparison exams. Chronic atelectasis/scarring in both lower lobes. No pleural fluid. Airway is unremarkable. Musculoskeletal: Degenerative changes and scoliosis in the thoracic spine. No worrisome lytic or sclerotic lesions. CT ABDOMEN PELVIS FINDINGS Hepatobiliary: Low-attenuation lesions in the liver measure up to 8 mm, as before. Liver and gallbladder are otherwise unremarkable. No biliary ductal dilatation. Pancreas: Negative. Spleen: Negative. Adrenals/Urinary Tract: Adrenal glands and right kidney are unremarkable. Subcentimeter low-attenuation lesions in the left kidney are too small to characterize. Ureters are decompressed. Bladder is grossly unremarkable. Stomach/Bowel: Large hiatal hernia. Stomach, small bowel, appendix and colon are otherwise unremarkable. Vascular/Lymphatic: Atherosclerotic calcification of the aorta without aneurysm. No pathologically enlarged lymph nodes. Reproductive: Hysterectomy.  No adnexal mass. Other: No free fluid.  Mesenteries and peritoneum are unremarkable. Musculoskeletal: Degenerative changes and scoliosis in the spine. No worrisome lytic or sclerotic lesions. IMPRESSION: 1. No evidence of recurrent  lymphoma. 2. Continued stability of multiple peripheral/subpleural pulmonary nodules, indicative of benign lesions. 3. Aortic atherosclerosis (ICD10-170.0). 4. Pulmonic trunk is borderline enlarged, suggesting pulmonary arterial hypertension. 5. Large hiatal hernia. Electronically Signed   By: Lorin Picket M.D.   On: 06/13/2018 14:58    ASSESSMENT AND PLAN:  This is a very pleasant 83 years old white female with recurrent non-Hodgkin lymphoma, follicular center cell type status post treatment with Rituxan as well as Afinitor at  [facility] and also completed a course of maintenance Rituxan for 12 cycles last dose was given 10/01/2014. She is currently on observation and she is feeling fine. She had repeat CT scan of the chest, abdomen pelvis performed recently.  I personally and independently reviewed the scans and discussed the results with the patient today. Her scan showed no concerning findings for disease recurrence or progression. For hypertension I recommended for the patient to take her blood pressure medication as prescribed and to consult with her primary care physician for adjustment of her medication if needed. I will see her back for follow-up visit in 1 year for evaluation with repeat lab work as well as CT scan of the chest, abdomen and pelvis for restaging of her disease. The patient was advised to call immediately if she has any concerning symptoms in the interval. The patient voices understanding of current disease status and treatment options and is in agreement with the current care plan. All questions were answered. The patient knows to call the clinic with any problems, questions or concerns. We can certainly see the patient much sooner if necessary.   Disclaimer: This note was dictated with voice recognition software. Similar sounding words can inadvertently be transcribed and may not be corrected upon review.

## 2018-06-15 NOTE — Progress Notes (Signed)
Remote pacemaker transmission.   

## 2018-06-17 ENCOUNTER — Telehealth: Payer: Self-pay | Admitting: Internal Medicine

## 2018-06-17 NOTE — Telephone Encounter (Signed)
Scheduled appt per 12/18 los - reminder letter sent in the mail with appt date and time

## 2018-06-17 NOTE — Telephone Encounter (Signed)
Duplicate

## 2018-06-20 ENCOUNTER — Encounter: Payer: Self-pay | Admitting: Physician Assistant

## 2018-06-20 ENCOUNTER — Ambulatory Visit: Payer: Medicare Other | Admitting: Physician Assistant

## 2018-06-20 VITALS — BP 134/80 | HR 79 | Ht 65.0 in | Wt 133.8 lb

## 2018-06-20 DIAGNOSIS — C8590 Non-Hodgkin lymphoma, unspecified, unspecified site: Secondary | ICD-10-CM

## 2018-06-20 DIAGNOSIS — C859A Non-Hodgkin lymphoma, unspecified, in remission: Secondary | ICD-10-CM

## 2018-06-20 DIAGNOSIS — Z950 Presence of cardiac pacemaker: Secondary | ICD-10-CM

## 2018-06-20 DIAGNOSIS — E785 Hyperlipidemia, unspecified: Secondary | ICD-10-CM

## 2018-06-20 DIAGNOSIS — E039 Hypothyroidism, unspecified: Secondary | ICD-10-CM

## 2018-06-20 DIAGNOSIS — R0989 Other specified symptoms and signs involving the circulatory and respiratory systems: Secondary | ICD-10-CM

## 2018-06-20 DIAGNOSIS — I480 Paroxysmal atrial fibrillation: Secondary | ICD-10-CM

## 2018-06-20 MED ORDER — METOPROLOL TARTRATE 25 MG PO TABS: 25.0000 mg | ORAL_TABLET | Freq: Every day | ORAL | 1 refills | Status: DC

## 2018-06-20 MED ORDER — ATORVASTATIN CALCIUM 40 MG PO TABS: 40.0000 mg | ORAL_TABLET | Freq: Every day | ORAL | 3 refills | Status: DC

## 2018-06-20 NOTE — Patient Instructions (Signed)
Medication Instructions:  Continue taking Metoprolol 25 daily, you can take an extra 1/2 tablet if top number on blood pressure is above 160.  Start Lipitor 40 mg daily. If you need a refill on your cardiac medications before your next appointment, please call your pharmacy.   Lab work: Fasting Lipid and Liver test in 6-8 weeks.  If you have labs (blood work) drawn today and your tests are completely normal, you will receive your results only by: [name] MyChart Message (if you have MyChart) OR . A paper copy in the mail If you have any lab test that is abnormal or we need to change your treatment, we will call you to review the results.  Testing/Procedures: Your physician has requested that you have a renal artery duplex. During this test, an ultrasound is used to evaluate blood flow to the kidneys. Allow one hour for this exam. Do not eat after midnight the day before and avoid carbonated beverages. Take your medications as you usually do.  Follow-Up: At [facility], you and your health needs are our priority.  As part of our continuing mission to provide you with exceptional heart care, we have created designated Provider Care Teams.  These Care Teams include your primary Cardiologist (physician) and Advanced Practice Providers (APPs -  Physician Assistants and Nurse Practitioners) who all work together to provide you with the care you need, when you need it. . You will need a follow up appointment in 4 months.  Please call our office 1 month in advance to schedule this appointment.  You may see Sanda Klein, MD ONLY

## 2018-06-20 NOTE — Progress Notes (Signed)
Cardiology Office Note    Date:  06/22/2018   ID:  [name], DOB [birth year], MRN [id number]  PCP:  [name], NP  Cardiologist:  Dr. Sallyanne Kuster  Chief Complaint  Patient presents with  . Follow-up    seen for Dr. Sallyanne Kuster.     History of Present Illness:  [name] is a 82 y.o. female with PMH of sinus node dysfunction s/p Medtronic dual-chamber pacemaker in 2013, non-Hodgkin's lymphoma in remission, pericarditis, atrial fibrillation, history of hiatal hernia, hypertension, and hypothyroidism.  She had a history of postprocedural acute pericarditis and had atrial fibrillation related to acute pericarditis.  This has not recurred years.  In 2018, she had syncopal event shortly after arriving in [place] after a long airplane trip, echocardiogram at the time showed a normal LV systolic function, also evidence of diastolic dysfunction and increased filling pressure.  Her last office visit with Dr. Sallyanne Kuster was on 01/13/2018, at which time she was doing well.  Her device is programmed AAIR.  She was previously treated for leg edema with Lasix, she had a very good response.  She has no longer taking the Lasix at this time.  Recent CT of abdomen pelvis obtained on 06/13/2018 showed no recurrent lymphoma, continued stability of multiple peripheral and subpleural pulmonary nodule, large hiatal hernia.  Patient presents today for cardiology office visit.  She will still occasionally have palpitation, her main issue is more of the labile blood pressure.  Her blood pressure ranges from 100-190.  Most of the time, her systolic blood pressure seems to be around 1 30-1 50 millimeters mercury.  She is on 20 mg twice daily of lisinopril and to take 25 mg twice daily of metoprolol tartrate.  I am not confident how much better we can control her blood pressure even switching her to metoprolol succinate as she takes metoprolol and lisinopril on a quite a scheduled basis.  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  I recommend she take additional metoprolol 12.5 mg tablet on as-needed basis for systolic blood pressure greater than 160.  I will obtain a renal artery ultrasound to rule out renal artery stenosis as a cause for her symptoms.  Otherwise her most recent cholesterol panel obtained in April 2019 showed severely uncontrolled total cholesterol, LDL and triglyceride.  I recommend for her to initiate a 40 mg daily of Lipitor.  She will need to return in 6 to 8 weeks for fasting lipid panel and LFT.  Past Medical History:  Diagnosis Date  . Anemia    pt denies  . Arthritis   . Atrial fibrillation ([place])   . Cataract    bil  . Cystocele    history with rectocele, pt denies  . Dysrhythmia   . GERD (gastroesophageal reflux disease)   . H/O hiatal hernia   . Hemorrhoid   . History of pancreatitis    pt denies and denies as of today 08/09/2017  . Hypertension   . Hypothyroidism   . Non Hodgkin's lymphoma ([place])    dx 2012 and now in remission  . Osteoporosis   . Pacemaker 09/15/2011   Medtronic Revo  . Pericarditis   . Pleural effusion, bilateral    history of  . Pulmonary nodules    history of  . Scoliosis   . Varicose vein     Past Surgical History:  Procedure Laterality Date  . BREAST SURGERY  07/30/01  biopsy x 3, bilateral  . CATARACT EXTRACTION Bilateral   . COLONOSCOPY    . MASS EXCISION Right 10/28/2012   Procedure: Removal of mass on neck       ;  Surgeon: Adin Hector, MD;  Location: [facility];  Service: General;  Laterality: Right;  . PARTIAL HYSTERECTOMY  04/29/2004  . PERMANENT PACEMAKER INSERTION N/A 09/15/2011   Procedure: PERMANENT PACEMAKER INSERTION;  Surgeon: Sanda Klein, MD;  Location: [facility]ATH [facility];  Service: Cardiovascular;  Laterality: N/A;  . RADIOACTIVE SEED GUIDED EXCISIONAL BREAST BIOPSY Left 08/11/2017   Procedure: LEFT RADIOACTIVE SEED GUIDED EXCISIONAL BREAST BIOPSY ERAS  PATHWAY;  Surgeon: Stark Klein, MD;  Location: [facility];  Service: General;  Laterality: Left;  . TONSILLECTOMY      Current Medications: Outpatient Medications Prior to Visit  Medication Sig Dispense Refill  . acetaminophen (TYLENOL) 500 MG tablet Take 500 mg by mouth every 6 (six) hours as needed (for pain.).     [name] Calcium Carbonate-Vitamin D (CALTRATE 600+D) 600-400 MG-UNIT per tablet Take 1 tablet by mouth 2 (two) times daily.    [name] denosumab (PROLIA) 60 MG/ML SOLN injection Inject 60 mg into the skin every 6 (six) months. Reported on 10/16/2015    . gabapentin (NEURONTIN) 300 MG capsule TAKE 1 CAPSULE(300 MG) BY MOUTH THREE TIMES DAILY AS NEEDED FOR NERVE PAIN (Patient taking differently: Take 300 mg by mouth daily as needed. ) 90 capsule 5  . HIZENTRA 4 GM/20ML SOLN Inject 4 g into the skin every Wednesday.     [name] ibuprofen (ADVIL,MOTRIN) 200 MG tablet Take 200-400 mg by mouth every 6 (six) hours as needed (for pain.).    [name] levothyroxine (SYNTHROID, LEVOTHROID) 100 MCG tablet TAKE 1 TABLET BY MOUTH ONCE DAILY IN THE MORNING 90 tablet 0  . lisinopril (PRINIVIL,ZESTRIL) 20 MG tablet Take 1 tablet (20 mg total) by mouth 2 (two) times daily. 60 tablet 2  . omeprazole (PRILOSEC) 40 MG capsule TAKE 1 CAPSULE BY MOUTH ONCE DAILY 30 capsule 5  . metoprolol tartrate (LOPRESSOR) 25 MG tablet Take 0.5-1 tablets (12.5-25 mg total) by mouth 2 (two) times daily as needed (Palpitations). 60 tablet 11   No facility-administered medications prior to visit.      Allergies:   Amoxicillin; Buprenorphine; Codeine; Demerol; Morphine and related; Sulfonamide derivatives; and Vicodin [hydrocodone-acetaminophen]   Social History   Socioeconomic History  . Marital status: Widowed    Spouse name: Not on file  . Number of children: 3  . Years of education: Not on file  . Highest education level: Not on file  Occupational History  . Occupation: [employer]  Social Needs  . Financial resource strain:  Not on file  . Food insecurity:    Worry: Not on file    Inability: Not on file  . Transportation needs:    Medical: Not on file    Non-medical: Not on file  Tobacco Use  . Smoking status: Never Smoker  . Smokeless tobacco: Never Used  Substance and Sexual Activity  . Alcohol use: Yes    Comment: 1 glass of wine occasionally  . Drug use: No  . Sexual activity: Not on file  Lifestyle  . Physical activity:    Days per week: Not on file    Minutes per session: Not on file  . Stress: Not on file  Relationships  . Social connections:    Talks on phone: Not on file    Gets together: Not on file  Attends religious service: Not on file    Active member of club or organization: Not on file    Attends meetings of clubs or organizations: Not on file    Relationship status: Not on file  Other Topics Concern  . 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  She lives alone in a 3-story home.  There is a lift chair which she does not use (placed for her husband).     Family History:  The patient's family history includes Breast cancer in her sister; Heart disease in her brother and daughter; Lymphoma in her daughter; Stroke in her father and mother.   ROS:   Please see the history of present illness.    ROS All other systems reviewed and are negative.   PHYSICAL EXAM:   VS:  BP 134/80   Pulse 79   Ht 5\' 5"  (1.651 m)   Wt 133 lb 12.8 oz (60.7 kg)   SpO2 97%   BMI 22.27 kg/m    GEN: Well nourished, well developed, in no acute distress  HEENT: normal  Neck: no JVD, carotid bruits, or masses Cardiac: RRR; no murmurs, rubs, or gallops,no edema  Respiratory:  clear to auscultation bilaterally, normal work of breathing GI: soft, nontender, nondistended, + BS MS: no deformity or atrophy  Skin: warm and dry, no rash Neuro:  Alert and Oriented x 3, Strength and sensation are intact Psych:  euthymic mood, full affect  Wt Readings from Last 3 Encounters:  06/20/18 133 lb 12.8 oz (60.7 kg)  06/15/18 134 lb 1.6 oz (60.8 kg)  06/02/18 132 lb (59.9 kg)      Studies/Labs Reviewed:   EKG:  EKG is not ordered today.    Recent Labs: 10/26/2017: Magnesium 2.1; TSH 0.69 06/13/2018: ALT 17; BUN 16; Creatinine 0.81; Hemoglobin 13.1; Platelet Count 223; Potassium 4.2; Sodium 141   Lipid Panel    Component Value Date/Time   CHOL 232 (H) 10/26/2017 1035   TRIG 196.0 (H) 10/26/2017 1035   HDL 46.60 10/26/2017 1035   CHOLHDL 5 10/26/2017 1035   VLDL 39.2 10/26/2017 1035   LDLCALC 147 (H) 10/26/2017 1035    Additional studies/ records that were reviewed today include:   Echo 04/28/2016 LV EF: 60% -   65% Study Conclusions  - Left ventricle: The cavity size was normal. Systolic function was   normal. The estimated ejection fraction was in the range of 60%   to 65%. Wall motion was normal; there were no regional wall   motion abnormalities. Features are consistent with a pseudonormal   left ventricular filling pattern, with concomitant abnormal   relaxation and increased filling pressure (grade 2 diastolic   dysfunction). Doppler parameters are consistent with high   ventricular filling pressure. - Aortic valve: Trileaflet; normal thickness, mildly calcified   leaflets. - Pulmonary arteries: PA peak pressure: 45 mm Hg (S).  Impressions:  - The right ventricular systolic pressure was increased consistent   with moderate pulmonary hypertension.     CT abdomen 06/13/2018 IMPRESSION: 1. No evidence of recurrent lymphoma. 2. Continued stability of multiple peripheral/subpleural pulmonary nodules, indicative of benign lesions. 3. Aortic atherosclerosis (ICD10-170.0). 4. Pulmonic trunk is borderline enlarged, suggesting pulmonary arterial hypertension. 5. Large hiatal hernia.    ASSESSMENT:    1. Labile blood pressure   2. Hyperlipidemia, unspecified  hyperlipidemia type   3. Pacemaker   4. Non-Hodgkin lymphoma in remission (  [place])   5. Hypothyroidism, unspecified type   6. PAF (paroxysmal atrial fibrillation) (HCC)      PLAN:  In order of problems listed above:  1. Labile hypertension: She has been taking lisinopril 20 mg twice daily along with metoprolol as a scheduled medication.  I recommended a renal artery Doppler to follow-up on the cause of labile hypertension.  I also recommended for her to take extra half a tablet of metoprolol as needed for systolic blood pressure above 160  2. Hyperlipidemia: Start on Lipitor 40 mg daily.  Will need 6 to 8 weeks fasting lipid panel and LFT  3. Sinus node dysfunction status post Medtronic dual-chamber pacemaker: No recent issue  4. Non-Hodgkin's lymphoma: Currently in remission  5. PAF: Occurred in the setting of postprocedural pericarditis.  No recurrence  6. Hypothyroidism: Managed by primary care provider.    Medication Adjustments/Labs and Tests Ordered: Current medicines are reviewed at length with the patient today.  Concerns regarding medicines are outlined above.  Medication changes, Labs and Tests ordered today are listed in the Patient Instructions below. Patient Instructions  Medication Instructions:  Continue taking Metoprolol 25 daily, you can take an extra 1/2 tablet if top number on blood pressure is above 160.  Start Lipitor 40 mg daily. If you need a refill on your cardiac medications before your next appointment, please call your pharmacy.   Lab work: Fasting Lipid and Liver test in 6-8 weeks.  If you have labs (blood work) drawn today and your tests are completely normal, you will receive your results only by: [name] MyChart Message (if you have MyChart) OR . A paper copy in the mail If you have any lab test that is abnormal or we need to change your treatment, we will call you to review the results.  Testing/Procedures: Your physician has requested that you have a  renal artery duplex. During this test, an ultrasound is used to evaluate blood flow to the kidneys. Allow one hour for this exam. Do not eat after midnight the day before and avoid carbonated beverages. Take your medications as you usually do.  Follow-Up: At [facility], you and your health needs are our priority.  As part of our continuing mission to provide you with exceptional heart care, we have created designated Provider Care Teams.  These Care Teams include your primary Cardiologist (physician) and Advanced Practice Providers (APPs -  Physician Assistants and Nurse Practitioners) who all work together to provide you with the care you need, when you need it. . You will need a follow up appointment in 4 months.  Please call our office 1 month in advance to schedule this appointment.  You may see Sanda Klein, MD ONLY        Hilbert Corrigan, [place]  06/22/2018 11:49 PM    [address], [place], [place]  [address] Phone: [phone]; Fax: [phone]

## 2018-06-22 ENCOUNTER — Encounter: Payer: Self-pay | Admitting: Physician Assistant

## 2018-06-30 ENCOUNTER — Other Ambulatory Visit: Payer: Self-pay | Admitting: Family

## 2018-06-30 NOTE — Telephone Encounter (Signed)
[name] -- please call pt to schedule OV with Melissa soon. We sent a 30 day supply of levothyroxine to her pharmacy. She was due for a 6 month follow up in October and is past due. Thank You!

## 2018-06-30 NOTE — Telephone Encounter (Signed)
Scheduled appt for 07/12/18.

## 2018-07-06 ENCOUNTER — Ambulatory Visit (HOSPITAL_COMMUNITY)
Admission: RE | Admit: 2018-07-06 | Discharge: 2018-07-06 | Disposition: A | Discharge status: Home / Self Care | Payer: Medicare Other | Source: Ambulatory Visit | Attending: Cardiology | Admitting: Cardiology

## 2018-07-06 DIAGNOSIS — R0989 Other specified symptoms and signs involving the circulatory and respiratory systems: Secondary | ICD-10-CM | POA: Insufficient documentation

## 2018-07-12 ENCOUNTER — Ambulatory Visit: Payer: Medicare Other | Admitting: Family

## 2018-07-12 ENCOUNTER — Encounter: Payer: Self-pay | Admitting: Family

## 2018-07-12 ENCOUNTER — Telehealth: Payer: Self-pay

## 2018-07-12 VITALS — BP 98/73 | HR 76 | Temp 98.5°F | Resp 16 | Ht 65.0 in | Wt 132.0 lb

## 2018-07-12 DIAGNOSIS — I10 Essential (primary) hypertension: Secondary | ICD-10-CM | POA: Diagnosis not present

## 2018-07-12 DIAGNOSIS — E039 Hypothyroidism, unspecified: Secondary | ICD-10-CM | POA: Diagnosis not present

## 2018-07-12 DIAGNOSIS — K219 Gastro-esophageal reflux disease without esophagitis: Secondary | ICD-10-CM

## 2018-07-12 LAB — BASIC METABOLIC PANEL
BUN: 12 mg/dL (ref 6–23)
CHLORIDE: 105 meq/L (ref 96–112)
CO2: 30 mEq/L (ref 19–32)
Calcium: 10 mg/dL (ref 8.4–10.5)
Creatinine, Ser: 0.72 mg/dL (ref 0.40–1.20)
GFR: 82.42 mL/min (ref >60.00)
Glucose, Bld: 78 mg/dL (ref 70–99)
Potassium: 4.4 mEq/L (ref 3.5–5.1)
Sodium: 142 mEq/L (ref 135–145)

## 2018-07-12 LAB — TSH: TSH: 5.17 u[IU]/mL — ABNORMAL HIGH (ref 0.35–4.50)

## 2018-07-12 NOTE — Progress Notes (Signed)
Subjective:    Patient ID: [name], female    DOB: Nov 22, 1934, 84 y.o.   MRN: [id number]  HPI  HTN- reports that her home reading this AM was 97/64. She did not take her medication this AM as a result. Reports that last night it was 162/112.  She takes lisinopril and metoprolol.  She is taking metoprolol 25mg  1/2 tab bid.   BP Readings from Last 3 Encounters:  07/12/18 98/73  06/20/18 134/80  06/15/18 (!) 160/94   GERD- using prilosec prn.    Hypothyroid- continues synthroid 112 mcg.  Lab Results  Component Value Date   TSH 0.69 10/26/2017      Review of Systems    see HPI  Past Medical History:  Diagnosis Date  . Anemia    pt denies  . Arthritis   . Atrial fibrillation ([place])   . Cataract    bil  . Cystocele    history with rectocele, pt denies  . Dysrhythmia   . GERD (gastroesophageal reflux disease)   . H/O hiatal hernia   . Hemorrhoid   . History of pancreatitis    pt denies and denies as of today 08/09/2017  . Hypertension   . Hypothyroidism   . Non Hodgkin's lymphoma ([place])    dx 2012 and now in remission  . Osteoporosis   . Pacemaker 09/15/2011   Medtronic Revo  . Pericarditis   . Pleural effusion, bilateral    history of  . Pulmonary nodules    history of  . Scoliosis   . Varicose vein      Social History   Socioeconomic History  . Marital status: Widowed    Spouse name: Not on file  . Number of children: 3  . Years of education: Not on file  . Highest education level: Not on file  Occupational History  . Occupation: [employer]  Social Needs  . Financial resource strain: Not on file  . Food insecurity:    Worry: Not on file    Inability: Not on file  . Transportation needs:    Medical: Not on file    Non-medical: Not on file  Tobacco Use  . Smoking status: Never Smoker  . Smokeless tobacco: Never Used  Substance and Sexual Activity  . Alcohol use: Yes    Comment: 1 glass of wine occasionally  . Drug use: No  .  Sexual activity: Not on file  Lifestyle  . Physical activity:    Days per week: Not on file    Minutes per session: Not on file  . Stress: Not on file  Relationships  . Social connections:    Talks on phone: Not on file    Gets together: Not on file    Attends religious service: Not on file    Active member of club or organization: Not on file    Attends meetings of clubs or organizations: Not on file    Relationship status: Not on file  . Intimate partner violence:    Fear of current or ex partner: Not on file    Emotionally abused: Not on file    Physically abused: Not on file    Forced sexual activity: Not on file  Other Topics Concern  . 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  She lives alone in a 3-story home.  There is a lift chair which she does not  use (placed for her husband).    Past Surgical History:  Procedure Laterality Date  . BREAST SURGERY  07/30/01   biopsy x 3, bilateral  . CATARACT EXTRACTION Bilateral   . COLONOSCOPY    . MASS EXCISION Right 10/28/2012   Procedure: Removal of mass on neck       ;  Surgeon: Adin Hector, MD;  Location: [facility];  Service: General;  Laterality: Right;  . PARTIAL HYSTERECTOMY  04/29/2004  . PERMANENT PACEMAKER INSERTION N/A 09/15/2011   Procedure: PERMANENT PACEMAKER INSERTION;  Surgeon: Sanda Klein, MD;  Location: [facility] CATH [facility];  Service: Cardiovascular;  Laterality: N/A;  . RADIOACTIVE SEED GUIDED EXCISIONAL BREAST BIOPSY Left 08/11/2017   Procedure: LEFT RADIOACTIVE SEED GUIDED EXCISIONAL BREAST BIOPSY ERAS PATHWAY;  Surgeon: Stark Klein, MD;  Location: [facility];  Service: General;  Laterality: Left;  . TONSILLECTOMY      Family History  Problem Relation Age of Onset  . Stroke Mother   . Stroke Father   . Heart disease Daughter        congenital "whole in her heart"  . Lymphoma Daughter        nonhodgkins  . Breast cancer  Sister   . Heart disease Brother   . Colon cancer Neg Hx   . Esophageal cancer Neg Hx   . Pancreatic cancer Neg Hx   . Stomach cancer Neg Hx   . Liver disease Neg Hx     Allergies  Allergen Reactions  . Amoxicillin Hives    Has patient had a PCN reaction causing immediate rash, facial/tongue/throat swelling, SOB or lightheadedness with hypotension: No Has patient had a PCN reaction causing severe rash involving mucus membranes or skin necrosis: No Has patient had a PCN reaction that required hospitalization: No Has patient had a PCN reaction occurring within the last 10 years: No If all of the above answers are "NO", then may proceed with Cephalosporin use.   . Buprenorphine Hives  . Codeine Hives    REACTION: "makes her crazy" Hives  . Demerol Hives  . Morphine And Related Hives  . Sulfonamide Derivatives Rash    REACTION: Rash  . Vicodin [Hydrocodone-Acetaminophen] Hives    Current Outpatient Medications on File Prior to Visit  Medication Sig Dispense Refill  . acetaminophen (TYLENOL) 500 MG tablet Take 500 mg by mouth every 6 (six) hours as needed (for pain.).     [name] atorvastatin (LIPITOR) 40 MG tablet Take 1 tablet (40 mg total) by mouth daily. 90 tablet 3  . Calcium Carbonate-Vitamin D (CALTRATE 600+D) 600-400 MG-UNIT per tablet Take 1 tablet by mouth 2 (two) times daily.    [name] denosumab (PROLIA) 60 MG/ML SOLN injection Inject 60 mg into the skin every 6 (six) months. Reported on 10/16/2015    . gabapentin (NEURONTIN) 300 MG capsule TAKE 1 CAPSULE(300 MG) BY MOUTH THREE TIMES DAILY AS NEEDED FOR NERVE PAIN (Patient taking differently: Take 300 mg by mouth daily as needed. ) 90 capsule 5  . HIZENTRA 4 GM/20ML SOLN Inject 4 g into the skin every Wednesday.     [name] ibuprofen (ADVIL,MOTRIN) 200 MG tablet Take 200-400 mg by mouth every 6 (six) hours as needed (for pain.).    [name] levothyroxine (SYNTHROID, LEVOTHROID) 100 MCG tablet TAKE 1 TABLET BY MOUTH ONCE DAILY IN THE MORNING 30  tablet 0  . lisinopril (PRINIVIL,ZESTRIL) 20 MG tablet Take 1 tablet (20 mg total) by mouth 2 (two) times daily. 60 tablet 2  .  metoprolol tartrate (LOPRESSOR) 25 MG tablet Take 1 tablet (25 mg total) by mouth daily. May take 1/2 tablet extra if systolic BP is above 762 90 tablet 1  . omeprazole (PRILOSEC) 40 MG capsule TAKE 1 CAPSULE BY MOUTH ONCE DAILY 30 capsule 5   No current facility-administered medications on file prior to visit.     BP 98/73 (BP Location: Left Arm, Patient Position: Sitting, Cuff Size: Small)   Pulse 76   Temp 98.5 F (36.9 C) (Oral)   Resp 16   Ht 5\' 5"  (1.651 m)   Wt 132 lb (59.9 kg)   SpO2 100%   BMI 21.97 kg/m    Objective:   Physical Exam Constitutional:      Appearance: She is well-developed.  Neck:     Musculoskeletal: Neck supple.     Thyroid: No thyromegaly.  Cardiovascular:     Rate and Rhythm: Normal rate and regular rhythm.     Heart sounds: Normal heart sounds. No murmur.  Pulmonary:     Effort: Pulmonary effort is normal. No respiratory distress.     Breath sounds: Normal breath sounds. No wheezing.  Skin:    General: Skin is warm and dry.  Neurological:     Mental Status: She is alert and oriented to person, place, and time.  Psychiatric:        Behavior: Behavior normal.        Thought Content: Thought content normal.        Judgment: Judgment normal.           Assessment & Plan:  HTN- bp is highly variable per report. Advised pt as follows:  Please check blood prior to medication dose.  Hold metoprolol and lisinopril if BP<110/50.  Complete lab work prior to leaving.  Continue to monitor your blood pressure. Call if BP <90/50 or >160/100.  GERD- stable on PPI.  Hypothyroid- clinically stable on synthroid. Obtain follow up TSH.

## 2018-07-12 NOTE — Telephone Encounter (Signed)
Prolia--benefits requested.

## 2018-07-12 NOTE — Patient Instructions (Addendum)
Please check blood prior to medication dose.  Hold metoprolol and lisinopril if BP<110/50.  Complete lab work prior to leaving.  Continue to monitor your blood pressure. Call if BP <90/50 or >160/100.

## 2018-07-13 ENCOUNTER — Telehealth: Payer: Self-pay | Admitting: Physician Assistant

## 2018-07-13 NOTE — Telephone Encounter (Signed)
New Message   Pt is wondering if she needs to schedule a follow up appt after her test she had on 01/08.  Please call

## 2018-07-13 NOTE — Telephone Encounter (Signed)
Spoke with pt who was inquiring as to when she is to follow up after test results. Pt updated with the results along with Almyra Deforest, PA recommendations. Pt also advised that per last OV, pt is to f/u in 4 months. Pt verbalized understanding.

## 2018-07-14 ENCOUNTER — Telehealth: Payer: Self-pay | Admitting: Family

## 2018-07-14 DIAGNOSIS — E039 Hypothyroidism, unspecified: Secondary | ICD-10-CM

## 2018-07-14 MED ORDER — LEVOTHYROXINE SODIUM 112 MCG PO TABS: 112.0000 ug | ORAL_TABLET | Freq: Every day | ORAL | 3 refills | Status: DC

## 2018-07-14 NOTE — Telephone Encounter (Signed)
Lab work shows thyroid medication needs increase. Rx sent for 112 mcg, repeat tsh in 4-6 weeks.

## 2018-07-14 NOTE — Telephone Encounter (Signed)
Benefits received- Summary of benefits forwarded to [facility] and [place].

## 2018-07-15 ENCOUNTER — Other Ambulatory Visit: Payer: Self-pay

## 2018-07-15 DIAGNOSIS — E039 Hypothyroidism, unspecified: Secondary | ICD-10-CM

## 2018-07-15 NOTE — Telephone Encounter (Signed)
Patient advised of results and medication increase. She reports her BP reading at home have been up and down. Per Lenna Sciara she was schedule to come in next week Thursday the 23rd for bp reading ant to bring her bp machine from home to compare. She will need to schedule tsh at that time.

## 2018-07-17 LAB — CUP PACEART REMOTE DEVICE CHECK
Battery Voltage: 2.95 V
Brady Statistic RA Percent Paced: 88.79 %
Date Time Interrogation Session: 20191217194849
Implantable Lead Implant Date: 20130319
Implantable Lead Implant Date: 20130319
Implantable Lead Location: 753860
Implantable Pulse Generator Implant Date: 20130319
Lead Channel Impedance Value: 376 Ohm
Lead Channel Impedance Value: 488 Ohm
Lead Channel Sensing Intrinsic Amplitude: 1.818 mV
Lead Channel Sensing Intrinsic Amplitude: 13.986 mV
Lead Channel Setting Sensing Sensitivity: 0.9 mV
MDC IDC LEAD LOCATION: 753859
MDC IDC SET LEADCHNL RA PACING AMPLITUDE: 2 V

## 2018-07-21 ENCOUNTER — Ambulatory Visit: Payer: Medicare Other

## 2018-07-21 DIAGNOSIS — D839 Common variable immunodeficiency, unspecified: Secondary | ICD-10-CM | POA: Diagnosis not present

## 2018-07-22 NOTE — Telephone Encounter (Signed)
Prolia scheduled 07/28/2018.

## 2018-07-28 ENCOUNTER — Ambulatory Visit: Payer: Medicare Other

## 2018-07-28 ENCOUNTER — Ambulatory Visit (INDEPENDENT_AMBULATORY_CARE_PROVIDER_SITE_OTHER): Payer: Medicare Other | Admitting: *Deleted

## 2018-07-28 ENCOUNTER — Telehealth: Payer: Self-pay

## 2018-07-28 DIAGNOSIS — M818 Other osteoporosis without current pathological fracture: Secondary | ICD-10-CM | POA: Diagnosis not present

## 2018-07-28 MED ORDER — DENOSUMAB 60 MG/ML ~~LOC~~ SOSY
60.0000 mg | PREFILLED_SYRINGE | Freq: Once | SUBCUTANEOUS | Status: AC
  Administered 2018-07-28: 60 mg via SUBCUTANEOUS

## 2018-07-28 NOTE — Progress Notes (Signed)
Patient in for Prolia injection per order from [name] NP.  Given 60 mg SQ left arm.   Patient tolerated well.

## 2018-07-28 NOTE — Telephone Encounter (Signed)
Prolia shot has been documented in prolia book as well as epic. Thank you Sheketia.

## 2018-07-28 NOTE — Telephone Encounter (Signed)
-----   Message from Doylene Canning, [place] sent at 07/28/2018  4:11 PM EST ----- Prolia shot given today.

## 2018-07-29 LAB — CUP PACEART INCLINIC DEVICE CHECK
Date Time Interrogation Session: 20200131114408
Date Time Interrogation Session: 20200131152537
Implantable Lead Implant Date: 20130319
Implantable Lead Implant Date: 20130319
Implantable Lead Implant Date: 20130319
Implantable Lead Location: 753859
Implantable Lead Location: 753860
Implantable Lead Location: 753859
Implantable Lead Location: 753860
Implantable Pulse Generator Implant Date: 20130319
Implantable Pulse Generator Implant Date: 20130319
MDC IDC LEAD IMPLANT DT: 20130319

## 2018-07-29 NOTE — Progress Notes (Signed)
CMA note reviewed.  Tamila Gaulin S O'Sullivan NP 

## 2018-08-02 ENCOUNTER — Other Ambulatory Visit: Payer: Self-pay | Admitting: Family

## 2018-08-09 ENCOUNTER — Other Ambulatory Visit: Payer: Self-pay | Admitting: Family

## 2018-08-17 ENCOUNTER — Telehealth: Payer: Self-pay

## 2018-08-17 NOTE — Telephone Encounter (Signed)
Copied from [place] [id number]. Topic: Appointment Scheduling - Transfer of Care >> Aug 17, 2018 12:16 PM Judyann Munson wrote: Pt is requesting to transfer FROM:  [name]  Pt is requesting to transfer TO: [name]  Reason for requested transfer:  the office in [place] is to far for her to travel and the [place] office is closer   Send CRM to patient's current PCP (transferring FROM).

## 2018-08-18 NOTE — Telephone Encounter (Signed)
OK with me.

## 2018-08-24 NOTE — Telephone Encounter (Signed)
Not sure how where to send this? Got this last week but was waiting to hear a plan before I responded. Thanks!

## 2018-08-24 NOTE — Telephone Encounter (Signed)
I have patient transferred to [name].

## 2018-08-30 ENCOUNTER — Other Ambulatory Visit: Payer: Self-pay | Admitting: Physician Assistant

## 2018-09-06 DIAGNOSIS — H518 Other specified disorders of binocular movement: Secondary | ICD-10-CM | POA: Diagnosis not present

## 2018-09-06 DIAGNOSIS — H532 Diplopia: Secondary | ICD-10-CM | POA: Diagnosis not present

## 2018-09-06 DIAGNOSIS — H50012 Monocular esotropia, left eye: Secondary | ICD-10-CM | POA: Diagnosis not present

## 2018-09-06 DIAGNOSIS — H5000 Unspecified esotropia: Secondary | ICD-10-CM | POA: Diagnosis not present

## 2018-09-13 ENCOUNTER — Other Ambulatory Visit: Payer: Self-pay

## 2018-09-13 ENCOUNTER — Ambulatory Visit (INDEPENDENT_AMBULATORY_CARE_PROVIDER_SITE_OTHER): Payer: Medicare Other | Admitting: *Deleted

## 2018-09-13 DIAGNOSIS — I495 Sick sinus syndrome: Secondary | ICD-10-CM

## 2018-09-13 DIAGNOSIS — R001 Bradycardia, unspecified: Secondary | ICD-10-CM | POA: Diagnosis not present

## 2018-09-14 ENCOUNTER — Telehealth: Payer: Self-pay

## 2018-09-14 LAB — CUP PACEART REMOTE DEVICE CHECK
Battery Voltage: 2.95 V
Brady Statistic RA Percent Paced: 91.76 %
Date Time Interrogation Session: 20200318154012
Implantable Lead Implant Date: 20130319
Implantable Lead Implant Date: 20130319
Implantable Lead Location: 753859
Implantable Lead Location: 753860
Lead Channel Impedance Value: 472 Ohm
Lead Channel Sensing Intrinsic Amplitude: 0.931 mV
Lead Channel Sensing Intrinsic Amplitude: 15.009 mV
Lead Channel Setting Pacing Amplitude: 2 V
Lead Channel Setting Sensing Sensitivity: 0.9 mV
MDC IDC MSMT LEADCHNL RA IMPEDANCE VALUE: 360 Ohm
MDC IDC PG IMPLANT DT: 20130319

## 2018-09-14 NOTE — Telephone Encounter (Signed)
Spoke with patient to remind of missed remote transmission 

## 2018-09-19 ENCOUNTER — Encounter: Payer: Medicare Other | Admitting: Family

## 2018-09-21 NOTE — Progress Notes (Signed)
Remote pacemaker transmission.   

## 2018-10-18 ENCOUNTER — Encounter: Payer: Medicare Other | Admitting: Family

## 2018-10-31 DIAGNOSIS — Z1231 Encounter for screening mammogram for malignant neoplasm of breast: Secondary | ICD-10-CM | POA: Diagnosis not present

## 2018-10-31 DIAGNOSIS — Z6824 Body mass index (BMI) 24.0-24.9, adult: Secondary | ICD-10-CM | POA: Diagnosis not present

## 2018-10-31 DIAGNOSIS — Z779 Other contact with and (suspected) exposures hazardous to health: Secondary | ICD-10-CM | POA: Diagnosis not present

## 2018-11-08 ENCOUNTER — Telehealth: Payer: Self-pay | Admitting: Cardiovascular Disease

## 2018-11-08 NOTE — Telephone Encounter (Signed)
Smartphone/ consent given/ my chart via email/ pre reg completed

## 2018-11-09 ENCOUNTER — Telehealth: Payer: Self-pay

## 2018-11-09 ENCOUNTER — Telehealth (INDEPENDENT_AMBULATORY_CARE_PROVIDER_SITE_OTHER): Payer: Medicare Other | Admitting: Cardiovascular Disease

## 2018-11-09 VITALS — BP 155/100 | Ht 62.0 in | Wt 132.0 lb

## 2018-11-09 DIAGNOSIS — I10 Essential (primary) hypertension: Secondary | ICD-10-CM

## 2018-11-09 DIAGNOSIS — E785 Hyperlipidemia, unspecified: Secondary | ICD-10-CM | POA: Diagnosis not present

## 2018-11-09 DIAGNOSIS — R55 Syncope and collapse: Secondary | ICD-10-CM

## 2018-11-09 DIAGNOSIS — Z8679 Personal history of other diseases of the circulatory system: Secondary | ICD-10-CM

## 2018-11-09 DIAGNOSIS — I5032 Chronic diastolic (congestive) heart failure: Secondary | ICD-10-CM | POA: Diagnosis not present

## 2018-11-09 DIAGNOSIS — I9789 Other postprocedural complications and disorders of the circulatory system, not elsewhere classified: Secondary | ICD-10-CM

## 2018-11-09 DIAGNOSIS — I495 Sick sinus syndrome: Secondary | ICD-10-CM

## 2018-11-09 DIAGNOSIS — C8221 Follicular lymphoma grade III, unspecified, lymph nodes of head, face, and neck: Secondary | ICD-10-CM

## 2018-11-09 DIAGNOSIS — I4891 Unspecified atrial fibrillation: Secondary | ICD-10-CM

## 2018-11-09 DIAGNOSIS — R001 Bradycardia, unspecified: Secondary | ICD-10-CM

## 2018-11-09 DIAGNOSIS — Z950 Presence of cardiac pacemaker: Secondary | ICD-10-CM

## 2018-11-09 MED ORDER — HYDROCHLOROTHIAZIDE 12.5 MG PO CAPS: 12.5000 mg | ORAL_CAPSULE | Freq: Every day | ORAL | 2 refills | Status: DC

## 2018-11-09 NOTE — Telephone Encounter (Addendum)
Per Dr C Patient needs Lipid Profile.  Contacted patient and informed her that Dr C would like for her to have some lab work soon. She voiced understanding and stated she will have her labs done next week. Informed her that she needs to be fasting and gave her lab instructions. She voiced understanding.

## 2018-11-09 NOTE — Progress Notes (Signed)
Virtual Visit via Video Note   This visit type was conducted due to national recommendations for restrictions regarding the COVID-19 Pandemic (e.g. social distancing) in an effort to limit this patient's exposure and mitigate transmission in our community.  Due to her co-morbid illnesses, this patient is at least at moderate risk for complications without adequate follow up.  This format is felt to be most appropriate for this patient at this time.  All issues noted in this document were discussed and addressed.  A limited physical exam was performed with this format.  Please refer to the patient's chart for her consent to telehealth for [facility].   Date:  11/09/2018   ID:  [name], DOB May 21, 1935, MRN [id number]  Patient Location: Other:  work Provider Location: Home  PCP:  [name], NP  Cardiologist:  Sanda Klein, MD  Electrophysiologist:  None   Evaluation Performed:  Follow-Up Visit  Chief Complaint:  Pacemaker check, HTN  History of Present Illness:    [name] is a 84 y.o. female with history of sinus node dysfunction leading to pacemaker implantation, essential hypertension, hypercholesterolemia, rare episodes of vasovagal syncope and mild chronic diastolic heart failure (symptoms of heart failure became apparent only during high-volume IVIG infusion).  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  She denies any cardiovascular complaints. The patient specifically denies any chest pain at rest exertion, dyspnea at rest or with exertion, orthopnea, paroxysmal nocturnal dyspnea, syncope, palpitations, focal neurological deficits, intermittent claudication, lower extremity edema, unexplained weight gain, cough, hemoptysis or wheezing.  Her Medtronic Revo (2013) pacemaker download looks normal.  Her device is programmed AAIR.  Battery voltage is at 2.95 V (ERI 2.81 V).  She has 92%  atrial pacing.  A single 6 beat run of paroxysmal atrial tachycardia has been recorded.  There is no atrial fibrillation.  The heart rate histogram distribution appears appropriate and activity level is good at 4 hours a day.  Despite being on maximum dose lisinopril (as well as a small dose of metoprolol) she remains hypertensive with a diastolic blood pressure that today is 100 mmHg.  In the past she had edema with diltiazem.  Was prescribed atorvastatin in December for LDL cholesterol 147.  Has not yet had a fasting lipid profile recheck.  During the coronavirus pandemic she has had difficulty getting her usual Hizentra infusions.  The patient does not have symptoms concerning for COVID-19 infection (fever, chills, cough, or new shortness of breath).    Past Medical History:  Diagnosis Date  . Anemia    pt denies  . Arthritis   . Atrial fibrillation ([place])   . Cataract    bil  . Cystocele    history with rectocele, pt denies  . Dysrhythmia   . GERD (gastroesophageal reflux disease)   . H/O hiatal hernia   . Hemorrhoid   . History of pancreatitis    pt denies and denies as of today 08/09/2017  . Hypertension   . Hypothyroidism   . Non Hodgkin's lymphoma ([place])    dx 2012 and now in remission  . Osteoporosis   . Pacemaker 09/15/2011   Medtronic Revo  . Pericarditis   . Pleural effusion, bilateral    history of  . Pulmonary nodules    history of  . Scoliosis   . Varicose vein    Past Surgical History:  Procedure Laterality Date  . BREAST SURGERY  07/30/01   biopsy x 3, bilateral  . CATARACT EXTRACTION Bilateral   . COLONOSCOPY    . MASS EXCISION Right 10/28/2012   Procedure: Removal of mass on neck       ;  Surgeon: Adin Hector, MD;  Location: [place];  Service: General;  Laterality: Right;  . PARTIAL HYSTERECTOMY  04/29/2004  . PERMANENT PACEMAKER INSERTION N/A 09/15/2011   Procedure: PERMANENT PACEMAKER INSERTION;  Surgeon: Sanda Klein, MD;  Location: [facility];   Service: Cardiovascular;  Laterality: N/A;  . RADIOACTIVE SEED GUIDED EXCISIONAL BREAST BIOPSY Left 08/11/2017   Procedure: LEFT RADIOACTIVE SEED GUIDED EXCISIONAL BREAST BIOPSY ERAS PATHWAY;  Surgeon: Stark Klein, MD;  Location: [facility];  Service: General;  Laterality: Left;  . TONSILLECTOMY       Current Meds  Medication Sig  . acetaminophen (TYLENOL) 500 MG tablet Take 500 mg by mouth every 6 (six) hours as needed (for pain.).   [name] Calcium Carbonate-Vitamin D (CALTRATE 600+D) 600-400 MG-UNIT per tablet Take 1 tablet by mouth 2 (two) times daily.  [name] denosumab (PROLIA) 60 MG/ML SOLN injection Inject 60 mg into the skin every 6 (six) months. Reported on 10/16/2015  . gabapentin (NEURONTIN) 300 MG capsule TAKE 1 CAPSULE(300 MG) BY MOUTH THREE TIMES DAILY AS NEEDED FOR NERVE PAIN  . HIZENTRA 4 GM/20ML SOLN Inject 4 g into the skin every Wednesday.   [name] ibuprofen (ADVIL,MOTRIN) 200 MG tablet Take 200-400 mg by mouth every 6 (six) hours as needed (for pain.).  [name] levothyroxine (SYNTHROID, LEVOTHROID) 112 MCG tablet Take 1 tablet (112 mcg total) by mouth daily.  [name] lisinopril (PRINIVIL,ZESTRIL) 20 MG tablet TAKE 1 TABLET(20 MG) BY MOUTH TWICE DAILY  . metoprolol tartrate (LOPRESSOR) 25 MG tablet Take 1 tablet (25 mg total) by mouth daily. May take 1/2 tablet extra if systolic BP is above 034  . omeprazole (PRILOSEC) 40 MG capsule TAKE 1 CAPSULE BY MOUTH ONCE DAILY     Allergies:   Amoxicillin; Buprenorphine; Codeine; Demerol; Morphine and related; Sulfonamide derivatives; and Vicodin [hydrocodone-acetaminophen]   Social History   Tobacco Use  . Smoking status: Never Smoker  . Smokeless tobacco: Never Used  Substance Use Topics  . Alcohol use: Yes    Comment: 1 glass of wine occasionally  . Drug use: No     Family Hx: The patient's family history includes Breast cancer in her sister; Heart disease in her brother and daughter; Lymphoma in her daughter; Stroke in her father and mother. There is  no history of Colon cancer, Esophageal cancer, Pancreatic cancer, Stomach cancer, or Liver disease.  ROS:   Please see the history of present illness.     All other systems reviewed and are negative.   Prior CV studies:   The following studies were reviewed today:  Pacemaker download August 16, 2018  Labs/Other Tests and Data Reviewed:    EKG:  An ECG dated 06/01/2018 was personally reviewed today and demonstrated:  Atrial paced, ventricular sensed rhythm  Recent Labs: 06/13/2018: ALT 17; Hemoglobin 13.1; Platelet Count 223 07/12/2018: BUN 12; Creatinine, Ser 0.72; Potassium 4.4; Sodium 142; TSH 5.17   Recent Lipid Panel Lab Results  Component Value Date/Time   CHOL 232 (H) 10/26/2017 10:35 AM   TRIG 196.0 (H) 10/26/2017 10:35 AM   HDL 46.60 10/26/2017 10:35 AM   CHOLHDL 5 10/26/2017 10:35 AM   LDLCALC 147 (H) 10/26/2017 10:35 AM    Wt Readings from Last 3 Encounters:  11/09/18 132 lb (59.9  kg)  07/12/18 132 lb (59.9 kg)  06/20/18 133 lb 12.8 oz (60.7 kg)     Objective:    Vital Signs:  BP (!) 155/100   Ht 5\' 2"  (1.575 m)   Wt 132 lb (59.9 kg)   BMI 24.14 kg/m    VITAL SIGNS:  reviewed GEN:  no acute distress EYES:  sclerae anicteric, EOMI - Extraocular Movements Intact RESPIRATORY:  normal respiratory effort, symmetric expansion CARDIOVASCULAR:  no peripheral edema SKIN:  no rash, lesions or ulcers. MUSCULOSKELETAL:  no obvious deformities. NEURO:  alert and oriented x 3, no obvious focal deficit PSYCH:  normal affect  ASSESSMENT & PLAN:    1. CHF: She has chronic diastolic heart failure, , but only requires diuretics when she receives volume loading with the IVIG infusions.  Reminded her to avoid excessive sodium in her diet.   2. HTN:  Elevated despite maximum dose lisinopril and a little beta-blocker.  Add hydrochlorothiazide 12.5 mg daily.  Watch carefully for signs of orthostatic hypotension or tendency to vasovagal syncope.  She developed edema when  she took calcium channel blockers in the past. 3. SSS: No symptoms of chronotropic incompetence, appropriate sensor settings by device check. 4. PM: Remote downloads every 3 months and yearly office visits. 5. History of post procedural acute pericarditis after pacemaker implantation, resolved. Had atrial fibrillation related to acute pericarditis, which has not recurred in years. No evidence of constrictive physiology on echo in 2018. She has not received chest radiation therapy for lymphoma, just chemotherapy.  Consider repeat echo or even cardiac MRI if signs of heart failure worsen, to look for signs of constriction.  6. HLP: Started on statin in December.  Recheck lipid profile.  COVID-19 Education: The signs and symptoms of COVID-19 were discussed with the patient and how to seek care for testing (follow up with PCP or arrange E-visit).  The importance of social distancing was discussed today.  Time:   Today, I have spent 21 minutes with the patient with telehealth technology discussing the above problems.     Medication Adjustments/Labs and Tests Ordered: Current medicines are reviewed at length with the patient today.  Concerns regarding medicines are outlined above.   Tests Ordered: No orders of the defined types were placed in this encounter.   Medication Changes: Meds ordered this encounter  Medications  . hydrochlorothiazide (MICROZIDE) 12.5 MG capsule    Sig: Take 1 capsule (12.5 mg total) by mouth daily.    Dispense:  90 capsule    Refill:  2    Disposition:  Follow up 6 months  Signed, Sanda Klein, MD  11/09/2018 9:16 AM    [place]

## 2018-11-09 NOTE — Patient Instructions (Addendum)
Medication Instructions:  START Hydrocholorthiazide 12.5mg  take 1 tablet once a day  If you need a refill on your cardiac medications before your next appointment, please call your pharmacy.   Lab work: None  If you have labs (blood work) drawn today and your tests are completely normal, you will receive your results only by: [name] MyChart Message (if you have MyChart) OR . A paper copy in the mail If you have any lab test that is abnormal or we need to change your treatment, we will call you to review the results.  Testing/Procedures: None   Follow-Up: At [facility], you and your health needs are our priority.  As part of our continuing mission to provide you with exceptional heart care, we have created designated Provider Care Teams.  These Care Teams include your primary Cardiologist (physician) and Advanced Practice Providers (APPs -  Physician Assistants and Nurse Practitioners) who all work together to provide you with the care you need, when you need it. You will need a follow up appointment in 6 months.  Please call our office 2 months in advance to schedule this appointment.  You may see Sanda Klein, MD or one of the following Advanced Practice Providers on your designated Care Team: [place], [place] . Fabian Sharp, PA-C  Any Other Special Instructions Will Be Listed Below (If Applicable). Please record your blood pressure readings for 3 weeks then call the office with the readings

## 2018-11-09 NOTE — Telephone Encounter (Signed)
Contacted patient to discuss AVS instructions. Patient agreeable with Dr Croitoru's recommendations and voiced understanding. 

## 2018-11-10 DIAGNOSIS — D839 Common variable immunodeficiency, unspecified: Secondary | ICD-10-CM | POA: Diagnosis not present

## 2018-11-17 DIAGNOSIS — D839 Common variable immunodeficiency, unspecified: Secondary | ICD-10-CM | POA: Diagnosis not present

## 2018-11-17 DIAGNOSIS — I10 Essential (primary) hypertension: Secondary | ICD-10-CM | POA: Diagnosis not present

## 2018-11-17 DIAGNOSIS — R55 Syncope and collapse: Secondary | ICD-10-CM | POA: Diagnosis not present

## 2018-11-17 LAB — LIPID PANEL
Chol/HDL Ratio: 3.5 ratio (ref 0.0–4.4)
Cholesterol, Total: 144 mg/dL (ref 100–199)
HDL: 41 mg/dL (ref >39)
LDL Calculated: 74 mg/dL (ref 0–99)
Triglycerides: 143 mg/dL (ref 0–149)
VLDL Cholesterol Cal: 29 mg/dL (ref 5–40)

## 2018-11-24 ENCOUNTER — Other Ambulatory Visit: Payer: Self-pay | Admitting: Physician Assistant

## 2018-11-24 DIAGNOSIS — D839 Common variable immunodeficiency, unspecified: Secondary | ICD-10-CM | POA: Diagnosis not present

## 2018-11-26 ENCOUNTER — Other Ambulatory Visit: Payer: Self-pay | Admitting: Physician Assistant

## 2018-12-01 DIAGNOSIS — D839 Common variable immunodeficiency, unspecified: Secondary | ICD-10-CM | POA: Diagnosis not present

## 2018-12-04 DIAGNOSIS — D839 Common variable immunodeficiency, unspecified: Secondary | ICD-10-CM | POA: Diagnosis not present

## 2018-12-06 ENCOUNTER — Telehealth: Payer: Self-pay | Admitting: Emergency Medicine

## 2018-12-06 ENCOUNTER — Other Ambulatory Visit: Payer: Self-pay

## 2018-12-06 MED ORDER — LEVOTHYROXINE SODIUM 112 MCG PO TABS: 112.0000 ug | ORAL_TABLET | Freq: Every day | ORAL | 3 refills | Status: DC

## 2018-12-06 NOTE — Telephone Encounter (Signed)
I have talked with [name]/cma with dr Edwena Blow today---they are sending in refill to patient's pharmacy, patient already advised when she was in our office today

## 2018-12-06 NOTE — Telephone Encounter (Signed)
Pt is request a refill on her levothyroxine (SYNTHROID, LEVOTHROID) 112 MCG tablet. Pharmacy is [facility] Pt stated she has called [facility] office and they refused to refill it since she is transferring care. Her transfer of care appt is scheduled for 12/13/2018 9:40 with Jodi Mourning. Thanks.

## 2018-12-11 DIAGNOSIS — D839 Common variable immunodeficiency, unspecified: Secondary | ICD-10-CM | POA: Diagnosis not present

## 2018-12-12 DIAGNOSIS — H532 Diplopia: Secondary | ICD-10-CM | POA: Diagnosis not present

## 2018-12-12 DIAGNOSIS — H52203 Unspecified astigmatism, bilateral: Secondary | ICD-10-CM | POA: Diagnosis not present

## 2018-12-12 DIAGNOSIS — Z961 Presence of intraocular lens: Secondary | ICD-10-CM | POA: Diagnosis not present

## 2018-12-12 DIAGNOSIS — H524 Presbyopia: Secondary | ICD-10-CM | POA: Diagnosis not present

## 2018-12-13 ENCOUNTER — Other Ambulatory Visit (INDEPENDENT_AMBULATORY_CARE_PROVIDER_SITE_OTHER): Payer: Medicare Other

## 2018-12-13 ENCOUNTER — Ambulatory Visit (INDEPENDENT_AMBULATORY_CARE_PROVIDER_SITE_OTHER): Payer: Medicare Other | Admitting: *Deleted

## 2018-12-13 ENCOUNTER — Ambulatory Visit (INDEPENDENT_AMBULATORY_CARE_PROVIDER_SITE_OTHER): Payer: Medicare Other | Admitting: Family

## 2018-12-13 ENCOUNTER — Telehealth: Payer: Self-pay | Admitting: Family

## 2018-12-13 ENCOUNTER — Other Ambulatory Visit: Payer: Self-pay

## 2018-12-13 ENCOUNTER — Encounter: Payer: Self-pay | Admitting: Family

## 2018-12-13 VITALS — BP 146/82 | HR 89 | Temp 98.2°F | Ht 62.0 in | Wt 136.4 lb

## 2018-12-13 DIAGNOSIS — E039 Hypothyroidism, unspecified: Secondary | ICD-10-CM | POA: Diagnosis not present

## 2018-12-13 DIAGNOSIS — R300 Dysuria: Secondary | ICD-10-CM | POA: Diagnosis not present

## 2018-12-13 DIAGNOSIS — I495 Sick sinus syndrome: Secondary | ICD-10-CM

## 2018-12-13 DIAGNOSIS — C8221 Follicular lymphoma grade III, unspecified, lymph nodes of head, face, and neck: Secondary | ICD-10-CM

## 2018-12-13 DIAGNOSIS — E785 Hyperlipidemia, unspecified: Secondary | ICD-10-CM | POA: Diagnosis not present

## 2018-12-13 DIAGNOSIS — I10 Essential (primary) hypertension: Secondary | ICD-10-CM | POA: Diagnosis not present

## 2018-12-13 DIAGNOSIS — M810 Age-related osteoporosis without current pathological fracture: Secondary | ICD-10-CM

## 2018-12-13 DIAGNOSIS — K219 Gastro-esophageal reflux disease without esophagitis: Secondary | ICD-10-CM

## 2018-12-13 LAB — URINALYSIS
Bilirubin Urine: NEGATIVE
Hgb urine dipstick: NEGATIVE
Ketones, ur: NEGATIVE
Leukocytes,Ua: NEGATIVE
Nitrite: NEGATIVE
Specific Gravity, Urine: 1.01 (ref 1.000–1.030)
Total Protein, Urine: NEGATIVE
Urine Glucose: NEGATIVE
Urobilinogen, UA: 0.2 (ref 0.0–1.0)
pH: 7 (ref 5.0–8.0)

## 2018-12-13 LAB — CUP PACEART REMOTE DEVICE CHECK
Battery Voltage: 2.93 V
Brady Statistic RA Percent Paced: 96.91 %
Date Time Interrogation Session: 20200616154050
Implantable Lead Implant Date: 20130319
Implantable Lead Implant Date: 20130319
Implantable Lead Location: 753859
Implantable Lead Location: 753860
Implantable Pulse Generator Implant Date: 20130319
Lead Channel Impedance Value: 376 Ohm
Lead Channel Impedance Value: 472 Ohm
Lead Channel Sensing Intrinsic Amplitude: 1.02 mV
Lead Channel Sensing Intrinsic Amplitude: 14.327 mV
Lead Channel Setting Pacing Amplitude: 2 V
Lead Channel Setting Sensing Sensitivity: 0.9 mV

## 2018-12-13 LAB — POC URINALSYSI DIPSTICK (AUTOMATED)
Bilirubin, UA: NEGATIVE
Blood, UA: NEGATIVE
Glucose, UA: NEGATIVE
Ketones, UA: NEGATIVE
Leukocytes, UA: NEGATIVE
Nitrite, UA: NEGATIVE
Protein, UA: NEGATIVE
Spec Grav, UA: 1.01 (ref 1.010–1.025)
Urobilinogen, UA: 0.2 E.U./dL
pH, UA: 6.5 (ref 5.0–8.0)

## 2018-12-13 MED ORDER — OMEPRAZOLE 40 MG PO CPDR: 40.0000 mg | DELAYED_RELEASE_CAPSULE | Freq: Every day | ORAL | 3 refills | Status: DC

## 2018-12-13 MED ORDER — NITROFURANTOIN MONOHYD MACRO 100 MG PO CAPS: 100.0000 mg | ORAL_CAPSULE | Freq: Two times a day (BID) | ORAL | 0 refills | Status: DC

## 2018-12-13 NOTE — Progress Notes (Signed)
[name] is a 84 y.o. female with the following history as recorded in EpicCare:  Patient Active Problem List   Diagnosis Date Noted  . Chronic diarrhea 02/24/2018  . Postoperative atrial fibrillation ([place]) 01/14/2018  . Post-traumatic pericarditis 01/14/2018  . Vasovagal syncope 04/09/2017  . Right shoulder injury, initial encounter 02/25/2017  . Hypertropia of left eye 09/09/2016  . Monocular esotropia of left eye 09/09/2016  . Chronic diastolic heart failure ([place]) 05/29/2016  . History of pericarditis 05/29/2016  . Sinus node dysfunction ([place]) 02/05/2016  . Insomnia 10/16/2015  . Grade 3 follicular lymphoma of lymph nodes of neck ([place]) 06/06/2015  . Peripheral neuropathy 12/26/2014  . Heart palpitations 06/04/2014  . Cyst in hand 03/14/2014  . Hearing loss 04/27/2013  . Hip pain 04/27/2013  . Need for prophylactic vaccination and inoculation against influenza 04/27/2013  . Multinodular goiter 02/21/2013  . Pacemaker 02/11/2013  . Pericardial effusion after pacemaker, hospitalized at [facility] 09/25/11 09/29/2011  . Dyslipidemia, LDL 136 08/21/2011  . Hiatal hernia, large by CXR 08/21/2011  . Scoliosis 08/21/2011  . NHL (non-Hodgkin's lymphoma)-recent diagnosis 07/09/2011  . Anxiety 07/09/2011  . Sinus bradycardia, symptomatic, s/p MDT PTVDP 09/15/11 02/11/2011  . Essential hypertension 01/26/2011  . Osteoporosis 09/20/2009  . DIASTOLIC DYSFUNCTION, on 2D Jan 2011 (grade 1) 08/13/2009  . PAP SMEAR, ABNORMAL 07/04/2009  . BACK PAIN 12/07/2008  . THYROID NODULE, RIGHT 10/30/2008  . Hypothyroidism 10/30/2008  . ADJUSTMENT DISORDER WITH DEPRESSED MOOD 10/30/2008  . GERD 10/30/2008    Current Outpatient Medications  Medication Sig Dispense Refill  . acetaminophen (TYLENOL) 500 MG tablet Take 500 mg by mouth every 6 (six) hours as needed (for pain.).     [name] Calcium Carbonate-Vitamin D (CALTRATE 600+D) 600-400 MG-UNIT per tablet Take 1 tablet by mouth 2 (two) times  daily.    . chlorthalidone (HYGROTON) 25 MG tablet TK 1/2 T PO QD    . denosumab (PROLIA) 60 MG/ML SOLN injection Inject 60 mg into the skin every 6 (six) months. Reported on 10/16/2015    . furosemide (LASIX) 20 MG tablet Take 20 mg by mouth daily.    [name] gabapentin (NEURONTIN) 300 MG capsule TAKE 1 CAPSULE(300 MG) BY MOUTH THREE TIMES DAILY AS NEEDED FOR NERVE PAIN 90 capsule 5  . HIZENTRA 4 GM/20ML SOLN Inject 4 g into the skin every Wednesday.     . hydrochlorothiazide (MICROZIDE) 12.5 MG capsule Take 1 capsule (12.5 mg total) by mouth daily. 90 capsule 2  . ibuprofen (ADVIL,MOTRIN) 200 MG tablet Take 200-400 mg by mouth every 6 (six) hours as needed (for pain.).    [name] levothyroxine (SYNTHROID) 112 MCG tablet Take 1 tablet (112 mcg total) by mouth daily. 30 tablet 3  . lisinopril (ZESTRIL) 20 MG tablet TAKE 1 TABLET(20 MG) BY MOUTH TWICE DAILY 60 tablet 9  . metoprolol tartrate (LOPRESSOR) 25 MG tablet Take 1 tablet (25 mg total) by mouth daily. May take 1/2 tablet extra if systolic BP is above 500 90 tablet 1  . olopatadine (PATANOL) 0.1 % ophthalmic solution Place 1 drop into both eyes 2 times daily.    [name] omeprazole (PRILOSEC) 40 MG capsule Take 1 capsule (40 mg total) by mouth daily. 90 capsule 3  . potassium chloride (K-DUR) 10 MEQ tablet Take 10 mEq by mouth daily.    [name] atorvastatin (LIPITOR) 40 MG tablet Take 1 tablet (40 mg total) by mouth daily. 90 tablet 3  . Influenza Vac A&B Surf Ant Adj (FLUAD)  0.5 ML SUSY Fluad 2019-20 39yr up(PF)45 mcg(15 mcgx3)/0.5 mL intramuscular syringe  ADM 0.5ML IM UTD    . nitrofurantoin, macrocrystal-monohydrate, (MACROBID) 100 MG capsule Take 1 capsule (100 mg total) by mouth 2 (two) times daily. 14 capsule 0   No current facility-administered medications for this visit.     Allergies: Amoxicillin, Buprenorphine, Codeine, Demerol, Morphine and related, Sulfonamide derivatives, and Vicodin [hydrocodone-acetaminophen]  Past Medical History:  Diagnosis Date   . Anemia    pt denies  . Arthritis   . Atrial fibrillation ([place])   . Cataract    bil  . Cystocele    history with rectocele, pt denies  . Dysrhythmia   . GERD (gastroesophageal reflux disease)   . H/O hiatal hernia   . Hemorrhoid   . History of pancreatitis    pt denies and denies as of today 08/09/2017  . Hypertension   . Hypothyroidism   . Non Hodgkin's lymphoma ([place])    dx 2012 and now in remission  . Osteoporosis   . Pacemaker 09/15/2011   Medtronic Revo  . Pericarditis   . Pleural effusion, bilateral    history of  . Pulmonary nodules    history of  . Scoliosis   . Varicose vein     Past Surgical History:  Procedure Laterality Date  . BREAST SURGERY  07/30/01   biopsy x 3, bilateral  . CATARACT EXTRACTION Bilateral   . COLONOSCOPY    . MASS EXCISION Right 10/28/2012   Procedure: Removal of mass on neck       ;  Surgeon: Adin Hector, MD;  Location: [facility];  Service: General;  Laterality: Right;  . PARTIAL HYSTERECTOMY  04/29/2004  . PERMANENT PACEMAKER INSERTION N/A 09/15/2011   Procedure: PERMANENT PACEMAKER INSERTION;  Surgeon: Sanda Klein, MD;  Location: [facility] C[facility];  Service: Cardiovascular;  Laterality: N/A;  . RADIOACTIVE SEED GUIDED EXCISIONAL BREAST BIOPSY Left 08/11/2017   Procedure: LEFT RADIOACTIVE SEED GUIDED EXCISIONAL BREAST BIOPSY ERAS PATHWAY;  Surgeon: Stark Klein, MD;  Location: [facility];  Service: General;  Laterality: Left;  . TONSILLECTOMY      Family History  Problem Relation Age of Onset  . Stroke Mother   . Stroke Father   . Heart disease Daughter        congenital "whole in her heart"  . Lymphoma Daughter        nonhodgkins  . Breast cancer Sister   . Heart disease Brother   . Colon cancer Neg Hx   . Esophageal cancer Neg Hx   . Pancreatic cancer Neg Hx   . Stomach cancer Neg Hx   . Liver disease Neg Hx     Social History   Tobacco Use  . Smoking status: Never Smoker  . Smokeless tobacco: Never Used  Substance Use Topics  .  Alcohol use: Yes    Comment: 1 glass of wine occasionally    Subjective:  Patient presents to transfer care from another PCP in our system; Has numerous specialists- under care of cardiology for hypertension, rheumatology in [place]; history of lymphoma- in remission/ under care of Dr. Earlie Server; sees them every 6 months; History of osteoporosis- not sure when last Prolia injection was done; Fees like she has a UTI today- took left over Amoxicillin with some relief; would like to get her urine check today; Has history of hypothyroidism- has been off her medication for the past few weeks/ was having hard time getting her prescription refilled  Objective:  Vitals:   12/13/18 0953  BP: (!) 146/82  Pulse: 89  Temp: 98.2 F (36.8 C)  TempSrc: Oral  SpO2: 97%  Weight: 136 lb 6.4 oz (61.9 kg)  Height: 5\' 2"  (1.575 m)    General: Well developed, well nourished, in no acute distress  Skin : Warm and dry.  Head: Normocephalic and atraumatic  Lungs: Respirations unlabored; clear to auscultation bilaterally without wheeze, rales, rhonchi  CVS exam: normal rate and regular rhythm.  Neurologic: Alert and oriented; speech intact; face symmetrical; moves all extremities well; CNII-XII intact without focal deficit   Assessment:  1. Hypothyroidism, unspecified type   2. Hyperlipidemia, unspecified hyperlipidemia type   3. Dysuria   4. Essential hypertension   5. Grade 3 follicular lymphoma of lymph nodes of neck (HCC)   6. Osteoporosis, unspecified osteoporosis type, unspecified pathological fracture presence   7. Gastroesophageal reflux disease, esophagitis presence not specified     Plan:  1. Stay on Synthroid; will need to get labs updated in 6-8 weeks; 2. Prescription written by cardiology; check lipid panel in 6-8 weeks per patient request. 3. Check U/A and urine culture; Rx for Macrobid 100 mg bid x 7 days; 4. Stable; continue with cardiology; 5. Stable- in remission; continue  with oncology as scheduled; 6. Will plan to get Prolia approved for our office to start giving- will be due later this summer.  7. Refill updated on Omeprazole per patient request.   No follow-ups on file.  Orders Placed This Encounter  Procedures  . Urine Culture    Standing Status:   Future    Standing Expiration Date:   12/13/2019  . TSH    Standing Status:   Future    Standing Expiration Date:   12/13/2019  . Lipid panel    Standing Status:   Future    Standing Expiration Date:   12/13/2019  . Urinalysis    Standing Status:   Future    Standing Expiration Date:   12/13/2019  . POCT Urinalysis Dipstick (Automated)    Requested Prescriptions   Signed Prescriptions Disp Refills  . omeprazole (PRILOSEC) 40 MG capsule 90 capsule 3    Sig: Take 1 capsule (40 mg total) by mouth daily.  . nitrofurantoin, macrocrystal-monohydrate, (MACROBID) 100 MG capsule 14 capsule 0    Sig: Take 1 capsule (100 mg total) by mouth 2 (two) times daily.

## 2018-12-13 NOTE — Telephone Encounter (Signed)
[name],  This is the patient we discussed who is transferring from the [facility] office. Can you please verify her insurance for Prolia? Will need injection around August 1. Please let me know what I need to do.  Thanks-

## 2018-12-13 NOTE — Telephone Encounter (Signed)
Patient's insurance will be verified for summary of benefits, we will call patient to discuss and schedule----cannot be given before January 28, 2019 (last iinjection 07/28/2018 at [facility]

## 2018-12-15 LAB — URINE CULTURE
MICRO NUMBER:: 574377
Result:: NO GROWTH
SPECIMEN QUALITY:: ADEQUATE

## 2018-12-18 DIAGNOSIS — D839 Common variable immunodeficiency, unspecified: Secondary | ICD-10-CM | POA: Diagnosis not present

## 2018-12-23 ENCOUNTER — Encounter: Payer: Self-pay | Admitting: Cardiology

## 2018-12-23 NOTE — Progress Notes (Signed)
Remote pacemaker transmission.   

## 2018-12-25 DIAGNOSIS — D839 Common variable immunodeficiency, unspecified: Secondary | ICD-10-CM | POA: Diagnosis not present

## 2018-12-30 ENCOUNTER — Other Ambulatory Visit: Payer: Self-pay | Admitting: Cardiovascular Disease

## 2019-01-01 DIAGNOSIS — D839 Common variable immunodeficiency, unspecified: Secondary | ICD-10-CM | POA: Diagnosis not present

## 2019-01-08 DIAGNOSIS — D839 Common variable immunodeficiency, unspecified: Secondary | ICD-10-CM | POA: Diagnosis not present

## 2019-01-15 DIAGNOSIS — D839 Common variable immunodeficiency, unspecified: Secondary | ICD-10-CM | POA: Diagnosis not present

## 2019-01-22 DIAGNOSIS — D839 Common variable immunodeficiency, unspecified: Secondary | ICD-10-CM | POA: Diagnosis not present

## 2019-01-29 DIAGNOSIS — D839 Common variable immunodeficiency, unspecified: Secondary | ICD-10-CM | POA: Diagnosis not present

## 2019-02-05 DIAGNOSIS — D839 Common variable immunodeficiency, unspecified: Secondary | ICD-10-CM | POA: Diagnosis not present

## 2019-02-08 ENCOUNTER — Other Ambulatory Visit: Payer: Self-pay | Admitting: Family

## 2019-02-08 MED ORDER — GABAPENTIN 300 MG PO CAPS: ORAL_CAPSULE | ORAL | 5 refills | Status: DC

## 2019-02-12 DIAGNOSIS — D839 Common variable immunodeficiency, unspecified: Secondary | ICD-10-CM | POA: Diagnosis not present

## 2019-02-19 DIAGNOSIS — D839 Common variable immunodeficiency, unspecified: Secondary | ICD-10-CM | POA: Diagnosis not present

## 2019-02-26 DIAGNOSIS — D839 Common variable immunodeficiency, unspecified: Secondary | ICD-10-CM | POA: Diagnosis not present

## 2019-03-02 DIAGNOSIS — D839 Common variable immunodeficiency, unspecified: Secondary | ICD-10-CM | POA: Diagnosis not present

## 2019-03-03 DIAGNOSIS — D839 Common variable immunodeficiency, unspecified: Secondary | ICD-10-CM | POA: Diagnosis not present

## 2019-03-04 DIAGNOSIS — D839 Common variable immunodeficiency, unspecified: Secondary | ICD-10-CM | POA: Diagnosis not present

## 2019-03-05 DIAGNOSIS — D839 Common variable immunodeficiency, unspecified: Secondary | ICD-10-CM | POA: Diagnosis not present

## 2019-03-06 DIAGNOSIS — D839 Common variable immunodeficiency, unspecified: Secondary | ICD-10-CM | POA: Diagnosis not present

## 2019-03-07 DIAGNOSIS — D839 Common variable immunodeficiency, unspecified: Secondary | ICD-10-CM | POA: Diagnosis not present

## 2019-03-08 DIAGNOSIS — D839 Common variable immunodeficiency, unspecified: Secondary | ICD-10-CM | POA: Diagnosis not present

## 2019-03-09 ENCOUNTER — Other Ambulatory Visit: Payer: Self-pay | Admitting: Family

## 2019-03-09 DIAGNOSIS — D839 Common variable immunodeficiency, unspecified: Secondary | ICD-10-CM | POA: Diagnosis not present

## 2019-03-09 NOTE — Telephone Encounter (Signed)
Requested medication (s) are due for refill today: yes  Requested medication (s) are on the active medication list: yes  Last refill:  12/06/2018  Future visit scheduled: yes  Notes to clinic:  Review for refill Patient requested 3 days ago    Requested Prescriptions  Pending Prescriptions Disp Refills   levothyroxine (SYNTHROID) 112 MCG tablet 30 tablet 3    Sig: Take 1 tablet (112 mcg total) by mouth daily.     Endocrinology:  Hypothyroid Agents Failed - 03/09/2019 11:13 AM      Failed - TSH needs to be rechecked within 3 months after an abnormal result. Refill until TSH is due.      Failed - TSH in normal range and within 360 days    TSH  Date Value Ref Range Status  07/12/2018 5.17 (H) 0.35 - 4.50 uIU/mL Final         Passed - Valid encounter within last 12 months    Recent Outpatient Visits          2 months ago Hypothyroidism, unspecified type   [place], Marvis Repress, FNP   8 months ago Hypothyroidism, unspecified type   [facility] [place], NP   1 year ago Diarrhea, unspecified type   [facility] [place], [place]   1 year ago Essential hypertension   [facility] [place], NP   1 year ago Chronic cough   [facility], [place]her [place], DO      Future Appointments            In 2 months Croitoru, Dani Gobble, MD [place], [employer]

## 2019-03-09 NOTE — Telephone Encounter (Signed)
Routed to [place] today.

## 2019-03-09 NOTE — Telephone Encounter (Signed)
Pt request refill  levothyroxine (SYNTHROID) 112 MCG tablet  Pt called the pharmacy several days ago. They say they not heard from dr.  I do not see anything form pharmacy.  Pt states she has been out 3 days.  [facility] RO:[id number] [place], [place] [phone] (Phone) [phone] (Fax)

## 2019-03-10 DIAGNOSIS — D839 Common variable immunodeficiency, unspecified: Secondary | ICD-10-CM | POA: Diagnosis not present

## 2019-03-10 NOTE — Telephone Encounter (Signed)
She needs to come get her labs done so we can make sure she is taking the correct dosage.

## 2019-03-11 DIAGNOSIS — D839 Common variable immunodeficiency, unspecified: Secondary | ICD-10-CM | POA: Diagnosis not present

## 2019-03-12 DIAGNOSIS — D839 Common variable immunodeficiency, unspecified: Secondary | ICD-10-CM | POA: Diagnosis not present

## 2019-03-13 DIAGNOSIS — D839 Common variable immunodeficiency, unspecified: Secondary | ICD-10-CM | POA: Diagnosis not present

## 2019-03-14 ENCOUNTER — Ambulatory Visit (INDEPENDENT_AMBULATORY_CARE_PROVIDER_SITE_OTHER): Payer: Medicare Other | Admitting: *Deleted

## 2019-03-14 DIAGNOSIS — I9789 Other postprocedural complications and disorders of the circulatory system, not elsewhere classified: Secondary | ICD-10-CM

## 2019-03-14 DIAGNOSIS — D839 Common variable immunodeficiency, unspecified: Secondary | ICD-10-CM | POA: Diagnosis not present

## 2019-03-14 DIAGNOSIS — I4891 Unspecified atrial fibrillation: Secondary | ICD-10-CM | POA: Diagnosis not present

## 2019-03-14 DIAGNOSIS — I495 Sick sinus syndrome: Secondary | ICD-10-CM

## 2019-03-14 LAB — CUP PACEART REMOTE DEVICE CHECK
Battery Voltage: 2.93 V
Brady Statistic RA Percent Paced: 96.8 %
Date Time Interrogation Session: 20200915200322
Implantable Lead Implant Date: 20130319
Implantable Lead Implant Date: 20130319
Implantable Lead Location: 753859
Implantable Lead Location: 753860
Implantable Pulse Generator Implant Date: 20130319
Lead Channel Impedance Value: 424 Ohm
Lead Channel Impedance Value: 496 Ohm
Lead Channel Sensing Intrinsic Amplitude: 1.375 mV
Lead Channel Sensing Intrinsic Amplitude: 17.397 mV
Lead Channel Setting Pacing Amplitude: 2 V
Lead Channel Setting Sensing Sensitivity: 0.9 mV

## 2019-03-15 DIAGNOSIS — D839 Common variable immunodeficiency, unspecified: Secondary | ICD-10-CM | POA: Diagnosis not present

## 2019-03-16 ENCOUNTER — Other Ambulatory Visit: Payer: Self-pay | Admitting: *Deleted

## 2019-03-16 DIAGNOSIS — D839 Common variable immunodeficiency, unspecified: Secondary | ICD-10-CM | POA: Diagnosis not present

## 2019-03-16 MED ORDER — LEVOTHYROXINE SODIUM 112 MCG PO TABS: 112.0000 ug | ORAL_TABLET | Freq: Every day | ORAL | 0 refills | Status: DC

## 2019-03-17 DIAGNOSIS — D839 Common variable immunodeficiency, unspecified: Secondary | ICD-10-CM | POA: Diagnosis not present

## 2019-03-18 DIAGNOSIS — D839 Common variable immunodeficiency, unspecified: Secondary | ICD-10-CM | POA: Diagnosis not present

## 2019-03-19 DIAGNOSIS — D839 Common variable immunodeficiency, unspecified: Secondary | ICD-10-CM | POA: Diagnosis not present

## 2019-03-20 DIAGNOSIS — D839 Common variable immunodeficiency, unspecified: Secondary | ICD-10-CM | POA: Diagnosis not present

## 2019-03-21 DIAGNOSIS — D839 Common variable immunodeficiency, unspecified: Secondary | ICD-10-CM | POA: Diagnosis not present

## 2019-03-21 NOTE — Progress Notes (Signed)
Remote pacemaker transmission.   

## 2019-03-22 DIAGNOSIS — D839 Common variable immunodeficiency, unspecified: Secondary | ICD-10-CM | POA: Diagnosis not present

## 2019-03-23 DIAGNOSIS — D839 Common variable immunodeficiency, unspecified: Secondary | ICD-10-CM | POA: Diagnosis not present

## 2019-03-24 DIAGNOSIS — D839 Common variable immunodeficiency, unspecified: Secondary | ICD-10-CM | POA: Diagnosis not present

## 2019-03-25 DIAGNOSIS — D839 Common variable immunodeficiency, unspecified: Secondary | ICD-10-CM | POA: Diagnosis not present

## 2019-03-26 DIAGNOSIS — D839 Common variable immunodeficiency, unspecified: Secondary | ICD-10-CM | POA: Diagnosis not present

## 2019-03-30 ENCOUNTER — Other Ambulatory Visit: Payer: Self-pay | Admitting: Cardiovascular Disease

## 2019-04-02 DIAGNOSIS — D839 Common variable immunodeficiency, unspecified: Secondary | ICD-10-CM | POA: Diagnosis not present

## 2019-04-09 DIAGNOSIS — D839 Common variable immunodeficiency, unspecified: Secondary | ICD-10-CM | POA: Diagnosis not present

## 2019-04-16 DIAGNOSIS — D839 Common variable immunodeficiency, unspecified: Secondary | ICD-10-CM | POA: Diagnosis not present

## 2019-04-23 DIAGNOSIS — D839 Common variable immunodeficiency, unspecified: Secondary | ICD-10-CM | POA: Diagnosis not present

## 2019-04-30 DIAGNOSIS — D839 Common variable immunodeficiency, unspecified: Secondary | ICD-10-CM | POA: Diagnosis not present

## 2019-05-01 ENCOUNTER — Other Ambulatory Visit: Payer: Self-pay | Admitting: Family

## 2019-05-01 MED ORDER — LEVOTHYROXINE SODIUM 112 MCG PO TABS: 112.0000 ug | ORAL_TABLET | Freq: Every day | ORAL | 0 refills | Status: DC

## 2019-05-01 NOTE — Telephone Encounter (Signed)
Please remind her she needs to come get her TSH re-checked. This was discussed at her OV in June and she was due to get the lab checked in September. I cannot do a 90 day supply until I am sure her level is correct. Lab is in place for her to come get done.

## 2019-05-01 NOTE — Telephone Encounter (Signed)
Requested medication (s) are due for refill today: yes  Requested medication (s) are on the active medication list: yes  Last refill:  03/16/2019  Future visit scheduled: no  Notes to clinic:  Patient requesting 90 day   Requested Prescriptions  Pending Prescriptions Disp Refills   levothyroxine (SYNTHROID) 112 MCG tablet 30 tablet 0    Sig: Take 1 tablet (112 mcg total) by mouth daily.     Endocrinology:  Hypothyroid Agents Failed - 05/01/2019 11:25 AM      Failed - TSH needs to be rechecked within 3 months after an abnormal result. Refill until TSH is due.      Failed - TSH in normal range and within 360 days    TSH  Date Value Ref Range Status  07/12/2018 5.17 (H) 0.35 - 4.50 uIU/mL Final         Passed - Valid encounter within last 12 months    Recent Outpatient Visits          4 months ago Hypothyroidism, unspecified type   [facility], Marvis Repress, FNP   9 months ago Hypothyroidism, unspecified type   [facility], NP   1 year ago Diarrhea, unspecified type   [facility] [place], [place]   1 year ago Essential hypertension   [facility], NP   1 year ago Chronic cough   [facility] at [facility], [place], [place]      Future Appointments            In 4 weeks Croitoru, Dani Gobble, MD [facility], [facility]

## 2019-05-01 NOTE — Telephone Encounter (Signed)
Medication Refill - Medication: levothyroxine (SYNTHROID) 112 MCG tablet  Pt requesting 90 day supply  Preferred Pharmacy : [facility] [phone] [place], [place]  [address] [place] [address]  Phone: [phone] Fax: [phone]     Pt was advised that RX refills may take up to 3 business days. We ask that you follow-up with your pharmacy.

## 2019-05-02 NOTE — Telephone Encounter (Signed)
Spoke with patient and info given. She knows to come and get labs rechecked before she can receive any future refills.

## 2019-05-03 ENCOUNTER — Other Ambulatory Visit (INDEPENDENT_AMBULATORY_CARE_PROVIDER_SITE_OTHER): Payer: Medicare Other

## 2019-05-03 DIAGNOSIS — E039 Hypothyroidism, unspecified: Secondary | ICD-10-CM

## 2019-05-03 DIAGNOSIS — E785 Hyperlipidemia, unspecified: Secondary | ICD-10-CM

## 2019-05-03 LAB — LIPID PANEL
Cholesterol: 129 mg/dL (ref 0–200)
HDL: 39.8 mg/dL (ref >39.00)
LDL Cholesterol: 69 mg/dL (ref 0–99)
NonHDL: 88.83
Total CHOL/HDL Ratio: 3
Triglycerides: 97 mg/dL (ref 0.0–149.0)
VLDL: 19.4 mg/dL (ref 0.0–40.0)

## 2019-05-03 LAB — TSH: TSH: 0.31 u[IU]/mL — ABNORMAL LOW (ref 0.35–4.50)

## 2019-05-04 ENCOUNTER — Other Ambulatory Visit: Payer: Self-pay | Admitting: Family

## 2019-05-04 MED ORDER — LEVOTHYROXINE SODIUM 100 MCG PO TABS: 100.0000 ug | ORAL_TABLET | Freq: Every day | ORAL | 0 refills | Status: DC

## 2019-05-07 DIAGNOSIS — D839 Common variable immunodeficiency, unspecified: Secondary | ICD-10-CM | POA: Diagnosis not present

## 2019-05-14 DIAGNOSIS — D839 Common variable immunodeficiency, unspecified: Secondary | ICD-10-CM | POA: Diagnosis not present

## 2019-05-21 DIAGNOSIS — D839 Common variable immunodeficiency, unspecified: Secondary | ICD-10-CM | POA: Diagnosis not present

## 2019-05-28 DIAGNOSIS — D839 Common variable immunodeficiency, unspecified: Secondary | ICD-10-CM | POA: Diagnosis not present

## 2019-05-29 ENCOUNTER — Other Ambulatory Visit: Payer: Self-pay | Admitting: Physician Assistant

## 2019-05-30 ENCOUNTER — Other Ambulatory Visit: Payer: Self-pay

## 2019-05-30 ENCOUNTER — Ambulatory Visit (INDEPENDENT_AMBULATORY_CARE_PROVIDER_SITE_OTHER): Payer: Medicare Other | Admitting: Cardiovascular Disease

## 2019-05-30 ENCOUNTER — Encounter: Payer: Self-pay | Admitting: Cardiovascular Disease

## 2019-05-30 VITALS — BP 142/82 | HR 71 | Ht 62.0 in | Wt 141.0 lb

## 2019-05-30 DIAGNOSIS — I5032 Chronic diastolic (congestive) heart failure: Secondary | ICD-10-CM | POA: Diagnosis not present

## 2019-05-30 DIAGNOSIS — I495 Sick sinus syndrome: Secondary | ICD-10-CM | POA: Diagnosis not present

## 2019-05-30 DIAGNOSIS — Z950 Presence of cardiac pacemaker: Secondary | ICD-10-CM

## 2019-05-30 DIAGNOSIS — I480 Paroxysmal atrial fibrillation: Secondary | ICD-10-CM | POA: Diagnosis not present

## 2019-05-30 DIAGNOSIS — I10 Essential (primary) hypertension: Secondary | ICD-10-CM | POA: Diagnosis not present

## 2019-05-30 DIAGNOSIS — Z8679 Personal history of other diseases of the circulatory system: Secondary | ICD-10-CM

## 2019-05-30 NOTE — Progress Notes (Signed)
Cardiology Office Note    Date:  06/01/2019   ID:  Ji, Cancilla 01-20-1935, MRN [id number]  PCP:  Marrian Salvage, FNP  Cardiologist:   Sanda Klein, MD   Chief Complaint  Patient presents with  . Atrial Fibrillation  . Pacemaker Check    History of Present Illness:  [name] is a 84 y.o. female with sinus node dysfunction and implantation of a dual-chamber permanent pacemaker (Medtronic Revo 2013, programmed AAIR to avoid ventricular paced beats), non-Hodgkin's lymphoma in remission.  The patient specifically denies any chest pain at rest exertion, dyspnea at rest or with exertion, orthopnea, paroxysmal nocturnal dyspnea, syncope, palpitations, focal neurological deficits, intermittent claudication, lower extremity edema, unexplained weight gain, cough, hemoptysis or wheezing.  Shortly after pacemaker implantation she had problems with paroxysmal atrial fibrillation likely related to microperforation and acute pericarditis.  For many years or after she has not had recurrent atrial fibrillation.  Her pacemaker interrogation today shows a single episode of atrial fibrillation that occurred in October and lasted for 90 seconds only.  Rate was spontaneously controlled and the episode was asymptomatic.  Otherwise she is only had 1 to 3-second episode of paroxysmal atrial tachycardia that occurred on November 11.  Pacemaker function is otherwise normal.  Battery voltage is 2.92 V (RRT 2.81 V).  Her device is programmed AAIR to avoid unnecessary ventricular pacing.  She has 97% atrial paced rhythm with satisfactory heart rate histogram distribution.  She did have some shortness of breath in 2018 that responded promptly to diuretics. September 2019, she had a single syncopal events shortly after arriving in [place] after a long airline trip. Echo showed normal left ventricular systolic function but evidence of diastolic dysfunction and increased filling pressure.  She  developed problems with hypervolemia while receiving IVIG (Hizentra) infusions.  She is no longer taking statins.  Despite this, her lipid profile a few weeks ago showed an LDL cholesterol of 69.  Her medication list contains both chlorthalidone and hydrochlorothiazide.  We will try to clarify whether she is truly taking both or just one of them.  Past Medical History:  Diagnosis Date  . Anemia    pt denies  . Arthritis   . Atrial fibrillation ([place])   . Cataract    bil  . Cystocele    history with rectocele, pt denies  . Dysrhythmia   . GERD (gastroesophageal reflux disease)   . H/O hiatal hernia   . Hemorrhoid   . History of pancreatitis    pt denies and denies as of today 08/09/2017  . Hypertension   . Hypothyroidism   . Non Hodgkin's lymphoma ([place])    dx 2012 and now in remission  . Osteoporosis   . Pacemaker 09/15/2011   Medtronic Revo  . Pericarditis   . Pleural effusion, bilateral    history of  . Pulmonary nodules    history of  . Scoliosis   . Varicose vein     Past Surgical History:  Procedure Laterality Date  . BREAST SURGERY  07/30/01   biopsy x 3, bilateral  . CATARACT EXTRACTION Bilateral   . COLONOSCOPY    . MASS EXCISION Right 10/28/2012   Procedure: Removal of mass on neck       ;  Surgeon: Adin Hector, MD;  Location: [facility];  Service: General;  Laterality: Right;  . PARTIAL HYSTERECTOMY  04/29/2004  . PERMANENT PACEMAKER INSERTION N/A 09/15/2011   Procedure: PERMANENT PACEMAKER INSERTION;  Surgeon:  Sanda Klein, MD;  Location: [facility] CATH [facility];  Service: Cardiovascular;  Laterality: N/A;  . RADIOACTIVE SEED GUIDED EXCISIONAL BREAST BIOPSY Left 08/11/2017   Procedure: LEFT RADIOACTIVE SEED GUIDED EXCISIONAL BREAST BIOPSY ERAS PATHWAY;  Surgeon: Stark Klein, MD;  Location: [facility];  Service: General;  Laterality: Left;  . TONSILLECTOMY      Current Medications: Outpatient Medications Prior to Visit  Medication Sig Dispense Refill  . acetaminophen  (TYLENOL) 500 MG tablet Take 500 mg by mouth every 6 (six) hours as needed (for pain.).     [name] Calcium Carbonate-Vitamin D (CALTRATE 600+D) 600-400 MG-UNIT per tablet Take 1 tablet by mouth 2 (two) times daily.    . chlorthalidone (HYGROTON) 25 MG tablet TK 1/2 T PO QD    . gabapentin (NEURONTIN) 300 MG capsule TAKE 1 CAPSULE(300 MG) BY MOUTH THREE TIMES DAILY AS NEEDED FOR NERVE PAIN 90 capsule 5  . HIZENTRA 4 GM/20ML SOLN Inject 4 g into the skin every Wednesday.     [name] ibuprofen (ADVIL,MOTRIN) 200 MG tablet Take 200-400 mg by mouth every 6 (six) hours as needed (for pain.).    [name] levothyroxine (SYNTHROID) 100 MCG tablet Take 1 tablet (100 mcg total) by mouth daily. 90 tablet 0  . lisinopril (ZESTRIL) 20 MG tablet TAKE 1 TABLET(20 MG) BY MOUTH TWICE DAILY 60 tablet 9  . Magnesium Oxide (MAG-OX 400 PO) Take 1 tablet by mouth daily.    . metoprolol tartrate (LOPRESSOR) 25 MG tablet Take 1 tablet (25 mg total) by mouth daily. May take 1/2 tablet extra if systolic BP is above 0000000 90 tablet 1  . omeprazole (PRILOSEC) 40 MG capsule Take 1 capsule (40 mg total) by mouth daily. 90 capsule 3  . potassium chloride (KLOR-CON) 10 MEQ tablet TAKE 1 TABLET BY MOUTH ONCE DAILY 90 tablet 2  . denosumab (PROLIA) 60 MG/ML SOLN injection Inject 60 mg into the skin every 6 (six) months. Reported on 10/16/2015    . hydrochlorothiazide (MICROZIDE) 12.5 MG capsule Take 1 capsule (12.5 mg total) by mouth daily. 90 capsule 2  . olopatadine (PATANOL) 0.1 % ophthalmic solution Place 1 drop into both eyes 2 times daily.    [name] atorvastatin (LIPITOR) 40 MG tablet Take 1 tablet (40 mg total) by mouth daily. (Patient not taking: Reported on 05/30/2019) 90 tablet 3  . furosemide (LASIX) 20 MG tablet TAKE 1 TABLET BY MOUTH ONCE DAILY (Patient not taking: Reported on 05/30/2019) 90 tablet 2  . Influenza Vac A&B Surf Ant Adj (FLUAD) 0.5 ML SUSY Fluad 2019-20 30yr up(PF)45 mcg(15 mcgx3)/0.5 mL intramuscular syringe  ADM 0.5ML IM UTD    .  nitrofurantoin, macrocrystal-monohydrate, (MACROBID) 100 MG capsule Take 1 capsule (100 mg total) by mouth 2 (two) times daily. (Patient not taking: Reported on 05/30/2019) 14 capsule 0   No facility-administered medications prior to visit.      Allergies:   Amoxicillin, Buprenorphine, Codeine, Demerol, Morphine and related, Sulfonamide derivatives, and Vicodin [hydrocodone-acetaminophen]   Social History   Socioeconomic History  . Marital status: Widowed    Spouse name: Not on file  . Number of children: 3  . Years of education: Not on file  . Highest education level: Not on file  Occupational History  . Occupation: [employer]  Social Needs  . Financial resource strain: Not on file  . Food insecurity    Worry: Not on file    Inability: Not on file  . Transportation needs    Medical: Not on file  Non-medical: Not on file  Tobacco Use  . Smoking status: Never Smoker  . Smokeless tobacco: Never Used  Substance and Sexual Activity  . Alcohol use: Yes    Comment: 1 glass of wine occasionally  . Drug use: No  . Sexual activity: Not on file  Lifestyle  . Physical activity    Days per week: Not on file    Minutes per session: Not on file  . Stress: Not on file  Relationships  . Social Herbalist on phone: Not on file    Gets together: Not on file    Attends religious service: Not on file    Active member of club or organization: Not on file    Attends meetings of clubs or organizations: Not on file    Relationship status: Not on file  Other Topics Concern  . 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  She lives alone in a 3-story home.  There is a lift chair which she does not use (placed for her husband).     Family History:  The patient's family history includes Breast cancer in her sister; Heart disease in her brother and daughter; Lymphoma in her daughter;  Stroke in her father and mother.   ROS:   Please see the history of present illness.    ROS All other systems reviewed and are negative.   PHYSICAL EXAM:   VS:  BP (!) 142/82   Pulse 71   Ht 5\' 2"  (1.575 m)   Wt 141 lb (64 kg)   SpO2 99%   BMI 25.79 kg/m      General: Alert, oriented x3, no distress, appears lean and healthy, well-healed subclavian pacemaker site Head: no evidence of trauma, PERRL, EOMI, no exophtalmos or lid lag, no myxedema, no xanthelasma; normal ears, nose and oropharynx Neck: normal jugular venous pulsations and no hepatojugular reflux; brisk carotid pulses without delay and no carotid bruits Chest: clear to auscultation, no signs of consolidation by percussion or palpation, normal fremitus, symmetrical and full respiratory excursions Cardiovascular: normal position and quality of the apical impulse, regular rhythm, normal first and second heart sounds, no murmurs, rubs or gallops Abdomen: no tenderness or distention, no masses by palpation, no abnormal pulsatility or arterial bruits, normal bowel sounds, no hepatosplenomegaly Extremities: no clubbing, cyanosis or edema; 2+ radial, ulnar and brachial pulses bilaterally; 2+ right femoral, posterior tibial and dorsalis pedis pulses; 2+ left femoral, posterior tibial and dorsalis pedis pulses; no subclavian or femoral bruits Neurological: grossly nonfocal Psych: Normal mood and affect   Wt Readings from Last 3 Encounters:  05/30/19 141 lb (64 kg)  12/13/18 136 lb 6.4 oz (61.9 kg)  11/09/18 132 lb (59.9 kg)      Studies/Labs Reviewed:   EKG:  EKG is not ordered today.  The ekg ordered October 14, 2017 demonstrates atrial paced, ventricular sensed rhythm with slightly prolonged delay of 222 ms, otherwise normal tracing.  QTc 451 ms Recent Labs: 06/13/2018: ALT 17; Hemoglobin 13.1; Platelet Count 223 07/12/2018: BUN 12; Creatinine, Ser 0.72; Potassium 4.4; Sodium 142 05/03/2019: TSH 0.31   ASSESSMENT:    1.  Chronic diastolic heart failure ([place])   2. Essential hypertension   3. SSS (sick sinus syndrome) ([place])   4. Pacemaker   5. Paroxysmal atrial fibrillation ([facility])   6. History of pericarditis      PLAN:  In order of  problems listed above:  1. CHF:  appears to be well compensated.  Avoid dietary sodium and use of NSAIDs has had less edema since we stopped the diltiazem. 2. HTN: Fair control on low-dose metoprolol, lisinopril and thiazide diuretic. 3. SSS: Heart rate histogram appears appropriate for activity level. 4. PM: Device programmed AAIR to avoid ventricular pacing.  Virtually 100% atrial paced.  Continue remote downloads every 3 months. 5. PAT/PAF: She only had 90 seconds of atrial fibrillation, October 2020, the first time since 2013.  I did not recommend anticoagulation at this time, but we discussed the fact that this may become indicated if the burden of atrial arrhythmia increases.  Notes that this arrhythmia began to show up after we stopped her diltiazem, although not sure that there is a true connection.. 6. History of post procedural acute pericarditis after pacemaker implantation, resolved. Had atrial fibrillation related to acute pericarditis, which has not recurred in years. No evidence of constrictive physiology on echo in 2018. She has not received chest radiation therapy for lymphoma, just chemotherapy.  Consider repeat echo or even cardiac MRI if signs of heart failure worsen, to look for signs of constriction.   Medication Adjustments/Labs and Tests Ordered: Current medicines are reviewed at length with the patient today.  Concerns regarding medicines are outlined above.  Medication changes, Labs and Tests ordered today are listed in the Patient Instructions below. Patient Instructions  Medication Instructions:  No changes *If you need a refill on your cardiac medications before your next appointment, please call your pharmacy*  Lab Work: None ordered If you have labs  (blood work) drawn today and your tests are completely normal, you will receive your results only by: [name] MyChart Message (if you have MyChart) OR . A paper copy in the mail If you have any lab test that is abnormal or we need to change your treatment, we will call you to review the results.  Testing/Procedures: None ordered  Follow-Up: At [facility], you and your health needs are our priority.  As part of our continuing mission to provide you with exceptional heart care, we have created designated Provider Care Teams.  These Care Teams include your primary Cardiologist (physician) and Advanced Practice Providers (APPs -  Physician Assistants and Nurse Practitioners) who all work together to provide you with the care you need, when you need it.  Your next appointment:   6 month(s)  The format for your next appointment:   In Person  Provider:   Sanda Klein, MD       Signed, Sanda Klein, MD  06/01/2019 6:52 PM    [address], [place], [place]  [address] Phone: [phone]; Fax: [phone]

## 2019-05-30 NOTE — Patient Instructions (Signed)
Medication Instructions:  No changes *If you need a refill on your cardiac medications before your next appointment, please call your pharmacy*  Lab Work: None ordered If you have labs (blood work) drawn today and your tests are completely normal, you will receive your results only by: . MyChart Message (if you have MyChart) OR . A paper copy in the mail If you have any lab test that is abnormal or we need to change your treatment, we will call you to review the results.  Testing/Procedures: None ordered  Follow-Up: At [facility], you and your health needs are our priority.  As part of our continuing mission to provide you with exceptional heart care, we have created designated Provider Care Teams.  These Care Teams include your primary Cardiologist (physician) and Advanced Practice Providers (APPs -  Physician Assistants and Nurse Practitioners) who all work together to provide you with the care you need, when you need it.  Your next appointment:   6 month(s)  The format for your next appointment:   In Person  Provider:   Mihai Croitoru, MD    

## 2019-05-31 ENCOUNTER — Other Ambulatory Visit: Payer: Self-pay

## 2019-05-31 MED ORDER — ATORVASTATIN CALCIUM 40 MG PO TABS: ORAL_TABLET | ORAL | 3 refills | Status: DC

## 2019-06-01 ENCOUNTER — Other Ambulatory Visit: Payer: Self-pay | Admitting: Cardiovascular Disease

## 2019-06-01 NOTE — Telephone Encounter (Signed)
Left a message for the patient to call back.  

## 2019-06-01 NOTE — Telephone Encounter (Addendum)
Please review for refill for Chlorthalidone 25 mg take 1/2 tablet daily.  Nehemiah Massed RPH-CPP apparently started on 03/03/2018.  The patient was seen by Dr. Sallyanne Kuster this year and the Chlorthalidone 25 mg was placed has historical provider.  I also see the patient was to have BMET and I just need a clarification if Dr. Sallyanne Kuster is the prescribing physician.

## 2019-06-04 DIAGNOSIS — D839 Common variable immunodeficiency, unspecified: Secondary | ICD-10-CM | POA: Diagnosis not present

## 2019-06-09 DIAGNOSIS — M545 Low back pain: Secondary | ICD-10-CM | POA: Diagnosis not present

## 2019-06-09 DIAGNOSIS — M4125 Other idiopathic scoliosis, thoracolumbar region: Secondary | ICD-10-CM | POA: Diagnosis not present

## 2019-06-09 DIAGNOSIS — M419 Scoliosis, unspecified: Secondary | ICD-10-CM | POA: Diagnosis not present

## 2019-06-09 DIAGNOSIS — Z79899 Other long term (current) drug therapy: Secondary | ICD-10-CM | POA: Diagnosis not present

## 2019-06-09 DIAGNOSIS — M47896 Other spondylosis, lumbar region: Secondary | ICD-10-CM | POA: Diagnosis not present

## 2019-06-09 DIAGNOSIS — M7918 Myalgia, other site: Secondary | ICD-10-CM | POA: Diagnosis not present

## 2019-06-09 DIAGNOSIS — M5136 Other intervertebral disc degeneration, lumbar region: Secondary | ICD-10-CM | POA: Diagnosis not present

## 2019-06-09 DIAGNOSIS — G894 Chronic pain syndrome: Secondary | ICD-10-CM | POA: Diagnosis not present

## 2019-06-09 DIAGNOSIS — Z79891 Long term (current) use of opiate analgesic: Secondary | ICD-10-CM | POA: Diagnosis not present

## 2019-06-11 DIAGNOSIS — D839 Common variable immunodeficiency, unspecified: Secondary | ICD-10-CM | POA: Diagnosis not present

## 2019-06-12 ENCOUNTER — Other Ambulatory Visit: Payer: Self-pay | Admitting: Orthopaedic Surgery

## 2019-06-12 DIAGNOSIS — M4125 Other idiopathic scoliosis, thoracolumbar region: Secondary | ICD-10-CM

## 2019-06-13 ENCOUNTER — Ambulatory Visit (INDEPENDENT_AMBULATORY_CARE_PROVIDER_SITE_OTHER): Payer: Medicare Other | Admitting: *Deleted

## 2019-06-13 DIAGNOSIS — Z950 Presence of cardiac pacemaker: Secondary | ICD-10-CM

## 2019-06-13 LAB — CUP PACEART REMOTE DEVICE CHECK
Battery Voltage: 2.92 V
Brady Statistic RA Percent Paced: 98.71 %
Date Time Interrogation Session: 20201215153813
Implantable Lead Implant Date: 20130319
Implantable Lead Implant Date: 20130319
Implantable Lead Location: 753859
Implantable Lead Location: 753860
Implantable Pulse Generator Implant Date: 20130319
Lead Channel Impedance Value: 368 Ohm
Lead Channel Impedance Value: 472 Ohm
Lead Channel Sensing Intrinsic Amplitude: 17.397 mV
Lead Channel Sensing Intrinsic Amplitude: 2.351 mV
Lead Channel Setting Pacing Amplitude: 2 V
Lead Channel Setting Sensing Sensitivity: 0.9 mV

## 2019-06-15 DIAGNOSIS — D839 Common variable immunodeficiency, unspecified: Secondary | ICD-10-CM | POA: Diagnosis not present

## 2019-06-16 ENCOUNTER — Ambulatory Visit (HOSPITAL_COMMUNITY)
Admission: RE | Admit: 2019-06-16 | Discharge: 2019-06-16 | Disposition: A | Discharge status: Home / Self Care | Payer: Medicare Other | Source: Ambulatory Visit | Attending: Internal Medicine | Admitting: Internal Medicine

## 2019-06-16 ENCOUNTER — Inpatient Hospital Stay: Payer: Medicare Other | Attending: Internal Medicine

## 2019-06-16 ENCOUNTER — Other Ambulatory Visit: Payer: Self-pay

## 2019-06-16 DIAGNOSIS — C8221 Follicular lymphoma grade III, unspecified, lymph nodes of head, face, and neck: Secondary | ICD-10-CM | POA: Diagnosis not present

## 2019-06-16 DIAGNOSIS — Z8572 Personal history of non-Hodgkin lymphomas: Secondary | ICD-10-CM | POA: Diagnosis not present

## 2019-06-16 DIAGNOSIS — E039 Hypothyroidism, unspecified: Secondary | ICD-10-CM | POA: Diagnosis not present

## 2019-06-16 DIAGNOSIS — C8590 Non-Hodgkin lymphoma, unspecified, unspecified site: Secondary | ICD-10-CM | POA: Diagnosis not present

## 2019-06-16 LAB — CMP (CANCER CENTER ONLY)
ALT: 20 U/L (ref 0–44)
AST: 29 U/L (ref 15–41)
Albumin: 4.3 g/dL (ref 3.5–5.0)
Alkaline Phosphatase: 96 U/L (ref 38–126)
Anion gap: 9 (ref 5–15)
BUN: 22 mg/dL (ref 8–23)
CO2: 28 mmol/L (ref 22–32)
Calcium: 9.7 mg/dL (ref 8.9–10.3)
Chloride: 102 mmol/L (ref 98–111)
Creatinine: 0.85 mg/dL (ref 0.44–1.00)
GFR, Est AFR Am: >60 mL/min (ref >60)
GFR, Estimated: >60 mL/min (ref >60)
Glucose, Bld: 83 mg/dL (ref 70–99)
Potassium: 4 mmol/L (ref 3.5–5.1)
Sodium: 139 mmol/L (ref 135–145)
Total Bilirubin: 0.6 mg/dL (ref 0.3–1.2)
Total Protein: 7.9 g/dL (ref 6.5–8.1)

## 2019-06-16 LAB — CBC WITH DIFFERENTIAL (CANCER CENTER ONLY)
Abs Immature Granulocytes: 0.06 10*3/uL (ref 0.00–0.07)
Basophils Absolute: 0.1 10*3/uL (ref 0.0–0.1)
Basophils Relative: 1 %
Eosinophils Absolute: 0.1 10*3/uL (ref 0.0–0.5)
Eosinophils Relative: 1 %
HCT: 42.6 % (ref 36.0–46.0)
Hemoglobin: 14.2 g/dL (ref 12.0–15.0)
Immature Granulocytes: 1 %
Lymphocytes Relative: 28 %
Lymphs Abs: 2.6 10*3/uL (ref 0.7–4.0)
MCH: 31.3 pg (ref 26.0–34.0)
MCHC: 33.3 g/dL (ref 30.0–36.0)
MCV: 94 fL (ref 80.0–100.0)
Monocytes Absolute: 0.6 10*3/uL (ref 0.1–1.0)
Monocytes Relative: 6 %
Neutro Abs: 6.1 10*3/uL (ref 1.7–7.7)
Neutrophils Relative %: 63 %
Platelet Count: 255 10*3/uL (ref 150–400)
RBC: 4.53 MIL/uL (ref 3.87–5.11)
RDW: 13.1 % (ref 11.5–15.5)
WBC Count: 9.5 10*3/uL (ref 4.0–10.5)
nRBC: 0 % (ref 0.0–0.2)

## 2019-06-16 LAB — LACTATE DEHYDROGENASE: LDH: 299 U/L — ABNORMAL HIGH (ref 98–192)

## 2019-06-16 MED ORDER — IOHEXOL 300 MG/ML  SOLN
100.0000 mL | Freq: Once | INTRAMUSCULAR | Status: AC | PRN
  Administered 2019-06-16: 100 mL via INTRAVENOUS

## 2019-06-16 MED ORDER — SODIUM CHLORIDE (PF) 0.9 % IJ SOLN
INTRAMUSCULAR | Status: AC
  Filled 2019-06-16: qty 50

## 2019-06-18 DIAGNOSIS — D839 Common variable immunodeficiency, unspecified: Secondary | ICD-10-CM | POA: Diagnosis not present

## 2019-06-19 ENCOUNTER — Encounter: Payer: Self-pay | Admitting: Internal Medicine

## 2019-06-19 ENCOUNTER — Inpatient Hospital Stay: Payer: Medicare Other | Admitting: Internal Medicine

## 2019-06-19 ENCOUNTER — Other Ambulatory Visit: Payer: Self-pay

## 2019-06-19 VITALS — BP 150/92 | HR 95 | Temp 98.0°F | Resp 18 | Ht 62.0 in | Wt 141.0 lb

## 2019-06-19 DIAGNOSIS — E039 Hypothyroidism, unspecified: Secondary | ICD-10-CM | POA: Diagnosis not present

## 2019-06-19 DIAGNOSIS — C8221 Follicular lymphoma grade III, unspecified, lymph nodes of head, face, and neck: Secondary | ICD-10-CM | POA: Diagnosis not present

## 2019-06-19 DIAGNOSIS — Z8572 Personal history of non-Hodgkin lymphomas: Secondary | ICD-10-CM | POA: Diagnosis not present

## 2019-06-19 NOTE — Progress Notes (Signed)
[place] Telephone:([phone]) [phone]   [phone]) [phone]  OFFICE PROGRESS NOTE  Marrian Salvage, FNP [place] [address]  DIAGNOSIS: Recurrent non-Hodgkin lymphoma, follicular center cell type with predominant follicular pattern favor high-grade (grade 3/3) diagnosed in November 2012.  PRIOR THERAPY: 1) Status post treatment with Rituxan weekly for 3 doses in addition to 3 tablets of Afinitor at [facility] in [place].  2) Status post surgical excision of the right neck mass under the care of Dr. Johney Maine on 10/28/2012.  3)  Maintenance Rituxan 375 mg/M2 every 2 months status post 12 cycles, last dose was given 10/01/2014.  CURRENT THERAPY: Observation.  INTERVAL HISTORY: [name] 84 y.o. female returns to the clinic today for follow-up visit.  The patient is feeling fine today with no concerning complaints.  She denied having any chest pain, shortness of breath, cough or hemoptysis.  She denied having any fever or chills.  She has no nausea, vomiting, diarrhea or constipation.  She has no headache or visual changes.  The patient had repeat CT scan of the chest, abdomen pelvis performed recently and she is here today for evaluation and discussion of her scan results.  MEDICAL HISTORY: Past Medical History:  Diagnosis Date  . Anemia    pt denies  . Arthritis   . Atrial fibrillation ([place])   . Cataract    bil  . Cystocele    history with rectocele, pt denies  . Dysrhythmia   . GERD (gastroesophageal reflux disease)   . H/O hiatal hernia   . Hemorrhoid   . History of pancreatitis    pt denies and denies as of today 08/09/2017  . Hypertension   . Hypothyroidism   . Non Hodgkin's lymphoma ([place])    dx 2012 and now in remission  . Osteoporosis   . Pacemaker 09/15/2011   Medtronic Revo  . Pericarditis   . Pleural effusion, bilateral    history of  . Pulmonary nodules    history of  . Scoliosis   . Varicose vein      ALLERGIES:  is allergic to amoxicillin; buprenorphine; codeine; demerol; morphine and related; sulfonamide derivatives; and vicodin [hydrocodone-acetaminophen].  MEDICATIONS:  Current Outpatient Medications  Medication Sig Dispense Refill  . acetaminophen (TYLENOL) 500 MG tablet Take 500 mg by mouth every 6 (six) hours as needed (for pain.).     [name] atorvastatin (LIPITOR) 40 MG tablet TAKE 1 TABLET(40 MG) BY MOUTH DAILY 90 tablet 3  . Calcium Carbonate-Vitamin D (CALTRATE 600+D) 600-400 MG-UNIT per tablet Take 1 tablet by mouth 2 (two) times daily.    [name] denosumab (PROLIA) 60 MG/ML SOLN injection Inject 60 mg into the skin every 6 (six) months. Reported on 10/16/2015    . gabapentin (NEURONTIN) 300 MG capsule TAKE 1 CAPSULE(300 MG) BY MOUTH THREE TIMES DAILY AS NEEDED FOR NERVE PAIN 90 capsule 5  . HIZENTRA 4 GM/20ML SOLN Inject 4 g into the skin every Wednesday.     . hydrochlorothiazide (MICROZIDE) 12.5 MG capsule Take 1 capsule (12.5 mg total) by mouth daily. 90 capsule 2  . ibuprofen (ADVIL,MOTRIN) 200 MG tablet Take 200-400 mg by mouth every 6 (six) hours as needed (for pain.).    [name] levothyroxine (SYNTHROID) 100 MCG tablet Take 1 tablet (100 mcg total) by mouth daily. 90 tablet 0  . lisinopril (ZESTRIL) 20 MG tablet TAKE 1 TABLET(20 MG) BY MOUTH TWICE DAILY 60 tablet 9  . Magnesium  Oxide (MAG-OX 400 PO) Take 1 tablet by mouth daily.    . metoprolol tartrate (LOPRESSOR) 25 MG tablet Take 1 tablet (25 mg total) by mouth daily. May take 1/2 tablet extra if systolic BP is above 0000000 90 tablet 1  . olopatadine (PATANOL) 0.1 % ophthalmic solution Place 1 drop into both eyes 2 times daily.    [name] omeprazole (PRILOSEC) 40 MG capsule Take 1 capsule (40 mg total) by mouth daily. 90 capsule 3  . potassium chloride (KLOR-CON) 10 MEQ tablet TAKE 1 TABLET BY MOUTH ONCE DAILY 90 tablet 2   No current facility-administered medications for this visit.    SURGICAL HISTORY:  Past Surgical History:   Procedure Laterality Date  . BREAST SURGERY  07/30/01   biopsy x 3, bilateral  . CATARACT EXTRACTION Bilateral   . COLONOSCOPY    . MASS EXCISION Right 10/28/2012   Procedure: Removal of mass on neck       ;  Surgeon: Adin Hector, MD;  Location: [facility];  Service: General;  Laterality: Right;  . PARTIAL HYSTERECTOMY  04/29/2004  . PERMANENT PACEMAKER INSERTION N/A 09/15/2011   Procedure: PERMANENT PACEMAKER INSERTION;  Surgeon: Sanda Klein, MD;  Location: [facility];  Service: Cardiovascular;  Laterality: N/A;  . RADIOACTIVE SEED GUIDED EXCISIONAL BREAST BIOPSY Left 08/11/2017   Procedure: LEFT RADIOACTIVE SEED GUIDED EXCISIONAL BREAST BIOPSY ERAS PATHWAY;  Surgeon: Stark Klein, MD;  Location: [facility];  Service: General;  Laterality: Left;  . TONSILLECTOMY      REVIEW OF SYSTEMS:  A comprehensive review of systems was negative.   PHYSICAL EXAMINATION: General appearance: alert, cooperative and no distress Head: Normocephalic, without obvious abnormality, atraumatic Neck: no adenopathy, no JVD, supple, symmetrical, trachea midline and thyroid not enlarged, symmetric, no tenderness/mass/nodules Lymph nodes: Cervical, supraclavicular, and axillary nodes normal. Resp: clear to auscultation bilaterally Back: symmetric, no curvature. ROM normal. No CVA tenderness. Cardio: regular rate and rhythm, S1, S2 normal, no murmur, click, rub or gallop GI: soft, non-tender; bowel sounds normal; no masses,  no organomegaly Extremities: extremities normal, atraumatic, no cyanosis or edema  ECOG PERFORMANCE STATUS: 1 - Symptomatic but completely ambulatory  Blood pressure (!) 150/92, pulse 95, temperature 98 F (36.7 C), temperature source Temporal, resp. rate 18, height 5\' 2"  (1.575 m), weight 141 lb (64 kg), SpO2 97 %.  LABORATORY DATA: Lab Results  Component Value Date   WBC 9.5 06/16/2019   HGB 14.2 06/16/2019   HCT 42.6 06/16/2019   MCV 94.0 06/16/2019   PLT 255 06/16/2019       Chemistry      Component Value Date/Time   NA 139 06/16/2019 1030   NA 143 11/26/2017 1159   NA 143 06/16/2017 0921   K 4.0 06/16/2019 1030   K 3.8 06/16/2017 0921   CL 102 06/16/2019 1030   CL 109 (H) 12/14/2012 1009   CO2 28 06/16/2019 1030   CO2 28 06/16/2017 0921   BUN 22 06/16/2019 1030   BUN 18 11/26/2017 1159   BUN 11.7 06/16/2017 0921   CREATININE 0.85 06/16/2019 1030   CREATININE 0.8 06/16/2017 0921      Component Value Date/Time   CALCIUM 9.7 06/16/2019 1030   CALCIUM 9.7 06/16/2017 0921   ALKPHOS 96 06/16/2019 1030   ALKPHOS 63 06/16/2017 0921   AST 29 06/16/2019 1030   AST 23 06/16/2017 0921   ALT 20 06/16/2019 1030   ALT 18 06/16/2017 0921   BILITOT 0.6 06/16/2019 1030  BILITOT 0.52 06/16/2017 [id number]       RADIOGRAPHIC STUDIES: CT Chest W Contrast  Result Date: 06/16/2019 CLINICAL DATA:  Followup lymphoma. EXAM: CT CHEST, ABDOMEN, AND PELVIS WITH CONTRAST TECHNIQUE: Multidetector CT imaging of the chest, abdomen and pelvis was performed following the standard protocol during bolus administration of intravenous contrast. CONTRAST:  136mL OMNIPAQUE IOHEXOL 300 MG/ML  SOLN COMPARISON:  06/13/2018 FINDINGS: CT CHEST FINDINGS Cardiovascular: Heart size appears within normal limits. Small pericardial effusion. Aortic atherosclerosis. Lad coronary artery atherosclerotic calcification. Mediastinum/Nodes: Normal appearance of the thyroid gland. The trachea appears patent and is midline. Large hiatal hernia. No mediastinal or hilar adenopathy. No axillary or supraclavicular adenopathy. Lungs/Pleura: No pleural effusion. Scattered lung nodules are identified bilaterally and appear unchanged. Index nodule in the left upper lobe measures 5 mm, image 44/4. No new or enlarging lung nodules. Musculoskeletal: Scoliosis and degenerative disc disease. No aggressive lytic or sclerotic bone lesions. CT ABDOMEN PELVIS FINDINGS Hepatobiliary: Unchanged milli metric low-density structure  within segment 7 measuring 4 mm. Anterior dome of liver cyst is unchanged measuring 1.1 cm. No suspicious liver abnormality. Gallbladder is normal. Pancreas: Unremarkable. No pancreatic ductal dilatation or surrounding inflammatory changes. Spleen: Normal in size without focal abnormality. Adrenals/Urinary Tract: Normal appearance of the adrenal glands. 4 mm low-density structure within inferior pole of left kidney is too small to characterize. No suspicious mass or hydronephrosis. Urinary bladder appears unremarkable. Stomach/Bowel: Large hiatal hernia. No bowel wall thickening, inflammation or distension identified. Appendix is visualized and appears normal. Distal colonic diverticulosis without acute inflammation. Vascular/Lymphatic: Aortic atherosclerosis. No aneurysm. No abdominopelvic adenopathy. Reproductive: Status post hysterectomy. No adnexal masses. Other: No free fluid or fluid collections. Musculoskeletal: Scoliosis and degenerative disc disease. IMPRESSION: 1. No findings to suggest residual or recurrent lymphoma. 2. Stable appearance of bilateral pulmonary nodules compatible with a benign process. 3. Hiatal hernia 4.  Aortic Atherosclerosis (ICD10-I70.0). Electronically Signed   By: Kerby Moors M.D.   On: 06/16/2019 14:29   CT Abdomen Pelvis W Contrast  Result Date: 06/16/2019 CLINICAL DATA:  Followup lymphoma. EXAM: CT CHEST, ABDOMEN, AND PELVIS WITH CONTRAST TECHNIQUE: Multidetector CT imaging of the chest, abdomen and pelvis was performed following the standard protocol during bolus administration of intravenous contrast. CONTRAST:  1106mL OMNIPAQUE IOHEXOL 300 MG/ML  SOLN COMPARISON:  06/13/2018 FINDINGS: CT CHEST FINDINGS Cardiovascular: Heart size appears within normal limits. Small pericardial effusion. Aortic atherosclerosis. Lad coronary artery atherosclerotic calcification. Mediastinum/Nodes: Normal appearance of the thyroid gland. The trachea appears patent and is midline. Large  hiatal hernia. No mediastinal or hilar adenopathy. No axillary or supraclavicular adenopathy. Lungs/Pleura: No pleural effusion. Scattered lung nodules are identified bilaterally and appear unchanged. Index nodule in the left upper lobe measures 5 mm, image 44/4. No new or enlarging lung nodules. Musculoskeletal: Scoliosis and degenerative disc disease. No aggressive lytic or sclerotic bone lesions. CT ABDOMEN PELVIS FINDINGS Hepatobiliary: Unchanged milli metric low-density structure within segment 7 measuring 4 mm. Anterior dome of liver cyst is unchanged measuring 1.1 cm. No suspicious liver abnormality. Gallbladder is normal. Pancreas: Unremarkable. No pancreatic ductal dilatation or surrounding inflammatory changes. Spleen: Normal in size without focal abnormality. Adrenals/Urinary Tract: Normal appearance of the adrenal glands. 4 mm low-density structure within inferior pole of left kidney is too small to characterize. No suspicious mass or hydronephrosis. Urinary bladder appears unremarkable. Stomach/Bowel: Large hiatal hernia. No bowel wall thickening, inflammation or distension identified. Appendix is visualized and appears normal. Distal colonic diverticulosis without acute inflammation. Vascular/Lymphatic: Aortic atherosclerosis. No aneurysm.  No abdominopelvic adenopathy. Reproductive: Status post hysterectomy. No adnexal masses. Other: No free fluid or fluid collections. Musculoskeletal: Scoliosis and degenerative disc disease. IMPRESSION: 1. No findings to suggest residual or recurrent lymphoma. 2. Stable appearance of bilateral pulmonary nodules compatible with a benign process. 3. Hiatal hernia 4.  Aortic Atherosclerosis (ICD10-I70.0). Electronically Signed   By: Kerby Moors M.D.   On: 06/16/2019 14:29   CUP PACEART REMOTE DEVICE CHECK  Result Date: 06/13/2019 Scheduled remote reviewed.  Normal device function.  Next remote 91 days- JBox, RN/CVRS   ASSESSMENT AND PLAN:  This is a very  pleasant 84 years old white female with recurrent non-Hodgkin lymphoma, follicular center cell type status post treatment with Rituxan as well as Afinitor at [facility] and also completed a course of maintenance Rituxan for 12 cycles last dose was given 10/01/2014. The patient is currently on observation and she is feeling fine today with no concerning complaints. She had repeat CT scan of the chest, abdomen pelvis performed recently.  I personally and independently reviewed the scans and discussed the results with the patient today. Her scan showed no concerning findings for disease recurrence or metastasis. I recommended for her to continue on observation with repeat blood work as well as CT scan of the chest, abdomen and pelvis in 1 year. The patient was advised to call immediately if she has any concerning symptoms in the interval. The patient voices understanding of current disease status and treatment options and is in agreement with the current care plan. All questions were answered. The patient knows to call the clinic with any problems, questions or concerns. We can certainly see the patient much sooner if necessary.   Disclaimer: This note was dictated with voice recognition software. Similar sounding words can inadvertently be transcribed and may not be corrected upon review.

## 2019-06-21 ENCOUNTER — Telehealth: Payer: Self-pay | Admitting: Internal Medicine

## 2019-06-21 DIAGNOSIS — M4125 Other idiopathic scoliosis, thoracolumbar region: Secondary | ICD-10-CM | POA: Diagnosis not present

## 2019-06-21 DIAGNOSIS — M545 Low back pain: Secondary | ICD-10-CM | POA: Diagnosis not present

## 2019-06-21 NOTE — Telephone Encounter (Signed)
Scheduled per los. Mailed printout  °

## 2019-06-25 DIAGNOSIS — D839 Common variable immunodeficiency, unspecified: Secondary | ICD-10-CM | POA: Diagnosis not present

## 2019-07-02 DIAGNOSIS — D839 Common variable immunodeficiency, unspecified: Secondary | ICD-10-CM | POA: Diagnosis not present

## 2019-07-09 DIAGNOSIS — D839 Common variable immunodeficiency, unspecified: Secondary | ICD-10-CM | POA: Diagnosis not present

## 2019-07-13 DIAGNOSIS — M4125 Other idiopathic scoliosis, thoracolumbar region: Secondary | ICD-10-CM | POA: Diagnosis not present

## 2019-07-13 DIAGNOSIS — M47816 Spondylosis without myelopathy or radiculopathy, lumbar region: Secondary | ICD-10-CM | POA: Diagnosis not present

## 2019-07-13 DIAGNOSIS — S8392XA Sprain of unspecified site of left knee, initial encounter: Secondary | ICD-10-CM | POA: Diagnosis not present

## 2019-07-15 NOTE — Progress Notes (Signed)
PPM remote 

## 2019-07-16 DIAGNOSIS — D839 Common variable immunodeficiency, unspecified: Secondary | ICD-10-CM | POA: Diagnosis not present

## 2019-07-23 DIAGNOSIS — D839 Common variable immunodeficiency, unspecified: Secondary | ICD-10-CM | POA: Diagnosis not present

## 2019-07-26 ENCOUNTER — Other Ambulatory Visit: Payer: Self-pay | Admitting: Physician Assistant

## 2019-07-28 ENCOUNTER — Other Ambulatory Visit: Payer: Self-pay

## 2019-07-28 MED ORDER — HYDROCHLOROTHIAZIDE 12.5 MG PO CAPS: 12.5000 mg | ORAL_CAPSULE | Freq: Every day | ORAL | 2 refills | Status: DC

## 2019-07-30 DIAGNOSIS — D839 Common variable immunodeficiency, unspecified: Secondary | ICD-10-CM | POA: Diagnosis not present

## 2019-08-01 ENCOUNTER — Other Ambulatory Visit: Payer: Self-pay | Admitting: Family

## 2019-08-02 ENCOUNTER — Other Ambulatory Visit (INDEPENDENT_AMBULATORY_CARE_PROVIDER_SITE_OTHER): Payer: Medicare Other

## 2019-08-02 ENCOUNTER — Other Ambulatory Visit: Payer: Self-pay | Admitting: Family

## 2019-08-02 DIAGNOSIS — E039 Hypothyroidism, unspecified: Secondary | ICD-10-CM | POA: Diagnosis not present

## 2019-08-02 DIAGNOSIS — E785 Hyperlipidemia, unspecified: Secondary | ICD-10-CM

## 2019-08-02 LAB — LIPID PANEL
Cholesterol: 205 mg/dL — ABNORMAL HIGH (ref 0–200)
HDL: 46.9 mg/dL (ref >39.00)
LDL Cholesterol: 124 mg/dL — ABNORMAL HIGH (ref 0–99)
NonHDL: 158.52
Total CHOL/HDL Ratio: 4
Triglycerides: 172 mg/dL — ABNORMAL HIGH (ref 0.0–149.0)
VLDL: 34.4 mg/dL (ref 0.0–40.0)

## 2019-08-02 NOTE — Telephone Encounter (Signed)
We asked her in early November to plan to get her TSH re-checked by early December. I need her to come get that lab checked so I can make sure she is on the correct dosage.

## 2019-08-03 LAB — TSH: TSH: 5.62 u[IU]/mL — ABNORMAL HIGH (ref 0.35–4.50)

## 2019-08-06 DIAGNOSIS — D839 Common variable immunodeficiency, unspecified: Secondary | ICD-10-CM | POA: Diagnosis not present

## 2019-08-07 ENCOUNTER — Other Ambulatory Visit: Payer: Self-pay | Admitting: Family

## 2019-08-07 MED ORDER — LEVOTHYROXINE SODIUM 112 MCG PO TABS: 112.0000 ug | ORAL_TABLET | Freq: Every day | ORAL | 0 refills | Status: DC

## 2019-08-10 ENCOUNTER — Telehealth: Payer: Self-pay

## 2019-08-10 NOTE — Telephone Encounter (Signed)
New message   The patient twisted her left knee asking will she need a referral to see an orthopedic or will she need to be seen first.   Aware NP is not in the office today.   Did not want to make appt first, looking for options.

## 2019-08-11 NOTE — Telephone Encounter (Signed)
Contact patient, appointment scheduled.

## 2019-08-11 NOTE — Telephone Encounter (Signed)
Okay to schedule her with sports medicine without seeing me.

## 2019-08-13 DIAGNOSIS — D839 Common variable immunodeficiency, unspecified: Secondary | ICD-10-CM | POA: Diagnosis not present

## 2019-08-15 ENCOUNTER — Ambulatory Visit: Payer: Self-pay

## 2019-08-15 ENCOUNTER — Ambulatory Visit: Payer: Medicare Other | Admitting: Family Medicine

## 2019-08-15 ENCOUNTER — Encounter: Payer: Self-pay | Admitting: Family Medicine

## 2019-08-15 ENCOUNTER — Ambulatory Visit (INDEPENDENT_AMBULATORY_CARE_PROVIDER_SITE_OTHER): Payer: Medicare Other

## 2019-08-15 ENCOUNTER — Other Ambulatory Visit: Payer: Self-pay

## 2019-08-15 VITALS — BP 120/70 | HR 80 | Ht 62.0 in | Wt 144.0 lb

## 2019-08-15 DIAGNOSIS — C8221 Follicular lymphoma grade III, unspecified, lymph nodes of head, face, and neck: Secondary | ICD-10-CM | POA: Diagnosis not present

## 2019-08-15 DIAGNOSIS — M25462 Effusion, left knee: Secondary | ICD-10-CM | POA: Diagnosis not present

## 2019-08-15 DIAGNOSIS — M25562 Pain in left knee: Secondary | ICD-10-CM

## 2019-08-15 DIAGNOSIS — M7122 Synovial cyst of popliteal space [Baker], left knee: Secondary | ICD-10-CM

## 2019-08-15 DIAGNOSIS — M1712 Unilateral primary osteoarthritis, left knee: Secondary | ICD-10-CM | POA: Diagnosis not present

## 2019-08-15 NOTE — Progress Notes (Signed)
Subjective:    CC: L knee pain  I, Molly Weber, LAT, ATC, am serving as scribe for Dr. Lynne Leader.  HPI: Pt is a 85 y/o female presenting w/ L knee pain x after "twisting" her L knee since Thanksgiving when she bent down to paint underneath her sink and heard a "pop" in her L knee that made her yell.  She locates her L knee pain to the L anterior knee .  She rates her L knee pain at a 5/10 currently and a 9/10 at it's worst and describes her pain as soreness.  Radiating pain: No L knee swelling: Yes L knee mechanical symptoms: No Aggravating factors: Transitioning from sit-to-stand; Walking Treatments tried: Tylenol; ice and heat  Pertinent review of Systems: No fevers or chills  Relevant historical information: History of grade 3 follicular lymphoma.  History non-Hodgkin's lymphoma.  History of osteoporosis.  History diastolic heart failure with pacer.   Objective:    Vitals:   08/15/19 1052  BP: 120/70  Pulse: 80  SpO2: 97%   General: Well Developed, well nourished, and in no acute distress.   MSK:  Left knee: Large effusion otherwise normal-appearing with no severe deformity or erythema. Tender palpation posterior medial knee. Range of motion 0-100 degrees with crepitation. Stable ligamentous exam. Some guarding with McMurray's testing nondiagnostic. Intact strength to flexion and extension.    Lab and Radiology Results  Diagnostic Limited MSK Ultrasound of: Left knee Quad tendon: Intact.  Large effusion. Patellar tendon intact normal-appearing. Lateral meniscus hypoechoic fissure through midportion of lateral meniscus indicating probable tear. Medial meniscus tear degenerative appearing partially extruded indicating chronic tear. Posterior medial knee reveals moderate Baker's cyst. Normal bony structures otherwise Impression: Large knee effusion and Baker's cyst with probable chronic lateral and medial meniscus tear.  Procedure: Real-time Ultrasound  Guided aspiration and injection of left knee lateral superior patellar space Device: Philips Affiniti 50G Images permanently stored and available for review in the ultrasound unit. Verbal informed consent obtained.  Discussed risks and benefits of procedure. Warned about infection bleeding damage to structures skin hypopigmentation and fat atrophy among others. Patient expresses understanding and agreement Time-out conducted.   Noted no overlying erythema, induration, or other signs of local infection.   Skin prepped in a sterile fashion.   Local anesthesia: Topical Ethyl chloride.   With sterile technique and under real time ultrasound guidance:  3 mL of lidocaine injected subcutaneously and into the knee joint easily.   Knee was again sterilized with rubbing alcohol. 18-gauge needle was used to access the joint space under ultrasound guidance and 40 mL of cloudy yellowish-orange fluid was aspirated.  The syringe was exchanged and 40 mg of Kenalog and 4 mL of Marcaine was injected Completed without difficulty   Pain immediately resolved suggesting accurate placement of the medication.   Advised to call if fevers/chills, erythema, induration, drainage, or persistent bleeding.   Images permanently stored and available for review in the ultrasound unit.  Impression: Technically successful ultrasound guided injection.       X-ray images left knee obtained today personally independently reviewed Severe lateral compartment DJD.  No acute fractures. Await formal radiology review    Impression and Recommendations:    Assessment and Plan: 85 y.o. female with left knee pain and effusion following twisting injury occurring in late November.  She has continued dysfunction.  X-ray today shows DJD and ultrasound shows effusion and probable chronic meniscus tears.  Plan for aspiration injection as above.  Also recommend Voltaren gel.  Prescription NSAID is also an option of Voltaren gel is not  sufficient. We will send fluid aspirate to lab for analysis given her relative immunocompromise status and her chronic smoldering lymphoma. Recheck back in 1 month.  Return sooner if needed.   Orders Placed This Encounter  Procedures  . Anaerobic and Aerobic Culture    Source Left knee fluid    Standing Status:   Future    Standing Expiration Date:   08/14/2020  . [place] LIMITED JOINT SPACE STRUCTURES LOW LEFT(NO LINKED CHARGES)    Order Specific Question:   Reason for Exam (SYMPTOM  OR DIAGNOSIS REQUIRED)    Answer:   L knee pain    Order Specific Question:   Preferred imaging location?    Answer:   [facility]  . DG Knee 4 Views W/Patella Left    Standing Status:   Future    Number of Occurrences:   1    Standing Expiration Date:   10/12/2020    Order Specific Question:   Reason for Exam (SYMPTOM  OR DIAGNOSIS REQUIRED)    Answer:   eval left knee pain    Order Specific Question:   Preferred imaging location?    Answer:   [facility]    Order Specific Question:   Radiology Contrast Protocol - do NOT remove file path    Answer:   \\[email or web address]  . Synovial cell count + diff, w/ crystals    Left knee fluid    Standing Status:   Future    Standing Expiration Date:   08/14/2020   No orders of the defined types were placed in this encounter.   Discussed warning signs or symptoms. Please see discharge instructions. Patient expresses understanding.   The above documentation has been reviewed and is accurate and complete Lynne Leader

## 2019-08-15 NOTE — Addendum Note (Signed)
Addended by: Trenda Moots on: [id number] 11:43 AM   Modules accepted: Orders

## 2019-08-15 NOTE — Patient Instructions (Addendum)
Thank you for coming in today.  Call or go to the ER if you develop a large red swollen joint with extreme pain or oozing puss.   I will send the fluid to the lab to evaluate.  Try over the counter voltaren gel and prescription pennsaid.  Let me know if you do not do well.   Plan to recheck in about 1 month.   COVID-19 Vaccine Information can be found at: ShippingScam.co.uk For questions related to vaccine distribution or appointments, please email [email or web address] or call [phone].

## 2019-08-16 NOTE — Progress Notes (Signed)
Knee fluid looks pretty normal. No bacterial growth at this point and the cells in the fluid are typical for knee arthritis.

## 2019-08-16 NOTE — Progress Notes (Signed)
Arthritis present in the knee.  No fractures.

## 2019-08-18 NOTE — Progress Notes (Signed)
Culture remains negative.  No growth so far.  This is good news.

## 2019-08-20 DIAGNOSIS — D839 Common variable immunodeficiency, unspecified: Secondary | ICD-10-CM | POA: Diagnosis not present

## 2019-08-21 LAB — ANAEROBIC AND AEROBIC CULTURE
AER RESULT:: NO GROWTH
GRAM STAIN:: NONE SEEN
MICRO NUMBER:: 10155892
MICRO NUMBER:: 10155893
SPECIMEN QUALITY:: ADEQUATE
SPECIMEN QUALITY:: ADEQUATE

## 2019-08-21 LAB — SYNOVIAL CELL COUNT + DIFF, W/ CRYSTALS
Basophils, %: 0 %
Eosinophils-Synovial: 0 % (ref 0–2)
Lymphocytes-Synovial Fld: 86 % — ABNORMAL HIGH (ref 0–74)
Monocyte/Macrophage: 6 % (ref 0–69)
Neutrophil, Synovial: 7 % (ref 0–24)
Synoviocytes, %: 1 % (ref 0–15)
WBC, Synovial: 665 cells/uL — ABNORMAL HIGH (ref <150)

## 2019-08-21 NOTE — Progress Notes (Signed)
Knee fluid culture came back negative on final.  No infection present.

## 2019-08-25 ENCOUNTER — Ambulatory Visit: Payer: Medicare Other | Attending: Internal Medicine

## 2019-08-25 DIAGNOSIS — Z23 Encounter for immunization: Secondary | ICD-10-CM

## 2019-08-25 NOTE — Progress Notes (Signed)
   Covid-19 Vaccination Clinic  Name:  KATHILEEN BERNINGER    MRN: [id number] DOB: 06-25-1935  08/25/2019  Ms. Jakus was observed post Covid-19 immunization for 15 minutes without incidence. She was provided with Vaccine Information Sheet and instruction to access the V-Safe system.   Ms. Starns was instructed to call 911 with any severe reactions post vaccine: [name] Difficulty breathing  . Swelling of your face and throat  . A fast heartbeat  . A bad rash all over your body  . Dizziness and weakness    Immunizations Administered    Name Date Dose VIS Date Route   Pfizer COVID-19 Vaccine 08/25/2019 12:51 PM 0.3 mL 06/09/2019 Intramuscular   Manufacturer: [place]   Lot: [id number]   [place]: [id number]

## 2019-08-27 ENCOUNTER — Other Ambulatory Visit: Payer: Self-pay | Admitting: Physician Assistant

## 2019-08-27 DIAGNOSIS — D839 Common variable immunodeficiency, unspecified: Secondary | ICD-10-CM | POA: Diagnosis not present

## 2019-08-28 ENCOUNTER — Other Ambulatory Visit: Payer: Self-pay | Admitting: *Deleted

## 2019-08-28 MED ORDER — LISINOPRIL 20 MG PO TABS: 20.0000 mg | ORAL_TABLET | Freq: Two times a day (BID) | ORAL | 4 refills | Status: DC

## 2019-09-03 DIAGNOSIS — D839 Common variable immunodeficiency, unspecified: Secondary | ICD-10-CM | POA: Diagnosis not present

## 2019-09-07 ENCOUNTER — Ambulatory Visit: Payer: Medicare Other | Admitting: Family Medicine

## 2019-09-07 ENCOUNTER — Encounter: Payer: Self-pay | Admitting: Family Medicine

## 2019-09-07 ENCOUNTER — Other Ambulatory Visit: Payer: Self-pay

## 2019-09-07 VITALS — BP 138/86 | HR 67 | Ht 62.0 in | Wt 144.8 lb

## 2019-09-07 DIAGNOSIS — M25462 Effusion, left knee: Secondary | ICD-10-CM

## 2019-09-07 DIAGNOSIS — M25562 Pain in left knee: Secondary | ICD-10-CM | POA: Diagnosis not present

## 2019-09-07 DIAGNOSIS — M7122 Synovial cyst of popliteal space [Baker], left knee: Secondary | ICD-10-CM

## 2019-09-07 NOTE — Patient Instructions (Signed)
Thank you for coming in today. We will work to prior authorize the gel shots.  You should hear something next week.  Let me know if you do not hear anything.   Sodium Hyaluronate intra-articular injection What is this medicine? SODIUM HYALURONATE (SOE dee um hye al yoor ON ate) is used to treat pain in the knee due to osteoarthritis. This medicine may be used for other purposes; ask your health care provider or pharmacist if you have questions. COMMON BRAND NAME(S): Amvisc, DUROLANE, Euflexxa, GELSYN-3, Hyalgan, Hymovis, Monovisc, Orthovisc, Supartz, Supartz FX, TriVisc, VISCO What should I tell my health care provider before I take this medicine? They need to know if you have any of these conditions:  bleeding disorders  glaucoma  infection in the knee joint  skin conditions or sensitivity  skin infection  an unusual allergic reaction to sodium hyaluronate, other medicines, foods, dyes, or preservatives. Different brands of sodium hyaluronate contain different allergens. Some may contain egg. Talk to your doctor about your allergies to make sure that you get the right product.  pregnant or trying to get pregnant  breast-feeding How should I use this medicine? This medicine is for injection into the knee joint. It is given by a health care professional in a hospital or clinic setting. Talk to your pediatrician regarding the use of this medicine in children. Special care may be needed. Overdosage: If you think you have taken too much of this medicine contact a poison control center or emergency room at once. NOTE: This medicine is only for you. Do not share this medicine with others. What if I miss a dose? This does not apply. What may interact with this medicine? Interactions are not expected. This list may not describe all possible interactions. Give your health care provider a list of all the medicines, herbs, non-prescription drugs, or dietary supplements you use. Also tell  them if you smoke, drink alcohol, or use illegal drugs. Some items may interact with your medicine. What should I watch for while using this medicine? Tell your doctor or healthcare professional if your symptoms do not start to get better or if they get worse. If receiving this medicine for osteoarthritis, limit your activity after you receive your injection. Avoid physical activity for 48 hours following your injection to keep your knee from swelling. Do not stand on your feet for more than 1 hour at a time during the first 48 hours following your injection. Ask your doctor or healthcare professional about when you can begin major physical activity again. What side effects may I notice from receiving this medicine? Side effects that you should report to your doctor or health care professional as soon as possible:  allergic reactions like skin rash, itching or hives, swelling of the face, lips, or tongue  dizziness  facial flushing  pain, tingling, numbness in the hands or feet  vision changes if received this medicine during eye surgery Side effects that usually do not require medical attention (report to your doctor or health care professional if they continue or are bothersome):  back pain  bruising at site where injected  chills  diarrhea  fever  headache  joint pain  joint stiffness  joint swelling  muscle cramps  muscle pain  nausea, vomiting  pain, redness, or irritation at site where injected  weak or tired This list may not describe all possible side effects. Call your doctor for medical advice about side effects. You may report side effects to FDA at  1-800-FDA-1088. Where should I keep my medicine? This drug is given in a hospital or clinic and will not be stored at home. NOTE: This sheet is a summary. It may not cover all possible information. If you have questions about this medicine, talk to your doctor, pharmacist, or health care provider.  2020  Elsevier/Gold Standard (2015-07-18 08:34:51)

## 2019-09-07 NOTE — Progress Notes (Signed)
   I, Wendy Poet, LAT, ATC, am serving as scribe for Dr. Lynne Leader.  [name] is a 85 y.o. female who presents to [facility] today for f/u of L knee pain that started around Thanksgiving 2020.  She was last seen by Dr. Georgina Snell on 08/15/19 and had a L knee injection.  Since her last visit, pt reports that her L knee improved after the aspiration and injection until last weekend when her L knee swelled again and started having more pain.  She states that her pain is not as bad as it was at her last visit but reports pain w/ ambulation and w/ attempts at L knee flexion.  Diagnostic testing: L knee XR- 08/15/19   Pertinent review of systems: No fevers or chills  Relevant historical information: History of atrial fibrillation vasovagal syncope sinus node dysfunction.   Exam:  BP 138/86 (BP Location: Left Arm, Patient Position: Sitting, Cuff Size: Normal)   Pulse 67   Ht 5\' 2"  (1.575 m)   Wt 144 lb 12.8 oz (65.7 kg)   SpO2 97%   BMI 26.48 kg/m  General: Well Developed, well nourished, and in no acute distress.   MSK: Left knee large effusion mildly tender.  Normal knee motion.      Assessment and Plan: 85 y.o. female with left knee effusion and pain.  Patient has considerable lateral compartment DJD seen on prior x-ray.  She also likely has a meniscus tear contributing to her pain and effusion.  She had trial of steroid injection about a month ago which worked until recently.  She would like to avoid repeat steroid ejections if possible.  After discussion of possible options we will work on authorizing hyaluronic acid injections.  This often requires approval from insurance prior to administration so we will not do this procedure today.  We will work on authorization and patient return to clinic.  We will aspirate the knee effusion then proceed with hyaluronic acid injection series.  Discussed that given her DJD if she cannot get sufficient control  of her symptoms with conservative management arthroscopic surgery is probably not going to help much and total knee replacement is probably the best surgical option.   Discussed warning signs or symptoms. Please see discharge instructions. Patient expresses understanding.   The above documentation has been reviewed and is accurate and complete Lynne Leader

## 2019-09-10 DIAGNOSIS — D839 Common variable immunodeficiency, unspecified: Secondary | ICD-10-CM | POA: Diagnosis not present

## 2019-09-12 ENCOUNTER — Ambulatory Visit: Payer: Medicare Other | Admitting: Family Medicine

## 2019-09-13 ENCOUNTER — Ambulatory Visit: Payer: Self-pay

## 2019-09-13 ENCOUNTER — Ambulatory Visit (INDEPENDENT_AMBULATORY_CARE_PROVIDER_SITE_OTHER): Payer: Medicare Other | Admitting: Family Medicine

## 2019-09-13 ENCOUNTER — Other Ambulatory Visit: Payer: Self-pay

## 2019-09-13 DIAGNOSIS — M1712 Unilateral primary osteoarthritis, left knee: Secondary | ICD-10-CM

## 2019-09-13 DIAGNOSIS — M25462 Effusion, left knee: Secondary | ICD-10-CM

## 2019-09-13 NOTE — Progress Notes (Signed)
Patient presents to clinic today for first in a series of 3 Gelsyn injections in her left knee  Procedure: Real-time Ultrasound Guided Injection of left knee Device: Philips Affiniti 50G Images permanently stored and available for review in the ultrasound unit. Verbal informed consent obtained.  Discussed risks and benefits of procedure. Warned about infection bleeding damage to structures skin hypopigmentation and fat atrophy among others. Patient expresses understanding and agreement Time-out conducted.   Noted no overlying erythema, induration, or other signs of local infection.   Skin prepped in a sterile fashion.   Local anesthesia: Topical Ethyl chloride.   With sterile technique and under real time ultrasound guidance:  Gelsyn injected easily.   Completed without difficulty      Advised to call if fevers/chills, erythema, induration, drainage, or persistent bleeding.   Images permanently stored and available for review in the ultrasound unit.  Impression: Technically successful ultrasound guided injection.   Lot number [id number]  Return in 1 week for injection left knee 2/3

## 2019-09-13 NOTE — Patient Instructions (Addendum)
You had a L knee Gelsyn injection today.  Call or go to the ER if you develop a large red swollen joint with extreme pain or oozing puss.   Return in 1 week for injection #2/3

## 2019-09-17 DIAGNOSIS — D839 Common variable immunodeficiency, unspecified: Secondary | ICD-10-CM | POA: Diagnosis not present

## 2019-09-20 ENCOUNTER — Ambulatory Visit: Payer: Medicare Other | Attending: Internal Medicine

## 2019-09-20 DIAGNOSIS — Z23 Encounter for immunization: Secondary | ICD-10-CM

## 2019-09-20 NOTE — Progress Notes (Signed)
   Covid-19 Vaccination Clinic  Name:  [name]    MRN: [id number] DOB: September 29, 1934  09/20/2019  [name] was observed post Covid-19 immunization for 15 minutes without incident. She was provided with Vaccine Information Sheet and instruction to access the V-Safe system.   [name] was instructed to call 911 with any severe reactions post vaccine: [name] Difficulty breathing  . Swelling of face and throat  . A fast heartbeat  . A bad rash all over body  . Dizziness and weakness   Immunizations Administered    Name Date Dose VIS Date Route   Pfizer COVID-19 Vaccine 09/20/2019  9:06 AM 0.3 mL 06/09/2019 Intramuscular   Manufacturer: [place]   Lot: [id number]   [place]: [id number]

## 2019-09-22 ENCOUNTER — Ambulatory Visit: Payer: Self-pay

## 2019-09-22 ENCOUNTER — Telehealth: Payer: Self-pay | Admitting: Cardiovascular Disease

## 2019-09-22 ENCOUNTER — Ambulatory Visit: Payer: Medicare Other | Admitting: Family Medicine

## 2019-09-22 DIAGNOSIS — M1712 Unilateral primary osteoarthritis, left knee: Secondary | ICD-10-CM | POA: Diagnosis not present

## 2019-09-22 DIAGNOSIS — Z0289 Encounter for other administrative examinations: Secondary | ICD-10-CM

## 2019-09-22 NOTE — Progress Notes (Signed)
Macall KATRIONA STOUGH was scheduled today for her second Gelsyn injection.  She notes that she has not had any improvement at all following her first injection and does not want to pursue further injections.  She notes that she did not have much knee pain at all prior to the pain becoming much worse about 2 months ago when her knee popped.  I discussed that I think it is possible that she has a meniscus tear in her knee and normally someone in her situation would not be a good candidate for meniscus debridement but she may be a good candidate because she does not have much knee pain prior to the injury and is having mechanical symptoms.  The only wrinkle here is that she has a pacemaker.  She notes that she has had MRIs since that pacemaker was implanted and thinks that it is MRI compatible.  She does have the pacemaker card at home but not with her at this time.  She will call back to give me the details of her pacemaker.  She notes that she like to give it a bit more time to get better prior to proceeding with MRI.  She will call and let me know in the future if we need to proceed with further treatment.

## 2019-09-22 NOTE — Telephone Encounter (Signed)
Patient states she is inquiring about whether or not she is eligible to have an MRI with her pacemaker implanted. Please advise.

## 2019-09-22 NOTE — Telephone Encounter (Signed)
Contacted patient, she is to have an MRI of her knee- but her doctor will not do it because she has a pacemaker, they need to know if it is okay to proceed with MRI. She would like to know from Dr.C if this is okay.  I advised he was out of office today- but when he returned we would give her a call back. Patient verbalized understanding and was thankful for call back.

## 2019-09-22 NOTE — Telephone Encounter (Signed)
Her pacemaker is MRI-conditional. She can have a knee MRI. She is not pacemaker dependent, but does have virtually 100% atrial pacing.  Please have her schedule the knee MRI and let us know the date, time and location so that we can ask [employer] rep to be there to perform the necessary device reprogramming.

## 2019-09-24 DIAGNOSIS — D839 Common variable immunodeficiency, unspecified: Secondary | ICD-10-CM | POA: Diagnosis not present

## 2019-09-25 NOTE — Telephone Encounter (Signed)
The patient has verbalized her understanding and will call back when it is scheduled.

## 2019-09-26 ENCOUNTER — Other Ambulatory Visit: Payer: Self-pay | Admitting: Cardiovascular Disease

## 2019-10-01 DIAGNOSIS — D839 Common variable immunodeficiency, unspecified: Secondary | ICD-10-CM | POA: Diagnosis not present

## 2019-10-08 DIAGNOSIS — D839 Common variable immunodeficiency, unspecified: Secondary | ICD-10-CM | POA: Diagnosis not present

## 2019-10-15 DIAGNOSIS — D839 Common variable immunodeficiency, unspecified: Secondary | ICD-10-CM | POA: Diagnosis not present

## 2019-10-19 ENCOUNTER — Other Ambulatory Visit: Payer: Self-pay | Admitting: Family

## 2019-10-22 DIAGNOSIS — D839 Common variable immunodeficiency, unspecified: Secondary | ICD-10-CM | POA: Diagnosis not present

## 2019-10-29 DIAGNOSIS — D839 Common variable immunodeficiency, unspecified: Secondary | ICD-10-CM | POA: Diagnosis not present

## 2019-11-05 DIAGNOSIS — D839 Common variable immunodeficiency, unspecified: Secondary | ICD-10-CM | POA: Diagnosis not present

## 2019-11-09 DIAGNOSIS — M4125 Other idiopathic scoliosis, thoracolumbar region: Secondary | ICD-10-CM | POA: Diagnosis not present

## 2019-11-09 DIAGNOSIS — M47816 Spondylosis without myelopathy or radiculopathy, lumbar region: Secondary | ICD-10-CM | POA: Diagnosis not present

## 2019-11-09 DIAGNOSIS — S8392XA Sprain of unspecified site of left knee, initial encounter: Secondary | ICD-10-CM | POA: Diagnosis not present

## 2019-11-10 DIAGNOSIS — M1712 Unilateral primary osteoarthritis, left knee: Secondary | ICD-10-CM | POA: Diagnosis not present

## 2019-11-12 DIAGNOSIS — D839 Common variable immunodeficiency, unspecified: Secondary | ICD-10-CM | POA: Diagnosis not present

## 2019-11-13 ENCOUNTER — Telehealth: Payer: Self-pay | Admitting: Family

## 2019-11-13 NOTE — Telephone Encounter (Signed)
New message:    Pt is calling to inquire about when she should get her Prolia injection. Please advise.

## 2019-11-15 ENCOUNTER — Ambulatory Visit (INDEPENDENT_AMBULATORY_CARE_PROVIDER_SITE_OTHER): Payer: Medicare Other | Admitting: *Deleted

## 2019-11-15 DIAGNOSIS — I495 Sick sinus syndrome: Secondary | ICD-10-CM | POA: Diagnosis not present

## 2019-11-15 LAB — CUP PACEART REMOTE DEVICE CHECK
Battery Voltage: 2.9 V
Brady Statistic RA Percent Paced: 97.38 %
Date Time Interrogation Session: 20210519120025
Implantable Lead Implant Date: 20130319
Implantable Lead Implant Date: 20130319
Implantable Lead Location: 753859
Implantable Lead Location: 753860
Implantable Pulse Generator Implant Date: 20130319
Lead Channel Impedance Value: 384 Ohm
Lead Channel Impedance Value: 464 Ohm
Lead Channel Sensing Intrinsic Amplitude: 1.818 mV
Lead Channel Sensing Intrinsic Amplitude: 15.35 mV
Lead Channel Setting Pacing Amplitude: 2 V
Lead Channel Setting Sensing Sensitivity: 0.9 mV

## 2019-11-15 NOTE — Telephone Encounter (Signed)
I am re-verifying her insurance to check benefits. Once I have that back, I will give her a call to schedule.

## 2019-11-16 NOTE — Progress Notes (Signed)
Remote pacemaker transmission.   

## 2019-11-17 DIAGNOSIS — M1712 Unilateral primary osteoarthritis, left knee: Secondary | ICD-10-CM | POA: Diagnosis not present

## 2019-11-19 DIAGNOSIS — D839 Common variable immunodeficiency, unspecified: Secondary | ICD-10-CM | POA: Diagnosis not present

## 2019-11-20 ENCOUNTER — Telehealth: Payer: Self-pay

## 2019-11-20 NOTE — Telephone Encounter (Signed)
Pt daughter called to let us know she is trying to get monitor working as the patient forgot username and password to the application. Daughter will keep [place] posted

## 2019-11-26 DIAGNOSIS — D839 Common variable immunodeficiency, unspecified: Secondary | ICD-10-CM | POA: Diagnosis not present

## 2019-11-28 DIAGNOSIS — M4125 Other idiopathic scoliosis, thoracolumbar region: Secondary | ICD-10-CM | POA: Diagnosis not present

## 2019-11-28 DIAGNOSIS — Z4689 Encounter for fitting and adjustment of other specified devices: Secondary | ICD-10-CM | POA: Diagnosis not present

## 2019-11-29 DIAGNOSIS — Z779 Other contact with and (suspected) exposures hazardous to health: Secondary | ICD-10-CM | POA: Diagnosis not present

## 2019-11-29 DIAGNOSIS — Z1231 Encounter for screening mammogram for malignant neoplasm of breast: Secondary | ICD-10-CM | POA: Diagnosis not present

## 2019-12-03 DIAGNOSIS — D839 Common variable immunodeficiency, unspecified: Secondary | ICD-10-CM | POA: Diagnosis not present

## 2019-12-10 DIAGNOSIS — D839 Common variable immunodeficiency, unspecified: Secondary | ICD-10-CM | POA: Diagnosis not present

## 2019-12-17 DIAGNOSIS — D839 Common variable immunodeficiency, unspecified: Secondary | ICD-10-CM | POA: Diagnosis not present

## 2019-12-21 DIAGNOSIS — M4125 Other idiopathic scoliosis, thoracolumbar region: Secondary | ICD-10-CM | POA: Diagnosis not present

## 2019-12-21 DIAGNOSIS — M545 Low back pain: Secondary | ICD-10-CM | POA: Diagnosis not present

## 2019-12-21 DIAGNOSIS — M47816 Spondylosis without myelopathy or radiculopathy, lumbar region: Secondary | ICD-10-CM | POA: Diagnosis not present

## 2019-12-21 DIAGNOSIS — G894 Chronic pain syndrome: Secondary | ICD-10-CM | POA: Diagnosis not present

## 2019-12-24 DIAGNOSIS — D839 Common variable immunodeficiency, unspecified: Secondary | ICD-10-CM | POA: Diagnosis not present

## 2019-12-25 ENCOUNTER — Other Ambulatory Visit: Payer: Self-pay | Admitting: Cardiovascular Disease

## 2019-12-31 DIAGNOSIS — D839 Common variable immunodeficiency, unspecified: Secondary | ICD-10-CM | POA: Diagnosis not present

## 2020-01-04 DIAGNOSIS — N958 Other specified menopausal and perimenopausal disorders: Secondary | ICD-10-CM | POA: Diagnosis not present

## 2020-01-04 DIAGNOSIS — M8588 Other specified disorders of bone density and structure, other site: Secondary | ICD-10-CM | POA: Diagnosis not present

## 2020-01-07 DIAGNOSIS — D839 Common variable immunodeficiency, unspecified: Secondary | ICD-10-CM | POA: Diagnosis not present

## 2020-01-08 ENCOUNTER — Other Ambulatory Visit: Payer: Self-pay | Admitting: Physician Assistant

## 2020-01-12 ENCOUNTER — Other Ambulatory Visit: Payer: Self-pay | Admitting: Family

## 2020-01-12 DIAGNOSIS — E039 Hypothyroidism, unspecified: Secondary | ICD-10-CM

## 2020-01-14 DIAGNOSIS — D839 Common variable immunodeficiency, unspecified: Secondary | ICD-10-CM | POA: Diagnosis not present

## 2020-01-15 ENCOUNTER — Other Ambulatory Visit: Payer: Self-pay | Admitting: Family

## 2020-01-15 ENCOUNTER — Other Ambulatory Visit (INDEPENDENT_AMBULATORY_CARE_PROVIDER_SITE_OTHER): Payer: Medicare Other

## 2020-01-15 DIAGNOSIS — E039 Hypothyroidism, unspecified: Secondary | ICD-10-CM

## 2020-01-15 LAB — TSH: TSH: 0.16 u[IU]/mL — ABNORMAL LOW (ref 0.35–4.50)

## 2020-01-15 MED ORDER — LEVOTHYROXINE SODIUM 100 MCG PO TABS: 100.0000 ug | ORAL_TABLET | Freq: Every day | ORAL | 3 refills | Status: DC

## 2020-01-18 ENCOUNTER — Ambulatory Visit (INDEPENDENT_AMBULATORY_CARE_PROVIDER_SITE_OTHER): Payer: Medicare Other | Admitting: *Deleted

## 2020-01-18 ENCOUNTER — Other Ambulatory Visit: Payer: Self-pay

## 2020-01-18 DIAGNOSIS — Z23 Encounter for immunization: Secondary | ICD-10-CM

## 2020-01-18 DIAGNOSIS — M810 Age-related osteoporosis without current pathological fracture: Secondary | ICD-10-CM | POA: Diagnosis not present

## 2020-01-18 MED ORDER — DENOSUMAB 60 MG/ML ~~LOC~~ SOSY
60.0000 mg | PREFILLED_SYRINGE | Freq: Once | SUBCUTANEOUS | Status: AC
  Administered 2020-01-18: 60 mg via SUBCUTANEOUS

## 2020-01-18 NOTE — Progress Notes (Signed)
Pls cosign for Prolia inj in absence of PCP.../lmb  

## 2020-01-21 DIAGNOSIS — D839 Common variable immunodeficiency, unspecified: Secondary | ICD-10-CM | POA: Diagnosis not present

## 2020-01-24 ENCOUNTER — Other Ambulatory Visit: Payer: Self-pay | Admitting: Family

## 2020-01-25 ENCOUNTER — Telehealth: Payer: Self-pay | Admitting: Cardiovascular Disease

## 2020-01-25 NOTE — Telephone Encounter (Signed)
Unable to leave message for patient, line disconnected

## 2020-01-28 DIAGNOSIS — D839 Common variable immunodeficiency, unspecified: Secondary | ICD-10-CM | POA: Diagnosis not present

## 2020-02-04 DIAGNOSIS — D839 Common variable immunodeficiency, unspecified: Secondary | ICD-10-CM | POA: Diagnosis not present

## 2020-02-11 DIAGNOSIS — D839 Common variable immunodeficiency, unspecified: Secondary | ICD-10-CM | POA: Diagnosis not present

## 2020-02-14 ENCOUNTER — Ambulatory Visit (INDEPENDENT_AMBULATORY_CARE_PROVIDER_SITE_OTHER): Payer: Medicare Other | Admitting: *Deleted

## 2020-02-14 DIAGNOSIS — R001 Bradycardia, unspecified: Secondary | ICD-10-CM | POA: Diagnosis not present

## 2020-02-16 LAB — CUP PACEART REMOTE DEVICE CHECK
Battery Voltage: 2.89 V
Brady Statistic RA Percent Paced: 97.56 %
Date Time Interrogation Session: 20210819164359
Implantable Lead Implant Date: 20130319
Implantable Lead Implant Date: 20130319
Implantable Lead Location: 753859
Implantable Lead Location: 753860
Implantable Pulse Generator Implant Date: 20130319
Lead Channel Impedance Value: 392 Ohm
Lead Channel Impedance Value: 464 Ohm
Lead Channel Sensing Intrinsic Amplitude: 1.641 mV
Lead Channel Sensing Intrinsic Amplitude: 14.327 mV
Lead Channel Setting Pacing Amplitude: 2 V
Lead Channel Setting Sensing Sensitivity: 0.9 mV

## 2020-02-16 NOTE — Progress Notes (Signed)
Remote pacemaker transmission.   

## 2020-02-18 DIAGNOSIS — D839 Common variable immunodeficiency, unspecified: Secondary | ICD-10-CM | POA: Diagnosis not present

## 2020-02-25 ENCOUNTER — Other Ambulatory Visit: Payer: Self-pay | Admitting: Family

## 2020-03-03 DIAGNOSIS — D839 Common variable immunodeficiency, unspecified: Secondary | ICD-10-CM | POA: Diagnosis not present

## 2020-03-05 ENCOUNTER — Other Ambulatory Visit (INDEPENDENT_AMBULATORY_CARE_PROVIDER_SITE_OTHER): Payer: Medicare Other

## 2020-03-05 ENCOUNTER — Other Ambulatory Visit: Payer: Self-pay | Admitting: Family

## 2020-03-05 DIAGNOSIS — E039 Hypothyroidism, unspecified: Secondary | ICD-10-CM | POA: Diagnosis not present

## 2020-03-05 NOTE — Addendum Note (Signed)
Addended by: Eddie North C on: 03/05/2020 05:00 PM   Modules accepted: Orders

## 2020-03-06 LAB — TSH: TSH: 0.71 u[IU]/mL (ref 0.35–4.50)

## 2020-03-10 DIAGNOSIS — D839 Common variable immunodeficiency, unspecified: Secondary | ICD-10-CM | POA: Diagnosis not present

## 2020-03-17 DIAGNOSIS — D839 Common variable immunodeficiency, unspecified: Secondary | ICD-10-CM | POA: Diagnosis not present

## 2020-03-24 ENCOUNTER — Other Ambulatory Visit: Payer: Self-pay | Admitting: Cardiovascular Disease

## 2020-03-24 DIAGNOSIS — D839 Common variable immunodeficiency, unspecified: Secondary | ICD-10-CM | POA: Diagnosis not present

## 2020-03-31 DIAGNOSIS — D839 Common variable immunodeficiency, unspecified: Secondary | ICD-10-CM | POA: Diagnosis not present

## 2020-04-07 DIAGNOSIS — D839 Common variable immunodeficiency, unspecified: Secondary | ICD-10-CM | POA: Diagnosis not present

## 2020-04-14 DIAGNOSIS — D839 Common variable immunodeficiency, unspecified: Secondary | ICD-10-CM | POA: Diagnosis not present

## 2020-04-21 DIAGNOSIS — D839 Common variable immunodeficiency, unspecified: Secondary | ICD-10-CM | POA: Diagnosis not present

## 2020-04-23 ENCOUNTER — Other Ambulatory Visit: Payer: Self-pay | Admitting: Cardiovascular Disease

## 2020-04-28 DIAGNOSIS — D839 Common variable immunodeficiency, unspecified: Secondary | ICD-10-CM | POA: Diagnosis not present

## 2020-05-05 DIAGNOSIS — D839 Common variable immunodeficiency, unspecified: Secondary | ICD-10-CM | POA: Diagnosis not present

## 2020-05-08 ENCOUNTER — Other Ambulatory Visit: Payer: Self-pay

## 2020-05-08 ENCOUNTER — Ambulatory Visit (INDEPENDENT_AMBULATORY_CARE_PROVIDER_SITE_OTHER): Payer: Medicare Other | Admitting: Family

## 2020-05-08 VITALS — BP 130/82 | HR 68 | Temp 97.8°F | Ht 62.0 in | Wt 143.0 lb

## 2020-05-08 DIAGNOSIS — E039 Hypothyroidism, unspecified: Secondary | ICD-10-CM | POA: Diagnosis not present

## 2020-05-08 DIAGNOSIS — Z23 Encounter for immunization: Secondary | ICD-10-CM

## 2020-05-08 DIAGNOSIS — E785 Hyperlipidemia, unspecified: Secondary | ICD-10-CM | POA: Diagnosis not present

## 2020-05-08 DIAGNOSIS — M79671 Pain in right foot: Secondary | ICD-10-CM | POA: Diagnosis not present

## 2020-05-08 DIAGNOSIS — E538 Deficiency of other specified B group vitamins: Secondary | ICD-10-CM | POA: Diagnosis not present

## 2020-05-08 DIAGNOSIS — M79672 Pain in left foot: Secondary | ICD-10-CM

## 2020-05-08 DIAGNOSIS — G6289 Other specified polyneuropathies: Secondary | ICD-10-CM

## 2020-05-08 DIAGNOSIS — R209 Unspecified disturbances of skin sensation: Secondary | ICD-10-CM | POA: Diagnosis not present

## 2020-05-08 LAB — LIPID PANEL
Cholesterol: 168 mg/dL (ref 0–200)
HDL: 42.5 mg/dL (ref >39.00)
LDL Cholesterol: 90 mg/dL (ref 0–99)
NonHDL: 125.53
Total CHOL/HDL Ratio: 4
Triglycerides: 178 mg/dL — ABNORMAL HIGH (ref 0.0–149.0)
VLDL: 35.6 mg/dL (ref 0.0–40.0)

## 2020-05-08 LAB — VITAMIN B12: Vitamin B-12: >1526 pg/mL — ABNORMAL HIGH (ref 211–911)

## 2020-05-08 LAB — COMPREHENSIVE METABOLIC PANEL WITH GFR
ALT: 14 U/L (ref 0–35)
AST: 25 U/L (ref 0–37)
Albumin: 4.2 g/dL (ref 3.5–5.2)
Alkaline Phosphatase: 56 U/L (ref 39–117)
BUN: 15 mg/dL (ref 6–23)
CO2: 30 meq/L (ref 19–32)
Calcium: 9.5 mg/dL (ref 8.4–10.5)
Chloride: 106 meq/L (ref 96–112)
Creatinine, Ser: 0.72 mg/dL (ref 0.40–1.20)
GFR: 77.11 mL/min
Glucose, Bld: 64 mg/dL — ABNORMAL LOW (ref 70–99)
Potassium: 4.2 meq/L (ref 3.5–5.1)
Sodium: 141 meq/L (ref 135–145)
Total Bilirubin: 0.4 mg/dL (ref 0.2–1.2)
Total Protein: 6.7 g/dL (ref 6.0–8.3)

## 2020-05-08 LAB — CBC WITH DIFFERENTIAL/PLATELET
Basophils Absolute: 0.1 K/uL (ref 0.0–0.1)
Basophils Relative: 1.4 % (ref 0.0–3.0)
Eosinophils Absolute: 0.1 K/uL (ref 0.0–0.7)
Eosinophils Relative: 1.7 % (ref 0.0–5.0)
HCT: 40 % (ref 36.0–46.0)
Hemoglobin: 13.3 g/dL (ref 12.0–15.0)
Lymphocytes Relative: 42.6 % (ref 12.0–46.0)
Lymphs Abs: 2.4 K/uL (ref 0.7–4.0)
MCHC: 33.2 g/dL (ref 30.0–36.0)
MCV: 94.2 fl (ref 78.0–100.0)
Monocytes Absolute: 0.3 K/uL (ref 0.1–1.0)
Monocytes Relative: 6 % (ref 3.0–12.0)
Neutro Abs: 2.7 K/uL (ref 1.4–7.7)
Neutrophils Relative %: 48.3 % (ref 43.0–77.0)
Platelets: 230 K/uL (ref 150.0–400.0)
RBC: 4.25 Mil/uL (ref 3.87–5.11)
RDW: 14.7 % (ref 11.5–15.5)
WBC: 5.7 K/uL (ref 4.0–10.5)

## 2020-05-08 LAB — MAGNESIUM: Magnesium: 1.9 mg/dL (ref 1.5–2.5)

## 2020-05-08 LAB — TSH: TSH: 0.74 u[IU]/mL (ref 0.35–4.50)

## 2020-05-08 MED ORDER — DULOXETINE HCL 30 MG PO CPEP: 30.0000 mg | ORAL_CAPSULE | Freq: Every day | ORAL | 0 refills | Status: DC

## 2020-05-08 MED ORDER — OLOPATADINE HCL 0.1 % OP SOLN: OPHTHALMIC | 1 refills | Status: DC

## 2020-05-08 NOTE — Progress Notes (Signed)
[name] is a 85 y.o. female with the following history as recorded in EpicCare:  Patient Active Problem List   Diagnosis Date Noted  . Primary osteoarthritis of left knee 09/22/2019  . Chronic diarrhea 02/24/2018  . Postoperative atrial fibrillation ([place]) 01/14/2018  . Post-traumatic pericarditis 01/14/2018  . Vasovagal syncope 04/09/2017  . Right shoulder injury, initial encounter 02/25/2017  . Hypertropia of left eye 09/09/2016  . Monocular esotropia of left eye 09/09/2016  . Chronic diastolic heart failure ([place]) 05/29/2016  . History of pericarditis 05/29/2016  . Sinus node dysfunction ([place]) 02/05/2016  . Insomnia 10/16/2015  . Grade 3 follicular lymphoma of lymph nodes of neck ([place]) 06/06/2015  . Peripheral neuropathy 12/26/2014  . Heart palpitations 06/04/2014  . Cyst in hand 03/14/2014  . Hearing loss 04/27/2013  . Hip pain 04/27/2013  . Need for prophylactic vaccination and inoculation against influenza 04/27/2013  . Multinodular goiter 02/21/2013  . Pacemaker 02/11/2013  . Pericardial effusion after pacemaker, hospitalized at [facility] 09/25/11 09/29/2011  . Dyslipidemia, LDL 136 08/21/2011  . Hiatal hernia, large by CXR 08/21/2011  . Scoliosis 08/21/2011  . NHL (non-Hodgkin's lymphoma)-recent diagnosis 07/09/2011  . Anxiety 07/09/2011  . Sinus bradycardia, symptomatic, s/p MDT PTVDP 09/15/11 02/11/2011  . Essential hypertension 01/26/2011  . Osteoporosis 09/20/2009  . DIASTOLIC DYSFUNCTION, on 2D Jan 2011 (grade 1) 08/13/2009  . PAP SMEAR, ABNORMAL 07/04/2009  . BACK PAIN 12/07/2008  . THYROID NODULE, RIGHT 10/30/2008  . Hypothyroidism 10/30/2008  . ADJUSTMENT DISORDER WITH DEPRESSED MOOD 10/30/2008  . GERD 10/30/2008    Current Outpatient Medications  Medication Sig Dispense Refill  . acetaminophen (TYLENOL) 500 MG tablet Take 500 mg by mouth every 6 (six) hours as needed (for pain.).     [name] Calcium Carbonate-Vitamin D (CALTRATE 600+D) 600-400 MG-UNIT  per tablet Take 1 tablet by mouth 2 (two) times daily.    [name] denosumab (PROLIA) 60 MG/ML SOLN injection Inject 60 mg into the skin every 6 (six) months. Reported on 10/16/2015    . furosemide (LASIX) 20 MG tablet TAKE 1 TABLET BY MOUTH ONCE DAILY 90 tablet 2  . gabapentin (NEURONTIN) 300 MG capsule TAKE 1 CAPSULE(300 MG) BY MOUTH THREE TIMES DAILY AS NEEDED FOR NERVE PAIN 90 capsule 5  . HIZENTRA 4 GM/20ML SOLN Inject 4 g into the skin every Wednesday.     [name] ibuprofen (ADVIL,MOTRIN) 200 MG tablet Take 200-400 mg by mouth every 6 (six) hours as needed (for pain.).    [name] levothyroxine (SYNTHROID) 100 MCG tablet Take 1 tablet (100 mcg total) by mouth daily. 90 tablet 3  . lisinopril (ZESTRIL) 20 MG tablet TAKE 1 TABLET(20 MG) BY MOUTH TWICE DAILY 180 tablet 3  . Magnesium Oxide (MAG-OX 400 PO) Take 1 tablet by mouth daily.    . metoprolol tartrate (LOPRESSOR) 25 MG tablet TAKE 1 TABLET(25 MG) BY MOUTH DAILY. MAY TAKE 1/2 TABLET EXTRA IF SYSTOLIC BLOOD PRESSURE IS ABOVE 160 90 tablet 3  . olopatadine (PATANOL) 0.1 % ophthalmic solution Place 1 drop into both eyes 2 times daily. 5 mL 1  . omeprazole (PRILOSEC) 40 MG capsule TAKE 1 CAPSULE(40 MG) BY MOUTH DAILY 90 capsule 3  . potassium chloride (KLOR-CON) 10 MEQ tablet TAKE 1 TABLET(10 MEQ) BY MOUTH DAILY 90 tablet 0  . DULoxetine (CYMBALTA) 30 MG capsule Take 1 capsule (30 mg total) by mouth daily. 90 capsule 0   No current facility-administered medications for this visit.    Allergies: Amoxicillin, Buprenorphine, Codeine, Demerol,  Morphine and related, Sulfonamide derivatives, and Vicodin [hydrocodone-acetaminophen]  Past Medical History:  Diagnosis Date  . Anemia    pt denies  . Arthritis   . Atrial fibrillation ([place])   . Cataract    bil  . Cystocele    history with rectocele, pt denies  . Dysrhythmia   . GERD (gastroesophageal reflux disease)   . H/O hiatal hernia   . Hemorrhoid   . History of pancreatitis    pt denies and denies as of  today 08/09/2017  . Hypertension   . Hypothyroidism   . Non Hodgkin's lymphoma ([place])    dx 2012 and now in remission  . Osteoporosis   . Pacemaker 09/15/2011   [employer]tronic Revo  . Pericarditis   . Pleural effusion, bilateral    history of  . Pulmonary nodules    history of  . Scoliosis   . Varicose vein     Past Surgical History:  Procedure Laterality Date  . BREAST SURGERY  07/30/01   biopsy x 3, bilateral  . CATARACT EXTRACTION Bilateral   . COLONOSCOPY    . MASS EXCISION Right 10/28/2012   Procedure: Removal of mass on neck       ;  Surgeon: Adin Hector, MD;  Location: [facility];  Service: General;  Laterality: Right;  . PARTIAL HYSTERECTOMY  04/29/2004  . PERMANENT PACEMAKER INSERTION N/A 09/15/2011   Procedure: PERMANENT PACEMAKER INSERTION;  Surgeon: Sanda Klein, MD;  Location: [facility] [place] CATH LAB;  Service: Cardiovascular;  Laterality: N/A;  . RADIOACTIVE SEED GUIDED EXCISIONAL BREAST BIOPSY Left 08/11/2017   Procedure: LEFT RADIOACTIVE SEED GUIDED EXCISIONAL BREAST BIOPSY ERAS PATHWAY;  Surgeon: Stark Klein, MD;  Location: [facility];  Service: General;  Laterality: Left;  . TONSILLECTOMY      Family History  Problem Relation Age of Onset  . Stroke Mother   . Stroke Father   . Heart disease Daughter        congenital "whole in her heart"  . Lymphoma Daughter        nonhodgkins  . Breast cancer Sister   . Heart disease Brother   . Colon cancer Neg Hx   . Esophageal cancer Neg Hx   . Pancreatic cancer Neg Hx   . Stomach cancer Neg Hx   . Liver disease Neg Hx     Social History   Tobacco Use  . Smoking status: Never Smoker  . Smokeless tobacco: Never Used  Substance Use Topics  . Alcohol use: Yes    Comment: 1 glass of wine occasionally    Subjective:  Presents with numerous concerns:  1) Muscle cramps in legs especially at night- x 1-2 years; 2) Drowsiness of Gabapentin; wonders about alternative; 3) concern for bilateral foot pain- instability; 4) Feels like  circulation in lower extremities is not good- notes that 5th toe on right foot can sometimes turn blue  Objective:  Vitals:   05/08/20 1211  BP: 130/82  Pulse: 68  Temp: 97.8 F (36.6 C)  TempSrc: Oral  SpO2: 96%  Weight: 143 lb (64.9 kg)  Height: $Remove'5\' 2"'zOydiqX$  (1.575 m)    General: Well developed, well nourished, in no acute distress  Skin : Warm and dry.  Head: Normocephalic and atraumatic  Eyes: Sclera and conjunctiva clear; pupils round and reactive to light; extraocular movements intact  Ears: External normal; canals clear; tympanic membranes normal  Oropharynx: Pink, supple. No suspicious lesions  Neck: Supple without thyromegaly, adenopathy  Lungs: Respirations unlabored; clear to  auscultation bilaterally without wheeze, rales, rhonchi  Musculoskeletal: No deformities; no active joint inflammation  Extremities: No edema, cyanosis, clubbing  Vessels: Symmetric bilaterally  Neurologic: Alert and oriented; speech intact; face symmetrical; moves all extremities well; CNII-XII intact without focal deficit   Assessment:  1. Pain in both feet   2. Cold extremities   3. Hyperlipidemia, unspecified hyperlipidemia type   4. Hypothyroidism, unspecified type   5. Low vitamin B12 level   6. Needs flu shot   7. Other polyneuropathy     Plan:  1. Refer to podiatry; 2. ? Vascular source; refer for consult; 3. Check lipid panel- not currently on statin; 4. Check TSH; 5. Check B12 level;  6. Flu shot given; 7. Trial of Duloxetine- can use this during the day and Gabapentin at night; call back with response in 2 weeks so dosing can be adjusted;   This visit occurred during the SARS-CoV-2 public health emergency.  Safety protocols were in place, including screening questions prior to the visit, additional usage of staff PPE, and extensive cleaning of exam room while observing appropriate contact time as indicated for disinfecting solutions.     No follow-ups on file.  Orders Placed This  Encounter  Procedures  . Flu Vaccine QUAD High Dose(Fluad)  . CBC with Differential/Platelet    Standing Status:   Future    Number of Occurrences:   1    Standing Expiration Date:   05/08/2021  . Comp Met (CMET)    Standing Status:   Future    Number of Occurrences:   1    Standing Expiration Date:   05/08/2021  . Lipid panel    Standing Status:   Future    Number of Occurrences:   1    Standing Expiration Date:   05/08/2021  . Magnesium    Standing Status:   Future    Number of Occurrences:   1    Standing Expiration Date:   05/08/2021  . TSH    Standing Status:   Future    Number of Occurrences:   1    Standing Expiration Date:   05/08/2021  . B12    Standing Status:   Future    Number of Occurrences:   1    Standing Expiration Date:   05/08/2021  . Ambulatory referral to Podiatry    Referral Priority:   Routine    Referral Type:   Consultation    Referral Reason:   Specialty Services Required    Requested Specialty:   Podiatry    Number of Visits Requested:   1  . Ambulatory referral to Vascular Surgery    Referral Priority:   Routine    Referral Type:   Surgical    Referral Reason:   Specialty Services Required    Requested Specialty:   Vascular Surgery    Number of Visits Requested:   1    Requested Prescriptions   Signed Prescriptions Disp Refills  . olopatadine (PATANOL) 0.1 % ophthalmic solution 5 mL 1    Sig: Place 1 drop into both eyes 2 times daily.  . DULoxetine (CYMBALTA) 30 MG capsule 90 capsule 0    Sig: Take 1 capsule (30 mg total) by mouth daily.

## 2020-05-12 DIAGNOSIS — D839 Common variable immunodeficiency, unspecified: Secondary | ICD-10-CM | POA: Diagnosis not present

## 2020-05-15 ENCOUNTER — Ambulatory Visit (INDEPENDENT_AMBULATORY_CARE_PROVIDER_SITE_OTHER): Payer: Medicare Other

## 2020-05-15 DIAGNOSIS — R001 Bradycardia, unspecified: Secondary | ICD-10-CM

## 2020-05-16 LAB — CUP PACEART REMOTE DEVICE CHECK
Battery Voltage: 2.89 V
Brady Statistic RA Percent Paced: 98.18 %
Date Time Interrogation Session: 20211117153741
Implantable Lead Implant Date: 20130319
Implantable Lead Implant Date: 20130319
Implantable Lead Location: 753859
Implantable Lead Location: 753860
Implantable Pulse Generator Implant Date: 20130319
Lead Channel Impedance Value: 392 Ohm
Lead Channel Impedance Value: 464 Ohm
Lead Channel Sensing Intrinsic Amplitude: 1.818 mV
Lead Channel Sensing Intrinsic Amplitude: 15.35 mV
Lead Channel Setting Pacing Amplitude: 2 V
Lead Channel Setting Sensing Sensitivity: 0.9 mV

## 2020-05-16 NOTE — Progress Notes (Signed)
Remote pacemaker transmission.   

## 2020-05-19 DIAGNOSIS — D839 Common variable immunodeficiency, unspecified: Secondary | ICD-10-CM | POA: Diagnosis not present

## 2020-05-23 ENCOUNTER — Other Ambulatory Visit: Payer: Self-pay | Admitting: Physician Assistant

## 2020-05-26 DIAGNOSIS — D839 Common variable immunodeficiency, unspecified: Secondary | ICD-10-CM | POA: Diagnosis not present

## 2020-05-29 ENCOUNTER — Ambulatory Visit: Payer: Medicare Other | Admitting: Sports Medicine

## 2020-05-29 ENCOUNTER — Other Ambulatory Visit: Payer: Self-pay

## 2020-05-29 ENCOUNTER — Encounter: Payer: Self-pay | Admitting: Sports Medicine

## 2020-05-29 DIAGNOSIS — M21619 Bunion of unspecified foot: Secondary | ICD-10-CM | POA: Diagnosis not present

## 2020-05-29 DIAGNOSIS — G588 Other specified mononeuropathies: Secondary | ICD-10-CM | POA: Diagnosis not present

## 2020-05-29 DIAGNOSIS — M79671 Pain in right foot: Secondary | ICD-10-CM | POA: Diagnosis not present

## 2020-05-29 DIAGNOSIS — M2042 Other hammer toe(s) (acquired), left foot: Secondary | ICD-10-CM | POA: Diagnosis not present

## 2020-05-29 DIAGNOSIS — M2041 Other hammer toe(s) (acquired), right foot: Secondary | ICD-10-CM

## 2020-05-29 DIAGNOSIS — S83209A Unspecified tear of unspecified meniscus, current injury, unspecified knee, initial encounter: Secondary | ICD-10-CM | POA: Insufficient documentation

## 2020-05-29 DIAGNOSIS — M79672 Pain in left foot: Secondary | ICD-10-CM

## 2020-05-29 NOTE — Progress Notes (Signed)
Subjective: [name] is a 85 y.o. female patient who presents to office for evaluation of numbness and tingling to both feet patient reports that there is discomfort from the tingling but no significant pain and is concerned because the numbness and tingling is getting worse reports that she has a history of scoliosis and is on gabapentin as prescribed by her back doctor and was told in the past that she needed an injection in her back but has not been able to arrange transportation for this procedure to be performed.  Patient also admits that her toes curled down and grabbed the ground when she attempts to walk or stand.  Patient denies any other pedal complaints.   Review of system noncontributory  Patient Active Problem List   Diagnosis Date Noted  . Tear of meniscus of knee 05/29/2020  . Primary osteoarthritis of left knee 09/22/2019  . Chronic diarrhea 02/24/2018  . Postoperative atrial fibrillation ([place]) 01/14/2018  . Post-traumatic pericarditis 01/14/2018  . Vasovagal syncope 04/09/2017  . Right shoulder injury, initial encounter 02/25/2017  . Hypertropia of left eye 09/09/2016  . Monocular esotropia of left eye 09/09/2016  . Chronic diastolic heart failure ([place]) 05/29/2016  . History of pericarditis 05/29/2016  . Sinus node dysfunction ([place]) 02/05/2016  . Insomnia 10/16/2015  . Grade 3 follicular lymphoma of lymph nodes of neck ([place]) 06/06/2015  . Peripheral neuropathy 12/26/2014  . Heart palpitations 06/04/2014  . Cyst in hand 03/14/2014  . Hearing loss 04/27/2013  . Hip pain 04/27/2013  . Need for prophylactic vaccination and inoculation against influenza 04/27/2013  . Multinodular goiter 02/21/2013  . Pacemaker 02/11/2013  . Pericardial effusion after pacemaker, hospitalized at [facility] 09/25/11 09/29/2011  . Dyslipidemia, LDL 136 08/21/2011  . Hiatal hernia, large by CXR 08/21/2011  . Scoliosis 08/21/2011  . NHL (non-Hodgkin's lymphoma)-recent diagnosis  07/09/2011  . Anxiety 07/09/2011  . Sinus bradycardia, symptomatic, s/p MDT PTVDP 09/15/11 02/11/2011  . Essential hypertension 01/26/2011  . Osteoporosis 09/20/2009  . DIASTOLIC DYSFUNCTION, on 2D Jan 2011 (grade 1) 08/13/2009  . PAP SMEAR, ABNORMAL 07/04/2009  . BACK PAIN 12/07/2008  . THYROID NODULE, RIGHT 10/30/2008  . Hypothyroidism 10/30/2008  . ADJUSTMENT DISORDER WITH DEPRESSED MOOD 10/30/2008  . GERD 10/30/2008    Current Outpatient Medications on File Prior to Visit  Medication Sig Dispense Refill  . acetaminophen (TYLENOL) 500 MG tablet Take 500 mg by mouth every 6 (six) hours as needed (for pain.).     [name] Calcium Carbonate-Vitamin D (CALTRATE 600+D) 600-400 MG-UNIT per tablet Take 1 tablet by mouth 2 (two) times daily.    [name] denosumab (PROLIA) 60 MG/ML SOLN injection Inject 60 mg into the skin every 6 (six) months. Reported on 10/16/2015    . gabapentin (NEURONTIN) 300 MG capsule TAKE 1 CAPSULE(300 MG) BY MOUTH THREE TIMES DAILY AS NEEDED FOR NERVE PAIN 90 capsule 5  . ibuprofen (ADVIL,MOTRIN) 200 MG tablet Take 200-400 mg by mouth every 6 (six) hours as needed (for pain.).    [name] levothyroxine (SYNTHROID) 100 MCG tablet Take 1 tablet (100 mcg total) by mouth daily. 90 tablet 3  . lisinopril (ZESTRIL) 20 MG tablet TAKE 1 TABLET(20 MG) BY MOUTH TWICE DAILY 180 tablet 3  . Magnesium Oxide (MAG-OX 400 PO) Take 1 tablet by mouth daily.    . metoprolol tartrate (LOPRESSOR) 25 MG tablet TAKE 1 TABLET(25 MG) BY MOUTH DAILY. MAY TAKE 1/2 TABLET EXTRA IF SYSTOLIC BLOOD PRESSURE IS ABOVE 160 90 tablet 3  .  olopatadine (PATANOL) 0.1 % ophthalmic solution Place 1 drop into both eyes 2 times daily. 5 mL 1  . omeprazole (PRILOSEC) 40 MG capsule TAKE 1 CAPSULE(40 MG) BY MOUTH DAILY 90 capsule 3  . potassium chloride (KLOR-CON) 10 MEQ tablet TAKE 1 TABLET(10 MEQ) BY MOUTH DAILY 90 tablet 0   No current facility-administered medications on file prior to visit.    Allergies  Allergen Reactions   . Amoxicillin Hives    Has patient had a PCN reaction causing immediate rash, facial/tongue/throat swelling, SOB or lightheadedness with hypotension: No Has patient had a PCN reaction causing severe rash involving mucus membranes or skin necrosis: No Has patient had a PCN reaction that required hospitalization: No Has patient had a PCN reaction occurring within the last 10 years: No If all of the above answers are "NO", then may proceed with Cephalosporin use.   . Buprenorphine Hives  . Codeine Hives    REACTION: "makes her crazy" Hives  . Demerol Hives  . Morphine And Related Hives  . Sulfonamide Derivatives Rash    REACTION: Rash  . Vicodin [Hydrocodone-Acetaminophen] Hives    Objective:  General: Alert and oriented x3 in no acute distress  Dermatology: No open lesions bilateral lower extremities, no webspace macerations, no ecchymosis bilateral, all nails x 10 are well manicured.  Vascular: Dorsalis Pedis and Posterior Tibial pedal pulses palpable, Capillary Fill Time 5 seconds,(+) scant pedal hair growth bilateral, no edema bilateral lower extremities, varicosities bilateral, temperature gradient within normal limits.  Neurology: Gross sensation intact via light touch bilateral, decreased monofilament to all pedal sites bilateral, vibratory sensation absent bilaterally.  Musculoskeletal: No reproducible tenderness to palpation bilateral however there is significant bunion and hammertoe deformity and fat pad atrophy noted bilateral.    Assessment and Plan: Problem List Items Addressed This Visit      Nervous and Auditory   Peripheral neuropathy - Primary    Other Visit Diagnoses    Bunion       Hammer toes of both feet       Foot pain, bilateral           -Complete examination performed -Discussed treatment options for peripheral neuropathy possibly related to back versus mechanical -Rx power steps for patient to use as instructed -Advised patient to continue with  gabapentin as prescribed by her back doctor -Advised patient that she may use over-the-counter topical pain creams and rubs as needed -Patient to return to office as needed or sooner if condition worsens.  Landis Martins, DPM

## 2020-05-29 NOTE — Patient Instructions (Signed)
For tennis shoes recommend:  Brooks Beast Ascis New balance Saucony Can be purchased at [employer] or [employer] Sketchers arch fit Can be purchased at any major retailers  Vionic  SAS Can be purchased at [employer] or [employer]   For work shoes recommend: Sketchers Work Timberland boots  Redwing boots Can be purchased at a variety of places or [employer]   For casual shoes recommend: Vionic  Can be purchased at [employer] or [employer]   For Over the Counter Orthotics recommend: Power Steps Can be purchased in office/[facility] Superfeet Can be purchased at [employer] or [employer] Airplus Can be purchased at [employer]  

## 2020-06-02 DIAGNOSIS — D839 Common variable immunodeficiency, unspecified: Secondary | ICD-10-CM | POA: Diagnosis not present

## 2020-06-09 DIAGNOSIS — D839 Common variable immunodeficiency, unspecified: Secondary | ICD-10-CM | POA: Diagnosis not present

## 2020-06-11 ENCOUNTER — Other Ambulatory Visit: Payer: Self-pay

## 2020-06-11 DIAGNOSIS — I739 Peripheral vascular disease, unspecified: Secondary | ICD-10-CM

## 2020-06-13 ENCOUNTER — Telehealth: Payer: Self-pay | Admitting: Family

## 2020-06-13 NOTE — Telephone Encounter (Signed)
    Patient requesting RX for gabapentin (NEURONTIN) 300 MG capsule be changed to 4 times daily, not PRN. Also DULoxetine HCl 30 mg Oral Daily causing nausea

## 2020-06-14 ENCOUNTER — Other Ambulatory Visit: Payer: Self-pay

## 2020-06-14 ENCOUNTER — Inpatient Hospital Stay: Payer: Medicare Other | Attending: Internal Medicine

## 2020-06-14 ENCOUNTER — Ambulatory Visit (HOSPITAL_COMMUNITY)
Admission: RE | Admit: 2020-06-14 | Discharge: 2020-06-14 | Disposition: A | Discharge status: Home / Self Care | Payer: Medicare Other | Source: Ambulatory Visit | Attending: Internal Medicine | Admitting: Internal Medicine

## 2020-06-14 ENCOUNTER — Other Ambulatory Visit: Payer: Self-pay | Admitting: Family

## 2020-06-14 DIAGNOSIS — I313 Pericardial effusion (noninflammatory): Secondary | ICD-10-CM | POA: Diagnosis not present

## 2020-06-14 DIAGNOSIS — K449 Diaphragmatic hernia without obstruction or gangrene: Secondary | ICD-10-CM | POA: Diagnosis not present

## 2020-06-14 DIAGNOSIS — K439 Ventral hernia without obstruction or gangrene: Secondary | ICD-10-CM | POA: Diagnosis not present

## 2020-06-14 DIAGNOSIS — Z8572 Personal history of non-Hodgkin lymphomas: Secondary | ICD-10-CM | POA: Insufficient documentation

## 2020-06-14 DIAGNOSIS — C8221 Follicular lymphoma grade III, unspecified, lymph nodes of head, face, and neck: Secondary | ICD-10-CM | POA: Diagnosis not present

## 2020-06-14 DIAGNOSIS — K458 Other specified abdominal hernia without obstruction or gangrene: Secondary | ICD-10-CM | POA: Diagnosis not present

## 2020-06-14 LAB — CMP (CANCER CENTER ONLY)
ALT: 19 U/L (ref 0–44)
AST: 28 U/L (ref 15–41)
Albumin: 4.4 g/dL (ref 3.5–5.0)
Alkaline Phosphatase: 61 U/L (ref 38–126)
Anion gap: 7 (ref 5–15)
BUN: 12 mg/dL (ref 8–23)
CO2: 26 mmol/L (ref 22–32)
Calcium: 9.8 mg/dL (ref 8.9–10.3)
Chloride: 107 mmol/L (ref 98–111)
Creatinine: 0.8 mg/dL (ref 0.44–1.00)
GFR, Estimated: >60 mL/min (ref >60)
Glucose, Bld: 86 mg/dL (ref 70–99)
Potassium: 4.2 mmol/L (ref 3.5–5.1)
Sodium: 140 mmol/L (ref 135–145)
Total Bilirubin: 0.6 mg/dL (ref 0.3–1.2)
Total Protein: 7.2 g/dL (ref 6.5–8.1)

## 2020-06-14 LAB — CBC WITH DIFFERENTIAL (CANCER CENTER ONLY)
Abs Immature Granulocytes: 0.02 10*3/uL (ref 0.00–0.07)
Basophils Absolute: 0.1 10*3/uL (ref 0.0–0.1)
Basophils Relative: 1 %
Eosinophils Absolute: 0.1 10*3/uL (ref 0.0–0.5)
Eosinophils Relative: 2 %
HCT: 40.6 % (ref 36.0–46.0)
Hemoglobin: 13.4 g/dL (ref 12.0–15.0)
Immature Granulocytes: 0 %
Lymphocytes Relative: 46 %
Lymphs Abs: 3 10*3/uL (ref 0.7–4.0)
MCH: 31.6 pg (ref 26.0–34.0)
MCHC: 33 g/dL (ref 30.0–36.0)
MCV: 95.8 fL (ref 80.0–100.0)
Monocytes Absolute: 0.5 10*3/uL (ref 0.1–1.0)
Monocytes Relative: 7 %
Neutro Abs: 2.9 10*3/uL (ref 1.7–7.7)
Neutrophils Relative %: 44 %
Platelet Count: 239 10*3/uL (ref 150–400)
RBC: 4.24 MIL/uL (ref 3.87–5.11)
RDW: 14.2 % (ref 11.5–15.5)
WBC Count: 6.6 10*3/uL (ref 4.0–10.5)
nRBC: 0 % (ref 0.0–0.2)

## 2020-06-14 LAB — LACTATE DEHYDROGENASE: LDH: 247 U/L — ABNORMAL HIGH (ref 98–192)

## 2020-06-14 MED ORDER — GABAPENTIN 300 MG PO CAPS
300.0000 mg | ORAL_CAPSULE | Freq: Four times a day (QID) | ORAL | 4 refills | Status: DC
Start: 2020-06-14 — End: 2020-10-09

## 2020-06-14 MED ORDER — IOHEXOL 300 MG/ML  SOLN
100.0000 mL | Freq: Once | INTRAMUSCULAR | Status: AC | PRN
  Administered 2020-06-14: 15:00:00 100 mL via INTRAVENOUS

## 2020-06-14 MED ORDER — SODIUM CHLORIDE (PF) 0.9 % IJ SOLN
INTRAMUSCULAR | Status: AC
  Filled 2020-06-14: qty 50

## 2020-06-14 NOTE — Telephone Encounter (Signed)
Someone answer and stated patient was unavailable so I that they have her call us back.

## 2020-06-14 NOTE — Telephone Encounter (Signed)
The prescription has been changed as requested to show she is taking 300 mg qid of the Neurontin. I sent in a new prescription for her so she has correct prescription.   Let's have her taper off the Duloxetine since it is not helping and causing side effects; she can take every other day x 7 days and then ever 3rd day x 5 days and then stop.

## 2020-06-16 DIAGNOSIS — D839 Common variable immunodeficiency, unspecified: Secondary | ICD-10-CM | POA: Diagnosis not present

## 2020-06-17 ENCOUNTER — Inpatient Hospital Stay (HOSPITAL_BASED_OUTPATIENT_CLINIC_OR_DEPARTMENT_OTHER): Payer: Medicare Other | Admitting: Internal Medicine

## 2020-06-17 ENCOUNTER — Encounter: Payer: Self-pay | Admitting: Internal Medicine

## 2020-06-17 ENCOUNTER — Other Ambulatory Visit: Payer: Self-pay

## 2020-06-17 VITALS — BP 132/84 | HR 100 | Temp 98.4°F | Resp 18 | Ht 62.0 in | Wt 137.1 lb

## 2020-06-17 DIAGNOSIS — I10 Essential (primary) hypertension: Secondary | ICD-10-CM | POA: Diagnosis not present

## 2020-06-17 DIAGNOSIS — C8221 Follicular lymphoma grade III, unspecified, lymph nodes of head, face, and neck: Secondary | ICD-10-CM | POA: Diagnosis not present

## 2020-06-17 DIAGNOSIS — Z8572 Personal history of non-Hodgkin lymphomas: Secondary | ICD-10-CM | POA: Diagnosis not present

## 2020-06-17 NOTE — Progress Notes (Signed)
[facility] Telephone:([phone]) [phone]   Fax:([phone]) [phone]  OFFICE PROGRESS NOTE  [address], [place] [address]  DIAGNOSIS: Recurrent non-Hodgkin lymphoma, follicular center cell type with predominant follicular pattern favor high-grade (grade 3/3) diagnosed in November 2012.  PRIOR THERAPY: 1) Status post treatment with Rituxan weekly for 3 doses in addition to 3 tablets of Afinitor at [facility] in [place].  2) Status post surgical excision of the right neck mass under the care of Dr. Johney Maine on 10/28/2012.  3)  Maintenance Rituxan 375 mg/M2 every 2 months status post 12 cycles, last dose was given 10/01/2014.  CURRENT THERAPY: Observation.  INTERVAL HISTORY: CONSUELLO LASSALLE 85 y.o. female returns to the clinic today for annual follow-up visit.  The patient is feeling fine today with no concerning complaints.  She denied having any chest pain, shortness of breath, cough or hemoptysis.  She denied having any fever or chills.  She has no nausea, vomiting, diarrhea or constipation.  She has no significant weight loss or night sweats.  She is here today for evaluation with repeat CT scan of the chest, abdomen pelvis for restaging of her disease.   MEDICAL HISTORY: Past Medical History:  Diagnosis Date  . Anemia    pt denies  . Arthritis   . Atrial fibrillation ([place])   . Cataract    bil  . Cystocele    history with rectocele, pt denies  . Dysrhythmia   . GERD (gastroesophageal reflux disease)   . H/O hiatal hernia   . Hemorrhoid   . History of pancreatitis    pt denies and denies as of today 08/09/2017  . Hypertension   . Hypothyroidism   . Non Hodgkin's lymphoma ([place])    dx 2012 and now in remission  . Osteoporosis   . Pacemaker 09/15/2011   Medtronic Revo  . Pericarditis   . Pleural effusion, bilateral    history of  . Pulmonary nodules    history of  . Scoliosis   . Varicose vein     ALLERGIES:  is  allergic to amoxicillin, buprenorphine, codeine, demerol, morphine and related, sulfonamide derivatives, and vicodin [hydrocodone-acetaminophen].  MEDICATIONS:  Current Outpatient Medications  Medication Sig Dispense Refill  . acetaminophen (TYLENOL) 500 MG tablet Take 500 mg by mouth every 6 (six) hours as needed (for pain.).     [name] Calcium Carbonate-Vitamin D (CALTRATE 600+D) 600-400 MG-UNIT per tablet Take 1 tablet by mouth 2 (two) times daily.    [name] denosumab (PROLIA) 60 MG/ML SOLN injection Inject 60 mg into the skin every 6 (six) months. Reported on 10/16/2015    . gabapentin (NEURONTIN) 300 MG capsule Take 1 capsule (300 mg total) by mouth 4 (four) times daily. 120 capsule 4  . ibuprofen (ADVIL,MOTRIN) 200 MG tablet Take 200-400 mg by mouth every 6 (six) hours as needed (for pain.).    [name] levothyroxine (SYNTHROID) 100 MCG tablet Take 1 tablet (100 mcg total) by mouth daily. 90 tablet 3  . lisinopril (ZESTRIL) 20 MG tablet TAKE 1 TABLET(20 MG) BY MOUTH TWICE DAILY 180 tablet 3  . Magnesium Oxide (MAG-OX 400 PO) Take 1 tablet by mouth daily.    . metoprolol tartrate (LOPRESSOR) 25 MG tablet TAKE 1 TABLET(25 MG) BY MOUTH DAILY. MAY TAKE 1/2 TABLET EXTRA IF SYSTOLIC BLOOD PRESSURE IS ABOVE 160 90 tablet 3  . olopatadine (PATANOL) 0.1 % ophthalmic solution Place 1 drop into both eyes 2  times daily. 5 mL 1  . omeprazole (PRILOSEC) 40 MG capsule TAKE 1 CAPSULE(40 MG) BY MOUTH DAILY 90 capsule 3  . potassium chloride (KLOR-CON) 10 MEQ tablet TAKE 1 TABLET(10 MEQ) BY MOUTH DAILY 90 tablet 0   No current facility-administered medications for this visit.    SURGICAL HISTORY:  Past Surgical History:  Procedure Laterality Date  . BREAST SURGERY  07/30/01   biopsy x 3, bilateral  . CATARACT EXTRACTION Bilateral   . COLONOSCOPY    . MASS EXCISION Right 10/28/2012   Procedure: Removal of mass on neck       ;  Surgeon: Adin Hector, MD;  Location: [facility];  Service: General;  Laterality: Right;  .  PARTIAL HYSTERECTOMY  04/29/2004  . PERMANENT PACEMAKER INSERTION N/A 09/15/2011   Procedure: PERMANENT PACEMAKER INSERTION;  Surgeon: Sanda Klein, MD;  Location: [facility] CATH LAB;  Service: Cardiovascular;  Laterality: N/A;  . RADIOACTIVE SEED GUIDED EXCISIONAL BREAST BIOPSY Left 08/11/2017   Procedure: LEFT RADIOACTIVE SEED GUIDED EXCISIONAL BREAST BIOPSY ERAS PATHWAY;  Surgeon: Stark Klein, MD;  Location: [facility];  Service: General;  Laterality: Left;  . TONSILLECTOMY      REVIEW OF SYSTEMS:  A comprehensive review of systems was negative.   PHYSICAL EXAMINATION: General appearance: alert, cooperative and no distress Head: Normocephalic, without obvious abnormality, atraumatic Neck: no adenopathy, no JVD, supple, symmetrical, trachea midline and thyroid not enlarged, symmetric, no tenderness/mass/nodules Lymph nodes: Cervical, supraclavicular, and axillary nodes normal. Resp: clear to auscultation bilaterally Back: symmetric, no curvature. ROM normal. No CVA tenderness. Cardio: regular rate and rhythm, S1, S2 normal, no murmur, click, rub or gallop GI: soft, non-tender; bowel sounds normal; no masses,  no organomegaly Extremities: extremities normal, atraumatic, no cyanosis or edema  ECOG PERFORMANCE STATUS: 1 - Symptomatic but completely ambulatory  Blood pressure 132/84, pulse 100, temperature 98.4 F (36.9 C), temperature source Tympanic, resp. rate 18, height 5\' 2"  (1.575 m), weight 137 lb 1.6 oz (62.2 kg), SpO2 97 %.  LABORATORY DATA: Lab Results  Component Value Date   WBC 6.6 06/14/2020   HGB 13.4 06/14/2020   HCT 40.6 06/14/2020   MCV 95.8 06/14/2020   PLT 239 06/14/2020      Chemistry      Component Value Date/Time   NA 140 06/14/2020 1335   NA 143 11/26/2017 1159   NA 143 06/16/2017 0921   K 4.2 06/14/2020 1335   K 3.8 06/16/2017 0921   CL 107 06/14/2020 1335   CL 109 (H) 12/14/2012 1009   CO2 26 06/14/2020 1335   CO2 28 06/16/2017 0921   BUN 12 06/14/2020 1335    BUN 18 11/26/2017 1159   BUN 11.7 06/16/2017 0921   CREATININE 0.80 06/14/2020 1335   CREATININE 0.8 06/16/2017 0921      Component Value Date/Time   CALCIUM 9.8 06/14/2020 1335   CALCIUM 9.7 06/16/2017 0921   ALKPHOS 61 06/14/2020 1335   ALKPHOS 63 06/16/2017 0921   AST 28 06/14/2020 1335   AST 23 06/16/2017 0921   ALT 19 06/14/2020 1335   ALT 18 06/16/2017 0921   BILITOT 0.6 06/14/2020 1335   BILITOT 0.52 06/16/2017 0921       RADIOGRAPHIC STUDIES: CT Chest W Contrast  Result Date: 06/17/2020 CLINICAL DATA:  History of lymphoma in an 85 year old female, follow-up evaluation. EXAM: CT CHEST, ABDOMEN, AND PELVIS WITH CONTRAST TECHNIQUE: Multidetector CT imaging of the chest, abdomen and pelvis was performed following the standard protocol during bolus  administration of intravenous contrast. CONTRAST:  185mL OMNIPAQUE IOHEXOL 300 MG/ML  SOLN COMPARISON:  June 16, 2019 FINDINGS: CT CHEST FINDINGS Cardiovascular: Dual lead pacer device remains in place aorta is tortuous with calcified and noncalcified atheromatous plaque. No aneurysmal dilation of the thoracic aorta. Heart size remains moderate to markedly enlarged. Small pericardial effusion is unchanged. Central pulmonary vasculature mildly engorged similar to the prior study. Limited assessment on venous phase. Mediastinum/Nodes: Herniation of much of the stomach into the chest. Similar appearance to prior exam with herniation of the colon as well through a large hiatal hernia, herniating into the LEFT chest. Esophagus is normal. No thoracic inlet or axillary lymphadenopathy. No mediastinal lymphadenopathy. Lungs/Pleura: Scattered small pulmonary nodules throughout the chest with similar appearance to the prior exam. LEFT upper lobe pulmonary nodule measuring 5 mm (image 37, series 6) previously 5 mm. Two similar size nodules in the LEFT upper lobe also with calcified nodule in the LEFT chest. Numerous calcified and noncalcified  nodules in the RIGHT chest, largest in the RIGHT upper lobe along the periphery on image 58 of series 6 also 5 mm and unchanged. (Image 61, series 6) 6 mm superior segment RIGHT lower lobe pulmonary nodule without change. No effusion. Scarring in the medial RIGHT lung base. Musculoskeletal: See below for full musculoskeletal detail. CT ABDOMEN PELVIS FINDINGS Hepatobiliary: Stable low-density lesions in the liver compatible with cysts. No biliary duct dilation. No pericholecystic stranding. Pancreas: Normal Spleen: Normal Adrenals/Urinary Tract: Adrenal glands are normal. Symmetric renal enhancement. No hydronephrosis. Urinary bladder is unremarkable. Stomach/Bowel: Large hiatal hernia and herniation of a portion of the transverse colon into the chest. Normal appendix. No small bowel dilation. No acute colonic process colonic diverticulosis. Vascular/Lymphatic: Calcified and noncalcified atheromatous plaque in the abdominal aorta. No aneurysmal dilation. There is no gastrohepatic or hepatoduodenal ligament lymphadenopathy. No retroperitoneal or mesenteric lymphadenopathy. No pelvic sidewall lymphadenopathy. Reproductive: Post hysterectomy.  No sign of adnexal mass. Other: No ascites. Musculoskeletal: Levoconvex rotary scoliotic curvature centered about the thoracolumbar junction and associated degenerative changes of the spine with a similar appearance when compared to the previous imaging evaluation. IMPRESSION: 1. No findings to suggest residual are recurrent lymphoma. 2. Large hiatal hernia and herniation of a portion of the transverse colon into the chest, unchanged. 3. Scattered calcified and noncalcified pulmonary nodules without change. 4. Cardiomegaly with small pericardial effusion, unchanged. 5. Aortic atherosclerosis. Aortic Atherosclerosis (ICD10-I70.0). Electronically Signed   By: Zetta Bills M.D.   On: 06/17/2020 08:18   CT Abdomen Pelvis W Contrast  Result Date: 06/17/2020 CLINICAL DATA:   History of lymphoma in an 85 year old female, follow-up evaluation. EXAM: CT CHEST, ABDOMEN, AND PELVIS WITH CONTRAST TECHNIQUE: Multidetector CT imaging of the chest, abdomen and pelvis was performed following the standard protocol during bolus administration of intravenous contrast. CONTRAST:  1108mL OMNIPAQUE IOHEXOL 300 MG/ML  SOLN COMPARISON:  June 16, 2019 FINDINGS: CT CHEST FINDINGS Cardiovascular: Dual lead pacer device remains in place aorta is tortuous with calcified and noncalcified atheromatous plaque. No aneurysmal dilation of the thoracic aorta. Heart size remains moderate to markedly enlarged. Small pericardial effusion is unchanged. Central pulmonary vasculature mildly engorged similar to the prior study. Limited assessment on venous phase. Mediastinum/Nodes: Herniation of much of the stomach into the chest. Similar appearance to prior exam with herniation of the colon as well through a large hiatal hernia, herniating into the LEFT chest. Esophagus is normal. No thoracic inlet or axillary lymphadenopathy. No mediastinal lymphadenopathy. Lungs/Pleura: Scattered small pulmonary nodules throughout the  chest with similar appearance to the prior exam. LEFT upper lobe pulmonary nodule measuring 5 mm (image 37, series 6) previously 5 mm. Two similar size nodules in the LEFT upper lobe also with calcified nodule in the LEFT chest. Numerous calcified and noncalcified nodules in the RIGHT chest, largest in the RIGHT upper lobe along the periphery on image 58 of series 6 also 5 mm and unchanged. (Image 61, series 6) 6 mm superior segment RIGHT lower lobe pulmonary nodule without change. No effusion. Scarring in the medial RIGHT lung base. Musculoskeletal: See below for full musculoskeletal detail. CT ABDOMEN PELVIS FINDINGS Hepatobiliary: Stable low-density lesions in the liver compatible with cysts. No biliary duct dilation. No pericholecystic stranding. Pancreas: Normal Spleen: Normal Adrenals/Urinary  Tract: Adrenal glands are normal. Symmetric renal enhancement. No hydronephrosis. Urinary bladder is unremarkable. Stomach/Bowel: Large hiatal hernia and herniation of a portion of the transverse colon into the chest. Normal appendix. No small bowel dilation. No acute colonic process colonic diverticulosis. Vascular/Lymphatic: Calcified and noncalcified atheromatous plaque in the abdominal aorta. No aneurysmal dilation. There is no gastrohepatic or hepatoduodenal ligament lymphadenopathy. No retroperitoneal or mesenteric lymphadenopathy. No pelvic sidewall lymphadenopathy. Reproductive: Post hysterectomy.  No sign of adnexal mass. Other: No ascites. Musculoskeletal: Levoconvex rotary scoliotic curvature centered about the thoracolumbar junction and associated degenerative changes of the spine with a similar appearance when compared to the previous imaging evaluation. IMPRESSION: 1. No findings to suggest residual are recurrent lymphoma. 2. Large hiatal hernia and herniation of a portion of the transverse colon into the chest, unchanged. 3. Scattered calcified and noncalcified pulmonary nodules without change. 4. Cardiomegaly with small pericardial effusion, unchanged. 5. Aortic atherosclerosis. Aortic Atherosclerosis (ICD10-I70.0). Electronically Signed   By: Zetta Bills M.D.   On: 06/17/2020 08:18    ASSESSMENT AND PLAN:  This is a very pleasant 85 years old white female with recurrent non-Hodgkin lymphoma, follicular center cell type status post treatment with Rituxan as well as Afinitor at [facility] and also completed a course of maintenance Rituxan for 12 cycles last dose was given 10/01/2014. The patient has been in observation since that time and she is feeling fine today with no concerning complaints. She had repeat CT scan of the chest, abdomen pelvis as well as blood work performed recently.  I personally and independently reviewed the scans and discussed the results with the patient today. Her  scan showed no concerning findings for disease recurrence or metastasis. I recommended for her to continue on observation with repeat CT scan of the chest, abdomen and pelvis in 1 year. She was advised to call immediately if she has any concerning symptoms in the interval. The patient voices understanding of current disease status and treatment options and is in agreement with the current care plan. All questions were answered. The patient knows to call the clinic with any problems, questions or concerns. We can certainly see the patient much sooner if necessary.   Disclaimer: This note was dictated with voice recognition software. Similar sounding words can inadvertently be transcribed and may not be corrected upon review.

## 2020-06-19 ENCOUNTER — Telehealth: Payer: Self-pay | Admitting: Internal Medicine

## 2020-06-19 NOTE — Telephone Encounter (Signed)
Scheduled appts per 12/20 los. Pt confirmed appt date and time.  

## 2020-06-22 ENCOUNTER — Other Ambulatory Visit: Payer: Self-pay | Admitting: Cardiovascular Disease

## 2020-06-23 DIAGNOSIS — D839 Common variable immunodeficiency, unspecified: Secondary | ICD-10-CM | POA: Diagnosis not present

## 2020-06-25 ENCOUNTER — Ambulatory Visit (HOSPITAL_COMMUNITY)
Admission: RE | Admit: 2020-06-25 | Discharge: 2020-06-25 | Disposition: A | Discharge status: Home / Self Care | Payer: Medicare Other | Source: Ambulatory Visit | Attending: Vascular Surgery | Admitting: Vascular Surgery

## 2020-06-25 ENCOUNTER — Ambulatory Visit: Payer: Medicare Other | Admitting: Vascular Surgery

## 2020-06-25 ENCOUNTER — Encounter: Payer: Self-pay | Admitting: Vascular Surgery

## 2020-06-25 ENCOUNTER — Other Ambulatory Visit: Payer: Self-pay

## 2020-06-25 DIAGNOSIS — I739 Peripheral vascular disease, unspecified: Secondary | ICD-10-CM | POA: Diagnosis not present

## 2020-06-25 DIAGNOSIS — I70213 Atherosclerosis of native arteries of extremities with intermittent claudication, bilateral legs: Secondary | ICD-10-CM

## 2020-06-25 DIAGNOSIS — I70219 Atherosclerosis of native arteries of extremities with intermittent claudication, unspecified extremity: Secondary | ICD-10-CM | POA: Insufficient documentation

## 2020-06-25 MED ORDER — ATORVASTATIN CALCIUM 10 MG PO TABS: 40.0000 mg | ORAL_TABLET | Freq: Every day | ORAL | Status: DC

## 2020-06-25 MED ORDER — ASPIRIN EC 81 MG PO TBEC: 81.0000 mg | DELAYED_RELEASE_TABLET | Freq: Every day | ORAL | Status: DC

## 2020-06-25 NOTE — Progress Notes (Signed)
ASSESSMENT & PLAN:  85 y.o. female with bilateral lower extremity intermittent claudication.  Her symptoms are a bit atypical, but she does report reproducible discomfort in her calves when she walks.  She does have evidence of peripheral arterial disease on noninvasive testing.  Recommend the following which can slow the progression of atherosclerosis and reduce the risk of major adverse cardiac / limb events:  Complete cessation from all tobacco products. Blood glucose control with goal A1c < 7%. Blood pressure control with goal blood pressure < 140/90 mmHg. Lipid reduction therapy with goal LDL-C <100 mg/dL (<70 if symptomatic from PAD).  Adequate hydration (at least 2 liters / day) if patient's heart and kidney function is adequate. Aspirin 81mg  PO QD.  Exercise regimen for intermittent claudication. Atorvastatin 40-80mg  PO QD (or other "high intensity" statin therapy).  Patient will need LFTs checked in next several weeks with initiation of statin therapy - will coordinate with PCP.   Return to care in 6 months with repeat ABI.  CHIEF COMPLAINT:   Lower extremity cramping pain.  HISTORY:  HISTORY OF PRESENT ILLNESS: [name] is a 85 y.o. female for the clinic for evaluation of lower extremity discomfort.  She reports reproducible pain in her calves with ambulation.  She can only ambulate to the mailbox before pain starts.  She also reports periodic cramping discomfort in her calves and feet at rest.  She has coolness and discoloration of her toes.  This does not appear related to temperature or seasonal changes.  Reports no symptoms consistent with ischemic rest pain.  She has no symptoms consistent with ischemic ulceration.  Past Medical History:  Diagnosis Date  . Anemia    pt denies  . Arthritis   . Atrial fibrillation ([place])   . Cataract    bil  . Cystocele    history with rectocele, pt denies  . Dysrhythmia   . GERD (gastroesophageal reflux disease)   . H/O  hiatal hernia   . Hemorrhoid   . History of pancreatitis    pt denies and denies as of today 08/09/2017  . Hypertension   . Hypothyroidism   . Non Hodgkin's lymphoma ([place])    dx 2012 and now in remission  . Osteoporosis   . Pacemaker 09/15/2011   Medtronic Revo  . Pericarditis   . Pleural effusion, bilateral    history of  . Pulmonary nodules    history of  . Scoliosis   . Varicose vein     Past Surgical History:  Procedure Laterality Date  . BREAST SURGERY  07/30/01   biopsy x 3, bilateral  . CATARACT EXTRACTION Bilateral   . COLONOSCOPY    . MASS EXCISION Right 10/28/2012   Procedure: Removal of mass on neck       ;  Surgeon: Adin Hector, MD;  Location: [facility];  Service: General;  Laterality: Right;  . PARTIAL HYSTERECTOMY  04/29/2004  . PERMANENT PACEMAKER INSERTION N/A 09/15/2011   Procedure: PERMANENT PACEMAKER INSERTION;  Surgeon: Sanda Klein, MD;  Location: [facility] CATH [facility];  Service: Cardiovascular;  Laterality: N/A;  . RADIOACTIVE SEED GUIDED EXCISIONAL BREAST BIOPSY Left 08/11/2017   Procedure: LEFT RADIOACTIVE SEED GUIDED EXCISIONAL BREAST BIOPSY ERAS PATHWAY;  Surgeon: Stark Klein, MD;  Location: [facility];  Service: General;  Laterality: Left;  . TONSILLECTOMY      Family History  Problem Relation Age of Onset  . Stroke Mother   . Stroke Father   . Heart disease  Daughter        congenital "whole in her heart"  . Lymphoma Daughter        nonhodgkins  . Breast cancer Sister   . Heart disease Brother   . Colon cancer Neg Hx   . Esophageal cancer Neg Hx   . Pancreatic cancer Neg Hx   . Stomach cancer Neg Hx   . Liver disease Neg Hx     Social History   Socioeconomic History  . Marital status: Widowed    Spouse name: Not on file  . Number of children: 3  . Years of education: Not on file  . Highest education level: Not on file  Occupational History  . Occupation: [employer]  Tobacco Use  . Smoking status: Never Smoker  . Smokeless tobacco: Never  Used  Vaping Use  . Vaping Use: Never used  Substance and Sexual Activity  . Alcohol use: Yes    Comment: 1 glass of wine occasionally  . Drug use: No  . Sexual activity: Not on file  Other Topics Concern  . 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  She lives alone in a 3-story home.  There is a lift chair which she does not use (placed for her husband).   Social Determinants of Health   Financial Resource Strain: Not on file  Food Insecurity: Not on file  Transportation Needs: Not on file  Physical Activity: Not on file  Stress: Not on file  Social Connections: Not on file  Intimate Partner Violence: Not on file    Allergies  Allergen Reactions  . Amoxicillin Hives    Has patient had a PCN reaction causing immediate rash, facial/tongue/throat swelling, SOB or lightheadedness with hypotension: No Has patient had a PCN reaction causing severe rash involving mucus membranes or skin necrosis: No Has patient had a PCN reaction that required hospitalization: No Has patient had a PCN reaction occurring within the last 10 years: No If all of the above answers are "NO", then may proceed with Cephalosporin use.   . Buprenorphine Hives  . Codeine Hives    REACTION: "makes her crazy" Hives  . Demerol Hives  . Morphine And Related Hives  . Sulfonamide Derivatives Rash    REACTION: Rash  . Vicodin [Hydrocodone-Acetaminophen] Hives    Current Outpatient Medications  Medication Sig Dispense Refill  . acetaminophen (TYLENOL) 500 MG tablet Take 500 mg by mouth every 6 (six) hours as needed (for pain.).     [name] Calcium Carbonate-Vitamin D 600-400 MG-UNIT tablet Take 1 tablet by mouth 2 (two) times daily.    [name] denosumab (PROLIA) 60 MG/ML SOLN injection Inject 60 mg into the skin every 6 (six) months. Reported on 10/16/2015    . furosemide (LASIX) 20 MG tablet TAKE 1 TABLET BY MOUTH ONCE DAILY 90  tablet 2  . gabapentin (NEURONTIN) 300 MG capsule Take 1 capsule (300 mg total) by mouth 4 (four) times daily. 120 capsule 4  . ibuprofen (ADVIL,MOTRIN) 200 MG tablet Take 200-400 mg by mouth every 6 (six) hours as needed (for pain.).    [name] levothyroxine (SYNTHROID) 100 MCG tablet Take 1 tablet (100 mcg total) by mouth daily. 90 tablet 3  . lisinopril (ZESTRIL) 20 MG tablet TAKE 1 TABLET(20 MG) BY MOUTH TWICE DAILY 180 tablet 3  . Magnesium Oxide (MAG-OX 400 PO) Take 1 tablet by mouth daily.    . metoprolol tartrate (  LOPRESSOR) 25 MG tablet TAKE 1 TABLET(25 MG) BY MOUTH DAILY. MAY TAKE 1/2 TABLET EXTRA IF SYSTOLIC BLOOD PRESSURE IS ABOVE 160 90 tablet 3  . olopatadine (PATANOL) 0.1 % ophthalmic solution Place 1 drop into both eyes 2 times daily. 5 mL 1  . omeprazole (PRILOSEC) 40 MG capsule TAKE 1 CAPSULE(40 MG) BY MOUTH DAILY 90 capsule 3  . potassium chloride (KLOR-CON) 10 MEQ tablet TAKE 1 TABLET(10 MEQ) BY MOUTH DAILY 90 tablet 0   No current facility-administered medications for this visit.    REVIEW OF SYSTEMS:  [X]  denotes positive finding, [ ]  denotes negative finding Cardiac  Comments:  Chest pain or chest pressure:    Shortness of breath upon exertion:    Short of breath when lying flat:    Irregular heart rhythm:        Vascular    Pain in calf, thigh, or hip brought on by ambulation: x   Pain in feet at night that wakes you up from your sleep:  x   Blood clot in your veins:    Leg swelling:         Pulmonary    Oxygen at home:    Productive cough:     Wheezing:         Neurologic    Sudden weakness in arms or legs:     Sudden numbness in arms or legs:     Sudden onset of difficulty speaking or slurred speech:    Temporary loss of vision in one eye:     Problems with dizziness:         Gastrointestinal    Blood in stool:     Vomited blood:         Genitourinary    Burning when urinating:     Blood in urine:        Psychiatric    Major depression:          Hematologic    Bleeding problems:    Problems with blood clotting too easily:        Skin    Rashes or ulcers:        Constitutional    Fever or chills:     PHYSICAL EXAM:   Vitals:   06/25/20 1046  BP: 133/82  Pulse: 69  Resp: 20  Temp: 97.8 F (36.6 C)  SpO2: 97%  Weight: 138 lb (62.6 kg)  Height: 5\' 2"  (1.575 m)   Constitutional: Well appearing in no distress. Appears well nourished.  Neurologic: Normal gait and station. CN intact. No weakness. No sensory loss. Psychiatric: Mood and affect symmetric and appropriate. Eyes: No icterus. No conjunctival pallor. Ears, nose, throat: mucous membranes moist. Midline trachea.  Cardiac: regular rate and rhythm.  Respiratory: unlabored. Abdominal: soft, non-tender, non-distended. No palpable pulsatile abdominal mass. Peripheral vascular:  Non-palpable pedal pulses Extremity: No edema. No cyanosis. No pallor.  Skin: No gangrene. No ulceration.  Lymphatic: No Stemmer's sign. No palpable lymphadenopathy.   DATA REVIEW:    Most recent CBC CBC Latest Ref Rng & Units 06/14/2020 05/08/2020 06/16/2019  WBC 4.0 - 10.5 K/uL 6.6 5.7 9.5  Hemoglobin 12.0 - 15.0 g/dL 06/16/2020 13/03/2020 06/18/2019  Hematocrit 36.0 - 46.0 % 40.6 40.0 42.6  Platelets 150 - 400 K/uL 239 230.0 255     Most recent CMP CMP Latest Ref Rng & Units 06/14/2020 05/08/2020 06/16/2019  Glucose 70 - 99 mg/dL 86 06/16/2020) 83  BUN 8 - 23 mg/dL 12 15 22  Creatinine 0.44 - 1.00 mg/dL 0.80 0.72 0.85  Sodium 135 - 145 mmol/L 140 141 139  Potassium 3.5 - 5.1 mmol/L 4.2 4.2 4.0  Chloride 98 - 111 mmol/L 107 106 102  CO2 22 - 32 mmol/L 26 30 28   Calcium 8.9 - 10.3 mg/dL 9.8 9.5 9.7  Total Protein 6.5 - 8.1 g/dL 7.2 6.7 7.9  Total Bilirubin 0.3 - 1.2 mg/dL 0.6 0.4 0.6  Alkaline Phos 38 - 126 U/L 61 56 96  AST 15 - 41 U/L 28 25 29   ALT 0 - 44 U/L 19 14 20     Renal function Estimated Creatinine Clearance: 46.3 mL/min (by C-G formula based on SCr of 0.8 mg/dL).  No results found  for: HGBA1C  LDL Calculated  Date Value Ref Range Status  11/17/2018 74 0 - 99 mg/dL Final   LDL Cholesterol  Date Value Ref Range Status  05/08/2020 90 0 - 99 mg/dL Final     ABI 06/25/20   Yevonne Aline. Stanford Breed, MD [facility] Phone Number: [phone] 06/25/2020 11:58 AM

## 2020-06-26 ENCOUNTER — Other Ambulatory Visit: Payer: Self-pay

## 2020-06-26 DIAGNOSIS — I70213 Atherosclerosis of native arteries of extremities with intermittent claudication, bilateral legs: Secondary | ICD-10-CM

## 2020-06-30 DIAGNOSIS — D839 Common variable immunodeficiency, unspecified: Secondary | ICD-10-CM | POA: Diagnosis not present

## 2020-07-07 DIAGNOSIS — D839 Common variable immunodeficiency, unspecified: Secondary | ICD-10-CM | POA: Diagnosis not present

## 2020-07-11 ENCOUNTER — Other Ambulatory Visit: Payer: Self-pay | Admitting: Obstetrics and Gynecology

## 2020-07-11 DIAGNOSIS — N63 Unspecified lump in unspecified breast: Secondary | ICD-10-CM

## 2020-07-11 DIAGNOSIS — N6320 Unspecified lump in the left breast, unspecified quadrant: Secondary | ICD-10-CM | POA: Diagnosis not present

## 2020-07-14 DIAGNOSIS — D839 Common variable immunodeficiency, unspecified: Secondary | ICD-10-CM | POA: Diagnosis not present

## 2020-07-31 ENCOUNTER — Telehealth: Payer: Self-pay | Admitting: Family

## 2020-07-31 ENCOUNTER — Other Ambulatory Visit: Payer: Self-pay

## 2020-07-31 NOTE — Telephone Encounter (Signed)
Pt states that her refill for synthroid was not filled, her med lists states she is on   levothyroxine (SYNTHROID) 100 MCG tablet  But pt states she was changed to 112MCG after her last blood work  [id number] - [address], [place] AT [place] [place] Phone:  [phone]  Fax:  [phone]     Please send over refill and contact the patient

## 2020-08-01 ENCOUNTER — Ambulatory Visit (INDEPENDENT_AMBULATORY_CARE_PROVIDER_SITE_OTHER): Payer: Medicare Other

## 2020-08-01 ENCOUNTER — Other Ambulatory Visit: Payer: Self-pay

## 2020-08-01 DIAGNOSIS — M810 Age-related osteoporosis without current pathological fracture: Secondary | ICD-10-CM | POA: Diagnosis not present

## 2020-08-01 MED ORDER — DENOSUMAB 60 MG/ML ~~LOC~~ SOSY
60.0000 mg | PREFILLED_SYRINGE | Freq: Once | SUBCUTANEOUS | Status: AC
  Administered 2020-08-01: 60 mg via SUBCUTANEOUS

## 2020-08-01 NOTE — Progress Notes (Signed)
Pt here for Prolia injection per Jodi Mourning, FNP  Prolia 60mg  given subcutaneous in left arm and pt tolerated injection well.  Dr Quay Burow: can you please cosign since PCP is out of office?

## 2020-08-02 NOTE — Telephone Encounter (Signed)
Pt notified of PCP response; explained that she has enough refills to last until July 2022 of the 121mcg.  Pt verb understanding.  Instructed to call back if any questions/concerns.

## 2020-08-02 NOTE — Telephone Encounter (Signed)
She is supposed to be on the 100 mcg; it was lowered from 112 to 100 in July 2021; we checked the level in November 2021 and it was stable so current dosage was continued. The 190mcg dosage was on her most recent medication list when she was seen at her last OV in November 2021, and we discussed it at that time also.

## 2020-08-14 ENCOUNTER — Ambulatory Visit (INDEPENDENT_AMBULATORY_CARE_PROVIDER_SITE_OTHER): Payer: Medicare Other

## 2020-08-14 DIAGNOSIS — R001 Bradycardia, unspecified: Secondary | ICD-10-CM | POA: Diagnosis not present

## 2020-08-14 DIAGNOSIS — C8590 Non-Hodgkin lymphoma, unspecified, unspecified site: Secondary | ICD-10-CM | POA: Diagnosis not present

## 2020-08-14 DIAGNOSIS — L659 Nonscarring hair loss, unspecified: Secondary | ICD-10-CM | POA: Diagnosis not present

## 2020-08-14 DIAGNOSIS — D839 Common variable immunodeficiency, unspecified: Secondary | ICD-10-CM | POA: Diagnosis not present

## 2020-08-15 LAB — CUP PACEART REMOTE DEVICE CHECK
Battery Voltage: 2.88 V
Brady Statistic RA Percent Paced: 98.23 %
Date Time Interrogation Session: 20220217115238
Implantable Lead Implant Date: 20130319
Implantable Lead Implant Date: 20130319
Implantable Lead Location: 753859
Implantable Lead Location: 753860
Implantable Pulse Generator Implant Date: 20130319
Lead Channel Impedance Value: 368 Ohm
Lead Channel Impedance Value: 464 Ohm
Lead Channel Sensing Intrinsic Amplitude: 0.931 mV
Lead Channel Sensing Intrinsic Amplitude: 15.009 mV
Lead Channel Setting Pacing Amplitude: 2 V
Lead Channel Setting Sensing Sensitivity: 0.9 mV

## 2020-08-20 NOTE — Progress Notes (Signed)
Remote pacemaker transmission.   

## 2020-08-21 ENCOUNTER — Other Ambulatory Visit: Payer: Self-pay | Admitting: Obstetrics and Gynecology

## 2020-08-21 ENCOUNTER — Ambulatory Visit
Admission: RE | Admit: 2020-08-21 | Discharge: 2020-08-21 | Disposition: A | Discharge status: Home / Self Care | Payer: Medicare Other | Source: Ambulatory Visit | Attending: Obstetrics and Gynecology | Admitting: Obstetrics and Gynecology

## 2020-08-21 ENCOUNTER — Other Ambulatory Visit: Payer: Self-pay

## 2020-08-21 DIAGNOSIS — N6002 Solitary cyst of left breast: Secondary | ICD-10-CM | POA: Diagnosis not present

## 2020-08-21 DIAGNOSIS — N63 Unspecified lump in unspecified breast: Secondary | ICD-10-CM

## 2020-08-21 DIAGNOSIS — R922 Inconclusive mammogram: Secondary | ICD-10-CM | POA: Diagnosis not present

## 2020-08-26 ENCOUNTER — Telehealth: Payer: Self-pay | Admitting: Medical Oncology

## 2020-08-26 NOTE — Telephone Encounter (Signed)
Mammogram concerns-Wants a second opinion from Dr. Julien Nordmann about her mammogram and "lump in breast". Pt indicated the area would be monitored.

## 2020-08-26 NOTE — Telephone Encounter (Signed)
I agree with the close monitoring.  Thank you.

## 2020-08-27 NOTE — Telephone Encounter (Signed)
Pt notified . She has f/u with obgyn in 6 months.

## 2020-10-02 ENCOUNTER — Other Ambulatory Visit: Payer: Self-pay

## 2020-10-02 ENCOUNTER — Encounter: Payer: Self-pay | Admitting: Internal Medicine

## 2020-10-02 ENCOUNTER — Ambulatory Visit: Payer: Medicare Other

## 2020-10-02 ENCOUNTER — Ambulatory Visit (INDEPENDENT_AMBULATORY_CARE_PROVIDER_SITE_OTHER): Payer: Medicare Other | Admitting: Internal Medicine

## 2020-10-02 VITALS — BP 116/60 | HR 66 | Temp 98.1°F | Ht 62.0 in | Wt 135.4 lb

## 2020-10-02 DIAGNOSIS — G6289 Other specified polyneuropathies: Secondary | ICD-10-CM | POA: Diagnosis not present

## 2020-10-02 DIAGNOSIS — I10 Essential (primary) hypertension: Secondary | ICD-10-CM | POA: Diagnosis not present

## 2020-10-02 NOTE — Patient Instructions (Signed)
Bradley and Daroff's neurology in clinical practice (8th ed., pp. 1853- 1929). Elsevier."> Goldman-Cecil medicine (26th ed., pp. 2489- 2501). Elsevier.">  Peripheral Neuropathy Peripheral neuropathy is a type of nerve damage. It affects nerves that carry signals between the spinal cord and the arms, legs, and the rest of the body (peripheral nerves). It does not affect nerves in the spinal cord or brain. In peripheral neuropathy, one nerve or a group of nerves may be damaged. Peripheral neuropathy is a broad category that includes many specific nerve disorders, like diabetic neuropathy, hereditary neuropathy, and carpal tunnel syndrome. What are the causes? This condition may be caused by:  Diabetes. This is the most common cause of peripheral neuropathy.  Nerve injury.  Pressure or stress on a nerve that lasts a long time.  Lack (deficiency) of B vitamins. This can result from alcoholism, poor diet, or a restricted diet.  Infections.  Autoimmune diseases, such as rheumatoid arthritis and systemic lupus erythematosus.  Nerve diseases that are passed from parent to child (inherited).  Some medicines, such as cancer medicines (chemotherapy).  Poisonous (toxic) substances, such as lead and mercury.  Too little blood flowing to the legs.  Kidney disease.  Thyroid disease. In some cases, the cause of this condition is not known. What are the signs or symptoms? Symptoms of this condition depend on which of your nerves is damaged. Common symptoms include:  Loss of feeling (numbness) in the feet, hands, or both.  Tingling in the feet, hands, or both.  Burning pain.  Very sensitive skin.  Weakness.  Not being able to move a part of the body (paralysis).  Muscle twitching.  Clumsiness or poor coordination.  Loss of balance.  Not being able to control your bladder.  Feeling dizzy.  Sexual problems. How is this diagnosed? Diagnosing and finding the cause of peripheral  neuropathy can be difficult. Your health care provider will take your medical history and do a physical exam. A neurological exam will also be done. This involves checking things that are affected by your brain, spinal cord, and nerves (nervous system). For example, your health care provider will check your reflexes, how you move, and what you can feel. You may have other tests, such as:  Blood tests.  Electromyogram (EMG) and nerve conduction tests. These tests check nerve function and how well the nerves are controlling the muscles.  Imaging tests, such as CT scans or MRI to rule out other causes of your symptoms.  Removing a small piece of nerve to be examined in a lab (nerve biopsy).  Removing and examining a small amount of the fluid that surrounds the brain and spinal cord (lumbar puncture). How is this treated? Treatment for this condition may involve:  Treating the underlying cause of the neuropathy, such as diabetes, kidney disease, or vitamin deficiencies.  Stopping medicines that can cause neuropathy, such as chemotherapy.  Medicine to help relieve pain. Medicines may include: ? Prescription or over-the-counter pain medicine. ? Antiseizure medicine. ? Antidepressants. ? Pain-relieving patches that are applied to painful areas of skin.  Surgery to relieve pressure on a nerve or to destroy a nerve that is causing pain.  Physical therapy to help improve movement and balance.  Devices to help you move around (assistive devices). Follow these instructions at home: Medicines  Take over-the-counter and prescription medicines only as told by your health care provider. Do not take any other medicines without first asking your health care provider.  Do not drive or use heavy   machinery while taking prescription pain medicine. Lifestyle  Do not use any products that contain nicotine or tobacco, such as cigarettes and e-cigarettes. Smoking keeps blood from reaching damaged nerves.  If you need help quitting, ask your health care provider.  Avoid or limit alcohol. Too much alcohol can cause a vitamin B deficiency, and vitamin B is needed for healthy nerves.  Eat a healthy diet. This includes: ? Eating foods that are high in fiber, such as fresh fruits and vegetables, whole grains, and beans. ? Limiting foods that are high in fat and processed sugars, such as fried or sweet foods.   General instructions  If you have diabetes, work closely with your health care provider to keep your blood sugar under control.  If you have numbness in your feet: ? Check every day for signs of injury or infection. Watch for redness, warmth, and swelling. ? Wear padded socks and comfortable shoes. These help protect your feet.  Develop a good support system. Living with peripheral neuropathy can be stressful. Consider talking with a mental health specialist or joining a support group.  Use assistive devices and attend physical therapy as told by your health care provider. This may include using a walker or a cane.  Keep all follow-up visits as told by your health care provider. This is important.   Contact a health care provider if:  You have new signs or symptoms of peripheral neuropathy.  You are struggling emotionally from dealing with peripheral neuropathy.  Your pain is not well-controlled. Get help right away if:  You have an injury or infection that is not healing normally.  You develop new weakness in an arm or leg.  You have fallen or do so frequently. Summary  Peripheral neuropathy is when the nerves in the arms, or legs are damaged, resulting in numbness, weakness, or pain.  There are many causes of peripheral neuropathy, including diabetes, pinched nerves, vitamin deficiencies, autoimmune disease, and hereditary conditions.  Diagnosing and finding the cause of peripheral neuropathy can be difficult. Your health care provider will take your medical history, do a  physical exam, and do tests, including blood tests and nerve function tests.  Treatment involves treating the underlying cause of the neuropathy and taking medicines to help control pain. Physical therapy and assistive devices may also help. This information is not intended to replace advice given to you by your health care provider. Make sure you discuss any questions you have with your health care provider. Document Revised: 03/26/2020 Document Reviewed: 03/26/2020 Elsevier Patient Education  2021 Elsevier Inc.  

## 2020-10-02 NOTE — Progress Notes (Signed)
Subjective:  Patient ID: [name], female    DOB: [birth year]  Age: 86 y.o. MRN: [id number]  CC: Back Pain, Hypertension, and Osteoarthritis  This visit occurred during the SARS-CoV-2 public health emergency.  Safety protocols were in place, including screening questions prior to the visit, additional usage of staff PPE, and extensive cleaning of exam room while observing appropriate contact time as indicated for disinfecting solutions.    HPI TRINADEE Foster presents for f/up and to establish.  She has been treated by a podiatrist for neuropathy in her feet (pain, numbness, tingling) for one year. She has chronic LBP and tramadol was prescribed but it made her sleepy so she does not take it. She gets some sx relief with gabapentin and apap. She sees a back specialist and ESIs were recommended but she has not been willing to do that. She has never seen a neurologist or had an electrical study.  Outpatient Medications Prior to Visit  Medication Sig Dispense Refill  . acetaminophen (TYLENOL) 500 MG tablet Take 500 mg by mouth every 6 (six) hours as needed (for pain.).     [name] Calcium Carbonate-Vitamin D 600-400 MG-UNIT tablet Take 1 tablet by mouth 2 (two) times daily.    [name] denosumab (PROLIA) 60 MG/ML SOLN injection Inject 60 mg into the skin every 6 (six) months. Reported on 10/16/2015    . gabapentin (NEURONTIN) 300 MG capsule Take 1 capsule (300 mg total) by mouth 4 (four) times daily. 120 capsule 4  . ibuprofen (ADVIL,MOTRIN) 200 MG tablet Take 200-400 mg by mouth every 6 (six) hours as needed (for pain.).    [name] levothyroxine (SYNTHROID) 100 MCG tablet Take 1 tablet (100 mcg total) by mouth daily. 90 tablet 3  . lisinopril (ZESTRIL) 20 MG tablet TAKE 1 TABLET(20 MG) BY MOUTH TWICE DAILY 180 tablet 3  . Magnesium Oxide (MAG-OX 400 PO) Take 1 tablet by mouth daily.    . metoprolol tartrate (LOPRESSOR) 25 MG tablet TAKE 1 TABLET(25 MG) BY MOUTH DAILY. MAY TAKE 1/2 TABLET EXTRA IF  SYSTOLIC BLOOD PRESSURE IS ABOVE 160 90 tablet 3  . olopatadine (PATANOL) 0.1 % ophthalmic solution Place 1 drop into both eyes 2 times daily. 5 mL 1  . omeprazole (PRILOSEC) 40 MG capsule TAKE 1 CAPSULE(40 MG) BY MOUTH DAILY 90 capsule 3  . potassium chloride (KLOR-CON) 10 MEQ tablet TAKE 1 TABLET(10 MEQ) BY MOUTH DAILY 90 tablet 0  . furosemide (LASIX) 20 MG tablet TAKE 1 TABLET BY MOUTH ONCE DAILY (Patient not taking: Reported on 10/02/2020) 90 tablet 2   Facility-Administered Medications Prior to Visit  Medication Dose Route Frequency Provider Last Rate Last Admin  . aspirin EC tablet 81 mg  81 mg Oral Daily Cherre Robins, MD      . atorvastatin (LIPITOR) tablet 40 mg  40 mg Oral Daily Cherre Robins, MD        ROS Review of Systems  Constitutional: Negative for diaphoresis, fatigue and fever.  HENT: Negative.   Eyes: Negative.   Respiratory: Negative for cough, chest tightness, shortness of breath and wheezing.   Cardiovascular: Negative for chest pain, palpitations and leg swelling.  Gastrointestinal: Negative for abdominal pain, constipation, diarrhea, nausea and vomiting.  Endocrine: Negative.   Genitourinary: Negative.  Negative for difficulty urinating.  Musculoskeletal: Positive for arthralgias and back pain. Negative for myalgias and neck pain.  Skin: Negative.   Neurological: Positive for numbness. Negative for dizziness and weakness.  Hematological: Negative for adenopathy.  Does not bruise/bleed easily.  Psychiatric/Behavioral: Negative.     Objective:  BP 116/60 (BP Location: Right Arm, Patient Position: Sitting, Cuff Size: Normal)   Pulse 66   Temp 98.1 F (36.7 C) (Oral)   Ht 5\' 2"  (1.575 m)   Wt 135 lb 6.4 oz (61.4 kg)   SpO2 99%   BMI 24.76 kg/m   BP Readings from Last 3 Encounters:  10/02/20 116/60  06/25/20 133/82  06/17/20 132/84    Wt Readings from Last 3 Encounters:  10/02/20 135 lb 6.4 oz (61.4 kg)  06/25/20 138 lb (62.6 kg)  06/17/20 137  lb 1.6 oz (62.2 kg)    Physical Exam Vitals reviewed.  HENT:     Nose: Nose normal.     Mouth/Throat:     Mouth: Mucous membranes are moist.  Eyes:     General: No scleral icterus.    Conjunctiva/sclera: Conjunctivae normal.  Cardiovascular:     Rate and Rhythm: Normal rate and regular rhythm.     Heart sounds: No murmur heard.   Pulmonary:     Effort: Pulmonary effort is normal.     Breath sounds: No stridor. No wheezing, rhonchi or rales.  Abdominal:     General: Abdomen is flat. Bowel sounds are normal. There is no distension.     Palpations: Abdomen is soft. There is no hepatomegaly, splenomegaly or mass.  Musculoskeletal:        General: Normal range of motion.     Cervical back: Neck supple.     Right lower leg: No edema.     Left lower leg: No edema.  Lymphadenopathy:     Cervical: No cervical adenopathy.  Skin:    General: Skin is warm and dry.  Neurological:     General: No focal deficit present.     Mental Status: She is alert and oriented to person, place, and time.     Sensory: Sensation is intact.     Motor: Motor function is intact.     Coordination: Coordination is intact.     Deep Tendon Reflexes: Reflexes normal.     Reflex Scores:      Brachioradialis reflexes are 0 on the right side and 0 on the left side.      Patellar reflexes are 0 on the right side and 0 on the left side.      Achilles reflexes are 0 on the right side and 0 on the left side.    Lab Results  Component Value Date   WBC 6.6 06/14/2020   HGB 13.4 06/14/2020   HCT 40.6 06/14/2020   PLT 239 06/14/2020   GLUCOSE 86 06/14/2020   CHOL 168 05/08/2020   TRIG 178.0 (H) 05/08/2020   HDL 42.50 05/08/2020   LDLCALC 90 05/08/2020   ALT 19 06/14/2020   AST 28 06/14/2020   NA 140 06/14/2020   K 4.2 06/14/2020   CL 107 06/14/2020   CREATININE 0.80 06/14/2020   BUN 12 06/14/2020   CO2 26 06/14/2020   TSH 0.74 05/08/2020    US BREAST LTD UNI LEFT INC AXILLA  Result Date:  08/21/2020 CLINICAL DATA:  Patient complains of a palpable abnormality in the left breast. History of benign excisional biopsy of the left breast in 2019. EXAM: DIGITAL DIAGNOSTIC UNILATERAL LEFT MAMMOGRAM WITH TOMOSYNTHESIS AND CAD; ULTRASOUND LEFT BREAST LIMITED TECHNIQUE: Left digital diagnostic mammography and breast tomosynthesis was performed. The images were evaluated with computer-aided detection.; Targeted ultrasound examination of the left breast  was performed COMPARISON:  Previous exam(s). ACR Breast Density Category c: The breast tissue is heterogeneously dense, which may obscure small masses. FINDINGS: No suspicious mass, malignant type microcalcifications or distortion detected in the left breast. On physical exam, I palpate a superficial mass in the left breast at 4 o'clock 6 cm from the nipple. Targeted ultrasound is performed, showing a well-circumscribed anechoic mass in the left breast at 4 o'clock 6 cm from the nipple measuring 5 x 3 x 5 mm. This is likely a benign cyst or oil cyst. IMPRESSION: Probable benign cyst in the 4 o'clock region of the left breast. RECOMMENDATION: Short-term interval follow-up left breast ultrasound 6 months is recommended. I have discussed the findings and recommendations with the patient. If applicable, a reminder letter will be sent to the patient regarding the next appointment. BI-RADS CATEGORY  3: Probably benign. Electronically Signed   By: Lillia Mountain M.D.   On: 08/21/2020 11:19   MM DIAG BREAST TOMO UNI LEFT  Result Date: 08/21/2020 CLINICAL DATA:  Patient complains of a palpable abnormality in the left breast. History of benign excisional biopsy of the left breast in 2019. EXAM: DIGITAL DIAGNOSTIC UNILATERAL LEFT MAMMOGRAM WITH TOMOSYNTHESIS AND CAD; ULTRASOUND LEFT BREAST LIMITED TECHNIQUE: Left digital diagnostic mammography and breast tomosynthesis was performed. The images were evaluated with computer-aided detection.; Targeted ultrasound examination of  the left breast was performed COMPARISON:  Previous exam(s). ACR Breast Density Category c: The breast tissue is heterogeneously dense, which may obscure small masses. FINDINGS: No suspicious mass, malignant type microcalcifications or distortion detected in the left breast. On physical exam, I palpate a superficial mass in the left breast at 4 o'clock 6 cm from the nipple. Targeted ultrasound is performed, showing a well-circumscribed anechoic mass in the left breast at 4 o'clock 6 cm from the nipple measuring 5 x 3 x 5 mm. This is likely a benign cyst or oil cyst. IMPRESSION: Probable benign cyst in the 4 o'clock region of the left breast. RECOMMENDATION: Short-term interval follow-up left breast ultrasound 6 months is recommended. I have discussed the findings and recommendations with the patient. If applicable, a reminder letter will be sent to the patient regarding the next appointment. BI-RADS CATEGORY  3: Probably benign. Electronically Signed   By: Lillia Mountain M.D.   On: 08/21/2020 11:19    Assessment & Plan:   Susie was seen today for back pain, hypertension and osteoarthritis.  Diagnoses and all orders for this visit:  Essential hypertension- Her BP is well controlled.  Other polyneuropathy- I recommended that she see neurology to consider diagnostic and treatment options. -     Ambulatory referral to Neurology   I am having [name] maintain her acetaminophen, denosumab, Calcium Carbonate-Vitamin D, ibuprofen, Magnesium Oxide (MAG-OX 400 PO), lisinopril, metoprolol tartrate, levothyroxine, omeprazole, olopatadine, gabapentin, furosemide, and potassium chloride. We will continue to administer atorvastatin and aspirin EC.  No orders of the defined types were placed in this encounter.    Follow-up: Return in about 6 months (around 04/03/2021).  Scarlette Calico, MD

## 2020-10-03 ENCOUNTER — Encounter: Payer: Self-pay | Admitting: Neurology

## 2020-10-09 ENCOUNTER — Other Ambulatory Visit: Payer: Self-pay | Admitting: Internal Medicine

## 2020-10-09 ENCOUNTER — Other Ambulatory Visit: Payer: Self-pay | Admitting: Physician Assistant

## 2020-10-09 ENCOUNTER — Telehealth: Payer: Self-pay | Admitting: Family

## 2020-10-09 DIAGNOSIS — E039 Hypothyroidism, unspecified: Secondary | ICD-10-CM

## 2020-10-09 DIAGNOSIS — I10 Essential (primary) hypertension: Secondary | ICD-10-CM

## 2020-10-09 DIAGNOSIS — G6289 Other specified polyneuropathies: Secondary | ICD-10-CM

## 2020-10-09 MED ORDER — LEVOTHYROXINE SODIUM 100 MCG PO TABS: 100.0000 ug | ORAL_TABLET | Freq: Every day | ORAL | 1 refills | Status: DC

## 2020-10-09 MED ORDER — LISINOPRIL 20 MG PO TABS: 20.0000 mg | ORAL_TABLET | Freq: Two times a day (BID) | ORAL | 1 refills | Status: DC

## 2020-10-09 MED ORDER — GABAPENTIN 300 MG PO CAPS: 300.0000 mg | ORAL_CAPSULE | Freq: Four times a day (QID) | ORAL | 1 refills | Status: DC

## 2020-10-09 NOTE — Telephone Encounter (Signed)
1.Medication Requested: lisinopril (ZESTRIL) 20 MG tablet  levothyroxine (SYNTHROID) 100 MCG tablet  gabapentin (NEURONTIN) 300 MG capsule    2. Pharmacy (Name, [place]): [facility], [place] AT [place]  3. On Med List: yes   4. Last Visit with PCP: 10-02-20  5. Next visit date with PCP: n/a   Patient said that she is out of medications    Agent: Please be advised that RX refills may take up to 3 business days. We ask that you follow-up with your pharmacy.

## 2020-10-27 DIAGNOSIS — D839 Common variable immunodeficiency, unspecified: Secondary | ICD-10-CM | POA: Diagnosis not present

## 2020-11-03 DIAGNOSIS — D839 Common variable immunodeficiency, unspecified: Secondary | ICD-10-CM | POA: Diagnosis not present

## 2020-11-10 DIAGNOSIS — D839 Common variable immunodeficiency, unspecified: Secondary | ICD-10-CM | POA: Diagnosis not present

## 2020-11-13 ENCOUNTER — Ambulatory Visit (INDEPENDENT_AMBULATORY_CARE_PROVIDER_SITE_OTHER): Payer: Medicare Other

## 2020-11-13 DIAGNOSIS — I495 Sick sinus syndrome: Secondary | ICD-10-CM

## 2020-11-17 DIAGNOSIS — D839 Common variable immunodeficiency, unspecified: Secondary | ICD-10-CM | POA: Diagnosis not present

## 2020-11-18 LAB — CUP PACEART REMOTE DEVICE CHECK
Battery Voltage: 2.86 V
Brady Statistic RA Percent Paced: 98.52 %
Date Time Interrogation Session: 20220520121158
Implantable Lead Implant Date: 20130319
Implantable Lead Implant Date: 20130319
Implantable Lead Location: 753859
Implantable Lead Location: 753860
Implantable Pulse Generator Implant Date: 20130319
Lead Channel Impedance Value: 392 Ohm
Lead Channel Impedance Value: 472 Ohm
Lead Channel Sensing Intrinsic Amplitude: 1.419 mV
Lead Channel Sensing Intrinsic Amplitude: 15.35 mV
Lead Channel Setting Pacing Amplitude: 2 V
Lead Channel Setting Sensing Sensitivity: 0.9 mV

## 2020-11-25 DIAGNOSIS — D839 Common variable immunodeficiency, unspecified: Secondary | ICD-10-CM | POA: Diagnosis not present

## 2020-12-02 DIAGNOSIS — D839 Common variable immunodeficiency, unspecified: Secondary | ICD-10-CM | POA: Diagnosis not present

## 2020-12-05 NOTE — Progress Notes (Signed)
Remote pacemaker transmission.   

## 2020-12-09 DIAGNOSIS — D839 Common variable immunodeficiency, unspecified: Secondary | ICD-10-CM | POA: Diagnosis not present

## 2020-12-10 ENCOUNTER — Other Ambulatory Visit: Payer: Self-pay

## 2020-12-10 ENCOUNTER — Inpatient Hospital Stay (HOSPITAL_COMMUNITY): Payer: Medicare Other | Admitting: Anesthesiology

## 2020-12-10 ENCOUNTER — Inpatient Hospital Stay (HOSPITAL_BASED_OUTPATIENT_CLINIC_OR_DEPARTMENT_OTHER)
Admission: EM | Admit: 2020-12-10 | Discharge: 2020-12-19 | DRG: 389 | Disposition: A | Discharge status: Home Health | Payer: Medicare Other | Attending: Internal Medicine | Admitting: Internal Medicine

## 2020-12-10 ENCOUNTER — Emergency Department (HOSPITAL_BASED_OUTPATIENT_CLINIC_OR_DEPARTMENT_OTHER): Payer: Medicare Other

## 2020-12-10 ENCOUNTER — Inpatient Hospital Stay (HOSPITAL_COMMUNITY): Payer: Medicare Other

## 2020-12-10 ENCOUNTER — Encounter (HOSPITAL_COMMUNITY)
Admission: EM | Disposition: A | Discharge status: Home Health | Payer: Self-pay | Source: Home / Self Care | Attending: Internal Medicine

## 2020-12-10 ENCOUNTER — Encounter (HOSPITAL_BASED_OUTPATIENT_CLINIC_OR_DEPARTMENT_OTHER): Payer: Self-pay | Admitting: Radiology

## 2020-12-10 DIAGNOSIS — R197 Diarrhea, unspecified: Secondary | ICD-10-CM

## 2020-12-10 DIAGNOSIS — Z8249 Family history of ischemic heart disease and other diseases of the circulatory system: Secondary | ICD-10-CM

## 2020-12-10 DIAGNOSIS — Q399 Congenital malformation of esophagus, unspecified: Secondary | ICD-10-CM | POA: Diagnosis not present

## 2020-12-10 DIAGNOSIS — K5730 Diverticulosis of large intestine without perforation or abscess without bleeding: Secondary | ICD-10-CM | POA: Diagnosis not present

## 2020-12-10 DIAGNOSIS — J42 Unspecified chronic bronchitis: Secondary | ICD-10-CM | POA: Diagnosis not present

## 2020-12-10 DIAGNOSIS — I471 Supraventricular tachycardia: Secondary | ICD-10-CM | POA: Diagnosis not present

## 2020-12-10 DIAGNOSIS — K449 Diaphragmatic hernia without obstruction or gangrene: Secondary | ICD-10-CM | POA: Diagnosis present

## 2020-12-10 DIAGNOSIS — A044 Other intestinal Escherichia coli infections: Secondary | ICD-10-CM | POA: Diagnosis present

## 2020-12-10 DIAGNOSIS — I739 Peripheral vascular disease, unspecified: Secondary | ICD-10-CM | POA: Diagnosis present

## 2020-12-10 DIAGNOSIS — Z807 Family history of other malignant neoplasms of lymphoid, hematopoietic and related tissues: Secondary | ICD-10-CM

## 2020-12-10 DIAGNOSIS — Z950 Presence of cardiac pacemaker: Secondary | ICD-10-CM

## 2020-12-10 DIAGNOSIS — I313 Pericardial effusion (noninflammatory): Secondary | ICD-10-CM | POA: Diagnosis present

## 2020-12-10 DIAGNOSIS — C8221 Follicular lymphoma grade III, unspecified, lymph nodes of head, face, and neck: Secondary | ICD-10-CM | POA: Diagnosis not present

## 2020-12-10 DIAGNOSIS — I5032 Chronic diastolic (congestive) heart failure: Secondary | ICD-10-CM | POA: Diagnosis present

## 2020-12-10 DIAGNOSIS — Z20822 Contact with and (suspected) exposure to covid-19: Secondary | ICD-10-CM | POA: Diagnosis present

## 2020-12-10 DIAGNOSIS — Z882 Allergy status to sulfonamides status: Secondary | ICD-10-CM

## 2020-12-10 DIAGNOSIS — K56609 Unspecified intestinal obstruction, unspecified as to partial versus complete obstruction: Secondary | ICD-10-CM | POA: Diagnosis not present

## 2020-12-10 DIAGNOSIS — M810 Age-related osteoporosis without current pathological fracture: Secondary | ICD-10-CM | POA: Diagnosis present

## 2020-12-10 DIAGNOSIS — R1084 Generalized abdominal pain: Secondary | ICD-10-CM | POA: Diagnosis not present

## 2020-12-10 DIAGNOSIS — M419 Scoliosis, unspecified: Secondary | ICD-10-CM | POA: Diagnosis present

## 2020-12-10 DIAGNOSIS — R070 Pain in throat: Secondary | ICD-10-CM | POA: Diagnosis not present

## 2020-12-10 DIAGNOSIS — E440 Moderate protein-calorie malnutrition: Secondary | ICD-10-CM | POA: Diagnosis present

## 2020-12-10 DIAGNOSIS — E876 Hypokalemia: Secondary | ICD-10-CM | POA: Diagnosis not present

## 2020-12-10 DIAGNOSIS — Z7689 Persons encountering health services in other specified circumstances: Secondary | ICD-10-CM | POA: Diagnosis not present

## 2020-12-10 DIAGNOSIS — J9811 Atelectasis: Secondary | ICD-10-CM | POA: Diagnosis present

## 2020-12-10 DIAGNOSIS — Z880 Allergy status to penicillin: Secondary | ICD-10-CM

## 2020-12-10 DIAGNOSIS — K5669 Other partial intestinal obstruction: Secondary | ICD-10-CM | POA: Diagnosis not present

## 2020-12-10 DIAGNOSIS — Z0189 Encounter for other specified special examinations: Secondary | ICD-10-CM

## 2020-12-10 DIAGNOSIS — R911 Solitary pulmonary nodule: Secondary | ICD-10-CM | POA: Diagnosis not present

## 2020-12-10 DIAGNOSIS — R059 Cough, unspecified: Secondary | ICD-10-CM | POA: Diagnosis not present

## 2020-12-10 DIAGNOSIS — I110 Hypertensive heart disease with heart failure: Secondary | ICD-10-CM | POA: Diagnosis present

## 2020-12-10 DIAGNOSIS — R109 Unspecified abdominal pain: Secondary | ICD-10-CM | POA: Diagnosis not present

## 2020-12-10 DIAGNOSIS — I10 Essential (primary) hypertension: Secondary | ICD-10-CM | POA: Diagnosis present

## 2020-12-10 DIAGNOSIS — D839 Common variable immunodeficiency, unspecified: Secondary | ICD-10-CM | POA: Diagnosis not present

## 2020-12-10 DIAGNOSIS — Z79899 Other long term (current) drug therapy: Secondary | ICD-10-CM | POA: Diagnosis not present

## 2020-12-10 DIAGNOSIS — Z4659 Encounter for fitting and adjustment of other gastrointestinal appliance and device: Secondary | ICD-10-CM

## 2020-12-10 DIAGNOSIS — E039 Hypothyroidism, unspecified: Secondary | ICD-10-CM | POA: Diagnosis present

## 2020-12-10 DIAGNOSIS — K219 Gastro-esophageal reflux disease without esophagitis: Secondary | ICD-10-CM | POA: Diagnosis not present

## 2020-12-10 DIAGNOSIS — Z885 Allergy status to narcotic agent status: Secondary | ICD-10-CM

## 2020-12-10 DIAGNOSIS — K0889 Other specified disorders of teeth and supporting structures: Secondary | ICD-10-CM | POA: Diagnosis not present

## 2020-12-10 DIAGNOSIS — Z90711 Acquired absence of uterus with remaining cervical stump: Secondary | ICD-10-CM

## 2020-12-10 DIAGNOSIS — M1990 Unspecified osteoarthritis, unspecified site: Secondary | ICD-10-CM | POA: Diagnosis not present

## 2020-12-10 DIAGNOSIS — Z79890 Hormone replacement therapy (postmenopausal): Secondary | ICD-10-CM

## 2020-12-10 DIAGNOSIS — R002 Palpitations: Secondary | ICD-10-CM | POA: Diagnosis not present

## 2020-12-10 DIAGNOSIS — Z888 Allergy status to other drugs, medicaments and biological substances status: Secondary | ICD-10-CM

## 2020-12-10 HISTORY — PX: ESOPHAGOGASTRODUODENOSCOPY (EGD) WITH PROPOFOL: SHX5813

## 2020-12-10 HISTORY — DX: Common variable immunodeficiency, unspecified: D83.9

## 2020-12-10 LAB — LACTIC ACID, PLASMA: Lactic Acid, Venous: 0.9 mmol/L (ref 0.5–1.9)

## 2020-12-10 LAB — CBC WITH DIFFERENTIAL/PLATELET
Abs Immature Granulocytes: 0.09 10*3/uL — ABNORMAL HIGH (ref 0.00–0.07)
Basophils Absolute: 0.1 10*3/uL (ref 0.0–0.1)
Basophils Relative: 1 %
Eosinophils Absolute: 0 10*3/uL (ref 0.0–0.5)
Eosinophils Relative: 0 %
HCT: 42.6 % (ref 36.0–46.0)
Hemoglobin: 14.7 g/dL (ref 12.0–15.0)
Immature Granulocytes: 1 %
Lymphocytes Relative: 13 %
Lymphs Abs: 2.1 10*3/uL (ref 0.7–4.0)
MCH: 32 pg (ref 26.0–34.0)
MCHC: 34.5 g/dL (ref 30.0–36.0)
MCV: 92.6 fL (ref 80.0–100.0)
Monocytes Absolute: 0.5 10*3/uL (ref 0.1–1.0)
Monocytes Relative: 3 %
Neutro Abs: 13.6 10*3/uL — ABNORMAL HIGH (ref 1.7–7.7)
Neutrophils Relative %: 82 %
Platelets: 248 10*3/uL (ref 150–400)
RBC: 4.6 MIL/uL (ref 3.87–5.11)
RDW: 13.2 % (ref 11.5–15.5)
WBC: 16.3 10*3/uL — ABNORMAL HIGH (ref 4.0–10.5)
nRBC: 0 % (ref 0.0–0.2)

## 2020-12-10 LAB — COMPREHENSIVE METABOLIC PANEL
ALT: 16 U/L (ref 0–44)
AST: 24 U/L (ref 15–41)
Albumin: 4.9 g/dL (ref 3.5–5.0)
Alkaline Phosphatase: 64 U/L (ref 38–126)
Anion gap: 14 (ref 5–15)
BUN: 14 mg/dL (ref 8–23)
CO2: 23 mmol/L (ref 22–32)
Calcium: 10.3 mg/dL (ref 8.9–10.3)
Chloride: 104 mmol/L (ref 98–111)
Creatinine, Ser: 0.73 mg/dL (ref 0.44–1.00)
GFR, Estimated: >60 mL/min (ref >60)
Glucose, Bld: 145 mg/dL — ABNORMAL HIGH (ref 70–99)
Potassium: 3.6 mmol/L (ref 3.5–5.1)
Sodium: 141 mmol/L (ref 135–145)
Total Bilirubin: 0.6 mg/dL (ref 0.3–1.2)
Total Protein: 7.9 g/dL (ref 6.5–8.1)

## 2020-12-10 LAB — LIPASE, BLOOD: Lipase: 19 U/L (ref 11–51)

## 2020-12-10 LAB — RESP PANEL BY RT-PCR (FLU A&B, COVID) ARPGX2
Influenza A by PCR: NEGATIVE
Influenza B by PCR: NEGATIVE
SARS Coronavirus 2 by RT PCR: NEGATIVE

## 2020-12-10 SURGERY — ESOPHAGOGASTRODUODENOSCOPY (EGD) WITH PROPOFOL
Anesthesia: General

## 2020-12-10 MED ORDER — ONDANSETRON HCL 4 MG/2ML IJ SOLN
4.0000 mg | Freq: Once | INTRAMUSCULAR | Status: AC
  Administered 2020-12-10: 4 mg via INTRAVENOUS
  Filled 2020-12-10: qty 2

## 2020-12-10 MED ORDER — LIDOCAINE VISCOUS HCL 2 % MT SOLN
15.0000 mL | OROMUCOSAL | Status: DC | PRN
  Administered 2020-12-13 – 2020-12-16 (×3): 15 mL via OROMUCOSAL
  Filled 2020-12-10 (×4): qty 15

## 2020-12-10 MED ORDER — HYDROMORPHONE HCL 1 MG/ML IJ SOLN
0.5000 mg | INTRAMUSCULAR | Status: DC | PRN
  Administered 2020-12-10 – 2020-12-16 (×22): 0.5 mg via INTRAVENOUS
  Filled 2020-12-10 (×22): qty 1

## 2020-12-10 MED ORDER — PROPOFOL 10 MG/ML IV BOLUS
INTRAVENOUS | Status: DC | PRN
  Administered 2020-12-10: 150 mg via INTRAVENOUS

## 2020-12-10 MED ORDER — LIDOCAINE HCL URETHRAL/MUCOSAL 2 % EX GEL
1.0000 "application " | Freq: Once | CUTANEOUS | Status: AC
  Administered 2020-12-10: 1 via TOPICAL
  Filled 2020-12-10: qty 11

## 2020-12-10 MED ORDER — FENTANYL CITRATE (PF) 100 MCG/2ML IJ SOLN
INTRAMUSCULAR | Status: DC | PRN
  Administered 2020-12-10: 100 ug via INTRAVENOUS

## 2020-12-10 MED ORDER — LISINOPRIL 20 MG PO TABS: 20.0000 mg | ORAL_TABLET | Freq: Every day | ORAL | Status: DC

## 2020-12-10 MED ORDER — DENOSUMAB 60 MG/ML ~~LOC~~ SOLN: 60.0000 mg | SUBCUTANEOUS | Status: DC

## 2020-12-10 MED ORDER — HYDRALAZINE HCL 20 MG/ML IJ SOLN: 10.0000 mg | INTRAMUSCULAR | Status: DC | PRN

## 2020-12-10 MED ORDER — DIATRIZOATE MEGLUMINE & SODIUM 66-10 % PO SOLN
90.0000 mL | Freq: Once | ORAL | Status: DC
  Filled 2020-12-10: qty 90

## 2020-12-10 MED ORDER — HYDROMORPHONE HCL 1 MG/ML IJ SOLN
0.5000 mg | Freq: Once | INTRAMUSCULAR | Status: AC
  Administered 2020-12-10: 0.5 mg via INTRAVENOUS
  Filled 2020-12-10: qty 1

## 2020-12-10 MED ORDER — ACETAMINOPHEN 500 MG PO TABS: 500.0000 mg | ORAL_TABLET | Freq: Four times a day (QID) | ORAL | Status: DC | PRN

## 2020-12-10 MED ORDER — LIDOCAINE 2% (20 MG/ML) 5 ML SYRINGE
INTRAMUSCULAR | Status: DC | PRN
  Administered 2020-12-10: 100 mg via INTRAVENOUS

## 2020-12-10 MED ORDER — IOHEXOL 300 MG/ML  SOLN
75.0000 mL | Freq: Once | INTRAMUSCULAR | Status: AC | PRN
  Administered 2020-12-10: 75 mL via INTRAVENOUS

## 2020-12-10 MED ORDER — LACTATED RINGERS IV SOLN
INTRAVENOUS | Status: AC | PRN
  Administered 2020-12-10: 20 mL/h via INTRAVENOUS

## 2020-12-10 MED ORDER — CALCIUM CARBONATE-VITAMIN D 500-200 MG-UNIT PO TABS
1.0000 | ORAL_TABLET | Freq: Two times a day (BID) | ORAL | Status: DC
  Filled 2020-12-10: qty 1

## 2020-12-10 MED ORDER — ONDANSETRON HCL 4 MG/2ML IJ SOLN
4.0000 mg | Freq: Four times a day (QID) | INTRAMUSCULAR | Status: DC | PRN
  Administered 2020-12-10 – 2020-12-17 (×13): 4 mg via INTRAVENOUS
  Filled 2020-12-10 (×15): qty 2

## 2020-12-10 MED ORDER — HYDROMORPHONE HCL 1 MG/ML IJ SOLN
0.5000 mg | Freq: Once | INTRAMUSCULAR | Status: AC
Start: 2020-12-10 — End: 2020-12-10
  Administered 2020-12-10: 0.5 mg via INTRAVENOUS
  Filled 2020-12-10: qty 1

## 2020-12-10 MED ORDER — LACTATED RINGERS IV BOLUS: 500.0000 mL | Freq: Once | INTRAVENOUS | Status: DC

## 2020-12-10 MED ORDER — LEVOTHYROXINE SODIUM 100 MCG PO TABS
100.0000 ug | ORAL_TABLET | Freq: Every day | ORAL | Status: DC
  Filled 2020-12-10: qty 1

## 2020-12-10 MED ORDER — FENTANYL CITRATE (PF) 100 MCG/2ML IJ SOLN
INTRAMUSCULAR | Status: AC
  Filled 2020-12-10: qty 2

## 2020-12-10 MED ORDER — SUCCINYLCHOLINE CHLORIDE 200 MG/10ML IV SOSY
PREFILLED_SYRINGE | INTRAVENOUS | Status: DC | PRN
  Administered 2020-12-10: 150 mg via INTRAVENOUS

## 2020-12-10 MED ORDER — ACETAMINOPHEN 325 MG PO TABS
650.0000 mg | ORAL_TABLET | Freq: Four times a day (QID) | ORAL | Status: DC | PRN
  Filled 2020-12-10: qty 2

## 2020-12-10 MED ORDER — OLOPATADINE HCL 0.1 % OP SOLN
1.0000 [drp] | Freq: Two times a day (BID) | OPHTHALMIC | Status: DC
  Administered 2020-12-11 – 2020-12-19 (×16): 1 [drp] via OPHTHALMIC
  Filled 2020-12-10: qty 5

## 2020-12-10 MED ORDER — ONDANSETRON 4 MG PO TBDP
4.0000 mg | ORAL_TABLET | Freq: Four times a day (QID) | ORAL | Status: DC | PRN
  Administered 2020-12-15: 4 mg via ORAL
  Filled 2020-12-10: qty 1

## 2020-12-10 MED ORDER — METOPROLOL TARTRATE 5 MG/5ML IV SOLN: 5.0000 mg | Freq: Three times a day (TID) | INTRAVENOUS | Status: DC | PRN

## 2020-12-10 MED ORDER — PANTOPRAZOLE SODIUM 40 MG IV SOLR
40.0000 mg | INTRAVENOUS | Status: DC
  Administered 2020-12-10 – 2020-12-13 (×4): 40 mg via INTRAVENOUS
  Filled 2020-12-10 (×4): qty 40

## 2020-12-10 MED ORDER — PANTOPRAZOLE SODIUM 40 MG PO TBEC: 40.0000 mg | DELAYED_RELEASE_TABLET | Freq: Every day | ORAL | Status: DC

## 2020-12-10 MED ORDER — EPINEPHRINE 0.3 MG/0.3ML IJ SOAJ: 0.3000 mg | INTRAMUSCULAR | Status: DC | PRN

## 2020-12-10 MED ORDER — FENTANYL CITRATE (PF) 100 MCG/2ML IJ SOLN
50.0000 ug | Freq: Once | INTRAMUSCULAR | Status: AC
  Administered 2020-12-10: 50 ug via INTRAVENOUS
  Filled 2020-12-10: qty 2

## 2020-12-10 MED ORDER — LACTATED RINGERS IV SOLN: INTRAVENOUS | Status: DC

## 2020-12-10 MED ORDER — FENTANYL CITRATE (PF) 100 MCG/2ML IJ SOLN
100.0000 ug | Freq: Once | INTRAMUSCULAR | Status: AC
  Administered 2020-12-10: 100 ug via INTRAVENOUS
  Filled 2020-12-10: qty 2

## 2020-12-10 MED ORDER — FERROUS FUMARATE 324 (106 FE) MG PO TABS
1.0000 | ORAL_TABLET | Freq: Every day | ORAL | Status: DC
  Filled 2020-12-10 (×4): qty 1

## 2020-12-10 MED ORDER — GABAPENTIN 300 MG PO CAPS
300.0000 mg | ORAL_CAPSULE | Freq: Four times a day (QID) | ORAL | Status: DC
  Filled 2020-12-10: qty 1

## 2020-12-10 MED ORDER — METOPROLOL TARTRATE 5 MG/5ML IV SOLN: 2.5000 mg | Freq: Three times a day (TID) | INTRAVENOUS | Status: DC

## 2020-12-10 MED ORDER — METOPROLOL TARTRATE 12.5 MG HALF TABLET
12.5000 mg | ORAL_TABLET | Freq: Two times a day (BID) | ORAL | Status: DC
  Filled 2020-12-10: qty 1

## 2020-12-10 MED ORDER — LIDOCAINE VISCOUS HCL 2 % MT SOLN
5.0000 mL | Freq: Once | OROMUCOSAL | Status: AC
  Administered 2020-12-10: 5 mL via OROMUCOSAL

## 2020-12-10 MED ORDER — ACETAMINOPHEN 650 MG RE SUPP: 650.0000 mg | Freq: Four times a day (QID) | RECTAL | Status: DC | PRN

## 2020-12-10 MED ORDER — POTASSIUM CHLORIDE ER 10 MEQ PO TBCR
10.0000 meq | EXTENDED_RELEASE_TABLET | Freq: Every day | ORAL | Status: DC
  Filled 2020-12-10 (×2): qty 1

## 2020-12-10 MED ORDER — IMMUNE GLOBULIN (HUMAN) 4 GM/20ML ~~LOC~~ SOLN: 8.0000 g | SUBCUTANEOUS | Status: DC

## 2020-12-10 SURGICAL SUPPLY — 14 items

## 2020-12-10 NOTE — Progress Notes (Addendum)
Update:  RN was unable to pass NG tube on 6th attempt. Patient was sent for IR NG tube under fluoroscopy.  This was also unsuccessful, with "abrupt and persistent resistance was met at the level of C7/T1 on every attempt."  CT neck/chest is an option as well as GI consultation.  I called and discussed with Dr. Therisa Doyne, who will review and may decide to take the patient for endoscopy this afternoon.   She will consult.  GI will take for EGD at 1900.  Meanwhile, will order CT of neck fur further evaluation.   Carlyon Shadow, M.D.

## 2020-12-10 NOTE — ED Triage Notes (Signed)
Pt reports abd pain like constant cramping and vomiting  since last night

## 2020-12-10 NOTE — ED Provider Notes (Signed)
  Physical Exam  BP 118/79   Pulse 60   Temp 98.5 F (36.9 C) (Oral)   Resp 16   Ht 5\' 3"  (1.6 m)   Wt 63.5 kg   SpO2 97%   BMI 24.80 kg/m   Physical Exam Vitals and nursing note reviewed.  Constitutional:      General: She is not in acute distress.    Appearance: She is well-developed.  HENT:     Head: Normocephalic and atraumatic.  Pulmonary:     Effort: Pulmonary effort is normal.  Musculoskeletal:        General: Normal range of motion.     Cervical back: Normal range of motion.  Skin:    General: Skin is warm.     Findings: No rash.  Neurological:     Mental Status: She is alert and oriented to person, place, and time.    ED Course/Procedures   Clinical Course as of 12/10/20 0919  Tue Dec 10, 2020  0455 Spoke with general surgery, Dr. Zenia Resides.  She will review CT.  Given concern for possible vascular and lymphatic compromise, request ED to ED transfer. [CH]  [phone] Spoke with Dr. Christy Gentles and Dr. Dayna Barker at [facility].  They have accepted patient for transfer.  Per Biomedical engineer, transport not available until 7 AM.  I have requested patient be prioritized through [facility] [[facility]] Horton, Barbette Hair, MD    Procedures  MDM   Patient seen at [facility] ED. please see previous notes for further history.  In brief, patient presenting for evaluation of nausea, vomiting, abdominal pain.  Work-up concerning for small bowel obstruction with possible vascular and lymphatic compromise.  This was discussed with Dr. Zenia Resides from general surgery, who recommended an ED to ED transfer.  On my evaluation, patient states her pain is controlled, but she still has a lot of nausea.  Will consult with general surgery.  Discussed with L Simaan, PA-C from general surgery, and will consult on the patient.  Requesting medicine admit.  Discussed with Dr. Lorin Mercy from [facility] hospitalist service, patient to be admitted to        Franchot Heidelberg,  PA-C 12/10/20 [place], [place], MD 12/10/20 [phone]

## 2020-12-10 NOTE — Consult Note (Signed)
[facility] Gastroenterology Consult  Referring Provider: No ref. provider found Primary Care Physician:  Janith Lima, MD Primary Gastroenterologist: Dr.Schooler  Reason for Consultation: Inability to pass NG tube  HPI: [name] is a 86 y.o. female presents to the ER with nausea, vomiting, crampy abdominal pain, transferred from [facility] with small bowel obstruction. Last bowel movement yesterday evening, no abdominal pain currently but has not passed flatus since yesterday evening.  Multiple attempts at placing NG tube was unsuccessful, including bedside attempts by RN and under fluoroscopy by IR.  Patient noted to have internal herniation of distal ileum with extensive mesenteric stranding, large hiatal hernia with free fluid in at bedtime and herniated gastric body, diverticulosis.    Past Medical History:  Diagnosis Date   Anemia    pt denies   Arthritis    Atrial fibrillation (HCC)    Cataract    bil   Cystocele    history with rectocele, pt denies   GERD (gastroesophageal reflux disease)    H/O hiatal hernia    Hypertension    Hypothyroidism    Non Hodgkin's lymphoma ([place])    dx 2012 and now in remission   Osteoporosis    Pacemaker 09/15/2011   Medtronic Revo   Pericarditis    Pleural effusion, bilateral    history of   Pulmonary nodules    history of   Scoliosis    Varicose vein     Past Surgical History:  Procedure Laterality Date   BREAST SURGERY  07/30/01   biopsy x 3, bilateral   CATARACT EXTRACTION Bilateral    COLONOSCOPY     MASS EXCISION Right 10/28/2012   Procedure: Removal of mass on neck       ;  Surgeon: Adin Hector, MD;  Location: [facility];  Service: General;  Laterality: Right;   PARTIAL HYSTERECTOMY  04/29/2004   PERMANENT PACEMAKER INSERTION N/A 09/15/2011   Procedure: PERMANENT PACEMAKER INSERTION;  Surgeon: Sanda Klein, MD;  Location: [facility] CATH LAB;  Service: Cardiovascular;  Laterality: N/A;   RADIOACTIVE SEED GUIDED EXCISIONAL BREAST  BIOPSY Left 08/11/2017   Procedure: LEFT RADIOACTIVE SEED GUIDED EXCISIONAL BREAST BIOPSY ERAS PATHWAY;  Surgeon: Stark Klein, MD;  Location: [facility];  Service: General;  Laterality: Left;   TONSILLECTOMY      Prior to Admission medications   Medication Sig Start Date End Date Taking? Authorizing Provider  acetaminophen (TYLENOL) 500 MG tablet Take 500 mg by mouth every 6 (six) hours as needed (for pain.).    Yes [provider]  Calcium Carbonate-Vitamin D 600-400 MG-UNIT tablet Take 1 tablet by mouth 2 (two) times daily.   Yes [provider]  denosumab (PROLIA) 60 MG/ML SOLN injection Inject 60 mg into the skin every 6 (six) months. Reported on 10/16/2015   Yes [provider]  EPINEPHrine 0.3 mg/0.3 mL IJ SOAJ injection Inject 0.3 mg into the muscle as needed for anaphylaxis. 10/23/20  Yes [provider]  ferrous fumarate (HEMOCYTE - 106 MG FE) 325 (106 Fe) MG TABS tablet Take 1 tablet by mouth daily.   Yes [provider]  gabapentin (NEURONTIN) 300 MG capsule Take 1 capsule (300 mg total) by mouth 4 (four) times daily. 10/09/20 04/23/21 Yes Janith Lima, MD  Immune Globulin, Human, (HIZENTRA) 4 GM/20ML SOLN Inject 8 g into the skin once a week.   Yes [provider]  levothyroxine (SYNTHROID) 100 MCG tablet Take 1 tablet (100 mcg total) by mouth daily.  10/09/20  Yes Janith Lima, MD  lisinopril (ZESTRIL) 20 MG tablet Take 1 tablet (20 mg total) by mouth in the morning and at bedtime. 10/09/20  Yes Janith Lima, MD  Magnesium Oxide (MAG-OX 400 PO) Take 1 tablet by mouth daily.   Yes [provider]  metoprolol tartrate (LOPRESSOR) 25 MG tablet TAKE 1 TABLET(25 MG) BY MOUTH DAILY. MAY TAKE 1/2 TABLET EXTRA IF SYSTOLIC BLOOD PRESSURE IS ABOVE 160 Patient taking differently: Take 12.5 mg by mouth 2 (two) times daily. 01/10/20  Yes Croitoru, Mihai, MD  olopatadine (PATANOL) 0.1 % ophthalmic solution Place 1 drop into both eyes 2  times daily. 05/08/20  Yes Marrian Salvage, FNP  omeprazole (PRILOSEC) 40 MG capsule TAKE 1 CAPSULE(40 MG) BY MOUTH DAILY Patient taking differently: Take 40 mg by mouth daily. 01/24/20  Yes Marrian Salvage, FNP  potassium chloride (KLOR-CON) 10 MEQ tablet TAKE 1 TABLET(10 MEQ) BY MOUTH DAILY Patient taking differently: Take 10 mEq by mouth daily. 06/24/20  Yes Croitoru, Mihai, MD    Current Facility-Administered Medications  Medication Dose Route Frequency Provider Last Rate Last Admin   acetaminophen (TYLENOL) tablet 650 mg  650 mg Oral Q6H PRN Karmen Bongo, MD       Or   acetaminophen (TYLENOL) suppository 650 mg  650 mg Rectal Q6H PRN Karmen Bongo, MD       hydrALAZINE (APRESOLINE) injection 10 mg  10 mg Intravenous Q2H PRN Karmen Bongo, MD       HYDROmorphone (DILAUDID) injection 0.5 mg  0.5 mg Intravenous Q2H PRN Karmen Bongo, MD   0.5 mg at 12/10/20 1415   lactated ringers bolus 500 mL  500 mL Intravenous Once Karmen Bongo, MD       lactated ringers infusion   Intravenous Continuous Karmen Bongo, MD 75 mL/hr at 12/10/20 1302 New Bag at 12/10/20 1302   lidocaine (XYLOCAINE) 2 % viscous mouth solution 15 mL  15 mL Mouth/Throat Q4H PRN Karmen Bongo, MD       metoprolol tartrate (LOPRESSOR) injection 5 mg  5 mg Intravenous Q8H PRN Karmen Bongo, MD       ondansetron (ZOFRAN-ODT) disintegrating tablet 4 mg  4 mg Oral Q6H PRN Karmen Bongo, MD       Or   ondansetron [facility]) injection 4 mg  4 mg Intravenous Q6H PRN Karmen Bongo, MD   4 mg at 12/10/20 1211   Current Outpatient Medications  Medication Sig Dispense Refill   acetaminophen (TYLENOL) 500 MG tablet Take 500 mg by mouth every 6 (six) hours as needed (for pain.).      Calcium Carbonate-Vitamin D 600-400 MG-UNIT tablet Take 1 tablet by mouth 2 (two) times daily.     denosumab (PROLIA) 60 MG/ML SOLN injection Inject 60 mg into the skin every 6 (six) months. Reported on 10/16/2015      EPINEPHrine 0.3 mg/0.3 mL IJ SOAJ injection Inject 0.3 mg into the muscle as needed for anaphylaxis.     ferrous fumarate (HEMOCYTE - 106 MG FE) 325 (106 Fe) MG TABS tablet Take 1 tablet by mouth daily.     gabapentin (NEURONTIN) 300 MG capsule Take 1 capsule (300 mg total) by mouth 4 (four) times daily. 360 capsule 1   Immune Globulin, Human, (HIZENTRA) 4 GM/20ML SOLN Inject 8 g into the skin once a week.     levothyroxine (SYNTHROID) 100 MCG tablet Take 1 tablet (100 mcg total) by mouth daily. 90 tablet 1   lisinopril (ZESTRIL) 20 MG  tablet Take 1 tablet (20 mg total) by mouth in the morning and at bedtime. 180 tablet 1   Magnesium Oxide (MAG-OX 400 PO) Take 1 tablet by mouth daily.     metoprolol tartrate (LOPRESSOR) 25 MG tablet TAKE 1 TABLET(25 MG) BY MOUTH DAILY. MAY TAKE 1/2 TABLET EXTRA IF SYSTOLIC BLOOD PRESSURE IS ABOVE 160 (Patient taking differently: Take 12.5 mg by mouth 2 (two) times daily.) 90 tablet 3   olopatadine (PATANOL) 0.1 % ophthalmic solution Place 1 drop into both eyes 2 times daily. 5 mL 1   omeprazole (PRILOSEC) 40 MG capsule TAKE 1 CAPSULE(40 MG) BY MOUTH DAILY (Patient taking differently: Take 40 mg by mouth daily.) 90 capsule 3   potassium chloride (KLOR-CON) 10 MEQ tablet TAKE 1 TABLET(10 MEQ) BY MOUTH DAILY (Patient taking differently: Take 10 mEq by mouth daily.) 90 tablet 0    Allergies as of 12/10/2020 - Review Complete 12/10/2020  Allergen Reaction Noted   Amoxicillin Hives 04/09/2011   Buprenorphine Hives 03/31/2016   Codeine Hives and Other (See Comments)    Demerol Hives 03/11/2011   Morphine and related Hives 03/11/2011   Sulfonamide derivatives Rash    Vicodin [hydrocodone-acetaminophen] Hives 03/11/2011    Family History  Problem Relation Age of Onset   Stroke Mother    Stroke Father    Heart disease Daughter        congenital "whole in her heart"   Lymphoma Daughter        nonhodgkins   Breast cancer Sister    Heart disease Brother    Colon  cancer Neg Hx    Esophageal cancer Neg Hx    Pancreatic cancer Neg Hx    Stomach cancer Neg Hx    Liver disease Neg Hx     Social History   Socioeconomic History   Marital status: Widowed    Spouse name: Not on file   Number of children: 3   Years of education: Not on file   Highest education level: Not on file  Occupational History   Occupation: [employer]  Tobacco Use   Smoking status: Never   Smokeless tobacco: Never  Vaping Use   Vaping Use: Never used  Substance and Sexual Activity   Alcohol use: Yes    Comment: 1 glass of wine occasionally   Drug use: No   Sexual activity: Not on file  Other Topics Concern   Not on file  Social History Narrative   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  She lives alone in a 3-story home.  There is a lift chair which she does not use (placed for her husband).   Social Determinants of Health   Financial Resource Strain: Not on file  Food Insecurity: Not on file  Transportation Needs: Not on file  Physical Activity: Not on file  Stress: Not on file  Social Connections: Not on file  Intimate Partner Violence: Not on file    Review of Systems: GI: Described in detail in HPI.    Gen: Denies any fever, chills, rigors, night sweats, anorexia, fatigue, weakness, malaise, involuntary weight loss, and sleep disorder CV: PPM in place, Denies chest pain, angina, palpitations, syncope, orthopnea, PND, peripheral edema, and claudication. Resp: Denies dyspnea, cough, sputum, wheezing, coughing up blood. GU : Denies urinary burning, blood in urine, urinary frequency, urinary hesitancy, nocturnal urination, and urinary incontinence. MS: Denies joint pain or swelling.  Denies muscle weakness, cramps, atrophy.  Derm: Denies rash,  itching, oral ulcerations, hives, unhealing ulcers.  Psych: Denies depression, anxiety, memory loss, suicidal ideation, hallucinations,  and confusion. Heme:  Denies bruising, bleeding, and enlarged lymph nodes. Neuro:  Denies any headaches, dizziness, paresthesias. Endo:  Denies any problems with DM, thyroid, adrenal function.  Physical Exam: Vital signs in last 24 hours: Temp:  [98.1 F (36.7 C)-98.5 F (36.9 C)] 98.1 F (36.7 C) (06/14 0836) Pulse Rate:  [56-71] 60 (06/14 1545) Resp:  [13-19] 17 (06/14 1545) BP: (82-170)/(56-105) 118/56 (06/14 1545) SpO2:  [81 %-100 %] 91 % (06/14 1545) Weight:  [63.5 kg] 63.5 kg (06/14 0259)    General:   Alert,  Well-developed, thinly built, pleasant and cooperative in NAD Head:  Normocephalic and atraumatic. Eyes:  Sclera clear, no icterus.   Conjunctiva pink. Ears:  Normal auditory acuity. Nose:  No deformity, discharge,  or lesions. Mouth:  No deformity or lesions.  Oropharynx pink & moist. Neck:  Supple; no masses or thyromegaly. Lungs:  Clear throughout to auscultation.   No wheezes, crackles, or rhonchi. No acute distress. Heart:  Regular rate and rhythm; no murmurs, clicks, rubs,  or gallops. Extremities:  Without clubbing or edema. Neurologic:  Alert and  oriented x4;  grossly normal neurologically. Skin:  Intact without significant lesions or rashes. Psych:  Alert and cooperative. Normal mood and affect. Abdomen:  Soft, nontender and nondistended. BS not audible       Lab Results: Recent Labs    12/10/20 0304  WBC 16.3*  HGB 14.7  HCT 42.6  PLT 248   BMET Recent Labs    12/10/20 0304  NA 141  K 3.6  CL 104  CO2 23  GLUCOSE 145*  BUN 14  CREATININE 0.73  CALCIUM 10.3   LFT Recent Labs    12/10/20 0304  PROT 7.9  ALBUMIN 4.9  AST 24  ALT 16  ALKPHOS 64  BILITOT 0.6   PT/INR No results for input(s): LABPROT, INR in the last 72 hours.  Studies/Results: CT ABDOMEN PELVIS W CONTRAST  Addendum Date: 12/10/2020   ADDENDUM REPORT: 12/10/2020 04:50 ADDENDUM: These results were called by telephone at the time of physician contact on 12/10/2020 at 4:50 am to  provider COURTNEY HORTON , who verbally acknowledged these results. Electronically Signed   By: Lovena Le M.D.   On: 12/10/2020 04:50   Result Date: 12/10/2020 CLINICAL DATA:  Abdominal pain and cramping with emesis since last night EXAM: CT ABDOMEN AND PELVIS WITH CONTRAST TECHNIQUE: Multidetector CT imaging of the abdomen and pelvis was performed using the standard protocol following bolus administration of intravenous contrast. CONTRAST:  56mL OMNIPAQUE IOHEXOL 300 MG/ML  SOLN COMPARISON:  CT 06/16/2019 FINDINGS: Lower chest: Small pericardial effusion, diminished from prior exam. Pacer leads terminate at the cardiac apex and right atrium. Cardiac size is borderline enlarged though similar to comparison prior. Scarring and atelectatic changes in the left lung base adjacent a large hiatal hernia further detailed below. Some additional atelectasis noted in the posterior right lower lobe. Few scattered calcified noncalcified lower lobe micro nodules are not significantly changed from prior. Hepatobiliary: Few stable subcentimeter hypoattenuating foci throughout the liver, too small to fully characterize on CT imaging but statistically likely benign. A slightly larger 11 mm hypodense focus towards the hepatic dome, probable cyst. Gallbladder is unremarkable. No significant biliary ductal dilatation accounting for typical senescent changes. No visible intraductal gallstone. Pancreas: No pancreatic ductal dilatation or surrounding inflammatory changes. Spleen: Normal in size. No concerning splenic lesions. Adrenals/Urinary Tract:  No concerning adrenal nodules or masses. Kidneys demonstrate symmetric enhancementand excretion. Scattered subcentimeter hypoattenuating foci throughout both kidneys too small to fully characterize on CT imaging but statistically likely benign. No suspicious renal lesion, urolithiasis or hydronephrosis. Urinary bladder is unremarkable for the degree of distention. Stomach/Bowel: Large  hiatal hernia with what appears to be some organoaxial rotation similar to prior exam albeit with some increased gastric distention by fluid air and ingested material. Small amount of low-attenuation free fluid is seen adjacent the herniated stomach (2/12). No extraluminal gas, organized collection or abscess is seen. Some mild mucosal hyperemia within the herniated gastric segments and gastric antrum are nonspecific. Duodenum with a normal sweep across the midline abdomen. Distal small bowel appears diffusely fluid-filled with several segments demonstrating some conspicuous mural thickening in the right lower quadrant (2/37). There are few clustered transition points noted in the right lower quadrant with displaced small bowel and associated hazy mesenteric changes (2/51) concerning for an obstructive, internal hernia involving the distal ileum with decompression to the level of the ileocecal valve in the right lower quadrant. Normal appendix. Moderate colonic stool burden. Extensive pancolonic diverticulosis most pronounced in the more distal segments. No significant colonic thickening accounting for underdistention. Vascular/Lymphatic: Atherosclerotic calcifications within the abdominal aorta and branch vessels. No aneurysm or ectasia. Extensive aortic tortuosity similar to prior. Some congested lymph nodes noted within the right lower quadrant as well as additional reactive nodes in the central mesentery. Reproductive: Uterus is surgically absent. No concerning adnexal lesions. Other: Small volume of free fluid seen in the deep pelvis. Hazy mesenteric stranding in the right lower quadrant associated with the deviated small bowel loops. Additional trace fluid in the left upper quadrant, upper retroperitoneum and adjacent the herniated proximal stomach. No free air. No other bowel containing hernia. Musculoskeletal: Severe levocurvature of the thoracolumbar junction and dextrocurvature of the lower lumbar levels.  Transitional lumbosacral vertebrae. Configuration is stable from priors. Multilevel discogenic and facet degenerative change with additional degenerative features in the hips and pelvis. No acute osseous abnormality or suspicious osseous lesion. IMPRESSION: 1. Appearance concerning for a developing small bowel obstruction secondary to what is likely an internal herniation of the distal ileum. Extensive hazy mesenteric stranding around the deviated loops of small bowel present in the right lower quadrant, as well as thickening of several of the obstructed, fluid-filled loops. Could reflect developing vascular or lymphatic compromise. 2. Large hiatal hernia. Small amount of nonspecific mucosal hyperemia as well as some free fluid adjacent the herniated gastric body. No extraluminal gas to suggest a frank perforation. Fluid may be reactive or redistributed from the more distal small bowel process. Considered decompression and possible direct visualization as clinically available. 3. Diverticulosis without evidence of acute diverticulitis. 4. Cardiomegaly and pericardial effusion. 5.  Aortic Atherosclerosis (ICD10-I70.0). 6. Severe scoliosis and associated chest wall deformity. 7. Several scattered calcified noncalcified pulmonary nodules without significant interval change. Electronically Signed: By: Lovena Le M.D. On: 12/10/2020 04:34   DG [facility] G Tube Plc W/Fl W/Rad  Result Date: 12/10/2020 CLINICAL DATA:  nasogastric tube placement EXAM: NASO G TUBE PLACEMENT WITH FL AND WITH RAD CONTRAST:  Light barium FLUOROSCOPY TIME:  Fluoroscopy Time:  3 minutes 12 seconds Radiation Exposure Index (if provided by the fluoroscopic device): 6.2 mGy Number of Acquired Spot Images: 0 COMPARISON:  Chest CT 06/14/2020 FINDINGS: Nasogastric tube placement was attempted utilizing fluoroscopic guidance. A 12 French nasogastric tube was inserted into left nostril and passed to level of C7/T1, where there  was abrupt resistance and  kinking of the nasogastric tube. A wire was placed through the nasogastric tube, and additional attempts were made to pass the tube further into the esophagus, without success. A small amount of light barium was injected through the tube which passed into the lower esophagus. A 12 French nasogastric tube was then removed and an attempt was made to pass a 10 French nasogastric tube. Again, the nasogastric tube met abrupt resistance at the level of C7/T1, with kinking of the tube. IMPRESSION: Unsuccessful nasogastric tube placement under fluoroscopic guidance despite multiple attempts and technique. Abrupt and persistent resistance was met at the level of C7/T1 on every attempt. Consider CT of the neck/chest to rule out an obstructing lesion. These results were called by telephone at the time of interpretation on 12/10/2020 at 3:20 pm to provider [facility] , who verbally acknowledged these results. Electronically Signed   By: Maurine Simmering   On: 12/10/2020 15:22    Impression: Small bowel obstruction Large hiatal hernia Unsuccessful NG tube placement under fluoroscopy guidance despite multiple attempts  Plan: EGD under anesthesia, will attempt NG tube placement under anesthesia. Management of small bowel obstruction as per surgical team. The risks and the benefits of the procedure were discussed in details with the patient and her daughter at bedside. They understand and verbalized consent.   LOS: 0 days   Ronnette Juniper, MD  12/10/2020, 5:03 PM

## 2020-12-10 NOTE — ED Notes (Signed)
Patient Transported to Radiology. 

## 2020-12-10 NOTE — Anesthesia Procedure Notes (Signed)
Procedure Name: Intubation Date/Time: 12/10/2020 5:52 PM Performed by: Moshe Salisbury, CRNA Pre-anesthesia Checklist: Patient identified, Emergency Drugs available, Suction available and Patient being monitored Patient Re-evaluated:Patient Re-evaluated prior to induction Oxygen Delivery Method: Circle System Utilized Preoxygenation: Pre-oxygenation with 100% oxygen Induction Type: IV induction, Rapid sequence and Cricoid Pressure applied Laryngoscope Size: Mac and 4 Tube type: Oral Tube size: 7.5 mm Number of attempts: 1 Airway Equipment and Method: Stylet and Oral airway Placement Confirmation: ETT inserted through vocal cords under direct vision, positive ETCO2 and breath sounds checked- equal and bilateral Secured at: 20 cm Tube secured with: Tape Dental Injury: Teeth and Oropharynx as per pre-operative assessment

## 2020-12-10 NOTE — ED Notes (Addendum)
Attempted 14 fr NG tube without succuss. Patient repeats "Its coild in my throat. I can feel it."  Attending called for this is the 6th attempt overall.    Rayburn Felt the bedside at 12:47

## 2020-12-10 NOTE — Anesthesia Postprocedure Evaluation (Signed)
Anesthesia Post Note  Patient: [name]  Procedure(s) Performed: ESOPHAGOGASTRODUODENOSCOPY (EGD) WITH PROPOFOL     Patient location during evaluation: PACU Anesthesia Type: General Level of consciousness: sedated and patient cooperative Pain management: pain level controlled Vital Signs Assessment: post-procedure vital signs reviewed and stable Respiratory status: spontaneous breathing Cardiovascular status: stable Anesthetic complications: no   No notable events documented.  Last Vitals:  Vitals:   12/10/20 1925 12/10/20 2002  BP: 130/73 (!) 164/73  Pulse: 60 61  Resp: 19 20  Temp: 36.7 C 36.5 C  SpO2: 94% 98%    Last Pain:  Vitals:   12/10/20 2002  TempSrc: Oral  PainSc: [place]

## 2020-12-10 NOTE — ED Notes (Signed)
Patient transported to Endo. The patients daughter will be waiting in the [facility] waiting area.  [place] Notified of these changes.

## 2020-12-10 NOTE — H&P (Signed)
History and Physical    [name] VQM:[id number] DOB: 03-16-1935 DOA: 12/10/2020  PCP: Janith Lima, MD Consultants:  Lloyd Huger - pulmonology; [place]; Stanford Breed - vascular; Julien Nordmann - oncology; Croitoru/Taylor - cardiology; Ahmen - allergy and immunology Patient coming from:  Home - lives with independently (and still works, owning [employer]); NOK: Daughter, [place], [phone]   Chief Complaint: Abdominal pain  HPI: [name] is a 86 y.o. female with medical history significant of afib;  HTN; hypothyroidism; pacemaker placement; PAD; CVID; and NHL in remission presenting with abdominal pain.  She had a normal day yesterday and ate a small but regular dinner last night.  She had crampy abdominal pain starting about 2030 and had a normal BM about 2100.  The pain continued to escalate and she developed n/v.  Despite multiple (now 6) attempts, NG tube placement has been unsuccessful.  She is feeling mildly better, though, and has not vomited since she was at [facility] (transferred ER:ER).  She previously had a vaginal partial hysterectomy but has not otherwise had abdominal surgery.    ED Course: Sent MCDB to [facility].  SBO with concern for vascular lymphatic compromise, surgery will see but requests [facility] admission.  Review of Systems: As per HPI; otherwise review of systems reviewed and negative.   Ambulatory Status:  Ambulates without assistance or with a cane  COVID Vaccine Status:   Complete  Past Medical History:  Diagnosis Date   Anemia    pt denies   Arthritis    Atrial fibrillation (HCC)    Cataract    bil   Cystocele    history with rectocele, pt denies   GERD (gastroesophageal reflux disease)    H/O hiatal hernia    Hypertension    Hypothyroidism    Non Hodgkin's lymphoma ([place])    dx 2012 and now in remission   Osteoporosis    Pacemaker 09/15/2011   Medtronic Revo   Pericarditis    Pleural effusion, bilateral    history of   Pulmonary nodules     history of   Scoliosis    Varicose vein     Past Surgical History:  Procedure Laterality Date   BREAST SURGERY  07/30/01   biopsy x 3, bilateral   CATARACT EXTRACTION Bilateral    COLONOSCOPY     MASS EXCISION Right 10/28/2012   Procedure: Removal of mass on neck       ;  Surgeon: Adin Hector, MD;  Location: [facility];  Service: General;  Laterality: Right;   PARTIAL HYSTERECTOMY  04/29/2004   PERMANENT PACEMAKER INSERTION N/A 09/15/2011   Procedure: PERMANENT PACEMAKER INSERTION;  Surgeon: Sanda Klein, MD;  Location: [facility] CATH [facility];  Service: Cardiovascular;  Laterality: N/A;   RADIOACTIVE SEED GUIDED EXCISIONAL BREAST BIOPSY Left 08/11/2017   Procedure: LEFT RADIOACTIVE SEED GUIDED EXCISIONAL BREAST BIOPSY ERAS PATHWAY;  Surgeon: Stark Klein, MD;  Location: [facility];  Service: General;  Laterality: Left;   TONSILLECTOMY      Social History   Socioeconomic History   Marital status: Widowed    Spouse name: Not on file   Number of children: 3   Years of education: Not on file   Highest education level: Not on file  Occupational History   Occupation: [employer]  Tobacco Use   Smoking status: Never   Smokeless tobacco: Never  Vaping Use   Vaping Use: Never used  Substance and Sexual Activity   Alcohol use: Yes  Comment: 1 glass of wine occasionally   Drug use: No   Sexual activity: Not on file  Other Topics Concern   Not on file  Social History Narrative   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  She lives alone in a 3-story home.  There is a lift chair which she does not use (placed for her husband).   Social Determinants of Health   Financial Resource Strain: Not on file  Food Insecurity: Not on file  Transportation Needs: Not on file  Physical Activity: Not on file  Stress: Not on file  Social Connections: Not on file  Intimate Partner Violence: Not on file    Allergies  Allergen Reactions    Amoxicillin Hives    Has patient had a PCN reaction causing immediate rash, facial/tongue/throat swelling, SOB or lightheadedness with hypotension: No Has patient had a PCN reaction causing severe rash involving mucus membranes or skin necrosis: No Has patient had a PCN reaction that required hospitalization: No Has patient had a PCN reaction occurring within the last 10 years: No If all of the above answers are "NO", then may proceed with Cephalosporin use.    Buprenorphine Hives   Codeine Hives and Other (See Comments)    "makes her crazy"    Demerol Hives   Morphine And Related Hives   Sulfonamide Derivatives Rash   Vicodin [Hydrocodone-Acetaminophen] Hives    Family History  Problem Relation Age of Onset   Stroke Mother    Stroke Father    Heart disease Daughter        congenital "whole in her heart"   Lymphoma Daughter        nonhodgkins   Breast cancer Sister    Heart disease Brother    Colon cancer Neg Hx    Esophageal cancer Neg Hx    Pancreatic cancer Neg Hx    Stomach cancer Neg Hx    Liver disease Neg Hx     Prior to Admission medications   Medication Sig Start Date End Date Taking? Authorizing Provider  acetaminophen (TYLENOL) 500 MG tablet Take 500 mg by mouth every 6 (six) hours as needed (for pain.).     [provider]  Calcium Carbonate-Vitamin D 600-400 MG-UNIT tablet Take 1 tablet by mouth 2 (two) times daily.    [provider]  denosumab (PROLIA) 60 MG/ML SOLN injection Inject 60 mg into the skin every 6 (six) months. Reported on 10/16/2015    [provider]  gabapentin (NEURONTIN) 300 MG capsule Take 1 capsule (300 mg total) by mouth 4 (four) times daily. 10/09/20 04/23/21  Janith Lima, MD  levothyroxine (SYNTHROID) 100 MCG tablet Take 1 tablet (100 mcg total) by mouth daily. 10/09/20   Janith Lima, MD  lisinopril (ZESTRIL) 20 MG tablet Take 1 tablet (20 mg total) by mouth in the morning and at bedtime. 10/09/20   Janith Lima, MD  Magnesium Oxide (MAG-OX 400 PO) Take 1 tablet by mouth daily.    [provider]  metoprolol tartrate (LOPRESSOR) 25 MG tablet TAKE 1 TABLET(25 MG) BY MOUTH DAILY. MAY TAKE 1/2 TABLET EXTRA IF SYSTOLIC BLOOD PRESSURE IS ABOVE 160 01/10/20   Croitoru, Mihai, MD  olopatadine (PATANOL) 0.1 % ophthalmic solution Place 1 drop into both eyes 2 times daily. 05/08/20   Marrian Salvage, FNP  omeprazole (PRILOSEC) 40 MG capsule TAKE 1 CAPSULE(40 MG) BY MOUTH DAILY 01/24/20   Marrian Salvage, FNP  potassium chloride (KLOR-CON) 10 MEQ tablet TAKE 1 TABLET(10 MEQ) BY MOUTH DAILY 06/24/20   Croitoru, Dani Gobble, MD    Physical Exam: Vitals:   12/10/20 0745 12/10/20 0836 12/10/20 0921 12/10/20 1030  BP: 118/79 140/76 125/75 112/70  Pulse: 60 62 61 (!) 59  Resp: 16 16 17 18   Temp:  98.1 F (36.7 C)    TempSrc:  Oral    SpO2: 97% 96% 95% 99%  Weight:      Height:         General:  Appears calm and comfortable and is in NAD Eyes:   EOMI, normal lids, iris ENT:  grossly normal hearing, lips & tongue, mmm; appropriate dentition; mildly right-sided deviated septum Neck:  no LAD, masses or thyromegaly Cardiovascular:  RRR, no m/r/g. No LE edema.  Respiratory:   CTA bilaterally with no wheezes/rales/rhonchi.  Normal respiratory effort. Abdomen:  soft, mildly diffusely tender without focal region, mildly distended, +BS Skin:  no rash or induration seen on limited exam Musculoskeletal:  grossly normal tone BUE/BLE, good ROM, no bony abnormality Psychiatric:  grossly normal mood and affect, speech fluent and appropriate, AOx3 Neurologic:  CN 2-12 grossly intact, moves all extremities in coordinated fashion    Radiological Exams on Admission: Independently reviewed - see discussion in A/P where applicable  CT ABDOMEN PELVIS W CONTRAST  Addendum Date: 12/10/2020   ADDENDUM REPORT: 12/10/2020 04:50 ADDENDUM: These results were called by telephone at the time of physician  contact on 12/10/2020 at 4:50 am to provider COURTNEY HORTON , who verbally acknowledged these results. Electronically Signed   By: Lovena Le M.D.   On: 12/10/2020 04:50   Result Date: 12/10/2020 CLINICAL DATA:  Abdominal pain and cramping with emesis since last night EXAM: CT ABDOMEN AND PELVIS WITH CONTRAST TECHNIQUE: Multidetector CT imaging of the abdomen and pelvis was performed using the standard protocol following bolus administration of intravenous contrast. CONTRAST:  21mL OMNIPAQUE IOHEXOL 300 MG/ML  SOLN COMPARISON:  CT 06/16/2019 FINDINGS: Lower chest: Small pericardial effusion, diminished from prior exam. Pacer leads terminate at the cardiac apex and right atrium. Cardiac size is borderline enlarged though similar to comparison prior. Scarring and atelectatic changes in the left lung base adjacent a large hiatal hernia further detailed below. Some additional atelectasis noted in the posterior right lower lobe. Few scattered calcified noncalcified lower lobe micro nodules are not significantly changed from prior. Hepatobiliary: Few stable subcentimeter hypoattenuating foci throughout the liver, too small to fully characterize on CT imaging but statistically likely benign. A slightly larger 11 mm hypodense focus towards the hepatic dome, probable cyst. Gallbladder is unremarkable. No significant biliary ductal dilatation accounting for typical senescent changes. No visible intraductal gallstone. Pancreas: No pancreatic ductal dilatation or surrounding inflammatory changes. Spleen: Normal in size. No concerning splenic lesions. Adrenals/Urinary Tract: No concerning adrenal nodules or masses. Kidneys demonstrate symmetric enhancementand excretion. Scattered subcentimeter hypoattenuating foci throughout both kidneys too small to fully characterize on CT imaging but statistically likely benign. No suspicious renal lesion, urolithiasis or hydronephrosis. Urinary bladder is unremarkable for the degree of  distention. Stomach/Bowel: Large hiatal hernia with what appears to be some organoaxial rotation similar to prior exam albeit with some increased gastric distention by fluid air and ingested material. Small amount of low-attenuation free fluid is seen adjacent the herniated stomach (2/12). No extraluminal gas, organized collection or abscess is seen. Some mild mucosal hyperemia within the herniated gastric segments and gastric antrum are nonspecific. Duodenum with a normal sweep across the  midline abdomen. Distal small bowel appears diffusely fluid-filled with several segments demonstrating some conspicuous mural thickening in the right lower quadrant (2/37). There are few clustered transition points noted in the right lower quadrant with displaced small bowel and associated hazy mesenteric changes (2/51) concerning for an obstructive, internal hernia involving the distal ileum with decompression to the level of the ileocecal valve in the right lower quadrant. Normal appendix. Moderate colonic stool burden. Extensive pancolonic diverticulosis most pronounced in the more distal segments. No significant colonic thickening accounting for underdistention. Vascular/Lymphatic: Atherosclerotic calcifications within the abdominal aorta and branch vessels. No aneurysm or ectasia. Extensive aortic tortuosity similar to prior. Some congested lymph nodes noted within the right lower quadrant as well as additional reactive nodes in the central mesentery. Reproductive: Uterus is surgically absent. No concerning adnexal lesions. Other: Small volume of free fluid seen in the deep pelvis. Hazy mesenteric stranding in the right lower quadrant associated with the deviated small bowel loops. Additional trace fluid in the left upper quadrant, upper retroperitoneum and adjacent the herniated proximal stomach. No free air. No other bowel containing hernia. Musculoskeletal: Severe levocurvature of the thoracolumbar junction and  dextrocurvature of the lower lumbar levels. Transitional lumbosacral vertebrae. Configuration is stable from priors. Multilevel discogenic and facet degenerative change with additional degenerative features in the hips and pelvis. No acute osseous abnormality or suspicious osseous lesion. IMPRESSION: 1. Appearance concerning for a developing small bowel obstruction secondary to what is likely an internal herniation of the distal ileum. Extensive hazy mesenteric stranding around the deviated loops of small bowel present in the right lower quadrant, as well as thickening of several of the obstructed, fluid-filled loops. Could reflect developing vascular or lymphatic compromise. 2. Large hiatal hernia. Small amount of nonspecific mucosal hyperemia as well as some free fluid adjacent the herniated gastric body. No extraluminal gas to suggest a frank perforation. Fluid may be reactive or redistributed from the more distal small bowel process. Considered decompression and possible direct visualization as clinically available. 3. Diverticulosis without evidence of acute diverticulitis. 4. Cardiomegaly and pericardial effusion. 5.  Aortic Atherosclerosis (ICD10-I70.0). 6. Severe scoliosis and associated chest wall deformity. 7. Several scattered calcified noncalcified pulmonary nodules without significant interval change. Electronically Signed: By: Lovena Le M.D. On: 12/10/2020 04:34    EKG: Independently reviewed.  NSR with rate 63; no evidence of acute ischemia   Labs on Admission: I have personally reviewed the available labs and imaging studies at the time of the admission.  Pertinent labs:   Glucose 145 Lactate 0.9 WBC 16.3 COVID/flu negative   Assessment/Plan Principal Problem:   SBO (small bowel obstruction) (HCC) Active Problems:   Hypothyroidism   Essential hypertension   Pacemaker   Grade 3 follicular lymphoma of lymph nodes of neck (HCC)   PAD (peripheral artery disease) (HCC)    SBO -Patient without prior h/o abdominal surgeries (vaginal partial hysterectomy only) presenting with acute onset of abdominal pain with n/v, abdominal distention, and CT findings c/w SBO likely associated with internal herniation of the distal ileum -Imaging also suggests developing vascular or lymphatic compromise -Will admit on Med Surg -Gen Surg consulted by ER; currently no indication for surgical intervention -80% of SBO will resolve without surgery -High-grade SBO can usually be safely managed non-operatively -If PO contrast reaches colon within 24 hours, SBO will very likely certainly resolve without surgery -NPO for bowel rest -NG tube attempted x 6 without success; will place order for placement under fluoroscopy -IVF hydration -Pain control with  dilaudid -Current guidelines recommend that patients without resolution undergo surgery by 3-5 days  CVID -Treated with IVIG and denosumab -Often has GI symptoms but current presentation is different from this  NHL -recurrent, last seen by Dr. Julien Nordmann in 05/2020 with no further concern for recurrence/mets  PAD -Last seen by Dr. Stanford Breed in 05/2020 and was to start statin therapy and ASA - neither of these is listed on her Med Rec  Pacemaker placement -Patient is concerned about her pacemaker and thinks it may need interrogation -Cardiology input requested -Currently low suspicion for cardiac issue leading to presentation  Afib -Rate control with standing and prn IV Lopressor for now -She does not appear to be taking AC at this time  HTN -Will cover with IV Lopressor for now -Resume PO Lisinopril and Lopressor when able to tolerate PO  Hypothyroidism -Resume Synthroid at current dose when able to tolerate PO     Note: This patient has been tested and is negative for the novel coronavirus COVID-19. The patient has been fully vaccinated against COVID-19.   Level of care: Med-Surg DVT prophylaxis: SCDs Code Status:   Full - confirmed with patient/family Family Communication: daughter was present throughout evaluation. Disposition Plan:  The patient is from: home  Anticipated d/c is to: home without [facility] services   Anticipated d/c date will depend on clinical response to treatment, likely several days  Patient is currently: acutely ill Consults called: Surgery; IR  Admission status:  Admit - It is my clinical opinion that admission to INPATIENT is reasonable and necessary because of the expectation that this patient will require hospital care that crosses at least 2 midnights to treat this condition based on the medical complexity of the problems presented.  Given the aforementioned information, the predictability of an adverse outcome is felt to be significant.    Karmen Bongo MD [facility]   How to contact the [facility] Attending or Consulting provider [place] or covering provider during after hours [place], for this patient?  Check the care team in [facility] and look for a) attending/consulting [facility] provider listed and b) the [facility] team listed Log into [email or web address] and use [place]'s universal password to access. If you do not have the password, please contact the hospital operator. Locate the [facility] provider you are looking for under [facility] and page to a number that you can be directly reached. If you still have difficulty reaching the provider, please page the [facility] (Director on Call) for the Hospitalists listed on amion for assistance.   12/10/2020, 2:17 PM

## 2020-12-10 NOTE — Transfer of Care (Signed)
Immediate Anesthesia Transfer of Care Note  Patient: [name]  Procedure(s) Performed: ESOPHAGOGASTRODUODENOSCOPY (EGD) WITH PROPOFOL  Patient Location: PACU  Anesthesia Type:General  Level of Consciousness: drowsy and patient cooperative  Airway & Oxygen Therapy: Patient Spontanous Breathing and Patient connected to nasal cannula oxygen  Post-op Assessment: Report given to RN, Post -op Vital signs reviewed and stable and Patient moving all extremities  Post vital signs: Reviewed and stable  Last Vitals:  Vitals Value Taken Time  BP    Temp    Pulse    Resp    SpO2      Last Pain:  Vitals:   12/10/20 1718  TempSrc: Temporal  PainSc: 0-No pain         Complications: No notable events documented.

## 2020-12-10 NOTE — ED Notes (Signed)
Got patient on the monitor patient is resting with family T [place]

## 2020-12-10 NOTE — ED Notes (Signed)
Patients oxygen saturation decreased to 78-85% on room air. [place] placed on the patient.

## 2020-12-10 NOTE — ED Notes (Signed)
D/t SpO2 decreasing to 82% following pain med administration, pt placed on 2L [place]. RN notified. SpO2 currently 94%

## 2020-12-10 NOTE — Op Note (Signed)
[facility] Patient Name: [name] Procedure Date : 12/10/2020 MRN: [id number] Attending MD: Ronnette Juniper , MD Date of Birth: August 19, 1934 CSN: [id number] Age: 86 Admit Type: Inpatient Procedure:                Upper GI endoscopy Indications:              SBO, inability to pass NG tube at bedside by RN or                            via IR under fluroscopy Providers:                Ronnette Juniper, MD, Elmer Ramp. Tilden Dome, RN, Dulcy Fanny, Tyrone Apple, Technician, Claybon Jabs CRNA, CRNA Referring MD:             [facility] Medicines:                Monitored Anesthesia Care Complications:            No immediate complications. Estimated Blood Loss:     Estimated blood loss: none. Procedure:                Pre-Anesthesia Assessment:                           - Prior to the procedure, a History and Physical                            was performed, and patient medications and                            allergies were reviewed. The patient's tolerance of                            previous anesthesia was also reviewed. The risks                            and benefits of the procedure and the sedation                            options and risks were discussed with the patient.                            All questions were answered, and informed consent                            was obtained. Prior Anticoagulants: The patient has                            taken no previous anticoagulant or antiplatelet  agents. ASA Grade Assessment: III - A patient with                            severe systemic disease. After reviewing the risks                            and benefits, the patient was deemed in                            satisfactory condition to undergo the procedure.                           After obtaining informed consent, the endoscope was                            passed  under direct vision. Throughout the                            procedure, the patient's blood pressure, pulse, and                            oxygen saturations were monitored continuously. The                            GIF-H190 ([id number] Olympus gastroscope was                            introduced through the mouth, and advanced to the                            second part of duodenum. The upper GI endoscopy was                            technically difficult and complex due to abnormal                            anatomy, poor endoscopic visualization and                            significant looping. The patient tolerated the                            procedure well. Scope In: Scope Out: Findings:      The middle third of the esophagus and lower third of the esophagus were       significantly tortuous.      A large amount of food (residue) was found in the cardia, in the gastric       fundus and in the gastric body.      Cardia and fundus could not be evaluated due to large food residue,       liquid and solid.      The gastric antrum and pylorus were normal.      The examined duodenum was normal.      NG tube was advanced into the left nares and the  tip was noted in the       area of vocal cords. The tube was advanced gently into the esophagus,       but could not be advanced beyond the distal esophagus into the gastric       cavity likely due to presence of a large hiatal hernia(noted on CT) and       tortuous esophagus.      Due to presence of large amount of fluid and retained food,       visualization was very limited.      A snare was used to grap the tip of the NG tube, which was eventually       advanced into the gastric cavity with the adult gastroscope.      After the postion was comfirmed, the scope was withdrawn gently, keeping       the NG tube in a good position within the gastric cavity. Impression:               - Tortuous esophagus.                            - A large amount of food (residue) in the stomach.                           - Normal antrum and pylorus.                           - Normal examined duodenum.                           - No specimens collected.                           - NG tube successfully placed in the gastric cavity. Moderate Sedation:      Patient did not receive moderate sedation for this procedure, but       instead received monitored anesthesia care. Recommendation:           - NPO.                           - Keep NG tube to low intermittent wall suction.                           - Management of SBO as per surgical team. Procedure Code(s):        --- Professional ---                           [phone], Esophagogastroduodenoscopy, flexible,                            transoral; diagnostic, including collection of                            specimen(s) by brushing or washing, when performed                            (separate procedure) Diagnosis Code(s):        --- Professional ---  Q39.9, Congenital malformation of esophagus,                            unspecified CPT copyright 2019 [employer]. All rights reserved. The codes documented in this report are preliminary and upon coder review may  be revised to meet current compliance requirements. Ronnette Juniper, MD 12/10/2020 6:35:37 PM This report has been signed electronically. Number of Addenda: 0

## 2020-12-10 NOTE — Consult Note (Addendum)
[facility] Surgery Consult Note  [name] 1934/10/11  [id number].    Requesting MD: Regenia Skeeter, MD Chief Complaint/Reason for Consult: SBO  HPI:  Ms. [name] is an 86 y/o F who presented to [facility] with a cc acute onset abdominal pain. Pain described as sharp, cramping, non-radiating abdominal pain that started at 8:30 PM yesterday. Denies similar pain in the past. Reports taking tylenol and gabapentin without relief of symptoms. Associated sxs include nausea and vomiting that started around 11:30 PM. Denies flatus since earlier in the day yesterday. Last reported BM was at 9:30 PM yesterday. She also has a history of hiatal hernia which she states has been present for a long time and does not cause her symptoms - denies regular history of nausea, vomiting, significant indigestion or upper abd pain/chest pain from this.   Her other medical problems include HTN, hypothyroidism, a.fib not on anticoagulation, pacemaker placement, NHL in remission.  Surgical history: reports history of partial hysterectomy in 1980's Substance: very occasional alcohol use. Denies tobacco or other illicit drug use Social: lives alone in [place], [place]. States she is independent of ADL's. Mobilizes without an assistive device. Drives a car.   ROS: Review of Systems  Constitutional: Negative.   HENT: Negative.    Eyes: Negative.   Respiratory: Negative.    Cardiovascular: Negative.   Gastrointestinal:  Positive for abdominal pain, nausea and vomiting. Negative for blood in stool and melena.  Genitourinary: Negative.   Musculoskeletal: Negative.   Skin: Negative.   Neurological: Negative.   Endo/Heme/Allergies: Negative.   Psychiatric/Behavioral: Negative.    All other systems reviewed and are negative.  Family History  Problem Relation Age of Onset   Stroke Mother    Stroke Father    Heart disease Daughter        congenital "whole in her heart"   Lymphoma Daughter         nonhodgkins   Breast cancer Sister    Heart disease Brother    Colon cancer Neg Hx    Esophageal cancer Neg Hx    Pancreatic cancer Neg Hx    Stomach cancer Neg Hx    Liver disease Neg Hx     Past Medical History:  Diagnosis Date   Anemia    pt denies   Arthritis    Atrial fibrillation ([place])    Cataract    bil   Cystocele    history with rectocele, pt denies   GERD (gastroesophageal reflux disease)    H/O hiatal hernia    Hypertension    Hypothyroidism    Non Hodgkin's lymphoma ([place])    dx 2012 and now in remission   Osteoporosis    Pacemaker 09/15/2011   Medtronic Revo   Pericarditis    Pleural effusion, bilateral    history of   Pulmonary nodules    history of   Scoliosis    Varicose vein     Past Surgical History:  Procedure Laterality Date   BREAST SURGERY  07/30/01   biopsy x 3, bilateral   CATARACT EXTRACTION Bilateral    COLONOSCOPY     MASS EXCISION Right 10/28/2012   Procedure: Removal of mass on neck       ;  Surgeon: Adin Hector, MD;  Location: [facility];  Service: General;  Laterality: Right;   PARTIAL HYSTERECTOMY  04/29/2004   PERMANENT PACEMAKER INSERTION N/A 09/15/2011   Procedure: PERMANENT PACEMAKER INSERTION;  Surgeon: Sanda Klein, MD;  Location: [facility]  CATH LAB;  Service: Cardiovascular;  Laterality: N/A;   RADIOACTIVE SEED GUIDED EXCISIONAL BREAST BIOPSY Left 08/11/2017   Procedure: LEFT RADIOACTIVE SEED GUIDED EXCISIONAL BREAST BIOPSY ERAS PATHWAY;  Surgeon: Stark Klein, MD;  Location: [facility];  Service: General;  Laterality: Left;   TONSILLECTOMY      Social History:  reports that she has never smoked. She has never used smokeless tobacco. She reports current alcohol use. She reports that she does not use drugs.  Allergies:  Allergies  Allergen Reactions   Amoxicillin Hives    Has patient had a PCN reaction causing immediate rash, facial/tongue/throat swelling, SOB or lightheadedness with hypotension: No Has patient had a PCN reaction  causing severe rash involving mucus membranes or skin necrosis: No Has patient had a PCN reaction that required hospitalization: No Has patient had a PCN reaction occurring within the last 10 years: No If all of the above answers are "NO", then may proceed with Cephalosporin use.    Buprenorphine Hives   Codeine Hives    REACTION: "makes her crazy" Hives   Demerol Hives   Morphine And Related Hives   Sulfonamide Derivatives Rash    REACTION: Rash   Vicodin [Hydrocodone-Acetaminophen] Hives    (Not in a hospital admission)   Blood pressure 125/75, pulse 61, temperature 98.1 F (36.7 C), temperature source Oral, resp. rate 17, height 5\' 3"  (1.6 m), weight 63.5 kg, SpO2 95 %. Physical Exam: Constitutional: NAD; conversant; no deformities Eyes: Moist conjunctiva; no lid lag; anicteric; PERRL Neck: Trachea midline; no thyromegaly Lungs: Normal respiratory effort; no tactile fremitus CV: RRR; no palpable thrills; no pitting edema GI: Abd soft, mildly distended, globally tender without peritonitis, hypoactive BS, no palpable hepatosplenomegaly MSK: symmetrical, no clubbing/cyanosis Psychiatric: Appropriate affect; alert and oriented x3 Lymphatic: No palpable cervical or axillary lymphadenopathy Neuro: no focal deficit, following commands, moving all extremities spontaneously    Results for orders placed or performed during the hospital encounter of 12/10/20 (from the past 48 hour(s))  CBC with Differential     Status: Abnormal   Collection Time: 12/10/20  3:04 AM  Result Value Ref Range   WBC 16.3 (H) 4.0 - 10.5 K/uL   RBC 4.60 3.87 - 5.11 MIL/uL   Hemoglobin 14.7 12.0 - 15.0 g/dL   HCT 42.6 36.0 - 46.0 %   MCV 92.6 80.0 - 100.0 fL   MCH 32.0 26.0 - 34.0 pg   MCHC 34.5 30.0 - 36.0 g/dL   RDW 13.2 11.5 - 15.5 %   Platelets 248 150 - 400 K/uL   nRBC 0.0 0.0 - 0.2 %   Neutrophils Relative % 82 %   Neutro Abs 13.6 (H) 1.7 - 7.7 K/uL   Lymphocytes Relative 13 %   Lymphs Abs 2.1  0.7 - 4.0 K/uL   Monocytes Relative 3 %   Monocytes Absolute 0.5 0.1 - 1.0 K/uL   Eosinophils Relative 0 %   Eosinophils Absolute 0.0 0.0 - 0.5 K/uL   Basophils Relative 1 %   Basophils Absolute 0.1 0.0 - 0.1 K/uL   Immature Granulocytes 1 %   Abs Immature Granulocytes 0.09 (H) 0.00 - 0.07 K/uL    Comment: Performed at [facility], [place], [place] [address]  Comprehensive metabolic panel     Status: Abnormal   Collection Time: 12/10/20  3:04 AM  Result Value Ref Range   Sodium 141 135 - 145 mmol/L   Potassium 3.6 3.5 - 5.1 mmol/L   Chloride 104  98 - 111 mmol/L   CO2 23 22 - 32 mmol/L   Glucose, Bld 145 (H) 70 - 99 mg/dL    Comment: Glucose reference range applies only to samples taken after fasting for at least 8 hours.   BUN 14 8 - 23 mg/dL   Creatinine, Ser 0.73 0.44 - 1.00 mg/dL   Calcium 10.3 8.9 - 10.3 mg/dL   Total Protein 7.9 6.5 - 8.1 g/dL   Albumin 4.9 3.5 - 5.0 g/dL   AST 24 15 - 41 U/L   ALT 16 0 - 44 U/L   Alkaline Phosphatase 64 38 - 126 U/L   Total Bilirubin 0.6 0.3 - 1.2 mg/dL   GFR, Estimated >60 >60 mL/min    Comment: (NOTE) Calculated using the CKD-EPI Creatinine Equation (2021)    Anion gap 14 5 - 15    Comment: Performed at [facility], [address], [place], [place] [address]  Lipase, blood     Status: None   Collection Time: 12/10/20  3:04 AM  Result Value Ref Range   Lipase 19 11 - 51 U/L    Comment: Performed at [facility], [address], [place],  [address]  Resp Panel by RT-PCR (Flu A&B, Covid) Nasopharyngeal Swab     Status: None   Collection Time: 12/10/20  6:04 AM   Specimen: Nasopharyngeal Swab; Nasopharyngeal(NP) swabs in vial transport medium  Result Value Ref Range   SARS Coronavirus 2 by RT PCR NEGATIVE NEGATIVE    Comment: (NOTE) SARS-CoV-2 target nucleic acids are NOT DETECTED.  The SARS-CoV-2 RNA is generally detectable in upper  respiratory specimens during the acute phase of infection. The lowest concentration of SARS-CoV-2 viral copies this assay can detect is 138 copies/mL. A negative result does not preclude SARS-Cov-2 infection and should not be used as the sole basis for treatment or other patient management decisions. A negative result may occur with  improper specimen collection/handling, submission of specimen other than nasopharyngeal swab, presence of viral mutation(s) within the areas targeted by this assay, and inadequate number of viral copies(<138 copies/mL). A negative result must be combined with clinical observations, patient history, and epidemiological information. The expected result is Negative.  Fact Sheet for Patients:  EntrepreneurPulse.com.au  Fact Sheet for Healthcare Providers:  IncredibleEmployment.be  This test is no t yet approved or cleared by the [place] FDA and  has been authorized for detection and/or diagnosis of SARS-CoV-2 by FDA under an Emergency Use Authorization (EUA). This EUA will remain  in effect (meaning this test can be used) for the duration of the COVID-19 declaration under Section 564(b)(1) of the Act, 21 U.S.C.section 360bbb-3(b)(1), unless the authorization is terminated  or revoked sooner.       Influenza A by PCR NEGATIVE NEGATIVE   Influenza B by PCR NEGATIVE NEGATIVE    Comment: (NOTE) The Xpert Xpress SARS-CoV-2/FLU/RSV plus assay is intended as an aid in the diagnosis of influenza from Nasopharyngeal swab specimens and should not be used as a sole basis for treatment. Nasal washings and aspirates are unacceptable for Xpert Xpress SARS-CoV-2/FLU/RSV testing.  Fact Sheet for Patients: EntrepreneurPulse.com.au  Fact Sheet for Healthcare Providers: IncredibleEmployment.be  This test is not yet approved or cleared by the [place] FDA and has been authorized for  detection and/or diagnosis of SARS-CoV-2 by FDA under an Emergency Use Authorization (EUA). This EUA will remain in effect (meaning this test can be used) for the duration of the COVID-19 declaration under Section  564(b)(1) of the Act, 21 U.S.C. section 360bbb-3(b)(1), unless the authorization is terminated or revoked.  Performed at [facility], [address], [place], [place] [address]   Lactic acid, plasma     Status: None   Collection Time: 12/10/20  6:04 AM  Result Value Ref Range   Lactic Acid, Venous 0.9 0.5 - 1.9 mmol/L    Comment: Performed at [facility], [address], [place], [place] [address]   CT ABDOMEN PELVIS W CONTRAST  Addendum Date: 12/10/2020   ADDENDUM REPORT: 12/10/2020 04:50 ADDENDUM: These results were called by telephone at the time of physician contact on 12/10/2020 at 4:50 am to provider [facility] , who verbally acknowledged these results. Electronically Signed   By: Lovena Le M.D.   On: 12/10/2020 04:50   Result Date: 12/10/2020 CLINICAL DATA:  Abdominal pain and cramping with emesis since last night EXAM: CT ABDOMEN AND PELVIS WITH CONTRAST TECHNIQUE: Multidetector CT imaging of the abdomen and pelvis was performed using the standard protocol following bolus administration of intravenous contrast. CONTRAST:  33mL OMNIPAQUE IOHEXOL 300 MG/ML  SOLN COMPARISON:  CT 06/16/2019 FINDINGS: Lower chest: Small pericardial effusion, diminished from prior exam. Pacer leads terminate at the cardiac apex and right atrium. Cardiac size is borderline enlarged though similar to comparison prior. Scarring and atelectatic changes in the left lung base adjacent a large hiatal hernia further detailed below. Some additional atelectasis noted in the posterior right lower lobe. Few scattered calcified noncalcified lower lobe micro nodules are not significantly changed from prior. Hepatobiliary: Few stable subcentimeter hypoattenuating foci  throughout the liver, too small to fully characterize on CT imaging but statistically likely benign. A slightly larger 11 mm hypodense focus towards the hepatic dome, probable cyst. Gallbladder is unremarkable. No significant biliary ductal dilatation accounting for typical senescent changes. No visible intraductal gallstone. Pancreas: No pancreatic ductal dilatation or surrounding inflammatory changes. Spleen: Normal in size. No concerning splenic lesions. Adrenals/Urinary Tract: No concerning adrenal nodules or masses. Kidneys demonstrate symmetric enhancementand excretion. Scattered subcentimeter hypoattenuating foci throughout both kidneys too small to fully characterize on CT imaging but statistically likely benign. No suspicious renal lesion, urolithiasis or hydronephrosis. Urinary bladder is unremarkable for the degree of distention. Stomach/Bowel: Large hiatal hernia with what appears to be some organoaxial rotation similar to prior exam albeit with some increased gastric distention by fluid air and ingested material. Small amount of low-attenuation free fluid is seen adjacent the herniated stomach (2/12). No extraluminal gas, organized collection or abscess is seen. Some mild mucosal hyperemia within the herniated gastric segments and gastric antrum are nonspecific. Duodenum with a normal sweep across the midline abdomen. Distal small bowel appears diffusely fluid-filled with several segments demonstrating some conspicuous mural thickening in the right lower quadrant (2/37). There are few clustered transition points noted in the right lower quadrant with displaced small bowel and associated hazy mesenteric changes (2/51) concerning for an obstructive, internal hernia involving the distal ileum with decompression to the level of the ileocecal valve in the right lower quadrant. Normal appendix. Moderate colonic stool burden. Extensive pancolonic diverticulosis most pronounced in the more distal segments. No  significant colonic thickening accounting for underdistention. Vascular/Lymphatic: Atherosclerotic calcifications within the abdominal aorta and branch vessels. No aneurysm or ectasia. Extensive aortic tortuosity similar to prior. Some congested lymph nodes noted within the right lower quadrant as well as additional reactive nodes in the central mesentery. Reproductive: Uterus is surgically absent. No concerning adnexal lesions. Other: Small volume of free  fluid seen in the deep pelvis. Hazy mesenteric stranding in the right lower quadrant associated with the deviated small bowel loops. Additional trace fluid in the left upper quadrant, upper retroperitoneum and adjacent the herniated proximal stomach. No free air. No other bowel containing hernia. Musculoskeletal: Severe levocurvature of the thoracolumbar junction and dextrocurvature of the lower lumbar levels. Transitional lumbosacral vertebrae. Configuration is stable from priors. Multilevel discogenic and facet degenerative change with additional degenerative features in the hips and pelvis. No acute osseous abnormality or suspicious osseous lesion. IMPRESSION: 1. Appearance concerning for a developing small bowel obstruction secondary to what is likely an internal herniation of the distal ileum. Extensive hazy mesenteric stranding around the deviated loops of small bowel present in the right lower quadrant, as well as thickening of several of the obstructed, fluid-filled loops. Could reflect developing vascular or lymphatic compromise. 2. Large hiatal hernia. Small amount of nonspecific mucosal hyperemia as well as some free fluid adjacent the herniated gastric body. No extraluminal gas to suggest a frank perforation. Fluid may be reactive or redistributed from the more distal small bowel process. Considered decompression and possible direct visualization as clinically available. 3. Diverticulosis without evidence of acute diverticulitis. 4. Cardiomegaly and  pericardial effusion. 5.  Aortic Atherosclerosis (ICD10-I70.0). 6. Severe scoliosis and associated chest wall deformity. 7. Several scattered calcified noncalcified pulmonary nodules without significant interval change. Electronically Signed: By: Lovena Le M.D. On: 12/10/2020 04:34    Assessment/Plan SBO  - afebrile, VSS, WBC 16.3, lactic acid 0.9  - PMH partial hysterectomy 1980's - CT A/P W/ Contrast 6/14 significant for SBO with possible transition point in the distal ileum, also has a large hiatal hernia. CT raises the concern for an internal hernia, I have a low suspicion for this based on her normal vitals, benign abdominal exam, normal lactate and electrolytes, and surgical history limited to hysterectomy.  - Place NG tube, SBO protocol  - no urgent/emergent indication for surgery at this time.   Per primary team: HTN Hypothyroidism A.fib Permanent pacemaker placement 2013 PAD NHL in remission    Jill Alexanders, [facility] Surgery Please see Amion for pager number during day hours 7:00am-4:30pm 12/10/2020, 10:08 AM

## 2020-12-10 NOTE — ED Provider Notes (Signed)
[facility] EMERGENCY DEPT Provider Note   CSN: [id number] Arrival date & time: 12/10/20  0250     History Chief Complaint  Patient presents with   Emesis   Abdominal Pain    [name] is a 86 y.o. female.  HPI     This is an 86 year old female with a history of atrial fibrillation, reflux, hiatal hernia, hypertension who presents with abdominal pain and vomiting.  Onset of symptoms last night around 830.  Patient reports that she did not eat much for dinner.  She has had multiple episodes of nonbilious, nonbloody emesis.  She reports crampy diffuse abdominal pain that is nonradiating.  She rates her pain at 10 out of 10.  She tried to take an extra Prilosec with minimal relief.  No known sick contacts or COVID exposures.  Denies significant surgical abdominal history.  Past Medical History:  Diagnosis Date   Anemia    pt denies   Arthritis    Atrial fibrillation ([place])    Cataract    bil   Cystocele    history with rectocele, pt denies   Dysrhythmia    GERD (gastroesophageal reflux disease)    H/O hiatal hernia    Hemorrhoid    History of pancreatitis    pt denies and denies as of today 08/09/2017   Hypertension    Hypothyroidism    Non Hodgkin's lymphoma ([place])    dx 2012 and now in remission   Osteoporosis    Pacemaker 09/15/2011   Medtronic Revo   Pericarditis    Pleural effusion, bilateral    history of   Pulmonary nodules    history of   Scoliosis    Varicose vein     Patient Active Problem List   Diagnosis Date Noted   Atherosclerosis of native arteries of extremity with intermittent claudication ([place]) 06/25/2020   Tear of meniscus of knee 05/29/2020   Primary osteoarthritis of left knee 09/22/2019   Postoperative atrial fibrillation ([place]) 01/14/2018   Vasovagal syncope 04/09/2017   Right shoulder injury, initial encounter 02/25/2017   Hypertropia of left eye 09/09/2016   Monocular esotropia of left eye 09/09/2016   Chronic  diastolic heart failure ([place]) 05/29/2016   History of pericarditis 05/29/2016   Sinus node dysfunction ([place]) 02/05/2016   Insomnia 38/03/1750   Grade 3 follicular lymphoma of lymph nodes of neck ([place]) 06/06/2015   Peripheral neuropathy 12/26/2014   Heart palpitations 06/04/2014   Cyst in hand 03/14/2014   Hearing loss 04/27/2013   Hip pain 04/27/2013   Need for prophylactic vaccination and inoculation against influenza 04/27/2013   Multinodular goiter 02/21/2013   Pacemaker 02/11/2013   Pericardial effusion after pacemaker, hospitalized at [facility] 09/25/11 09/29/2011   Dyslipidemia, LDL 136 08/21/2011   Hiatal hernia, large by CXR 08/21/2011   Scoliosis 08/21/2011   NHL (non-Hodgkin's lymphoma)-recent diagnosis 07/09/2011   Anxiety 07/09/2011   Sinus bradycardia, symptomatic, s/p MDT PTVDP 09/15/11 02/11/2011   Essential hypertension 01/26/2011   Osteoporosis 02/58/5277   DIASTOLIC DYSFUNCTION, on 2D Jan 2011 (grade 1) 08/13/2009   PAP SMEAR, ABNORMAL 07/04/2009   BACK PAIN 12/07/2008   Hypothyroidism 10/30/2008   ADJUSTMENT DISORDER WITH DEPRESSED MOOD 10/30/2008   GERD 10/30/2008    Past Surgical History:  Procedure Laterality Date   BREAST SURGERY  07/30/01   biopsy x 3, bilateral   CATARACT EXTRACTION Bilateral    COLONOSCOPY     MASS EXCISION Right 10/28/2012   Procedure: Removal of mass on  neck       ;  Surgeon: Adin Hector, MD;  Location: [facility];  Service: General;  Laterality: Right;   PARTIAL HYSTERECTOMY  04/29/2004   PERMANENT PACEMAKER INSERTION N/A 09/15/2011   Procedure: PERMANENT PACEMAKER INSERTION;  Surgeon: Sanda Klein, MD;  Location: [facility] CATH LAB;  Service: Cardiovascular;  Laterality: N/A;   RADIOACTIVE SEED GUIDED EXCISIONAL BREAST BIOPSY Left 08/11/2017   Procedure: LEFT RADIOACTIVE SEED GUIDED EXCISIONAL BREAST BIOPSY ERAS PATHWAY;  Surgeon: Stark Klein, MD;  Location: [facility];  Service: General;  Laterality: Left;   TONSILLECTOMY       OB  History   No obstetric history on file.     Family History  Problem Relation Age of Onset   Stroke Mother    Stroke Father    Heart disease Daughter        congenital "whole in her heart"   Lymphoma Daughter        nonhodgkins   Breast cancer Sister    Heart disease Brother    Colon cancer Neg Hx    Esophageal cancer Neg Hx    Pancreatic cancer Neg Hx    Stomach cancer Neg Hx    Liver disease Neg Hx     Social History   Tobacco Use   Smoking status: Never   Smokeless tobacco: Never  Vaping Use   Vaping Use: Never used  Substance Use Topics   Alcohol use: Yes    Comment: 1 glass of wine occasionally   Drug use: No    Home Medications Prior to Admission medications   Medication Sig Start Date End Date Taking? Authorizing Provider  acetaminophen (TYLENOL) 500 MG tablet Take 500 mg by mouth every 6 (six) hours as needed (for pain.).     [provider]  Calcium Carbonate-Vitamin D 600-400 MG-UNIT tablet Take 1 tablet by mouth 2 (two) times daily.    [provider]  denosumab (PROLIA) 60 MG/ML SOLN injection Inject 60 mg into the skin every 6 (six) months. Reported on 10/16/2015    [provider]  gabapentin (NEURONTIN) 300 MG capsule Take 1 capsule (300 mg total) by mouth 4 (four) times daily. 10/09/20 04/23/21  Janith Lima, MD  levothyroxine (SYNTHROID) 100 MCG tablet Take 1 tablet (100 mcg total) by mouth daily. 10/09/20   Janith Lima, MD  lisinopril (ZESTRIL) 20 MG tablet Take 1 tablet (20 mg total) by mouth in the morning and at bedtime. 10/09/20   Janith Lima, MD  Magnesium Oxide (MAG-OX 400 PO) Take 1 tablet by mouth daily.    [provider]  metoprolol tartrate (LOPRESSOR) 25 MG tablet TAKE 1 TABLET(25 MG) BY MOUTH DAILY. MAY TAKE 1/2 TABLET EXTRA IF SYSTOLIC BLOOD PRESSURE IS ABOVE 160 01/10/20   Croitoru, Mihai, MD  olopatadine (PATANOL) 0.1 % ophthalmic solution Place 1 drop into both eyes 2 times daily. 05/08/20    Marrian Salvage, FNP  omeprazole (PRILOSEC) 40 MG capsule TAKE 1 CAPSULE(40 MG) BY MOUTH DAILY 01/24/20   Marrian Salvage, FNP  potassium chloride (KLOR-CON) 10 MEQ tablet TAKE 1 TABLET(10 MEQ) BY MOUTH DAILY 06/24/20   Croitoru, Mihai, MD    Allergies    Amoxicillin, Buprenorphine, Codeine, Demerol, Morphine and related, Sulfonamide derivatives, and Vicodin [hydrocodone-acetaminophen]  Review of Systems   Review of Systems  Constitutional:  Negative for fever.  Respiratory:  Negative for shortness of breath.   Cardiovascular:  Negative for chest pain.  Gastrointestinal:  Positive  for abdominal pain, nausea and vomiting. Negative for constipation and diarrhea.  Genitourinary:  Negative for dysuria.  All other systems reviewed and are negative.  Physical Exam Updated Vital Signs BP 107/67   Pulse 60   Temp 98.5 F (36.9 C) (Oral)   Resp 13   Ht 1.6 m (5\' 3" )   Wt 63.5 kg   SpO2 97%   BMI 24.80 kg/m   Physical Exam Vitals and nursing note reviewed.  Constitutional:      Appearance: She is well-developed. She is not ill-appearing.     Comments: Elderly, nontoxic-appearing  HENT:     Head: Normocephalic and atraumatic.  Eyes:     Pupils: Pupils are equal, round, and reactive to light.  Cardiovascular:     Rate and Rhythm: Normal rate and regular rhythm.     Heart sounds: Normal heart sounds.     Comments: Pacer palpated Pulmonary:     Effort: Pulmonary effort is normal. No respiratory distress.     Breath sounds: No wheezing.  Abdominal:     General: Bowel sounds are decreased.     Palpations: Abdomen is soft.     Tenderness: There is generalized abdominal tenderness. There is no guarding or rebound.  Musculoskeletal:     Cervical back: Neck supple.  Skin:    General: Skin is warm and dry.  Neurological:     Mental Status: She is alert and oriented to person, place, and time.  Psychiatric:        Mood and Affect: Mood normal.    ED Results /  Procedures / Treatments   Labs (all labs ordered are listed, but only abnormal results are displayed) Labs Reviewed  CBC WITH DIFFERENTIAL/PLATELET - Abnormal; Notable for the following components:      Result Value   WBC 16.3 (*)    Neutro Abs 13.6 (*)    Abs Immature Granulocytes 0.09 (*)    All other components within normal limits  COMPREHENSIVE METABOLIC PANEL - Abnormal; Notable for the following components:   Glucose, Bld 145 (*)    All other components within normal limits  RESP PANEL BY RT-PCR (FLU A&B, COVID) [id number]  LIPASE, BLOOD  URINALYSIS, ROUTINE W REFLEX MICROSCOPIC    EKG EKG Interpretation  Date/Time:  Tuesday December 10 2020 03:27:57 EDT Ventricular Rate:  63 PR Interval:  188 QRS Duration: 88 QT Interval:  474 QTC Calculation: 486 R Axis:   -36 Text Interpretation: Sinus rhythm Left axis deviation RSR' in V1 or V2, probably normal variant Borderline prolonged QT interval Confirmed by Thayer Jew [phone]) on 12/10/2020 5:26:22 AM  Radiology CT ABDOMEN PELVIS W CONTRAST  Addendum Date: 12/10/2020   ADDENDUM REPORT: 12/10/2020 04:50 ADDENDUM: These results were called by telephone at the time of physician contact on 12/10/2020 at 4:50 am to provider Concepcion Kirkpatrick , who verbally acknowledged these results. Electronically Signed   By: Lovena Le M.D.   On: 12/10/2020 04:50   Result Date: 12/10/2020 CLINICAL DATA:  Abdominal pain and cramping with emesis since last night EXAM: CT ABDOMEN AND PELVIS WITH CONTRAST TECHNIQUE: Multidetector CT imaging of the abdomen and pelvis was performed using the standard protocol following bolus administration of intravenous contrast. CONTRAST:  17mL OMNIPAQUE IOHEXOL 300 MG/ML  SOLN COMPARISON:  CT 06/16/2019 FINDINGS: Lower chest: Small pericardial effusion, diminished from prior exam. Pacer leads terminate at the cardiac apex and right atrium. Cardiac size is borderline enlarged though similar to comparison prior. Scarring and  atelectatic changes  in the left lung base adjacent a large hiatal hernia further detailed below. Some additional atelectasis noted in the posterior right lower lobe. Few scattered calcified noncalcified lower lobe micro nodules are not significantly changed from prior. Hepatobiliary: Few stable subcentimeter hypoattenuating foci throughout the liver, too small to fully characterize on CT imaging but statistically likely benign. A slightly larger 11 mm hypodense focus towards the hepatic dome, probable cyst. Gallbladder is unremarkable. No significant biliary ductal dilatation accounting for typical senescent changes. No visible intraductal gallstone. Pancreas: No pancreatic ductal dilatation or surrounding inflammatory changes. Spleen: Normal in size. No concerning splenic lesions. Adrenals/Urinary Tract: No concerning adrenal nodules or masses. Kidneys demonstrate symmetric enhancementand excretion. Scattered subcentimeter hypoattenuating foci throughout both kidneys too small to fully characterize on CT imaging but statistically likely benign. No suspicious renal lesion, urolithiasis or hydronephrosis. Urinary bladder is unremarkable for the degree of distention. Stomach/Bowel: Large hiatal hernia with what appears to be some organoaxial rotation similar to prior exam albeit with some increased gastric distention by fluid air and ingested material. Small amount of low-attenuation free fluid is seen adjacent the herniated stomach (2/12). No extraluminal gas, organized collection or abscess is seen. Some mild mucosal hyperemia within the herniated gastric segments and gastric antrum are nonspecific. Duodenum with a normal sweep across the midline abdomen. Distal small bowel appears diffusely fluid-filled with several segments demonstrating some conspicuous mural thickening in the right lower quadrant (2/37). There are few clustered transition points noted in the right lower quadrant with displaced small bowel and  associated hazy mesenteric changes (2/51) concerning for an obstructive, internal hernia involving the distal ileum with decompression to the level of the ileocecal valve in the right lower quadrant. Normal appendix. Moderate colonic stool burden. Extensive pancolonic diverticulosis most pronounced in the more distal segments. No significant colonic thickening accounting for underdistention. Vascular/Lymphatic: Atherosclerotic calcifications within the abdominal aorta and branch vessels. No aneurysm or ectasia. Extensive aortic tortuosity similar to prior. Some congested lymph nodes noted within the right lower quadrant as well as additional reactive nodes in the central mesentery. Reproductive: Uterus is surgically absent. No concerning adnexal lesions. Other: Small volume of free fluid seen in the deep pelvis. Hazy mesenteric stranding in the right lower quadrant associated with the deviated small bowel loops. Additional trace fluid in the left upper quadrant, upper retroperitoneum and adjacent the herniated proximal stomach. No free air. No other bowel containing hernia. Musculoskeletal: Severe levocurvature of the thoracolumbar junction and dextrocurvature of the lower lumbar levels. Transitional lumbosacral vertebrae. Configuration is stable from priors. Multilevel discogenic and facet degenerative change with additional degenerative features in the hips and pelvis. No acute osseous abnormality or suspicious osseous lesion. IMPRESSION: 1. Appearance concerning for a developing small bowel obstruction secondary to what is likely an internal herniation of the distal ileum. Extensive hazy mesenteric stranding around the deviated loops of small bowel present in the right lower quadrant, as well as thickening of several of the obstructed, fluid-filled loops. Could reflect developing vascular or lymphatic compromise. 2. Large hiatal hernia. Small amount of nonspecific mucosal hyperemia as well as some free fluid  adjacent the herniated gastric body. No extraluminal gas to suggest a frank perforation. Fluid may be reactive or redistributed from the more distal small bowel process. Considered decompression and possible direct visualization as clinically available. 3. Diverticulosis without evidence of acute diverticulitis. 4. Cardiomegaly and pericardial effusion. 5.  Aortic Atherosclerosis (ICD10-I70.0). 6. Severe scoliosis and associated chest wall deformity. 7. Several scattered calcified noncalcified pulmonary  nodules without significant interval change. Electronically Signed: By: Lovena Le M.D. On: 12/10/2020 04:34    Procedures .Critical Care  Date/Time: 12/10/2020 5:26 AM Performed by: Merryl Hacker, MD Authorized by: Merryl Hacker, MD   Critical care provider statement:    Critical care time (minutes):  60   Critical care was necessary to treat or prevent imminent or life-threatening deterioration of the following conditions: acute surgical emergency.   Critical care was time spent personally by me on the following activities:  Discussions with consultants, evaluation of patient's response to treatment, examination of patient, ordering and performing treatments and interventions, ordering and review of laboratory studies, ordering and review of radiographic studies, pulse oximetry, re-evaluation of patient's condition, obtaining history from patient or surrogate, review of old charts and development of treatment plan with patient or surrogate   Medications Ordered in ED Medications  ondansetron (ZOFRAN) injection 4 mg (4 mg Intravenous Given 12/10/20 0318)  fentaNYL (SUBLIMAZE) injection 50 mcg (50 mcg Intravenous Given 12/10/20 0330)  iohexol (OMNIPAQUE) 300 MG/ML solution 75 mL (75 mLs Intravenous Contrast Given 12/10/20 0357)  fentaNYL (SUBLIMAZE) injection 100 mcg (100 mcg Intravenous Given 12/10/20 0431)    ED Course  I have reviewed the triage vital signs and the nursing  notes.  Pertinent labs & imaging results that were available during my care of the patient were reviewed by me and considered in my medical decision making (see chart for details).  Clinical Course as of 12/10/20 0527  Tue Dec 10, 2020  0455 Spoke with general surgery, Dr. Zenia Resides.  She will review CT.  Given concern for possible vascular and lymphatic compromise, request ED to ED transfer. [CH]  [phone] Spoke with Dr. Christy Gentles and Dr. Dayna Barker at [facility].  They have accepted patient for transfer.  Per Biomedical engineer, transport not available until 7 AM.  I have requested patient be prioritized through [facility]. [CH]    Clinical Course User Index [CH] Jsoeph Podesta, Barbette Hair, MD   MDM Rules/Calculators/A&P                          Patient presents with abdominal pain.  She is overall nontoxic-appearing.  She is afebrile.  Initially she is hypertensive.  She looks very uncomfortable and is vomiting.  She has some diffuse abdominal tenderness without signs of peritonitis or localization.  Diminished bowel sounds on abdominal exam.  Considerations include but not limited to, gastroenteritis, SBO, appendicitis, pancreatitis, cholecystitis.  Patient was given pain and nausea medication.  She required 3 doses of pain medication  for persistent symptoms.  She was given fluids.  Labs obtained.  She has a leukocytosis to 17.  Otherwise no significant metabolic derangements.  EKG without acute ischemia or arrhythmia.  CT abdomen and pelvis was obtained.  See above.  She has evidence of likely an SBO with potential internal herniation of the distal ileum.  Question vascular compromise.  This was discussed with general surgery and decision was made to transfer patient ED to ED for general surgery evaluation.  NG tube was placed.  On recheck, patient remains comfortable and vital signs are stable. Final Clinical Impression(s) / ED Diagnoses Final diagnoses:  SBO (small bowel obstruction) ([place])  Generalized abdominal  pain    Rx / DC Orders ED Discharge Orders     None        Analicia Skibinski, Barbette Hair, MD 12/10/20 [phone]

## 2020-12-10 NOTE — Anesthesia Preprocedure Evaluation (Addendum)
Anesthesia Evaluation  Patient identified by MRN, date of birth, ID band Patient awake    Reviewed: Allergy & Precautions, NPO status , Patient's Chart, lab work & pertinent test results  History of Anesthesia Complications Negative for: history of anesthetic complications  Airway Mallampati: II  TM Distance: >3 FB Neck ROM: Full    Dental no notable dental hx. (+) Dental Advisory Given   Pulmonary neg pulmonary ROS,    Pulmonary exam normal        Cardiovascular hypertension, Pt. on home beta blockers and Pt. on medications Normal cardiovascular exam+ pacemaker      Neuro/Psych PSYCHIATRIC DISORDERS Anxiety negative neurological ROS     GI/Hepatic Neg liver ROS, hiatal hernia, GERD  ,SBO   Endo/Other  Hypothyroidism   Renal/GU negative Renal ROS     Musculoskeletal negative musculoskeletal ROS (+)   Abdominal   Peds  Hematology negative hematology ROS (+) Lymphoma: remission   Anesthesia Other Findings   Reproductive/Obstetrics                           Anesthesia Physical Anesthesia Plan  ASA: 3  Anesthesia Plan: General   Post-op Pain Management:    Induction: Intravenous, Rapid sequence and Cricoid pressure planned  PONV Risk Score and Plan: 0 and 3 and Ondansetron  Airway Management Planned: Oral ETT  Additional Equipment:   Intra-op Plan:   Post-operative Plan: Extubation in OR  Informed Consent: I have reviewed the patients History and Physical, chart, labs and discussed the procedure including the risks, benefits and alternatives for the proposed anesthesia with the patient or authorized representative who has indicated his/her understanding and acceptance.       Plan Discussed with: Anesthesiologist and CRNA  Anesthesia Plan Comments:        Anesthesia Quick Evaluation

## 2020-12-10 NOTE — ED Notes (Signed)
NG tube attempted by Stanton Kidney, RN and Claiborne Billings, RN. Three attempts were unsuccessful.

## 2020-12-11 ENCOUNTER — Inpatient Hospital Stay (HOSPITAL_COMMUNITY): Payer: Medicare Other

## 2020-12-11 LAB — CBC
HCT: 38.7 % (ref 36.0–46.0)
Hemoglobin: 12.4 g/dL (ref 12.0–15.0)
MCH: 31.9 pg (ref 26.0–34.0)
MCHC: 32 g/dL (ref 30.0–36.0)
MCV: 99.5 fL (ref 80.0–100.0)
Platelets: 170 10*3/uL (ref 150–400)
RBC: 3.89 MIL/uL (ref 3.87–5.11)
RDW: 13.7 % (ref 11.5–15.5)
WBC: 12.6 10*3/uL — ABNORMAL HIGH (ref 4.0–10.5)
nRBC: 0 % (ref 0.0–0.2)

## 2020-12-11 LAB — BASIC METABOLIC PANEL
Anion gap: 7 (ref 5–15)
BUN: 15 mg/dL (ref 8–23)
CO2: 25 mmol/L (ref 22–32)
Calcium: 8.4 mg/dL — ABNORMAL LOW (ref 8.9–10.3)
Chloride: 105 mmol/L (ref 98–111)
Creatinine, Ser: 0.63 mg/dL (ref 0.44–1.00)
GFR, Estimated: >60 mL/min (ref >60)
Glucose, Bld: 101 mg/dL — ABNORMAL HIGH (ref 70–99)
Potassium: 3.9 mmol/L (ref 3.5–5.1)
Sodium: 137 mmol/L (ref 135–145)

## 2020-12-11 MED ORDER — ACETAMINOPHEN 10 MG/ML IV SOLN
1000.0000 mg | Freq: Four times a day (QID) | INTRAVENOUS | Status: AC
  Administered 2020-12-11 – 2020-12-12 (×3): 1000 mg via INTRAVENOUS
  Filled 2020-12-11 (×4): qty 100

## 2020-12-11 MED ORDER — PHENOL 1.4 % MT LIQD
1.0000 | OROMUCOSAL | Status: DC | PRN
  Administered 2020-12-11: 1 via OROMUCOSAL
  Filled 2020-12-11: qty 177

## 2020-12-11 MED ORDER — DIATRIZOATE MEGLUMINE & SODIUM 66-10 % PO SOLN
90.0000 mL | Freq: Once | ORAL | Status: AC
  Administered 2020-12-11: 90 mL via NASOGASTRIC
  Filled 2020-12-11: qty 90

## 2020-12-11 MED ORDER — SODIUM CHLORIDE 0.9 % IV SOLN
12.5000 mg | Freq: Four times a day (QID) | INTRAVENOUS | Status: DC | PRN
  Administered 2020-12-11 – 2020-12-14 (×6): 12.5 mg via INTRAVENOUS
  Filled 2020-12-11 (×7): qty 0.5

## 2020-12-11 MED ORDER — POTASSIUM CHLORIDE CRYS ER 10 MEQ PO TBCR: 10.0000 meq | EXTENDED_RELEASE_TABLET | Freq: Every day | ORAL | Status: DC

## 2020-12-11 MED ORDER — ENOXAPARIN SODIUM 40 MG/0.4ML IJ SOSY
40.0000 mg | PREFILLED_SYRINGE | INTRAMUSCULAR | Status: DC
  Administered 2020-12-11 – 2020-12-18 (×8): 40 mg via SUBCUTANEOUS
  Filled 2020-12-11 (×8): qty 0.4

## 2020-12-11 MED ORDER — HOME MED STORE IN PYXIS: 1.0000 | Status: DC

## 2020-12-11 MED ORDER — IMMUNE GLOBULIN (HUMAN) 10 GM/50ML ~~LOC~~ SOLN
10.0000 g | SUBCUTANEOUS | Status: DC
  Administered 2020-12-11 – 2020-12-18 (×2): 10 g via SUBCUTANEOUS
  Filled 2020-12-11: qty 1

## 2020-12-11 NOTE — Progress Notes (Signed)
The patient is 1 day status post successful placement of an NG tube with endoscopic assistance, encountering difficulty because of a large hiatal hernia and a lot of retained food in the stomach.  The patient states she feels about 50% better.  On exam, she is lying in bed, perhaps minimally uncomfortable from the NG tube, but certainly not in distress.  The lower abdomen is somewhat distended, but without overt tenderness.  Radiographic evaluation today shows that the tip of the NG tube is in the distal stomach or possibly the duodenum.  I discussed today's KUB with the radiologist.  There is a nonobstructive bowel gas pattern.  It is difficult to compared to yesterday's CT scan in terms of improvement in the possible obstruction, since yesterday's fluid-filled loops seen on CT scan are difficult to assess on today's KUB.  500 mL's of output over the past 24 hours are recorded.  The suction jar is full of bilious and brown, semiopaque fluid.  Impression: Clinical and possibly radiographic improvement, on NG suction for approximately 20 hours.  Plan: We will sign off.  If we can be of further assistance in this patient's care, please feel free to call us back.  Cleotis Nipper, M.D. Pager [phone] If no answer or after 5 PM call [phone]

## 2020-12-11 NOTE — Progress Notes (Signed)
RN called X-ray department for X-ray scheduled at 1945PM. Spoke to [place].

## 2020-12-11 NOTE — Hospital Course (Signed)
Per H&P by Dr. Lorin Mercy: "[name] is a 86 y.o. female with medical history significant of afib;  HTN; hypothyroidism; pacemaker placement; PAD; CVID; and NHL in remission presenting with abdominal pain.  She had a normal day yesterday and ate a small but regular dinner last night.  She had crampy abdominal pain starting about 2030 and had a normal BM about 2100.  The pain continued to escalate and she developed n/v.  Despite multiple (now 6) attempts, NG tube placement has been unsuccessful.  She is feeling mildly better, though, and has not vomited since she was at [facility] (transferred ER:ER).  She previously had a vaginal partial hysterectomy but has not otherwise had abdominal surgery.   ED Course: Sent [facility] to [facility].  SBO with concern for vascular lymphatic compromise, surgery will see but requests [facility] admission."  NG tube placement proved very challenging, with 6 unsuccessful attempts.  Ultimately, GI was able to place NG tube via endoscopy.  Patient's large hiatal hernia felt to be the complicating factor.

## 2020-12-11 NOTE — Progress Notes (Addendum)
Gastrografin administered at this time. Rn notified X-Ray department of status, spoke to [place] for pt's abd xray schedule. Patient up in chair. Denied any distress.  NG tube clamped at this time. Will continue to monitor.

## 2020-12-11 NOTE — Progress Notes (Signed)
Patient c/o nausea and d/t this issue, she is gagging, per pt and dgt at bedside, zofran doesn't help and request for other alternative. MD paged.

## 2020-12-11 NOTE — Progress Notes (Addendum)
Per pt and family, pt feels much better, no nausea, pt did not receive phenergan, c/o 8/10 abd cramp intermittently.   As RN emptied dirty linens, a white lap dog noted rooming in the bathroom. RN explained to pt/family that no animals is allow at the hospital at this time (current dog is not a service dog). Pt's daughter verbalized that its too hot outside and her sister is coming to pick the dog up. RN notified house supervisor.

## 2020-12-11 NOTE — Progress Notes (Signed)
Patient/ family refused CT soft Tissue Neck.On call MD made aware. Stated, will need clarification in the morning if patient still needs it.

## 2020-12-11 NOTE — Progress Notes (Signed)
NG tube unclamped. Pt back in bed.

## 2020-12-11 NOTE — Progress Notes (Addendum)
Progress Note  1 Day Post-Op  Subjective: CC: significant throat pain after multiple NG placement attempts yesterday. Abdomen still feels bloated and sore and she is having ongoing nausea and emesis x1 this morning but overall symptoms have improved from yesterday. She did not undergo protocol yesterday or have CT neck completed. Daughter is bedside and asking about her immunoglobulin treatment which is due.  NG volume: 500 mL   Objective: Vital signs in last 24 hours: Temp:  [97.5 F (36.4 C)-99.1 F (37.3 C)] 98.7 F (37.1 C) (06/15 0423) Pulse Rate:  [56-69] 60 (06/15 0423) Resp:  [16-20] 17 (06/15 0423) BP: (82-164)/(56-105) 149/75 (06/15 0423) SpO2:  [82 %-100 %] 98 % (06/15 0423)    Intake/Output from previous day: 06/14 0701 - 06/15 0700 In: 1047.5 [I.V.:1047.5] Out: 500 [Emesis/NG output:500] Intake/Output this shift: No intake/output data recorded.  PE: General: pleasant, WD, female who is laying in bed in NAD HEENT: head is normocephalic, atraumatic.  Sclera are noninjected. Mouth is pink and moist Heart: regular, rate, and rhythm. Palpable radial pulses bilaterally Lungs: CTAB, no wheezes, rhonchi, or rales noted.  Respiratory effort nonlabored on supplemental O2 via [place] Abd: soft, +BS, mild TTP diffusely. Mild distension. NG cannister with dark green clear liquid MS: all 4 extremities are symmetrical with no cyanosis, clubbing, or edema. Skin: warm and dry with no masses, lesions, or rashes Psych: A&Ox3 with an appropriate affect.    Lab Results:  Recent Labs    12/10/20 0304 12/11/20 0631  WBC 16.3* 12.6*  HGB 14.7 12.4  HCT 42.6 38.7  PLT 248 170   BMET Recent Labs    12/10/20 0304 12/11/20 0631  NA 141 137  K 3.6 3.9  CL 104 105  CO2 23 25  GLUCOSE 145* 101*  BUN 14 15  CREATININE 0.73 0.63  CALCIUM 10.3 8.4*   PT/INR No results for input(s): LABPROT, INR in the last 72 hours. CMP     Component Value Date/Time   NA 137 12/11/2020  0631   NA 143 11/26/2017 1159   NA 143 06/16/2017 0921   K 3.9 12/11/2020 0631   K 3.8 06/16/2017 0921   CL 105 12/11/2020 0631   CL 109 (H) 12/14/2012 1009   CO2 25 12/11/2020 0631   CO2 28 06/16/2017 0921   GLUCOSE 101 (H) 12/11/2020 0631   GLUCOSE 69 (L) 06/16/2017 0921   GLUCOSE 64 (L) 12/14/2012 1009   BUN 15 12/11/2020 0631   BUN 18 11/26/2017 1159   BUN 11.7 06/16/2017 0921   CREATININE 0.63 12/11/2020 0631   CREATININE 0.80 06/14/2020 1335   CREATININE 0.8 06/16/2017 0921   CALCIUM 8.4 (L) 12/11/2020 0631   CALCIUM 9.7 06/16/2017 0921   PROT 7.9 12/10/2020 0304   PROT 7.5 06/16/2017 0921   ALBUMIN 4.9 12/10/2020 0304   ALBUMIN 3.9 06/16/2017 0921   AST 24 12/10/2020 0304   AST 28 06/14/2020 1335   AST 23 06/16/2017 0921   ALT 16 12/10/2020 0304   ALT 19 06/14/2020 1335   ALT 18 06/16/2017 0921   ALKPHOS 64 12/10/2020 0304   ALKPHOS 63 06/16/2017 0921   BILITOT 0.6 12/10/2020 0304   BILITOT 0.6 06/14/2020 1335   BILITOT 0.52 06/16/2017 0921   GFRNONAA >60 12/11/2020 0631   GFRNONAA >60 06/14/2020 1335   GFRNONAA >60 10/16/2010 1013   GFRAA >60 06/16/2019 1030   GFRAA >60 10/16/2010 1013   Lipase     Component Value Date/Time   LIPASE  19 12/10/2020 0304       Studies/Results: CT ABDOMEN PELVIS W CONTRAST  Addendum Date: 12/10/2020   ADDENDUM REPORT: 12/10/2020 04:50 ADDENDUM: These results were called by telephone at the time of physician contact on 12/10/2020 at 4:50 am to provider COURTNEY HORTON , who verbally acknowledged these results. Electronically Signed   By: Lovena Le M.D.   On: 12/10/2020 04:50   Result Date: 12/10/2020 CLINICAL DATA:  Abdominal pain and cramping with emesis since last night EXAM: CT ABDOMEN AND PELVIS WITH CONTRAST TECHNIQUE: Multidetector CT imaging of the abdomen and pelvis was performed using the standard protocol following bolus administration of intravenous contrast. CONTRAST:  31mL OMNIPAQUE IOHEXOL 300 MG/ML  SOLN  COMPARISON:  CT 06/16/2019 FINDINGS: Lower chest: Small pericardial effusion, diminished from prior exam. Pacer leads terminate at the cardiac apex and right atrium. Cardiac size is borderline enlarged though similar to comparison prior. Scarring and atelectatic changes in the left lung base adjacent a large hiatal hernia further detailed below. Some additional atelectasis noted in the posterior right lower lobe. Few scattered calcified noncalcified lower lobe micro nodules are not significantly changed from prior. Hepatobiliary: Few stable subcentimeter hypoattenuating foci throughout the liver, too small to fully characterize on CT imaging but statistically likely benign. A slightly larger 11 mm hypodense focus towards the hepatic dome, probable cyst. Gallbladder is unremarkable. No significant biliary ductal dilatation accounting for typical senescent changes. No visible intraductal gallstone. Pancreas: No pancreatic ductal dilatation or surrounding inflammatory changes. Spleen: Normal in size. No concerning splenic lesions. Adrenals/Urinary Tract: No concerning adrenal nodules or masses. Kidneys demonstrate symmetric enhancementand excretion. Scattered subcentimeter hypoattenuating foci throughout both kidneys too small to fully characterize on CT imaging but statistically likely benign. No suspicious renal lesion, urolithiasis or hydronephrosis. Urinary bladder is unremarkable for the degree of distention. Stomach/Bowel: Large hiatal hernia with what appears to be some organoaxial rotation similar to prior exam albeit with some increased gastric distention by fluid air and ingested material. Small amount of low-attenuation free fluid is seen adjacent the herniated stomach (2/12). No extraluminal gas, organized collection or abscess is seen. Some mild mucosal hyperemia within the herniated gastric segments and gastric antrum are nonspecific. Duodenum with a normal sweep across the midline abdomen. Distal small  bowel appears diffusely fluid-filled with several segments demonstrating some conspicuous mural thickening in the right lower quadrant (2/37). There are few clustered transition points noted in the right lower quadrant with displaced small bowel and associated hazy mesenteric changes (2/51) concerning for an obstructive, internal hernia involving the distal ileum with decompression to the level of the ileocecal valve in the right lower quadrant. Normal appendix. Moderate colonic stool burden. Extensive pancolonic diverticulosis most pronounced in the more distal segments. No significant colonic thickening accounting for underdistention. Vascular/Lymphatic: Atherosclerotic calcifications within the abdominal aorta and branch vessels. No aneurysm or ectasia. Extensive aortic tortuosity similar to prior. Some congested lymph nodes noted within the right lower quadrant as well as additional reactive nodes in the central mesentery. Reproductive: Uterus is surgically absent. No concerning adnexal lesions. Other: Small volume of free fluid seen in the deep pelvis. Hazy mesenteric stranding in the right lower quadrant associated with the deviated small bowel loops. Additional trace fluid in the left upper quadrant, upper retroperitoneum and adjacent the herniated proximal stomach. No free air. No other bowel containing hernia. Musculoskeletal: Severe levocurvature of the thoracolumbar junction and dextrocurvature of the lower lumbar levels. Transitional lumbosacral vertebrae. Configuration is stable from priors. Multilevel discogenic and  facet degenerative change with additional degenerative features in the hips and pelvis. No acute osseous abnormality or suspicious osseous lesion. IMPRESSION: 1. Appearance concerning for a developing small bowel obstruction secondary to what is likely an internal herniation of the distal ileum. Extensive hazy mesenteric stranding around the deviated loops of small bowel present in the  right lower quadrant, as well as thickening of several of the obstructed, fluid-filled loops. Could reflect developing vascular or lymphatic compromise. 2. Large hiatal hernia. Small amount of nonspecific mucosal hyperemia as well as some free fluid adjacent the herniated gastric body. No extraluminal gas to suggest a frank perforation. Fluid may be reactive or redistributed from the more distal small bowel process. Considered decompression and possible direct visualization as clinically available. 3. Diverticulosis without evidence of acute diverticulitis. 4. Cardiomegaly and pericardial effusion. 5.  Aortic Atherosclerosis (ICD10-I70.0). 6. Severe scoliosis and associated chest wall deformity. 7. Several scattered calcified noncalcified pulmonary nodules without significant interval change. Electronically Signed: By: Lovena Le M.D. On: 12/10/2020 04:34   DG Addison Bailey G Tube Plc W/Fl W/Rad  Result Date: 12/10/2020 CLINICAL DATA:  nasogastric tube placement EXAM: NASO G TUBE PLACEMENT WITH FL AND WITH RAD CONTRAST:  Light barium FLUOROSCOPY TIME:  Fluoroscopy Time:  3 minutes 12 seconds Radiation Exposure Index (if provided by the fluoroscopic device): 6.2 mGy Number of Acquired Spot Images: 0 COMPARISON:  Chest CT 06/14/2020 FINDINGS: Nasogastric tube placement was attempted utilizing fluoroscopic guidance. A 12 French nasogastric tube was inserted into left nostril and passed to level of C7/T1, where there was abrupt resistance and kinking of the nasogastric tube. A wire was placed through the nasogastric tube, and additional attempts were made to pass the tube further into the esophagus, without success. A small amount of light barium was injected through the tube which passed into the lower esophagus. A 12 French nasogastric tube was then removed and an attempt was made to pass a 10 French nasogastric tube. Again, the nasogastric tube met abrupt resistance at the level of C7/T1, with kinking of the tube.  IMPRESSION: Unsuccessful nasogastric tube placement under fluoroscopic guidance despite multiple attempts and technique. Abrupt and persistent resistance was met at the level of C7/T1 on every attempt. Consider CT of the neck/chest to rule out an obstructing lesion. These results were called by telephone at the time of interpretation on 12/10/2020 at 3:20 pm to provider [facility] , who verbally acknowledged these results. Electronically Signed   By: Maurine Simmering   On: 12/10/2020 15:22    Anti-infectives: Anti-infectives (From admission, onward)    None        Assessment/Plan  SBO - afebrile, VSS,  - WBC 12.6 (16.3) - PMH partial hysterectomy 1980's - CT A/P W/ Contrast 6/14 significant for SBO with possible transition point in the distal ileum, large hiatal hernia.  - NG tube placement failed with flouro and placed by GI with endoscopy - SBO protocol start today - not tolerating PO meds at this time - IV tylenol for improved pain control - ambulate, incentive spirometer - no urgent/emergent indication for surgery at this time.  Daughter is asking about CT. Will discuss need for this with primary  FEN: NPO, NG LIWS ID: none VTE: SCDs, okay for chemical prophylaxis from surgery standpoint   Per primary team: Hiatal Hernia HTN Hypothyroidism A.fib Permanent pacemaker placement 2013 PAD NHL in remission    LOS: 1 day    Winferd Humphrey, [facility] 12/11/2020, 7:52 AM Please  see Amion for pager number during day hours 7:00am-4:30pm

## 2020-12-11 NOTE — Progress Notes (Signed)
PROGRESS NOTE    PASQUALINA COLASURDO   TKZ:[id number]  DOB: Jan 19, 1935  PCP: Janith Lima, MD    DOA: 12/10/2020 LOS: 1   Assessment & Plan   Principal Problem:   SBO (small bowel obstruction) ([place]) Active Problems:   Hypothyroidism   Essential hypertension   Pacemaker   Grade 3 follicular lymphoma of lymph nodes of neck (HCC)   PAD (peripheral artery disease) (HCC)   SBO - in setting of distant partial hysterectomy (1980's).  CT abdomen/pelvis showed SBO with possible transition point in the distal ileum.   --SBO protoloc --NG tube to low-intermittent wall suction --IV med substitutes, not tolerating PO meds --IV antiemetics - Zofran, Phenergan 2nd line --Pain control PRN --Ambulate --NPO --IV fluids  Large hiatal hernia - likely caused difficulty with NG placement. Monitor.  CT neck cancelled.  CVID - daughter brought patient's Hizentra which she gets 10 g weekly.  Hx of recurrent sinopulmonary infections, occasional GI symptoms which are quite different than current presentation.  Non-hodgin's Lymphoma / Follicular - follow with Dr. Julien Nordmann, last visit Dec 2021.  No acute issues.  Monitor.    PAD - Follows with Dr. Stanford Breed, last seen in Dec 2021, was to start ASA and statin.  These not on her med history(?) --Outpatient follow up  Pacemaker in place -on admission, patient inquired about interrogation of device.  Admitting physician requested cardiology input but we have low suspicion for any cardiac issues leading to the current clinical presentation.  Monitor.  A. Fib -rate controlled, as needed IV Lopressor for now while n.p.o.  Appears not on anticoagulation.  History of hypertension -continue regimen appears lisinopril and Lopressor, if tolerating oral meds.  As needed IV Lopressor..  Hypothyroidism -resume Synthroid at current dose when resuming p.o.    Patient BMI: Body mass index is 24.8 kg/m.    DVT prophylaxis: enoxaparin (LOVENOX) injection 40 mg  Start: 12/11/20 2000 SCDs Start: 12/10/20 1059   Diet:  Diet Orders (From admission, onward)     Start     Ordered   12/11/20 0836  Diet NPO time specified Except for: Ice Chips, Sips with Meds  Diet effective now       Question Answer Comment  Except for Ice Chips   Except for Sips with Meds      12/11/20 0835              Code Status: Full Code   Brief Narrative / Hospital Course to Date:   Per H&P by Dr. Lorin Mercy: "TISHINA LOWN is a 86 y.o. female with medical history significant of afib;  HTN; hypothyroidism; pacemaker placement; PAD; CVID; and NHL in remission presenting with abdominal pain.  She had a normal day yesterday and ate a small but regular dinner last night.  She had crampy abdominal pain starting about 2030 and had a normal BM about 2100.  The pain continued to escalate and she developed n/v.  Despite multiple (now 6) attempts, NG tube placement has been unsuccessful.  She is feeling mildly better, though, and has not vomited since she was at [facility] (transferred ER:ER).  She previously had a vaginal partial hysterectomy but has not otherwise had abdominal surgery.   ED Course: Sent [facility] to [facility].  SBO with concern for vascular lymphatic compromise, surgery will see but requests [facility] admission."  NG tube placement proved very challenging, with 6 unsuccessful attempts.  Ultimately, GI was able to place NG tube via endoscopy.  Patient's large hiatal  hernia felt to be the complicating factor.      Subjective 12/11/20    pPatient seen with daughter at bedside on rounds this morning.  She mentions having sore throat due to several attempts at NG tube placement.  Continues to have abdominal pain and nausea.  States she vomited once earlier this morning.  Discussed with daughter patient is due for her Hizentra infusion today for C VID.   Disposition Plan & Communication   Status is: Inpatient  Remains inpatient appropriate because:IV treatments appropriate due to  intensity of illness or inability to take PO  Dispo: The patient is from: Home              Anticipated d/c is to: Home              Patient currently is not medically stable to d/c.   Difficult to place patient No  Family Communication: Daughter at bedside on rounds   Consults, Procedures, Significant Events   Consultants:  General surgery Gastroenterology  Procedures:  NG tube placement under endoscopic guidance by GI  Antimicrobials:  Anti-infectives (From admission, onward)    None         Micro    Objective   Vitals:   12/10/20 2002 12/11/20 0002 12/11/20 0423 12/11/20 1210  BP: (!) 164/73 123/62 (!) 149/75 127/68  Pulse: 61 64 60 (!) 57  Resp: _0 Temp: 97.7 F (36.5 C) 99.1 F (37.3 C) 98.7 F (37.1 C) 98.4 F (36.9 C)  TempSrc: Oral Oral Oral Oral  SpO2: 98% 98% 98% 94%  Weight:      Height:        Intake/Output Summary (Last 24 hours) at 12/11/2020 1716 Last data filed at 12/11/2020 1400 Gross per 24 hour  Intake 2029.85 ml  Output 650 ml  Net 1379.85 ml   Filed Weights   12/10/20 0259  Weight: 63.5 kg    Physical Exam:  General exam: awake, alert, no acute distress HEENT: atraumatic, clear conjunctiva, anicteric sclera, NG tube in place to wall suction, moist mucus membranesRespiratory system: CTAB with diminished bases due to shallow inspiration due to poor inspiratory effort, no wheezes, rales or rhonchi, normal respiratory effort. Cardiovascular system: normal S1/S2, RRR, nno pedal edema.   Gastrointestinal system: soft, mild tenderness on palpation without rebound or guarding, ND, no HSM felt, +bowel sounds. Central nervous system: A&O x3. no gross focal neurologic deficits, normal speech Extremities: moves all, no edema, normal tone Skin: dry, intact, normal temperature, normal color, No rashes, lesions or ulcers Psychiatry: normal mood, congruent affect, judgement and insight appear normal  Labs   Data Reviewed: I have  personally reviewed following labs and imaging studies  CBC: Recent Labs  Lab 12/10/20 0304 12/11/20 0631  WBC 16.3* 12.6*  NEUTROABS 13.6*  --   HGB 14.7 12.4  HCT 42.6 38.7  MCV 92.6 99.5  PLT 248 350   Basic Metabolic Panel: Recent Labs  Lab 12/10/20 0304 12/11/20 0631  NA 141 137  K 3.6 3.9  CL 104 105  CO2 23 25  GLUCOSE 145* 101*  BUN 14 15  CREATININE 0.73 0.63  CALCIUM 10.3 8.4*   GFR: Estimated Creatinine Clearance: 46.9 mL/min (by C-G formula based on SCr of 0.63 mg/dL). Liver Function Tests: Recent Labs  Lab 12/10/20 0304  AST 24  ALT 16  ALKPHOS 64  BILITOT 0.6  PROT 7.9  ALBUMIN 4.9   Recent Labs  Lab 12/10/20 0304  LIPASE 19   No results for input(s): AMMONIA in the last 168 hours. Coagulation Profile: No results for input(s): INR, PROTIME in the last 168 hours. Cardiac Enzymes: No results for input(s): CKTOTAL, CKMB, CKMBINDEX, TROPONINI in the last 168 hours. BNP (last 3 results) No results for input(s): PROBNP in the last 8760 hours. HbA1C: No results for input(s): HGBA1C in the last 72 hours. CBG: No results for input(s): GLUCAP in the last 168 hours. Lipid Profile: No results for input(s): CHOL, HDL, LDLCALC, TRIG, CHOLHDL, LDLDIRECT in the last 72 hours. Thyroid Function Tests: No results for input(s): TSH, T4TOTAL, FREET4, T3FREE, THYROIDAB in the last 72 hours. Anemia Panel: No results for input(s): VITAMINB12, FOLATE, FERRITIN, TIBC, IRON, RETICCTPCT in the last 72 hours. Sepsis Labs: Recent Labs  Lab 12/10/20 0604  LATICACIDVEN 0.9    Recent Results (from the past 240 hour(s))  Resp Panel by RT-PCR (Flu A&B, Covid) Nasopharyngeal Swab     Status: None   Collection Time: 12/10/20  6:04 AM   Specimen: Nasopharyngeal Swab; Nasopharyngeal(NP) swabs in vial transport medium  Result Value Ref Range Status   SARS Coronavirus 2 by RT PCR NEGATIVE NEGATIVE Final    Comment: (NOTE) SARS-CoV-2 target nucleic acids are NOT  DETECTED.  The SARS-CoV-2 RNA is generally detectable in upper respiratory specimens during the acute phase of infection. The lowest concentration of SARS-CoV-2 viral copies this assay can detect is 138 copies/mL. A negative result does not preclude SARS-Cov-2 infection and should not be used as the sole basis for treatment or other patient management decisions. A negative result may occur with  improper specimen collection/handling, submission of specimen other than nasopharyngeal swab, presence of viral mutation(s) within the areas targeted by this assay, and inadequate number of viral copies(<138 copies/mL). A negative result must be combined with clinical observations, patient history, and epidemiological information. The expected result is Negative.  Fact Sheet for Patients:  EntrepreneurPulse.com.au  Fact Sheet for Healthcare Providers:  IncredibleEmployment.be  This test is no t yet approved or cleared by the [place] FDA and  has been authorized for detection and/or diagnosis of SARS-CoV-2 by FDA under an Emergency Use Authorization (EUA). This EUA will remain  in effect (meaning this test can be used) for the duration of the COVID-19 declaration under Section 564(b)(1) of the Act, 21 U.S.C.section 360bbb-3(b)(1), unless the authorization is terminated  or revoked sooner.       Influenza A by PCR NEGATIVE NEGATIVE Final   Influenza B by PCR NEGATIVE NEGATIVE Final    Comment: (NOTE) The Xpert Xpress SARS-CoV-2/FLU/RSV plus assay is intended as an aid in the diagnosis of influenza from Nasopharyngeal swab specimens and should not be used as a sole basis for treatment. Nasal washings and aspirates are unacceptable for Xpert Xpress SARS-CoV-2/FLU/RSV testing.  Fact Sheet for Patients: EntrepreneurPulse.com.au  Fact Sheet for Healthcare Providers: IncredibleEmployment.be  This test is not yet  approved or cleared by the [place] FDA and has been authorized for detection and/or diagnosis of SARS-CoV-2 by FDA under an Emergency Use Authorization (EUA). This EUA will remain in effect (meaning this test can be used) for the duration of the COVID-19 declaration under Section 564(b)(1) of the Act, 21 U.S.C. section 360bbb-3(b)(1), unless the authorization is terminated or revoked.  Performed at [facility], [address] [place], [place] [address]       Imaging Studies   CT ABDOMEN PELVIS W CONTRAST  Addendum Date: 12/10/2020   ADDENDUM REPORT: 12/10/2020 04:50  ADDENDUM: These results were called by telephone at the time of physician contact on 12/10/2020 at 4:50 am to provider [facility] , who verbally acknowledged these results. Electronically Signed   By: Lovena Le M.D.   On: 12/10/2020 04:50   Result Date: 12/10/2020 CLINICAL DATA:  Abdominal pain and cramping with emesis since last night EXAM: CT ABDOMEN AND PELVIS WITH CONTRAST TECHNIQUE: Multidetector CT imaging of the abdomen and pelvis was performed using the standard protocol following bolus administration of intravenous contrast. CONTRAST:  66m OMNIPAQUE IOHEXOL 300 MG/ML  SOLN COMPARISON:  CT 06/16/2019 FINDINGS: Lower chest: Small pericardial effusion, diminished from prior exam. Pacer leads terminate at the cardiac apex and right atrium. Cardiac size is borderline enlarged though similar to comparison prior. Scarring and atelectatic changes in the left lung base adjacent a large hiatal hernia further detailed below. Some additional atelectasis noted in the posterior right lower lobe. Few scattered calcified noncalcified lower lobe micro nodules are not significantly changed from prior. Hepatobiliary: Few stable subcentimeter hypoattenuating foci throughout the liver, too small to fully characterize on CT imaging but statistically likely benign. A slightly larger 11 mm hypodense focus towards  the hepatic dome, probable cyst. Gallbladder is unremarkable. No significant biliary ductal dilatation accounting for typical senescent changes. No visible intraductal gallstone. Pancreas: No pancreatic ductal dilatation or surrounding inflammatory changes. Spleen: Normal in size. No concerning splenic lesions. Adrenals/Urinary Tract: No concerning adrenal nodules or masses. Kidneys demonstrate symmetric enhancementand excretion. Scattered subcentimeter hypoattenuating foci throughout both kidneys too small to fully characterize on CT imaging but statistically likely benign. No suspicious renal lesion, urolithiasis or hydronephrosis. Urinary bladder is unremarkable for the degree of distention. Stomach/Bowel: Large hiatal hernia with what appears to be some organoaxial rotation similar to prior exam albeit with some increased gastric distention by fluid air and ingested material. Small amount of low-attenuation free fluid is seen adjacent the herniated stomach (2/12). No extraluminal gas, organized collection or abscess is seen. Some mild mucosal hyperemia within the herniated gastric segments and gastric antrum are nonspecific. Duodenum with a normal sweep across the midline abdomen. Distal small bowel appears diffusely fluid-filled with several segments demonstrating some conspicuous mural thickening in the right lower quadrant (2/37). There are few clustered transition points noted in the right lower quadrant with displaced small bowel and associated hazy mesenteric changes (2/51) concerning for an obstructive, internal hernia involving the distal ileum with decompression to the level of the ileocecal valve in the right lower quadrant. Normal appendix. Moderate colonic stool burden. Extensive pancolonic diverticulosis most pronounced in the more distal segments. No significant colonic thickening accounting for underdistention. Vascular/Lymphatic: Atherosclerotic calcifications within the abdominal aorta and  branch vessels. No aneurysm or ectasia. Extensive aortic tortuosity similar to prior. Some congested lymph nodes noted within the right lower quadrant as well as additional reactive nodes in the central mesentery. Reproductive: Uterus is surgically absent. No concerning adnexal lesions. Other: Small volume of free fluid seen in the deep pelvis. Hazy mesenteric stranding in the right lower quadrant associated with the deviated small bowel loops. Additional trace fluid in the left upper quadrant, upper retroperitoneum and adjacent the herniated proximal stomach. No free air. No other bowel containing hernia. Musculoskeletal: Severe levocurvature of the thoracolumbar junction and dextrocurvature of the lower lumbar levels. Transitional lumbosacral vertebrae. Configuration is stable from priors. Multilevel discogenic and facet degenerative change with additional degenerative features in the hips and pelvis. No acute osseous abnormality or suspicious osseous lesion. IMPRESSION: 1. Appearance concerning for  a developing small bowel obstruction secondary to what is likely an internal herniation of the distal ileum. Extensive hazy mesenteric stranding around the deviated loops of small bowel present in the right lower quadrant, as well as thickening of several of the obstructed, fluid-filled loops. Could reflect developing vascular or lymphatic compromise. 2. Large hiatal hernia. Small amount of nonspecific mucosal hyperemia as well as some free fluid adjacent the herniated gastric body. No extraluminal gas to suggest a frank perforation. Fluid may be reactive or redistributed from the more distal small bowel process. Considered decompression and possible direct visualization as clinically available. 3. Diverticulosis without evidence of acute diverticulitis. 4. Cardiomegaly and pericardial effusion. 5.  Aortic Atherosclerosis (ICD10-I70.0). 6. Severe scoliosis and associated chest wall deformity. 7. Several scattered  calcified noncalcified pulmonary nodules without significant interval change. Electronically Signed: By: Lovena Le M.D. On: 12/10/2020 04:34   DG Abd Portable 1V  Result Date: 12/11/2020 CLINICAL DATA:  Nasogastric catheter EXAM: PORTABLE ABDOMEN - 1 VIEW COMPARISON:  None. FINDINGS: Gastric catheter is noted in the distal stomach and likely into the second portion of the duodenum. Scattered large and small bowel gas is noted. Degenerative changes of lumbar spine are noted with scoliosis concave to the right at the thoracolumbar junction. IMPRESSION: Gastric catheter appears to extend into the second portion of the duodenum. Electronically Signed   By: Inez Catalina M.D.   On: 12/11/2020 08:11   DG Naso G Tube Plc W/Fl W/Rad  Result Date: 12/10/2020 CLINICAL DATA:  nasogastric tube placement EXAM: NASO G TUBE PLACEMENT WITH FL AND WITH RAD CONTRAST:  Light barium FLUOROSCOPY TIME:  Fluoroscopy Time:  3 minutes 12 seconds Radiation Exposure Index (if provided by the fluoroscopic device): 6.2 mGy Number of Acquired Spot Images: 0 COMPARISON:  Chest CT 06/14/2020 FINDINGS: Nasogastric tube placement was attempted utilizing fluoroscopic guidance. A 12 French nasogastric tube was inserted into left nostril and passed to level of C7/T1, where there was abrupt resistance and kinking of the nasogastric tube. A wire was placed through the nasogastric tube, and additional attempts were made to pass the tube further into the esophagus, without success. A small amount of light barium was injected through the tube which passed into the lower esophagus. A 12 French nasogastric tube was then removed and an attempt was made to pass a 10 French nasogastric tube. Again, the nasogastric tube met abrupt resistance at the level of C7/T1, with kinking of the tube. IMPRESSION: Unsuccessful nasogastric tube placement under fluoroscopic guidance despite multiple attempts and technique. Abrupt and persistent resistance was met at  the level of C7/T1 on every attempt. Consider CT of the neck/chest to rule out an obstructing lesion. These results were called by telephone at the time of interpretation on 12/10/2020 at 3:20 pm to provider [facility] , who verbally acknowledged these results. Electronically Signed   By: Maurine Simmering   On: 12/10/2020 15:22     Medications   Scheduled Meds:  calcium-vitamin D  1 tablet Oral BID   enoxaparin (LOVENOX) injection  40 mg Subcutaneous Q24H   Ferrous Fumarate  1 tablet Oral Daily   gabapentin  300 mg Oral QID   Immune Globulin (Human)  10 g Subcutaneous Q Wed   levothyroxine  100 mcg Oral Q0600   lisinopril  20 mg Oral Daily   metoprolol tartrate  12.5 mg Oral BID   olopatadine  1 drop Both Eyes BID   pantoprazole (PROTONIX) IV  40 mg Intravenous Q24H  potassium chloride  10 mEq Oral Daily   Continuous Infusions:  acetaminophen 1,000 mg (12/11/20 1128)   lactated ringers     lactated ringers 75 mL/hr at 12/11/20 1303   promethazine (PHENERGAN) injection (IM or IVPB)         LOS: 1 day    Time spent: 30 minutes    Ezekiel Slocumb, DO [facility]  12/11/2020, 5:16 PM      If 7PM-7AM, please contact night-coverage. How to contact the [facility] Attending or Consulting provider [place] or covering provider during after hours [place], for this patient?    Check the care team in [facility] and look for a) attending/consulting [facility] provider listed and b) the [facility] team listed Log into [email or web address] and use [place]'s universal password to access. If you do not have the password, please contact the hospital operator. Locate the [facility] provider you are looking for under [facility] and page to a number that you can be directly reached. If you still have difficulty reaching the provider, please page the [facility] (Director on Call) for the Hospitalists listed on amion for assistance.

## 2020-12-12 ENCOUNTER — Inpatient Hospital Stay (HOSPITAL_COMMUNITY): Payer: Medicare Other

## 2020-12-12 ENCOUNTER — Encounter (HOSPITAL_COMMUNITY): Payer: Self-pay | Admitting: Gastroenterology

## 2020-12-12 LAB — MAGNESIUM: Magnesium: 1.9 mg/dL (ref 1.7–2.4)

## 2020-12-12 LAB — CBC
HCT: 39.8 % (ref 36.0–46.0)
Hemoglobin: 13.3 g/dL (ref 12.0–15.0)
MCH: 32 pg (ref 26.0–34.0)
MCHC: 33.4 g/dL (ref 30.0–36.0)
MCV: 95.7 fL (ref 80.0–100.0)
Platelets: 200 10*3/uL (ref 150–400)
RBC: 4.16 MIL/uL (ref 3.87–5.11)
RDW: 13.4 % (ref 11.5–15.5)
WBC: 12.7 10*3/uL — ABNORMAL HIGH (ref 4.0–10.5)
nRBC: 0 % (ref 0.0–0.2)

## 2020-12-12 LAB — BASIC METABOLIC PANEL
Anion gap: 12 (ref 5–15)
BUN: 15 mg/dL (ref 8–23)
CO2: 26 mmol/L (ref 22–32)
Calcium: 8.8 mg/dL — ABNORMAL LOW (ref 8.9–10.3)
Chloride: 99 mmol/L (ref 98–111)
Creatinine, Ser: 0.74 mg/dL (ref 0.44–1.00)
GFR, Estimated: >60 mL/min (ref >60)
Glucose, Bld: 80 mg/dL (ref 70–99)
Potassium: 3.5 mmol/L (ref 3.5–5.1)
Sodium: 137 mmol/L (ref 135–145)

## 2020-12-12 MED ORDER — METHOCARBAMOL 1000 MG/10ML IJ SOLN
500.0000 mg | Freq: Four times a day (QID) | INTRAVENOUS | Status: DC | PRN
  Filled 2020-12-12: qty 5

## 2020-12-12 MED ORDER — ACETAMINOPHEN 10 MG/ML IV SOLN
1000.0000 mg | Freq: Four times a day (QID) | INTRAVENOUS | Status: AC
  Administered 2020-12-12 – 2020-12-13 (×3): 1000 mg via INTRAVENOUS
  Filled 2020-12-12 (×4): qty 100

## 2020-12-12 NOTE — Progress Notes (Signed)
PROGRESS NOTE    [name]   SWH:[id number]  DOB: 22-Jan-1935  PCP: Janith Lima, MD    DOA: 12/10/2020 LOS: 2   Assessment & Plan   Principal Problem:   SBO (small bowel obstruction) ([place]) Active Problems:   Hypothyroidism   Essential hypertension   Pacemaker   Grade 3 follicular lymphoma of lymph nodes of neck (HCC)   PAD (peripheral artery disease) (HCC)   SBO - in setting of distant partial hysterectomy (1980's).  CT abdomen/pelvis showed SBO with possible transition point in the distal ileum.   --SBO protoloc --NG tube to low-intermittent wall suction --IV med substitutes, not tolerating PO meds --IV antiemetics - Zofran, Phenergan 2nd line --Pain control PRN --Ambulate --NPO, ice chips --IV fluids  Large hiatal hernia - likely caused difficulty with NG placement. Monitor.  CT neck cancelled.  CVID - daughter brought patient's Hizentra which she gets 10 g weekly.  Hx of recurrent sinopulmonary infections, occasional GI symptoms which are quite different than current presentation. --Hizentra given 6/15  Non-hodgin's Lymphoma / Follicular - follow with Dr. Julien Nordmann, last visit Dec 2021.  No acute issues.  Monitor.    PAD - Follows with Dr. Stanford Breed, last seen in Dec 2021, was to start ASA and statin.  These not on her med history(?) --Outpatient follow up  Pacemaker in place -on admission, patient inquired about interrogation of device.  Admitting physician requested cardiology input but we have low suspicion for any cardiac issues leading to the current clinical presentation.  Monitor.  A. Fib -rate controlled, as needed IV Lopressor for now while n.p.o.  Appears not on anticoagulation.  History of hypertension -continue regimen appears lisinopril and Lopressor, if tolerating oral meds.  As needed IV Lopressor..  Hypothyroidism -resume Synthroid at current dose when resuming p.o.    Patient BMI: Body mass index is 24.8 kg/m.    DVT prophylaxis:  enoxaparin (LOVENOX) injection 40 mg Start: 12/11/20 2000 SCDs Start: 12/10/20 1059   Diet:  Diet Orders (From admission, onward)     Start     Ordered   12/11/20 0836  Diet NPO time specified Except for: Ice Chips, Sips with Meds  Diet effective now       Question Answer Comment  Except for Ice Chips   Except for Sips with Meds      12/11/20 0835              Code Status: Full Code   Brief Narrative / Hospital Course to Date:   Per H&P by Dr. Lorin Mercy: "[name] is a 86 y.o. female with medical history significant of afib;  HTN; hypothyroidism; pacemaker placement; PAD; CVID; and NHL in remission presenting with abdominal pain.  She had a normal day yesterday and ate a small but regular dinner last night.  She had crampy abdominal pain starting about 2030 and had a normal BM about 2100.  The pain continued to escalate and she developed n/v.  Despite multiple (now 6) attempts, NG tube placement has been unsuccessful.  She is feeling mildly better, though, and has not vomited since she was at [facility] (transferred ER:ER).  She previously had a vaginal partial hysterectomy but has not otherwise had abdominal surgery.   ED Course: Sent [facility] to [facility].  SBO with concern for vascular lymphatic compromise, surgery will see but requests [facility] admission."  NG tube placement proved very challenging, with 6 unsuccessful attempts.  Ultimately, GI was able to place NG tube via  endoscopy.  Patient's large hiatal hernia felt to be the complicating factor.      Subjective 12/12/20    Patient seen with daughter at bedside.  Pt says feeling slightly better.  Still with nausea, single episode vomiting this AM.  States pain starting to come back and asks I request her RN to bring medicine.  Requests ice chips.  No other complaints at this time.    Disposition Plan & Communication   Status is: Inpatient  Remains inpatient appropriate because:IV treatments appropriate due to intensity of illness or  inability to take PO  Dispo: The patient is from: Home              Anticipated d/c is to: Home              Patient currently is not medically stable to d/c.   Difficult to place patient No  Family Communication: Daughter at bedside on rounds 6/15, 6/16   Consults, Procedures, Significant Events   Consultants:  General surgery Gastroenterology  Procedures:  NG tube placement under endoscopic guidance by GI  Antimicrobials:  Anti-infectives (From admission, onward)    None         Micro    Objective   Vitals:   12/11/20 2246 12/12/20 0449 12/12/20 0936 12/12/20 1719  BP: 124/68 (!) 156/88 132/63 (!) 152/78  Pulse: 60 75 66 75  Resp: 18 20 18 17   Temp: 98.6 F (37 C) 98.8 F (37.1 C) 98.7 F (37.1 C) 98.2 F (36.8 C)  TempSrc: Oral Oral Oral Oral  SpO2: 95% 95% 95% 98%  Weight:      Height:        Intake/Output Summary (Last 24 hours) at 12/12/2020 1744 Last data filed at 12/12/2020 1742 Gross per 24 hour  Intake 1877.79 ml  Output 2250 ml  Net -372.21 ml   Filed Weights   12/10/20 0259  Weight: 63.5 kg    Physical Exam:  General exam: awake, alert, no acute distress HEENT: atraumatic, clear conjunctiva, anicteric sclera, NG tube in place to wall suction with brown fluid in canister and tubing,  Respiratory system: CTAB, no wheezes, rales or rhonchi, normal respiratory effort. Cardiovascular system: normal S1/S2, RRR, nno pedal edema.   Gastrointestinal system: soft, mildly tender, non-distended, +bowel sounds except for mid-left lower abdomen Central nervous system: A&O x3. Grossly non-focal exam, normal speech Psychiatry: normal mood, congruent affect, judgement and insight appear normal  Labs   Data Reviewed: I have personally reviewed following labs and imaging studies  CBC: Recent Labs  Lab 12/10/20 0304 12/11/20 0631 12/12/20 0515  WBC 16.3* 12.6* 12.7*  NEUTROABS 13.6*  --   --   HGB 14.7 12.4 13.3  HCT 42.6 38.7 39.8  MCV 92.6  99.5 95.7  PLT 248 170 710   Basic Metabolic Panel: Recent Labs  Lab 12/10/20 0304 12/11/20 0631 12/12/20 0515  NA 141 137 137  K 3.6 3.9 3.5  CL 104 105 99  CO2 23 25 26   GLUCOSE 145* 101* 80  BUN 14 15 15   CREATININE 0.73 0.63 0.74  CALCIUM 10.3 8.4* 8.8*  MG  --   --  1.9   GFR: Estimated Creatinine Clearance: 46.9 mL/min (by C-G formula based on SCr of 0.74 mg/dL). Liver Function Tests: Recent Labs  Lab 12/10/20 0304  AST 24  ALT 16  ALKPHOS 64  BILITOT 0.6  PROT 7.9  ALBUMIN 4.9   Recent Labs  Lab 12/10/20 0304  LIPASE 19  No results for input(s): AMMONIA in the last 168 hours. Coagulation Profile: No results for input(s): INR, PROTIME in the last 168 hours. Cardiac Enzymes: No results for input(s): CKTOTAL, CKMB, CKMBINDEX, TROPONINI in the last 168 hours. BNP (last 3 results) No results for input(s): PROBNP in the last 8760 hours. HbA1C: No results for input(s): HGBA1C in the last 72 hours. CBG: No results for input(s): GLUCAP in the last 168 hours. Lipid Profile: No results for input(s): CHOL, HDL, LDLCALC, TRIG, CHOLHDL, LDLDIRECT in the last 72 hours. Thyroid Function Tests: No results for input(s): TSH, T4TOTAL, FREET4, T3FREE, THYROIDAB in the last 72 hours. Anemia Panel: No results for input(s): VITAMINB12, FOLATE, FERRITIN, TIBC, IRON, RETICCTPCT in the last 72 hours. Sepsis Labs: Recent Labs  Lab 12/10/20 0604  LATICACIDVEN 0.9    Recent Results (from the past 240 hour(s))  Resp Panel by RT-PCR (Flu A&B, Covid) Nasopharyngeal Swab     Status: None   Collection Time: 12/10/20  6:04 AM   Specimen: Nasopharyngeal Swab; Nasopharyngeal(NP) swabs in vial transport medium  Result Value Ref Range Status   SARS Coronavirus 2 by RT PCR NEGATIVE NEGATIVE Final    Comment: (NOTE) SARS-CoV-2 target nucleic acids are NOT DETECTED.  The SARS-CoV-2 RNA is generally detectable in upper respiratory specimens during the acute phase of infection. The  lowest concentration of SARS-CoV-2 viral copies this assay can detect is 138 copies/mL. A negative result does not preclude SARS-Cov-2 infection and should not be used as the sole basis for treatment or other patient management decisions. A negative result may occur with  improper specimen collection/handling, submission of specimen other than nasopharyngeal swab, presence of viral mutation(s) within the areas targeted by this assay, and inadequate number of viral copies(<138 copies/mL). A negative result must be combined with clinical observations, patient history, and epidemiological information. The expected result is Negative.  Fact Sheet for Patients:  EntrepreneurPulse.com.au  Fact Sheet for Healthcare Providers:  IncredibleEmployment.be  This test is no t yet approved or cleared by the [place] FDA and  has been authorized for detection and/or diagnosis of SARS-CoV-2 by FDA under an Emergency Use Authorization (EUA). This EUA will remain  in effect (meaning this test can be used) for the duration of the COVID-19 declaration under Section 564(b)(1) of the Act, 21 U.S.C.section 360bbb-3(b)(1), unless the authorization is terminated  or revoked sooner.       Influenza A by PCR NEGATIVE NEGATIVE Final   Influenza B by PCR NEGATIVE NEGATIVE Final    Comment: (NOTE) The Xpert Xpress SARS-CoV-2/FLU/RSV plus assay is intended as an aid in the diagnosis of influenza from Nasopharyngeal swab specimens and should not be used as a sole basis for treatment. Nasal washings and aspirates are unacceptable for Xpert Xpress SARS-CoV-2/FLU/RSV testing.  Fact Sheet for Patients: EntrepreneurPulse.com.au  Fact Sheet for Healthcare Providers: IncredibleEmployment.be  This test is not yet approved or cleared by the [place] FDA and has been authorized for detection and/or diagnosis of SARS-CoV-2 by FDA under  an Emergency Use Authorization (EUA). This EUA will remain in effect (meaning this test can be used) for the duration of the COVID-19 declaration under Section 564(b)(1) of the Act, 21 U.S.C. section 360bbb-3(b)(1), unless the authorization is terminated or revoked.  Performed at [facility], [address], [place], [place] [address]       Imaging Studies   DG Abd Portable 1V-Small Bowel Obstruction Protocol-initial, 8 hr delay  Result Date: 12/11/2020 CLINICAL DATA:  Small bowel  obstruction EXAM: PORTABLE ABDOMEN - 1 VIEW COMPARISON:  12/11/2020 FINDINGS: The enteric tube is been advanced with tip now in the right mid abdomen consistent with location in the proximal duodenum. Contrast material is demonstrated within dilated small bowel. No contrast material demonstrated in the colon. Changes are consistent with small bowel obstruction. Linear atelectasis in the lung bases. Lumbar scoliosis convex towards the left. Old right rib fractures. IMPRESSION: Enteric tube is advanced since prior study with tip likely in the duodenum. Dilated contrast filled small bowel consistent with small bowel obstruction. Electronically Signed   By: Lucienne Capers M.D.   On: 12/11/2020 20:18   DG Abd Portable 1V  Result Date: 12/11/2020 CLINICAL DATA:  Nasogastric catheter EXAM: PORTABLE ABDOMEN - 1 VIEW COMPARISON:  None. FINDINGS: Gastric catheter is noted in the distal stomach and likely into the second portion of the duodenum. Scattered large and small bowel gas is noted. Degenerative changes of lumbar spine are noted with scoliosis concave to the right at the thoracolumbar junction. IMPRESSION: Gastric catheter appears to extend into the second portion of the duodenum. Electronically Signed   By: Inez Catalina M.D.   On: 12/11/2020 08:11     Medications   Scheduled Meds:  calcium-vitamin D  1 tablet Oral BID   enoxaparin (LOVENOX) injection  40 mg Subcutaneous Q24H   Ferrous  Fumarate  1 tablet Oral Daily   gabapentin  300 mg Oral QID   Immune Globulin (Human)  10 g Subcutaneous Q Wed   levothyroxine  100 mcg Oral Q0600   lisinopril  20 mg Oral Daily   metoprolol tartrate  12.5 mg Oral BID   olopatadine  1 drop Both Eyes BID   pantoprazole (PROTONIX) IV  40 mg Intravenous Q24H   potassium chloride  10 mEq Oral Daily   Continuous Infusions:  acetaminophen 1,000 mg (12/12/20 1134)   lactated ringers     lactated ringers 75 mL/hr at 12/12/20 1628   methocarbamol (ROBAXIN) IV     promethazine (PHENERGAN) injection (IM or IVPB) 12.5 mg (12/12/20 1255)       LOS: 2 days    Time spent: 25 minutes    Ezekiel Slocumb, DO [facility]  12/12/2020, 5:44 PM      If 7PM-7AM, please contact night-coverage. How to contact the [facility] Attending or Consulting provider [place] or covering provider during after hours [place], for this patient?    Check the care team in [facility] and look for a) attending/consulting [facility] provider listed and b) the [facility] team listed Log into [email or web address] and use [place]'s universal password to access. If you do not have the password, please contact the hospital operator. Locate the [facility] provider you are looking for under [facility] and page to a number that you can be directly reached. If you still have difficulty reaching the provider, please page the [facility] (Director on Call) for the Hospitalists listed on amion for assistance.

## 2020-12-12 NOTE — Progress Notes (Signed)
Progress Note  2 Days Post-Op  Subjective: CC: still having nausea which is worsened with movement. Emesis x1 today. She has not ambulated much because of the nausea. She thinks her abdominal pain and bloating is a little worse from yesterday.  NG volume: 1350 mL  Objective: Vital signs in last 24 hours: Temp:  [98.4 F (36.9 C)-98.8 F (37.1 C)] 98.8 F (37.1 C) (06/16 0449) Pulse Rate:  [57-75] 75 (06/16 0449) Resp:  [16-20] 20 (06/16 0449) BP: (124-156)/(68-88) 156/88 (06/16 0449) SpO2:  [94 %-96 %] 95 % (06/16 0449) Last BM Date: 12/09/20  Intake/Output from previous day: 06/15 0701 - 06/16 0700 In: 2165 [I.V.:1975; NG/GT:90; IV Piggyback:100] Out: 1800 [Urine:450; Emesis/NG output:1350] Intake/Output this shift: No intake/output data recorded.  PE: General: pleasant, WD, female who is laying in bed in NAD HEENT: head is normocephalic, atraumatic. Mouth is pink and moist Heart: Palpable radial pulses bilaterally Lungs: Respiratory effort nonlabored on supplemental O2 via [place] Abd: soft, +BS, mild TTP in bilateral lower quadrants. Mild distension. NG cannister with dark green clear liquid MS: all 4 extremities are symmetrical with no cyanosis, clubbing, or edema. No calf TTP Skin: warm and dry with no masses, lesions, or rashes Psych: A&Ox3 with an appropriate affect.    Lab Results:  Recent Labs    12/11/20 0631 12/12/20 0515  WBC 12.6* 12.7*  HGB 12.4 13.3  HCT 38.7 39.8  PLT 170 200    BMET Recent Labs    12/11/20 0631 12/12/20 0515  NA 137 137  K 3.9 3.5  CL 105 99  CO2 25 26  GLUCOSE 101* 80  BUN 15 15  CREATININE 0.63 0.74  CALCIUM 8.4* 8.8*    PT/INR No results for input(s): LABPROT, INR in the last 72 hours. CMP     Component Value Date/Time   NA 137 12/12/2020 0515   NA 143 11/26/2017 1159   NA 143 06/16/2017 0921   K 3.5 12/12/2020 0515   K 3.8 06/16/2017 0921   CL 99 12/12/2020 0515   CL 109 (H) 12/14/2012 1009   CO2 26  12/12/2020 0515   CO2 28 06/16/2017 0921   GLUCOSE 80 12/12/2020 0515   GLUCOSE 69 (L) 06/16/2017 0921   GLUCOSE 64 (L) 12/14/2012 1009   BUN 15 12/12/2020 0515   BUN 18 11/26/2017 1159   BUN 11.7 06/16/2017 0921   CREATININE 0.74 12/12/2020 0515   CREATININE 0.80 06/14/2020 1335   CREATININE 0.8 06/16/2017 0921   CALCIUM 8.8 (L) 12/12/2020 0515   CALCIUM 9.7 06/16/2017 0921   PROT 7.9 12/10/2020 0304   PROT 7.5 06/16/2017 0921   ALBUMIN 4.9 12/10/2020 0304   ALBUMIN 3.9 06/16/2017 0921   AST 24 12/10/2020 0304   AST 28 06/14/2020 1335   AST 23 06/16/2017 0921   ALT 16 12/10/2020 0304   ALT 19 06/14/2020 1335   ALT 18 06/16/2017 0921   ALKPHOS 64 12/10/2020 0304   ALKPHOS 63 06/16/2017 0921   BILITOT 0.6 12/10/2020 0304   BILITOT 0.6 06/14/2020 1335   BILITOT 0.52 06/16/2017 0921   GFRNONAA >60 12/12/2020 0515   GFRNONAA >60 06/14/2020 1335   GFRNONAA >60 10/16/2010 1013   GFRAA >60 06/16/2019 1030   GFRAA >60 10/16/2010 1013   Lipase     Component Value Date/Time   LIPASE 19 12/10/2020 0304       Studies/Results: DG Abd Portable 1V-Small Bowel Obstruction Protocol-initial, 8 hr delay  Result Date: 12/11/2020 CLINICAL DATA:  Small bowel obstruction EXAM: PORTABLE ABDOMEN - 1 VIEW COMPARISON:  12/11/2020 FINDINGS: The enteric tube is been advanced with tip now in the right mid abdomen consistent with location in the proximal duodenum. Contrast material is demonstrated within dilated small bowel. No contrast material demonstrated in the colon. Changes are consistent with small bowel obstruction. Linear atelectasis in the lung bases. Lumbar scoliosis convex towards the left. Old right rib fractures. IMPRESSION: Enteric tube is advanced since prior study with tip likely in the duodenum. Dilated contrast filled small bowel consistent with small bowel obstruction. Electronically Signed   By: Lucienne Capers M.D.   On: 12/11/2020 20:18   DG Abd Portable 1V  Result Date:  12/11/2020 CLINICAL DATA:  Nasogastric catheter EXAM: PORTABLE ABDOMEN - 1 VIEW COMPARISON:  None. FINDINGS: Gastric catheter is noted in the distal stomach and likely into the second portion of the duodenum. Scattered large and small bowel gas is noted. Degenerative changes of lumbar spine are noted with scoliosis concave to the right at the thoracolumbar junction. IMPRESSION: Gastric catheter appears to extend into the second portion of the duodenum. Electronically Signed   By: Inez Catalina M.D.   On: 12/11/2020 08:11   DG Naso G Tube Plc W/Fl W/Rad  Result Date: 12/10/2020 CLINICAL DATA:  nasogastric tube placement EXAM: NASO G TUBE PLACEMENT WITH FL AND WITH RAD CONTRAST:  Light barium FLUOROSCOPY TIME:  Fluoroscopy Time:  3 minutes 12 seconds Radiation Exposure Index (if provided by the fluoroscopic device): 6.2 mGy Number of Acquired Spot Images: 0 COMPARISON:  Chest CT 06/14/2020 FINDINGS: Nasogastric tube placement was attempted utilizing fluoroscopic guidance. A 12 French nasogastric tube was inserted into left nostril and passed to level of C7/T1, where there was abrupt resistance and kinking of the nasogastric tube. A wire was placed through the nasogastric tube, and additional attempts were made to pass the tube further into the esophagus, without success. A small amount of light barium was injected through the tube which passed into the lower esophagus. A 12 French nasogastric tube was then removed and an attempt was made to pass a 10 French nasogastric tube. Again, the nasogastric tube met abrupt resistance at the level of C7/T1, with kinking of the tube. IMPRESSION: Unsuccessful nasogastric tube placement under fluoroscopic guidance despite multiple attempts and technique. Abrupt and persistent resistance was met at the level of C7/T1 on every attempt. Consider CT of the neck/chest to rule out an obstructing lesion. These results were called by telephone at the time of interpretation on 12/10/2020  at 3:20 pm to provider [facility] , who verbally acknowledged these results. Electronically Signed   By: Maurine Simmering   On: 12/10/2020 15:22    Anti-infectives: Anti-infectives (From admission, onward)    None        Assessment/Plan SBO - PMH partial hysterectomy 1980's - afebrile, VSS  - WBC 12.7 (12.6) - CT A/P W/ Contrast 6/14 significant for SBO with possible transition point in the distal ileum, large hiatal hernia.  - NG tube placement failed with flouro and placed by GI with endoscopy - SBO protocol - xray abd last night with contrast in SB. Follow repeat xray today. Keep NG to LIWS today - ambulate, incentive spirometer - hopeful for improvement on protocol. No urgent/emergent indication for surgery at this time.  FEN: NPO, NG LIWS ID: none VTE: SCDs, lovenox   Per primary team: Hiatal Hernia HTN Hypothyroidism A.fib Permanent pacemaker placement 2013 PAD NHL in remission    LOS:  2 days    [facility] 12/12/2020, 7:48 AM Please see Amion for pager number during day hours 7:00am-4:30pm

## 2020-12-12 NOTE — Progress Notes (Signed)
Patient ID: [name], female   DOB: [birth year], 86 y.o.   MRN: [id number] See other note. This afternoon she is feeling somewhat better. +Flatus. Abd some distention but non-tender. Will check F/U abd x-ray. Georganna Skeans, MD, MPH, FACS Please use [email or web address] to contact on call provider

## 2020-12-13 MED ORDER — DIPHENHYDRAMINE HCL 50 MG/ML IJ SOLN
25.0000 mg | Freq: Once | INTRAMUSCULAR | Status: AC | PRN
  Administered 2020-12-13: 25 mg via INTRAVENOUS
  Filled 2020-12-13: qty 1

## 2020-12-13 MED ORDER — ACETAMINOPHEN 10 MG/ML IV SOLN: 1000.0000 mg | Freq: Four times a day (QID) | INTRAVENOUS | Status: DC

## 2020-12-13 MED ORDER — DIPHENHYDRAMINE HCL 25 MG PO CAPS: 25.0000 mg | ORAL_CAPSULE | Freq: Once | ORAL | Status: DC | PRN

## 2020-12-13 MED ORDER — ACETAMINOPHEN 10 MG/ML IV SOLN
1000.0000 mg | Freq: Four times a day (QID) | INTRAVENOUS | Status: AC | PRN
  Administered 2020-12-13 – 2020-12-14 (×2): 1000 mg via INTRAVENOUS
  Filled 2020-12-13 (×2): qty 100

## 2020-12-13 NOTE — Care Management Important Message (Signed)
Important Message  Patient Details  Name: [name] MRN: [id number] Date of Birth: 1935-04-18   Medicare Important Message Given:  Yes - Important Message mailed due to current National Emergency   Verbal consent obtained due to current National Emergency  Relationship to patient: Self Contact Name: Ruba Call Date: 12/13/20  Time: 0954 Phone: [phone] Outcome: No Answer/Busy Important Message mailed to: Patient address on file    Delorse Lek 12/13/2020, 9:54 AM

## 2020-12-13 NOTE — Progress Notes (Signed)
RN went into the room and stated that she has been nauseous it started about 15 minutes ago. Pt did not call out about the nausea.

## 2020-12-13 NOTE — Progress Notes (Signed)
Pt NG tube has been clamped since 9am this morning, pt has tolerated well. No nausea no vomiting. Spoke with Dr. Grandville Silos and updated him and he stated to pull the Pt's NG tube and place on a clear liquid diet.

## 2020-12-13 NOTE — Progress Notes (Signed)
PROGRESS NOTE    [name]   DGU:[id number]  DOB: 06-18-1935  PCP: Janith Lima, MD    DOA: 12/10/2020 LOS: 3   Assessment & Plan   Principal Problem:   SBO (small bowel obstruction) ([place]) Active Problems:   Hypothyroidism   Essential hypertension   Pacemaker   Grade 3 follicular lymphoma of lymph nodes of neck (HCC)   PAD (peripheral artery disease) (HCC)   SBO - in setting of distant partial hysterectomy (1980's).  CT abdomen/pelvis showed SBO with possible transition point in the distal ileum.   --SBO protocol --NG tube clamped this AM --IV med substitutes for now --IV antiemetics - Zofran, Phenergan 2nd line --Pain control PRN - IV Tylenol --Ambulate --NPO, ice chips - diet per surgery --IV fluids  Palpitations - overnight 6/16-17.  PM in place.  HR's okay. --Transfer to telemetry to monitor  Large hiatal hernia - likely caused difficulty with NG placement. Monitor.  CT neck cancelled.  CVID - daughter brought patient's Hizentra which she gets 10 g weekly.  Hx of recurrent sinopulmonary infections, occasional GI symptoms which are quite different than current presentation. --Hizentra given 6/15  Non-hodgin's Lymphoma / Follicular - follow with Dr. Julien Nordmann, last visit Dec 2021.  No acute issues.  Monitor.    PAD - Follows with Dr. Stanford Breed, last seen in Dec 2021, was to start ASA and statin.  These not on her med history(?) --Outpatient follow up  Pacemaker in place -on admission, patient inquired about interrogation of device.  Daughter to bring in [place] card with contact info.  A. Fib -rate controlled, as needed IV Lopressor for now while n.p.o.  Appears not on anticoagulation.  History of hypertension -continue regimen appears lisinopril and Lopressor, if tolerating oral meds.  As needed IV Lopressor..  Hypothyroidism -resume Synthroid at current dose when resuming p.o.    Patient BMI: Body mass index is 24.8 kg/m.    DVT  prophylaxis: enoxaparin (LOVENOX) injection 40 mg Start: 12/11/20 2000 SCDs Start: 12/10/20 1059   Diet:  Diet Orders (From admission, onward)     Start     Ordered   12/11/20 0836  Diet NPO time specified Except for: Ice Chips, Sips with Meds  Diet effective now       Question Answer Comment  Except for Ice Chips   Except for Sips with Meds      12/11/20 0835              Code Status: Full Code   Brief Narrative / Hospital Course to Date:   Per H&P by Dr. Lorin Mercy: "[name] is a 86 y.o. female with medical history significant of afib;  HTN; hypothyroidism; pacemaker placement; PAD; CVID; and NHL in remission presenting with abdominal pain.  She had a normal day yesterday and ate a small but regular dinner last night.  She had crampy abdominal pain starting about 2030 and had a normal BM about 2100.  The pain continued to escalate and she developed n/v.  Despite multiple (now 6) attempts, NG tube placement has been unsuccessful.  She is feeling mildly better, though, and has not vomited since she was at [facility] (transferred ER:ER).  She previously had a vaginal partial hysterectomy but has not otherwise had abdominal surgery.   ED Course: Sent [facility]CD[facility] to [facility].  SBO with concern for vascular lymphatic compromise, surgery will see but requests [facility] admission."  NG tube placement proved very challenging, with 6 unsuccessful attempts.  Ultimately, GI was able to place NG tube via endoscopy.  Patient's large hiatal hernia felt to be the complicating factor.      Subjective 12/13/20    Patient seen with daughter at bedside.  Pt continues to have significant nausea and abdominal pain.  She feels nausea may get better when NG tube comes out.  No sleep overnight bc of beeping SCD machine.  Uncontrolled pain overnight.  Pt reports having frequent palpitations overnight.  Disposition Plan & Communication   Status is: Inpatient  Remains inpatient appropriate because:IV treatments  appropriate due to intensity of illness or inability to take PO  Dispo: The patient is from: Home              Anticipated d/c is to: Home              Patient currently is not medically stable to d/c.   Difficult to place patient No  Family Communication: Daughter at bedside on rounds 6/15, 6/16, 6/17   Consults, Procedures, Significant Events   Consultants:  General surgery Gastroenterology  Procedures:  NG tube placement under endoscopic guidance by GI  Antimicrobials:  Anti-infectives (From admission, onward)    None         Micro    Objective   Vitals:   12/12/20 2211 12/13/20 0508 12/13/20 1203 12/13/20 1801  BP: 126/70 (!) 156/80 (!) 159/71 (!) 164/80  Pulse: 65 72 74 78  Resp: 18 16 16 16   Temp: 98.5 F (36.9 C) 98.1 F (36.7 C) 98.9 F (37.2 C) 98.8 F (37.1 C)  TempSrc: Oral Oral Oral Oral  SpO2: 95% 94% 99% 98%  Weight:      Height:        Intake/Output Summary (Last 24 hours) at 12/13/2020 2032 Last data filed at 12/13/2020 1503 Gross per 24 hour  Intake 2049.54 ml  Output 200 ml  Net 1849.54 ml   Filed Weights   12/10/20 0259  Weight: 63.5 kg    Physical Exam:  General exam: awake, alert, no acute distress HEENT: NG tube in place clamped, hearing grossly normal Respiratory system: normal respiratory effort, on room air. Cardiovascular system: RRR, nno pedal edema.   Gastrointestinal system: soft, mildly tender, non-distended, +bowel sounds Central nervous system: A&O x3. Grossly non-focal exam, normal speech  Labs   Data Reviewed: I have personally reviewed following labs and imaging studies  CBC: Recent Labs  Lab 12/10/20 0304 12/11/20 0631 12/12/20 0515  WBC 16.3* 12.6* 12.7*  NEUTROABS 13.6*  --   --   HGB 14.7 12.4 13.3  HCT 42.6 38.7 39.8  MCV 92.6 99.5 95.7  PLT 248 170 889   Basic Metabolic Panel: Recent Labs  Lab 12/10/20 0304 12/11/20 0631 12/12/20 0515  NA 141 137 137  K 3.6 3.9 3.5  CL 104 105 99  CO2  23 25 26   GLUCOSE 145* 101* 80  BUN 14 15 15   CREATININE 0.73 0.63 0.74  CALCIUM 10.3 8.4* 8.8*  MG  --   --  1.9   GFR: Estimated Creatinine Clearance: 46.9 mL/min (by C-G formula based on SCr of 0.74 mg/dL). Liver Function Tests: Recent Labs  Lab 12/10/20 0304  AST 24  ALT 16  ALKPHOS 64  BILITOT 0.6  PROT 7.9  ALBUMIN 4.9   Recent Labs  Lab 12/10/20 0304  LIPASE 19   No results for input(s): AMMONIA in the last 168 hours. Coagulation Profile: No results for input(s): INR, PROTIME in the last  168 hours. Cardiac Enzymes: No results for input(s): CKTOTAL, CKMB, CKMBINDEX, TROPONINI in the last 168 hours. BNP (last 3 results) No results for input(s): PROBNP in the last 8760 hours. HbA1C: No results for input(s): HGBA1C in the last 72 hours. CBG: No results for input(s): GLUCAP in the last 168 hours. Lipid Profile: No results for input(s): CHOL, HDL, LDLCALC, TRIG, CHOLHDL, LDLDIRECT in the last 72 hours. Thyroid Function Tests: No results for input(s): TSH, T4TOTAL, FREET4, T3FREE, THYROIDAB in the last 72 hours. Anemia Panel: No results for input(s): VITAMINB12, FOLATE, FERRITIN, TIBC, IRON, RETICCTPCT in the last 72 hours. Sepsis Labs: Recent Labs  Lab 12/10/20 0604  LATICACIDVEN 0.9    Recent Results (from the past 240 hour(s))  Resp Panel by RT-PCR (Flu A&B, Covid) Nasopharyngeal Swab     Status: None   Collection Time: 12/10/20  6:04 AM   Specimen: Nasopharyngeal Swab; Nasopharyngeal(NP) swabs in vial transport medium  Result Value Ref Range Status   SARS Coronavirus 2 by RT PCR NEGATIVE NEGATIVE Final    Comment: (NOTE) SARS-CoV-2 target nucleic acids are NOT DETECTED.  The SARS-CoV-2 RNA is generally detectable in upper respiratory specimens during the acute phase of infection. The lowest concentration of SARS-CoV-2 viral copies this assay can detect is 138 copies/mL. A negative result does not preclude SARS-Cov-2 infection and should not be used as  the sole basis for treatment or other patient management decisions. A negative result may occur with  improper specimen collection/handling, submission of specimen other than nasopharyngeal swab, presence of viral mutation(s) within the areas targeted by this assay, and inadequate number of viral copies(<138 copies/mL). A negative result must be combined with clinical observations, patient history, and epidemiological information. The expected result is Negative.  Fact Sheet for Patients:  EntrepreneurPulse.com.au  Fact Sheet for Healthcare Providers:  IncredibleEmployment.be  This test is no t yet approved or cleared by the [place] FDA and  has been authorized for detection and/or diagnosis of SARS-CoV-2 by FDA under an Emergency Use Authorization (EUA). This EUA will remain  in effect (meaning this test can be used) for the duration of the COVID-19 declaration under Section 564(b)(1) of the Act, 21 U.S.C.section 360bbb-3(b)(1), unless the authorization is terminated  or revoked sooner.       Influenza A by PCR NEGATIVE NEGATIVE Final   Influenza B by PCR NEGATIVE NEGATIVE Final    Comment: (NOTE) The Xpert Xpress SARS-CoV-2/FLU/RSV plus assay is intended as an aid in the diagnosis of influenza from Nasopharyngeal swab specimens and should not be used as a sole basis for treatment. Nasal washings and aspirates are unacceptable for Xpert Xpress SARS-CoV-2/FLU/RSV testing.  Fact Sheet for Patients: EntrepreneurPulse.com.au  Fact Sheet for Healthcare Providers: IncredibleEmployment.be  This test is not yet approved or cleared by the [place] FDA and has been authorized for detection and/or diagnosis of SARS-CoV-2 by FDA under an Emergency Use Authorization (EUA). This EUA will remain in effect (meaning this test can be used) for the duration of the COVID-19 declaration under Section 564(b)(1) of  the Act, 21 U.S.C. section 360bbb-3(b)(1), unless the authorization is terminated or revoked.  Performed at [facility], [address] [place], [place] [address]       Imaging Studies   DG Abd Portable 1V-Small Bowel Obstruction Protocol-24 hr delay  Result Date: 12/12/2020 CLINICAL DATA:  86 year old female with abdominal pain, nausea vomiting. EXAM: PORTABLE ABDOMEN - 1 VIEW COMPARISON:  Abdominal radiograph dated 12/11/2020. FINDINGS: Enteric tube with tip  in the right lower abdomen in similar position. There is no bowel dilatation or evidence of obstruction. No free air or radiopaque calculi. There is osteopenia with severe scoliosis and degenerative changes of the spine. No acute osseous pathology. IMPRESSION: Similar positioning of the enteric tube. No evidence of bowel obstruction. Electronically Signed   By: Anner Crete M.D.   On: 12/12/2020 21:19     Medications   Scheduled Meds:  calcium-vitamin D  1 tablet Oral BID   enoxaparin (LOVENOX) injection  40 mg Subcutaneous Q24H   Ferrous Fumarate  1 tablet Oral Daily   gabapentin  300 mg Oral QID   Immune Globulin (Human)  10 g Subcutaneous Q Wed   levothyroxine  100 mcg Oral Q0600   lisinopril  20 mg Oral Daily   metoprolol tartrate  12.5 mg Oral BID   olopatadine  1 drop Both Eyes BID   pantoprazole (PROTONIX) IV  40 mg Intravenous Q24H   potassium chloride  10 mEq Oral Daily   Continuous Infusions:  acetaminophen Stopped (12/13/20 1000)   lactated ringers     lactated ringers 75 mL/hr at 12/13/20 1503   methocarbamol (ROBAXIN) IV     promethazine (PHENERGAN) injection (IM or IVPB) Stopped (12/13/20 0912)       LOS: 3 days    Time spent: 25 minutes with > 50% spent at bedside and in coordination of care.    Ezekiel Slocumb, DO [facility]  12/13/2020, 8:32 PM      If 7PM-7AM, please contact night-coverage. How to contact the [facility] Attending or Consulting provider [place] or covering provider during after hours [place], for this patient?    Check the care team in [facility] and look for a) attending/consulting [facility] provider listed and b) the [facility] team listed Log into [email or web address] and use [place]'s universal password to access. If you do not have the password, please contact the hospital operator. Locate the [facility] provider you are looking for under [facility] and page to a number that you can be directly reached. If you still have difficulty reaching the provider, please page the [facility] (Director on Call) for the Hospitalists listed on amion for assistance.

## 2020-12-13 NOTE — Progress Notes (Signed)
3 Days Post-Op   Subjective: Passing gas, no BM, C/O mouth/tooth pain and spray makes her nauseated ROS negative except as listed above. Objective: Vital signs in last 24 hours: Temp:  [98.1 F (36.7 C)-98.7 F (37.1 C)] 98.1 F (36.7 C) (06/17 0508) Pulse Rate:  [65-75] 72 (06/17 0508) Resp:  [16-18] 16 (06/17 0508) BP: (126-156)/(63-80) 156/80 (06/17 0508) SpO2:  [94 %-98 %] 94 % (06/17 0508) Last BM Date: 12/09/20  Intake/Output from previous day: 06/16 0701 - 06/17 0700 In: 1773.2 [I.V.:1723.2; IV Piggyback:50] Out: 800 [Urine:200; Emesis/NG output:600] Intake/Output this shift: No intake/output data recorded.  General appearance: alert Lungs CTA CV reg Abd less distended, soft, NT  Lab Results: CBC  Recent Labs    12/11/20 0631 12/12/20 0515  WBC 12.6* 12.7*  HGB 12.4 13.3  HCT 38.7 39.8  PLT 170 200   BMET Recent Labs    12/11/20 0631 12/12/20 0515  NA 137 137  K 3.9 3.5  CL 105 99  CO2 25 26  GLUCOSE 101* 80  BUN 15 15  CREATININE 0.63 0.74  CALCIUM 8.4* 8.8*   PT/INR No results for input(s): LABPROT, INR in the last 72 hours. ABG No results for input(s): PHART, HCO3 in the last 72 hours.  Invalid input(s): PCO2, PO2  Studies/Results: DG Abd Portable 1V-Small Bowel Obstruction Protocol-24 hr delay  Result Date: 12/12/2020 CLINICAL DATA:  86 year old female with abdominal pain, nausea vomiting. EXAM: PORTABLE ABDOMEN - 1 VIEW COMPARISON:  Abdominal radiograph dated 12/11/2020. FINDINGS: Enteric tube with tip in the right lower abdomen in similar position. There is no bowel dilatation or evidence of obstruction. No free air or radiopaque calculi. There is osteopenia with severe scoliosis and degenerative changes of the spine. No acute osseous pathology. IMPRESSION: Similar positioning of the enteric tube. No evidence of bowel obstruction. Electronically Signed   By: Anner Crete M.D.   On: 12/12/2020 21:19   DG Abd Portable 1V-Small Bowel  Obstruction Protocol-initial, 8 hr delay  Result Date: 12/11/2020 CLINICAL DATA:  Small bowel obstruction EXAM: PORTABLE ABDOMEN - 1 VIEW COMPARISON:  12/11/2020 FINDINGS: The enteric tube is been advanced with tip now in the right mid abdomen consistent with location in the proximal duodenum. Contrast material is demonstrated within dilated small bowel. No contrast material demonstrated in the colon. Changes are consistent with small bowel obstruction. Linear atelectasis in the lung bases. Lumbar scoliosis convex towards the left. Old right rib fractures. IMPRESSION: Enteric tube is advanced since prior study with tip likely in the duodenum. Dilated contrast filled small bowel consistent with small bowel obstruction. Electronically Signed   By: Lucienne Capers M.D.   On: 12/11/2020 20:18    Anti-infectives: Anti-infectives (From admission, onward)    None       Assessment/Plan: Patient ID: [name], female   DOB: [birth year], 86 y.o.   MRN: [id number]  SBO -F/U film no SBO -clamp NGT and remove this PM if tolerates -I spoke with her daughter  Mouth/tooth pain -viscoous lidocaine PRN  FEN: NPO, NG LIWS ID: none VTE: SCDs, lovenox   Per primary team: Hiatal Hernia HTN Hypothyroidism A.fib Permanent pacemaker placement 2013 PAD NHL in remission     LOS: 3 days    Georganna Skeans, MD, MPH, FACS Trauma & General Surgery Use [email or web address] to contact on call provider  12/13/2020 Patient ID: [name], female   DOB: 1935-06-05, 86 y.o.   MRN: [id number]

## 2020-12-14 ENCOUNTER — Inpatient Hospital Stay (HOSPITAL_COMMUNITY): Payer: Medicare Other

## 2020-12-14 LAB — BASIC METABOLIC PANEL
Anion gap: 11 (ref 5–15)
BUN: 9 mg/dL (ref 8–23)
CO2: 19 mmol/L — ABNORMAL LOW (ref 22–32)
Calcium: 8.4 mg/dL — ABNORMAL LOW (ref 8.9–10.3)
Chloride: 107 mmol/L (ref 98–111)
Creatinine, Ser: 0.75 mg/dL (ref 0.44–1.00)
GFR, Estimated: >60 mL/min (ref >60)
Glucose, Bld: 79 mg/dL (ref 70–99)
Potassium: 4 mmol/L (ref 3.5–5.1)
Sodium: 137 mmol/L (ref 135–145)

## 2020-12-14 LAB — MAGNESIUM: Magnesium: 1.9 mg/dL (ref 1.7–2.4)

## 2020-12-14 LAB — CBC
HCT: 39.8 % (ref 36.0–46.0)
Hemoglobin: 13 g/dL (ref 12.0–15.0)
MCH: 31.8 pg (ref 26.0–34.0)
MCHC: 32.7 g/dL (ref 30.0–36.0)
MCV: 97.3 fL (ref 80.0–100.0)
Platelets: 225 10*3/uL (ref 150–400)
RBC: 4.09 MIL/uL (ref 3.87–5.11)
RDW: 13.3 % (ref 11.5–15.5)
WBC: 9.8 10*3/uL (ref 4.0–10.5)
nRBC: 0 % (ref 0.0–0.2)

## 2020-12-14 MED ORDER — DEXTROSE IN LACTATED RINGERS 5 % IV SOLN: INTRAVENOUS | Status: DC

## 2020-12-14 MED ORDER — LEVOTHYROXINE SODIUM 100 MCG/5ML IV SOLN
50.0000 ug | Freq: Every day | INTRAVENOUS | Status: DC
  Administered 2020-12-14: 50 ug via INTRAVENOUS
  Filled 2020-12-14 (×2): qty 5

## 2020-12-14 MED ORDER — SODIUM CHLORIDE 0.9 % IV SOLN
12.5000 mg | Freq: Four times a day (QID) | INTRAVENOUS | Status: DC
  Administered 2020-12-14 – 2020-12-15 (×4): 12.5 mg via INTRAVENOUS
  Filled 2020-12-14 (×4): qty 0.5
  Filled 2020-12-14 (×2): qty 12.5
  Filled 2020-12-14 (×6): qty 0.5

## 2020-12-14 MED ORDER — PANTOPRAZOLE SODIUM 40 MG PO PACK
40.0000 mg | PACK | Freq: Every day | ORAL | Status: DC
  Filled 2020-12-14: qty 20

## 2020-12-14 MED ORDER — PANTOPRAZOLE SODIUM 40 MG IV SOLR
40.0000 mg | INTRAVENOUS | Status: DC
  Administered 2020-12-14 – 2020-12-15 (×2): 40 mg via INTRAVENOUS
  Filled 2020-12-14 (×2): qty 40

## 2020-12-14 MED ORDER — POTASSIUM CHLORIDE 20 MEQ PO PACK
20.0000 meq | PACK | Freq: Every day | ORAL | Status: DC
  Filled 2020-12-14: qty 1

## 2020-12-14 MED ORDER — POTASSIUM CHLORIDE 10 MEQ/100ML IV SOLN
10.0000 meq | INTRAVENOUS | Status: AC
  Administered 2020-12-14 (×2): 10 meq via INTRAVENOUS
  Filled 2020-12-14 (×2): qty 100

## 2020-12-14 MED ORDER — METOPROLOL TARTRATE 5 MG/5ML IV SOLN
5.0000 mg | Freq: Three times a day (TID) | INTRAVENOUS | Status: DC | PRN
  Filled 2020-12-14: qty 5

## 2020-12-14 MED ORDER — METOPROLOL TARTRATE 25 MG/10 ML ORAL SUSPENSION
12.5000 mg | Freq: Two times a day (BID) | ORAL | Status: DC
  Administered 2020-12-14: 12.5 mg via ORAL
  Filled 2020-12-14 (×2): qty 5

## 2020-12-14 MED ORDER — FERROUS SULFATE 220 (44 FE) MG/5ML PO ELIX: 220.0000 mg | ORAL_SOLUTION | Freq: Every day | ORAL | Status: DC

## 2020-12-14 NOTE — Progress Notes (Addendum)
4 Days Post-Op  Subjective: CC: Patient reports constant nausea. She feels this is from the NGT. 500cc output in the last 24 hours from ngt. Bilious output in cannister. She reports generalized soreness of the abdomen with mild bloating. Passing flatus > 5 x yesterday. No BM. Mobilizing in room.   Objective: Vital signs in last 24 hours: Temp:  [98.2 F (36.8 C)-98.9 F (37.2 C)] 98.7 F (37.1 C) (06/18 0544) Pulse Rate:  [69-78] 73 (06/18 0544) Resp:  [14-17] 17 (06/18 0544) BP: (131-164)/(71-80) 150/79 (06/18 0544) SpO2:  [94 %-99 %] 96 % (06/18 0544) Last BM Date: 12/13/20  Intake/Output from previous day: 06/17 0701 - 06/18 0700 In: 1065 [I.V.:565; IV Piggyback:500] Out: 1001 [Urine:500; Emesis/NG output:501] Intake/Output this shift: No intake/output data recorded.  PE: Gen:  Alert, NAD, pleasant Abd: Mild to moderate lower abdominal distension but soft. Generalized tenderness without peritonitis. Some bowel sounds but hypoactive. NGT in place w/ bilious output in cannister.  Psych: A&Ox3 Skin: no rashes noted, warm and dry   Lab Results:  Recent Labs    12/12/20 0515 12/14/20 0619  WBC 12.7* 9.8  HGB 13.3 13.0  HCT 39.8 39.8  PLT 200 225   BMET Recent Labs    12/12/20 0515 12/14/20 0619  NA 137 137  K 3.5 4.0  CL 99 107  CO2 26 19*  GLUCOSE 80 79  BUN 15 9  CREATININE 0.74 0.75  CALCIUM 8.8* 8.4*   PT/INR No results for input(s): LABPROT, INR in the last 72 hours. CMP     Component Value Date/Time   NA 137 12/14/2020 0619   NA 143 11/26/2017 1159   NA 143 06/16/2017 0921   K 4.0 12/14/2020 0619   K 3.8 06/16/2017 0921   CL 107 12/14/2020 0619   CL 109 (H) 12/14/2012 1009   CO2 19 (L) 12/14/2020 0619   CO2 28 06/16/2017 0921   GLUCOSE 79 12/14/2020 0619   GLUCOSE 69 (L) 06/16/2017 0921   GLUCOSE 64 (L) 12/14/2012 1009   BUN 9 12/14/2020 0619   BUN 18 11/26/2017 1159   BUN 11.7 06/16/2017 0921   CREATININE 0.75 12/14/2020 0619    CREATININE 0.80 06/14/2020 1335   CREATININE 0.8 06/16/2017 0921   CALCIUM 8.4 (L) 12/14/2020 0619   CALCIUM 9.7 06/16/2017 0921   PROT 7.9 12/10/2020 0304   PROT 7.5 06/16/2017 0921   ALBUMIN 4.9 12/10/2020 0304   ALBUMIN 3.9 06/16/2017 0921   AST 24 12/10/2020 0304   AST 28 06/14/2020 1335   AST 23 06/16/2017 0921   ALT 16 12/10/2020 0304   ALT 19 06/14/2020 1335   ALT 18 06/16/2017 0921   ALKPHOS 64 12/10/2020 0304   ALKPHOS 63 06/16/2017 0921   BILITOT 0.6 12/10/2020 0304   BILITOT 0.6 06/14/2020 1335   BILITOT 0.52 06/16/2017 0921   GFRNONAA >60 12/14/2020 0619   GFRNONAA >60 06/14/2020 1335   GFRNONAA >60 10/16/2010 1013   GFRAA >60 06/16/2019 1030   GFRAA >60 10/16/2010 1013   Lipase     Component Value Date/Time   LIPASE 19 12/10/2020 0304       Studies/Results: DG Abd Portable 1V-Small Bowel Obstruction Protocol-24 hr delay  Result Date: 12/12/2020 CLINICAL DATA:  86 year old female with abdominal pain, nausea vomiting. EXAM: PORTABLE ABDOMEN - 1 VIEW COMPARISON:  Abdominal radiograph dated 12/11/2020. FINDINGS: Enteric tube with tip in the right lower abdomen in similar position. There is no bowel dilatation or evidence of obstruction.  No free air or radiopaque calculi. There is osteopenia with severe scoliosis and degenerative changes of the spine. No acute osseous pathology. IMPRESSION: Similar positioning of the enteric tube. No evidence of bowel obstruction. Electronically Signed   By: Anner Crete M.D.   On: 12/12/2020 21:19    Anti-infectives: Anti-infectives (From admission, onward)    None        Assessment/Plan SBO - Xray pending this am. Patient passing flatus but still nauseated. Failed clamping trial yesterday. NGT out 500cc last 24 hours. Will discuss plan with MD. If fails to improve may need OR.  - Addendum: discussed with MD. Will try clamping trial and cld - Mobilize for bowel function - Keep K > 4 and Mg > 2 for bowel  function  Mouth/tooth pain -viscoous lidocaine PRN   FEN: NPO, NG LIWS ID: none VTE: SCDs, lovenox   Per primary team: Hiatal Hernia HTN Hypothyroidism A.fib Permanent pacemaker placement 2013 PAD NHL in remission   LOS: 4 days    Jillyn Ledger , [facility] 12/14/2020, 9:13 AM Please see Amion for pager number during day hours 7:00am-4:30pm

## 2020-12-14 NOTE — Progress Notes (Signed)
Paged by Dr. Arbutus Ped regarding device interrogation for palpitation, I spoke with Nils Flack of [employer] who will come to do the interrogation either today or tomorrow.

## 2020-12-14 NOTE — Progress Notes (Addendum)
PROGRESS NOTE    [name]   IWL:[id number]  DOB: 1934/07/16  PCP: Janith Lima, MD    DOA: 12/10/2020 LOS: 4   Assessment & Plan   Principal Problem:   SBO (small bowel obstruction) ([place]) Active Problems:   Hypothyroidism   Essential hypertension   Pacemaker   Grade 3 follicular lymphoma of lymph nodes of neck (HCC)   PAD (peripheral artery disease) (HCC)   SBO - in setting of distant partial hysterectomy (1980's).  CT abdomen/pelvis showed SBO with possible transition point in the distal ileum.   6/17 - trial of NGT clamped unsuccessful, returned to suction 6/18 - +flatus, improving NG output --General surgery managing --SBO protocol --NG tube removed this afternoon --Trying on clear liquids --IV med substitutes for now until clearly tolerating PO --IV antiemetics - scheduled Phenergan, PRN Zofran --Pain control PRN - IV Tylenol --Ambulate --Will need to address nutrition very soon in not able to tolerate PO --IV fluids - change to D5-LR   Oral Pain / Throat pain - oral pain reports at back upper molar since scope for NGT placement.  Unable to see any trauma on my exam.   Throat pain 2/2 NG tube. --Will request dental evaluation --Chloraseptic spray, viscous lidocaine  Palpitations - overnight 6/16-17.  PM in place.  HR's okay. --Telemetry to monitor --[employer] rep to come interrogate PM later today or tomorrow  Large hiatal hernia - likely caused difficulty with NG placement. Monitor.  CT neck cancelled.  CVID - daughter brought patient's Hizentra which she gets 10 g weekly.  Hx of recurrent sinopulmonary infections, occasional GI symptoms which are quite different than current presentation. --Hizentra given 6/15  Non-hodgin's Lymphoma / Follicular - follow with Dr. Julien Nordmann, last visit Dec 2021.  No acute issues.  Monitor.    PAD - Follows with Dr. Stanford Breed, last seen in Dec 2021, was to start ASA and statin.  These not on her med  history(?) --Outpatient follow up  Pacemaker in place -on admission, patient inquired about interrogation of device.  Daughter to bring in [facility] card with contact info.  A. Fib -rate controlled, as needed IV Lopressor for now while n.p.o.  Appears not on anticoagulation.  History of hypertension -continue regimen appears lisinopril and Lopressor, if tolerating oral meds.  As needed IV Lopressor..  Hypothyroidism -resume Synthroid at current dose when resuming p.o.    Patient BMI: Body mass index is 24.8 kg/m.    DVT prophylaxis: enoxaparin (LOVENOX) injection 40 mg Start: 12/11/20 2000 SCDs Start: 12/10/20 1059   Diet:  Diet Orders (From admission, onward)     Start     Ordered   12/14/20 1146  Diet clear liquid Room service appropriate? Yes; Fluid consistency: Thin  Diet effective now       Question Answer Comment  Room service appropriate? Yes   Fluid consistency: Thin      12/14/20 1145              Code Status: Full Code   Brief Narrative / Hospital Course to Date:   Per H&P by Dr. Lorin Mercy: "JUDEE Foster is a 86 y.o. female with medical history significant of afib;  HTN; hypothyroidism; pacemaker placement; PAD; CVID; and NHL in remission presenting with abdominal pain.  She had a normal day yesterday and ate a small but regular dinner last night.  She had crampy abdominal pain starting about 2030 and had a normal BM about 2100.  The pain continued  to escalate and she developed n/v.  Despite multiple (now 6) attempts, NG tube placement has been unsuccessful.  She is feeling mildly better, though, and has not vomited since she was at [facility] (transferred ER:ER).  She previously had a vaginal partial hysterectomy but has not otherwise had abdominal surgery.   ED Course: Sent [facility] to [facility].  SBO with concern for vascular lymphatic compromise, surgery will see but requests [facility] admission."  NG tube placement proved very challenging, with 6 unsuccessful  attempts.  Ultimately, GI was able to place NG tube via endoscopy.  Patient's large hiatal hernia felt to be the complicating factor.      Subjective 12/14/20    Patient continues to have constant nausea she attributes to the NG tube.  Feels she won't get better until it comes out.  States abdominal pain not as bothersome as her throat and back upper tooth since the EGD placement of tube.    Disposition Plan & Communication   Status is: Inpatient  Remains inpatient appropriate because:IV treatments appropriate due to intensity of illness or inability to take PO  Dispo: The patient is from: Home              Anticipated d/c is to: Home              Patient currently is not medically stable to d/c.   Difficult to place patient No  Family Communication: Daughter at bedside on rounds 6/15, 6/16, 6/17, 6/18   Consults, Procedures, Significant Events   Consultants:  General surgery Gastroenterology  Procedures:  NG tube placement under endoscopic guidance by GI  Antimicrobials:  Anti-infectives (From admission, onward)    None         Micro    Objective   Vitals:   12/13/20 2346 12/14/20 0544 12/14/20 1154 12/14/20 1322  BP: 131/75 (!) 150/79 (!) 172/85 138/75  Pulse: 69 73 68 65  Resp: 14 17 20 18   Temp: 98.2 F (36.8 C) 98.7 F (37.1 C) 98.5 F (36.9 C)   TempSrc: Oral Oral Oral   SpO2: 94% 96% 97% 98%  Weight:      Height:        Intake/Output Summary (Last 24 hours) at 12/14/2020 1716 Last data filed at 12/14/2020 1451 Gross per 24 hour  Intake 120 ml  Output 1601 ml  Net -1481 ml   Filed Weights   12/10/20 0259  Weight: 63.5 kg    Physical Exam:  General exam: awake, alert, no acute distress but appears miserable HEENT: NG tube in place with bilious fluid in tubing and canister, hearing grossly normal, no visualized oral mucosal abrasion or erythema at left back upper molar Respiratory system: normal respiratory effort, on room  air. Cardiovascular system: RRR, nno pedal edema.   Gastrointestinal system: soft, non-distended, less active bowel sounds than prior days Central nervous system: A&O x3. Grossly non-focal exam, normal speech  Labs   Data Reviewed: I have personally reviewed following labs and imaging studies  CBC: Recent Labs  Lab 12/10/20 0304 12/11/20 0631 12/12/20 0515 12/14/20 0619  WBC 16.3* 12.6* 12.7* 9.8  NEUTROABS 13.6*  --   --   --   HGB 14.7 12.4 13.3 13.0  HCT 42.6 38.7 39.8 39.8  MCV 92.6 99.5 95.7 97.3  PLT 248 170 200 403   Basic Metabolic Panel: Recent Labs  Lab 12/10/20 0304 12/11/20 0631 12/12/20 0515 12/14/20 0619  NA 141 137 137 137  K 3.6 3.9 3.5 4.0  CL 104 105 99 107  CO2 23 25 26  19*  GLUCOSE 145* 101* 80 79  BUN 14 15 15 9   CREATININE 0.73 0.63 0.74 0.75  CALCIUM 10.3 8.4* 8.8* 8.4*  MG  --   --  1.9 1.9   GFR: Estimated Creatinine Clearance: 46.9 mL/min (by C-G formula based on SCr of 0.75 mg/dL). Liver Function Tests: Recent Labs  Lab 12/10/20 0304  AST 24  ALT 16  ALKPHOS 64  BILITOT 0.6  PROT 7.9  ALBUMIN 4.9   Recent Labs  Lab 12/10/20 0304  LIPASE 19   No results for input(s): AMMONIA in the last 168 hours. Coagulation Profile: No results for input(s): INR, PROTIME in the last 168 hours. Cardiac Enzymes: No results for input(s): CKTOTAL, CKMB, CKMBINDEX, TROPONINI in the last 168 hours. BNP (last 3 results) No results for input(s): PROBNP in the last 8760 hours. HbA1C: No results for input(s): HGBA1C in the last 72 hours. CBG: No results for input(s): GLUCAP in the last 168 hours. Lipid Profile: No results for input(s): CHOL, HDL, LDLCALC, TRIG, CHOLHDL, LDLDIRECT in the last 72 hours. Thyroid Function Tests: No results for input(s): TSH, T4TOTAL, FREET4, T3FREE, THYROIDAB in the last 72 hours. Anemia Panel: No results for input(s): VITAMINB12, FOLATE, FERRITIN, TIBC, IRON, RETICCTPCT in the last 72 hours. Sepsis Labs: Recent  Labs  Lab 12/10/20 0604  LATICACIDVEN 0.9    Recent Results (from the past 240 hour(s))  Resp Panel by RT-PCR (Flu A&B, Covid) Nasopharyngeal Swab     Status: None   Collection Time: 12/10/20  6:04 AM   Specimen: Nasopharyngeal Swab; Nasopharyngeal(NP) swabs in vial transport medium  Result Value Ref Range Status   SARS Coronavirus 2 by RT PCR NEGATIVE NEGATIVE Final    Comment: (NOTE) SARS-CoV-2 target nucleic acids are NOT DETECTED.  The SARS-CoV-2 RNA is generally detectable in upper respiratory specimens during the acute phase of infection. The lowest concentration of SARS-CoV-2 viral copies this assay can detect is 138 copies/mL. A negative result does not preclude SARS-Cov-2 infection and should not be used as the sole basis for treatment or other patient management decisions. A negative result may occur with  improper specimen collection/handling, submission of specimen other than nasopharyngeal swab, presence of viral mutation(s) within the areas targeted by this assay, and inadequate number of viral copies(<138 copies/mL). A negative result must be combined with clinical observations, patient history, and epidemiological information. The expected result is Negative.  Fact Sheet for Patients:  EntrepreneurPulse.com.au  Fact Sheet for Healthcare Providers:  IncredibleEmployment.be  This test is no t yet approved or cleared by the [place] FDA and  has been authorized for detection and/or diagnosis of SARS-CoV-2 by FDA under an Emergency Use Authorization (EUA). This EUA will remain  in effect (meaning this test can be used) for the duration of the COVID-19 declaration under Section 564(b)(1) of the Act, 21 U.S.C.section 360bbb-3(b)(1), unless the authorization is terminated  or revoked sooner.       Influenza A by PCR NEGATIVE NEGATIVE Final   Influenza B by PCR NEGATIVE NEGATIVE Final    Comment: (NOTE) The Xpert Xpress  SARS-CoV-2/FLU/RSV plus assay is intended as an aid in the diagnosis of influenza from Nasopharyngeal swab specimens and should not be used as a sole basis for treatment. Nasal washings and aspirates are unacceptable for Xpert Xpress SARS-CoV-2/FLU/RSV testing.  Fact Sheet for Patients: EntrepreneurPulse.com.au  Fact Sheet for Healthcare Providers: IncredibleEmployment.be  This test is not yet approved or  cleared by the [place] and has been authorized for detection and/or diagnosis of SARS-CoV-2 by FDA under an Emergency Use Authorization (EUA). This EUA will remain in effect (meaning this test can be used) for the duration of the COVID-19 declaration under Section 564(b)(1) of the Act, 21 U.S.C. section 360bbb-3(b)(1), unless the authorization is terminated or revoked.  Performed at [facility], [address], [place], [place] [address]       Imaging Studies   DG Abd Portable 1V  Result Date: 12/14/2020 CLINICAL DATA:  Abdominal pain and distension. EXAM: PORTABLE ABDOMEN - 1 VIEW COMPARISON:  None. FINDINGS: Nasogastric tube remains in place with tip overlying the gastric antrum or proximal duodenum. No evidence of dilated bowel loops. Severe thoracolumbar levoscoliosis and degenerative changes again noted. IMPRESSION: Nonobstructive bowel gas pattern. Nasogastric tube remains in place. Electronically Signed   By: Marlaine Hind M.D.   On: 12/14/2020 09:50   DG Abd Portable 1V-Small Bowel Obstruction Protocol-24 hr delay  Result Date: 12/12/2020 CLINICAL DATA:  86 year old female with abdominal pain, nausea vomiting. EXAM: PORTABLE ABDOMEN - 1 VIEW COMPARISON:  Abdominal radiograph dated 12/11/2020. FINDINGS: Enteric tube with tip in the right lower abdomen in similar position. There is no bowel dilatation or evidence of obstruction. No free air or radiopaque calculi. There is osteopenia with severe scoliosis and  degenerative changes of the spine. No acute osseous pathology. IMPRESSION: Similar positioning of the enteric tube. No evidence of bowel obstruction. Electronically Signed   By: Anner Crete M.D.   On: 12/12/2020 21:19     Medications   Scheduled Meds:  enoxaparin (LOVENOX) injection  40 mg Subcutaneous Q24H   Immune Globulin (Human)  10 g Subcutaneous Q Wed   levothyroxine  50 mcg Intravenous Daily   olopatadine  1 drop Both Eyes BID   pantoprazole (PROTONIX) IV  40 mg Intravenous Q24H   Continuous Infusions:  lactated ringers     lactated ringers 75 mL/hr at 12/14/20 0809   methocarbamol (ROBAXIN) IV     promethazine (PHENERGAN) injection (IM or IVPB)         LOS: 4 days    Time spent: 35 minutes with > 50% spent at bedside and in coordination of care.    Ezekiel Slocumb, DO [facility]  12/14/2020, 5:16 PM      If 7PM-7AM, please contact night-coverage. How to contact the [facility] Attending or Consulting provider [place] or covering provider during after hours [place], for this patient?    Check the care team in [facility] and look for a) attending/consulting [facility] provider listed and b) the [facility] team listed Log into [email or web address] and use 's universal password to access. If you do not have the password, please contact the hospital operator. Locate the [facility] provider you are looking for under [facility] and page to a number that you can be directly reached. If you still have difficulty reaching the provider, please page the [facility] (Director on Call) for the Hospitalists listed on amion for assistance.

## 2020-12-15 MED ORDER — ACETAMINOPHEN 160 MG/5ML PO SOLN
500.0000 mg | Freq: Four times a day (QID) | ORAL | Status: DC | PRN
  Filled 2020-12-15: qty 20.3

## 2020-12-15 MED ORDER — LEVOTHYROXINE SODIUM 100 MCG PO TABS
100.0000 ug | ORAL_TABLET | Freq: Every day | ORAL | Status: DC
  Administered 2020-12-15 – 2020-12-19 (×4): 100 ug via ORAL
  Filled 2020-12-15 (×5): qty 1

## 2020-12-15 MED ORDER — PANTOPRAZOLE SODIUM 40 MG PO TBEC
40.0000 mg | DELAYED_RELEASE_TABLET | Freq: Every day | ORAL | Status: DC
  Administered 2020-12-16 – 2020-12-19 (×4): 40 mg via ORAL
  Filled 2020-12-15 (×4): qty 1

## 2020-12-15 MED ORDER — GABAPENTIN 300 MG PO CAPS
300.0000 mg | ORAL_CAPSULE | Freq: Four times a day (QID) | ORAL | Status: DC
  Administered 2020-12-15 – 2020-12-19 (×12): 300 mg via ORAL
  Filled 2020-12-15 (×12): qty 1

## 2020-12-15 MED ORDER — ACETAMINOPHEN 10 MG/ML IV SOLN
1000.0000 mg | Freq: Four times a day (QID) | INTRAVENOUS | Status: AC | PRN
  Administered 2020-12-15 (×2): 1000 mg via INTRAVENOUS
  Filled 2020-12-15 (×3): qty 100

## 2020-12-15 NOTE — Progress Notes (Addendum)
PROGRESS NOTE    [name]   MHD:[id number]  DOB: [birth year]  PCP: Janith Lima, MD    DOA: 12/10/2020 LOS: 5   Assessment & Plan   Principal Problem:   SBO (small bowel obstruction) ([place]) Active Problems:   Hypothyroidism   Essential hypertension   Pacemaker   Grade 3 follicular lymphoma of lymph nodes of neck (HCC)   PAD (peripheral artery disease) (HCC)   SBO - in setting of distant partial hysterectomy (1980's).  CT abdomen/pelvis showed SBO with possible transition point in the distal ileum.   6/17 - trial of NGT clamped unsuccessful, returned to suction 6/18 - +flatus, improving NG output, NG removed PM 619 - +BM this AM, tolerates some full liquids, +nausea --General surgery managing --SBO protocol --NG tube removed afternoon 6/18 --Full liquids --Resume PO meds where IV substitutes used --IV antiemetics - scheduled Phenergan, PRN Zofran --Pain control PRN - IV Tylenol --Ambulate --IV fluids - cont D5-LR until we know adequate PO intake  Oral Pain / Throat pain - oral pain reports at back upper molar since scope for NGT placement.  Unable to see any trauma on my exam.   Throat pain 2/2 NG tube. --Chloraseptic spray, viscous lidocaine  Palpitations - overnight 6/16-17.  PM in place.  HR's okay. --Telemetry to monitor --[employer] rep to come interrogate PM today   Large hiatal hernia - likely caused difficulty with NG placement.  Monitor.  CT neck cancelled.  CVID - daughter brought patient's Hizentra which she gets 10 g weekly.  Hx of recurrent sinopulmonary infections, occasional GI symptoms which are quite different than current presentation. --Hizentra given 6/15  Non-hodgin's Lymphoma / Follicular - follow with Dr. Julien Nordmann, last visit Dec 2021.  No acute issues.  Monitor.    PAD - Follows with Dr. Stanford Breed, last seen in Dec 2021, was to start ASA and statin.  These not on her med history(?) --Outpatient follow up  Pacemaker in place -on  admission, patient inquired about interrogation of device.  Daughter to bring in [facility] card with contact info.  A. Fib -rate controlled, as needed IV Lopressor for now while n.p.o.  Appears not on anticoagulation.  History of hypertension -continue regimen appears lisinopril and Lopressor, if tolerating oral meds.  As needed IV Lopressor..  Hypothyroidism -resume Synthroid at current dose when resuming p.o.    Patient BMI: Body mass index is 24.8 kg/m.    DVT prophylaxis: enoxaparin (LOVENOX) injection 40 mg Start: 12/11/20 2000 SCDs Start: 12/10/20 1059   Diet:  Diet Orders (From admission, onward)     Start     Ordered   12/15/20 0808  Diet full liquid Room service appropriate? Yes; Fluid consistency: Thin  Diet effective now       Question Answer Comment  Room service appropriate? Yes   Fluid consistency: Thin      12/15/20 0810              Code Status: Full Code   Brief Narrative / Hospital Course to Date:   Per H&P by Dr. Lorin Mercy: "[name] is a 86 y.o. female with medical history significant of afib;  HTN; hypothyroidism; pacemaker placement; PAD; CVID; and NHL in remission presenting with abdominal pain.  She had a normal day yesterday and ate a small but regular dinner last night.  She had crampy abdominal pain starting about 2030 and had a normal BM about 2100.  The pain continued to escalate and she developed  n/v.  Despite multiple (now 6) attempts, NG tube placement has been unsuccessful.  She is feeling mildly better, though, and has not vomited since she was at [facility] (transferred ER:ER).  She previously had a vaginal partial hysterectomy but has not otherwise had abdominal surgery.   ED Course: Sent [facility] to [facility].  SBO with concern for vascular lymphatic compromise, surgery will see but requests [facility] admission."  NG tube placement proved very challenging, with 6 unsuccessful attempts.  Ultimately, GI was able to place NG tube via endoscopy.   Patient's large hiatal hernia felt to be the complicating factor.      Subjective 12/15/20    Patient had NG tube removed yesterday.  She had a BM this AM.  Reports she has ongoing nausea however.  Tooth and throat pain are better today.  PM rep hasn't been in yet.  Diet advanced to full liquids, tolerated a little bit.  Hopes to go home soon.  Disposition Plan & Communication   Status is: Inpatient  Remains inpatient appropriate because:IV treatments appropriate due to intensity of illness or inability to take PO  Dispo: The patient is from: Home              Anticipated d/c is to: Home              Patient currently is not medically stable to d/c.   Difficult to place patient No  Family Communication: Daughter at bedside on rounds 6/15, 6/16, 6/17, 6/18, 6/19   Consults, Procedures, Significant Events   Consultants:  General surgery Gastroenterology  Procedures:  NG tube placement under endoscopic guidance by GI  Antimicrobials:  Anti-infectives (From admission, onward)    None         Micro    Objective   Vitals:   12/14/20 1845 12/14/20 2347 12/15/20 0521 12/15/20 1230  BP: 133/68 (!) 145/76 131/75 132/73  Pulse: 70 72 72 69  Resp: 18 17 18 16   Temp: 99 F (37.2 C) 98.3 F (36.8 C) 97.9 F (36.6 C) 98.2 F (36.8 C)  TempSrc: Oral Oral Oral Oral  SpO2: 90% 92% 92% 96%  Weight:      Height:        Intake/Output Summary (Last 24 hours) at 12/15/2020 1351 Last data filed at 12/14/2020 2200 Gross per 24 hour  Intake 360 ml  Output 950 ml  Net -590 ml   Filed Weights   12/10/20 0259  Weight: 63.5 kg    Physical Exam:  General exam: awake, alert, no acute distress  HEENT: NG tube out, hearing grossly normal Respiratory system: normal respiratory effort, on room air. Cardiovascular system: RRR, nno pedal edema.   Gastrointestinal system: soft, non-distended Central nervous system: A&O x3. Grossly non-focal exam, normal speech Extremities: no  edema, normal tone  Labs   Data Reviewed: I have personally reviewed following labs and imaging studies  CBC: Recent Labs  Lab 12/10/20 0304 12/11/20 0631 12/12/20 0515 12/14/20 0619  WBC 16.3* 12.6* 12.7* 9.8  NEUTROABS 13.6*  --   --   --   HGB 14.7 12.4 13.3 13.0  HCT 42.6 38.7 39.8 39.8  MCV 92.6 99.5 95.7 97.3  PLT 248 170 200 229   Basic Metabolic Panel: Recent Labs  Lab 12/10/20 0304 12/11/20 0631 12/12/20 0515 12/14/20 0619  NA 141 137 137 137  K 3.6 3.9 3.5 4.0  CL 104 105 99 107  CO2 23 25 26  19*  GLUCOSE 145* 101* 80 79  BUN 14 15 15 9   CREATININE 0.73 0.63 0.74 0.75  CALCIUM 10.3 8.4* 8.8* 8.4*  MG  --   --  1.9 1.9   GFR: Estimated Creatinine Clearance: 46.9 mL/min (by C-G formula based on SCr of 0.75 mg/dL). Liver Function Tests: Recent Labs  Lab 12/10/20 0304  AST 24  ALT 16  ALKPHOS 64  BILITOT 0.6  PROT 7.9  ALBUMIN 4.9   Recent Labs  Lab 12/10/20 0304  LIPASE 19   No results for input(s): AMMONIA in the last 168 hours. Coagulation Profile: No results for input(s): INR, PROTIME in the last 168 hours. Cardiac Enzymes: No results for input(s): CKTOTAL, CKMB, CKMBINDEX, TROPONINI in the last 168 hours. BNP (last 3 results) No results for input(s): PROBNP in the last 8760 hours. HbA1C: No results for input(s): HGBA1C in the last 72 hours. CBG: No results for input(s): GLUCAP in the last 168 hours. Lipid Profile: No results for input(s): CHOL, HDL, LDLCALC, TRIG, CHOLHDL, LDLDIRECT in the last 72 hours. Thyroid Function Tests: No results for input(s): TSH, T4TOTAL, FREET4, T3FREE, THYROIDAB in the last 72 hours. Anemia Panel: No results for input(s): VITAMINB12, FOLATE, FERRITIN, TIBC, IRON, RETICCTPCT in the last 72 hours. Sepsis Labs: Recent Labs  Lab 12/10/20 0604  LATICACIDVEN 0.9    Recent Results (from the past 240 hour(s))  Resp Panel by RT-PCR (Flu A&B, Covid) Nasopharyngeal Swab     Status: None   Collection Time:  12/10/20  6:04 AM   Specimen: Nasopharyngeal Swab; Nasopharyngeal(NP) swabs in vial transport medium  Result Value Ref Range Status   SARS Coronavirus 2 by RT PCR NEGATIVE NEGATIVE Final    Comment: (NOTE) SARS-CoV-2 target nucleic acids are NOT DETECTED.  The SARS-CoV-2 RNA is generally detectable in upper respiratory specimens during the acute phase of infection. The lowest concentration of SARS-CoV-2 viral copies this assay can detect is 138 copies/mL. A negative result does not preclude SARS-Cov-2 infection and should not be used as the sole basis for treatment or other patient management decisions. A negative result may occur with  improper specimen collection/handling, submission of specimen other than nasopharyngeal swab, presence of viral mutation(s) within the areas targeted by this assay, and inadequate number of viral copies(<138 copies/mL). A negative result must be combined with clinical observations, patient history, and epidemiological information. The expected result is Negative.  Fact Sheet for Patients:  EntrepreneurPulse.com.au  Fact Sheet for Healthcare Providers:  IncredibleEmployment.be  This test is no t yet approved or cleared by the [place] FDA and  has been authorized for detection and/or diagnosis of SARS-CoV-2 by FDA under an Emergency Use Authorization (EUA). This EUA will remain  in effect (meaning this test can be used) for the duration of the COVID-19 declaration under Section 564(b)(1) of the Act, 21 U.S.C.section 360bbb-3(b)(1), unless the authorization is terminated  or revoked sooner.       Influenza A by PCR NEGATIVE NEGATIVE Final   Influenza B by PCR NEGATIVE NEGATIVE Final    Comment: (NOTE) The Xpert Xpress SARS-CoV-2/FLU/RSV plus assay is intended as an aid in the diagnosis of influenza from Nasopharyngeal swab specimens and should not be used as a sole basis for treatment. Nasal washings  and aspirates are unacceptable for Xpert Xpress SARS-CoV-2/FLU/RSV testing.  Fact Sheet for Patients: EntrepreneurPulse.com.au  Fact Sheet for Healthcare Providers: IncredibleEmployment.be  This test is not yet approved or cleared by the [place] FDA and has been authorized for detection and/or diagnosis of SARS-CoV-2 by FDA  under an Emergency Use Authorization (EUA). This EUA will remain in effect (meaning this test can be used) for the duration of the COVID-19 declaration under Section 564(b)(1) of the Act, 21 U.S.C. section 360bbb-3(b)(1), unless the authorization is terminated or revoked.  Performed at [facility], [address] [place], [place] [address]       Imaging Studies   DG Abd Portable 1V  Result Date: 12/14/2020 CLINICAL DATA:  Abdominal pain and distension. EXAM: PORTABLE ABDOMEN - 1 VIEW COMPARISON:  None. FINDINGS: Nasogastric tube remains in place with tip overlying the gastric antrum or proximal duodenum. No evidence of dilated bowel loops. Severe thoracolumbar levoscoliosis and degenerative changes again noted. IMPRESSION: Nonobstructive bowel gas pattern. Nasogastric tube remains in place. Electronically Signed   By: Marlaine Hind M.D.   On: 12/14/2020 09:50     Medications   Scheduled Meds:  enoxaparin (LOVENOX) injection  40 mg Subcutaneous Q24H   gabapentin  300 mg Oral QID   Immune Globulin (Human)  10 g Subcutaneous Q Wed   levothyroxine  100 mcg Oral Q0600   olopatadine  1 drop Both Eyes BID   pantoprazole  40 mg Oral Daily   Continuous Infusions:  acetaminophen 1,000 mg (12/15/20 0252)   dextrose 5% lactated ringers 75 mL/hr at 12/15/20 1304   lactated ringers     methocarbamol (ROBAXIN) IV     promethazine (PHENERGAN) injection (IM or IVPB) 12.5 mg (12/15/20 1306)       LOS: 5 days    Time spent: 25 minutes with > 50% spent at bedside and in coordination of  care.    Ezekiel Slocumb, DO [facility]  12/15/2020, 1:51 PM      If 7PM-7AM, please contact night-coverage. How to contact the [facility] Attending or Consulting provider [place] or covering provider during after hours [place], for this patient?    Check the care team in [facility] and look for a) attending/consulting [facility] provider listed and b) the [facility] team listed Log into [email or web address] and use [facility]'s universal password to access. If you do not have the password, please contact the hospital operator. Locate the [facility] provider you are looking for under [facility] and page to a number that you can be directly reached. If you still have difficulty reaching the provider, please page the [facility] (Director on Call) for the Hospitalists listed on amion for assistance.

## 2020-12-15 NOTE — Progress Notes (Signed)
5 Days Post-Op  Subjective: CC: NGT clamped and removed in PM yesterday. She reports continued nausea without emesis. She is tolerating cld (apple juice, chicken broth, jello) without increased nausea or associated abdominal pain. She continues to pass flatus and had a normal sized, formed bm this am. She is mobilizing.   Objective: Vital signs in last 24 hours: Temp:  [97.9 F (36.6 C)-99 F (37.2 C)] 97.9 F (36.6 C) (06/19 0521) Pulse Rate:  [65-72] 72 (06/19 0521) Resp:  [17-20] 18 (06/19 0521) BP: (131-172)/(68-85) 131/75 (06/19 0521) SpO2:  [90 %-98 %] 92 % (06/19 0521) Last BM Date: 12/10/20  Intake/Output from previous day: 06/18 0701 - 06/19 0700 In: 360 [P.O.:360] Out: 950 [Urine:800; Emesis/NG output:150] Intake/Output this shift: No intake/output data recorded.  PE: Gen:  Alert, NAD, pleasant Abd: Soft with improved mild lower abdominal distension. Some mild suprapubic and luq tenderness without peritonitis. Otherwise NT. More active BS.   Psych: A&Ox3 Skin: no rashes noted, warm and dry  Lab Results:  Recent Labs    12/14/20 0619  WBC 9.8  HGB 13.0  HCT 39.8  PLT 225   BMET Recent Labs    12/14/20 0619  NA 137  K 4.0  CL 107  CO2 19*  GLUCOSE 79  BUN 9  CREATININE 0.75  CALCIUM 8.4*   PT/INR No results for input(s): LABPROT, INR in the last 72 hours. CMP     Component Value Date/Time   NA 137 12/14/2020 0619   NA 143 11/26/2017 1159   NA 143 06/16/2017 0921   K 4.0 12/14/2020 0619   K 3.8 06/16/2017 0921   CL 107 12/14/2020 0619   CL 109 (H) 12/14/2012 1009   CO2 19 (L) 12/14/2020 0619   CO2 28 06/16/2017 0921   GLUCOSE 79 12/14/2020 0619   GLUCOSE 69 (L) 06/16/2017 0921   GLUCOSE 64 (L) 12/14/2012 1009   BUN 9 12/14/2020 0619   BUN 18 11/26/2017 1159   BUN 11.7 06/16/2017 0921   CREATININE 0.75 12/14/2020 0619   CREATININE 0.80 06/14/2020 1335   CREATININE 0.8 06/16/2017 0921   CALCIUM 8.4 (L) 12/14/2020 0619   CALCIUM  9.7 06/16/2017 0921   PROT 7.9 12/10/2020 0304   PROT 7.5 06/16/2017 0921   ALBUMIN 4.9 12/10/2020 0304   ALBUMIN 3.9 06/16/2017 0921   AST 24 12/10/2020 0304   AST 28 06/14/2020 1335   AST 23 06/16/2017 0921   ALT 16 12/10/2020 0304   ALT 19 06/14/2020 1335   ALT 18 06/16/2017 0921   ALKPHOS 64 12/10/2020 0304   ALKPHOS 63 06/16/2017 0921   BILITOT 0.6 12/10/2020 0304   BILITOT 0.6 06/14/2020 1335   BILITOT 0.52 06/16/2017 0921   GFRNONAA >60 12/14/2020 0619   GFRNONAA >60 06/14/2020 1335   GFRNONAA >60 10/16/2010 1013   GFRAA >60 06/16/2019 1030   GFRAA >60 10/16/2010 1013   Lipase     Component Value Date/Time   LIPASE 19 12/10/2020 0304       Studies/Results: DG Abd Portable 1V  Result Date: 12/14/2020 CLINICAL DATA:  Abdominal pain and distension. EXAM: PORTABLE ABDOMEN - 1 VIEW COMPARISON:  None. FINDINGS: Nasogastric tube remains in place with tip overlying the gastric antrum or proximal duodenum. No evidence of dilated bowel loops. Severe thoracolumbar levoscoliosis and degenerative changes again noted. IMPRESSION: Nonobstructive bowel gas pattern. Nasogastric tube remains in place. Electronically Signed   By: Marlaine Hind M.D.   On: 12/14/2020 09:50  Anti-infectives: Anti-infectives (From admission, onward)    None        Assessment/Plan SBO - Xray 6/18 w/ non-obstructive bowel gas pattern. Tolerated NGT clamping 6/18. NGT out and on cld. +Flatus and BM this AM but still some nausea. Will try FLD today. Okay for oral meds.  - Mobilize for bowel function - Keep K > 4 and Mg > 2 for bowel function   Mouth/tooth pain -viscoous lidocaine PRN   FEN: FLD ID: none VTE: SCDs, lovenox   Per primary team: Hiatal Hernia HTN Hypothyroidism A.fib Permanent pacemaker placement 2013 PAD NHL in remission   LOS: 5 days    Jillyn Ledger , [facility] Surgery 12/15/2020, 8:02 AM Please see Amion for pager number during day hours  7:00am-4:30pm

## 2020-12-16 ENCOUNTER — Inpatient Hospital Stay (HOSPITAL_COMMUNITY): Payer: Medicare Other

## 2020-12-16 ENCOUNTER — Encounter (HOSPITAL_COMMUNITY): Payer: Self-pay | Admitting: Internal Medicine

## 2020-12-16 DIAGNOSIS — D839 Common variable immunodeficiency, unspecified: Secondary | ICD-10-CM | POA: Diagnosis not present

## 2020-12-16 LAB — CBC
HCT: 39.9 % (ref 36.0–46.0)
Hemoglobin: 13.5 g/dL (ref 12.0–15.0)
MCH: 31.4 pg (ref 26.0–34.0)
MCHC: 33.8 g/dL (ref 30.0–36.0)
MCV: 92.8 fL (ref 80.0–100.0)
Platelets: 250 10*3/uL (ref 150–400)
RBC: 4.3 MIL/uL (ref 3.87–5.11)
RDW: 13.8 % (ref 11.5–15.5)
WBC: 7.8 10*3/uL (ref 4.0–10.5)
nRBC: 0 % (ref 0.0–0.2)

## 2020-12-16 LAB — BASIC METABOLIC PANEL
Anion gap: 5 (ref 5–15)
BUN: 9 mg/dL (ref 8–23)
CO2: 27 mmol/L (ref 22–32)
Calcium: 8.3 mg/dL — ABNORMAL LOW (ref 8.9–10.3)
Chloride: 108 mmol/L (ref 98–111)
Creatinine, Ser: 0.48 mg/dL (ref 0.44–1.00)
GFR, Estimated: >60 mL/min (ref >60)
Glucose, Bld: 119 mg/dL — ABNORMAL HIGH (ref 70–99)
Potassium: 3.1 mmol/L — ABNORMAL LOW (ref 3.5–5.1)
Sodium: 140 mmol/L (ref 135–145)

## 2020-12-16 LAB — MAGNESIUM: Magnesium: 1.6 mg/dL — ABNORMAL LOW (ref 1.7–2.4)

## 2020-12-16 MED ORDER — GUAIFENESIN-DM 100-10 MG/5ML PO SYRP
10.0000 mL | ORAL_SOLUTION | ORAL | Status: DC | PRN
  Administered 2020-12-16 – 2020-12-18 (×2): 10 mL via ORAL
  Filled 2020-12-16 (×4): qty 10

## 2020-12-16 MED ORDER — SODIUM CHLORIDE 0.9 % IV SOLN
12.5000 mg | Freq: Four times a day (QID) | INTRAVENOUS | Status: DC | PRN
  Administered 2020-12-16: 12.5 mg via INTRAVENOUS
  Filled 2020-12-16: qty 0.5

## 2020-12-16 MED ORDER — DEXTROSE IN LACTATED RINGERS 5 % IV SOLN: INTRAVENOUS | Status: DC

## 2020-12-16 MED ORDER — PROMETHAZINE HCL 25 MG PO TABS
12.5000 mg | ORAL_TABLET | Freq: Four times a day (QID) | ORAL | Status: DC | PRN
  Administered 2020-12-16 (×2): 12.5 mg via ORAL
  Filled 2020-12-16 (×2): qty 1

## 2020-12-16 NOTE — Progress Notes (Signed)
PROGRESS NOTE    [name]   TIR:[id number]  DOB: 16-Jul-1934  PCP: Janith Lima, MD    DOA: 12/10/2020 LOS: 6   Assessment & Plan   Principal Problem:   SBO (small bowel obstruction) ([place]) Active Problems:   Hypothyroidism   Essential hypertension   Pacemaker   Grade 3 follicular lymphoma of lymph nodes of neck (HCC)   PAD (peripheral artery disease) (HCC)   SBO - in setting of distant partial hysterectomy (1980's).  CT abdomen/pelvis showed SBO with possible transition point in the distal ileum.   6/17 - trial of NGT clamped unsuccessful, returned to suction 6/18 - +flatus, improving NG output, NG removed PM 619 - +BM this AM, tolerates some full liquids, +nausea 6/20 - ongoing pain and nausea; Xray c/w likely recurring bowel obstruction --General surgery managing --SBO protocol --NG tube removed afternoon 6/18 --Full liquids --Resume PO meds where IV substitutes used --IV or PO antiemetics - Phenergan more helpful vs Zofran, both ordered --Pain control PRN - IV Tylenol --Ambulate for bowel function --IV fluids - cont D5-LR until tolerating adequate PO   Cough - reported 6/20. No fevers or chills, but with CVID is prone to respiratory infections.  Chest xray showed only bibasilar atelectasis and large hiatal hernia.  --Hold off antibiotic until stool sample collected, unless fevers  Oral Pain / Throat pain - Resolved - oral pain at back upper molar after endoscope for NGT placement.  Did not see any trauma on my exam.    Throat pain 2/2 NG tube - Improved w/tube out. --Chloraseptic spray, viscous lidocaine  Palpitations - overnight 6/16-17.  PM in place.  HR's okay. --Telemetry to monitor --[employer] rep to come interrogate PM   Large hiatal hernia - likely caused difficulty with NG placement.  Monitor.   CVID (common variable immunodeficiency -  Pt follows at [facility] with Dr. Achilles Dunk (pediatric immunologist).  I spoke to their on-call fellow 6/20 -  advised most important to continue Hizentra and rule out any infection.  Otherwise usual SBO evaluation and management.  They would be happy to take care of her at [facility] if transfer requested, but unlikely they would manage differently --Continue Hizentra subcutaneous infusion 10 g weekly (on Wednesday) (pt's med brought from home).   Hx of recurrent sinopulmonary infections, occasional GI symptoms which are quite different than current presentation. --Hizentra given 6/15, next dose on 6/22 --Stool culture and GI panel pending - follow up (No C diff test given extremely low clinical suspicion)  Non-hodgin's Lymphoma / Follicular - follow with Dr. Julien Nordmann, last visit Dec 2021.  No acute issues.  Monitor.    PAD - Follows with Dr. Stanford Breed, last seen in Dec 2021, was to start ASA and statin.  These not on her med history(?) --Outpatient follow up  Pacemaker in place -on admission, patient inquired about interrogation of device.  Cardiology contacted [employer] rep on 6/18 and rep to come in within next day or two for interrogation.  A. Fib - rate controlled, as needed IV Lopressor for now while n.p.o.  Appears not on anticoagulation.  History of hypertension - regimen appears lisinopril and Lopressor - on hold and BP's controlled. As needed IV Lopressor for now.  Resume home meds when indicated and if tolerating PO meds..  Hypothyroidism - continue Synthroid     Patient BMI: Body mass index is 24.8 kg/m.    DVT prophylaxis: enoxaparin (LOVENOX) injection 40 mg Start: 12/11/20 2000 SCDs Start: 12/10/20 1059  Diet:  Diet Orders (From admission, onward)     Start     Ordered   12/17/20 0001  Diet NPO time specified  Diet effective midnight        12/16/20 1458   12/15/20 0808  Diet full liquid Room service appropriate? Yes; Fluid consistency: Thin  Diet effective now       Question Answer Comment  Room service appropriate? Yes   Fluid consistency: Thin      12/15/20 0810               Code Status: Full Code   Brief Narrative / Hospital Course to Date:   Per H&P by Dr. Lorin Mercy: "HAZLEY DEZEEUW is a 86 y.o. female with medical history significant of afib;  HTN; hypothyroidism; pacemaker placement; PAD; CVID; and NHL in remission presenting with abdominal pain.  She had a normal day yesterday and ate a small but regular dinner last night.  She had crampy abdominal pain starting about 2030 and had a normal BM about 2100.  The pain continued to escalate and she developed n/v.  Despite multiple (now 6) attempts, NG tube placement has been unsuccessful.  She is feeling mildly better, though, and has not vomited since she was at [facility] (transferred ER:ER).  She previously had a vaginal partial hysterectomy but has not otherwise had abdominal surgery.   ED Course: Sent [facility] to [facility]  SBO with concern for vascular lymphatic compromise, surgery will see but requests [facility] admission."  NG tube placement proved very challenging, with 6 unsuccessful attempts.  Ultimately, GI was able to place NG tube via endoscopy.  Patient's large hiatal hernia felt to be the complicating factor.      Subjective 12/16/20    Patient still feeling terrible with ongoing abdominal pain and intermittent nausea.  Tolerated only little bit of full liquids.  Pt and daughter frustrated she's not feeling better yet.   Tolerating PO meds okay so far.  Disposition Plan & Communication   Status is: Inpatient  Remains inpatient appropriate because:IV treatments appropriate due to intensity of illness or inability to take PO  Dispo: The patient is from: Home              Anticipated d/c is to: Home              Patient currently is not medically stable to d/c.   Difficult to place patient No  Family Communication: Daughter at bedside on rounds daily   Consults, Procedures, Significant Events   Consultants:  General surgery Gastroenterology  Procedures:  NG tube placement under endoscopic guidance by  GI  Antimicrobials:  Anti-infectives (From admission, onward)    None         Micro    Objective   Vitals:   12/15/20 1835 12/15/20 2301 12/16/20 0524 12/16/20 1201  BP: 138/86 114/64 130/70 (!) 141/99  Pulse: 68 63 79 71  Resp: 16 18 16 16   Temp: 98.7 F (37.1 C) 98.8 F (37.1 C) 97.8 F (36.6 C) (!) 97.5 F (36.4 C)  TempSrc: Oral Oral Oral Oral  SpO2: 92% 93% 94% 91%  Weight:      Height:        Intake/Output Summary (Last 24 hours) at 12/16/2020 1637 Last data filed at 12/16/2020 1508 Gross per 24 hour  Intake 120.7 ml  Output --  Net 120.7 ml   Filed Weights   12/10/20 0259  Weight: 63.5 kg    Physical Exam:  General exam: appears feeling unwell, awake, alert, no acute distress  Respiratory system: normal respiratory effort, on room air. Cardiovascular system: CTAB, RRR, nno pedal edema.   Gastrointestinal system: soft, non-tender, non-distended Central nervous system: A&O x3. CN's grossly intact, normal speech  Labs   Data Reviewed: I have personally reviewed following labs and imaging studies  CBC: Recent Labs  Lab 12/10/20 0304 12/11/20 0631 12/12/20 0515 12/14/20 0619 12/16/20 0928  WBC 16.3* 12.6* 12.7* 9.8 7.8  NEUTROABS 13.6*  --   --   --   --   HGB 14.7 12.4 13.3 13.0 13.5  HCT 42.6 38.7 39.8 39.8 39.9  MCV 92.6 99.5 95.7 97.3 92.8  PLT 248 170 200 225 891   Basic Metabolic Panel: Recent Labs  Lab 12/10/20 0304 12/11/20 0631 12/12/20 0515 12/14/20 0619 12/16/20 0928  NA 141 137 137 137 140  K 3.6 3.9 3.5 4.0 3.1*  CL 104 105 99 107 108  CO2 23 25 26  19* 27  GLUCOSE 145* 101* 80 79 119*  BUN 14 15 15 9 9   CREATININE 0.73 0.63 0.74 0.75 0.48  CALCIUM 10.3 8.4* 8.8* 8.4* 8.3*  MG  --   --  1.9 1.9 1.6*   GFR: Estimated Creatinine Clearance: 46.9 mL/min (by C-G formula based on SCr of 0.48 mg/dL). Liver Function Tests: Recent Labs  Lab 12/10/20 0304  AST 24  ALT 16  ALKPHOS 64  BILITOT 0.6  PROT 7.9  ALBUMIN  4.9   Recent Labs  Lab 12/10/20 0304  LIPASE 19   No results for input(s): AMMONIA in the last 168 hours. Coagulation Profile: No results for input(s): INR, PROTIME in the last 168 hours. Cardiac Enzymes: No results for input(s): CKTOTAL, CKMB, CKMBINDEX, TROPONINI in the last 168 hours. BNP (last 3 results) No results for input(s): PROBNP in the last 8760 hours. HbA1C: No results for input(s): HGBA1C in the last 72 hours. CBG: No results for input(s): GLUCAP in the last 168 hours. Lipid Profile: No results for input(s): CHOL, HDL, LDLCALC, TRIG, CHOLHDL, LDLDIRECT in the last 72 hours. Thyroid Function Tests: No results for input(s): TSH, T4TOTAL, FREET4, T3FREE, THYROIDAB in the last 72 hours. Anemia Panel: No results for input(s): VITAMINB12, FOLATE, FERRITIN, TIBC, IRON, RETICCTPCT in the last 72 hours. Sepsis Labs: Recent Labs  Lab 12/10/20 0604  LATICACIDVEN 0.9    Recent Results (from the past 240 hour(s))  Resp Panel by RT-PCR (Flu A&B, Covid) Nasopharyngeal Swab     Status: None   Collection Time: 12/10/20  6:04 AM   Specimen: Nasopharyngeal Swab; Nasopharyngeal(NP) swabs in vial transport medium  Result Value Ref Range Status   SARS Coronavirus 2 by RT PCR NEGATIVE NEGATIVE Final    Comment: (NOTE) SARS-CoV-2 target nucleic acids are NOT DETECTED.  The SARS-CoV-2 RNA is generally detectable in upper respiratory specimens during the acute phase of infection. The lowest concentration of SARS-CoV-2 viral copies this assay can detect is 138 copies/mL. A negative result does not preclude SARS-Cov-2 infection and should not be used as the sole basis for treatment or other patient management decisions. A negative result may occur with  improper specimen collection/handling, submission of specimen other than nasopharyngeal swab, presence of viral mutation(s) within the areas targeted by this assay, and inadequate number of viral copies(<138 copies/mL). A negative  result must be combined with clinical observations, patient history, and epidemiological information. The expected result is Negative.  Fact Sheet for Patients:  EntrepreneurPulse.com.au  Fact Sheet for Healthcare  Providers:  IncredibleEmployment.be  This test is no t yet approved or cleared by the [place] and  has been authorized for detection and/or diagnosis of SARS-CoV-2 by FDA under an Emergency Use Authorization (EUA). This EUA will remain  in effect (meaning this test can be used) for the duration of the COVID-19 declaration under Section 564(b)(1) of the Act, 21 U.S.C.section 360bbb-3(b)(1), unless the authorization is terminated  or revoked sooner.       Influenza A by PCR NEGATIVE NEGATIVE Final   Influenza B by PCR NEGATIVE NEGATIVE Final    Comment: (NOTE) The Xpert Xpress SARS-CoV-2/FLU/RSV plus assay is intended as an aid in the diagnosis of influenza from Nasopharyngeal swab specimens and should not be used as a sole basis for treatment. Nasal washings and aspirates are unacceptable for Xpert Xpress SARS-CoV-2/FLU/RSV testing.  Fact Sheet for Patients: EntrepreneurPulse.com.au  Fact Sheet for Healthcare Providers: IncredibleEmployment.be  This test is not yet approved or cleared by the [place] FDA and has been authorized for detection and/or diagnosis of SARS-CoV-2 by FDA under an Emergency Use Authorization (EUA). This EUA will remain in effect (meaning this test can be used) for the duration of the COVID-19 declaration under Section 564(b)(1) of the Act, 21 U.S.C. section 360bbb-3(b)(1), unless the authorization is terminated or revoked.  Performed at [facility], [address] [place], [place] [address]       Imaging Studies   DG CHEST PORT 1 VIEW  Result Date: 12/16/2020 CLINICAL DATA:  Cough for 1 week. EXAM: PORTABLE CHEST 1 VIEW  COMPARISON:  04/21/2017 chest radiograph, 06/14/2020 chest CT and prior studies FINDINGS: The cardiomediastinal silhouette is unchanged with a large hiatal hernia again noted. Mild bibasilar atelectasis, LEFT greater than RIGHT, noted. A LEFT-sided pacemaker is unchanged. There is no evidence of focal airspace disease, pulmonary edema, suspicious pulmonary nodule/mass, pleural effusion, or pneumothorax. No acute bony abnormalities are identified. IMPRESSION: 1. Mild bibasilar atelectasis. 2. Large hiatal hernia. Electronically Signed   By: Margarette Canada M.D.   On: 12/16/2020 11:44   DG Abd Portable 1V  Result Date: 12/16/2020 CLINICAL DATA:  Abdominal pain.  Recent small bowel obstruction. EXAM: PORTABLE ABDOMEN - 1 VIEW COMPARISON:  12/14/2020 radiographs, 12/10/2020 CT and prior studies FINDINGS: NG tube is been removed. Increasing gaseous distension of small bowel is noted from 12/14/2020. Some gas is noted within the colon and rectum. No other significant changes are noted. Severe degenerative changes and scoliosis of the thoracolumbar spine noted. IMPRESSION: Increasing gaseous distension of small bowel from 12/14/2020 which may represent recurrent small-bowel obstruction. Electronically Signed   By: Margarette Canada M.D.   On: 12/16/2020 10:19     Medications   Scheduled Meds:  enoxaparin (LOVENOX) injection  40 mg Subcutaneous Q24H   gabapentin  300 mg Oral QID   Immune Globulin (Human)  10 g Subcutaneous Q Wed   levothyroxine  100 mcg Oral Q0600   olopatadine  1 drop Both Eyes BID   pantoprazole  40 mg Oral Daily   Continuous Infusions:  dextrose 5% lactated ringers 75 mL/hr at 12/16/20 1508   lactated ringers     methocarbamol (ROBAXIN) IV     promethazine (PHENERGAN) injection (IM or IVPB)         LOS: 6 days    Time spent: 40 minutes with >50% spent at bedside and in coordination of care.    Ezekiel Slocumb, DO [facility]  12/16/2020, 4:37 PM  If 7PM-7AM,  please contact night-coverage. How to contact the [facility] Attending or Consulting provider [place] or covering provider during after hours [place], for this patient?    Check the care team in [facility] and look for a) attending/consulting [facility] provider listed and b) the [facility] team listed Log into [email or web address] and use [place]'s universal password to access. If you do not have the password, please contact the hospital operator. Locate the [facility] provider you are looking for under [facility] and page to a number that you can be directly reached. If you still have difficulty reaching the provider, please page the [facility] (Director on Call) for the Hospitalists listed on amion for assistance.

## 2020-12-16 NOTE — Evaluation (Signed)
Physical Therapy Evaluation Patient Details Name: [name] MRN: [id number] DOB: 1935/01/27 Today's Date: 12/16/2020   History of Present Illness  86 y.o. female presenting to ED 12/10/20 with abdominal pain, nausea and vomiting. CT significant for SBO. NG tube placement made difficult by large hiatal hernia.  PMH: afib;  HTN; hypothyroidism; pacemaker placement; PAD; CVID; and NHL in remission  Clinical Impression  PTA pt living alone in two story home with steps to enter and chair lift to second floor. Pt independent in ambulation and bADLs and iADLS. On entry pt with c/o of 10/10 abdominal pain agreeable to providing home set up and PLOF however requests PT come back for mobility. With return pt reports abdominal pain is improved however pt is nauseous and only agreeable to bed level evaluation. Pt extremities WFL. PT will follow back for assessment of mobility tomorrow. PT currently recommending HHPT level rehab with intermittent supervision from daughter.     Follow Up Recommendations Home health PT;Supervision - Intermittent    Equipment Recommendations  None recommended by PT (has necessary equipment)    Recommendations for Other Services       Precautions / Restrictions Precautions Precautions: Other (comment) Precaution Comments: Enteric Restrictions Weight Bearing Restrictions: No      Mobility  Bed Mobility               General bed mobility comments: refuses due to nausea             Pertinent Vitals/Pain Pain Assessment: 0-10 Pain Score: 10-Worst pain ever Pain Location: abdomen Pain Descriptors / Indicators: Pressure;Sharp Pain Intervention(s): Limited activity within patient's tolerance;Monitored during session;Premedicated before session;Repositioned    Home Living Family/patient expects to be discharged to:: Private residence     Type of Home: House Home Access: Stairs to enter Entrance Stairs-Rails: Can reach both Entrance  Stairs-Number of Steps: 2 Home Layout: Two level;Bed/bath upstairs Home Equipment: Walker - 2 wheels;Bedside commode;Grab bars - toilet;Grab bars - tub/shower Additional Comments: chair lift upstairs    Prior Function Level of Independence: Independent         Comments: runs 3 companies, very active     Hand Dominance        Extremity/Trunk Assessment   Upper Extremity Assessment Upper Extremity Assessment: RUE deficits/detail;LUE deficits/detail RUE Deficits / Details: ROM WFL, strength grossly 4/5 LUE Deficits / Details: ROM WFL, strength grossly 4/5    Lower Extremity Assessment Lower Extremity Assessment: RLE deficits/detail;LLE deficits/detail RLE Deficits / Details: ROM WFL, strength grossly 4/5 LLE Deficits / Details: ROM WFL, strength grossly 4/5    Cervical / Trunk Assessment Cervical / Trunk Assessment: Other exceptions Cervical / Trunk Exceptions: scoliosis  Communication   Communication: No difficulties (limited communication due to pain)  Cognition Arousal/Alertness: Awake/alert Behavior During Therapy: Flat affect (pain limited) Overall Cognitive Status: Within Functional Limits for tasks assessed                                        General Comments General comments (skin integrity, edema, etc.): VSS on RA        Assessment/Plan    PT Assessment Patient needs continued PT services  PT Problem List Decreased strength;Decreased activity tolerance;Decreased mobility;Pain       PT Treatment Interventions DME instruction;Gait training;Stair training;Functional mobility training;Therapeutic activities;Therapeutic exercise;Balance training;Cognitive remediation;Patient/family education    PT Goals (Current goals can be found in the  Care Plan section)  Acute Rehab PT Goals Patient Stated Goal: feel better PT Goal Formulation: With patient/family Time For Goal Achievement: 12/30/20 Potential to Achieve Goals: Good    Frequency  Min 3X/week    AM-PAC PT "6 Clicks" Mobility  Outcome Measure Help needed turning from your back to your side while in a flat bed without using bedrails?: None Help needed moving from lying on your back to sitting on the side of a flat bed without using bedrails?: A Little Help needed moving to and from a bed to a chair (including a wheelchair)?: A Little Help needed standing up from a chair using your arms (e.g., wheelchair or bedside chair)?: A Little Help needed to walk in hospital room?: A Little Help needed climbing 3-5 steps with a railing? : A Lot 6 Click Score: 18    End of Session   Activity Tolerance: Patient limited by pain;Other (comment) (nausea) Patient left: in bed;with family/visitor present;with call bell/phone within reach Nurse Communication: Other (comment) (nausea) PT Visit Diagnosis: Muscle weakness (generalized) (M62.81)    Time: 1027-2536 and 6440-3474 PT Time Calculation (min) (ACUTE ONLY): 17 min   Charges:   PT Evaluation $PT Eval Moderate Complexity: 1 Mod          Ari Engelbrecht B. Migdalia Dk PT, DPT Acute Rehabilitation Services Pager [phone] Office [phone]   [place] 12/16/2020, 12:46 PM

## 2020-12-16 NOTE — Progress Notes (Signed)
6 Days Post-Op  Subjective: CC: Still with some nausea. No vomiting.  Diffuse abdominal pain again this am that required pain meds.  No flatus, had explosive diarrhea after trying to eat some FLD yesterday.  Patient apparently has a condition called common variable immunodeficiency and her daughter states she feels that is what is going on as the patient suffers from abdominal symptoms from this such as abdominal pain and diarrhea.  She is questioning about being transferred to [facility] where he immunologist is.  Objective: Vital signs in last 24 hours: Temp:  [97.8 F (36.6 C)-98.8 F (37.1 C)] 97.8 F (36.6 C) (06/20 0524) Pulse Rate:  [63-79] 79 (06/20 0524) Resp:  [16-18] 16 (06/20 0524) BP: (114-138)/(64-86) 130/70 (06/20 0524) SpO2:  [92 %-96 %] 94 % (06/20 0524) Last BM Date: 12/15/20  Intake/Output from previous day: No intake/output data recorded. Intake/Output this shift: No intake/output data recorded.  PE: Heart: regular Lungs: CTAB Abd: Soft with maybe minimal distention. NT currently but just received pain medications an hour ago, pain was diffuse at that time. Some BS  Lab Results:  Recent Labs    12/14/20 0619  WBC 9.8  HGB 13.0  HCT 39.8  PLT 225   BMET Recent Labs    12/14/20 0619  NA 137  K 4.0  CL 107  CO2 19*  GLUCOSE 79  BUN 9  CREATININE 0.75  CALCIUM 8.4*   PT/INR No results for input(s): LABPROT, INR in the last 72 hours. CMP     Component Value Date/Time   NA 137 12/14/2020 0619   NA 143 11/26/2017 1159   NA 143 06/16/2017 0921   K 4.0 12/14/2020 0619   K 3.8 06/16/2017 0921   CL 107 12/14/2020 0619   CL 109 (H) 12/14/2012 1009   CO2 19 (L) 12/14/2020 0619   CO2 28 06/16/2017 0921   GLUCOSE 79 12/14/2020 0619   GLUCOSE 69 (L) 06/16/2017 0921   GLUCOSE 64 (L) 12/14/2012 1009   BUN 9 12/14/2020 0619   BUN 18 11/26/2017 1159   BUN 11.7 06/16/2017 0921   CREATININE 0.75 12/14/2020 0619   CREATININE 0.80 06/14/2020 1335    CREATININE 0.8 06/16/2017 0921   CALCIUM 8.4 (L) 12/14/2020 0619   CALCIUM 9.7 06/16/2017 0921   PROT 7.9 12/10/2020 0304   PROT 7.5 06/16/2017 0921   ALBUMIN 4.9 12/10/2020 0304   ALBUMIN 3.9 06/16/2017 0921   AST 24 12/10/2020 0304   AST 28 06/14/2020 1335   AST 23 06/16/2017 0921   ALT 16 12/10/2020 0304   ALT 19 06/14/2020 1335   ALT 18 06/16/2017 0921   ALKPHOS 64 12/10/2020 0304   ALKPHOS 63 06/16/2017 0921   BILITOT 0.6 12/10/2020 0304   BILITOT 0.6 06/14/2020 1335   BILITOT 0.52 06/16/2017 0921   GFRNONAA >60 12/14/2020 0619   GFRNONAA >60 06/14/2020 1335   GFRNONAA >60 10/16/2010 1013   GFRAA >60 06/16/2019 1030   GFRAA >60 10/16/2010 1013   Lipase     Component Value Date/Time   LIPASE 19 12/10/2020 0304       Studies/Results: No results found.  Anti-infectives: Anti-infectives (From admission, onward)    None        Assessment/Plan SBO with history of common variable immunodeficiency as well as prior abdominal surgery - ate some FLD yesterday but then had explosive diarrhea -see above discussion patient and daughter have for her current condition -original CT scan reviewed and there were concerns for  an internal hernia, but she also had some thickening and inflammatory changes that may be related to that.  Given her history of the CVI, it is possible this could be from that given the history she provides to me. -will need to review all of her imaging and history of with MD so we can further decide on to proceed -repeat plain film today -we have to be aware patient may have multiple reasons for her symptoms from adhesive disease secondary to prior surgery vs the CVI.     FEN: FLD ID: none VTE: SCDs, lovenox   Per primary team: Hiatal Hernia HTN Hypothyroidism A.fib Permanent pacemaker placement 2013 PAD NHL in remission   LOS: 6 days    Henreitta Cea , [facility] 12/16/2020, 9:45 AM Please see Amion for pager  number during day hours 7:00am-4:30pm

## 2020-12-17 ENCOUNTER — Inpatient Hospital Stay (HOSPITAL_COMMUNITY): Payer: Medicare Other

## 2020-12-17 DIAGNOSIS — E440 Moderate protein-calorie malnutrition: Secondary | ICD-10-CM | POA: Insufficient documentation

## 2020-12-17 LAB — RESP PANEL BY RT-PCR (FLU A&B, COVID) ARPGX2
Influenza A by PCR: NEGATIVE
Influenza B by PCR: NEGATIVE
SARS Coronavirus 2 by RT PCR: NEGATIVE

## 2020-12-17 LAB — GASTROINTESTINAL PANEL BY PCR, STOOL (REPLACES STOOL CULTURE)

## 2020-12-17 LAB — C-REACTIVE PROTEIN: CRP: 8.9 mg/dL — ABNORMAL HIGH (ref <1.0)

## 2020-12-17 LAB — BRAIN NATRIURETIC PEPTIDE: B Natriuretic Peptide: 230.4 pg/mL — ABNORMAL HIGH (ref 0.0–100.0)

## 2020-12-17 MED ORDER — KCL-LACTATED RINGERS-D5W 20 MEQ/L IV SOLN
INTRAVENOUS | Status: DC
  Filled 2020-12-17 (×3): qty 1000

## 2020-12-17 MED ORDER — SODIUM CHLORIDE 0.9 % IV SOLN
2.0000 g | INTRAVENOUS | Status: DC
  Administered 2020-12-17: 2 g via INTRAVENOUS
  Filled 2020-12-17: qty 20

## 2020-12-17 MED ORDER — IOHEXOL 300 MG/ML  SOLN
100.0000 mL | Freq: Once | INTRAMUSCULAR | Status: AC | PRN
  Administered 2020-12-17: 100 mL via INTRAVENOUS

## 2020-12-17 MED ORDER — POTASSIUM CHLORIDE 10 MEQ/100ML IV SOLN
10.0000 meq | INTRAVENOUS | Status: AC
  Administered 2020-12-17 (×3): 10 meq via INTRAVENOUS
  Filled 2020-12-17 (×3): qty 100

## 2020-12-17 MED ORDER — METRONIDAZOLE 500 MG/100ML IV SOLN
500.0000 mg | Freq: Three times a day (TID) | INTRAVENOUS | Status: DC
  Administered 2020-12-17 (×2): 500 mg via INTRAVENOUS
  Filled 2020-12-17 (×2): qty 100

## 2020-12-17 MED ORDER — SODIUM CHLORIDE 0.9 % IV SOLN: 1.0000 g | INTRAVENOUS | Status: DC

## 2020-12-17 MED ORDER — MAGNESIUM SULFATE 2 GM/50ML IV SOLN
2.0000 g | Freq: Once | INTRAVENOUS | Status: AC
  Administered 2020-12-17: 2 g via INTRAVENOUS
  Filled 2020-12-17: qty 50

## 2020-12-17 MED ORDER — IOHEXOL 9 MG/ML PO SOLN
ORAL | Status: AC
  Filled 2020-12-17: qty 1000

## 2020-12-17 NOTE — Progress Notes (Addendum)
PROGRESS NOTE    [name]   ENI:[id number]  DOB: 1934-10-04  PCP: Janith Lima, MD    DOA: 12/10/2020 LOS: 7   Assessment & Plan   Principal Problem:   SBO (small bowel obstruction) ([place]) Active Problems:   Hypothyroidism   Essential hypertension   Pacemaker   Grade 3 follicular lymphoma of lymph nodes of neck (HCC)   PAD (peripheral artery disease) (HCC)   Malnutrition of moderate degree   SBO - in setting of distant partial hysterectomy (1980's).  CT abdomen/pelvis showed SBO with possible transition point in the distal ileum.   6/17 - trial of NGT clamped unsuccessful, returned to suction 6/18 - +flatus, improving NG output, NG removed PM 619 - +BM this AM, tolerates some full liquids, +nausea 6/20 - ongoing pain and nausea; Xray c/w likely recurring bowel obstruction 6/21 - N/V improved, feels slightly better, had diarrhea --General surgery managing / considering ex-lap --CT abd/pelvis with PO and IV contrast today --SBO protocol --NG tube removed afternoon 6/18 --NPO again --IV or PO antiemetics - Phenergan more helpful vs Zofran, both ordered --Pain control PRN  --Ambulate for bowel function --IV fluids - cont D5-LR until tolerating adequate PO  --Dietitian consulted - may need TPN --Resumed PO meds where IV substitutes used  Abdominal Pain, Nausea, Vomiting New Diarrhea - with onset during improvement of SBO, suspect post-obstructive etiology vs less likely infection. However, given immune suppression, low grade temp and rising wbc...   6/21 placed on empiric Rocephin/Flagyl as stool had been sent.  Only couple episodes diarrhea per report, not c/w C diff. GI panel later resulted +E coli (EAEC).  Discussed with ID - hold off antibiotics. --ID consulted, will see pt tomorrow --Hold Abx --Stool culture pending - follow up --No need to check c diff as extremely low clinical suspicion --recheck Covid (given also cough, low grade temp) - see below re  d/w immunologist --check CRP  Hypokalemia / Hypomagnesemia - secondary to above. Replacing for K 3.1 and Mg 1.5 --K-Cl added to fluids plus 10 mEq IVPB x3 --IV Mg 2g --AM labs, replace PRN  Bilateral Pleural Effusions, Small Pericardial Effusion - Will get echo for further evaluation.  Has been on maintenance fluids.  Prior hx of diastolic CHF.  Follow up on Echo.   Check BNP.  Cough - reported 6/20. No fevers or chills, but with CVID is prone to respiratory infections.  Chest xray showed only bibasilar atelectasis and large hiatal hernia.  --6/21 no c/o cough today but low grade temps --repeat covid test pending --? Due to volume overload (echo and BNP pending)  Oral Pain / Throat pain - Resolved - oral pain at back upper molar after endoscope for NGT placement.  Did not see any trauma on my exam.    Throat pain 2/2 NG tube - Improved w/tube out. --Chloraseptic spray, viscous lidocaine  Palpitations - overnight 6/16-17.  PM in place.  HR's okay. --Telemetry to monitor --[employer] rep to come interrogate PM   Large hiatal hernia - likely caused difficulty with NG placement.  Monitor.   CVID (common variable immunodeficiency -  Pt follows at [facility] with Dr. Achilles Dunk (immunologist).  I spoke to their on-call fellow 6/20 - advised most important to continue Hizentra and rule out any infection.  Otherwise usual SBO evaluation and management.  They would be happy to take care of her at [facility] if transfer requested, but unlikely they would manage differently 6/21 - spoke with Dr.  Lugar this afternoon. Requested to retest for Covid as case/s with similar presentation, in addition to new cough and low grade temp. --Continue Hizentra subcutaneous infusion 10 g weekly (on Wednesday) (pt's med brought from home).   Hx of recurrent sinopulmonary infections, occasional GI symptoms which are quite different than current presentation. --Hizentra given 6/15, next dose on 6/22   Non-hodgin's  Lymphoma / Follicular - follow with Dr. Julien Nordmann, last visit Dec 2021.  No acute issues.  Monitor.    PAD - Follows with Dr. Stanford Breed, last seen in Dec 2021, was to start ASA and statin.  These not on her med history(?) --Outpatient follow up  Pacemaker in place -on admission, patient inquired about interrogation of device.  Cardiology contacted [employer] rep on 6/18 and rep to come in within next day or two for interrogation.  A. Fib - rate controlled, as needed IV Lopressor for now while n.p.o.  Appears not on anticoagulation.  History of hypertension - regimen appears lisinopril and Lopressor - on hold and BP's controlled. As needed IV Lopressor for now.  Resume home meds when indicated and if tolerating PO meds.  Hypothyroidism - continue Synthroid     Patient BMI: Body mass index is 24.8 kg/m.    DVT prophylaxis: enoxaparin (LOVENOX) injection 40 mg Start: 12/11/20 2000 SCDs Start: 12/10/20 1059   Diet:  Diet Orders (From admission, onward)     Start     Ordered   12/17/20 0001  Diet NPO time specified  Diet effective midnight        12/16/20 1458              Code Status: Full Code   Brief Narrative / Hospital Course to Date:   Per H&P by Dr. Lorin Mercy: "[name] [name] is a 86 y.o. female with medical history significant of afib;  HTN; hypothyroidism; pacemaker placement; PAD; CVID; and NHL in remission presenting with abdominal pain.  She had a normal day yesterday and ate a small but regular dinner last night.  She had crampy abdominal pain starting about 2030 and had a normal BM about 2100.  The pain continued to escalate and she developed n/v.  Despite multiple (now 6) attempts, NG tube placement has been unsuccessful.  She is feeling mildly better, though, and has not vomited since she was at [facility] (transferred ER:ER).  She previously had a vaginal partial hysterectomy but has not otherwise had abdominal surgery.   ED Course: Sent [facility] to [facility].  SBO with concern for  vascular lymphatic compromise, surgery will see but requests [facility] admission."  NG tube placement proved very challenging, with 6 unsuccessful attempts.  Ultimately, GI was able to place NG tube via endoscopy.  Patient's large hiatal hernia felt to be the complicating factor.      Subjective 12/17/20    Patient says feeling a little bit better this AM. Less bloated feeling than yesterday.  Denies N/V this AM.  She and daughter discussed ex-lap possibility with surgery earlier, and further evaluation including imaging planned today before proceeding.  Disposition Plan & Communication   Status is: Inpatient  Remains inpatient appropriate because:IV treatments appropriate due to intensity of illness or inability to take PO  Dispo: The patient is from: Home              Anticipated d/c is to: Home              Patient currently is not medically stable to d/c.   Difficult  to place patient No  Family Communication: Daughter at bedside on rounds daily   Consults, Procedures, Significant Events   Consultants:  General surgery Gastroenterology  Procedures:  NG tube placement under endoscopic guidance by GI  Antimicrobials:  Anti-infectives (From admission, onward)    Start     Dose/Rate Route Frequency Ordered Stop   12/17/20 0930  cefTRIAXone (ROCEPHIN) 2 g in sodium chloride 0.9 % 100 mL IVPB        2 g 200 mL/hr over 30 Minutes Intravenous Every 24 hours 12/17/20 0840     12/17/20 0915  cefTRIAXone (ROCEPHIN) 1 g in sodium chloride 0.9 % 100 mL IVPB  Status:  Discontinued        1 g 200 mL/hr over 30 Minutes Intravenous Every 24 hours 12/17/20 0819 12/17/20 0840   12/17/20 0845  metroNIDAZOLE (FLAGYL) IVPB 500 mg        500 mg 100 mL/hr over 60 Minutes Intravenous Every 8 hours 12/17/20 0819           Micro    Objective   Vitals:   12/16/20 1839 12/16/20 2317 12/17/20 0522 12/17/20 1220  BP: 119/74 136/78 (!) 154/73 104/74  Pulse: 70 78 87 67  Resp: 16 19 20 18    Temp: 98.5 F (36.9 C) 99.8 F (37.7 C) 99.1 F (37.3 C) 99 F (37.2 C)  TempSrc: Oral Oral Oral Oral  SpO2: 92% 92% 96% 97%  Weight:      Height:        Intake/Output Summary (Last 24 hours) at 12/17/2020 1547 Last data filed at 12/17/2020 1530 Gross per 24 hour  Intake 1558.73 ml  Output --  Net 1558.73 ml   Filed Weights   12/10/20 0259  Weight: 63.5 kg    Physical Exam:  General exam: awake, alert, no acute distress, appears feeling a little better today  Respiratory system: CTAB, on room air. Cardiovascular system: RRR, nno pedal edema.   Gastrointestinal system: soft, non-tender, non-distended, +bs Central nervous system: A&O x3. Grossly non-focal exam.  Labs   Data Reviewed: I have personally reviewed following labs and imaging studies  CBC: Recent Labs  Lab 12/11/20 0631 12/12/20 0515 12/14/20 0619 12/16/20 0928  WBC 12.6* 12.7* 9.8 7.8  HGB 12.4 13.3 13.0 13.5  HCT 38.7 39.8 39.8 39.9  MCV 99.5 95.7 97.3 92.8  PLT 170 200 225 157   Basic Metabolic Panel: Recent Labs  Lab 12/11/20 0631 12/12/20 0515 12/14/20 0619 12/16/20 0928  NA 137 137 137 140  K 3.9 3.5 4.0 3.1*  CL 105 99 107 108  CO2 25 26 19* 27  GLUCOSE 101* 80 79 119*  BUN 15 15 9 9   CREATININE 0.63 0.74 0.75 0.48  CALCIUM 8.4* 8.8* 8.4* 8.3*  MG  --  1.9 1.9 1.6*   GFR: Estimated Creatinine Clearance: 46.9 mL/min (by C-G formula based on SCr of 0.48 mg/dL). Liver Function Tests: No results for input(s): AST, ALT, ALKPHOS, BILITOT, PROT, ALBUMIN in the last 168 hours.  No results for input(s): LIPASE, AMYLASE in the last 168 hours.  No results for input(s): AMMONIA in the last 168 hours. Coagulation Profile: No results for input(s): INR, PROTIME in the last 168 hours. Cardiac Enzymes: No results for input(s): CKTOTAL, CKMB, CKMBINDEX, TROPONINI in the last 168 hours. BNP (last 3 results) No results for input(s): PROBNP in the last 8760 hours. HbA1C: No results for  input(s): HGBA1C in the last 72 hours. CBG: No results for input(s):  GLUCAP in the last 168 hours. Lipid Profile: No results for input(s): CHOL, HDL, LDLCALC, TRIG, CHOLHDL, LDLDIRECT in the last 72 hours. Thyroid Function Tests: No results for input(s): TSH, T4TOTAL, FREET4, T3FREE, THYROIDAB in the last 72 hours. Anemia Panel: No results for input(s): VITAMINB12, FOLATE, FERRITIN, TIBC, IRON, RETICCTPCT in the last 72 hours. Sepsis Labs: No results for input(s): PROCALCITON, LATICACIDVEN in the last 168 hours.   Recent Results (from the past 240 hour(s))  Resp Panel by RT-PCR (Flu A&B, Covid) Nasopharyngeal Swab     Status: None   Collection Time: 12/10/20  6:04 AM   Specimen: Nasopharyngeal Swab; Nasopharyngeal(NP) swabs in vial transport medium  Result Value Ref Range Status   SARS Coronavirus 2 by RT PCR NEGATIVE NEGATIVE Final    Comment: (NOTE) SARS-CoV-2 target nucleic acids are NOT DETECTED.  The SARS-CoV-2 RNA is generally detectable in upper respiratory specimens during the acute phase of infection. The lowest concentration of SARS-CoV-2 viral copies this assay can detect is 138 copies/mL. A negative result does not preclude SARS-Cov-2 infection and should not be used as the sole basis for treatment or other patient management decisions. A negative result may occur with  improper specimen collection/handling, submission of specimen other than nasopharyngeal swab, presence of viral mutation(s) within the areas targeted by this assay, and inadequate number of viral copies(<138 copies/mL). A negative result must be combined with clinical observations, patient history, and epidemiological information. The expected result is Negative.  Fact Sheet for Patients:  EntrepreneurPulse.com.au  Fact Sheet for Healthcare Providers:  IncredibleEmployment.be  This test is no t yet approved or cleared by the [place] FDA and  has been  authorized for detection and/or diagnosis of SARS-CoV-2 by FDA under an Emergency Use Authorization (EUA). This EUA will remain  in effect (meaning this test can be used) for the duration of the COVID-19 declaration under Section 564(b)(1) of the Act, 21 U.S.C.section 360bbb-3(b)(1), unless the authorization is terminated  or revoked sooner.       Influenza A by PCR NEGATIVE NEGATIVE Final   Influenza B by PCR NEGATIVE NEGATIVE Final    Comment: (NOTE) The Xpert Xpress SARS-CoV-2/FLU/RSV plus assay is intended as an aid in the diagnosis of influenza from Nasopharyngeal swab specimens and should not be used as a sole basis for treatment. Nasal washings and aspirates are unacceptable for Xpert Xpress SARS-CoV-2/FLU/RSV testing.  Fact Sheet for Patients: EntrepreneurPulse.com.au  Fact Sheet for Healthcare Providers: IncredibleEmployment.be  This test is not yet approved or cleared by the [place] FDA and has been authorized for detection and/or diagnosis of SARS-CoV-2 by FDA under an Emergency Use Authorization (EUA). This EUA will remain in effect (meaning this test can be used) for the duration of the COVID-19 declaration under Section 564(b)(1) of the Act, 21 U.S.C. section 360bbb-3(b)(1), unless the authorization is terminated or revoked.  Performed at [facility], [address] [place], [place] [address]   Gastrointestinal Panel by PCR , Stool     Status: Abnormal   Collection Time: 12/16/20  5:29 PM   Specimen: Stool  Result Value Ref Range Status   Campylobacter species NOT DETECTED NOT DETECTED Final   Plesimonas shigelloides NOT DETECTED NOT DETECTED Final   Salmonella species NOT DETECTED NOT DETECTED Final   Yersinia enterocolitica NOT DETECTED NOT DETECTED Final   Vibrio species NOT DETECTED NOT DETECTED Final   Vibrio cholerae NOT DETECTED NOT DETECTED Final   Enteroaggregative E coli (EAEC) DETECTED  (A) NOT DETECTED  Final    Comment: RESULT CALLED TO, READ BACK BY AND VERIFIED WITH: MIRIAM PEETEH 12/17/20 1317 KLW    Enteropathogenic E coli (EPEC) NOT DETECTED NOT DETECTED Final   Enterotoxigenic E coli (ETEC) NOT DETECTED NOT DETECTED Final   Shiga like toxin producing E coli (STEC) NOT DETECTED NOT DETECTED Final   Shigella/Enteroinvasive E coli (EIEC) NOT DETECTED NOT DETECTED Final   Cryptosporidium NOT DETECTED NOT DETECTED Final   Cyclospora cayetanensis NOT DETECTED NOT DETECTED Final   Entamoeba histolytica NOT DETECTED NOT DETECTED Final   Giardia lamblia NOT DETECTED NOT DETECTED Final   Adenovirus F40/41 NOT DETECTED NOT DETECTED Final   Astrovirus NOT DETECTED NOT DETECTED Final   Norovirus GI/GII NOT DETECTED NOT DETECTED Final   Rotavirus A NOT DETECTED NOT DETECTED Final   Sapovirus (I, II, IV, and V) NOT DETECTED NOT DETECTED Final    Comment: Performed at [facility], [address] [place], [place] [address]      Imaging Studies   DG CHEST PORT 1 VIEW  Result Date: 12/16/2020 CLINICAL DATA:  Cough for 1 week. EXAM: PORTABLE CHEST 1 VIEW COMPARISON:  04/21/2017 chest radiograph, 06/14/2020 chest CT and prior studies FINDINGS: The cardiomediastinal silhouette is unchanged with a large hiatal hernia again noted. Mild bibasilar atelectasis, LEFT greater than RIGHT, noted. A LEFT-sided pacemaker is unchanged. There is no evidence of focal airspace disease, pulmonary edema, suspicious pulmonary nodule/mass, pleural effusion, or pneumothorax. No acute bony abnormalities are identified. IMPRESSION: 1. Mild bibasilar atelectasis. 2. Large hiatal hernia. Electronically Signed   By: Margarette Canada M.D.   On: 12/16/2020 11:44   DG Abd Portable 1V  Result Date: 12/16/2020 CLINICAL DATA:  Abdominal pain.  Recent small bowel obstruction. EXAM: PORTABLE ABDOMEN - 1 VIEW COMPARISON:  12/14/2020 radiographs, 12/10/2020 CT and prior studies FINDINGS: NG tube is been removed.  Increasing gaseous distension of small bowel is noted from 12/14/2020. Some gas is noted within the colon and rectum. No other significant changes are noted. Severe degenerative changes and scoliosis of the thoracolumbar spine noted. IMPRESSION: Increasing gaseous distension of small bowel from 12/14/2020 which may represent recurrent small-bowel obstruction. Electronically Signed   By: Margarette Canada M.D.   On: 12/16/2020 10:19     Medications   Scheduled Meds:  enoxaparin (LOVENOX) injection  40 mg Subcutaneous Q24H   gabapentin  300 mg Oral QID   Immune Globulin (Human)  10 g Subcutaneous Q Wed   iohexol       levothyroxine  100 mcg Oral Q0600   olopatadine  1 drop Both Eyes BID   pantoprazole  40 mg Oral Daily   Continuous Infusions:  cefTRIAXone (ROCEPHIN)  IV 2 g (12/17/20 1039)   dextrose 5% lactated ringers Stopped (12/17/20 1404)   lactated ringers     methocarbamol (ROBAXIN) IV     metronidazole 100 mL/hr at 12/17/20 1530   promethazine (PHENERGAN) injection (IM or IVPB) 200 mL/hr at 12/16/20 1822       LOS: 7 days    Time spent: 45 minutes with >50% spent at bedside and in coordination of care.    Ezekiel Slocumb, DO [facility]  12/17/2020, 3:47 PM      If 7PM-7AM, please contact night-coverage. How to contact the [facility] Attending or Consulting provider [place] or covering provider during after hours [place], for this patient?    Check the care team in [facility] and look for a) attending/consulting [place] provider listed and  b) the [facility] team listed Log into [email or web address] and use [place]'s universal password to access. If you do not have the password, please contact the hospital operator. Locate the [facility] provider you are looking for under [facility] and page to a number that you can be directly reached. If you still have difficulty reaching the provider, please page the [facility] (Director on Call) for the Hospitalists listed on amion for assistance.

## 2020-12-17 NOTE — Progress Notes (Signed)
7 Days Post-Op  Subjective: CC: Still with nausea and doesn't really feel much better.  Still had one episode of diarrhea yesterday, but nothing else since.  Last took pain meds at 12 last night.    Objective: Vital signs in last 24 hours: Temp:  [97.5 F (36.4 C)-99.8 F (37.7 C)] 99.1 F (37.3 C) (06/21 0522) Pulse Rate:  [70-87] 87 (06/21 0522) Resp:  [16-20] 20 (06/21 0522) BP: (119-154)/(73-99) 154/73 (06/21 0522) SpO2:  [91 %-96 %] 96 % (06/21 0522) Last BM Date: 12/16/20  Intake/Output from previous day: 06/20 0701 - 06/21 0700 In: 898.5 [I.V.:848.5; IV Piggyback:50] Out: -  Intake/Output this shift: No intake/output data recorded.  PE: Heart: regular Lungs: CTAB Abd: Soft but with some increase distention of her lower abdomen with some tympany. NT currently, Some BS.  Lab Results:  Recent Labs    12/16/20 0928  WBC 7.8  HGB 13.5  HCT 39.9  PLT 250   BMET Recent Labs    12/16/20 0928  NA 140  K 3.1*  CL 108  CO2 27  GLUCOSE 119*  BUN 9  CREATININE 0.48  CALCIUM 8.3*   PT/INR No results for input(s): LABPROT, INR in the last 72 hours. CMP     Component Value Date/Time   NA 140 12/16/2020 0928   NA 143 11/26/2017 1159   NA 143 06/16/2017 0921   K 3.1 (L) 12/16/2020 0928   K 3.8 06/16/2017 0921   CL 108 12/16/2020 0928   CL 109 (H) 12/14/2012 1009   CO2 27 12/16/2020 0928   CO2 28 06/16/2017 0921   GLUCOSE 119 (H) 12/16/2020 0928   GLUCOSE 69 (L) 06/16/2017 0921   GLUCOSE 64 (L) 12/14/2012 1009   BUN 9 12/16/2020 0928   BUN 18 11/26/2017 1159   BUN 11.7 06/16/2017 0921   CREATININE 0.48 12/16/2020 0928   CREATININE 0.80 06/14/2020 1335   CREATININE 0.8 06/16/2017 0921   CALCIUM 8.3 (L) 12/16/2020 0928   CALCIUM 9.7 06/16/2017 0921   PROT 7.9 12/10/2020 0304   PROT 7.5 06/16/2017 0921   ALBUMIN 4.9 12/10/2020 0304   ALBUMIN 3.9 06/16/2017 0921   AST 24 12/10/2020 0304   AST 28 06/14/2020 1335   AST 23 06/16/2017 0921   ALT 16  12/10/2020 0304   ALT 19 06/14/2020 1335   ALT 18 06/16/2017 0921   ALKPHOS 64 12/10/2020 0304   ALKPHOS 63 06/16/2017 0921   BILITOT 0.6 12/10/2020 0304   BILITOT 0.6 06/14/2020 1335   BILITOT 0.52 06/16/2017 0921   GFRNONAA >60 12/16/2020 0928   GFRNONAA >60 06/14/2020 1335   GFRNONAA >60 10/16/2010 1013   GFRAA >60 06/16/2019 1030   GFRAA >60 10/16/2010 1013   Lipase     Component Value Date/Time   LIPASE 19 12/10/2020 0304       Studies/Results: DG CHEST PORT 1 VIEW  Result Date: 12/16/2020 CLINICAL DATA:  Cough for 1 week. EXAM: PORTABLE CHEST 1 VIEW COMPARISON:  04/21/2017 chest radiograph, 06/14/2020 chest CT and prior studies FINDINGS: The cardiomediastinal silhouette is unchanged with a large hiatal hernia again noted. Mild bibasilar atelectasis, LEFT greater than RIGHT, noted. A LEFT-sided pacemaker is unchanged. There is no evidence of focal airspace disease, pulmonary edema, suspicious pulmonary nodule/mass, pleural effusion, or pneumothorax. No acute bony abnormalities are identified. IMPRESSION: 1. Mild bibasilar atelectasis. 2. Large hiatal hernia. Electronically Signed   By: Margarette Canada M.D.   On: 12/16/2020 11:44   DG Abd  Portable 1V  Result Date: 12/16/2020 CLINICAL DATA:  Abdominal pain.  Recent small bowel obstruction. EXAM: PORTABLE ABDOMEN - 1 VIEW COMPARISON:  12/14/2020 radiographs, 12/10/2020 CT and prior studies FINDINGS: NG tube is been removed. Increasing gaseous distension of small bowel is noted from 12/14/2020. Some gas is noted within the colon and rectum. No other significant changes are noted. Severe degenerative changes and scoliosis of the thoracolumbar spine noted. IMPRESSION: Increasing gaseous distension of small bowel from 12/14/2020 which may represent recurrent small-bowel obstruction. Electronically Signed   By: Margarette Canada M.D.   On: 12/16/2020 10:19    Anti-infectives: Anti-infectives (From admission, onward)    Start     Dose/Rate Route  Frequency Ordered Stop   12/17/20 0915  cefTRIAXone (ROCEPHIN) 1 g in sodium chloride 0.9 % 100 mL IVPB        1 g 200 mL/hr over 30 Minutes Intravenous Every 24 hours 12/17/20 0819     12/17/20 0915  metroNIDAZOLE (FLAGYL) IVPB 500 mg        500 mg 100 mL/hr over 60 Minutes Intravenous Every 8 hours 12/17/20 0819          Assessment/Plan SBO with history of common variable immunodeficiency as well as prior abdominal surgery - patient remains about the same.  X-ray yesterday concerning for recurrence of her SBO.  My suspicion is she has a high-grade PSBO as she is having some diarrhea get through, but she still remains distended with dilated SB c/w obstructive process. -we discussed this etiology and that I thought this was more likely the cause of her diarrhea over a GI pathogen etc. -we also discussed the need within the next 24-48 hrs for artificial nutritional support given she has been without significant oral intake in now over a week.  We discussed how this helps with healing, etc moving forward if she chooses to proceed with surgery -we discussed surgery and a diagnostic laparoscopy with possible laparotomy at this point given she has not resolved this problem in a week.  I told her that we did talk to the [facility] Immunology group and they assured [place] that CVI does not cause SBO and we should treat this per our normal standard of care approach. -the patient and daughter seem hesitant.  I explained that I am giving them facts and not trying to push them one way or the other as it it ultimately their decision how to move forward; however, my concern is that if this prolongs too much we can have higher risk of complications, etc.  They are going to discuss what they would like to do and then let us know.      FEN: NPO ID: none VTE: SCDs, lovenox   Per primary team: Hiatal Hernia HTN Hypothyroidism A.fib Permanent pacemaker placement 2013 PAD NHL in remission Common Variable  immunodeficiency    LOS: 7 days    Henreitta Cea , [facility] 12/17/2020, 8:30 AM Please see Amion for pager number during day hours 7:00am-4:30pm

## 2020-12-17 NOTE — Progress Notes (Addendum)
Initial Nutrition Assessment  DOCUMENTATION CODES:   Non-severe (moderate) malnutrition in context of acute illness/injury  INTERVENTION:   Agree with TPN initiation, given moderate malnutrition, and minimal intake x 7 days with potential surgery in the next few days, which will extend inadequate oral intake.  NUTRITION DIAGNOSIS:   Moderate Malnutrition related to acute illness (SBO) as evidenced by mild fat depletion, mild muscle depletion, moderate muscle depletion.  GOAL:   Patient will meet greater than or equal to 90% of their needs  MONITOR:   Labs, I & O's, Diet advancement  REASON FOR ASSESSMENT:   Consult New TPN/TNA  ASSESSMENT:   86 yo female admitted with SBO. PMH includes hypothyroidism, A fib, HTN, hypothyroidism, CVID, pacemaker, PAD, NHL in remission.  Patient reports good intake at home with weight stable between 135 and 140 lbs. Since admission, intake has been minimal. Spoke with patient about the benefits of starting TPN to meet nutrition needs until she can take POs adequately. Patient was receptive.  NG tube unable to be placed at bedside or via IR with fluoroscopy on admission. S/P upper endoscopy 6/14; found to have abnormal anatomy with tortuous esophagus and a large amount of food was found in the cardia, gastric fundus, and gastric body. NG tube was placed with tip in the gastric cavity.   NG tube was removed 6/18.  Diet was advanced to full liquids 6/19, but abdomen became distended and patient was nauseous. Repeat x-ray 6/20 concerning for recurrence of her SBO. Patient was made NPO today. Potential for surgery in the next few days. Will likely require TPN for nutrition support.   Labs reviewed. K 3.1 GI panel and stool culture pending.   Medications reviewed and include IV Rocephin, IV Flagyl. IVF: D5 LR at 75 ml/h  Admission weight 63.5 kg. No new weight since admission.  Patient meets criteria for moderate malnutrition, given mild  depletion of muscle and subcutaneous fat mass with inadequate intake since admission (7 days).  NUTRITION - FOCUSED PHYSICAL EXAM:  Flowsheet Row Most Recent Value  Orbital Region Mild depletion  Upper Arm Region Mild depletion  Thoracic and Lumbar Region Mild depletion  Buccal Region Mild depletion  Temple Region Mild depletion  Clavicle Bone Region Moderate depletion  Clavicle and Acromion Bone Region Moderate depletion  Scapular Bone Region Moderate depletion  Dorsal Hand Moderate depletion  Patellar Region No depletion  Anterior Thigh Region No depletion  Posterior Calf Region Mild depletion  Edema (RD Assessment) None  Hair Reviewed  Eyes Reviewed  Mouth Reviewed  Skin Reviewed  Nails Reviewed       Diet Order:   Diet Order             Diet NPO time specified  Diet effective midnight                   EDUCATION NEEDS:   Education needs have been addressed  Skin:  Skin Assessment: Reviewed RN Assessment  Last BM:  6/20 type 6  Height:   Ht Readings from Last 1 Encounters:  12/10/20 5\' 3"  (1.6 m)    Weight:   Wt Readings from Last 1 Encounters:  12/10/20 63.5 kg    Ideal Body Weight:  52.3 kg  BMI:  Body mass index is 24.8 kg/m.  Estimated Nutritional Needs:   Kcal:  1650-1900  Protein:  90-100 gm  Fluid:  >/= 1.7 L    Lucas Mallow, RD, LDN, CNSC Please refer to Amion for contact  information.

## 2020-12-18 ENCOUNTER — Inpatient Hospital Stay (HOSPITAL_COMMUNITY): Payer: Medicare Other

## 2020-12-18 DIAGNOSIS — R197 Diarrhea, unspecified: Secondary | ICD-10-CM

## 2020-12-18 DIAGNOSIS — E440 Moderate protein-calorie malnutrition: Secondary | ICD-10-CM

## 2020-12-18 DIAGNOSIS — I313 Pericardial effusion (noninflammatory): Secondary | ICD-10-CM | POA: Diagnosis not present

## 2020-12-18 DIAGNOSIS — I739 Peripheral vascular disease, unspecified: Secondary | ICD-10-CM

## 2020-12-18 DIAGNOSIS — E039 Hypothyroidism, unspecified: Secondary | ICD-10-CM

## 2020-12-18 DIAGNOSIS — D839 Common variable immunodeficiency, unspecified: Secondary | ICD-10-CM

## 2020-12-18 DIAGNOSIS — I10 Essential (primary) hypertension: Secondary | ICD-10-CM

## 2020-12-18 DIAGNOSIS — Z950 Presence of cardiac pacemaker: Secondary | ICD-10-CM

## 2020-12-18 LAB — BASIC METABOLIC PANEL
Anion gap: 9 (ref 5–15)
BUN: 5 mg/dL — ABNORMAL LOW (ref 8–23)
CO2: 28 mmol/L (ref 22–32)
Calcium: 7.9 mg/dL — ABNORMAL LOW (ref 8.9–10.3)
Chloride: 103 mmol/L (ref 98–111)
Creatinine, Ser: 0.6 mg/dL (ref 0.44–1.00)
GFR, Estimated: >60 mL/min (ref >60)
Glucose, Bld: 104 mg/dL — ABNORMAL HIGH (ref 70–99)
Potassium: 3.6 mmol/L (ref 3.5–5.1)
Sodium: 140 mmol/L (ref 135–145)

## 2020-12-18 LAB — CBC WITH DIFFERENTIAL/PLATELET
Abs Immature Granulocytes: 0.12 10*3/uL — ABNORMAL HIGH (ref 0.00–0.07)
Basophils Absolute: 0 10*3/uL (ref 0.0–0.1)
Basophils Relative: 0 %
Eosinophils Absolute: 0.4 10*3/uL (ref 0.0–0.5)
Eosinophils Relative: 4 %
HCT: 34.1 % — ABNORMAL LOW (ref 36.0–46.0)
Hemoglobin: 11.6 g/dL — ABNORMAL LOW (ref 12.0–15.0)
Immature Granulocytes: 1 %
Lymphocytes Relative: 27 %
Lymphs Abs: 2.4 10*3/uL (ref 0.7–4.0)
MCH: 32.1 pg (ref 26.0–34.0)
MCHC: 34 g/dL (ref 30.0–36.0)
MCV: 94.5 fL (ref 80.0–100.0)
Monocytes Absolute: 0.7 10*3/uL (ref 0.1–1.0)
Monocytes Relative: 8 %
Neutro Abs: 5.3 10*3/uL (ref 1.7–7.7)
Neutrophils Relative %: 60 %
Platelets: 272 10*3/uL (ref 150–400)
RBC: 3.61 MIL/uL — ABNORMAL LOW (ref 3.87–5.11)
RDW: 14.3 % (ref 11.5–15.5)
WBC: 8.8 10*3/uL (ref 4.0–10.5)
nRBC: 0 % (ref 0.0–0.2)

## 2020-12-18 LAB — ECHOCARDIOGRAM COMPLETE
AR max vel: 3.15 cm2
AV Area VTI: 2.99 cm2
AV Area mean vel: 2.96 cm2
AV Mean grad: 6 mmHg
AV Peak grad: 10.9 mmHg
Ao pk vel: 1.65 m/s
Area-P 1/2: 3.6 cm2
Height: 63 in
S' Lateral: 2.2 cm
Weight: 2240 oz

## 2020-12-18 LAB — MAGNESIUM: Magnesium: 2 mg/dL (ref 1.7–2.4)

## 2020-12-18 MED ORDER — METOPROLOL TARTRATE 25 MG PO TABS
25.0000 mg | ORAL_TABLET | Freq: Two times a day (BID) | ORAL | Status: DC
  Administered 2020-12-18 – 2020-12-19 (×2): 25 mg via ORAL
  Filled 2020-12-18 (×2): qty 1

## 2020-12-18 MED ORDER — POTASSIUM CHLORIDE CRYS ER 20 MEQ PO TBCR
40.0000 meq | EXTENDED_RELEASE_TABLET | Freq: Once | ORAL | Status: AC
  Administered 2020-12-18: 40 meq via ORAL
  Filled 2020-12-18: qty 2

## 2020-12-18 MED ORDER — IPRATROPIUM-ALBUTEROL 0.5-2.5 (3) MG/3ML IN SOLN: 3.0000 mL | Freq: Four times a day (QID) | RESPIRATORY_TRACT | Status: DC | PRN

## 2020-12-18 NOTE — Progress Notes (Signed)
8 Days Post-Op  Subjective: CC: Had CT yesterday afternoon. Since then has had numerous loose bowel movements, reports complete resolution in distention, no nausea or vomiting, no abdominal pain, hungry  Objective: Vital signs in last 24 hours: Temp:  [98.4 F (36.9 C)-99 F (37.2 C)] 98.4 F (36.9 C) (06/22 0611) Pulse Rate:  [62-83] 62 (06/22 0611) Resp:  [17-20] 17 (06/22 0611) BP: (104-135)/(71-92) 135/71 (06/22 0611) SpO2:  [90 %-99 %] 90 % (06/22 0611) Last BM Date: 12/16/20  Intake/Output from previous day: 06/21 0701 - 06/22 0700 In: 1455 [I.V.:1155; IV Piggyback:300] Out: -  Intake/Output this shift: No intake/output data recorded.  PE: Heart: regular Lungs: CTAB Abd: soft, NT/ND  Lab Results:  Recent Labs    12/16/20 0928 12/18/20 0458  WBC 7.8 8.8  HGB 13.5 11.6*  HCT 39.9 34.1*  PLT 250 272   BMET Recent Labs    12/16/20 0928 12/18/20 0458  NA 140 140  K 3.1* 3.6  CL 108 103  CO2 27 28  GLUCOSE 119* 104*  BUN 9 5*  CREATININE 0.48 0.60  CALCIUM 8.3* 7.9*   PT/INR No results for input(s): LABPROT, INR in the last 72 hours. CMP     Component Value Date/Time   NA 140 12/18/2020 0458   NA 143 11/26/2017 1159   NA 143 06/16/2017 0921   K 3.6 12/18/2020 0458   K 3.8 06/16/2017 0921   CL 103 12/18/2020 0458   CL 109 (H) 12/14/2012 1009   CO2 28 12/18/2020 0458   CO2 28 06/16/2017 0921   GLUCOSE 104 (H) 12/18/2020 0458   GLUCOSE 69 (L) 06/16/2017 0921   GLUCOSE 64 (L) 12/14/2012 1009   BUN 5 (L) 12/18/2020 0458   BUN 18 11/26/2017 1159   BUN 11.7 06/16/2017 0921   CREATININE 0.60 12/18/2020 0458   CREATININE 0.80 06/14/2020 1335   CREATININE 0.8 06/16/2017 0921   CALCIUM 7.9 (L) 12/18/2020 0458   CALCIUM 9.7 06/16/2017 0921   PROT 7.9 12/10/2020 0304   PROT 7.5 06/16/2017 0921   ALBUMIN 4.9 12/10/2020 0304   ALBUMIN 3.9 06/16/2017 0921   AST 24 12/10/2020 0304   AST 28 06/14/2020 1335   AST 23 06/16/2017 0921   ALT 16  12/10/2020 0304   ALT 19 06/14/2020 1335   ALT 18 06/16/2017 0921   ALKPHOS 64 12/10/2020 0304   ALKPHOS 63 06/16/2017 0921   BILITOT 0.6 12/10/2020 0304   BILITOT 0.6 06/14/2020 1335   BILITOT 0.52 06/16/2017 0921   GFRNONAA >60 12/18/2020 0458   GFRNONAA >60 06/14/2020 1335   GFRNONAA >60 10/16/2010 1013   GFRAA >60 06/16/2019 1030   GFRAA >60 10/16/2010 1013   Lipase     Component Value Date/Time   LIPASE 19 12/10/2020 0304       Studies/Results: CT ABDOMEN PELVIS W CONTRAST  Result Date: 12/17/2020 CLINICAL DATA:  Abdominal pain, recent small-bowel obstruction, suspected obstruction EXAM: CT ABDOMEN AND PELVIS WITH CONTRAST TECHNIQUE: Multidetector CT imaging of the abdomen and pelvis was performed using the standard protocol following bolus administration of intravenous contrast. Sagittal and coronal MPR images reconstructed from axial data set. CONTRAST:  113mL OMNIPAQUE IOHEXOL 300 MG/ML SOLN IV. Dilute oral contrast. COMPARISON:  12/10/2020 FINDINGS: Lower chest: BILATERAL pleural effusions in significant lower lobe atelectasis. Enlargement of cardiac chambers. Pacemaker leads RIGHT atrium and RIGHT ventricle. Small pericardial effusion. Hepatobiliary: Tiny RIGHT lobe liver cyst superiorly, stable. Gallbladder and liver otherwise normal appearance. Pancreas: Normal appearance  Spleen: Normal appearance Adrenals/Urinary Tract: Adrenal glands, kidneys, ureters, and bladder normal appearance Stomach/Bowel: Stomach decompressed. Normal appendix. Diverticulosis of sigmoid colon without evidence of diverticulitis. Remainder of colon and terminal ileum decompressed. Proximal small bowel loops are dilated. Transition from dilated to nondilated small bowel loops occurs in the upper RIGHT pelvis. Mild swirling of the mesentery is seen. This could be due to adhesion or an internal hernia. No bowel wall thickening or perforation. Vascular/Lymphatic: Atherosclerotic calcifications aorta and iliac  arteries. Aorta normal caliber. Scattered pelvic phleboliths. No adenopathy. Reproductive: Uterus surgically absent. Nonvisualization of ovaries. Other: Small amount of free fluid in pelvis. No free air or additional hernia. Musculoskeletal: Osseous demineralization. Degenerative changes and scoliosis of lumbar spine. IMPRESSION: Small-bowel obstruction with transition from dilated to nondilated small bowel loops in the upper RIGHT pelvis, question due to adhesion or an internal hernia. Small amount of nonspecific free fluid in pelvis. BILATERAL pleural effusions and significant lower lobe atelectasis. Enlargement of cardiac chambers with small pericardial effusion. Sigmoid diverticulosis without evidence of diverticulitis. Aortic Atherosclerosis (ICD10-I70.0). Electronically Signed   By: Lavonia Dana M.D.   On: 12/17/2020 16:39   DG CHEST PORT 1 VIEW  Result Date: 12/16/2020 CLINICAL DATA:  Cough for 1 week. EXAM: PORTABLE CHEST 1 VIEW COMPARISON:  04/21/2017 chest radiograph, 06/14/2020 chest CT and prior studies FINDINGS: The cardiomediastinal silhouette is unchanged with a large hiatal hernia again noted. Mild bibasilar atelectasis, LEFT greater than RIGHT, noted. A LEFT-sided pacemaker is unchanged. There is no evidence of focal airspace disease, pulmonary edema, suspicious pulmonary nodule/mass, pleural effusion, or pneumothorax. No acute bony abnormalities are identified. IMPRESSION: 1. Mild bibasilar atelectasis. 2. Large hiatal hernia. Electronically Signed   By: Margarette Canada M.D.   On: 12/16/2020 11:44   DG Abd Portable 1V  Result Date: 12/16/2020 CLINICAL DATA:  Abdominal pain.  Recent small bowel obstruction. EXAM: PORTABLE ABDOMEN - 1 VIEW COMPARISON:  12/14/2020 radiographs, 12/10/2020 CT and prior studies FINDINGS: NG tube is been removed. Increasing gaseous distension of small bowel is noted from 12/14/2020. Some gas is noted within the colon and rectum. No other significant changes are noted.  Severe degenerative changes and scoliosis of the thoracolumbar spine noted. IMPRESSION: Increasing gaseous distension of small bowel from 12/14/2020 which may represent recurrent small-bowel obstruction. Electronically Signed   By: Margarette Canada M.D.   On: 12/16/2020 10:19    Anti-infectives: Anti-infectives (From admission, onward)    Start     Dose/Rate Route Frequency Ordered Stop   12/17/20 0930  cefTRIAXone (ROCEPHIN) 2 g in sodium chloride 0.9 % 100 mL IVPB  Status:  Discontinued        2 g 200 mL/hr over 30 Minutes Intravenous Every 24 hours 12/17/20 0840 12/17/20 1650   12/17/20 0915  cefTRIAXone (ROCEPHIN) 1 g in sodium chloride 0.9 % 100 mL IVPB  Status:  Discontinued        1 g 200 mL/hr over 30 Minutes Intravenous Every 24 hours 12/17/20 0819 12/17/20 0840   12/17/20 0845  metroNIDAZOLE (FLAGYL) IVPB 500 mg  Status:  Discontinued        500 mg 100 mL/hr over 60 Minutes Intravenous Every 8 hours 12/17/20 0819 12/17/20 1650        Assessment/Plan SBO with history of common variable immunodeficiency as well as prior abdominal surgery  -She has had resolution of all symptoms. I reviewed the CT with her and her daughter. They are not interested in any surgical exploration at this juncture and  want to try food. Given clinical improvement and their wishes, would give clear liquid diet and advance as tolerated. We will follow with you   FEN: NPO ID: none VTE: SCDs, lovenox   Per primary team: Hiatal Hernia HTN Hypothyroidism A.fib Permanent pacemaker placement 2013 PAD NHL in remission Common Variable immunodeficiency    LOS: 8 days   Nadeen Landau, MD [facility] Use [email or web address] to contact on call provider

## 2020-12-18 NOTE — Consult Note (Signed)
[facility]    Date of Admission:  12/10/2020     Total days of antibiotics 1  Ceftriaxone and flagyl given 6/21             Reason for Consult: Diarrhea, CVID    Referring Provider: Arrien  Primary Care Provider: Janith Lima, MD   Assessment: [name] is a 86 y.o. female with history of CVID on weekly infusions of Hizentra for management through care of [facility] team here for management of abdominal pain. Overall improved since admission with SBO protocol and non-operative management. Her NG tube is out and she is hopeful to progress to more substantial diet.    We were consulted for a change in her diarrhea and + EAEC identified on GI pathogen PCR panel. Hard to know exactly what that means in her context, but it does not seem to be pathogenic.  Several confounders (CVID GI effects, liquid diet, current SBO, PO contrast) that could be contributing. She has remained afebrile with normalization of her leukocytosis and clinically she has slowly improved over the course of her hospitalization; diarrhea acutely worse yesterday that they attributed to likely contrast media flushing out. Has only had one semi formed/mushy BM today early this morning.   In review of scans, conversations documented with immunology team at [facility] and discussion with the patient and her daughter, would recommend not treating and watchful waiting to see how things resolve. She has chronic lower GI symptoms related most likely to her CVID and was actually being considered for oral immunoglobulin therapy trial from what her daughter speaks of. While her stool is not quite back to normal, suspect that she is trending in a good direction to baseline without the need of antibiotics.   Recommend ongoing supportive care and to continue IVIG as is routinely given (due today I believe). FU with her immunology team after hospitalization. If she goes on to develop more severe  symptoms or fevers would reconsider treatment for this.    Plan: No further antibiotics  Watchful waiting as she is clinically improving re: EAEC Supportive care as you are doing    Principal Problem:   SBO (small bowel obstruction) (HCC) Active Problems:   Hypothyroidism   Essential hypertension   Pacemaker   Grade 3 follicular lymphoma of lymph nodes of neck (HCC)   PAD (peripheral artery disease) (HCC)   Malnutrition of moderate degree    enoxaparin (LOVENOX) injection  40 mg Subcutaneous Q24H   gabapentin  300 mg Oral QID   Immune Globulin (Human)  10 g Subcutaneous Q Wed   levothyroxine  100 mcg Oral Q0600   olopatadine  1 drop Both Eyes BID   pantoprazole  40 mg Oral Daily    HPI: [name] is a 86 y.o. female admitted from home with abdominal pain on 12/10/2020.   PMHx significant for AFib s/p PPM, HTN, hypothyroidism, CVID, NHL (in remission after tx 2012).  She follows with immunologist at [facility] closely for her CVID and undergoes weekly Hizentra infusions.   Her daughter is at the bedside and helps the history.   During current hospitalization GI, general surgery have been consulted in addition to hospitalitist management.   EGD on 6/14 for NG placement to assist with non-operative management of SBO (difficult d/t hiatal hernia), tortuous esophagus, normal antrum/duodenum otherwise. With rest and NGT she felt about 50% improved over the first 24 hours or  so. She has been slow to progress with regards to nausea and re-introduction of CLD/FLD. On 6/20 noted to have "explosive diarrhea" following FLD introduction. CT scan with some thickening/inflammatory changes that may be due to underlying CVID.   GI pathogen panel was assessed in the context of her diarrhea and resulted in detection of EAEC on panel. Initially placed on ceftriaxone/flagyl due to trending wbc and low grade temp in light of this.  Has had a few COVID tests checked per advisement of Immunology  team - all of which are negative.   She states that she has had worse diarrhea following her CT scan contrast that seemed to be improving with 1 BM this morning. Her daughter agrees that this was a more formed stool today. She has also discussed with [facility] immunology team who reviewed available records in [place] per her discussion reporting that they have more concern over features of these symptoms being c/w autoimmune effect to gut from CIVD. She has had a similar episode to the current once in the past but otherwise has lower grade chronic bowel/stool symptoms normally attributed to CVID.    Review of Systems: Review of Systems  Constitutional:  Positive for malaise/fatigue and weight loss. Negative for chills and fever.  Respiratory:  Positive for cough. Negative for sputum production and shortness of breath.   Cardiovascular:  Negative for chest pain.  Gastrointestinal:  Positive for abdominal pain (some bloating, improved) and diarrhea. Negative for nausea and vomiting.  Genitourinary:  Negative for dysuria.  Neurological:  Negative for focal weakness.   Past Medical History:  Diagnosis Date   Anemia    pt denies   Arthritis    Atrial fibrillation (HCC)    Cataract    bil   Common variable immunodeficiency (HCC)    Cystocele    history with rectocele, pt denies   GERD (gastroesophageal reflux disease)    H/O hiatal hernia    Hypertension    Hypothyroidism    Non Hodgkin's lymphoma ([place])    dx 2012 and now in remission   Osteoporosis    Pacemaker 09/15/2011   Medtronic Revo   Pericarditis    Pleural effusion, bilateral    history of   Pulmonary nodules    history of   Scoliosis    Varicose vein     Social History   Tobacco Use   Smoking status: Never   Smokeless tobacco: Never  Vaping Use   Vaping Use: Never used  Substance Use Topics   Alcohol use: Yes    Comment: 1 glass of wine occasionally   Drug use: No    Family History  Problem Relation Age  of Onset   Stroke Mother    Stroke Father    Heart disease Daughter        congenital "whole in her heart"   Lymphoma Daughter        nonhodgkins   Breast cancer Sister    Heart disease Brother    Colon cancer Neg Hx    Esophageal cancer Neg Hx    Pancreatic cancer Neg Hx    Stomach cancer Neg Hx    Liver disease Neg Hx    Allergies  Allergen Reactions   Amoxicillin Hives    Has patient had a PCN reaction causing immediate rash, facial/tongue/throat swelling, SOB or lightheadedness with hypotension: No Has patient had a PCN reaction causing severe rash involving mucus membranes or skin necrosis: No Has patient had a PCN reaction that  required hospitalization: No Has patient had a PCN reaction occurring within the last 10 years: No If all of the above answers are "NO", then may proceed with Cephalosporin use.    Buprenorphine Hives   Codeine Hives and Other (See Comments)    "makes her crazy"    Demerol Hives   Morphine And Related Hives   Sulfonamide Derivatives Rash   Vicodin [Hydrocodone-Acetaminophen] Hives    OBJECTIVE: Blood pressure (!) 152/87, pulse 60, temperature 99.1 F (37.3 C), temperature source Oral, resp. rate 18, height 5\' 3"  (1.6 m), weight 63.5 kg, SpO2 95 %.  Physical Exam Vitals reviewed.  Constitutional:      Appearance: She is well-developed.     Comments: Resting quietly in bed. No distress.   Cardiovascular:     Rate and Rhythm: Normal rate and regular rhythm.  Abdominal:     General: There is distension (mild).     Palpations: Abdomen is soft.  Skin:    General: Skin is warm and dry.  Neurological:     Mental Status: She is alert and oriented to person, place, and time.    Lab Results Lab Results  Component Value Date   WBC 8.8 12/18/2020   HGB 11.6 (L) 12/18/2020   HCT 34.1 (L) 12/18/2020   MCV 94.5 12/18/2020   PLT 272 12/18/2020    Lab Results  Component Value Date   CREATININE 0.60 12/18/2020   BUN 5 (L) 12/18/2020   NA  140 12/18/2020   K 3.6 12/18/2020   CL 103 12/18/2020   CO2 28 12/18/2020    Lab Results  Component Value Date   ALT 16 12/10/2020   AST 24 12/10/2020   ALKPHOS 64 12/10/2020   BILITOT 0.6 12/10/2020     Microbiology: Recent Results (from the past 240 hour(s))  Resp Panel by RT-PCR (Flu A&B, Covid) Nasopharyngeal Swab     Status: None   Collection Time: 12/10/20  6:04 AM   Specimen: Nasopharyngeal Swab; Nasopharyngeal(NP) swabs in vial transport medium  Result Value Ref Range Status   SARS Coronavirus 2 by RT PCR NEGATIVE NEGATIVE Final    Comment: (NOTE) SARS-CoV-2 target nucleic acids are NOT DETECTED.  The SARS-CoV-2 RNA is generally detectable in upper respiratory specimens during the acute phase of infection. The lowest concentration of SARS-CoV-2 viral copies this assay can detect is 138 copies/mL. A negative result does not preclude SARS-Cov-2 infection and should not be used as the sole basis for treatment or other patient management decisions. A negative result may occur with  improper specimen collection/handling, submission of specimen other than nasopharyngeal swab, presence of viral mutation(s) within the areas targeted by this assay, and inadequate number of viral copies(<138 copies/mL). A negative result must be combined with clinical observations, patient history, and epidemiological information. The expected result is Negative.  Fact Sheet for Patients:  EntrepreneurPulse.com.au  Fact Sheet for Healthcare Providers:  IncredibleEmployment.be  This test is no t yet approved or cleared by the [place] FDA and  has been authorized for detection and/or diagnosis of SARS-CoV-2 by FDA under an Emergency Use Authorization (EUA). This EUA will remain  in effect (meaning this test can be used) for the duration of the COVID-19 declaration under Section 564(b)(1) of the Act, 21 U.S.C.section 360bbb-3(b)(1), unless the  authorization is terminated  or revoked sooner.       Influenza A by PCR NEGATIVE NEGATIVE Final   Influenza B by PCR NEGATIVE NEGATIVE Final    Comment: (NOTE)  The Xpert Xpress SARS-CoV-2/FLU/RSV plus assay is intended as an aid in the diagnosis of influenza from Nasopharyngeal swab specimens and should not be used as a sole basis for treatment. Nasal washings and aspirates are unacceptable for Xpert Xpress SARS-CoV-2/FLU/RSV testing.  Fact Sheet for Patients: EntrepreneurPulse.com.au  Fact Sheet for Healthcare Providers: IncredibleEmployment.be  This test is not yet approved or cleared by the [place] FDA and has been authorized for detection and/or diagnosis of SARS-CoV-2 by FDA under an Emergency Use Authorization (EUA). This EUA will remain in effect (meaning this test can be used) for the duration of the COVID-19 declaration under Section 564(b)(1) of the Act, 21 U.S.C. section 360bbb-3(b)(1), unless the authorization is terminated or revoked.  Performed at [facility], [address], [place], [place] [address]   Gastrointestinal Panel by PCR , Stool     Status: Abnormal   Collection Time: 12/16/20  5:29 PM   Specimen: Stool  Result Value Ref Range Status   Campylobacter species NOT DETECTED NOT DETECTED Final   Plesimonas shigelloides NOT DETECTED NOT DETECTED Final   Salmonella species NOT DETECTED NOT DETECTED Final   Yersinia enterocolitica NOT DETECTED NOT DETECTED Final   Vibrio species NOT DETECTED NOT DETECTED Final   Vibrio cholerae NOT DETECTED NOT DETECTED Final   Enteroaggregative E coli (EAEC) DETECTED (A) NOT DETECTED Final    Comment: RESULT CALLED TO, READ BACK BY AND VERIFIED WITH: MIRIAM PEETEH 12/17/20 1317 KLW    Enteropathogenic E coli (EPEC) NOT DETECTED NOT DETECTED Final   Enterotoxigenic E coli (ETEC) NOT DETECTED NOT DETECTED Final   Shiga like toxin producing E coli (STEC) NOT  DETECTED NOT DETECTED Final   Shigella/Enteroinvasive E coli (EIEC) NOT DETECTED NOT DETECTED Final   Cryptosporidium NOT DETECTED NOT DETECTED Final   Cyclospora cayetanensis NOT DETECTED NOT DETECTED Final   Entamoeba histolytica NOT DETECTED NOT DETECTED Final   Giardia lamblia NOT DETECTED NOT DETECTED Final   Adenovirus F40/41 NOT DETECTED NOT DETECTED Final   Astrovirus NOT DETECTED NOT DETECTED Final   Norovirus GI/GII NOT DETECTED NOT DETECTED Final   Rotavirus A NOT DETECTED NOT DETECTED Final   Sapovirus (I, II, IV, and V) NOT DETECTED NOT DETECTED Final    Comment: Performed at [facility], [address] [place], [place] [address]  Resp Panel by RT-PCR (Flu A&B, Covid) Nasopharyngeal Swab     Status: None   Collection Time: 12/17/20  6:26 PM   Specimen: Nasopharyngeal Swab; Nasopharyngeal(NP) swabs in vial transport medium  Result Value Ref Range Status   SARS Coronavirus 2 by RT PCR NEGATIVE NEGATIVE Final    Comment: (NOTE) SARS-CoV-2 target nucleic acids are NOT DETECTED.  The SARS-CoV-2 RNA is generally detectable in upper respiratory specimens during the acute phase of infection. The lowest concentration of SARS-CoV-2 viral copies this assay can detect is 138 copies/mL. A negative result does not preclude SARS-Cov-2 infection and should not be used as the sole basis for treatment or other patient management decisions. A negative result may occur with  improper specimen collection/handling, submission of specimen other than nasopharyngeal swab, presence of viral mutation(s) within the areas targeted by this assay, and inadequate number of viral copies(<138 copies/mL). A negative result must be combined with clinical observations, patient history, and epidemiological information. The expected result is Negative.  Fact Sheet for Patients:  EntrepreneurPulse.com.au  Fact Sheet for Healthcare Providers:   IncredibleEmployment.be  This test is no t yet approved or cleared by the  [place] Architectural technologist and  has been authorized for detection and/or diagnosis of SARS-CoV-2 by FDA under an Print production planner (EUA). This EUA will remain  in effect (meaning this test can be used) for the duration of the COVID-19 declaration under Section 564(b)(1) of the Act, 21 U.S.C.section 360bbb-3(b)(1), unless the authorization is terminated  or revoked sooner.       Influenza A by PCR NEGATIVE NEGATIVE Final   Influenza B by PCR NEGATIVE NEGATIVE Final    Comment: (NOTE) The Xpert Xpress SARS-CoV-2/FLU/RSV plus assay is intended as an aid in the diagnosis of influenza from Nasopharyngeal swab specimens and should not be used as a sole basis for treatment. Nasal washings and aspirates are unacceptable for Xpert Xpress SARS-CoV-2/FLU/RSV testing.  Fact Sheet for Patients: EntrepreneurPulse.com.au  Fact Sheet for Healthcare Providers: IncredibleEmployment.be  This test is not yet approved or cleared by the [place] FDA and has been authorized for detection and/or diagnosis of SARS-CoV-2 by FDA under an Emergency Use Authorization (EUA). This EUA will remain in effect (meaning this test can be used) for the duration of the COVID-19 declaration under Section 564(b)(1) of the Act, 21 U.S.C. section 360bbb-3(b)(1), unless the authorization is terminated or revoked.  Performed at [facility], [place] [address], [place], [place] [address]     Janene Madeira, MSN, NP-C [facility] [place] Cell: [phone] Pager: [phone]  12/18/2020 1:01 PM

## 2020-12-18 NOTE — Progress Notes (Signed)
PROGRESS NOTE    [name]  LGX:[id number] DOB: 1935/03/15 DOA: 12/10/2020 PCP: Janith Lima, MD    Brief Narrative:  [name] was admitted to the hospital with the working diagnosis of small bowel obstruction.   86 yo female with the past medical history of atrial fibrillation, HTN, hypothyroidism, non hodgkin's lymphoma on remission and peripheral vascular disease who presented with abdominal pain. Reported abdominal pain, worsening over the night prior to hospitalization. Positive nausea and vomiting that prompted her to come to the hospital.  On her initial physical examination blood pressure 118/79, heart rate 62, respiratory 16, temperature 98.1, oxygen saturation 96%.  Her lungs are clear to auscultation bilaterally, heart S1-S2, present, rhythmic, abdomen diffusely tender to palpation, positive distention, no lower extremity edema.  CT of the abdomen and pelvis with small bowel obstruction secondary to internal herniation of the distal ileum.  Large hiatal hernia.  Positive diverticulosis.  Patient received intravenous fluids, NG tube was placed for decompression with good toleration. Her symptoms slowly improved, her nasogastric tube was removed 6/18.  Her diet was advanced slowly with good toleration.  She developed persistent abdominal pain and diarrhea, stool tested positive for E. coli.  No antibiotic therapy indicated.  Assessment & Plan:   Principal Problem:   SBO (small bowel obstruction) (HCC) Active Problems:   Hypothyroidism   Essential hypertension   Pacemaker   Grade 3 follicular lymphoma of lymph nodes of neck (HCC)   PAD (peripheral artery disease) (HCC)   Malnutrition of moderate degree   Small bowel obstruction. E coli enteritis Patient has been recovering slowly, currently continue to pass gas and positive bowel movement this am,  No nausea or vomiting and no significant abdominal pain.   No current indication for antibiotic therapy.  Continue supportive care with antiacids, analgesics and as needed antiemetics.  Currently on full liquid diet.   2. Hypokalemia and hypomagnesemia. Stable renal function with serum cr at 0,60, K is 3,6 and bicarbonate at 28. Mg is 2,0  Add 40 meq Kcl po x1 and follow up on renal function in am.  Hold on IV fluids for now.   3. Chronic bronchitis with bilateral atelectasis. Will continue oxymetry monitoring, airway clearing techniques with flutter valve and incentive spirometer.  Add bronchodilator therapy with duoneb.   4. HTN/ PAD continue blood pressure monitoring, currently holding on antihypertensive medications to prevent hypotension   5. Atrial fibrillation/ sp pacer Continue rate control. Patient is not on anticoagulation.   6. Hypothyroid. Continue with levothyroxine      Status is: Inpatient  Remains inpatient appropriate because:IV treatments appropriate due to intensity of illness or inability to take PO  Dispo: The patient is from: Home              Anticipated d/c is to: Home              Patient currently is not medically stable to d/c.   Difficult to place patient No   DVT prophylaxis: Enoxaparin   Code Status:   full  Family Communication:  I spoke with patient's daughter at the bedside, we talked in detail about patient's condition, plan of care and prognosis and all questions were addressed.   Consultants:  Surgery  ID   Subjective: Patient is out of bed to chair, no nausea or vomiting, tolerating po well, positive bowel movement and flatus.  Positive cough but not dyspnea.   Objective: Vitals:   12/17/20 1752 12/17/20 2327 12/18/20 8144  12/18/20 1203  BP: 129/71 (!) 131/92 135/71 (!) 152/87  Pulse: 83 67 62 60  Resp: 18 20 17 18   Temp: 98.7 F (37.1 C) 99 F (37.2 C) 98.4 F (36.9 C) 99.1 F (37.3 C)  TempSrc: Oral Oral Oral Oral  SpO2: 94% 99% 90% 95%  Weight:      Height:        Intake/Output Summary (Last 24 hours) at 12/18/2020  1526 Last data filed at 12/18/2020 0400 Gross per 24 hour  Intake 1455.03 ml  Output --  Net 1455.03 ml   Filed Weights   12/10/20 0259  Weight: 63.5 kg    Examination:   General: Not in pain or dyspnea deconditioned  Neurology: Awake and alert, non focal  E ENT: no pallor, no icterus, oral mucosa moist Cardiovascular: No JVD. S1-S2 present, rhythmic, no gallops, rubs, or murmurs. No lower extremity edema. Pulmonary: positive breath sounds bilaterally with no wheezing, rhonchi bibasilar bilateral rales. Gastrointestinal. Abdomen soft and non tender Skin. No rashes Musculoskeletal: no joint deformities     Data Reviewed: I have personally reviewed following labs and imaging studies  CBC: Recent Labs  Lab 12/12/20 0515 12/14/20 0619 12/16/20 0928 12/18/20 0458  WBC 12.7* 9.8 7.8 8.8  NEUTROABS  --   --   --  5.3  HGB 13.3 13.0 13.5 11.6*  HCT 39.8 39.8 39.9 34.1*  MCV 95.7 97.3 92.8 94.5  PLT 200 225 250 742   Basic Metabolic Panel: Recent Labs  Lab 12/12/20 0515 12/14/20 0619 12/16/20 0928 12/18/20 0458  NA 137 137 140 140  K 3.5 4.0 3.1* 3.6  CL 99 107 108 103  CO2 26 19* 27 28  GLUCOSE 80 79 119* 104*  BUN 15 9 9  5*  CREATININE 0.74 0.75 0.48 0.60  CALCIUM 8.8* 8.4* 8.3* 7.9*  MG 1.9 1.9 1.6* 2.0   GFR: Estimated Creatinine Clearance: 46.9 mL/min (by C-G formula based on SCr of 0.6 mg/dL). Liver Function Tests: No results for input(s): AST, ALT, ALKPHOS, BILITOT, PROT, ALBUMIN in the last 168 hours. No results for input(s): LIPASE, AMYLASE in the last 168 hours. No results for input(s): AMMONIA in the last 168 hours. Coagulation Profile: No results for input(s): INR, PROTIME in the last 168 hours. Cardiac Enzymes: No results for input(s): CKTOTAL, CKMB, CKMBINDEX, TROPONINI in the last 168 hours. BNP (last 3 results) No results for input(s): PROBNP in the last 8760 hours. HbA1C: No results for input(s): HGBA1C in the last 72 hours. CBG: No  results for input(s): GLUCAP in the last 168 hours. Lipid Profile: No results for input(s): CHOL, HDL, LDLCALC, TRIG, CHOLHDL, LDLDIRECT in the last 72 hours. Thyroid Function Tests: No results for input(s): TSH, T4TOTAL, FREET4, T3FREE, THYROIDAB in the last 72 hours. Anemia Panel: No results for input(s): VITAMINB12, FOLATE, FERRITIN, TIBC, IRON, RETICCTPCT in the last 72 hours.    Radiology Studies: I have reviewed all of the imaging during this hospital visit personally     Scheduled Meds:  enoxaparin (LOVENOX) injection  40 mg Subcutaneous Q24H   gabapentin  300 mg Oral QID   Immune Globulin (Human)  10 g Subcutaneous Q Wed   levothyroxine  100 mcg Oral Q0600   olopatadine  1 drop Both Eyes BID   pantoprazole  40 mg Oral Daily   Continuous Infusions:  dextrose 5% lactated ringers with KCl 20 mEq/L 75 mL/hr at 12/18/20 1220   lactated ringers     methocarbamol (ROBAXIN) IV  promethazine (PHENERGAN) injection (IM or IVPB) 200 mL/hr at 12/16/20 1822     LOS: 8 days        [name] Gerome Apley, MD

## 2020-12-18 NOTE — Progress Notes (Signed)
Physical Therapy Treatment Patient Details Name: [name] MRN: [id number] DOB: [birth year] Today's Date: 12/18/2020    History of Present Illness 86 y.o. female presenting to ED 12/10/20 with abdominal pain, nausea and vomiting. CT significant for SBO. NG tube placement made difficult by large hiatal hernia.  PMH: afib;  HTN; hypothyroidism; pacemaker placement; PAD; CVID; and NHL in remission    PT Comments    Pt supine in bed on entry with complaints of hunger. Pt request use of bathroom prior to ambulation in hallway. Pt able to perform lower body bathing in standing after using the toilet. Pt is mod I for all mobility. After ambulation educated pt in proper use of IS and encouraged use hourly as well as sitting up in chair for improved breathing. D/c plans remain appropriate at this time. PT will continue to follow acutely.   Follow Up Recommendations  Home health PT;Supervision - Intermittent     Equipment Recommendations  None recommended by PT (has necessary equipment)       Precautions / Restrictions Precautions Precautions: Other (comment) Restrictions Weight Bearing Restrictions: No    Mobility  Bed Mobility Overal bed mobility: Modified Independent             General bed mobility comments: HoB elevated    Transfers Overall transfer level: Modified independent               General transfer comment: increased effort from bed and toilet  Ambulation/Gait Ambulation/Gait assistance: Modified independent (Device/Increase time) Gait Distance (Feet): 150 Feet Assistive device: IV Pole Gait Pattern/deviations: Step-through pattern;Trunk flexed Gait velocity: slower than baseline Gait velocity interpretation: >2.62 ft/sec, indicative of community ambulatory General Gait Details: relies on IV pole for support         Balance Overall balance assessment: Modified Independent                                          Cognition  Arousal/Alertness: Awake/alert Behavior During Therapy: Flat affect (pain limited) Overall Cognitive Status: Within Functional Limits for tasks assessed                                        Exercises Other Exercises Other Exercises: IS x 5 max inhalation noted 874mL    General Comments General comments (skin integrity, edema, etc.): VSS on RA      Pertinent Vitals/Pain Pain Assessment: Faces Faces Pain Scale: Hurts little more Pain Location: generalized Pain Descriptors / Indicators: Grimacing;Discomfort Pain Intervention(s): Limited activity within patient's tolerance;Monitored during session;Repositioned     PT Goals (current goals can now be found in the care plan section) Acute Rehab PT Goals Patient Stated Goal: feel better PT Goal Formulation: With patient/family Time For Goal Achievement: 12/30/20 Potential to Achieve Goals: Good    Frequency    Min 3X/week      PT Plan Current plan remains appropriate       AM-PAC PT "6 Clicks" Mobility   Outcome Measure  Help needed turning from your back to your side while in a flat bed without using bedrails?: None Help needed moving from lying on your back to sitting on the side of a flat bed without using bedrails?: A Little Help needed moving to and from a bed to a chair (including a  wheelchair)?: A Little Help needed standing up from a chair using your arms (e.g., wheelchair or bedside chair)?: A Little Help needed to walk in hospital room?: A Little Help needed climbing 3-5 steps with a railing? : A Lot 6 Click Score: 18    End of Session   Activity Tolerance: Patient tolerated treatment well Patient left: with family/visitor present;with call bell/phone within reach;in chair   PT Visit Diagnosis: Muscle weakness (generalized) (M62.81)     Time: 7014-1030 PT Time Calculation (min) (ACUTE ONLY): 18 min  Charges:  $Therapeutic Exercise: 8-22 mins                     Geovanni Rahming B. Migdalia Dk PT, DPT Acute Rehabilitation Services Pager [phone] Office [phone]    [place] 12/18/2020, 1:25 PM

## 2020-12-18 NOTE — TOC Initial Note (Signed)
Transition of Care [facility]) - Initial/Assessment Note    Patient Details  Name: [name] MRN: [id number] Date of Birth: [birth year]  [facility] CM/SW Contact:    Marilu Favre, RN Phone Number: 12/18/2020, 3:28 PM  Clinical Narrative:                 Spoke to patient at bedside. Discussed PT recommendation. Patient does not feel that she needs HHPT at this time. If she changes her mind she will let the nurse know.   Expected Discharge Plan: [place]     Patient Goals and CMS Choice Patient states their goals for this hospitalization and ongoing recovery are:: to return tohome CMS Medicare.gov Compare Post Acute Care list provided to:: Patient Choice offered to / list presented to : Patient  Expected Discharge Plan and Services Expected Discharge Plan: [place]   Discharge Planning Services: CM Consult Post Acute Care Choice: [place] arrangements for the past 2 months: Single Family Home                 DME Arranged: N/A DME Agency: NA       HH Arranged: PT          Prior Living Arrangements/Services Living arrangements for the past 2 months: Single Family Home   Patient language and need for interpreter reviewed:: Yes Do you feel safe going back to the place where you live?: Yes      Need for Family Participation in Patient Care: Yes (Comment) Care giver support system in place?: Yes (comment) Current home services: DME Criminal Activity/Legal Involvement Pertinent to Current Situation/Hospitalization: No - Comment as needed  Activities of Daily Living Home Assistive Devices/Equipment: None ADL Screening (condition at time of admission) Patient's cognitive ability adequate to safely complete daily activities?: Yes Is the patient deaf or have difficulty hearing?: No Does the patient have difficulty seeing, even when wearing glasses/contacts?: No Does the patient have difficulty  concentrating, remembering, or making decisions?: No Patient able to express need for assistance with ADLs?: No Does the patient have difficulty dressing or bathing?: No Independently performs ADLs?: Yes (appropriate for developmental age) Does the patient have difficulty walking or climbing stairs?: Yes Weakness of Legs: Both Weakness of Arms/Hands: None  Permission Sought/Granted   Permission granted to share information with : No              Emotional Assessment Appearance:: Appears stated age Attitude/Demeanor/Rapport: Engaged Affect (typically observed): Accepting Orientation: : Oriented to Self, Oriented to Place, Oriented to  Time, Oriented to Situation Alcohol / Substance Use: Not Applicable Psych Involvement: No (comment)  Admission diagnosis:  Small bowel obstruction (HCC) [K56.609] SBO (small bowel obstruction) (HCC) [K56.609] Generalized abdominal pain [R10.84] Encounter for nasogastric (NG) tube placement [Z46.59] Encounter for imaging study to confirm nasogastric (NG) tube placement [Z01.89] Patient Active Problem List   Diagnosis Date Noted   Malnutrition of moderate degree 12/17/2020   SBO (small bowel obstruction) ([place]) 12/10/2020   PAD (peripheral artery disease) ([place]) 12/10/2020   Atherosclerosis of native arteries of extremity with intermittent claudication ([place]) 06/25/2020   Tear of meniscus of knee 05/29/2020   Primary osteoarthritis of left knee 09/22/2019   Postoperative atrial fibrillation ([place]) 01/14/2018   Vasovagal syncope 04/09/2017   Right shoulder injury, initial encounter 02/25/2017   Hypertropia of left eye 09/09/2016   Monocular esotropia of left eye 09/09/2016   Chronic diastolic heart failure ([place])  05/29/2016   History of pericarditis 05/29/2016   Sinus node dysfunction (HCC) 02/05/2016   Insomnia 06/19/4113   Grade 3 follicular lymphoma of lymph nodes of neck ([place]) 06/06/2015   Peripheral neuropathy 12/26/2014   Heart palpitations  06/04/2014   Cyst in hand 03/14/2014   Hearing loss 04/27/2013   Hip pain 04/27/2013   Need for prophylactic vaccination and inoculation against influenza 04/27/2013   Multinodular goiter 02/21/2013   Pacemaker 02/11/2013   Pericardial effusion after pacemaker, hospitalized at [facility] 09/25/11 09/29/2011   Dyslipidemia, LDL 136 08/21/2011   Hiatal hernia, large by CXR 08/21/2011   Scoliosis 08/21/2011   NHL (non-Hodgkin's lymphoma)-recent diagnosis 07/09/2011   Anxiety 07/09/2011   Sinus bradycardia, symptomatic, s/p MDT PTVDP 09/15/11 02/11/2011   Essential hypertension 01/26/2011   Osteoporosis 64/31/4276   DIASTOLIC DYSFUNCTION, on 2D Jan 2011 (grade 1) 08/13/2009   PAP SMEAR, ABNORMAL 07/04/2009   BACK PAIN 12/07/2008   Hypothyroidism 10/30/2008   ADJUSTMENT DISORDER WITH DEPRESSED MOOD 10/30/2008   GERD 10/30/2008   PCP:  Janith Lima, MD Pharmacy:   [facility], [place] [address] [place] [address] Phone: [phone] Fax: [facility] [address], [place] [address] Phone: [phone] Fax: [phone]     Social Determinants of Health (SDOH) Interventions    Readmission Risk Interventions No flowsheet data found.

## 2020-12-19 DIAGNOSIS — C8221 Follicular lymphoma grade III, unspecified, lymph nodes of head, face, and neck: Secondary | ICD-10-CM

## 2020-12-19 LAB — BASIC METABOLIC PANEL
Anion gap: 9 (ref 5–15)
BUN: <5 mg/dL — ABNORMAL LOW (ref 8–23)
CO2: 25 mmol/L (ref 22–32)
Calcium: 8.4 mg/dL — ABNORMAL LOW (ref 8.9–10.3)
Chloride: 104 mmol/L (ref 98–111)
Creatinine, Ser: 0.47 mg/dL (ref 0.44–1.00)
GFR, Estimated: >60 mL/min (ref >60)
Glucose, Bld: 103 mg/dL — ABNORMAL HIGH (ref 70–99)
Potassium: 3.8 mmol/L (ref 3.5–5.1)
Sodium: 138 mmol/L (ref 135–145)

## 2020-12-19 LAB — MAGNESIUM: Magnesium: 1.7 mg/dL (ref 1.7–2.4)

## 2020-12-19 MED ORDER — ONDANSETRON 4 MG PO TBDP
4.0000 mg | ORAL_TABLET | Freq: Four times a day (QID) | ORAL | 0 refills | Status: DC | PRN
Start: 2020-12-19 — End: 2021-06-03

## 2020-12-19 MED ORDER — MAGNESIUM SULFATE 2 GM/50ML IV SOLN
2.0000 g | Freq: Once | INTRAVENOUS | Status: AC
  Administered 2020-12-19: 2 g via INTRAVENOUS
  Filled 2020-12-19: qty 50

## 2020-12-19 MED ORDER — MENTHOL 3 MG MT LOZG
1.0000 | LOZENGE | OROMUCOSAL | Status: DC | PRN
  Administered 2020-12-19: 3 mg via ORAL
  Filled 2020-12-19: qty 9

## 2020-12-19 MED ORDER — METOPROLOL TARTRATE 25 MG PO TABS: 12.5000 mg | ORAL_TABLET | Freq: Two times a day (BID) | ORAL | Status: DC

## 2020-12-19 MED ORDER — POTASSIUM CHLORIDE CRYS ER 20 MEQ PO TBCR: 20.0000 meq | EXTENDED_RELEASE_TABLET | Freq: Once | ORAL | Status: DC

## 2020-12-19 MED ORDER — POTASSIUM CHLORIDE CRYS ER 20 MEQ PO TBCR
40.0000 meq | EXTENDED_RELEASE_TABLET | Freq: Once | ORAL | Status: AC
  Administered 2020-12-19: 40 meq via ORAL
  Filled 2020-12-19: qty 2

## 2020-12-19 NOTE — Progress Notes (Signed)
[facility] notified SVT 150 hr bp 156/85 temp 99.4, 93% RA SOB no chest pain or light headiness palpitations were felt.awaiting MD response will continue to monitor.

## 2020-12-19 NOTE — Evaluation (Signed)
Occupational Therapy Evaluation Patient Details Name: [name] MRN: [id number] DOB: 11/23/1934 Today's Date: 12/19/2020    History of Present Illness 86 y.o. female presenting to ED 12/10/20 with abdominal pain, nausea and vomiting. CT significant for SBO. NG tube placement made difficult by large hiatal hernia.  PMH: afib;  HTN; hypothyroidism; pacemaker placement; PAD; CVID; and NHL in remission   Clinical Impression   PTA, pt was independent and living alone; works in office. Pt performing ADLs with mod I for increased time. VSS on RA during bed mobility, toileting, and hand hygiene. Noting additional effort required during functional mobility. Pt daughter present and available to help after discharge. Recommend discharge home with no follow up OT. Will continue to follow acutely to provide education on energy conservation during ADLs and IADLs.    Follow Up Recommendations  No OT follow up    Equipment Recommendations  None recommended by OT    Recommendations for Other Services       Precautions / Restrictions Restrictions Weight Bearing Restrictions: No      Mobility Bed Mobility Overal bed mobility: Modified Independent             General bed mobility comments: HoB elevated    Transfers Overall transfer level: Modified independent               General transfer comment: increased effort from bed and toilet    Balance Overall balance assessment: Modified Independent                                         ADL either performed or assessed with clinical judgement   ADL Overall ADL's : Modified independent                                       General ADL Comments: Pt performing ADLs with mod I. Performing toileting and hand washing during session with mod I. VSS on RA.     Vision   Vision Assessment?: No apparent visual deficits     Perception Perception Perception Tested?: No Comments: no apparent  difficulty with perception.   Praxis Praxis Praxis tested?: Not tested Praxis-Other Comments: No apparent motor planning difficulties    Pertinent Vitals/Pain Pain Assessment: Faces Faces Pain Scale: Hurts a little bit Pain Location: generalized Pain Descriptors / Indicators: Discomfort Pain Intervention(s): Limited activity within patient's tolerance;Monitored during session     Hand Dominance     Extremity/Trunk Assessment Upper Extremity Assessment Upper Extremity Assessment: Overall WFL for tasks assessed   Lower Extremity Assessment Lower Extremity Assessment: Defer to PT evaluation   Cervical / Trunk Assessment Cervical / Trunk Assessment: Other exceptions Cervical / Trunk Exceptions: scoliosis   Communication Communication Communication: No difficulties   Cognition Arousal/Alertness: Awake/alert Behavior During Therapy: WFL for tasks assessed/performed Overall Cognitive Status: Within Functional Limits for tasks assessed                                 General Comments: Pt not wanting to get OOB due to being tired, but compliant. Pt repeating consistently throughout session that she did not get much sleep last night and has not been sleeping, and is significantly fatigued.   General Comments  VSS on RA.  Pt daughter present. Pt and daughter with increased concern for rest after tachycardic episode this morning. Pt with nose bleed while using restroom. Provided wash cloth and nose bleed resolved. RN notified.    Exercises     Shoulder Instructions      Home Living Family/patient expects to be discharged to:: Private residence Living Arrangements: Alone Available Help at Discharge: Family;Available 24 hours/day Type of Home: House Home Access: Stairs to enter [employer] of Steps: 2 Entrance Stairs-Rails: Can reach both Home Layout: Two level;Bed/bath upstairs (chair lift) Alternate Level Stairs-Number of Steps: 9 Alternate Level  Stairs-Rails: Can reach both Bathroom Shower/Tub: Teacher, early years/pre: Handicapped height Bathroom Accessibility: Yes   Home Equipment: Environmental consultant - 2 wheels;Bedside commode;Grab bars - toilet;Grab bars - tub/shower;Wheelchair - manual   Additional Comments: chair lift upstairs.      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        OT Problem List: Decreased activity tolerance;Cardiopulmonary status limiting activity;Pain      OT Treatment/Interventions: Self-care/ADL training;Therapeutic exercise;Therapeutic activities;Patient/family education    OT Goals(Current goals can be found in the care plan section) Acute Rehab OT Goals Patient Stated Goal: feel better OT Goal Formulation: With patient/family Time For Goal Achievement: 01/02/21 Potential to Achieve Goals: Good ADL Goals Additional ADL Goal #1: Pt will independently verbalize 3 energy conservation techniques for ADLs and IADLs.  OT Frequency: Min 2X/week   Barriers to D/C:            Co-evaluation              AM-PAC OT "6 Clicks" Daily Activity     Outcome Measure Help from another person eating meals?: None Help from another person taking care of personal grooming?: None Help from another person toileting, which includes using toliet, bedpan, or urinal?: None Help from another person bathing (including washing, rinsing, drying)?: None Help from another person to put on and taking off regular upper body clothing?: None Help from another person to put on and taking off regular lower body clothing?: None 6 Click Score: 24   End of Session Equipment Utilized During Treatment: Gait belt Nurse Communication: Mobility status  Activity Tolerance: Patient tolerated treatment well Patient left: in bed;with call bell/phone within reach;with family/visitor  present  OT Visit Diagnosis: Muscle weakness (generalized) (M62.81);Pain Pain - part of body:  (generalized)                Time: 9179-1505 OT Time Calculation (min): 16 min Charges:  OT General Charges $OT Visit: 1 Visit OT Evaluation $OT Eval Low Complexity: 1 Low  Shanda Howells, OTDS   Shanda Howells 12/19/2020, 9:57 AM

## 2020-12-19 NOTE — Progress Notes (Signed)
[facility] night shift.  The nursing staff reported that the patient had a symptomatic nonsustained episode of SVT.  She felt palpitations and mild dyspnea while tachycardic.  She takes metoprolol 25 mg p.o. twice daily.  She is on levothyroxine 100 mcg p.o. daily and in the past TSH levels have have been low.  I will supplement potassium and add on magnesium level to the morning labs.  Consider adding TSH level if magnesium level is normal or SVT episodes recur.  Tennis Must, MD.

## 2020-12-19 NOTE — Plan of Care (Signed)
  Problem: Education: Goal: Knowledge of General Education information will improve Description: Including pain rating scale, medication(s)/side effects and non-pharmacologic comfort measures Outcome: Completed/Met   Problem: Health Behavior/Discharge Planning: Goal: Ability to manage health-related needs will improve Outcome: Completed/Met   Problem: Clinical Measurements: Goal: Ability to maintain clinical measurements within normal limits will improve Outcome: Progressing Goal: Will remain free from infection Outcome: Progressing Goal: Diagnostic test results will improve Outcome: Progressing Goal: Respiratory complications will improve Outcome: Progressing Goal: Cardiovascular complication will be avoided Outcome: Progressing   Problem: Activity: Goal: Risk for activity intolerance will decrease Outcome: Progressing   Problem: Nutrition: Goal: Adequate nutrition will be maintained Outcome: Progressing   Problem: Elimination: Goal: Will not experience complications related to bowel motility Outcome: Progressing Goal: Will not experience complications related to urinary retention Outcome: Progressing   Problem: Pain Managment: Goal: General experience of comfort will improve Outcome: Completed/Met   Problem: Safety: Goal: Ability to remain free from injury will improve Outcome: Progressing   Problem: Skin Integrity: Goal: Risk for impaired skin integrity will decrease Outcome: Progressing

## 2020-12-19 NOTE — Progress Notes (Signed)
Place on 2liters O2 new orders received. Will continue to monitor. Arthor Captain LPN

## 2020-12-19 NOTE — Discharge Summary (Signed)
Physician Discharge Summary  [name] HYQ:[id number] DOB: 03-24-1935 DOA: 12/10/2020  PCP: Janith Lima, MD  Admit date: 12/10/2020 Discharge date: 12/19/2020  Admitted From: Home  Disposition:  Home   Recommendations for Outpatient Follow-up and new medication changes:  Follow up with Dr. Marcello Moores in 7 to 10 days.  Follow up with Surgery as outpatient as scheduled.   Home Health:  yes  Equipment/Devices: na    Discharge Condition: stable  CODE STATUS: full  Diet recommendation:  soft and advance as tolerated.   Brief/Interim Summary: [name] was admitted to the hospital with the working diagnosis of small bowel obstruction.   86 yo female with the past medical history of atrial fibrillation, HTN, hypothyroidism, non hodgkin's lymphoma on remission and peripheral vascular disease who presented with abdominal pain. Reported abdominal pain, worsening over the night prior to hospitalization. Positive nausea and vomiting that prompted her to come to the hospital.  On her initial physical examination blood pressure 118/79, heart rate 62, respiratory rate 16, temperature 98.1, oxygen saturation 96%.  Her lungs were clear to auscultation bilaterally, heart S1-S2, present, rhythmic, abdomen diffusely tender to palpation, positive distention, no lower extremity edema.  Sodium 141, potassium 3.6, chloride 104, bicarb 23, glucose 145, BUN 14, creatinine 0.73, white count 16.3, hemoglobin 14.7, hematocrit 42.6, platelets 248. SARS COVID-19 negative.  EKG 63 bpm, left axis deviation, normal intervals, atrial paced, V sensed, no significant ST segment or T wave changes.  CT of the abdomen and pelvis with small bowel obstruction secondary to internal herniation of the distal ileum.  Large hiatal hernia.  Positive diverticulosis.   Patient received intravenous fluids, NG tube was placed for decompression with good toleration. Her symptoms slowly improved, her nasogastric tube was removed  6/18.  Her diet was advanced slowly with good toleration.   She developed persistent abdominal pain and diarrhea, stool tested positive for E. coli.   No antibiotic therapy indicated  She continue to improve with no further nausea or vomiting, no abdominal pain. Plan to follow up as outpatient.   Small bowel obstruction, E. coli enteritis.  Patient was admitted to the medical ward, she was placed on a remote telemetry monitor. Received medical supportive therapy with intravenous fluids, antiacids, as needed analgesics and antiemetics.  Further work-up with gastrointestinal panel was positive for E. coli. No clinical indication for antibiotic therapy.  Patient with no dysenteria.  Her symptoms slowly improved, nasogastric tube was removed and her diet was advanced with good toleration.  2.  Hypokalemia and hypomagnesemia/moderate calorie protein malnutrition.  Her renal function remained stable, patient received potassium chloride and magnesium sulfate for electrolyte correction.  At her discharge sodium 138, potassium 3.8, chloride 104, bicarb 25, glucose 103, BUN less than 5, creatinine 0.47, magnesium 1.7.  Continue with nutritional supplements.  3.  Chronic bronchitis with bibasilar atelectasis.  No signs of pulmonary infection, patient was placed on as needed bronchodilator therapy and aggressive airway clearance techniques with incentive spirometry and flutter valve. Her oxygenation discharge is 93% on room air.  4.  Hypertension/peripheral artery disease.  Her antihypertensive agents were held during her hospitalization to prevent hypotension. At discharge will resume lisinopril and metoprolol.   5.  Paroxysmal atrial fibrillation, status post pacer.  Heart rate remains well controlled, patient currently not on anticoagulation. Continue with metoprolol.   6.  Hypothyroid.  Continue levothyroxine.  7.  History of non-Hodgkin's lymphoma, common variable immunodeficiency. Iron  deficiency anemia Patient to follow-up as an outpatient.  Continue with oral iron supplementation.   Discharge Diagnoses:  Principal Problem:   SBO (small bowel obstruction) (HCC) Active Problems:   Hypothyroidism   Essential hypertension   Pacemaker   Grade 3 follicular lymphoma of lymph nodes of neck (HCC)   PAD (peripheral artery disease) (HCC)   Malnutrition of moderate degree   CVID (common variable immunodeficiency) ([place])   Diarrhea    Discharge Instructions   Allergies as of 12/19/2020       Reactions   Amoxicillin Hives   Has patient had a PCN reaction causing immediate rash, facial/tongue/throat swelling, SOB or lightheadedness with hypotension: No Has patient had a PCN reaction causing severe rash involving mucus membranes or skin necrosis: No Has patient had a PCN reaction that required hospitalization: No Has patient had a PCN reaction occurring within the last 10 years: No If all of the above answers are "NO", then may proceed with Cephalosporin use.   Buprenorphine Hives   Codeine Hives, Other (See Comments)   "makes her crazy"   Demerol Hives   Dextromethorphan-guaifenesin    insomnia   Morphine And Related Hives   Sulfonamide Derivatives Rash   Vicodin [hydrocodone-acetaminophen] Hives        Medication List     TAKE these medications    acetaminophen 500 MG tablet Commonly known as: TYLENOL Take 500 mg by mouth every 6 (six) hours as needed (for pain.).   Calcium Carbonate-Vitamin D 600-400 MG-UNIT tablet Take 1 tablet by mouth 2 (two) times daily.   denosumab 60 MG/ML Soln injection Commonly known as: PROLIA Inject 60 mg into the skin every 6 (six) months. Reported on 10/16/2015   EPINEPHrine 0.3 mg/0.3 mL Soaj injection Commonly known as: EPI-PEN Inject 0.3 mg into the muscle as needed for anaphylaxis.   ferrous fumarate 325 (106 Fe) MG Tabs tablet Commonly known as: HEMOCYTE - 106 mg FE Take 1 tablet by mouth daily.   gabapentin 300  MG capsule Commonly known as: NEURONTIN Take 1 capsule (300 mg total) by mouth 4 (four) times daily.   Hizentra 4 GM/20ML Soln Generic drug: Immune Globulin (Human) Inject 8 g into the skin once a week.   levothyroxine 100 MCG tablet Commonly known as: SYNTHROID Take 1 tablet (100 mcg total) by mouth daily.   lisinopril 20 MG tablet Commonly known as: ZESTRIL Take 1 tablet (20 mg total) by mouth in the morning and at bedtime.   MAG-OX 400 PO Take 1 tablet by mouth daily.   metoprolol tartrate 25 MG tablet Commonly known as: LOPRESSOR Take 0.5 tablets (12.5 mg total) by mouth 2 (two) times daily. What changed: See the new instructions.   olopatadine 0.1 % ophthalmic solution Commonly known as: PATANOL Place 1 drop into both eyes 2 times daily.   omeprazole 40 MG capsule Commonly known as: PRILOSEC TAKE 1 CAPSULE(40 MG) BY MOUTH DAILY What changed: See the new instructions.   ondansetron 4 MG disintegrating tablet Commonly known as: ZOFRAN-ODT Take 1 tablet (4 mg total) by mouth every 6 (six) hours as needed for nausea.   potassium chloride 10 MEQ tablet Commonly known as: KLOR-CON TAKE 1 TABLET(10 MEQ) BY MOUTH DAILY What changed: See the new instructions.        Follow-up [facility] Follow up.   Specialty: General Surgery Why: Please follow up as needed and call with any questions or concerns Contact information: [address] [place] [phone]  Janith Lima, MD Follow up in 1 week(s).   Specialty: Internal Medicine Contact information: [place] [address] [phone]                Allergies  Allergen Reactions   Amoxicillin Hives    Has patient had a PCN reaction causing immediate rash, facial/tongue/throat swelling, SOB or lightheadedness with hypotension: No Has patient had a PCN reaction causing severe rash involving mucus  membranes or skin necrosis: No Has patient had a PCN reaction that required hospitalization: No Has patient had a PCN reaction occurring within the last 10 years: No If all of the above answers are "NO", then may proceed with Cephalosporin use.    Buprenorphine Hives   Codeine Hives and Other (See Comments)    "makes her crazy"    Demerol Hives   Dextromethorphan-Guaifenesin     insomnia   Morphine And Related Hives   Sulfonamide Derivatives Rash   Vicodin [Hydrocodone-Acetaminophen] Hives    Consultations: Surgery    Procedures/Studies: CT ABDOMEN PELVIS W CONTRAST  Result Date: 12/17/2020 CLINICAL DATA:  Abdominal pain, recent small-bowel obstruction, suspected obstruction EXAM: CT ABDOMEN AND PELVIS WITH CONTRAST TECHNIQUE: Multidetector CT imaging of the abdomen and pelvis was performed using the standard protocol following bolus administration of intravenous contrast. Sagittal and coronal MPR images reconstructed from axial data set. CONTRAST:  121mL OMNIPAQUE IOHEXOL 300 MG/ML SOLN IV. Dilute oral contrast. COMPARISON:  12/10/2020 FINDINGS: Lower chest: BILATERAL pleural effusions in significant lower lobe atelectasis. Enlargement of cardiac chambers. Pacemaker leads RIGHT atrium and RIGHT ventricle. Small pericardial effusion. Hepatobiliary: Tiny RIGHT lobe liver cyst superiorly, stable. Gallbladder and liver otherwise normal appearance. Pancreas: Normal appearance Spleen: Normal appearance Adrenals/Urinary Tract: Adrenal glands, kidneys, ureters, and bladder normal appearance Stomach/Bowel: Stomach decompressed. Normal appendix. Diverticulosis of sigmoid colon without evidence of diverticulitis. Remainder of colon and terminal ileum decompressed. Proximal small bowel loops are dilated. Transition from dilated to nondilated small bowel loops occurs in the upper RIGHT pelvis. Mild swirling of the mesentery is seen. This could be due to adhesion or an internal hernia. No bowel wall  thickening or perforation. Vascular/Lymphatic: Atherosclerotic calcifications aorta and iliac arteries. Aorta normal caliber. Scattered pelvic phleboliths. No adenopathy. Reproductive: Uterus surgically absent. Nonvisualization of ovaries. Other: Small amount of free fluid in pelvis. No free air or additional hernia. Musculoskeletal: Osseous demineralization. Degenerative changes and scoliosis of lumbar spine. IMPRESSION: Small-bowel obstruction with transition from dilated to nondilated small bowel loops in the upper RIGHT pelvis, question due to adhesion or an internal hernia. Small amount of nonspecific free fluid in pelvis. BILATERAL pleural effusions and significant lower lobe atelectasis. Enlargement of cardiac chambers with small pericardial effusion. Sigmoid diverticulosis without evidence of diverticulitis. Aortic Atherosclerosis (ICD10-I70.0). Electronically Signed   By: Lavonia Dana M.D.   On: 12/17/2020 16:39   CT ABDOMEN PELVIS W CONTRAST  Addendum Date: 12/10/2020   ADDENDUM REPORT: 12/10/2020 04:50 ADDENDUM: These results were called by telephone at the time of physician contact on 12/10/2020 at 4:50 am to provider COURTNEY HORTON , who verbally acknowledged these results. Electronically Signed   By: Lovena Le M.D.   On: 12/10/2020 04:50   Result Date: 12/10/2020 CLINICAL DATA:  Abdominal pain and cramping with emesis since last night EXAM: CT ABDOMEN AND PELVIS WITH CONTRAST TECHNIQUE: Multidetector CT imaging of the abdomen and pelvis was performed using the standard protocol following bolus administration of intravenous contrast. CONTRAST:  26mL OMNIPAQUE IOHEXOL 300 MG/ML  SOLN  COMPARISON:  CT 06/16/2019 FINDINGS: Lower chest: Small pericardial effusion, diminished from prior exam. Pacer leads terminate at the cardiac apex and right atrium. Cardiac size is borderline enlarged though similar to comparison prior. Scarring and atelectatic changes in the left lung base adjacent a large hiatal  hernia further detailed below. Some additional atelectasis noted in the posterior right lower lobe. Few scattered calcified noncalcified lower lobe micro nodules are not significantly changed from prior. Hepatobiliary: Few stable subcentimeter hypoattenuating foci throughout the liver, too small to fully characterize on CT imaging but statistically likely benign. A slightly larger 11 mm hypodense focus towards the hepatic dome, probable cyst. Gallbladder is unremarkable. No significant biliary ductal dilatation accounting for typical senescent changes. No visible intraductal gallstone. Pancreas: No pancreatic ductal dilatation or surrounding inflammatory changes. Spleen: Normal in size. No concerning splenic lesions. Adrenals/Urinary Tract: No concerning adrenal nodules or masses. Kidneys demonstrate symmetric enhancementand excretion. Scattered subcentimeter hypoattenuating foci throughout both kidneys too small to fully characterize on CT imaging but statistically likely benign. No suspicious renal lesion, urolithiasis or hydronephrosis. Urinary bladder is unremarkable for the degree of distention. Stomach/Bowel: Large hiatal hernia with what appears to be some organoaxial rotation similar to prior exam albeit with some increased gastric distention by fluid air and ingested material. Small amount of low-attenuation free fluid is seen adjacent the herniated stomach (2/12). No extraluminal gas, organized collection or abscess is seen. Some mild mucosal hyperemia within the herniated gastric segments and gastric antrum are nonspecific. Duodenum with a normal sweep across the midline abdomen. Distal small bowel appears diffusely fluid-filled with several segments demonstrating some conspicuous mural thickening in the right lower quadrant (2/37). There are few clustered transition points noted in the right lower quadrant with displaced small bowel and associated hazy mesenteric changes (2/51) concerning for an  obstructive, internal hernia involving the distal ileum with decompression to the level of the ileocecal valve in the right lower quadrant. Normal appendix. Moderate colonic stool burden. Extensive pancolonic diverticulosis most pronounced in the more distal segments. No significant colonic thickening accounting for underdistention. Vascular/Lymphatic: Atherosclerotic calcifications within the abdominal aorta and branch vessels. No aneurysm or ectasia. Extensive aortic tortuosity similar to prior. Some congested lymph nodes noted within the right lower quadrant as well as additional reactive nodes in the central mesentery. Reproductive: Uterus is surgically absent. No concerning adnexal lesions. Other: Small volume of free fluid seen in the deep pelvis. Hazy mesenteric stranding in the right lower quadrant associated with the deviated small bowel loops. Additional trace fluid in the left upper quadrant, upper retroperitoneum and adjacent the herniated proximal stomach. No free air. No other bowel containing hernia. Musculoskeletal: Severe levocurvature of the thoracolumbar junction and dextrocurvature of the lower lumbar levels. Transitional lumbosacral vertebrae. Configuration is stable from priors. Multilevel discogenic and facet degenerative change with additional degenerative features in the hips and pelvis. No acute osseous abnormality or suspicious osseous lesion. IMPRESSION: 1. Appearance concerning for a developing small bowel obstruction secondary to what is likely an internal herniation of the distal ileum. Extensive hazy mesenteric stranding around the deviated loops of small bowel present in the right lower quadrant, as well as thickening of several of the obstructed, fluid-filled loops. Could reflect developing vascular or lymphatic compromise. 2. Large hiatal hernia. Small amount of nonspecific mucosal hyperemia as well as some free fluid adjacent the herniated gastric body. No extraluminal gas to  suggest a frank perforation. Fluid may be reactive or redistributed from the more distal small bowel process. Considered  decompression and possible direct visualization as clinically available. 3. Diverticulosis without evidence of acute diverticulitis. 4. Cardiomegaly and pericardial effusion. 5.  Aortic Atherosclerosis (ICD10-I70.0). 6. Severe scoliosis and associated chest wall deformity. 7. Several scattered calcified noncalcified pulmonary nodules without significant interval change. Electronically Signed: By: Lovena Le M.D. On: 12/10/2020 04:34   DG CHEST PORT 1 VIEW  Result Date: 12/16/2020 CLINICAL DATA:  Cough for 1 week. EXAM: PORTABLE CHEST 1 VIEW COMPARISON:  04/21/2017 chest radiograph, 06/14/2020 chest CT and prior studies FINDINGS: The cardiomediastinal silhouette is unchanged with a large hiatal hernia again noted. Mild bibasilar atelectasis, LEFT greater than RIGHT, noted. A LEFT-sided pacemaker is unchanged. There is no evidence of focal airspace disease, pulmonary edema, suspicious pulmonary nodule/mass, pleural effusion, or pneumothorax. No acute bony abnormalities are identified. IMPRESSION: 1. Mild bibasilar atelectasis. 2. Large hiatal hernia. Electronically Signed   By: Margarette Canada M.D.   On: 12/16/2020 11:44   DG Abd Portable 1V  Result Date: 12/16/2020 CLINICAL DATA:  Abdominal pain.  Recent small bowel obstruction. EXAM: PORTABLE ABDOMEN - 1 VIEW COMPARISON:  12/14/2020 radiographs, 12/10/2020 CT and prior studies FINDINGS: NG tube is been removed. Increasing gaseous distension of small bowel is noted from 12/14/2020. Some gas is noted within the colon and rectum. No other significant changes are noted. Severe degenerative changes and scoliosis of the thoracolumbar spine noted. IMPRESSION: Increasing gaseous distension of small bowel from 12/14/2020 which may represent recurrent small-bowel obstruction. Electronically Signed   By: Margarette Canada M.D.   On: 12/16/2020 10:19   DG  Abd Portable 1V  Result Date: 12/14/2020 CLINICAL DATA:  Abdominal pain and distension. EXAM: PORTABLE ABDOMEN - 1 VIEW COMPARISON:  None. FINDINGS: Nasogastric tube remains in place with tip overlying the gastric antrum or proximal duodenum. No evidence of dilated bowel loops. Severe thoracolumbar levoscoliosis and degenerative changes again noted. IMPRESSION: Nonobstructive bowel gas pattern. Nasogastric tube remains in place. Electronically Signed   By: Marlaine Hind M.D.   On: 12/14/2020 09:50   DG Abd Portable 1V-Small Bowel Obstruction Protocol-24 hr delay  Result Date: 12/12/2020 CLINICAL DATA:  86 year old female with abdominal pain, nausea vomiting. EXAM: PORTABLE ABDOMEN - 1 VIEW COMPARISON:  Abdominal radiograph dated 12/11/2020. FINDINGS: Enteric tube with tip in the right lower abdomen in similar position. There is no bowel dilatation or evidence of obstruction. No free air or radiopaque calculi. There is osteopenia with severe scoliosis and degenerative changes of the spine. No acute osseous pathology. IMPRESSION: Similar positioning of the enteric tube. No evidence of bowel obstruction. Electronically Signed   By: Anner Crete M.D.   On: 12/12/2020 21:19   DG Abd Portable 1V-Small Bowel Obstruction Protocol-initial, 8 hr delay  Result Date: 12/11/2020 CLINICAL DATA:  Small bowel obstruction EXAM: PORTABLE ABDOMEN - 1 VIEW COMPARISON:  12/11/2020 FINDINGS: The enteric tube is been advanced with tip now in the right mid abdomen consistent with location in the proximal duodenum. Contrast material is demonstrated within dilated small bowel. No contrast material demonstrated in the colon. Changes are consistent with small bowel obstruction. Linear atelectasis in the lung bases. Lumbar scoliosis convex towards the left. Old right rib fractures. IMPRESSION: Enteric tube is advanced since prior study with tip likely in the duodenum. Dilated contrast filled small bowel consistent with small bowel  obstruction. Electronically Signed   By: Lucienne Capers M.D.   On: 12/11/2020 20:18   DG Abd Portable 1V  Result Date: 12/11/2020 CLINICAL DATA:  Nasogastric catheter EXAM: PORTABLE ABDOMEN -  1 VIEW COMPARISON:  None. FINDINGS: Gastric catheter is noted in the distal stomach and likely into the second portion of the duodenum. Scattered large and small bowel gas is noted. Degenerative changes of lumbar spine are noted with scoliosis concave to the right at the thoracolumbar junction. IMPRESSION: Gastric catheter appears to extend into the second portion of the duodenum. Electronically Signed   By: Inez Catalina M.D.   On: 12/11/2020 08:11   DG Naso G Tube Plc W/Fl W/Rad  Result Date: 12/10/2020 CLINICAL DATA:  nasogastric tube placement EXAM: NASO G TUBE PLACEMENT WITH FL AND WITH RAD CONTRAST:  Light barium FLUOROSCOPY TIME:  Fluoroscopy Time:  3 minutes 12 seconds Radiation Exposure Index (if provided by the fluoroscopic device): 6.2 mGy Number of Acquired Spot Images: 0 COMPARISON:  Chest CT 06/14/2020 FINDINGS: Nasogastric tube placement was attempted utilizing fluoroscopic guidance. A 12 French nasogastric tube was inserted into left nostril and passed to level of C7/T1, where there was abrupt resistance and kinking of the nasogastric tube. A wire was placed through the nasogastric tube, and additional attempts were made to pass the tube further into the esophagus, without success. A small amount of light barium was injected through the tube which passed into the lower esophagus. A 12 French nasogastric tube was then removed and an attempt was made to pass a 10 French nasogastric tube. Again, the nasogastric tube met abrupt resistance at the level of C7/T1, with kinking of the tube. IMPRESSION: Unsuccessful nasogastric tube placement under fluoroscopic guidance despite multiple attempts and technique. Abrupt and persistent resistance was met at the level of C7/T1 on every attempt. Consider CT of the  neck/chest to rule out an obstructing lesion. These results were called by telephone at the time of interpretation on 12/10/2020 at 3:20 pm to provider [facility] , who verbally acknowledged these results. Electronically Signed   By: Maurine Simmering   On: 12/10/2020 15:22   ECHOCARDIOGRAM COMPLETE  Result Date: 12/18/2020    ECHOCARDIOGRAM REPORT   Patient Name:   [name] Date of Exam: 12/18/2020 Medical Rec #:  [id number]         Height:       63.0 in Accession #:    [id number]        Weight:       140.0 lb Date of Birth:  Jan 02, 1935         BSA:          1.662 m Patient Age:    9 years          BP:           135/71 mmHg Patient Gender: F                 HR:           62 bpm. Exam Location:  Inpatient Procedure: 2D Echo, Color Doppler and Cardiac Doppler Indications:    Pericardial effusion  History:        Patient has prior history of Echocardiogram examinations, most                 recent 12/18/2014. Pacemaker; Risk Factors:Hypertension.  Sonographer:    Cammy Brochure Referring Phys: [id number] KELLY A GRIFFITH IMPRESSIONS  1. Left ventricular ejection fraction, by estimation, is 60 to 65%. The left ventricle has normal function. The left ventricle has no regional wall motion abnormalities. There is mild left ventricular hypertrophy of the septal segment. Left ventricular diastolic parameters are  consistent with Grade II diastolic dysfunction (pseudonormalization). Elevated left ventricular end-diastolic pressure.  2. Right ventricular systolic function is normal. The right ventricular size is normal. There is moderately elevated pulmonary artery systolic pressure.  3. Left atrial size was moderately dilated.  4. There is no evidence of cardiac tamponade.  5. The mitral valve is normal in structure. No evidence of mitral valve regurgitation. No evidence of mitral stenosis.  6. Tricuspid valve regurgitation is mild to moderate.  7. The aortic valve is tricuspid. There is mild calcification of the  aortic valve. There is mild thickening of the aortic valve. Aortic valve regurgitation is not visualized. No aortic stenosis is present.  8. The inferior vena cava is normal in size with greater than 50% respiratory variability, suggesting right atrial pressure of 3 mmHg. FINDINGS  Left Ventricle: Left ventricular ejection fraction, by estimation, is 60 to 65%. The left ventricle has normal function. The left ventricle has no regional wall motion abnormalities. The left ventricular internal cavity size was normal in size. There is  mild left ventricular hypertrophy of the septal segment. Left ventricular diastolic parameters are consistent with Grade II diastolic dysfunction (pseudonormalization). Elevated left ventricular end-diastolic pressure. Right Ventricle: The right ventricular size is normal. No increase in right ventricular wall thickness. Right ventricular systolic function is normal. There is moderately elevated pulmonary artery systolic pressure. The tricuspid regurgitant velocity is 3.75 m/s, and with an assumed right atrial pressure of 3 mmHg, the estimated right ventricular systolic pressure is 59.2 mmHg. Left Atrium: Left atrial size was moderately dilated. Right Atrium: Right atrial size was normal in size. Pericardium: Trivial pericardial effusion is present. There is no evidence of cardiac tamponade. Mitral Valve: The mitral valve is normal in structure. Mild mitral annular calcification. No evidence of mitral valve regurgitation. No evidence of mitral valve stenosis. Tricuspid Valve: The tricuspid valve is normal in structure. Tricuspid valve regurgitation is mild to moderate. No evidence of tricuspid stenosis. Aortic Valve: The aortic valve is tricuspid. There is mild calcification of the aortic valve. There is mild thickening of the aortic valve. There is mild aortic valve annular calcification. Aortic valve regurgitation is not visualized. No aortic stenosis  is present. Aortic valve mean  gradient measures 6.0 mmHg. Aortic valve peak gradient measures 10.9 mmHg. Aortic valve area, by VTI measures 2.99 cm. Pulmonic Valve: The pulmonic valve was normal in structure. Pulmonic valve regurgitation is not visualized. No evidence of pulmonic stenosis. Aorta: The aortic root is normal in size and structure. Venous: The inferior vena cava is normal in size with greater than 50% respiratory variability, suggesting right atrial pressure of 3 mmHg. IAS/Shunts: No atrial level shunt detected by color flow Doppler. Additional Comments: A device lead is visualized.  LEFT VENTRICLE PLAX 2D LVIDd:         3.90 cm  Diastology LVIDs:         2.20 cm  LV e' medial:    4.24 cm/s LV PW:         1.00 cm  LV E/e' medial:  28.8 LV IVS:        1.10 cm  LV e' lateral:   5.87 cm/s LVOT diam:     2.10 cm  LV E/e' lateral: 20.8 LV SV:         106 LV SV Index:   64 LVOT Area:     3.46 cm  RIGHT VENTRICLE          IVC RV Basal diam:  3.90 cm  IVC diam: 1.40 cm LEFT ATRIUM             Index       RIGHT ATRIUM           Index LA diam:        3.80 cm 2.29 cm/m  RA Area:     11.70 cm LA Vol (A2C):   57.1 ml 34.36 ml/m RA Volume:   21.60 ml  13.00 ml/m LA Vol (A4C):   59.8 ml 35.99 ml/m LA Biplane Vol: 60.3 ml 36.29 ml/m  AORTIC VALVE AV Area (Vmax):    3.15 cm AV Area (Vmean):   2.96 cm AV Area (VTI):     2.99 cm AV Vmax:           165.00 cm/s AV Vmean:          110.000 cm/s AV VTI:            0.356 m AV Peak Grad:      10.9 mmHg AV Mean Grad:      6.0 mmHg LVOT Vmax:         150.00 cm/s LVOT Vmean:        94.000 cm/s LVOT VTI:          0.307 m LVOT/AV VTI ratio: 0.86  AORTA Ao Root diam: 3.50 cm Ao Asc diam:  3.40 cm MITRAL VALVE                TRICUSPID VALVE MV Area (PHT): 3.60 cm     TR Peak grad:   56.2 mmHg MV Decel Time: 211 msec     TR Vmax:        375.00 cm/s MV E velocity: 122.00 cm/s MV A velocity: 105.00 cm/s  SHUNTS MV E/A ratio:  1.16         Systemic VTI:  0.31 m                             Systemic Diam:  2.10 cm Skeet Latch MD Electronically signed by Skeet Latch MD Signature Date/Time: 12/18/2020/2:37:46 PM    Final       Subjective: Patient is feeling better, this am with no nausea or vomiting, no chest pain or dyspnea.   Discharge Exam: Vitals:   12/19/20 0440 12/19/20 0541  BP: 138/83 (!) 156/85  Pulse: 63 63  Resp: 18 18  Temp: 99.6 F (37.6 C) 99.4 F (37.4 C)  SpO2: 96% 93%   Vitals:   12/18/20 2350 12/19/20 0326 12/19/20 0440 12/19/20 0541  BP: (!) 154/87  138/83 (!) 156/85  Pulse: 71  63 63  Resp: $Remo'18  18 18  'JgBZx$ Temp: 98.7 F (37.1 C)  99.6 F (37.6 C) 99.4 F (37.4 C)  TempSrc: Oral  Oral Oral  SpO2: 98% 94% 96% 93%  Weight:      Height:        General: Not in pain or dyspnea.  Neurology: Awake and alert, non focal  E ENT: mild pallor, no icterus, oral mucosa moist Cardiovascular: No JVD. S1-S2 present, rhythmic, no gallops, rubs, or murmurs. No lower extremity edema. Pulmonary: positive breath sounds bilaterally, adequate air movement, no wheezing, rhonchi or rales. Gastrointestinal. Abdomen soft and non tender Skin. No rashes Musculoskeletal: no joint deformities   The results of significant diagnostics from this hospitalization (including imaging, microbiology, ancillary and laboratory) are listed below for reference.     Microbiology:  Recent Results (from the past 240 hour(s))  Resp Panel by RT-PCR (Flu A&B, Covid) Nasopharyngeal Swab     Status: None   Collection Time: 12/10/20  6:04 AM   Specimen: Nasopharyngeal Swab; Nasopharyngeal(NP) swabs in vial transport medium  Result Value Ref Range Status   SARS Coronavirus 2 by RT PCR NEGATIVE NEGATIVE Final    Comment: (NOTE) SARS-CoV-2 target nucleic acids are NOT DETECTED.  The SARS-CoV-2 RNA is generally detectable in upper respiratory specimens during the acute phase of infection. The lowest concentration of SARS-CoV-2 viral copies this assay can detect is 138 copies/mL. A negative result  does not preclude SARS-Cov-2 infection and should not be used as the sole basis for treatment or other patient management decisions. A negative result may occur with  improper specimen collection/handling, submission of specimen other than nasopharyngeal swab, presence of viral mutation(s) within the areas targeted by this assay, and inadequate number of viral copies(<138 copies/mL). A negative result must be combined with clinical observations, patient history, and epidemiological information. The expected result is Negative.  Fact Sheet for Patients:  EntrepreneurPulse.com.au  Fact Sheet for Healthcare Providers:  IncredibleEmployment.be  This test is no t yet approved or cleared by the [place] FDA and  has been authorized for detection and/or diagnosis of SARS-CoV-2 by FDA under an Emergency Use Authorization (EUA). This EUA will remain  in effect (meaning this test can be used) for the duration of the COVID-19 declaration under Section 564(b)(1) of the Act, 21 U.S.C.section 360bbb-3(b)(1), unless the authorization is terminated  or revoked sooner.       Influenza A by PCR NEGATIVE NEGATIVE Final   Influenza B by PCR NEGATIVE NEGATIVE Final    Comment: (NOTE) The Xpert Xpress SARS-CoV-2/FLU/RSV plus assay is intended as an aid in the diagnosis of influenza from Nasopharyngeal swab specimens and should not be used as a sole basis for treatment. Nasal washings and aspirates are unacceptable for Xpert Xpress SARS-CoV-2/FLU/RSV testing.  Fact Sheet for Patients: EntrepreneurPulse.com.au  Fact Sheet for Healthcare Providers: IncredibleEmployment.be  This test is not yet approved or cleared by the [place] FDA and has been authorized for detection and/or diagnosis of SARS-CoV-2 by FDA under an Emergency Use Authorization (EUA). This EUA will remain in effect (meaning this test can be used) for  the duration of the COVID-19 declaration under Section 564(b)(1) of the Act, 21 U.S.C. section 360bbb-3(b)(1), unless the authorization is terminated or revoked.  Performed at [facility], [address], [place], [place] [address]   Gastrointestinal Panel by PCR , Stool     Status: Abnormal   Collection Time: 12/16/20  5:29 PM   Specimen: Stool  Result Value Ref Range Status   Campylobacter species NOT DETECTED NOT DETECTED Final   Plesimonas shigelloides NOT DETECTED NOT DETECTED Final   Salmonella species NOT DETECTED NOT DETECTED Final   Yersinia enterocolitica NOT DETECTED NOT DETECTED Final   Vibrio species NOT DETECTED NOT DETECTED Final   Vibrio cholerae NOT DETECTED NOT DETECTED Final   Enteroaggregative E coli (EAEC) DETECTED (A) NOT DETECTED Final    Comment: RESULT CALLED TO, READ BACK BY AND VERIFIED WITH: MIRIAM PEETEH 12/17/20 1317 KLW    Enteropathogenic E coli (EPEC) NOT DETECTED NOT DETECTED Final   Enterotoxigenic E coli (ETEC) NOT DETECTED NOT DETECTED Final   Shiga like toxin producing E coli (STEC) NOT DETECTED NOT DETECTED Final   Shigella/Enteroinvasive E coli (EIEC) NOT DETECTED NOT DETECTED Final   Cryptosporidium NOT DETECTED  NOT DETECTED Final   Cyclospora cayetanensis NOT DETECTED NOT DETECTED Final   Entamoeba histolytica NOT DETECTED NOT DETECTED Final   Giardia lamblia NOT DETECTED NOT DETECTED Final   Adenovirus F40/41 NOT DETECTED NOT DETECTED Final   Astrovirus NOT DETECTED NOT DETECTED Final   Norovirus GI/GII NOT DETECTED NOT DETECTED Final   Rotavirus A NOT DETECTED NOT DETECTED Final   Sapovirus (I, II, IV, and V) NOT DETECTED NOT DETECTED Final    Comment: Performed at [facility], [address] [place], [place] [address]  Stool culture (children & immunocomp patients)     Status: None (Preliminary result)   Collection Time: 12/16/20  5:29 PM   Specimen: Stool  Result Value Ref Range Status    Salmonella/Shigella Screen PENDING  Incomplete   Campylobacter Culture PENDING  Incomplete   E coli, Shiga toxin Assay Negative Negative Final    Comment: (NOTE) Performed At: [facility] [place] [address] [place], [place] [id number] Jolene Schimke MD BP:[id number]   Resp Panel by RT-PCR (Flu A&B, Covid) Nasopharyngeal Swab     Status: None   Collection Time: 12/17/20  6:26 PM   Specimen: Nasopharyngeal Swab; Nasopharyngeal(NP) swabs in vial transport medium  Result Value Ref Range Status   SARS Coronavirus 2 by RT PCR NEGATIVE NEGATIVE Final    Comment: (NOTE) SARS-CoV-2 target nucleic acids are NOT DETECTED.  The SARS-CoV-2 RNA is generally detectable in upper respiratory specimens during the acute phase of infection. The lowest concentration of SARS-CoV-2 viral copies this assay can detect is 138 copies/mL. A negative result does not preclude SARS-Cov-2 infection and should not be used as the sole basis for treatment or other patient management decisions. A negative result may occur with  improper specimen collection/handling, submission of specimen other than nasopharyngeal swab, presence of viral mutation(s) within the areas targeted by this assay, and inadequate number of viral copies(<138 copies/mL). A negative result must be combined with clinical observations, patient history, and epidemiological information. The expected result is Negative.  Fact Sheet for Patients:  BloggerCourse.com  Fact Sheet for Healthcare Providers:  SeriousBroker.it  This test is no t yet approved or cleared by the [place] FDA and  has been authorized for detection and/or diagnosis of SARS-CoV-2 by FDA under an Emergency Use Authorization (EUA). This EUA will remain  in effect (meaning this test can be used) for the duration of the COVID-19 declaration under Section 564(b)(1) of the Act, 21 U.S.C.section 360bbb-3(b)(1), unless the  authorization is terminated  or revoked sooner.       Influenza A by PCR NEGATIVE NEGATIVE Final   Influenza B by PCR NEGATIVE NEGATIVE Final    Comment: (NOTE) The Xpert Xpress SARS-CoV-2/FLU/RSV plus assay is intended as an aid in the diagnosis of influenza from Nasopharyngeal swab specimens and should not be used as a sole basis for treatment. Nasal washings and aspirates are unacceptable for Xpert Xpress SARS-CoV-2/FLU/RSV testing.  Fact Sheet for Patients: BloggerCourse.com  Fact Sheet for Healthcare Providers: SeriousBroker.it  This test is not yet approved or cleared by the [place] FDA and has been authorized for detection and/or diagnosis of SARS-CoV-2 by FDA under an Emergency Use Authorization (EUA). This EUA will remain in effect (meaning this test can be used) for the duration of the COVID-19 declaration under Section 564(b)(1) of the Act, 21 U.S.C. section 360bbb-3(b)(1), unless the authorization is terminated or revoked.  Performed at [facility], [address], [place], [place] [address]  Labs: BNP (last 3 results) Recent Labs    12/17/20 1741  BNP 357.0*   Basic Metabolic Panel: Recent Labs  Lab 12/14/20 0619 12/16/20 0928 12/18/20 0458 12/19/20 0704  NA 137 140 140 138  K 4.0 3.1* 3.6 3.8  CL 107 108 103 104  CO2 19* $Remov'27 28 25  'lRgadw$ GLUCOSE 79 119* 104* 103*  BUN 9 9 5* <5*  CREATININE 0.75 0.48 0.60 0.47  CALCIUM 8.4* 8.3* 7.9* 8.4*  MG 1.9 1.6* 2.0 1.7   Liver Function Tests: No results for input(s): AST, ALT, ALKPHOS, BILITOT, PROT, ALBUMIN in the last 168 hours. No results for input(s): LIPASE, AMYLASE in the last 168 hours. No results for input(s): AMMONIA in the last 168 hours. CBC: Recent Labs  Lab 12/14/20 0619 12/16/20 0928 12/18/20 0458  WBC 9.8 7.8 8.8  NEUTROABS  --   --  5.3  HGB 13.0 13.5 11.6*  HCT 39.8 39.9 34.1*  MCV 97.3 92.8 94.5  PLT 225 250 272    Cardiac Enzymes: No results for input(s): CKTOTAL, CKMB, CKMBINDEX, TROPONINI in the last 168 hours. BNP: Invalid input(s): POCBNP CBG: No results for input(s): GLUCAP in the last 168 hours. D-Dimer No results for input(s): DDIMER in the last 72 hours. Hgb A1c No results for input(s): HGBA1C in the last 72 hours. Lipid Profile No results for input(s): CHOL, HDL, LDLCALC, TRIG, CHOLHDL, LDLDIRECT in the last 72 hours. Thyroid function studies No results for input(s): TSH, T4TOTAL, T3FREE, THYROIDAB in the last 72 hours.  Invalid input(s): FREET3 Anemia work up No results for input(s): VITAMINB12, FOLATE, FERRITIN, TIBC, IRON, RETICCTPCT in the last 72 hours. Urinalysis    Component Value Date/Time   COLORURINE YELLOW 12/13/2018 1238   APPEARANCEUR CLEAR 12/13/2018 1238   LABSPEC 1.010 12/13/2018 1238   PHURINE 7.0 12/13/2018 1238   GLUCOSEU NEGATIVE 12/13/2018 1238   HGBUR NEGATIVE 12/13/2018 1238   HGBUR negative 12/24/2008 1537   BILIRUBINUR NEGATIVE 12/13/2018 1238   BILIRUBINUR negative 12/13/2018 1217   KETONESUR NEGATIVE 12/13/2018 1238   PROTEINUR Negative 12/13/2018 1217   PROTEINUR NEG 08/07/2013 1149   UROBILINOGEN 0.2 12/13/2018 1238   UROBILINOGEN 0.2 12/13/2018 1217   NITRITE NEGATIVE 12/13/2018 1238   NITRITE negative 12/13/2018 1217   LEUKOCYTESUR NEGATIVE 12/13/2018 1238   LEUKOCYTESUR Negative 12/13/2018 1217   Sepsis Labs Invalid input(s): PROCALCITONIN,  WBC,  LACTICIDVEN Microbiology Recent Results (from the past 240 hour(s))  Resp Panel by RT-PCR (Flu A&B, Covid) Nasopharyngeal Swab     Status: None   Collection Time: 12/10/20  6:04 AM   Specimen: Nasopharyngeal Swab; Nasopharyngeal(NP) swabs in vial transport medium  Result Value Ref Range Status   SARS Coronavirus 2 by RT PCR NEGATIVE NEGATIVE Final    Comment: (NOTE) SARS-CoV-2 target nucleic acids are NOT DETECTED.  The SARS-CoV-2 RNA is generally detectable in upper  respiratory specimens during the acute phase of infection. The lowest concentration of SARS-CoV-2 viral copies this assay can detect is 138 copies/mL. A negative result does not preclude SARS-Cov-2 infection and should not be used as the sole basis for treatment or other patient management decisions. A negative result may occur with  improper specimen collection/handling, submission of specimen other than nasopharyngeal swab, presence of viral mutation(s) within the areas targeted by this assay, and inadequate number of viral copies(<138 copies/mL). A negative result must be combined with clinical observations, patient history, and epidemiological information. The expected result is Negative.  Fact Sheet for Patients:  EntrepreneurPulse.com.au  Fact  Sheet for Healthcare Providers:  IncredibleEmployment.be  This test is no t yet approved or cleared by the [place] FDA and  has been authorized for detection and/or diagnosis of SARS-CoV-2 by FDA under an Emergency Use Authorization (EUA). This EUA will remain  in effect (meaning this test can be used) for the duration of the COVID-19 declaration under Section 564(b)(1) of the Act, 21 U.S.C.section 360bbb-3(b)(1), unless the authorization is terminated  or revoked sooner.       Influenza A by PCR NEGATIVE NEGATIVE Final   Influenza B by PCR NEGATIVE NEGATIVE Final    Comment: (NOTE) The Xpert Xpress SARS-CoV-2/FLU/RSV plus assay is intended as an aid in the diagnosis of influenza from Nasopharyngeal swab specimens and should not be used as a sole basis for treatment. Nasal washings and aspirates are unacceptable for Xpert Xpress SARS-CoV-2/FLU/RSV testing.  Fact Sheet for Patients: EntrepreneurPulse.com.au  Fact Sheet for Healthcare Providers: IncredibleEmployment.be  This test is not yet approved or cleared by the [place] FDA and has been  authorized for detection and/or diagnosis of SARS-CoV-2 by FDA under an Emergency Use Authorization (EUA). This EUA will remain in effect (meaning this test can be used) for the duration of the COVID-19 declaration under Section 564(b)(1) of the Act, 21 U.S.C. section 360bbb-3(b)(1), unless the authorization is terminated or revoked.  Performed at [facility], [address], [place], [place] [address]   Gastrointestinal Panel by PCR , Stool     Status: Abnormal   Collection Time: 12/16/20  5:29 PM   Specimen: Stool  Result Value Ref Range Status   Campylobacter species NOT DETECTED NOT DETECTED Final   Plesimonas shigelloides NOT DETECTED NOT DETECTED Final   Salmonella species NOT DETECTED NOT DETECTED Final   Yersinia enterocolitica NOT DETECTED NOT DETECTED Final   Vibrio species NOT DETECTED NOT DETECTED Final   Vibrio cholerae NOT DETECTED NOT DETECTED Final   Enteroaggregative E coli (EAEC) DETECTED (A) NOT DETECTED Final    Comment: RESULT CALLED TO, READ BACK BY AND VERIFIED WITH: MIRIAM PEETEH 12/17/20 1317 KLW    Enteropathogenic E coli (EPEC) NOT DETECTED NOT DETECTED Final   Enterotoxigenic E coli (ETEC) NOT DETECTED NOT DETECTED Final   Shiga like toxin producing E coli (STEC) NOT DETECTED NOT DETECTED Final   Shigella/Enteroinvasive E coli (EIEC) NOT DETECTED NOT DETECTED Final   Cryptosporidium NOT DETECTED NOT DETECTED Final   Cyclospora cayetanensis NOT DETECTED NOT DETECTED Final   Entamoeba histolytica NOT DETECTED NOT DETECTED Final   Giardia lamblia NOT DETECTED NOT DETECTED Final   Adenovirus F40/41 NOT DETECTED NOT DETECTED Final   Astrovirus NOT DETECTED NOT DETECTED Final   Norovirus GI/GII NOT DETECTED NOT DETECTED Final   Rotavirus A NOT DETECTED NOT DETECTED Final   Sapovirus (I, II, IV, and V) NOT DETECTED NOT DETECTED Final    Comment: Performed at [facility], [address] [place], [place] [address]  Stool culture  (children & immunocomp patients)     Status: None (Preliminary result)   Collection Time: 12/16/20  5:29 PM   Specimen: Stool  Result Value Ref Range Status   Salmonella/Shigella Screen PENDING  Incomplete   Campylobacter Culture PENDING  Incomplete   E coli, Shiga toxin Assay Negative Negative Final    Comment: (NOTE) Performed At: [facility] [place], [place] [id number] Rush Farmer MD RJ:[id number]   Resp Panel by RT-PCR (Flu A&B, Covid) Nasopharyngeal Swab     Status: None  Collection Time: 12/17/20  6:26 PM   Specimen: Nasopharyngeal Swab; Nasopharyngeal(NP) swabs in vial transport medium  Result Value Ref Range Status   SARS Coronavirus 2 by RT PCR NEGATIVE NEGATIVE Final    Comment: (NOTE) SARS-CoV-2 target nucleic acids are NOT DETECTED.  The SARS-CoV-2 RNA is generally detectable in upper respiratory specimens during the acute phase of infection. The lowest concentration of SARS-CoV-2 viral copies this assay can detect is 138 copies/mL. A negative result does not preclude SARS-Cov-2 infection and should not be used as the sole basis for treatment or other patient management decisions. A negative result may occur with  improper specimen collection/handling, submission of specimen other than nasopharyngeal swab, presence of viral mutation(s) within the areas targeted by this assay, and inadequate number of viral copies(<138 copies/mL). A negative result must be combined with clinical observations, patient history, and epidemiological information. The expected result is Negative.  Fact Sheet for Patients:  EntrepreneurPulse.com.au  Fact Sheet for Healthcare Providers:  IncredibleEmployment.be  This test is no t yet approved or cleared by the [place] FDA and  has been authorized for detection and/or diagnosis of SARS-CoV-2 by FDA under an Emergency Use Authorization (EUA). This EUA will remain  in  effect (meaning this test can be used) for the duration of the COVID-19 declaration under Section 564(b)(1) of the Act, 21 U.S.C.section 360bbb-3(b)(1), unless the authorization is terminated  or revoked sooner.       Influenza A by PCR NEGATIVE NEGATIVE Final   Influenza B by PCR NEGATIVE NEGATIVE Final    Comment: (NOTE) The Xpert Xpress SARS-CoV-2/FLU/RSV plus assay is intended as an aid in the diagnosis of influenza from Nasopharyngeal swab specimens and should not be used as a sole basis for treatment. Nasal washings and aspirates are unacceptable for Xpert Xpress SARS-CoV-2/FLU/RSV testing.  Fact Sheet for Patients: EntrepreneurPulse.com.au  Fact Sheet for Healthcare Providers: IncredibleEmployment.be  This test is not yet approved or cleared by the [place] FDA and has been authorized for detection and/or diagnosis of SARS-CoV-2 by FDA under an Emergency Use Authorization (EUA). This EUA will remain in effect (meaning this test can be used) for the duration of the COVID-19 declaration under Section 564(b)(1) of the Act, 21 U.S.C. section 360bbb-3(b)(1), unless the authorization is terminated or revoked.  Performed at [facility], [place] [address], [place], [place] [address]      Time coordinating discharge: 45 minutes  SIGNED:   Tawni Millers, MD  [facility] 12/19/2020, 10:59 AM

## 2020-12-19 NOTE — Progress Notes (Signed)
Progress Note  9 Days Post-Op  Subjective: CC: events overnight noted. Felt a little short of breath with SVT but overall better this am. She had some bloating yesterday but this has resolved. BM this am. No nausea or emesis. Tolerating full liquid diet.  Objective: Vital signs in last 24 hours: Temp:  [98.7 F (37.1 C)-99.6 F (37.6 C)] 99.4 F (37.4 C) (06/23 0541) Pulse Rate:  [60-79] 63 (06/23 0541) Resp:  [18] 18 (06/23 0541) BP: (138-171)/(83-88) 156/85 (06/23 0541) SpO2:  [93 %-98 %] 93 % (06/23 0541) Last BM Date: 12/18/20  Intake/Output from previous day: 06/22 0701 - 06/23 0700 In: 120 [P.O.:120] Out: -  Intake/Output this shift: No intake/output data recorded.  PE: General: pleasant, WD, female who is laying in bed in NAD HEENT: head is normocephalic, atraumatic.  Mouth is pink and moist Heart: regular, rate, and rhythm.  Palpable radial pulses  Lungs: Respiratory effort nonlabored on supplemental O2 Abd: soft, NT, minimal distension, +BS, no masses, hernias, or organomegaly MS: all 4 extremities are symmetrical with no cyanosis, clubbing, or edema. Skin: warm and dry with no masses, lesions, or rashes Psych: A&Ox3 with an appropriate affect.    Lab Results:  Recent Labs    12/16/20 0928 12/18/20 0458  WBC 7.8 8.8  HGB 13.5 11.6*  HCT 39.9 34.1*  PLT 250 272   BMET Recent Labs    12/18/20 0458 12/19/20 0704  NA 140 138  K 3.6 3.8  CL 103 104  CO2 28 25  GLUCOSE 104* 103*  BUN 5* <5*  CREATININE 0.60 0.47  CALCIUM 7.9* 8.4*   PT/INR No results for input(s): LABPROT, INR in the last 72 hours. CMP     Component Value Date/Time   NA 138 12/19/2020 0704   NA 143 11/26/2017 1159   NA 143 06/16/2017 0921   K 3.8 12/19/2020 0704   K 3.8 06/16/2017 0921   CL 104 12/19/2020 0704   CL 109 (H) 12/14/2012 1009   CO2 25 12/19/2020 0704   CO2 28 06/16/2017 0921   GLUCOSE 103 (H) 12/19/2020 0704   GLUCOSE 69 (L) 06/16/2017 0921   GLUCOSE 64  (L) 12/14/2012 1009   BUN <5 (L) 12/19/2020 0704   BUN 18 11/26/2017 1159   BUN 11.7 06/16/2017 0921   CREATININE 0.47 12/19/2020 0704   CREATININE 0.80 06/14/2020 1335   CREATININE 0.8 06/16/2017 0921   CALCIUM 8.4 (L) 12/19/2020 0704   CALCIUM 9.7 06/16/2017 0921   PROT 7.9 12/10/2020 0304   PROT 7.5 06/16/2017 0921   ALBUMIN 4.9 12/10/2020 0304   ALBUMIN 3.9 06/16/2017 0921   AST 24 12/10/2020 0304   AST 28 06/14/2020 1335   AST 23 06/16/2017 0921   ALT 16 12/10/2020 0304   ALT 19 06/14/2020 1335   ALT 18 06/16/2017 0921   ALKPHOS 64 12/10/2020 0304   ALKPHOS 63 06/16/2017 0921   BILITOT 0.6 12/10/2020 0304   BILITOT 0.6 06/14/2020 1335   BILITOT 0.52 06/16/2017 0921   GFRNONAA >60 12/19/2020 0704   GFRNONAA >60 06/14/2020 1335   GFRNONAA >60 10/16/2010 1013   GFRAA >60 06/16/2019 1030   GFRAA >60 10/16/2010 1013   Lipase     Component Value Date/Time   LIPASE 19 12/10/2020 0304       Studies/Results: CT ABDOMEN PELVIS W CONTRAST  Result Date: 12/17/2020 CLINICAL DATA:  Abdominal pain, recent small-bowel obstruction, suspected obstruction EXAM: CT ABDOMEN AND PELVIS WITH CONTRAST TECHNIQUE: Multidetector CT imaging  of the abdomen and pelvis was performed using the standard protocol following bolus administration of intravenous contrast. Sagittal and coronal MPR images reconstructed from axial data set. CONTRAST:  150mL OMNIPAQUE IOHEXOL 300 MG/ML SOLN IV. Dilute oral contrast. COMPARISON:  12/10/2020 FINDINGS: Lower chest: BILATERAL pleural effusions in significant lower lobe atelectasis. Enlargement of cardiac chambers. Pacemaker leads RIGHT atrium and RIGHT ventricle. Small pericardial effusion. Hepatobiliary: Tiny RIGHT lobe liver cyst superiorly, stable. Gallbladder and liver otherwise normal appearance. Pancreas: Normal appearance Spleen: Normal appearance Adrenals/Urinary Tract: Adrenal glands, kidneys, ureters, and bladder normal appearance Stomach/Bowel: Stomach  decompressed. Normal appendix. Diverticulosis of sigmoid colon without evidence of diverticulitis. Remainder of colon and terminal ileum decompressed. Proximal small bowel loops are dilated. Transition from dilated to nondilated small bowel loops occurs in the upper RIGHT pelvis. Mild swirling of the mesentery is seen. This could be due to adhesion or an internal hernia. No bowel wall thickening or perforation. Vascular/Lymphatic: Atherosclerotic calcifications aorta and iliac arteries. Aorta normal caliber. Scattered pelvic phleboliths. No adenopathy. Reproductive: Uterus surgically absent. Nonvisualization of ovaries. Other: Small amount of free fluid in pelvis. No free air or additional hernia. Musculoskeletal: Osseous demineralization. Degenerative changes and scoliosis of lumbar spine. IMPRESSION: Small-bowel obstruction with transition from dilated to nondilated small bowel loops in the upper RIGHT pelvis, question due to adhesion or an internal hernia. Small amount of nonspecific free fluid in pelvis. BILATERAL pleural effusions and significant lower lobe atelectasis. Enlargement of cardiac chambers with small pericardial effusion. Sigmoid diverticulosis without evidence of diverticulitis. Aortic Atherosclerosis (ICD10-I70.0). Electronically Signed   By: Lavonia Dana M.D.   On: 12/17/2020 16:39   ECHOCARDIOGRAM COMPLETE  Result Date: 12/18/2020    ECHOCARDIOGRAM REPORT   Patient Name:   [name] Date of Exam: 12/18/2020 Medical Rec #:  [id number]         Height:       63.0 in Accession #:    [id number]        Weight:       140.0 lb Date of Birth:  October 04, 1934         BSA:          1.662 m Patient Age:   86 years          BP:           135/71 mmHg Patient Gender: F                 HR:           62 bpm. Exam Location:  Inpatient Procedure: 2D Echo, Color Doppler and Cardiac Doppler Indications:    Pericardial effusion  History:        Patient has prior history of Echocardiogram examinations, most                  recent 12/18/2014. Pacemaker; Risk Factors:Hypertension.  Sonographer:    Cammy Brochure Referring Phys: [id number] KELLY A GRIFFITH IMPRESSIONS  1. Left ventricular ejection fraction, by estimation, is 60 to 65%. The left ventricle has normal function. The left ventricle has no regional wall motion abnormalities. There is mild left ventricular hypertrophy of the septal segment. Left ventricular diastolic parameters are consistent with Grade II diastolic dysfunction (pseudonormalization). Elevated left ventricular end-diastolic pressure.  2. Right ventricular systolic function is normal. The right ventricular size is normal. There is moderately elevated pulmonary artery systolic pressure.  3. Left atrial size was moderately dilated.  4. There is no evidence of cardiac tamponade.  5. The mitral valve is normal in structure. No evidence of mitral valve regurgitation. No evidence of mitral stenosis.  6. Tricuspid valve regurgitation is mild to moderate.  7. The aortic valve is tricuspid. There is mild calcification of the aortic valve. There is mild thickening of the aortic valve. Aortic valve regurgitation is not visualized. No aortic stenosis is present.  8. The inferior vena cava is normal in size with greater than 50% respiratory variability, suggesting right atrial pressure of 3 mmHg. FINDINGS  Left Ventricle: Left ventricular ejection fraction, by estimation, is 60 to 65%. The left ventricle has normal function. The left ventricle has no regional wall motion abnormalities. The left ventricular internal cavity size was normal in size. There is  mild left ventricular hypertrophy of the septal segment. Left ventricular diastolic parameters are consistent with Grade II diastolic dysfunction (pseudonormalization). Elevated left ventricular end-diastolic pressure. Right Ventricle: The right ventricular size is normal. No increase in right ventricular wall thickness. Right ventricular systolic function is  normal. There is moderately elevated pulmonary artery systolic pressure. The tricuspid regurgitant velocity is 3.75 m/s, and with an assumed right atrial pressure of 3 mmHg, the estimated right ventricular systolic pressure is 58.5 mmHg. Left Atrium: Left atrial size was moderately dilated. Right Atrium: Right atrial size was normal in size. Pericardium: Trivial pericardial effusion is present. There is no evidence of cardiac tamponade. Mitral Valve: The mitral valve is normal in structure. Mild mitral annular calcification. No evidence of mitral valve regurgitation. No evidence of mitral valve stenosis. Tricuspid Valve: The tricuspid valve is normal in structure. Tricuspid valve regurgitation is mild to moderate. No evidence of tricuspid stenosis. Aortic Valve: The aortic valve is tricuspid. There is mild calcification of the aortic valve. There is mild thickening of the aortic valve. There is mild aortic valve annular calcification. Aortic valve regurgitation is not visualized. No aortic stenosis  is present. Aortic valve mean gradient measures 6.0 mmHg. Aortic valve peak gradient measures 10.9 mmHg. Aortic valve area, by VTI measures 2.99 cm. Pulmonic Valve: The pulmonic valve was normal in structure. Pulmonic valve regurgitation is not visualized. No evidence of pulmonic stenosis. Aorta: The aortic root is normal in size and structure. Venous: The inferior vena cava is normal in size with greater than 50% respiratory variability, suggesting right atrial pressure of 3 mmHg. IAS/Shunts: No atrial level shunt detected by color flow Doppler. Additional Comments: A device lead is visualized.  LEFT VENTRICLE PLAX 2D LVIDd:         3.90 cm  Diastology LVIDs:         2.20 cm  LV e' medial:    4.24 cm/s LV PW:         1.00 cm  LV E/e' medial:  28.8 LV IVS:        1.10 cm  LV e' lateral:   5.87 cm/s LVOT diam:     2.10 cm  LV E/e' lateral: 20.8 LV SV:         106 LV SV Index:   64 LVOT Area:     3.46 cm  RIGHT VENTRICLE           IVC RV Basal diam:  3.90 cm  IVC diam: 1.40 cm LEFT ATRIUM             Index       RIGHT ATRIUM           Index LA diam:        3.80 cm  2.29 cm/m  RA Area:     11.70 cm LA Vol (A2C):   57.1 ml 34.36 ml/m RA Volume:   21.60 ml  13.00 ml/m LA Vol (A4C):   59.8 ml 35.99 ml/m LA Biplane Vol: 60.3 ml 36.29 ml/m  AORTIC VALVE AV Area (Vmax):    3.15 cm AV Area (Vmean):   2.96 cm AV Area (VTI):     2.99 cm AV Vmax:           165.00 cm/s AV Vmean:          110.000 cm/s AV VTI:            0.356 m AV Peak Grad:      10.9 mmHg AV Mean Grad:      6.0 mmHg LVOT Vmax:         150.00 cm/s LVOT Vmean:        94.000 cm/s LVOT VTI:          0.307 m LVOT/AV VTI ratio: 0.86  AORTA Ao Root diam: 3.50 cm Ao Asc diam:  3.40 cm MITRAL VALVE                TRICUSPID VALVE MV Area (PHT): 3.60 cm     TR Peak grad:   56.2 mmHg MV Decel Time: 211 msec     TR Vmax:        375.00 cm/s MV E velocity: 122.00 cm/s MV A velocity: 105.00 cm/s  SHUNTS MV E/A ratio:  1.16         Systemic VTI:  0.31 m                             Systemic Diam: 2.10 cm Skeet Latch MD Electronically signed by Skeet Latch MD Signature Date/Time: 12/18/2020/2:37:46 PM    Final     Anti-infectives: Anti-infectives (From admission, onward)    Start     Dose/Rate Route Frequency Ordered Stop   12/17/20 0930  cefTRIAXone (ROCEPHIN) 2 g in sodium chloride 0.9 % 100 mL IVPB  Status:  Discontinued        2 g 200 mL/hr over 30 Minutes Intravenous Every 24 hours 12/17/20 0840 12/17/20 1650   12/17/20 0915  cefTRIAXone (ROCEPHIN) 1 g in sodium chloride 0.9 % 100 mL IVPB  Status:  Discontinued        1 g 200 mL/hr over 30 Minutes Intravenous Every 24 hours 12/17/20 0819 12/17/20 0840   12/17/20 0845  metroNIDAZOLE (FLAGYL) IVPB 500 mg  Status:  Discontinued        500 mg 100 mL/hr over 60 Minutes Intravenous Every 8 hours 12/17/20 0819 12/17/20 1650        Assessment/Plan  SBO with history of common variable immunodeficiency as well as  prior abdominal surgery   -She has had resolution of all symptoms.  - CT 6/21 with SBO and transition point. Dr. Dema Severin discussed surgical options yesterday with patient and daughter and they wished to proceed with diet trial. She is tolerating FLD well and symptoms remain resolved - No acute surgical intervention at this time. Can advance as tolerated to soft diet   FEN: FLD, ADAT soft ID: none VTE: SCDs, lovenox   Per primary team: Hiatal Hernia HTN Hypothyroidism A.fib Permanent pacemaker placement 2013 PAD NHL in remission Common Variable immunodeficiency    No surgical intervention planned at this time - we will sign off. Please do not hesitate to  contact us/reconsult as needed      LOS: 9 days    Winferd Humphrey, [facility] 12/19/2020, 9:07 AM Please see Amion for pager number during day hours 7:00am-4:30pm

## 2020-12-19 NOTE — Progress Notes (Signed)
Patient stated that she did not sleep last night and she thinks it was due to Robitussin she stated that she has had this reaction before. IS was used and she got it 1000 encouraged [place] along with flutter valve to exercise her lungs. Gave hot tea to help ease her coughing and will continue to monitor. Arthor Captain LPN

## 2020-12-19 NOTE — Progress Notes (Signed)
Discharge teaching complete. Meds, diet, activity, follow up appointments reviewed and all questions answered. Copy of instructions given to patient and prescription sent to pharmacy. Daughter present during education. Patient discharged home via wheelchair with daughter.

## 2020-12-19 NOTE — Care Management Important Message (Signed)
Important Message  Patient Details  Name: [name] MRN: [id number] Date of Birth: 07/13/1934   Medicare Important Message Given:  Yes     [name] [place] 12/19/2020, 3:14 PM

## 2020-12-20 ENCOUNTER — Other Ambulatory Visit: Payer: Self-pay | Admitting: Internal Medicine

## 2020-12-20 ENCOUNTER — Encounter: Payer: Self-pay | Admitting: Internal Medicine

## 2020-12-20 DIAGNOSIS — K591 Functional diarrhea: Secondary | ICD-10-CM

## 2020-12-20 MED ORDER — DIPHENOXYLATE-ATROPINE 2.5-0.025 MG PO TABS: 1.0000 | ORAL_TABLET | Freq: Two times a day (BID) | ORAL | 2 refills | Status: DC | PRN

## 2020-12-21 LAB — STOOL CULTURE REFLEX - RSASHR

## 2020-12-21 LAB — STOOL CULTURE: E coli, Shiga toxin Assay: NEGATIVE

## 2020-12-21 LAB — STOOL CULTURE REFLEX - CMPCXR

## 2020-12-24 ENCOUNTER — Ambulatory Visit: Payer: Medicare Other | Admitting: Vascular Surgery

## 2020-12-24 ENCOUNTER — Ambulatory Visit (HOSPITAL_COMMUNITY): Payer: Medicare Other

## 2021-01-02 ENCOUNTER — Ambulatory Visit: Payer: Medicare Other | Admitting: Neurology

## 2021-01-08 DIAGNOSIS — D839 Common variable immunodeficiency, unspecified: Secondary | ICD-10-CM | POA: Diagnosis not present

## 2021-01-15 DIAGNOSIS — D839 Common variable immunodeficiency, unspecified: Secondary | ICD-10-CM | POA: Diagnosis not present

## 2021-01-22 DIAGNOSIS — D839 Common variable immunodeficiency, unspecified: Secondary | ICD-10-CM | POA: Diagnosis not present

## 2021-01-29 DIAGNOSIS — D839 Common variable immunodeficiency, unspecified: Secondary | ICD-10-CM | POA: Diagnosis not present

## 2021-02-06 DIAGNOSIS — D839 Common variable immunodeficiency, unspecified: Secondary | ICD-10-CM | POA: Diagnosis not present

## 2021-02-07 ENCOUNTER — Ambulatory Visit: Payer: Medicare Other | Admitting: Neurology

## 2021-02-12 ENCOUNTER — Ambulatory Visit (INDEPENDENT_AMBULATORY_CARE_PROVIDER_SITE_OTHER): Payer: Medicare Other

## 2021-02-12 DIAGNOSIS — I495 Sick sinus syndrome: Secondary | ICD-10-CM

## 2021-02-13 DIAGNOSIS — D839 Common variable immunodeficiency, unspecified: Secondary | ICD-10-CM | POA: Diagnosis not present

## 2021-02-13 LAB — CUP PACEART REMOTE DEVICE CHECK
Battery Voltage: 2.85 V
Brady Statistic RA Percent Paced: 91.07 %
Date Time Interrogation Session: 20220817131919
Implantable Lead Implant Date: 20130319
Implantable Lead Implant Date: 20130319
Implantable Lead Location: 753859
Implantable Lead Location: 753860
Implantable Pulse Generator Implant Date: 20130319
Lead Channel Impedance Value: 416 Ohm
Lead Channel Impedance Value: 480 Ohm
Lead Channel Sensing Intrinsic Amplitude: 1.818 mV
Lead Channel Sensing Intrinsic Amplitude: 16.715 mV
Lead Channel Setting Pacing Amplitude: 2 V
Lead Channel Setting Sensing Sensitivity: 0.9 mV

## 2021-02-20 DIAGNOSIS — D839 Common variable immunodeficiency, unspecified: Secondary | ICD-10-CM | POA: Diagnosis not present

## 2021-02-26 ENCOUNTER — Ambulatory Visit
Admission: RE | Admit: 2021-02-26 | Discharge: 2021-02-26 | Disposition: A | Discharge status: Home / Self Care | Payer: Medicare Other | Source: Ambulatory Visit | Attending: Obstetrics and Gynecology | Admitting: Obstetrics and Gynecology

## 2021-02-26 ENCOUNTER — Other Ambulatory Visit: Payer: Self-pay | Admitting: Obstetrics and Gynecology

## 2021-02-26 ENCOUNTER — Other Ambulatory Visit: Payer: Self-pay

## 2021-02-26 DIAGNOSIS — N63 Unspecified lump in unspecified breast: Secondary | ICD-10-CM

## 2021-02-26 DIAGNOSIS — R922 Inconclusive mammogram: Secondary | ICD-10-CM | POA: Diagnosis not present

## 2021-02-27 DIAGNOSIS — D839 Common variable immunodeficiency, unspecified: Secondary | ICD-10-CM | POA: Diagnosis not present

## 2021-03-03 NOTE — Progress Notes (Signed)
\   ASSESSMENT & PLAN:  86 y.o. female with bilateral lower extremity intermittent claudication.   Recommend the following which can slow the progression of atherosclerosis and reduce the risk of major adverse cardiac / limb events:  Complete cessation from all tobacco products. Blood glucose control with goal A1c < 7%. Blood pressure control with goal blood pressure < 140/90 mmHg. Lipid reduction therapy with goal LDL-C <100 mg/dL (<70 if symptomatic from PAD).  Aspirin '81mg'$  PO QD.  Atorvastatin 40-'80mg'$  PO QD (or other "high intensity" statin therapy). Encouraged to walk daily.  Encouraged to stay hydrated.  Return to care in 6 months with repeat ABI.  CHIEF COMPLAINT:   Lower extremity cramping pain.  HISTORY:  HISTORY OF PRESENT ILLNESS: [name] is a 86 y.o. female for the clinic for evaluation of lower extremity discomfort.  She reports reproducible pain in her calves with ambulation.  She can only ambulate to the mailbox before pain starts.  She also reports periodic cramping discomfort in her calves and feet at rest.  She has coolness and discoloration of her toes.  This does not appear related to temperature or seasonal changes.  Reports no symptoms consistent with ischemic rest pain.  She has no symptoms consistent with ischemic ulceration.  03/04/21: Patient returns to clinic for follow-up.  She has no real change in her symptoms.  She still has atypical discomfort in her legs (cold sensation, occasional discomfort at rest).  She has claudication symptoms which began after getting to about her mailbox.  No ulceration about her feet  Past Medical History:  Diagnosis Date   Anemia    pt denies   Arthritis    Atrial fibrillation (HCC)    Cataract    bil   Common variable immunodeficiency (HCC)    Cystocele    history with rectocele, pt denies   GERD (gastroesophageal reflux disease)    H/O hiatal hernia    Hypertension    Hypothyroidism    Non Hodgkin's  lymphoma ([place])    dx 2012 and now in remission   Osteoporosis    Pacemaker 09/15/2011   Medtronic Revo   Pericarditis    Pleural effusion, bilateral    history of   Pulmonary nodules    history of   Scoliosis    Varicose vein     Past Surgical History:  Procedure Laterality Date   BREAST SURGERY  07/30/01   biopsy x 3, bilateral   CATARACT EXTRACTION Bilateral    COLONOSCOPY     ESOPHAGOGASTRODUODENOSCOPY (EGD) WITH PROPOFOL N/A 12/10/2020   Procedure: ESOPHAGOGASTRODUODENOSCOPY (EGD) WITH PROPOFOL;  Surgeon: Ronnette Juniper, MD;  Location: [facility];  Service: Gastroenterology;  Laterality: N/A;  NG Tube Insertion   MASS EXCISION Right 10/28/2012   Procedure: Removal of mass on neck       ;  Surgeon: Adin Hector, MD;  Location: [facility];  Service: General;  Laterality: Right;   PARTIAL HYSTERECTOMY  04/29/2004   PERMANENT PACEMAKER INSERTION N/A 09/15/2011   Procedure: PERMANENT PACEMAKER INSERTION;  Surgeon: Sanda Klein, MD;  Location: [facility];  Service: Cardiovascular;  Laterality: N/A;   RADIOACTIVE SEED GUIDED EXCISIONAL BREAST BIOPSY Left 08/11/2017   Procedure: LEFT RADIOACTIVE SEED GUIDED EXCISIONAL BREAST BIOPSY ERAS PATHWAY;  Surgeon: Stark Klein, MD;  Location: [facility];  Service: General;  Laterality: Left;   TONSILLECTOMY      Family History  Problem Relation Age of Onset   Stroke Mother    Stroke  Father    Heart disease Daughter        congenital "whole in her heart"   Lymphoma Daughter        nonhodgkins   Breast cancer Sister    Heart disease Brother    Colon cancer Neg Hx    Esophageal cancer Neg Hx    Pancreatic cancer Neg Hx    Stomach cancer Neg Hx    Liver disease Neg Hx     Social History   Socioeconomic History   Marital status: Widowed    Spouse name: Not on file   Number of children: 3   Years of education: Not on file   Highest education level: Not on file  Occupational History   Occupation: [employer]  Tobacco Use   Smoking  status: Never   Smokeless tobacco: Never  Vaping Use   Vaping Use: Never used  Substance and Sexual Activity   Alcohol use: Yes    Comment: 1 glass of wine occasionally   Drug use: No   Sexual activity: Not on file  Other Topics Concern   Not on file  Social History Narrative   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  She lives alone in a 3-story home.  There is a lift chair which she does not use (placed for her husband).   Social Determinants of Health   Financial Resource Strain: Not on file  Food Insecurity: Not on file  Transportation Needs: Not on file  Physical Activity: Not on file  Stress: Not on file  Social Connections: Not on file  Intimate Partner Violence: Not on file    Allergies  Allergen Reactions   Amoxicillin Hives    Has patient had a PCN reaction causing immediate rash, facial/tongue/throat swelling, SOB or lightheadedness with hypotension: No Has patient had a PCN reaction causing severe rash involving mucus membranes or skin necrosis: No Has patient had a PCN reaction that required hospitalization: No Has patient had a PCN reaction occurring within the last 10 years: No If all of the above answers are "NO", then may proceed with Cephalosporin use.    Buprenorphine Hives   Codeine Hives and Other (See Comments)    "makes her crazy"    Demerol Hives   Dextromethorphan-Guaifenesin     insomnia   Morphine And Related Hives   Sulfonamide Derivatives Rash   Vicodin [Hydrocodone-Acetaminophen] Hives    Current Outpatient Medications  Medication Sig Dispense Refill   acetaminophen (TYLENOL) 500 MG tablet Take 500 mg by mouth every 6 (six) hours as needed (for pain.).      Calcium Carbonate-Vitamin D 600-400 MG-UNIT tablet Take 1 tablet by mouth 2 (two) times daily.     denosumab (PROLIA) 60 MG/ML SOLN injection Inject 60 mg into the skin every 6 (six) months. Reported on 10/16/2015      diphenoxylate-atropine (LOMOTIL) 2.5-0.025 MG tablet Take 1 tablet by mouth 2 (two) times daily as needed for diarrhea or loose stools. 60 tablet 2   EPINEPHrine 0.3 mg/0.3 mL IJ SOAJ injection Inject 0.3 mg into the muscle as needed for anaphylaxis.     ferrous fumarate (HEMOCYTE - 106 MG FE) 325 (106 Fe) MG TABS tablet Take 1 tablet by mouth daily.     gabapentin (NEURONTIN) 300 MG capsule Take 1 capsule (300 mg total) by mouth 4 (four) times daily. 360 capsule 1   Immune Globulin, Human, (HIZENTRA) 4 GM/20ML SOLN Inject 8 g into the skin  once a week.     levothyroxine (SYNTHROID) 100 MCG tablet Take 1 tablet (100 mcg total) by mouth daily. 90 tablet 1   lisinopril (ZESTRIL) 20 MG tablet Take 1 tablet (20 mg total) by mouth in the morning and at bedtime. 180 tablet 1   Magnesium Oxide (MAG-OX 400 PO) Take 1 tablet by mouth daily.     metoprolol tartrate (LOPRESSOR) 25 MG tablet Take 0.5 tablets (12.5 mg total) by mouth 2 (two) times daily.     olopatadine (PATANOL) 0.1 % ophthalmic solution Place 1 drop into both eyes 2 times daily. 5 mL 1   omeprazole (PRILOSEC) 40 MG capsule TAKE 1 CAPSULE(40 MG) BY MOUTH DAILY 90 capsule 3   ondansetron (ZOFRAN-ODT) 4 MG disintegrating tablet Take 1 tablet (4 mg total) by mouth every 6 (six) hours as needed for nausea. 15 tablet 0   potassium chloride (KLOR-CON) 10 MEQ tablet TAKE 1 TABLET(10 MEQ) BY MOUTH DAILY 90 tablet 0   No current facility-administered medications for this visit.    REVIEW OF SYSTEMS:  '[X]'$  denotes positive finding, '[ ]'$  denotes negative finding Cardiac  Comments:  Chest pain or chest pressure:    Shortness of breath upon exertion:    Short of breath when lying flat:    Irregular heart rhythm:        Vascular    Pain in calf, thigh, or hip brought on by ambulation: x   Pain in feet at night that wakes you up from your sleep:  x   Blood clot in your veins:    Leg swelling:         Pulmonary    Oxygen at home:    Productive  cough:     Wheezing:         Neurologic    Sudden weakness in arms or legs:     Sudden numbness in arms or legs:     Sudden onset of difficulty speaking or slurred speech:    Temporary loss of vision in one eye:     Problems with dizziness:         Gastrointestinal    Blood in stool:     Vomited blood:         Genitourinary    Burning when urinating:     Blood in urine:        Psychiatric    Major depression:         Hematologic    Bleeding problems:    Problems with blood clotting too easily:        Skin    Rashes or ulcers:        Constitutional    Fever or chills:     PHYSICAL EXAM:   Vitals:   03/04/21 1418  BP: 107/67  Pulse: 68  Resp: 20  Temp: 98.6 F (37 C)  SpO2: 97%  Weight: 134 lb (60.8 kg)  Height: '5\' 3"'$  (1.6 m)    Constitutional: Well appearing in no distress. Appears well nourished.  Neurologic: Normal gait and station. CN intact. No weakness. No sensory loss. Psychiatric: Mood and affect symmetric and appropriate. Eyes: No icterus. No conjunctival pallor. Ears, nose, throat: mucous membranes moist. Midline trachea.  Cardiac: regular rate and rhythm.  Respiratory: unlabored. Abdominal: soft, non-tender, non-distended. No palpable pulsatile abdominal mass. Peripheral vascular:  Non-palpable pedal pulses Extremity: No edema. No cyanosis. No pallor.  Skin: No gangrene. No ulceration.  Lymphatic: No Stemmer's sign. No palpable lymphadenopathy.  DATA REVIEW:    Most recent CBC CBC Latest Ref Rng & Units 12/18/2020 12/16/2020 12/14/2020  WBC 4.0 - 10.5 K/uL 8.8 7.8 9.8  Hemoglobin 12.0 - 15.0 g/dL 11.6(L) 13.5 13.0  Hematocrit 36.0 - 46.0 % 34.1(L) 39.9 39.8  Platelets 150 - 400 K/uL 272 250 225     Most recent CMP CMP Latest Ref Rng & Units 12/19/2020 12/18/2020 12/16/2020  Glucose 70 - 99 mg/dL 103(H) 104(H) 119(H)  BUN 8 - 23 mg/dL <5(L) 5(L) 9  Creatinine 0.44 - 1.00 mg/dL 0.47 0.60 0.48  Sodium 135 - 145 mmol/L 138 140 140  Potassium  3.5 - 5.1 mmol/L 3.8 3.6 3.1(L)  Chloride 98 - 111 mmol/L 104 103 108  CO2 22 - 32 mmol/L '25 28 27  '$ Calcium 8.9 - 10.3 mg/dL 8.4(L) 7.9(L) 8.3(L)  Total Protein 6.5 - 8.1 g/dL - - -  Total Bilirubin 0.3 - 1.2 mg/dL - - -  Alkaline Phos 38 - 126 U/L - - -  AST 15 - 41 U/L - - -  ALT 0 - 44 U/L - - -   LOWER EXTREMITY DOPPLER STUDY   Patient Name:  [name]  Date of Exam:   03/04/2021  Medical Rec #: [id number]          Accession #:    [id number]  Date of Birth: 1935/04/03          Patient Gender: F  Patient Age:   11 years  Exam Location:  [facility]  Procedure:      VAS [place] ABI WITH/WO TBI  Referring Phys: Marcello Moores Nahomy [place]    ---------------------------------------------------------------------------  -----     Indications: Claudication, and toe discoloration.   High Risk Factors: Hypertension.   Other Factors: Bilateral lower extremity intermittent claudication. Her  symptoms                 are a bit atypical, but she does report reproducible  discomfort                 in her calves when she walks.   Comparison Study: 06/25/20   Performing Technologist: June Leap RDMS, RVT      Examination Guidelines: A complete evaluation includes at minimum, Doppler  waveform signals and systolic blood pressure reading at the level of  bilateral  brachial, anterior tibial, and posterior tibial arteries, when vessel  segments  are accessible. Bilateral testing is considered an integral part of a  complete  examination. Photoelectric Plethysmograph (PPG) waveforms and toe systolic  pressure readings are included as required and additional duplex testing  as  needed. Limited examinations for reoccurring indications may be performed  as  noted.      ABI Findings:  +---------+------------------+-----+--------+--------+  Right    Rt Pressure (mmHg)IndexWaveformComment   +---------+------------------+-----+--------+--------+  Brachial 109                                       +---------+------------------+-----+--------+--------+  PTA      95                0.87 biphasic          +---------+------------------+-----+--------+--------+  DP       97                0.89 biphasic          +---------+------------------+-----+--------+--------+  [place]  Toe63                0.58 Abnormal          +---------+------------------+-----+--------+--------+   +---------+------------------+-----+--------+-------+  Left     Lt Pressure (mmHg)IndexWaveformComment  +---------+------------------+-----+--------+-------+  Brachial 103                                     +---------+------------------+-----+--------+-------+  PTA      102               0.94 biphasic         +---------+------------------+-----+--------+-------+  DP       99                0.91 biphasic         +---------+------------------+-----+--------+-------+  Great Toe66                0.61 Abnormal         +---------+------------------+-----+--------+-------+   +-------+-----------+-----------+------------+------------+  ABI/TBIToday's ABIToday's TBIPrevious ABIPrevious TBI  +-------+-----------+-----------+------------+------------+  Right  0.87       0.58       0.98        0.71          +-------+-----------+-----------+------------+------------+  Left   0.94       0.61       0.94        0.61          +-------+-----------+-----------+------------+------------+             Summary:  Right: Resting right ankle-brachial index indicates mild right lower  extremity arterial disease. The right toe-brachial index is abnormal.   Left: Resting left ankle-brachial index indicates mild left lower  extremity arterial disease. The left toe-brachial index is abnormal.       *See table(s) above for measurements and observations.       Electronically signed by Jamelle Haring on 03/04/2021 at 2:25:52 PM.  Yevonne Aline.  Stanford Breed, MD [facility] Phone Number: [phone] 03/04/2021 2:43 PM

## 2021-03-04 ENCOUNTER — Other Ambulatory Visit: Payer: Self-pay

## 2021-03-04 ENCOUNTER — Ambulatory Visit (HOSPITAL_COMMUNITY)
Admission: RE | Admit: 2021-03-04 | Discharge: 2021-03-04 | Disposition: A | Discharge status: Home / Self Care | Payer: Medicare Other | Source: Ambulatory Visit | Attending: Vascular Surgery | Admitting: Vascular Surgery

## 2021-03-04 ENCOUNTER — Ambulatory Visit (INDEPENDENT_AMBULATORY_CARE_PROVIDER_SITE_OTHER): Payer: Medicare Other | Admitting: Vascular Surgery

## 2021-03-04 ENCOUNTER — Encounter: Payer: Self-pay | Admitting: Vascular Surgery

## 2021-03-04 VITALS — BP 107/67 | HR 68 | Temp 98.6°F | Resp 20 | Ht 63.0 in | Wt 134.0 lb

## 2021-03-04 DIAGNOSIS — I70213 Atherosclerosis of native arteries of extremities with intermittent claudication, bilateral legs: Secondary | ICD-10-CM | POA: Insufficient documentation

## 2021-03-04 DIAGNOSIS — I739 Peripheral vascular disease, unspecified: Secondary | ICD-10-CM | POA: Diagnosis not present

## 2021-03-04 DIAGNOSIS — D839 Common variable immunodeficiency, unspecified: Secondary | ICD-10-CM | POA: Diagnosis not present

## 2021-03-04 NOTE — Progress Notes (Signed)
Remote pacemaker transmission.   

## 2021-03-05 ENCOUNTER — Other Ambulatory Visit: Payer: Self-pay

## 2021-03-05 DIAGNOSIS — I739 Peripheral vascular disease, unspecified: Secondary | ICD-10-CM

## 2021-03-05 NOTE — Progress Notes (Signed)
error 

## 2021-03-07 ENCOUNTER — Other Ambulatory Visit: Payer: Self-pay | Admitting: Internal Medicine

## 2021-03-07 DIAGNOSIS — I10 Essential (primary) hypertension: Secondary | ICD-10-CM

## 2021-03-10 ENCOUNTER — Ambulatory Visit: Payer: Medicare Other | Admitting: Neurology

## 2021-03-11 DIAGNOSIS — D839 Common variable immunodeficiency, unspecified: Secondary | ICD-10-CM | POA: Diagnosis not present

## 2021-03-13 ENCOUNTER — Ambulatory Visit: Payer: Medicare Other | Admitting: Neurology

## 2021-03-18 DIAGNOSIS — D839 Common variable immunodeficiency, unspecified: Secondary | ICD-10-CM | POA: Diagnosis not present

## 2021-03-25 DIAGNOSIS — D839 Common variable immunodeficiency, unspecified: Secondary | ICD-10-CM | POA: Diagnosis not present

## 2021-03-27 ENCOUNTER — Telehealth: Payer: Self-pay

## 2021-03-27 NOTE — Telephone Encounter (Signed)
Prolia VOB initiated via [email or web address]  Last OV:  Next OV:  Last Prolia inj: 08/01/20 Next Prolia inj DUE: 01/30/21

## 2021-04-01 DIAGNOSIS — D839 Common variable immunodeficiency, unspecified: Secondary | ICD-10-CM | POA: Diagnosis not present

## 2021-04-02 NOTE — Telephone Encounter (Signed)
Prior Authorization required for Prolia  PA PROCESS DETAILS: Please complete the prior authorization form located at [email or web address] or call [phone]

## 2021-04-08 ENCOUNTER — Other Ambulatory Visit: Payer: Self-pay | Admitting: Internal Medicine

## 2021-04-08 ENCOUNTER — Other Ambulatory Visit: Payer: Self-pay | Admitting: Family

## 2021-04-08 DIAGNOSIS — E039 Hypothyroidism, unspecified: Secondary | ICD-10-CM

## 2021-04-08 DIAGNOSIS — D839 Common variable immunodeficiency, unspecified: Secondary | ICD-10-CM | POA: Diagnosis not present

## 2021-04-14 NOTE — Telephone Encounter (Signed)
Pt ready for scheduling on or after 01/30/21  Out-of-pocket cost due at time of visit: $300  Primary: [employer] Prolia co-insurance: 20% (approximately $270) Admin fee co-insurance: $30  Secondary: n/a Prolia co-insurance:  Admin fee co-insurance:   Deductible: does not apply  Prior Auth: APPROVED PA# [id number] Valid: 04/14/21-04/14/22  ** This summary of benefits is an estimation of the patient's out-of-pocket cost. Exact cost may vary based on individual plan coverage.

## 2021-04-15 DIAGNOSIS — D839 Common variable immunodeficiency, unspecified: Secondary | ICD-10-CM | POA: Diagnosis not present

## 2021-04-22 DIAGNOSIS — D839 Common variable immunodeficiency, unspecified: Secondary | ICD-10-CM | POA: Diagnosis not present

## 2021-04-28 DIAGNOSIS — D839 Common variable immunodeficiency, unspecified: Secondary | ICD-10-CM | POA: Diagnosis not present

## 2021-05-05 DIAGNOSIS — D839 Common variable immunodeficiency, unspecified: Secondary | ICD-10-CM | POA: Diagnosis not present

## 2021-05-12 DIAGNOSIS — D839 Common variable immunodeficiency, unspecified: Secondary | ICD-10-CM | POA: Diagnosis not present

## 2021-05-14 ENCOUNTER — Ambulatory Visit (INDEPENDENT_AMBULATORY_CARE_PROVIDER_SITE_OTHER): Payer: Medicare Other

## 2021-05-14 DIAGNOSIS — I495 Sick sinus syndrome: Secondary | ICD-10-CM | POA: Diagnosis not present

## 2021-05-15 LAB — CUP PACEART REMOTE DEVICE CHECK
Battery Voltage: 2.83 V
Brady Statistic RA Percent Paced: 98.29 %
Date Time Interrogation Session: 20221117153834
Implantable Lead Implant Date: 20130319
Implantable Lead Implant Date: 20130319
Implantable Lead Location: 753859
Implantable Lead Location: 753860
Implantable Pulse Generator Implant Date: 20130319
Lead Channel Impedance Value: 376 Ohm
Lead Channel Impedance Value: 456 Ohm
Lead Channel Sensing Intrinsic Amplitude: 1.907 mV
Lead Channel Sensing Intrinsic Amplitude: 15.35 mV
Lead Channel Setting Pacing Amplitude: 2 V
Lead Channel Setting Sensing Sensitivity: 0.9 mV

## 2021-05-16 ENCOUNTER — Telehealth: Payer: Self-pay

## 2021-05-16 ENCOUNTER — Ambulatory Visit: Payer: Medicare Other | Admitting: Neurology

## 2021-05-16 ENCOUNTER — Encounter: Payer: Self-pay | Admitting: Neurology

## 2021-05-16 ENCOUNTER — Other Ambulatory Visit: Payer: Self-pay

## 2021-05-16 VITALS — BP 136/80 | HR 79 | Ht 63.0 in | Wt 137.0 lb

## 2021-05-16 DIAGNOSIS — R269 Unspecified abnormalities of gait and mobility: Secondary | ICD-10-CM

## 2021-05-16 DIAGNOSIS — G629 Polyneuropathy, unspecified: Secondary | ICD-10-CM

## 2021-05-16 NOTE — Progress Notes (Signed)
[facility] Clinic Note - Initial Visit   Date: 05/16/21  [name] MRN: [id number] DOB: 07/25/1934   Dear Dr. Ronnald Ramp:  Thank you for your kind referral of [name] for consultation of neuropathy. Although her history is well known to you, please allow [place] to reiterate it for the purpose of our medical record. The patient was accompanied to the clinic by self.    History of Present Illness: [name] is a 86 y.o. right-handed female with sinus node dysfunction and sinus arrest s/p PPM implant, history of Non Hodgkin's lymphoma (initially diagnosed 04/2011, recurrence in 2014), hypertension, hypothyroidism, PAD, and GERD presenting for evaluation of neuropathy.   For the past 8-10 years, she has tingling, numbness, hot-cold sensation which started in the feet and gradually moved up to the level of her knees. She takes gabapentin 300mg  four times daily which helps alleviate her pain.  Over the past 6 months, she has noticed more numbness involving the toes, which is what she is more concerned about. Her balance is fair. She walks unassisted and has not suffered any falls. No history of diabetes or alcohol.  She has mild PAD, managed conservatively.   She lives alone in a 3 level home. 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  Out-side paper records, electronic medical record, and images have been reviewed where available and summarized as:   Lab Results  Component Value Date   VITAMINB12 >1526 (H) 05/08/2020   Lab Results  Component Value Date   TSH 0.74 05/08/2020   Lab Results  Component Value Date   ESRSEDRATE 19 03/07/2013    Past Medical History:  Diagnosis Date   Anemia    pt denies   Arthritis    Atrial fibrillation ([place])    Cataract    bil   Common variable immunodeficiency ([place])    Cystocele    history with rectocele, pt denies   GERD (gastroesophageal reflux disease)    H/O hiatal hernia     Hypertension    Hypothyroidism    Non Hodgkin's lymphoma ([place])    dx 2012 and now in remission   Osteoporosis    Pacemaker 09/15/2011   Medtronic Revo   Pericarditis    Pleural effusion, bilateral    history of   Pulmonary nodules    history of   Scoliosis    Varicose vein     Past Surgical History:  Procedure Laterality Date   BREAST SURGERY  07/30/01   biopsy x 3, bilateral   CATARACT EXTRACTION Bilateral    COLONOSCOPY     ESOPHAGOGASTRODUODENOSCOPY (EGD) WITH PROPOFOL N/A 12/10/2020   Procedure: ESOPHAGOGASTRODUODENOSCOPY (EGD) WITH PROPOFOL;  Surgeon: Ronnette Juniper, MD;  Location: [facility];  Service: Gastroenterology;  Laterality: N/A;  NG Tube Insertion   MASS EXCISION Right 10/28/2012   Procedure: Removal of mass on neck       ;  Surgeon: Adin Hector, MD;  Location: [facility];  Service: General;  Laterality: Right;   PARTIAL HYSTERECTOMY  04/29/2004   PERMANENT PACEMAKER INSERTION N/A 09/15/2011   Procedure: PERMANENT PACEMAKER INSERTION;  Surgeon: Sanda Klein, MD;  Location: [facility] CATH [facility];  Service: Cardiovascular;  Laterality: N/A;   RADIOACTIVE SEED GUIDED EXCISIONAL BREAST BIOPSY Left 08/11/2017   Procedure: LEFT RADIOACTIVE SEED GUIDED EXCISIONAL BREAST BIOPSY ERAS PATHWAY;  Surgeon: Stark Klein, MD;  Location: [facility];  Service: General;  Laterality: Left;   TONSILLECTOMY  Medications:  Outpatient Encounter Medications as of 05/16/2021  Medication Sig   acetaminophen (TYLENOL) 500 MG tablet Take 500 mg by mouth every 6 (six) hours as needed (for pain.).    Calcium Carbonate-Vitamin D 600-400 MG-UNIT tablet Take 1 tablet by mouth 2 (two) times daily.   denosumab (PROLIA) 60 MG/ML SOLN injection Inject 60 mg into the skin every 6 (six) months. Reported on 10/16/2015   diphenoxylate-atropine (LOMOTIL) 2.5-0.025 MG tablet Take 1 tablet by mouth 2 (two) times daily as needed for diarrhea or loose stools.   EPINEPHrine 0.3 mg/0.3 mL IJ SOAJ injection Inject 0.3 mg  into the muscle as needed for anaphylaxis.   ferrous fumarate (HEMOCYTE - 106 MG FE) 325 (106 Fe) MG TABS tablet Take 1 tablet by mouth daily.   gabapentin (NEURONTIN) 300 MG capsule Take 1 capsule (300 mg total) by mouth 4 (four) times daily.   Immune Globulin, Human, (HIZENTRA) 4 GM/20ML SOLN Inject 8 g into the skin once a week.   levothyroxine (SYNTHROID) 100 MCG tablet Take 1 tablet (100 mcg total) by mouth daily.   lisinopril (ZESTRIL) 20 MG tablet TAKE 1 TABLET(20 MG) BY MOUTH IN THE MORNING AND AT BEDTIME   Magnesium Oxide (MAG-OX 400 PO) Take 1 tablet by mouth daily.   metoprolol tartrate (LOPRESSOR) 25 MG tablet Take 0.5 tablets (12.5 mg total) by mouth 2 (two) times daily.   olopatadine (PATANOL) 0.1 % ophthalmic solution Place 1 drop into both eyes 2 times daily.   omeprazole (PRILOSEC) 40 MG capsule TAKE 1 CAPSULE(40 MG) BY MOUTH DAILY   ondansetron (ZOFRAN-ODT) 4 MG disintegrating tablet Take 1 tablet (4 mg total) by mouth every 6 (six) hours as needed for nausea.   potassium chloride (KLOR-CON) 10 MEQ tablet TAKE 1 TABLET(10 MEQ) BY MOUTH DAILY   No facility-administered encounter medications on file as of 05/16/2021.    Allergies:  Allergies  Allergen Reactions   Amoxicillin Hives    Has patient had a PCN reaction causing immediate rash, facial/tongue/throat swelling, SOB or lightheadedness with hypotension: No Has patient had a PCN reaction causing severe rash involving mucus membranes or skin necrosis: No Has patient had a PCN reaction that required hospitalization: No Has patient had a PCN reaction occurring within the last 10 years: No If all of the above answers are "NO", then may proceed with Cephalosporin use.    Buprenorphine Hives   Codeine Hives and Other (See Comments)    "makes her crazy"    Demerol Hives   Dextromethorphan-Guaifenesin     insomnia   Morphine And Related Hives   Sulfonamide Derivatives Rash   Vicodin [Hydrocodone-Acetaminophen] Hives     Family History: Family History  Problem Relation Age of Onset   Stroke Mother    Stroke Father    Heart disease Daughter        congenital "whole in her heart"   Lymphoma Daughter        nonhodgkins   Breast cancer Sister    Heart disease Brother    Colon cancer Neg Hx    Esophageal cancer Neg Hx    Pancreatic cancer Neg Hx    Stomach cancer Neg Hx    Liver disease Neg Hx     Social History: Social History   Tobacco Use   Smoking status: Never   Smokeless tobacco: Never  Vaping Use   Vaping Use: Never used  Substance Use Topics   Alcohol use: Yes    Comment: 1 glass of  wine occasionally   Drug use: No   Social History   Social History Narrative   She currently works as an Producer, 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  She lives alone in a 3-story home.  There is a lift chair which she does not use (placed for her husband).      Right handed     Vital Signs:  BP 136/80   Pulse 79   Ht 5\' 3"  (1.6 m)   Wt 137 lb (62.1 kg)   SpO2 95%   BMI 24.27 kg/m    Neurological Exam: MENTAL STATUS including orientation to time, place, person, recent and remote memory, attention span and concentration, language, and fund of knowledge is normal.  Speech is not dysarthric.  CRANIAL NERVES: II:  No visual field defects.   III-IV-VI: Pupils equal round and reactive to light.  Normal conjugate, extra-ocular eye movements in all directions of gaze.  No nystagmus.  No ptosis.   V:  Normal facial sensation.    VII:  Normal facial symmetry and movements.   VIII:  Normal hearing and vestibular function.   IX-X:  Normal palatal movement.   XI:  Normal shoulder shrug and head rotation.   XII:  Normal tongue strength and range of motion, no deviation or fasciculation.  MOTOR:  No atrophy, fasciculations or abnormal movements.  No pronator drift.   Upper Extremity:  Right  Left  Deltoid  5/5   5/5   Biceps  5/5   5/5   Triceps  5/5   5/5    Infraspinatus 5/5  5/5  Medial pectoralis 5/5  5/5  Wrist extensors  5/5   5/5   Wrist flexors  5/5   5/5   Finger extensors  5/5   5/5   Finger flexors  5/5   5/5   Dorsal interossei  5/5   5/5   Abductor pollicis  5/5   5/5   Tone (Ashworth scale)  0  0   Lower Extremity:  Right  Left  Hip flexors  5/5   5/5   Hip extensors  5/5   5/5   Adductor 5/5  5/5  Abductor 5/5  5/5  Knee flexors  5/5   5/5   Knee extensors  5/5   5/5   Dorsiflexors  5/5   5/5   Plantarflexors  5/5   5/5   Toe extensors  5-/5   5-/5   Toe flexors  5-/5   5-/5   Tone (Ashworth scale)  0  0   MSRs:  Right        Left                  brachioradialis 2+  2+  biceps 2+  2+  triceps 2+  2+  patellar 1+  1+  ankle jerk 0  0  Hoffman no  no  plantar response down  down   SENSORY:  Vibration is absent below the ankles, there is a gradient pattern of temperature and pinprick loss below mid-calf.  COORDINATION/GAIT: Normal finger-to- nose-finger.  Intact rapid alternating movements bilaterally.  Gait narrow based and stable. Stressed gait intact, mild unsteadiness with tandem gait.   IMPRESSION: Peripheral neuropathy, most likely idiopathic. Risk factors include PAD, Non-Hodgkins lymphoma. I had extensive discussion with the patient regarding the pathogenesis, etiology, management, and natural course of neuropathy. Neuropathy tends to be slowly progressive, especially if a treatable etiology is not identified.  Prior labs have  not disclosed abnormalities. I discussed that in the vast majority of cases, despite checking for reversible causes, we are unable to find the underlying etiology and management is symptomatic.  NCS/EMG discussed and given the classic presentation and exam, opted to defer testing at this time since it would not change management. Unfortunately, numbness does not respond to medication. Fortunately, she is getting adequate relief of pain with gabapentin 300mg  four times daily.  She  expresses interest in reducing the dose and I suggest she try lowering it to 300mg  TID and see how she feels. For imbalance, I will refer her to out-patient PT.   Return to clinic as needed  Thank you for allowing me to participate in patient's care.  If I can answer any additional questions, I would be pleased to do so.    Sincerely,    Elmarie Devlin K. Posey Pronto, DO

## 2021-05-16 NOTE — Patient Instructions (Signed)
We will refer you for out-patient physical therapy

## 2021-05-16 NOTE — Telephone Encounter (Signed)
Spoke with patient informed her that her pacemaker was nearing ERI and would be scheduling monthly battery checks informed patient that she is to seen a transmission on 06/12/21, patient voiced understanding.

## 2021-05-19 ENCOUNTER — Telehealth: Payer: Self-pay | Admitting: Internal Medicine

## 2021-05-19 ENCOUNTER — Other Ambulatory Visit: Payer: Self-pay | Admitting: Internal Medicine

## 2021-05-19 ENCOUNTER — Other Ambulatory Visit: Payer: Self-pay

## 2021-05-19 ENCOUNTER — Telehealth: Payer: Self-pay | Admitting: Cardiovascular Disease

## 2021-05-19 DIAGNOSIS — E039 Hypothyroidism, unspecified: Secondary | ICD-10-CM

## 2021-05-19 DIAGNOSIS — D839 Common variable immunodeficiency, unspecified: Secondary | ICD-10-CM | POA: Diagnosis not present

## 2021-05-19 MED ORDER — METOPROLOL TARTRATE 25 MG PO TABS: 12.5000 mg | ORAL_TABLET | Freq: Two times a day (BID) | ORAL | 0 refills | Status: DC

## 2021-05-19 MED ORDER — LEVOTHYROXINE SODIUM 100 MCG PO TABS
100.0000 ug | ORAL_TABLET | Freq: Every day | ORAL | 1 refills | Status: DC
Start: 2021-05-19 — End: 2021-09-04

## 2021-05-19 NOTE — Telephone Encounter (Signed)
Patient made aware that from a pacemaker standpoint she can travel. She will leave Wednesday and come back on Sunday. Appointment made 12/12 with Dr. Sallyanne Kuster.   Croitoru, Dani Gobble, MD  You; Cristopher Estimable, RN 29 minutes ago (2:35 PM)   The pacemaker will offer full service until the battery is ready for change out, which is likely not to occur for another 2 to 3 months.  Even after that, the device will offer 3 months of full service.    The device has not recorded any recent rhythm problems over the last couple of months.  Dizziness occurring randomly at rest, with or without changes in position is less likely to be cardiovascular in nature.  I think it is fine to travel from a pacemaker point of view.    Thanks

## 2021-05-19 NOTE — Telephone Encounter (Signed)
Pt is wanting to go to [place] for Thanksgiving and is wanting to know if this is ok... please advise

## 2021-05-19 NOTE — Telephone Encounter (Signed)
1.Medication Requested: levothyroxine (SYNTHROID) 100 MCG tablet  2. Pharmacy (Name, Street, [facility]): [facility] [place], [place] [address] [facility]  Phone:  [phone] Fax:  [phone]   3. On Med List: yes  4. Last Visit with PCP: 04.06.22  5. Next visit date with PCP: 10.06.22   Agent: Please be advised that RX refills may take up to 3 business days. We ask that you follow-up with your pharmacy.

## 2021-05-19 NOTE — Telephone Encounter (Signed)
*  STAT* If patient is at the pharmacy, call can be transferred to refill team.   1. Which medications need to be refilled? (please list name of each medication and dose if known) metoprolol tartrate (LOPRESSOR) 25 MG tablet  2. Which pharmacy/location (including street and city if local pharmacy) is medication to be sent to? [place], [place] AT [address] [place]  3. Do they need a 30 day or 90 day supply? 90 ds

## 2021-05-19 NOTE — Telephone Encounter (Signed)
Spoke with pt, she received a call on Friday that her battery was getting weaker and they were going to be checking it more frequently. Since the call on Friday she felt like she needed to make sure it was okay for her to travel. She reports dizziness that can happen anytime, lying, sitting or standing. She also reports weak spells that has been going on for a couple months now. Explained to the patient we have not seen her since 2020 and that we may not be able to tell her if she can travel or not. Aware will forward to dr croitoru to review.

## 2021-05-21 NOTE — Progress Notes (Signed)
Remote pacemaker transmission.   

## 2021-05-26 DIAGNOSIS — D839 Common variable immunodeficiency, unspecified: Secondary | ICD-10-CM | POA: Diagnosis not present

## 2021-05-28 ENCOUNTER — Other Ambulatory Visit: Payer: Self-pay | Admitting: Internal Medicine

## 2021-05-28 DIAGNOSIS — I10 Essential (primary) hypertension: Secondary | ICD-10-CM

## 2021-05-28 DIAGNOSIS — M419 Scoliosis, unspecified: Secondary | ICD-10-CM | POA: Diagnosis not present

## 2021-05-28 DIAGNOSIS — M4125 Other idiopathic scoliosis, thoracolumbar region: Secondary | ICD-10-CM | POA: Diagnosis not present

## 2021-05-28 DIAGNOSIS — G894 Chronic pain syndrome: Secondary | ICD-10-CM | POA: Diagnosis not present

## 2021-05-28 DIAGNOSIS — M545 Low back pain, unspecified: Secondary | ICD-10-CM | POA: Diagnosis not present

## 2021-05-28 DIAGNOSIS — M47816 Spondylosis without myelopathy or radiculopathy, lumbar region: Secondary | ICD-10-CM | POA: Diagnosis not present

## 2021-05-28 DIAGNOSIS — M791 Myalgia, unspecified site: Secondary | ICD-10-CM | POA: Diagnosis not present

## 2021-05-31 ENCOUNTER — Other Ambulatory Visit: Payer: Self-pay | Admitting: Internal Medicine

## 2021-05-31 DIAGNOSIS — I10 Essential (primary) hypertension: Secondary | ICD-10-CM

## 2021-06-02 DIAGNOSIS — D839 Common variable immunodeficiency, unspecified: Secondary | ICD-10-CM | POA: Diagnosis not present

## 2021-06-03 ENCOUNTER — Telehealth: Payer: Self-pay

## 2021-06-03 ENCOUNTER — Ambulatory Visit (INDEPENDENT_AMBULATORY_CARE_PROVIDER_SITE_OTHER): Payer: Medicare Other

## 2021-06-03 ENCOUNTER — Encounter: Payer: Self-pay | Admitting: Internal Medicine

## 2021-06-03 ENCOUNTER — Ambulatory Visit (INDEPENDENT_AMBULATORY_CARE_PROVIDER_SITE_OTHER): Payer: Medicare Other | Admitting: Internal Medicine

## 2021-06-03 ENCOUNTER — Other Ambulatory Visit: Payer: Self-pay

## 2021-06-03 VITALS — BP 126/86 | HR 74 | Temp 98.1°F | Ht 63.0 in | Wt 137.0 lb

## 2021-06-03 DIAGNOSIS — D539 Nutritional anemia, unspecified: Secondary | ICD-10-CM | POA: Diagnosis not present

## 2021-06-03 DIAGNOSIS — R052 Subacute cough: Secondary | ICD-10-CM | POA: Diagnosis not present

## 2021-06-03 DIAGNOSIS — J208 Acute bronchitis due to other specified organisms: Secondary | ICD-10-CM

## 2021-06-03 DIAGNOSIS — R059 Cough, unspecified: Secondary | ICD-10-CM | POA: Diagnosis not present

## 2021-06-03 DIAGNOSIS — I10 Essential (primary) hypertension: Secondary | ICD-10-CM | POA: Diagnosis not present

## 2021-06-03 DIAGNOSIS — E785 Hyperlipidemia, unspecified: Secondary | ICD-10-CM | POA: Diagnosis not present

## 2021-06-03 DIAGNOSIS — J189 Pneumonia, unspecified organism: Secondary | ICD-10-CM

## 2021-06-03 DIAGNOSIS — U071 COVID-19: Secondary | ICD-10-CM

## 2021-06-03 DIAGNOSIS — R0602 Shortness of breath: Secondary | ICD-10-CM | POA: Diagnosis not present

## 2021-06-03 DIAGNOSIS — E039 Hypothyroidism, unspecified: Secondary | ICD-10-CM

## 2021-06-03 DIAGNOSIS — Z0001 Encounter for general adult medical examination with abnormal findings: Secondary | ICD-10-CM

## 2021-06-03 DIAGNOSIS — E2839 Other primary ovarian failure: Secondary | ICD-10-CM

## 2021-06-03 DIAGNOSIS — Z23 Encounter for immunization: Secondary | ICD-10-CM

## 2021-06-03 DIAGNOSIS — I517 Cardiomegaly: Secondary | ICD-10-CM | POA: Diagnosis not present

## 2021-06-03 LAB — HEPATIC FUNCTION PANEL
ALT: 22 U/L (ref 0–35)
AST: 31 U/L (ref 0–37)
Albumin: 4.8 g/dL (ref 3.5–5.2)
Alkaline Phosphatase: 74 U/L (ref 39–117)
Bilirubin, Direct: 0.1 mg/dL (ref 0.0–0.3)
Total Bilirubin: 0.4 mg/dL (ref 0.2–1.2)
Total Protein: 8.2 g/dL (ref 6.0–8.3)

## 2021-06-03 LAB — BASIC METABOLIC PANEL
BUN: 20 mg/dL (ref 6–23)
CO2: 27 mEq/L (ref 19–32)
Calcium: 10.2 mg/dL (ref 8.4–10.5)
Chloride: 101 mEq/L (ref 96–112)
Creatinine, Ser: 0.86 mg/dL (ref 0.40–1.20)
GFR: 61.83 mL/min (ref >60.00)
Glucose, Bld: 85 mg/dL (ref 70–99)
Potassium: 4.7 mEq/L (ref 3.5–5.1)
Sodium: 137 mEq/L (ref 135–145)

## 2021-06-03 LAB — MAGNESIUM: Magnesium: 2.2 mg/dL (ref 1.5–2.5)

## 2021-06-03 LAB — SARS-COV-2 IGG: SARS-COV-2 IgG: 5.96

## 2021-06-03 LAB — CBC WITH DIFFERENTIAL/PLATELET
Basophils Absolute: 0 10*3/uL (ref 0.0–0.1)
Basophils Relative: 0.3 % (ref 0.0–3.0)
Eosinophils Absolute: 0.1 10*3/uL (ref 0.0–0.7)
Eosinophils Relative: 0.8 % (ref 0.0–5.0)
HCT: 44 % (ref 36.0–46.0)
Hemoglobin: 14.4 g/dL (ref 12.0–15.0)
Lymphocytes Relative: 27.4 % (ref 12.0–46.0)
Lymphs Abs: 2.7 10*3/uL (ref 0.7–4.0)
MCHC: 32.7 g/dL (ref 30.0–36.0)
MCV: 96.2 fl (ref 78.0–100.0)
Monocytes Absolute: 0.9 10*3/uL (ref 0.1–1.0)
Monocytes Relative: 8.7 % (ref 3.0–12.0)
Neutro Abs: 6.3 10*3/uL (ref 1.4–7.7)
Neutrophils Relative %: 62.8 % (ref 43.0–77.0)
Platelets: 280 10*3/uL (ref 150.0–400.0)
RBC: 4.57 Mil/uL (ref 3.87–5.11)
RDW: 14.2 % (ref 11.5–15.5)
WBC: 10 10*3/uL (ref 4.0–10.5)

## 2021-06-03 LAB — IBC + FERRITIN
Ferritin: 122.1 ng/mL (ref 10.0–291.0)
Iron: 105 ug/dL (ref 42–145)
Saturation Ratios: 29.8 % (ref 20.0–50.0)
TIBC: 352.8 ug/dL (ref 250.0–450.0)
Transferrin: 252 mg/dL (ref 212.0–360.0)

## 2021-06-03 LAB — LIPID PANEL
Cholesterol: 182 mg/dL (ref 0–200)
HDL: 47.4 mg/dL (ref >39.00)
LDL Cholesterol: 96 mg/dL (ref 0–99)
NonHDL: 134.58
Total CHOL/HDL Ratio: 4
Triglycerides: 191 mg/dL — ABNORMAL HIGH (ref 0.0–149.0)
VLDL: 38.2 mg/dL (ref 0.0–40.0)

## 2021-06-03 LAB — TSH: TSH: 3.37 u[IU]/mL (ref 0.35–5.50)

## 2021-06-03 LAB — FOLATE: Folate: >23.4 ng/mL (ref >5.9)

## 2021-06-03 LAB — VITAMIN B12: Vitamin B-12: >1550 pg/mL — ABNORMAL HIGH (ref 211–911)

## 2021-06-03 MED ORDER — BOOSTRIX 5-2.5-18.5 LF-MCG/0.5 IM SUSP: 0.5000 mL | Freq: Once | INTRAMUSCULAR | 0 refills | Status: AC

## 2021-06-03 MED ORDER — CEFDINIR 300 MG PO CAPS: 300.0000 mg | ORAL_CAPSULE | Freq: Two times a day (BID) | ORAL | 0 refills | Status: DC

## 2021-06-03 NOTE — Patient Instructions (Signed)

## 2021-06-03 NOTE — Telephone Encounter (Signed)
Error note

## 2021-06-03 NOTE — Progress Notes (Signed)
Subjective:  Patient ID: [name], female    DOB: 08/05/1934  Age: 86 y.o. MRN: [id number]  CC: Annual Exam, Cough, and Anemia  This visit occurred during the SARS-CoV-2 public health emergency.  Safety protocols were in place, including screening questions prior to the visit, additional usage of staff PPE, and extensive cleaning of exam room while observing appropriate contact time as indicated for disinfecting solutions.    HPI [name] presents for a CPX and f/up -  She complains of a 6-day history of cough productive of yellow phlegm with low-grade fever, chills, shortness of breath, and wheezing.  Outpatient Medications Prior to Visit  Medication Sig Dispense Refill   acetaminophen (TYLENOL) 500 MG tablet Take 500 mg by mouth every 6 (six) hours as needed (for pain.).      Calcium Carbonate-Vitamin D 600-400 MG-UNIT tablet Take 1 tablet by mouth 2 (two) times daily.     denosumab (PROLIA) 60 MG/ML SOLN injection Inject 60 mg into the skin every 6 (six) months. Reported on 10/16/2015     EPINEPHrine 0.3 mg/0.3 mL IJ SOAJ injection Inject 0.3 mg into the muscle as needed for anaphylaxis.     ferrous fumarate (HEMOCYTE - 106 MG FE) 325 (106 Fe) MG TABS tablet Take 1 tablet by mouth daily.     Immune Globulin, Human, (HIZENTRA) 4 GM/20ML SOLN Inject 8 g into the skin once a week.     levothyroxine (SYNTHROID) 100 MCG tablet Take 1 tablet (100 mcg total) by mouth daily. 90 tablet 1   lisinopril (ZESTRIL) 20 MG tablet TAKE 1 TABLET(20 MG) BY MOUTH IN THE MORNING AND AT BEDTIME 180 tablet 0   Magnesium Oxide (MAG-OX 400 PO) Take 1 tablet by mouth daily.     metoprolol tartrate (LOPRESSOR) 25 MG tablet Take 0.5 tablets (12.5 mg total) by mouth 2 (two) times daily. 15 tablet 0   olopatadine (PATANOL) 0.1 % ophthalmic solution Place 1 drop into both eyes 2 times daily. 5 mL 1   omeprazole (PRILOSEC) 40 MG capsule TAKE 1 CAPSULE(40 MG) BY MOUTH DAILY 90 capsule 0   potassium  chloride (KLOR-CON) 10 MEQ tablet TAKE 1 TABLET(10 MEQ) BY MOUTH DAILY 90 tablet 0   diphenoxylate-atropine (LOMOTIL) 2.5-0.025 MG tablet Take 1 tablet by mouth 2 (two) times daily as needed for diarrhea or loose stools. 60 tablet 2   ondansetron (ZOFRAN-ODT) 4 MG disintegrating tablet Take 1 tablet (4 mg total) by mouth every 6 (six) hours as needed for nausea. 15 tablet 0   gabapentin (NEURONTIN) 300 MG capsule Take 1 capsule (300 mg total) by mouth 4 (four) times daily. 360 capsule 1   No facility-administered medications prior to visit.    ROS Review of Systems  Constitutional:  Positive for chills and fever. Negative for diaphoresis and fatigue.  HENT: Negative.  Negative for sore throat.   Respiratory:  Positive for cough and wheezing. Negative for choking, chest tightness, shortness of breath and stridor.   Cardiovascular:  Negative for chest pain, palpitations and leg swelling.  Gastrointestinal:  Negative for abdominal pain, blood in stool, diarrhea, nausea and vomiting.  Genitourinary: Negative.  Negative for difficulty urinating.  Musculoskeletal: Negative.   Skin: Negative.   Neurological:  Negative for dizziness, weakness, light-headedness and headaches.  Hematological:  Negative for adenopathy. Does not bruise/bleed easily.  Psychiatric/Behavioral: Negative.     Objective:  BP 126/86 (BP Location: Left Arm, Patient Position: Sitting, Cuff Size: Normal)   Pulse 74  Temp 98.1 F (36.7 C) (Oral)   Ht 5\' 3"  (1.6 m)   Wt 137 lb (62.1 kg)   SpO2 98%   BMI 24.27 kg/m   BP Readings from Last 3 Encounters:  06/03/21 126/86  05/16/21 136/80  03/04/21 107/67    Wt Readings from Last 3 Encounters:  06/03/21 137 lb (62.1 kg)  05/16/21 137 lb (62.1 kg)  03/04/21 134 lb (60.8 kg)    Physical Exam Vitals reviewed.  Constitutional:      Appearance: Normal appearance. She is not ill-appearing.  HENT:     Nose: Nose normal.     Mouth/Throat:     Mouth: Mucous membranes  are moist.  Eyes:     General: No scleral icterus.    Conjunctiva/sclera: Conjunctivae normal.  Cardiovascular:     Rate and Rhythm: Normal rate and regular rhythm.  Pulmonary:     Effort: Pulmonary effort is normal. No tachypnea, accessory muscle usage or respiratory distress.     Breath sounds: No stridor or decreased air movement. Examination of the left-lower field reveals decreased breath sounds, wheezing and rales. Decreased breath sounds, wheezing and rales present. No rhonchi.  Abdominal:     General: Abdomen is flat.     Palpations: There is no mass.     Tenderness: There is no abdominal tenderness. There is no guarding.     Hernia: No hernia is present.  Musculoskeletal:        General: Normal range of motion.     Cervical back: Neck supple.     Right lower leg: No edema.     Left lower leg: No edema.  Lymphadenopathy:     Cervical: No cervical adenopathy.  Skin:    General: Skin is warm and dry.     Coloration: Skin is not pale.  Neurological:     General: No focal deficit present.     Mental Status: She is alert.  Psychiatric:        Mood and Affect: Mood normal.        Behavior: Behavior normal.    Lab Results  Component Value Date   WBC 10.0 06/03/2021   HGB 14.4 06/03/2021   HCT 44.0 06/03/2021   PLT 280.0 06/03/2021   GLUCOSE 85 06/03/2021   CHOL 182 06/03/2021   TRIG 191.0 (H) 06/03/2021   HDL 47.40 06/03/2021   LDLCALC 96 06/03/2021   ALT 22 06/03/2021   AST 31 06/03/2021   NA 137 06/03/2021   K 4.7 06/03/2021   CL 101 06/03/2021   CREATININE 0.86 06/03/2021   BUN 20 06/03/2021   CO2 27 06/03/2021   TSH 3.37 06/03/2021    VAS [place] ABI WITH/WO TBI  Result Date: 03/04/2021  LOWER EXTREMITY DOPPLER STUDY Patient Name:  [name]  Date of Exam:   03/04/2021 Medical Rec #: [id number]          Accession #:    [id number] Date of Birth: 1935/05/25          Patient Gender: F Patient Age:   46 years Exam Location:  [facility]  Procedure:      VAS [place] ABI WITH/WO TBI Referring Phys: Jamelle Haring --------------------------------------------------------------------------------  Indications: Claudication, and toe discoloration. High Risk Factors: Hypertension. Other Factors: Bilateral lower extremity intermittent claudication. Her symptoms                are a bit atypical, but she does report reproducible discomfort  in her calves when she walks.  Comparison Study: 06/25/20 Performing Technologist: June Leap RDMS, RVT  Examination Guidelines: A complete evaluation includes at minimum, Doppler waveform signals and systolic blood pressure reading at the level of bilateral brachial, anterior tibial, and posterior tibial arteries, when vessel segments are accessible. Bilateral testing is considered an integral part of a complete examination. Photoelectric Plethysmograph (PPG) waveforms and toe systolic pressure readings are included as required and additional duplex testing as needed. Limited examinations for reoccurring indications may be performed as noted.  ABI Findings: +---------+------------------+-----+--------+--------+ Right    Rt Pressure (mmHg)IndexWaveformComment  +---------+------------------+-----+--------+--------+ Brachial 109                                     +---------+------------------+-----+--------+--------+ PTA      95                0.87 biphasic         +---------+------------------+-----+--------+--------+ DP       97                0.89 biphasic         +---------+------------------+-----+--------+--------+ Great Toe63                0.58 Abnormal         +---------+------------------+-----+--------+--------+ +---------+------------------+-----+--------+-------+ Left     Lt Pressure (mmHg)IndexWaveformComment +---------+------------------+-----+--------+-------+ Brachial 103                                     +---------+------------------+-----+--------+-------+ PTA      102               0.94 biphasic        +---------+------------------+-----+--------+-------+ DP       99                0.91 biphasic        +---------+------------------+-----+--------+-------+ Great Toe66                0.61 Abnormal        +---------+------------------+-----+--------+-------+ +-------+-----------+-----------+------------+------------+ ABI/TBIToday's ABIToday's TBIPrevious ABIPrevious TBI +-------+-----------+-----------+------------+------------+ Right  0.87       0.58       0.98        0.71         +-------+-----------+-----------+------------+------------+ Left   0.94       0.61       0.94        0.61         +-------+-----------+-----------+------------+------------+   Summary: Right: Resting right ankle-brachial index indicates mild right lower extremity arterial disease. The right toe-brachial index is abnormal. Left: Resting left ankle-brachial index indicates mild left lower extremity arterial disease. The left toe-brachial index is abnormal.  *See table(s) above for measurements and observations.  Electronically signed by Jamelle Haring on 03/04/2021 at 2:25:52 PM.    Final    DG Chest 2 View  Result Date: 06/03/2021 CLINICAL DATA:  Productive cough. Shortness of breath with exertion over the last 6 days. EXAM: CHEST - 2 VIEW COMPARISON:  12/16/2020 FINDINGS: There is chronic elevation of the left hemidiaphragm. Chronic cardiomegaly and aortic tortuosity. Right lung is clear. There is chronic volume loss at the left lung base related to the elevated hemidiaphragm. This may simply be atelectasis, but left base pneumonia is not excluded. IMPRESSION: Chronic elevation of the left  hemidiaphragm with chronic volume loss at the left lung base. Impossible to exclude mild pneumonia at the left lung base radiographically. Electronically Signed   By: Nelson Chimes M.D.   On: 06/03/2021 11:10      Assessment & Plan:   Alverna was seen today for annual exam, cough and anemia.  Diagnoses and all orders for this visit:  Essential hypertension- Her blood pressure is adequately well controlled. -     Basic metabolic panel; Future -     TSH; Future -     Hepatic function panel; Future -     Magnesium; Future -     Magnesium -     Hepatic function panel -     TSH -     Basic metabolic panel  Hypothyroidism, unspecified type- Her TSH is in the normal range.  She will stay on the current dose of levothyroxine. -     TSH; Future -     TSH  Dyslipidemia, LDL - Statin therapy is not indicated. -     Lipid panel; Future -     TSH; Future -     Hepatic function panel; Future -     Hepatic function panel -     TSH -     Lipid panel  Deficiency anemia- Her H&H have improved.  I will screen her for vitamin deficiencies. -     CBC with Differential/Platelet; Future -     Vitamin B12; Future -     IBC + Ferritin; Future -     Vitamin B1; Future -     Folate; Future -     Folate -     Vitamin B1 -     IBC + Ferritin -     Vitamin B12 -     CBC with Differential/Platelet  Subacute cough- Based on her symptoms, exam, chest x-ray, and slightly positive COVID IgG antibody I think she has had a recent COVID infection.  She has presented too late to benefit from antivirals.  There is also suspicion of left lower lobe pneumonia.  Will treat with a broad-spectrum cephalosporin. -     SARS-COV-2 IgG; Future -     DG Chest 2 View; Future -     SARS-COV-2 IgG  Estrogen deficiency -     DG Bone Density; Future  Need for prophylactic vaccination with combined diphtheria-tetanus-pertussis (DTP) vaccine -     Tdap (BOOSTRIX) 5-2.5-18.5 LF-MCG/0.5 injection; Inject 0.5 mLs into the muscle once for 1 dose.  Pneumonia of left lower lobe due to infectious organism -     cefdinir (OMNICEF) 300 MG capsule; Take 1 capsule (300 mg total) by mouth 2 (two) times daily.  Acute bronchitis due to  COVID-19 virus- She is getting adequate symptom relief with over-the-counter cough suppressants.  I have discontinued Imajean M. Steinert's ondansetron and diphenoxylate-atropine. I am also having her start on Boostrix and cefdinir. Additionally, I am having her maintain her acetaminophen, denosumab, Calcium Carbonate-Vitamin D, Magnesium Oxide (MAG-OX 400 PO), olopatadine, potassium chloride, gabapentin, ferrous fumarate, EPINEPHrine, Hizentra, omeprazole, levothyroxine, metoprolol tartrate, and lisinopril.  Meds ordered this encounter  Medications   Tdap (BOOSTRIX) 5-2.5-18.5 LF-MCG/0.5 injection    Sig: Inject 0.5 mLs into the muscle once for 1 dose.    Dispense:  0.5 mL    Refill:  0   cefdinir (OMNICEF) 300 MG capsule    Sig: Take 1 capsule (300 mg total) by mouth 2 (two) times daily.  Dispense:  20 capsule    Refill:  0      Follow-up: Return in about 3 weeks (around 06/24/2021).  Scarlette Calico, MD

## 2021-06-05 NOTE — Telephone Encounter (Signed)
Hi Dr. Ronnald Ramp,  I just wanted to send a FYI to let you know that pt is past due for Prolia injection as of August. I did not see that Prolia inj was given at [facility] 06/03/21. Is pt going to continue with Prolia therapy?  Thanks,   [employer]

## 2021-06-06 DIAGNOSIS — J208 Acute bronchitis due to other specified organisms: Secondary | ICD-10-CM | POA: Insufficient documentation

## 2021-06-06 DIAGNOSIS — Z0001 Encounter for general adult medical examination with abnormal findings: Secondary | ICD-10-CM | POA: Insufficient documentation

## 2021-06-06 NOTE — Assessment & Plan Note (Signed)
Exam completed Labs reviewed Vaccines reviewed - She refused a flu and pneumonia vaccine today No cancer screenings indicated Patient education was given

## 2021-06-07 ENCOUNTER — Other Ambulatory Visit: Payer: Self-pay | Admitting: Internal Medicine

## 2021-06-07 LAB — VITAMIN B1: Vitamin B1 (Thiamine): <6 nmol/L — ABNORMAL LOW (ref 8–30)

## 2021-06-08 ENCOUNTER — Encounter: Payer: Self-pay | Admitting: Internal Medicine

## 2021-06-08 ENCOUNTER — Other Ambulatory Visit: Payer: Self-pay | Admitting: Internal Medicine

## 2021-06-08 DIAGNOSIS — E519 Thiamine deficiency, unspecified: Secondary | ICD-10-CM | POA: Insufficient documentation

## 2021-06-08 MED ORDER — THIAMINE HCL 100 MG PO TABS: 100.0000 mg | ORAL_TABLET | Freq: Every day | ORAL | 1 refills | Status: DC

## 2021-06-09 ENCOUNTER — Ambulatory Visit (INDEPENDENT_AMBULATORY_CARE_PROVIDER_SITE_OTHER): Payer: Medicare Other | Admitting: Cardiovascular Disease

## 2021-06-09 ENCOUNTER — Encounter: Payer: Self-pay | Admitting: Cardiovascular Disease

## 2021-06-09 ENCOUNTER — Other Ambulatory Visit: Payer: Self-pay

## 2021-06-09 VITALS — BP 112/72 | HR 66 | Ht 63.0 in | Wt 134.6 lb

## 2021-06-09 DIAGNOSIS — I10 Essential (primary) hypertension: Secondary | ICD-10-CM | POA: Diagnosis not present

## 2021-06-09 DIAGNOSIS — I471 Supraventricular tachycardia: Secondary | ICD-10-CM

## 2021-06-09 DIAGNOSIS — D839 Common variable immunodeficiency, unspecified: Secondary | ICD-10-CM | POA: Diagnosis not present

## 2021-06-09 DIAGNOSIS — Z8679 Personal history of other diseases of the circulatory system: Secondary | ICD-10-CM

## 2021-06-09 DIAGNOSIS — R252 Cramp and spasm: Secondary | ICD-10-CM | POA: Diagnosis not present

## 2021-06-09 DIAGNOSIS — Z950 Presence of cardiac pacemaker: Secondary | ICD-10-CM

## 2021-06-09 DIAGNOSIS — I495 Sick sinus syndrome: Secondary | ICD-10-CM

## 2021-06-09 DIAGNOSIS — I5032 Chronic diastolic (congestive) heart failure: Secondary | ICD-10-CM | POA: Diagnosis not present

## 2021-06-09 DIAGNOSIS — E519 Thiamine deficiency, unspecified: Secondary | ICD-10-CM

## 2021-06-09 MED ORDER — LISINOPRIL 10 MG PO TABS: 10.0000 mg | ORAL_TABLET | Freq: Every day | ORAL | 3 refills | Status: DC

## 2021-06-09 NOTE — Progress Notes (Signed)
Cardiology Office Note    Date:  06/10/2021   ID:  Erica, Foster 01-07-1935, MRN [id number]  PCP:  Erica Lima, MD  Cardiologist:   Sanda Klein, MD   Chief Complaint  Patient presents with   Pacemaker Check    History of Present Illness:  [name] is a 86 y.o. female with sinus node dysfunction and implantation of a dual-chamber permanent pacemaker (Medtronic Revo 2013, programmed AAIR to avoid ventricular paced beats), non-Hodgkin's lymphoma in remission.  She has no cardiovascular complaints. The patient specifically denies any chest pain at rest exertion, dyspnea at rest or with exertion, orthopnea, paroxysmal nocturnal dyspnea, syncope, palpitations, focal neurological deficits, intermittent claudication, lower extremity edema, unexplained weight gain, cough, hemoptysis or wheezing.  She does complain of orthostatic dizziness.  This is particularly prominent if she bends over and sits up quickly.  Her blood pressure runs quite low usually.  Her biggest complaint is cramps that keep her awake at night, primarily in her feet, also sometimes in her thighs or calves.  She has tried taking magnesium supplements and drinking lots of fluids, without any benefit.  She has some neuropathic symptoms in her fingers and toes.  She does not drink alcohol.  She had a broad array of labs checked by her primary care provider recently.  All her electrolytes were normal as was a vitamin B12 level.  Surprisingly her vitamin B1 level was very low.  She has a follow-up appointment with Dr. Ronnald Ramp in a few weeks.  Pacemaker function is normal, but her generator is approaching RRT (current battery voltage is 2.83 V, RRT is 2.81 V.  Her device is programmed AAIR at 60 bpm and she has 96% atrial pacing with a good atrial histogram distribution.  Activity is pretty good at 3.8 hours/day, stable over the last year.  She has had 2 episodes of mode switch 1 was 32-second episode of atrial  fibrillation in July and the other one was a recent 13 beat episode of paroxysmal atrial tachycardia with 1: 1 AV conduction.  Presenting ECG today shows atrial paced, ventricular sensed rhythm with an AV delay of 214 ms.  Both the atrial and ventricular leads are Medtronic 5086 leads and they were checked today and found to have excellent sensing/capture/impedance properties.  Shortly after pacemaker implantation she had problems with paroxysmal atrial fibrillation likely related to microperforation and acute pericarditis.  For many years or after she has not had recurrent atrial fibrillation. She did have some shortness of breath in 2018 that responded promptly to diuretics. September 2019, she had a single syncopal events shortly after arriving in [place] after a long airline trip. Echo showed normal left ventricular systolic function but evidence of diastolic dysfunction and increased filling pressure.  She developed problems with hypervolemia while receiving IVIG (Hizentra) infusions.   Past Medical History:  Diagnosis Date   Anemia    pt denies   Arthritis    Atrial fibrillation (HCC)    Cataract    bil   Common variable immunodeficiency (HCC)    Cystocele    history with rectocele, pt denies   GERD (gastroesophageal reflux disease)    H/O hiatal hernia    Hypertension    Hypothyroidism    Non Hodgkin's lymphoma ([place])    dx 2012 and now in remission   Osteoporosis    Pacemaker 09/15/2011   Medtronic Revo   Pericarditis    Pleural effusion, bilateral    history of  Pulmonary nodules    history of   Scoliosis    Varicose vein     Past Surgical History:  Procedure Laterality Date   BREAST SURGERY  07/30/01   biopsy x 3, bilateral   CATARACT EXTRACTION Bilateral    COLONOSCOPY     ESOPHAGOGASTRODUODENOSCOPY (EGD) WITH PROPOFOL N/A 12/10/2020   Procedure: ESOPHAGOGASTRODUODENOSCOPY (EGD) WITH PROPOFOL;  Surgeon: Ronnette Juniper, MD;  Location: [facility];  Service:  Gastroenterology;  Laterality: N/A;  NG Tube Insertion   MASS EXCISION Right 10/28/2012   Procedure: Removal of mass on neck       ;  Surgeon: Adin Hector, MD;  Location: [facility];  Service: General;  Laterality: Right;   PARTIAL HYSTERECTOMY  04/29/2004   PERMANENT PACEMAKER INSERTION N/A 09/15/2011   Procedure: PERMANENT PACEMAKER INSERTION;  Surgeon: Sanda Klein, MD;  Location: [facility];  Service: Cardiovascular;  Laterality: N/A;   RADIOACTIVE SEED GUIDED EXCISIONAL BREAST BIOPSY Left 08/11/2017   Procedure: LEFT RADIOACTIVE SEED GUIDED EXCISIONAL BREAST BIOPSY ERAS PATHWAY;  Surgeon: Stark Klein, MD;  Location: [facility];  Service: General;  Laterality: Left;   TONSILLECTOMY      Current Medications: Outpatient Medications Prior to Visit  Medication Sig Dispense Refill   acetaminophen (TYLENOL) 500 MG tablet Take 500 mg by mouth every 6 (six) hours as needed (for pain.).      Calcium Carbonate-Vitamin D 600-400 MG-UNIT tablet Take 1 tablet by mouth 2 (two) times daily.     cefdinir (OMNICEF) 300 MG capsule Take 1 capsule (300 mg total) by mouth 2 (two) times daily. 20 capsule 0   denosumab (PROLIA) 60 MG/ML SOLN injection Inject 60 mg into the skin every 6 (six) months. Reported on 10/16/2015     ferrous fumarate (HEMOCYTE - 106 MG FE) 325 (106 Fe) MG TABS tablet Take 1 tablet by mouth daily.     gabapentin (NEURONTIN) 300 MG capsule Take 1 capsule (300 mg total) by mouth 4 (four) times daily. 360 capsule 1   Immune Globulin, Human, (HIZENTRA) 4 GM/20ML SOLN Inject 8 g into the skin once a week.     levothyroxine (SYNTHROID) 100 MCG tablet Take 1 tablet (100 mcg total) by mouth daily. 90 tablet 1   Magnesium Oxide (MAG-OX 400 PO) Take 1 tablet by mouth daily.     metoprolol tartrate (LOPRESSOR) 25 MG tablet Take 0.5 tablets (12.5 mg total) by mouth 2 (two) times daily. 15 tablet 0   omeprazole (PRILOSEC) 40 MG capsule TAKE 1 CAPSULE(40 MG) BY MOUTH DAILY 90 capsule 0   potassium  chloride (KLOR-CON) 10 MEQ tablet TAKE 1 TABLET(10 MEQ) BY MOUTH DAILY 90 tablet 0   thiamine 100 MG tablet Take 1 tablet (100 mg total) by mouth daily. 90 tablet 1   lisinopril (ZESTRIL) 20 MG tablet TAKE 1 TABLET(20 MG) BY MOUTH IN THE MORNING AND AT BEDTIME 180 tablet 0   EPINEPHrine 0.3 mg/0.3 mL IJ SOAJ injection Inject 0.3 mg into the muscle as needed for anaphylaxis.     olopatadine (PATANOL) 0.1 % ophthalmic solution Place 1 drop into both eyes 2 times daily. (Patient not taking: Reported on 06/09/2021) 5 mL 1   No facility-administered medications prior to visit.     Allergies:   Amoxicillin, Buprenorphine, Codeine, Demerol, Dextromethorphan-guaifenesin, Morphine and related, Sulfonamide derivatives, and Vicodin [hydrocodone-acetaminophen]   Social History   Socioeconomic History   Marital status: Widowed    Spouse name: Not on file   Number of children: 3  Years of education: Not on file   Highest education level: Not on file  Occupational History   Occupation: [employer]  Tobacco Use   Smoking status: Never   Smokeless tobacco: Never  Vaping Use   Vaping Use: Never used  Substance and Sexual Activity   Alcohol use: Yes    Comment: 1 glass of wine occasionally   Drug use: No   Sexual activity: Not on file  Other Topics Concern   Not on file  Social History Narrative   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  She lives alone in a 3-story home.  There is a lift chair which she does not use (placed for her husband).      Right handed    Social Determinants of Health   Financial Resource Strain: Not on file  Food Insecurity: Not on file  Transportation Needs: Not on file  Physical Activity: Not on file  Stress: Not on file  Social Connections: Not on file     Family History:  The patient's family history includes Breast cancer in her sister; Heart disease in her brother and daughter; Lymphoma in her  daughter; Stroke in her father and mother.   ROS:   Please see the history of present illness.    ROS All other systems reviewed and are negative.   PHYSICAL EXAM:   VS:  BP 112/72 (BP Location: Left Arm, Patient Position: Sitting, Cuff Size: Normal)   Pulse 66   Ht 5\' 3"  (1.6 m)   Wt 134 lb 9.6 oz (61.1 kg)   SpO2 94%   BMI 23.84 kg/m      General: Alert, oriented x3, no distress, lean, healthy left subclavian pacemaker site Head: no evidence of trauma, PERRL, EOMI, no exophtalmos or lid lag, no myxedema, no xanthelasma; normal ears, nose and oropharynx Neck: normal jugular venous pulsations and no hepatojugular reflux; brisk carotid pulses without delay and no carotid bruits Chest: clear to auscultation, no signs of consolidation by percussion or palpation, normal fremitus, symmetrical and full respiratory excursions Cardiovascular: normal position and quality of the apical impulse, regular rhythm, normal first and second heart sounds, no murmurs, rubs or gallops Abdomen: no tenderness or distention, no masses by palpation, no abnormal pulsatility or arterial bruits, normal bowel sounds, no hepatosplenomegaly Extremities: no clubbing, cyanosis or edema; 2+ radial, ulnar and brachial pulses bilaterally; 2+ right femoral, posterior tibial and dorsalis pedis pulses; 2+ left femoral, posterior tibial and dorsalis pedis pulses; no subclavian or femoral bruits Neurological: grossly nonfocal Psych: Normal mood and affect    Wt Readings from Last 3 Encounters:  06/09/21 134 lb 9.6 oz (61.1 kg)  06/03/21 137 lb (62.1 kg)  05/16/21 137 lb (62.1 kg)      Studies/Labs Reviewed:   EKG:  EKG is ordered today and personally reviewed shows atrial paced, ventricular sensed rhythm with a slightly long AV delay of 214 ms, and no repolarization abnormalities, QTC 419 ms  recent Labs: 12/17/2020: B Natriuretic Peptide 230.4 06/03/2021: ALT 22; BUN 20; Creatinine, Ser 0.86; Hemoglobin 14.4;  Magnesium 2.2; Platelets 280.0; Potassium 4.7; Sodium 137; TSH 3.37  Lipid Panel     Component Value Date/Time   CHOL 182 06/03/2021 1054   CHOL 144 11/17/2018 1129   TRIG 191.0 (H) 06/03/2021 1054   HDL 47.40 06/03/2021 1054   HDL 41 11/17/2018 1129   CHOLHDL 4 06/03/2021 1054   VLDL 38.2 06/03/2021 1054   LDLCALC 96 06/03/2021  [place] 11/17/2018 1129   LABVLDL 29 11/17/2018 1129     ASSESSMENT:    1. Chronic diastolic heart failure ([place])   2. Essential hypertension   3. SSS (sick sinus syndrome) ([place])   4. Pacemaker   5. Paroxysmal atrial tachycardia ([place])   6. History of pericarditis   7. Muscle cramps at night   8. Thiamine deficiency      PLAN:  In order of problems listed above:  CHF:  appears to be well compensated.  She is avoiding high sodium foods and the use of nonsteroidal anti-inflammatory drugs. HTN: Blood pressure appears to be very well controlled.  She has symptoms of orthostatic hypotension the metoprolol has added value for atrial arrhythmia control.  We will decrease the lisinopril dose in half to 10 mg once daily.  She has a follow-up appointment with her primary care provider in early January and we should be able to see if this led to any improvement in her symptoms of orthostatic hypotension. SSS: Current sensor settings appear to provide adequate rate response, no evidence of chronotropic incompetence. PM: Device programmed AAIR to avoid ventricular pacing.  Virtually 100% atrial paced.  Anticipate need for pacemaker generator change out probably within the next 3 months.  We will increase the frequency of remote downloads to monthly. PAT/PAF: She has very infrequent and very brief episodes of atrial fibrillation, 90 seconds noted in October 2020 and 32 seconds noted in July 2022.  I do not think this justifies anticoagulation.  Continue to monitor via her pacemaker. History of post procedural acute pericarditis after pacemaker implantation,  resolved. Had atrial fibrillation related to acute pericarditis, which has not recurred in significant amounts over the ensuing years. No evidence of constrictive physiology on echo in 2018. She has not received chest radiation therapy for lymphoma, just chemotherapy.  Consider repeat echo or even cardiac MRI if signs of heart failure worsen, to look for signs of constriction.  Muscle cramps: Reiterated the advice that she stay very well-hydrated.  She has not been able to prevent these by taking mag oxide.  Thiamine deficiency noted.  I am not sure whether this could cause her muscle cramps, although it might explain the numbness in her fingertips.  Also not sure how to explain it.  She does not take diuretics, does not drink alcohol, does not have diarrhea or malabsorption that I am aware of.  Thiamine supplements were prescribed by Dr. Ronnald Ramp at the recent appointment after the labs were drawn, but I am not sure that she is actually been taking these.  Medication Adjustments/Labs and Tests Ordered: Current medicines are reviewed at length with the patient today.  Concerns regarding medicines are outlined above.  Medication changes, Labs and Tests ordered today are listed in the Patient Instructions below. Patient Instructions  Medication Instructions:  DECREASE the Lisinopril to 10 mg once daily START Thiamine 100 mg once daily. This can be bought over the counter  *If you need a refill on your cardiac medications before your next appointment, please call your pharmacy*   Lab Work: None ordered If you have labs (blood work) drawn today and your tests are completely normal, you will receive your results only by: [place] (if you have MyChart) OR A paper copy in the mail If you have any lab test that is abnormal or we need to change your treatment, we will call you to review the results.   Testing/Procedures: None ordered   Follow-Up:  At [facility], you and your health needs are  our priority.  As part of our continuing mission to provide you with exceptional heart care, we have created designated Provider Care Teams.  These Care Teams include your primary Cardiologist (physician) and Advanced Practice Providers (APPs -  Physician Assistants and Nurse Practitioners) who all work together to provide you with the care you need, when you need it.  We recommend signing up for the patient portal called "MyChart".  Sign up information is provided on this After Visit Summary.  MyChart is used to connect with patients for Virtual Visits (Telemedicine).  Patients are able to view lab/test results, encounter notes, upcoming appointments, etc.  Non-urgent messages can be sent to your provider as well.   To learn more about what you can do with MyChart, go to [email or web address].    Your next appointment:   6 month(s)  The format for your next appointment:   In Person  Provider:   Sanda Klein, MD      Signed, Sanda Klein, MD  06/10/2021 4:51 PM    [facility] [address] [place], [place]  [address] Phone: [phone]; Fax: [phone]

## 2021-06-09 NOTE — Patient Instructions (Addendum)
Medication Instructions:  DECREASE the Lisinopril to 10 mg once daily START Thiamine 100 mg once daily. This can be bought over the counter  *If you need a refill on your cardiac medications before your next appointment, please call your pharmacy*   Lab Work: None ordered If you have labs (blood work) drawn today and your tests are completely normal, you will receive your results only by: [place] (if you have MyChart) OR A paper copy in the mail If you have any lab test that is abnormal or we need to change your treatment, we will call you to review the results.   Testing/Procedures: None ordered   Follow-Up: At [facility], you and your health needs are our priority.  As part of our continuing mission to provide you with exceptional heart care, we have created designated Provider Care Teams.  These Care Teams include your primary Cardiologist (physician) and Advanced Practice Providers (APPs -  Physician Assistants and Nurse Practitioners) who all work together to provide you with the care you need, when you need it.  We recommend signing up for the patient portal called "MyChart".  Sign up information is provided on this After Visit Summary.  MyChart is used to connect with patients for Virtual Visits (Telemedicine).  Patients are able to view lab/test results, encounter notes, upcoming appointments, etc.  Non-urgent messages can be sent to your provider as well.   To learn more about what you can do with MyChart, go to [email or web address].    Your next appointment:   6 month(s)  The format for your next appointment:   In Person  Provider:   Sanda Klein, MD

## 2021-06-10 ENCOUNTER — Encounter: Payer: Self-pay | Admitting: Cardiovascular Disease

## 2021-06-10 ENCOUNTER — Telehealth: Payer: Self-pay | Admitting: Cardiovascular Disease

## 2021-06-10 NOTE — Telephone Encounter (Signed)
Called patient, advised of message from MD.  Patient thankful for call back.

## 2021-06-10 NOTE — Telephone Encounter (Signed)
° °  Pt c/o medication issue:  1. Name of Medication: meds clarification  2. How are you currently taking this medication (dosage and times per day)?   3. Are you having a reaction (difficulty breathing--STAT)?   4. What is your medication issue? Pt is requesting to speak with nurse regarding his medications, she said there's something she needs to clarify

## 2021-06-10 NOTE — Telephone Encounter (Signed)
Patient states she has normally been taking Lisinopril 20 mg twice daily and yesterday it was decreased to just 10 mg daily. Patient is not sure if this was correct, because it is a big decrease.  I advised I would send to MD and RN to review to make sure and I could call her back to clarify.   Thank you!

## 2021-06-12 ENCOUNTER — Ambulatory Visit (INDEPENDENT_AMBULATORY_CARE_PROVIDER_SITE_OTHER): Payer: Medicare Other

## 2021-06-12 DIAGNOSIS — I495 Sick sinus syndrome: Secondary | ICD-10-CM

## 2021-06-12 LAB — CUP PACEART REMOTE DEVICE CHECK
Battery Voltage: 2.83 V
Brady Statistic RA Percent Paced: 94.36 %
Date Time Interrogation Session: 20221215111501
Implantable Lead Implant Date: 20130319
Implantable Lead Implant Date: 20130319
Implantable Lead Location: 753859
Implantable Lead Location: 753860
Implantable Pulse Generator Implant Date: 20130319
Lead Channel Impedance Value: 392 Ohm
Lead Channel Impedance Value: 472 Ohm
Lead Channel Sensing Intrinsic Amplitude: 1.597 mV
Lead Channel Sensing Intrinsic Amplitude: 17.056 mV
Lead Channel Setting Pacing Amplitude: 2 V
Lead Channel Setting Sensing Sensitivity: 0.9 mV

## 2021-06-13 ENCOUNTER — Other Ambulatory Visit: Payer: Medicare Other

## 2021-06-16 ENCOUNTER — Ambulatory Visit: Payer: Medicare Other | Admitting: Internal Medicine

## 2021-06-16 DIAGNOSIS — D839 Common variable immunodeficiency, unspecified: Secondary | ICD-10-CM | POA: Diagnosis not present

## 2021-06-24 DIAGNOSIS — D839 Common variable immunodeficiency, unspecified: Secondary | ICD-10-CM | POA: Diagnosis not present

## 2021-06-24 NOTE — Progress Notes (Signed)
Remote pacemaker transmission.   

## 2021-07-01 ENCOUNTER — Ambulatory Visit (INDEPENDENT_AMBULATORY_CARE_PROVIDER_SITE_OTHER): Payer: Medicare Other

## 2021-07-01 ENCOUNTER — Ambulatory Visit (INDEPENDENT_AMBULATORY_CARE_PROVIDER_SITE_OTHER): Payer: Medicare Other | Admitting: Internal Medicine

## 2021-07-01 ENCOUNTER — Other Ambulatory Visit: Payer: Self-pay

## 2021-07-01 ENCOUNTER — Inpatient Hospital Stay: Payer: Medicare Other | Attending: Family

## 2021-07-01 ENCOUNTER — Encounter: Payer: Self-pay | Admitting: Internal Medicine

## 2021-07-01 VITALS — BP 128/80 | HR 72 | Temp 98.2°F | Ht 63.0 in | Wt 132.0 lb

## 2021-07-01 DIAGNOSIS — J189 Pneumonia, unspecified organism: Secondary | ICD-10-CM

## 2021-07-01 DIAGNOSIS — Z23 Encounter for immunization: Secondary | ICD-10-CM | POA: Diagnosis not present

## 2021-07-01 DIAGNOSIS — I10 Essential (primary) hypertension: Secondary | ICD-10-CM | POA: Diagnosis not present

## 2021-07-01 DIAGNOSIS — D839 Common variable immunodeficiency, unspecified: Secondary | ICD-10-CM | POA: Diagnosis not present

## 2021-07-01 NOTE — Progress Notes (Signed)
Subjective:  Patient ID: [name], female    DOB: 1934-07-03  Age: 87 y.o. MRN: [id number]  CC: Follow-up (Pneumonia recheck), Hypothyroidism, and Hypertension  This visit occurred during the SARS-CoV-2 public health emergency.  Safety protocols were in place, including screening questions prior to the visit, additional usage of staff PPE, and extensive cleaning of exam room while observing appropriate contact time as indicated for disinfecting solutions.    HPI [name] presents for f/up -  Her cough has resolved. She denies SOB, DOE, fever, chills, night sweats.  Outpatient Medications Prior to Visit  Medication Sig Dispense Refill   acetaminophen (TYLENOL) 500 MG tablet Take 500 mg by mouth every 6 (six) hours as needed (for pain.).      Calcium Carbonate-Vitamin D 600-400 MG-UNIT tablet Take 1 tablet by mouth 2 (two) times daily.     denosumab (PROLIA) 60 MG/ML SOLN injection Inject 60 mg into the skin every 6 (six) months. Reported on 10/16/2015     EPINEPHrine 0.3 mg/0.3 mL IJ SOAJ injection Inject 0.3 mg into the muscle as needed for anaphylaxis.     ferrous fumarate (HEMOCYTE - 106 MG FE) 325 (106 Fe) MG TABS tablet Take 1 tablet by mouth daily.     Immune Globulin, Human, (HIZENTRA) 4 GM/20ML SOLN Inject 8 g into the skin once a week.     levothyroxine (SYNTHROID) 100 MCG tablet Take 1 tablet (100 mcg total) by mouth daily. 90 tablet 1   lisinopril (ZESTRIL) 10 MG tablet Take 1 tablet (10 mg total) by mouth daily. (Patient taking differently: Take 5 mg by mouth daily.) 90 tablet 3   Magnesium Oxide (MAG-OX 400 PO) Take 1 tablet by mouth daily.     metoprolol tartrate (LOPRESSOR) 25 MG tablet Take 0.5 tablets (12.5 mg total) by mouth 2 (two) times daily. 15 tablet 0   olopatadine (PATANOL) 0.1 % ophthalmic solution Place 1 drop into both eyes 2 times daily. 5 mL 1   omeprazole (PRILOSEC) 40 MG capsule TAKE 1 CAPSULE(40 MG) BY MOUTH DAILY 90 capsule 0   potassium  chloride (KLOR-CON) 10 MEQ tablet TAKE 1 TABLET(10 MEQ) BY MOUTH DAILY 90 tablet 0   thiamine 100 MG tablet Take 1 tablet (100 mg total) by mouth daily. 90 tablet 1   gabapentin (NEURONTIN) 300 MG capsule Take 1 capsule (300 mg total) by mouth 4 (four) times daily. 360 capsule 1   cefdinir (OMNICEF) 300 MG capsule Take 1 capsule (300 mg total) by mouth 2 (two) times daily. 20 capsule 0   No facility-administered medications prior to visit.    ROS Review of Systems  Constitutional:  Negative for diaphoresis and fatigue.  HENT: Negative.    Eyes: Negative.   Respiratory:  Negative for cough, chest tightness, shortness of breath and wheezing.   Cardiovascular:  Negative for chest pain, palpitations and leg swelling.  Gastrointestinal:  Negative for abdominal pain, constipation, diarrhea, nausea and vomiting.  Endocrine: Negative.   Genitourinary: Negative.   Musculoskeletal: Negative.  Negative for back pain and myalgias.  Skin: Negative.   Neurological: Negative.  Negative for dizziness and weakness.  Hematological:  Negative for adenopathy. Does not bruise/bleed easily.   Objective:  BP 128/80 (BP Location: Right Arm, Patient Position: Sitting, Cuff Size: Normal)    Pulse 72    Temp 98.2 F (36.8 C) (Oral)    Ht 5\' 3"  (1.6 m)    Wt 132 lb (59.9 kg)    SpO2 95%  BMI 23.38 kg/m   BP Readings from Last 3 Encounters:  07/01/21 128/80  06/09/21 112/72  06/03/21 126/86    Wt Readings from Last 3 Encounters:  07/01/21 132 lb (59.9 kg)  06/09/21 134 lb 9.6 oz (61.1 kg)  06/03/21 137 lb (62.1 kg)    Physical Exam Vitals reviewed.  Constitutional:      Appearance: She is not ill-appearing.  HENT:     Nose: Nose normal.     Mouth/Throat:     Mouth: Mucous membranes are moist.  Eyes:     General: No scleral icterus.    Conjunctiva/sclera: Conjunctivae normal.  Cardiovascular:     Rate and Rhythm: Normal rate and regular rhythm.     Heart sounds: No murmur heard. Pulmonary:      Effort: Pulmonary effort is normal.     Breath sounds: No stridor. No wheezing, rhonchi or rales.  Abdominal:     General: Abdomen is flat.     Palpations: There is no mass.     Tenderness: There is no abdominal tenderness. There is no guarding.     Hernia: No hernia is present.  Musculoskeletal:     Cervical back: Neck supple.  Lymphadenopathy:     Cervical: No cervical adenopathy.  Skin:    General: Skin is warm and dry.  Neurological:     General: No focal deficit present.     Mental Status: She is alert.  Psychiatric:        Mood and Affect: Mood normal.        Behavior: Behavior normal.    Lab Results  Component Value Date   WBC 10.0 06/03/2021   HGB 14.4 06/03/2021   HCT 44.0 06/03/2021   PLT 280.0 06/03/2021   GLUCOSE 85 06/03/2021   CHOL 182 06/03/2021   TRIG 191.0 (H) 06/03/2021   HDL 47.40 06/03/2021   LDLCALC 96 06/03/2021   ALT 22 06/03/2021   AST 31 06/03/2021   NA 137 06/03/2021   K 4.7 06/03/2021   CL 101 06/03/2021   CREATININE 0.86 06/03/2021   BUN 20 06/03/2021   CO2 27 06/03/2021   TSH 3.37 06/03/2021    VAS [place] ABI WITH/WO TBI  Result Date: 03/04/2021  LOWER EXTREMITY DOPPLER STUDY Patient Name:  [name]  Date of Exam:   03/04/2021 Medical Rec #: [id number]          Accession #:    [id number] Date of Birth: 06-29-1935          Patient Gender: F Patient Age:   67 years Exam Location:  [facility] Procedure:      VAS [place] ABI WITH/WO TBI Referring Phys: Jamelle Haring --------------------------------------------------------------------------------  Indications: Claudication, and toe discoloration. High Risk Factors: Hypertension. Other Factors: Bilateral lower extremity intermittent claudication. Her symptoms                are a bit atypical, but she does report reproducible discomfort                in her calves when she walks.  Comparison Study: 06/25/20 Performing Technologist: June Leap RDMS, RVT  Examination Guidelines: A  complete evaluation includes at minimum, Doppler waveform signals and systolic blood pressure reading at the level of bilateral brachial, anterior tibial, and posterior tibial arteries, when vessel segments are accessible. Bilateral testing is considered an integral part of a complete examination. Photoelectric Plethysmograph (PPG) waveforms and toe systolic pressure readings are included as required and  additional duplex testing as needed. Limited examinations for reoccurring indications may be performed as noted.  ABI Findings: +---------+------------------+-----+--------+--------+  Right     Rt Pressure (mmHg) Index Waveform Comment   +---------+------------------+-----+--------+--------+  Brachial  109                                         +---------+------------------+-----+--------+--------+  PTA       95                 0.87  biphasic           +---------+------------------+-----+--------+--------+  DP        97                 0.89  biphasic           +---------+------------------+-----+--------+--------+  Great Toe 63                 0.58  Abnormal           +---------+------------------+-----+--------+--------+ +---------+------------------+-----+--------+-------+  Left      Lt Pressure (mmHg) Index Waveform Comment  +---------+------------------+-----+--------+-------+  Brachial  103                                        +---------+------------------+-----+--------+-------+  PTA       102                0.94  biphasic          +---------+------------------+-----+--------+-------+  DP        99                 0.91  biphasic          +---------+------------------+-----+--------+-------+  Great Toe 66                 0.61  Abnormal          +---------+------------------+-----+--------+-------+ +-------+-----------+-----------+------------+------------+  ABI/TBI Today's ABI Today's TBI Previous ABI Previous TBI  +-------+-----------+-----------+------------+------------+  Right   0.87        0.58         0.98         0.71          +-------+-----------+-----------+------------+------------+  Left    0.94        0.61        0.94         0.61          +-------+-----------+-----------+------------+------------+   Summary: Right: Resting right ankle-brachial index indicates mild right lower extremity arterial disease. The right toe-brachial index is abnormal. Left: Resting left ankle-brachial index indicates mild left lower extremity arterial disease. The left toe-brachial index is abnormal.  *See table(s) above for measurements and observations.  Electronically signed by Jamelle Haring on 03/04/2021 at 2:25:52 PM.    Final    DG Chest 2 View  Result Date: 07/01/2021 CLINICAL DATA:  F/u to pneumonia. Hx of AFIB, pacemaker, HTN, PE, DM. EXAM: CHEST - 2 VIEW COMPARISON:  06/03/2021 FINDINGS: Stable elevation of left diaphragmatic leaflet, versus moderate hiatal hernia. Lungs are clear. Stable left subclavian dual lead transvenous pacemaker. Heart size and mediastinal contours are within normal limits. Aortic Atherosclerosis (ICD10-170.0). No effusion. Thoracolumbar S shaped scoliosis. IMPRESSION: No acute findings. Elevated left hemidiaphragm versus moderate hiatal hernia,  as before. Electronically Signed   By: Lucrezia Europe M.D.   On: 07/01/2021 11:36     Assessment & Plan:   Adya was seen today for follow-up, hypothyroidism and hypertension.  Diagnoses and all orders for this visit:  Pneumonia of left lower lobe due to infectious organism- Based on her sxs, exam, and CXR this has resolved. -     DG Chest 2 View; Future  Need for influenza vaccination -     Flu Vaccine QUAD High Dose(Fluad)  Need for pneumococcal vaccination -     Pneumococcal conjugate vaccine 20-valent (Prevnar 20)  Essential hypertension- Her BP is adequately well controlled.   I have discontinued The[facility] Bernard's cefdinir. I am also having her maintain her acetaminophen, denosumab, Calcium Carbonate-Vitamin D, Magnesium Oxide  (MAG-OX 400 PO), olopatadine, potassium chloride, gabapentin, ferrous fumarate, EPINEPHrine, Hizentra, omeprazole, levothyroxine, metoprolol tartrate, thiamine, and lisinopril.  No orders of the defined types were placed in this encounter.    Follow-up: No follow-ups on file.  Scarlette Calico, MD

## 2021-07-02 ENCOUNTER — Inpatient Hospital Stay: Payer: Medicare Other | Admitting: Internal Medicine

## 2021-07-08 DIAGNOSIS — D839 Common variable immunodeficiency, unspecified: Secondary | ICD-10-CM | POA: Diagnosis not present

## 2021-07-12 NOTE — Telephone Encounter (Signed)
2023 VOB initiated for Prolia °

## 2021-07-14 ENCOUNTER — Ambulatory Visit (INDEPENDENT_AMBULATORY_CARE_PROVIDER_SITE_OTHER): Payer: Medicare Other

## 2021-07-14 DIAGNOSIS — I495 Sick sinus syndrome: Secondary | ICD-10-CM

## 2021-07-15 LAB — CUP PACEART REMOTE DEVICE CHECK
Battery Voltage: 2.82 V
Brady Statistic RA Percent Paced: 91.71 %
Date Time Interrogation Session: 20230117151133
Implantable Lead Implant Date: 20130319
Implantable Lead Implant Date: 20130319
Implantable Lead Location: 753859
Implantable Lead Location: 753860
Implantable Pulse Generator Implant Date: 20130319
Lead Channel Impedance Value: 392 Ohm
Lead Channel Impedance Value: 456 Ohm
Lead Channel Sensing Intrinsic Amplitude: 1.419 mV
Lead Channel Sensing Intrinsic Amplitude: 15.692 mV
Lead Channel Setting Pacing Amplitude: 2 V
Lead Channel Setting Sensing Sensitivity: 0.9 mV

## 2021-07-15 NOTE — Telephone Encounter (Signed)
Prior auth required for PROLIA  PA PROCESS DETAILS: Please complete the prior authorization form located at [email or web address] or call [phone]  

## 2021-07-21 DIAGNOSIS — D839 Common variable immunodeficiency, unspecified: Secondary | ICD-10-CM | POA: Diagnosis not present

## 2021-07-23 NOTE — Addendum Note (Signed)
Addended by: Douglass Rivers D on: 07/23/2021 10:51 AM   Modules accepted: Level of Service

## 2021-07-23 NOTE — Progress Notes (Signed)
Remote pacemaker transmission.   

## 2021-07-28 DIAGNOSIS — D839 Common variable immunodeficiency, unspecified: Secondary | ICD-10-CM | POA: Diagnosis not present

## 2021-07-31 NOTE — Telephone Encounter (Signed)
Prior auth initiated via [facility] provider portal Pending clinical review PA# [id number]

## 2021-08-04 DIAGNOSIS — D839 Common variable immunodeficiency, unspecified: Secondary | ICD-10-CM | POA: Diagnosis not present

## 2021-08-05 NOTE — Telephone Encounter (Signed)
Pt ready for scheduling on or after 07/31/21  Out-of-pocket cost due at time of visit: $276  Primary: [facility] Medicare Prolia co-insurance: 20% (approximately $276) Admin fee co-insurance: 0%  Secondary: n/a Prolia co-insurance:  Admin fee co-insurance:   Deductible: does not apply  Prior Auth: APPROVED PA# [id number] Valid: 07/31/21-07/31/22  ** This summary of benefits is an estimation of the patient's out-of-pocket cost. Exact cost may vary based on individual plan coverage.

## 2021-08-05 NOTE — Telephone Encounter (Signed)
Prior [employer] APPROVED PA# [id number]

## 2021-08-06 ENCOUNTER — Telehealth: Payer: Self-pay | Admitting: Cardiovascular Disease

## 2021-08-06 ENCOUNTER — Other Ambulatory Visit: Payer: Self-pay

## 2021-08-06 ENCOUNTER — Inpatient Hospital Stay: Payer: Medicare Other | Attending: Internal Medicine | Admitting: Internal Medicine

## 2021-08-06 ENCOUNTER — Inpatient Hospital Stay: Payer: Medicare Other

## 2021-08-06 VITALS — BP 157/108 | HR 87 | Temp 97.7°F | Resp 18 | Wt 137.0 lb

## 2021-08-06 DIAGNOSIS — Z79899 Other long term (current) drug therapy: Secondary | ICD-10-CM | POA: Insufficient documentation

## 2021-08-06 DIAGNOSIS — I10 Essential (primary) hypertension: Secondary | ICD-10-CM | POA: Diagnosis not present

## 2021-08-06 DIAGNOSIS — Z9221 Personal history of antineoplastic chemotherapy: Secondary | ICD-10-CM | POA: Insufficient documentation

## 2021-08-06 DIAGNOSIS — C8221 Follicular lymphoma grade III, unspecified, lymph nodes of head, face, and neck: Secondary | ICD-10-CM

## 2021-08-06 DIAGNOSIS — Z8572 Personal history of non-Hodgkin lymphomas: Secondary | ICD-10-CM | POA: Diagnosis not present

## 2021-08-06 DIAGNOSIS — E039 Hypothyroidism, unspecified: Secondary | ICD-10-CM | POA: Insufficient documentation

## 2021-08-06 LAB — CMP (CANCER CENTER ONLY)
ALT: 26 U/L (ref 0–44)
AST: 38 U/L (ref 15–41)
Albumin: 4.6 g/dL (ref 3.5–5.0)
Alkaline Phosphatase: 84 U/L (ref 38–126)
Anion gap: 7 (ref 5–15)
BUN: 14 mg/dL (ref 8–23)
CO2: 29 mmol/L (ref 22–32)
Calcium: 10 mg/dL (ref 8.9–10.3)
Chloride: 106 mmol/L (ref 98–111)
Creatinine: 0.75 mg/dL (ref 0.44–1.00)
GFR, Estimated: >60 mL/min (ref >60)
Glucose, Bld: 89 mg/dL (ref 70–99)
Potassium: 4.1 mmol/L (ref 3.5–5.1)
Sodium: 142 mmol/L (ref 135–145)
Total Bilirubin: 0.6 mg/dL (ref 0.3–1.2)
Total Protein: 7.8 g/dL (ref 6.5–8.1)

## 2021-08-06 LAB — CBC WITH DIFFERENTIAL (CANCER CENTER ONLY)
Abs Immature Granulocytes: 0.04 10*3/uL (ref 0.00–0.07)
Basophils Absolute: 0.1 10*3/uL (ref 0.0–0.1)
Basophils Relative: 1 %
Eosinophils Absolute: 0.2 10*3/uL (ref 0.0–0.5)
Eosinophils Relative: 2 %
HCT: 41 % (ref 36.0–46.0)
Hemoglobin: 13.9 g/dL (ref 12.0–15.0)
Immature Granulocytes: 1 %
Lymphocytes Relative: 33 %
Lymphs Abs: 2.3 10*3/uL (ref 0.7–4.0)
MCH: 32.3 pg (ref 26.0–34.0)
MCHC: 33.9 g/dL (ref 30.0–36.0)
MCV: 95.3 fL (ref 80.0–100.0)
Monocytes Absolute: 0.4 10*3/uL (ref 0.1–1.0)
Monocytes Relative: 6 %
Neutro Abs: 4 10*3/uL (ref 1.7–7.7)
Neutrophils Relative %: 57 %
Platelet Count: 217 10*3/uL (ref 150–400)
RBC: 4.3 MIL/uL (ref 3.87–5.11)
RDW: 13.5 % (ref 11.5–15.5)
WBC Count: 7 10*3/uL (ref 4.0–10.5)
nRBC: 0 % (ref 0.0–0.2)

## 2021-08-06 LAB — LACTATE DEHYDROGENASE: LDH: 279 U/L — ABNORMAL HIGH (ref 98–192)

## 2021-08-06 NOTE — Telephone Encounter (Signed)
Pt c/o BP issue: STAT if pt c/o blurred vision, one-sided weakness or slurred speech  1. What are your last 5 BP readings?  2/08: 154/114  149/111  2/07: 178/106  2/06: 164/109  2/05: 134/91 152/97   2. Are you having any other symptoms (ex. Dizziness, headache, blurred vision, passed out)?  Dizziness   3. What is your BP issue?   Patient states her BP has been elevated, causing her ears to ring and dizziness. She states she saw her cancer doctor this morning and was advised to contact cardiology due to BP.

## 2021-08-06 NOTE — Progress Notes (Signed)
[place] Telephone:([phone]) [phone]   Fax:([phone]) [phone]  OFFICE PROGRESS NOTE  Janith Lima, MD [place] [address]  DIAGNOSIS: Recurrent non-Hodgkin lymphoma, follicular center cell type with predominant follicular pattern favor high-grade (grade 3/3) diagnosed in November 2012.  PRIOR THERAPY: 1) Status post treatment with Rituxan weekly for 3 doses in addition to 3 tablets of Afinitor at [facility] in [place].  2) Status post surgical excision of the right neck mass under the care of Dr. Johney Maine on 10/28/2012.  3)  Maintenance Rituxan 375 mg/M2 every 2 months status post 12 cycles, last dose was given 10/01/2014.  CURRENT THERAPY: Observation.  INTERVAL HISTORY: [name] 87 y.o. female returns to the clinic today for follow-up visit.  The patient is feeling fine today with no concerning complaints.  She denied having any current chest pain, shortness of breath, cough or hemoptysis.  She denied having any fever or chills.  She has no nausea, vomiting, diarrhea or constipation.  She has no headache or visual changes.  She denied having any significant weight loss or night sweats.  She was supposed to come back for follow-up visit in December but her appointment was not scheduled as ordered.  She is here today for reevaluation.   MEDICAL HISTORY: Past Medical History:  Diagnosis Date   Anemia    pt denies   Arthritis    Atrial fibrillation (HCC)    Cataract    bil   Common variable immunodeficiency (HCC)    Cystocele    history with rectocele, pt denies   GERD (gastroesophageal reflux disease)    H/O hiatal hernia    Hypertension    Hypothyroidism    Non Hodgkin's lymphoma ([place])    dx 2012 and now in remission   Osteoporosis    Pacemaker 09/15/2011   Medtronic Revo   Pericarditis    Pleural effusion, bilateral    history of   Pulmonary nodules    history of   Scoliosis    Varicose vein     ALLERGIES:  is  allergic to hydrocodone, amoxicillin, buprenorphine, codeine, demerol, dextromethorphan-guaifenesin, morphine and related, sulfonamide derivatives, and vicodin [hydrocodone-acetaminophen].  MEDICATIONS:  Current Outpatient Medications  Medication Sig Dispense Refill   acetaminophen (TYLENOL) 500 MG tablet Take 500 mg by mouth every 6 (six) hours as needed (for pain.).      Calcium Carbonate-Vitamin D 600-400 MG-UNIT tablet Take 1 tablet by mouth 2 (two) times daily.     denosumab (PROLIA) 60 MG/ML SOLN injection Inject 60 mg into the skin every 6 (six) months. Reported on 10/16/2015     EPINEPHrine 0.3 mg/0.3 mL IJ SOAJ injection Inject 0.3 mg into the muscle as needed for anaphylaxis.     ferrous fumarate (HEMOCYTE - 106 MG FE) 325 (106 Fe) MG TABS tablet Take 1 tablet by mouth daily.     gabapentin (NEURONTIN) 300 MG capsule Take 1 capsule (300 mg total) by mouth 4 (four) times daily. 360 capsule 1   Immune Globulin, Human, (HIZENTRA) 4 GM/20ML SOLN Inject 8 g into the skin once a week.     levothyroxine (SYNTHROID) 100 MCG tablet Take 1 tablet (100 mcg total) by mouth daily. 90 tablet 1   lisinopril (ZESTRIL) 10 MG tablet Take 1 tablet (10 mg total) by mouth daily. (Patient taking differently: Take 5 mg by mouth daily.) 90 tablet 3   Magnesium Oxide (MAG-OX 400 PO) Take 1 tablet by  mouth daily.     metoprolol tartrate (LOPRESSOR) 25 MG tablet Take 0.5 tablets (12.5 mg total) by mouth 2 (two) times daily. 15 tablet 0   olopatadine (PATANOL) 0.1 % ophthalmic solution Place 1 drop into both eyes 2 times daily. 5 mL 1   omeprazole (PRILOSEC) 40 MG capsule TAKE 1 CAPSULE(40 MG) BY MOUTH DAILY 90 capsule 0   potassium chloride (KLOR-CON) 10 MEQ tablet TAKE 1 TABLET(10 MEQ) BY MOUTH DAILY 90 tablet 0   thiamine 100 MG tablet Take 1 tablet (100 mg total) by mouth daily. 90 tablet 1   No current facility-administered medications for this visit.    SURGICAL HISTORY:  Past Surgical History:   Procedure Laterality Date   BREAST SURGERY  07/30/01   biopsy x 3, bilateral   CATARACT EXTRACTION Bilateral    COLONOSCOPY     ESOPHAGOGASTRODUODENOSCOPY (EGD) WITH PROPOFOL N/A 12/10/2020   Procedure: ESOPHAGOGASTRODUODENOSCOPY (EGD) WITH PROPOFOL;  Surgeon: Ronnette Juniper, MD;  Location: [place];  Service: Gastroenterology;  Laterality: N/A;  NG Tube Insertion   MASS EXCISION Right 10/28/2012   Procedure: Removal of mass on neck       ;  Surgeon: Adin Hector, MD;  Location: [place];  Service: General;  Laterality: Right;   PARTIAL HYSTERECTOMY  04/29/2004   PERMANENT PACEMAKER INSERTION N/A 09/15/2011   Procedure: PERMANENT PACEMAKER INSERTION;  Surgeon: Sanda Klein, MD;  Location: [place] CATH LAB;  Service: Cardiovascular;  Laterality: N/A;   RADIOACTIVE SEED GUIDED EXCISIONAL BREAST BIOPSY Left 08/11/2017   Procedure: LEFT RADIOACTIVE SEED GUIDED EXCISIONAL BREAST BIOPSY ERAS PATHWAY;  Surgeon: Stark Klein, MD;  Location: [place];  Service: General;  Laterality: Left;   TONSILLECTOMY      REVIEW OF SYSTEMS:  A comprehensive review of systems was negative.   PHYSICAL EXAMINATION: General appearance: alert, cooperative, and no distress Head: Normocephalic, without obvious abnormality, atraumatic Neck: no adenopathy, no JVD, supple, symmetrical, trachea midline, and thyroid not enlarged, symmetric, no tenderness/mass/nodules Lymph nodes: Cervical, supraclavicular, and axillary nodes normal. Resp: clear to auscultation bilaterally Back: symmetric, no curvature. ROM normal. No CVA tenderness. Cardio: regular rate and rhythm, S1, S2 normal, no murmur, click, rub or gallop GI: soft, non-tender; bowel sounds normal; no masses,  no organomegaly Extremities: extremities normal, atraumatic, no cyanosis or edema  ECOG PERFORMANCE STATUS: 1 - Symptomatic but completely ambulatory  Blood pressure (!) 157/108, pulse 87, temperature 97.7 F (36.5 C), resp. rate 18, weight 137 lb (62.1 kg), SpO2 96  %.  LABORATORY DATA: Lab Results  Component Value Date   WBC 10.0 06/03/2021   HGB 14.4 06/03/2021   HCT 44.0 06/03/2021   MCV 96.2 06/03/2021   PLT 280.0 06/03/2021      Chemistry      Component Value Date/Time   NA 137 06/03/2021 1054   NA 143 11/26/2017 1159   NA 143 06/16/2017 0921   K 4.7 06/03/2021 1054   K 3.8 06/16/2017 0921   CL 101 06/03/2021 1054   CL 109 (H) 12/14/2012 1009   CO2 27 06/03/2021 1054   CO2 28 06/16/2017 0921   BUN 20 06/03/2021 1054   BUN 18 11/26/2017 1159   BUN 11.7 06/16/2017 0921   CREATININE 0.86 06/03/2021 1054   CREATININE 0.80 06/14/2020 1335   CREATININE 0.8 06/16/2017 0921      Component Value Date/Time   CALCIUM 10.2 06/03/2021 1054   CALCIUM 9.7 06/16/2017 0921   ALKPHOS 74 06/03/2021 1054   ALKPHOS 63 06/16/2017  0921   AST 31 06/03/2021 1054   AST 28 06/14/2020 1335   AST 23 06/16/2017 0921   ALT 22 06/03/2021 1054   ALT 19 06/14/2020 1335   ALT 18 06/16/2017 0921   BILITOT 0.4 06/03/2021 1054   BILITOT 0.6 06/14/2020 1335   BILITOT 0.52 06/16/2017 0921       RADIOGRAPHIC STUDIES: CUP PACEART REMOTE DEVICE CHECK  Result Date: 07/15/2021 Scheduled monthly remote reviewed. Normal device function.  Battery voltage 2.82V 1 fast A/V episode, 9 sec. Next remote 31 days- JJB    ASSESSMENT AND PLAN:  This is a very pleasant 87 years old white female with recurrent non-Hodgkin lymphoma, follicular center cell type status post treatment with Rituxan as well as Afinitor at [facility] and also completed a course of maintenance Rituxan for 12 cycles last dose was given 10/01/2014. The patient has been on observation since that time and she has no concerning issues. I recommended for her to have repeat blood work as well as CT scan of the chest, abdomen pelvis in the next few days. If her scan showed no concerning findings for disease recurrence, I will see her back for follow-up visit in 1 year for evaluation with repeat lab and  imaging studies. The patient was advised to call immediately if she has any other concerning symptoms in the interval. The patient voices understanding of current disease status and treatment options and is in agreement with the current care plan. All questions were answered. The patient knows to call the clinic with any problems, questions or concerns. We can certainly see the patient much sooner if necessary.   Disclaimer: This note was dictated with voice recognition software. Similar sounding words can inadvertently be transcribed and may not be corrected upon review.

## 2021-08-06 NOTE — Telephone Encounter (Signed)
Spoke to patient she stated her B/P has been elevated since Lisinopril decreased to 10 mg daily.Readings listed below.Pulse ranging in 70's.Advised I will send message to Dr.Croitoru for advice.

## 2021-08-07 NOTE — Telephone Encounter (Addendum)
Spoke to patient Dr.Croitoru's advice given.She will increase Lisinopril to 20 mg daily.Stated she does have dizziness at times.She will continue to monitor B/P and call back if continues to be elevated and if dizziness does not improve.Appointment scheduled with Dr.Croitoru for a pacemaker check 12/01/21 at 10:00 am.

## 2021-08-07 NOTE — Telephone Encounter (Signed)
Her blood pressure is indeed too high.  Has she stopped having the symptoms of orthostatic dizziness? Please increase lisinopril back to 20 mg daily.

## 2021-08-08 ENCOUNTER — Encounter: Payer: Self-pay | Admitting: Cardiovascular Disease

## 2021-08-11 DIAGNOSIS — D839 Common variable immunodeficiency, unspecified: Secondary | ICD-10-CM | POA: Diagnosis not present

## 2021-08-11 MED ORDER — METOPROLOL TARTRATE 25 MG PO TABS: 25.0000 mg | ORAL_TABLET | Freq: Two times a day (BID) | ORAL | 3 refills | Status: DC

## 2021-08-11 MED ORDER — LISINOPRIL 40 MG PO TABS: 40.0000 mg | ORAL_TABLET | Freq: Every day | ORAL | 3 refills | Status: DC

## 2021-08-11 NOTE — Telephone Encounter (Signed)
Patient has been called and made aware. Metoprolol Tartrate 25 mg twice daily and Lisinopril 40 mg once daily have been sent in. She will continue to keep a log of the blood pressures and call back if needed.

## 2021-08-11 NOTE — Telephone Encounter (Signed)
Called the patient to discuss her MyChart message. She stated that she was still having high blood pressures. She increased the Lisinopril to 20 mg once daily on 2/9.  She stated that she is also having dizziness and light headedness when standing.  Blood pressures after medications: Friday: 178/106 Saturday: 144/106 Sunday: 152/97 Today: 178/104  The patient takes Tartrate 12.5 bid and Lisinopril 20 mg once daily. She has been advised to check her blood pressure when she goes from sitting to standing and keep a log of these as well.

## 2021-08-11 NOTE — Telephone Encounter (Signed)
Please increase the metoprolol to 25 mg twice daily and increase lisinopril to 40 mg once daily. Please ask her if she is taking any NSAIDs such as ibuprofen or naproxen. We checked for renal artery stenosis about 3 years ago, had a normal renal duplex ultrasound at that time..  She had labs on 08/06/2021 which showed no evidence of kidney problems and did not suggest a problem with aldosterone.

## 2021-08-12 ENCOUNTER — Encounter (HOSPITAL_COMMUNITY): Payer: Self-pay

## 2021-08-12 ENCOUNTER — Other Ambulatory Visit: Payer: Self-pay

## 2021-08-12 ENCOUNTER — Ambulatory Visit (HOSPITAL_COMMUNITY)
Admission: RE | Admit: 2021-08-12 | Discharge: 2021-08-12 | Disposition: A | Discharge status: Home / Self Care | Payer: Medicare Other | Source: Ambulatory Visit | Attending: Internal Medicine | Admitting: Internal Medicine

## 2021-08-12 DIAGNOSIS — K5900 Constipation, unspecified: Secondary | ICD-10-CM | POA: Diagnosis not present

## 2021-08-12 DIAGNOSIS — K7689 Other specified diseases of liver: Secondary | ICD-10-CM | POA: Diagnosis not present

## 2021-08-12 DIAGNOSIS — N2889 Other specified disorders of kidney and ureter: Secondary | ICD-10-CM | POA: Diagnosis not present

## 2021-08-12 DIAGNOSIS — I3139 Other pericardial effusion (noninflammatory): Secondary | ICD-10-CM | POA: Diagnosis not present

## 2021-08-12 DIAGNOSIS — K5730 Diverticulosis of large intestine without perforation or abscess without bleeding: Secondary | ICD-10-CM | POA: Diagnosis not present

## 2021-08-12 DIAGNOSIS — R918 Other nonspecific abnormal finding of lung field: Secondary | ICD-10-CM | POA: Diagnosis not present

## 2021-08-12 DIAGNOSIS — C8221 Follicular lymphoma grade III, unspecified, lymph nodes of head, face, and neck: Secondary | ICD-10-CM | POA: Insufficient documentation

## 2021-08-12 MED ORDER — IOHEXOL 300 MG/ML  SOLN
100.0000 mL | Freq: Once | INTRAMUSCULAR | Status: AC | PRN
  Administered 2021-08-12: 100 mL via INTRAVENOUS

## 2021-08-12 MED ORDER — SODIUM CHLORIDE (PF) 0.9 % IJ SOLN
INTRAMUSCULAR | Status: AC
  Filled 2021-08-12: qty 50

## 2021-08-13 ENCOUNTER — Ambulatory Visit (INDEPENDENT_AMBULATORY_CARE_PROVIDER_SITE_OTHER): Payer: Medicare Other

## 2021-08-13 DIAGNOSIS — I495 Sick sinus syndrome: Secondary | ICD-10-CM | POA: Diagnosis not present

## 2021-08-13 LAB — CUP PACEART REMOTE DEVICE CHECK
Battery Voltage: 2.82 V
Brady Statistic RA Percent Paced: 95.62 %
Date Time Interrogation Session: 20230215142410
Implantable Lead Implant Date: 20130319
Implantable Lead Implant Date: 20130319
Implantable Lead Location: 753859
Implantable Lead Location: 753860
Implantable Pulse Generator Implant Date: 20130319
Lead Channel Impedance Value: 384 Ohm
Lead Channel Impedance Value: 432 Ohm
Lead Channel Sensing Intrinsic Amplitude: 18.762 mV
Lead Channel Sensing Intrinsic Amplitude: 2.085 mV
Lead Channel Setting Pacing Amplitude: 2 V
Lead Channel Setting Sensing Sensitivity: 0.9 mV

## 2021-08-18 DIAGNOSIS — D839 Common variable immunodeficiency, unspecified: Secondary | ICD-10-CM | POA: Diagnosis not present

## 2021-08-18 NOTE — Progress Notes (Signed)
Remote pacemaker transmission.   

## 2021-08-21 ENCOUNTER — Telehealth (HOSPITAL_COMMUNITY): Payer: Self-pay

## 2021-08-21 NOTE — Telephone Encounter (Signed)
I had the pleasure of speaking with miss [name] today, to attempt to schedule her follow-up appointment with the PA under Dr. Stanford Breed. She declined the appointment stating that the doctor didnt meet her expectations of care.

## 2021-08-25 DIAGNOSIS — D839 Common variable immunodeficiency, unspecified: Secondary | ICD-10-CM | POA: Diagnosis not present

## 2021-08-27 ENCOUNTER — Ambulatory Visit
Admission: RE | Admit: 2021-08-27 | Discharge: 2021-08-27 | Disposition: A | Discharge status: Home / Self Care | Payer: Medicare Other | Source: Ambulatory Visit | Attending: Obstetrics and Gynecology | Admitting: Obstetrics and Gynecology

## 2021-08-27 DIAGNOSIS — N6002 Solitary cyst of left breast: Secondary | ICD-10-CM | POA: Diagnosis not present

## 2021-08-27 DIAGNOSIS — M170 Bilateral primary osteoarthritis of knee: Secondary | ICD-10-CM | POA: Diagnosis not present

## 2021-08-27 DIAGNOSIS — N63 Unspecified lump in unspecified breast: Secondary | ICD-10-CM

## 2021-08-29 ENCOUNTER — Telehealth: Payer: Self-pay | Admitting: Cardiovascular Disease

## 2021-08-29 NOTE — Telephone Encounter (Signed)
Chlorhexidine scrub left at front desk for patient. ?

## 2021-08-29 NOTE — Telephone Encounter (Signed)
?  Patient c/o Palpitations:  High priority if patient c/o lightheadedness, shortness of breath, or chest pain ? ?How long have you had palpitations/irregular HR/ Afib? Are you having the symptoms now? For the last couple months she has had some palpitations but last night she states it was bad and scary ? ? ?Are you currently experiencing lightheadedness, SOB or CP? Lightheadedness, some shortness of breath last night but not right now ? ?Do you have a history of afib (atrial fibrillation) or irregular heart rhythm? yes ? ?Have you checked your BP or HR? (document readings if available): this  morning 119/86 HR 65, yesterday morning 153/90 HR 80 ? ? ?Are you experiencing any other symptoms?  No  ?

## 2021-08-29 NOTE — Telephone Encounter (Signed)
Thanks Dr. Loletha Grayer, she's normally programmed AAIR when she went into back up mode she went to VVI 65 I am still waiting to hear from [facility] if we could program her back to AAI she has Revo, [facility] rep wasn't sure if we could program her back, will be good FYI to know in any case.  ?

## 2021-08-29 NOTE — Telephone Encounter (Signed)
Patient reports palpitations and quivering in her chest that has worsened over this week. She has light-headedness and sob going 300 ft to her mailbox. I had her do a device download. BP 129/87, P 65. Advised to go to the Ed if the light-headedness, palpitations, or sob worsens. She voiced understanding. Please advise. ?

## 2021-08-29 NOTE — Telephone Encounter (Signed)
Received this message from Theodoro Doing, RN. "Ms. Mcfarlane needs to either have an apt with him or come into device clinic on [address] her ppm has reached ERI and switched modes." Appointment made with Dr. Sallyanne Kuster for 3/10 at 8:30 am. ?

## 2021-08-29 NOTE — Telephone Encounter (Signed)
Thank you [name], I spoke with [name] and scheduled her for pacemaker generator changeout at 1:30PM on Monday. She has labs in Epic from 07/31/2021, so these do not need to be repeated. She is unable to come to the office today to pick up the scrub, but she can do that Monday morning. ?

## 2021-08-29 NOTE — Telephone Encounter (Signed)
I called her and gave her instructions to be at the hospital Monday at 11:30 AM for procedure scheduled for 1:30 PM.  She will be n.p.o. after an early breakfast at 7:30 AM.  If she can, I told her to come into our office Monday morning after 8 AM to pick up the bottle of chlorhexidine scrub.  Can we please leave that at the front desk for her? ?

## 2021-08-29 NOTE — Telephone Encounter (Signed)
I agree it would be nice to know.  Unfortunately she cannot come into the office before the end of business hours today anyway, so the best we can do is just change her out as quickly as possible.  She is scheduled for 1:30 on Monday. ?

## 2021-09-01 ENCOUNTER — Ambulatory Visit (HOSPITAL_COMMUNITY)
Admission: RE | Admit: 2021-09-01 | Discharge: 2021-09-01 | Disposition: A | Discharge status: Home / Self Care | Payer: Medicare Other | Attending: Cardiovascular Disease | Admitting: Cardiovascular Disease

## 2021-09-01 ENCOUNTER — Ambulatory Visit (HOSPITAL_COMMUNITY)
Admission: RE | Disposition: A | Discharge status: Home / Self Care | Payer: Self-pay | Source: Home / Self Care | Attending: Cardiovascular Disease

## 2021-09-01 ENCOUNTER — Other Ambulatory Visit: Payer: Self-pay

## 2021-09-01 DIAGNOSIS — I495 Sick sinus syndrome: Secondary | ICD-10-CM

## 2021-09-01 DIAGNOSIS — D839 Common variable immunodeficiency, unspecified: Secondary | ICD-10-CM | POA: Insufficient documentation

## 2021-09-01 DIAGNOSIS — Z79890 Hormone replacement therapy (postmenopausal): Secondary | ICD-10-CM | POA: Diagnosis not present

## 2021-09-01 DIAGNOSIS — E039 Hypothyroidism, unspecified: Secondary | ICD-10-CM | POA: Insufficient documentation

## 2021-09-01 DIAGNOSIS — I110 Hypertensive heart disease with heart failure: Secondary | ICD-10-CM | POA: Diagnosis not present

## 2021-09-01 DIAGNOSIS — K219 Gastro-esophageal reflux disease without esophagitis: Secondary | ICD-10-CM | POA: Insufficient documentation

## 2021-09-01 DIAGNOSIS — Z8571 Personal history of Hodgkin lymphoma: Secondary | ICD-10-CM | POA: Insufficient documentation

## 2021-09-01 DIAGNOSIS — I5032 Chronic diastolic (congestive) heart failure: Secondary | ICD-10-CM | POA: Insufficient documentation

## 2021-09-01 DIAGNOSIS — I4891 Unspecified atrial fibrillation: Secondary | ICD-10-CM | POA: Diagnosis not present

## 2021-09-01 DIAGNOSIS — Z45010 Encounter for checking and testing of cardiac pacemaker pulse generator [battery]: Secondary | ICD-10-CM

## 2021-09-01 DIAGNOSIS — Z79899 Other long term (current) drug therapy: Secondary | ICD-10-CM | POA: Diagnosis not present

## 2021-09-01 HISTORY — PX: PPM GENERATOR CHANGEOUT: EP1233

## 2021-09-01 SURGERY — PPM GENERATOR CHANGEOUT

## 2021-09-01 MED ORDER — LIDOCAINE HCL (PF) 1 % IJ SOLN
INTRAMUSCULAR | Status: AC
  Filled 2021-09-01: qty 60

## 2021-09-01 MED ORDER — LIDOCAINE HCL (PF) 1 % IJ SOLN
INTRAMUSCULAR | Status: DC | PRN
  Administered 2021-09-01: 45 mL

## 2021-09-01 MED ORDER — VANCOMYCIN HCL IN DEXTROSE 1-5 GM/200ML-% IV SOLN
INTRAVENOUS | Status: AC
  Filled 2021-09-01: qty 200

## 2021-09-01 MED ORDER — METOPROLOL TARTRATE 50 MG PO TABS: 50.0000 mg | ORAL_TABLET | Freq: Two times a day (BID) | ORAL | 3 refills | Status: DC

## 2021-09-01 MED ORDER — SODIUM CHLORIDE 0.9 % IV SOLN: INTRAVENOUS | Status: DC

## 2021-09-01 MED ORDER — CEFAZOLIN SODIUM-DEXTROSE 2-4 GM/100ML-% IV SOLN: 2.0000 g | INTRAVENOUS | Status: DC

## 2021-09-01 MED ORDER — CEFAZOLIN SODIUM-DEXTROSE 2-4 GM/100ML-% IV SOLN
INTRAVENOUS | Status: AC
  Filled 2021-09-01: qty 100

## 2021-09-01 MED ORDER — VANCOMYCIN HCL 10 G IV SOLR
INTRAVENOUS | Status: DC | PRN
  Administered 2021-09-01: 1500 mg via INTRAVENOUS

## 2021-09-01 MED ORDER — SODIUM CHLORIDE 0.9 % IV SOLN: 80.0000 mg | INTRAVENOUS | Status: DC

## 2021-09-01 MED ORDER — SODIUM CHLORIDE 0.9 % IV SOLN
INTRAVENOUS | Status: AC
  Filled 2021-09-01: qty 2

## 2021-09-01 SURGICAL SUPPLY — 7 items
CABLE SURGICAL S-101-97-12 (CABLE) ×2 IMPLANT
IPG PACE AZUR XT DR MRI W1DR01 (Pacemaker) IMPLANT
PACE AZURE XT DR MRI W1DR01 (Pacemaker) ×2 IMPLANT
PAD DEFIB RADIO PHYSIO CONN (PAD) ×2 IMPLANT
POUCH AIGIS-R ANTIBACT PPM (Mesh General) ×2 IMPLANT
POUCH AIGIS-R ANTIBACT PPM MED (Mesh General) IMPLANT
TRAY PACEMAKER INSERTION (PACKS) ×2 IMPLANT

## 2021-09-01 NOTE — Discharge Instructions (Signed)

## 2021-09-01 NOTE — Op Note (Signed)
Procedure report  ?Procedure performed:  ?1.  Dual-chamber permanent pacemaker generator changeout  ?2.  Aegis pouch ? ?Reason for procedure:  ?1. Device generator at elective replacement interval  ?2.  Sick sinus syndrome/sinus node arrest ?Procedure performed by:  ?Sanda Klein, MD  ?Complications:  ?None  ?Estimated blood loss:  ?<5 mL  ?Medications administered during procedure:  ?Vancomycin 1 g intravenously, lidocaine 1% 30 mL locally ?Device details:  ? ?New Generator Medtronic Azure XT DR MRI model number W1 DR 01, serial number [id number] ?Right atrial lead (chronic) Medtronic, model number [id number], serial number [id number] (implanted 09/15/2011) ?Right ventricular lead (chronic) Medtronic, model number [id number], serial number [id number] (implanted 09/15/2011) ? ?Explanted generator Medtronic Revo,  model number RV DR 01,  ? ?Procedure details:  ?After the risks and benefits of the procedure were discussed the patient provided informed consent. She was brought to the cardiac catheter lab in the fasting state. The patient was prepped and draped in usual sterile fashion. Local anesthesia with 1% lidocaine was administered to to the left infraclavicular area. A 5-6cm horizontal incision was made parallel with and 2-3 cm caudal to the left clavicle, in the area of an old scar.Using minimal electrocautery and mostly sharp and blunt dissection the prepectoral pocket was opened carefully to avoid injury to the loops of chronic leads. Extensive dissection was not necessary. The device was explanted. The pocket was carefully inspected for hemostasis and flushed with copious amounts of antibiotic solution. ? ?The leads were disconnected from the old generator and testing of the lead parameters later showed excellent values. The new generator was connected to the chronic leads, with appropriate pacing noted.  ? ?The entire system was then carefully inserted in the pocket with care been taking that the leads  and device assumed a comfortable position without pressure on the incision. Great care was taken that the leads be located deep to the generator. The pocket was then closed in layers using 2 layers of 2-0 Vicryl, 1 layer of 3-0 Vicryl and cutaneous Steri-Strips after which a sterile dressing was applied.  ? ?At the end of the procedure the following lead parameters were encountered:  ? ?Right atrial lead sensed P waves none detected, impedance 380 ohms, threshold 0.75 V at 0.4 ms pulse width. ? ?Right ventricular lead sensed R waves 19.6 mV, impedance 456 ohms, threshold 1.25 V at 0.4 ms pulse width. ? ?Sanda Klein, MD, [facility] ?[place] ?[phone] office ?[phone] pager ? ?

## 2021-09-01 NOTE — H&P (Signed)
Cardiology Admission History and Physical:   Patient ID: [name] MRN: [id number]; DOB: April 01, 1935   Admission date: 09/01/2021  PCP:  Janith Lima, MD   [facility] Providers Cardiologist:  Sanda Klein, MD        Chief Complaint: Pacemaker battery depletion  Patient Profile:   [name] is a 87 y.o. female with sinus node dysfunction and a history of chronic diastolic heart failure, essential hypertension who is being seen 09/01/2021 for the evaluation of pacemaker generator at elective replacement indicator.  History of Present Illness:   [name] began experiencing unpleasant palpitations a few days ago when her pacemaker mode switch to a VVI, as the generator had reached replacement trigger.  Usually her pacemaker is programmed AAIR at 60 bpm in an effort to reduce the need for ventricular pacing.  She has had very brief episodes of paroxysmal atrial tachycardia.  Her device is a Medtronic Revel device and both leads are Medtronic [id number] leads implanted in 2013.  In addition she is recently having some problems with elevated blood pressure for which we have adjusted her medications.  Otherwise review of systems is unremarkable and she specifically denies exertional dyspnea, angina, focal neurological complaints, lower extremity edema and intermittent claudication.  She has had problems with orthostatic hypotension in the past when her blood pressure was treated aggressively.  Additional medical problems include a history of Hodgkin's lymphoma lymph in remission, hypertension, diastolic heart failure precipitated by high-volume IVIG infusions, post pacemaker implantation atrial fibrillation due to pericarditis, treated hypothyroidism and GERD.  Past Medical History:  Diagnosis Date   Anemia    pt denies   Arthritis    Atrial fibrillation (HCC)    Cataract    bil   Common variable immunodeficiency (HCC)    Cystocele    history with rectocele, pt denies    GERD (gastroesophageal reflux disease)    H/O hiatal hernia    Hypertension    Hypothyroidism    Non Hodgkin's lymphoma ([place])    dx 2012 and now in remission   Osteoporosis    Pacemaker 09/15/2011   Medtronic Revo   Pericarditis    Pleural effusion, bilateral    history of   Pulmonary nodules    history of   Scoliosis    Varicose vein     Past Surgical History:  Procedure Laterality Date   BREAST SURGERY  07/30/01   biopsy x 3, bilateral   CATARACT EXTRACTION Bilateral    COLONOSCOPY     ESOPHAGOGASTRODUODENOSCOPY (EGD) WITH PROPOFOL N/A 12/10/2020   Procedure: ESOPHAGOGASTRODUODENOSCOPY (EGD) WITH PROPOFOL;  Surgeon: Ronnette Juniper, MD;  Location: [place];  Service: Gastroenterology;  Laterality: N/A;  NG Tube Insertion   MASS EXCISION Right 10/28/2012   Procedure: Removal of mass on neck       ;  Surgeon: Adin Hector, MD;  Location: [place];  Service: General;  Laterality: Right;   PARTIAL HYSTERECTOMY  04/29/2004   PERMANENT PACEMAKER INSERTION N/A 09/15/2011   Procedure: PERMANENT PACEMAKER INSERTION;  Surgeon: Sanda Klein, MD;  Location: [place] [facility];  Service: Cardiovascular;  Laterality: N/A;   RADIOACTIVE SEED GUIDED EXCISIONAL BREAST BIOPSY Left 08/11/2017   Procedure: LEFT RADIOACTIVE SEED GUIDED EXCISIONAL BREAST BIOPSY ERAS PATHWAY;  Surgeon: Stark Klein, MD;  Location: [place];  Service: General;  Laterality: Left;   TONSILLECTOMY       Medications Prior to Admission: Prior to Admission medications   Medication Sig Start Date End  Date Taking? Authorizing Provider  acetaminophen (TYLENOL) 500 MG tablet Take 500 mg by mouth every 6 (six) hours as needed (for pain.).    Yes [provider]  Calcium Carbonate-Vitamin D 600-400 MG-UNIT tablet Take 1 tablet by mouth daily.   Yes [provider]  dexlansoprazole (DEXILANT) 60 MG capsule Take 60 mg by mouth daily. 08/08/21  Yes [provider]  EPINEPHrine 0.3 mg/0.3 mL IJ SOAJ injection  Inject 0.3 mg into the muscle as needed for anaphylaxis. 10/23/20  Yes [provider]  ferrous fumarate (HEMOCYTE - 106 MG FE) 325 (106 Fe) MG TABS tablet Take 1 tablet by mouth daily.   Yes [provider]  gabapentin (NEURONTIN) 300 MG capsule Take 1 capsule (300 mg total) by mouth 4 (four) times daily. 10/09/20 11/01/21 Yes Janith Lima, MD  Immune Globulin, Human, (HIZENTRA) 4 GM/20ML SOLN Inject 8 g into the skin once a week. Thursday's   Yes [provider]  levothyroxine (SYNTHROID) 100 MCG tablet Take 1 tablet (100 mcg total) by mouth daily. 05/19/21  Yes Janith Lima, MD  lisinopril (ZESTRIL) 40 MG tablet Take 1 tablet (40 mg total) by mouth daily. 08/11/21  Yes Kemp Gomes, MD  Magnesium Oxide (MAG-OX 400 PO) Take 400 mg by mouth 2 (two) times a week.   Yes [provider]  metoprolol tartrate (LOPRESSOR) 25 MG tablet Take 1 tablet (25 mg total) by mouth 2 (two) times daily. 08/11/21  Yes Ryon Layton, MD  omeprazole (PRILOSEC) 40 MG capsule TAKE 1 CAPSULE(40 MG) BY MOUTH DAILY Patient taking differently: Take 40 mg by mouth daily. 04/08/21  Yes Janith Lima, MD  potassium chloride (KLOR-CON) 10 MEQ tablet TAKE 1 TABLET(10 MEQ) BY MOUTH DAILY Patient taking differently: Take 10 mEq by mouth daily. 06/24/20  Yes Lujain Kraszewski, MD  thiamine 100 MG tablet Take 1 tablet (100 mg total) by mouth daily. 06/08/21  Yes Janith Lima, MD     Allergies:    Allergies  Allergen Reactions   Amoxicillin Hives    Has patient had a PCN reaction causing immediate rash, facial/tongue/throat swelling, SOB or lightheadedness with hypotension: No Has patient had a PCN reaction causing severe rash involving mucus membranes or skin necrosis: No Has patient had a PCN reaction that required hospitalization: No Has patient had a PCN reaction occurring within the last 10 years: No If all of the above answers are "NO", then may proceed with Cephalosporin use.     Buprenorphine Hives   Codeine Hives and Other (See Comments)    "makes her crazy"    Demerol Hives   Dextromethorphan-Guaifenesin     insomnia   Morphine And Related Hives   Sulfonamide Derivatives Rash   Vicodin [Hydrocodone-Acetaminophen] Hives    Social History:   Social History   Socioeconomic History   Marital status: Widowed    Spouse name: Not on file   Number of children: 3   Years of education: Not on file   Highest education level: Not on file  Occupational History   Occupation: [employer]  Tobacco Use   Smoking status: Never   Smokeless tobacco: Never  Vaping Use   Vaping Use: Never used  Substance and Sexual Activity   Alcohol use: Yes    Comment: 1 glass of wine occasionally   Drug use: No   Sexual activity: Not on file  Other Topics Concern   Not on file  Social History Narrative   She currently  works as an Producer, 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  She lives alone in a 3-story home.  There is a lift chair which she does not use (placed for her husband).      Right handed    Social Determinants of Health   Financial Resource Strain: Not on file  Food Insecurity: Not on file  Transportation Needs: Not on file  Physical Activity: Not on file  Stress: Not on file  Social Connections: Not on file  Intimate Partner Violence: Not on file    Family History:   The patient's family history includes Breast cancer in her sister; Heart disease in her brother and daughter; Lymphoma in her daughter; Stroke in her father and mother. There is no history of Colon cancer, Esophageal cancer, Pancreatic cancer, Stomach cancer, or Liver disease.    ROS:  Please see the history of present illness.  All other ROS reviewed and negative.     Physical Exam/Data:   Vitals:   09/01/21 1104  BP: (!) 150/87  Pulse: 67  Resp: 16  Temp: 98 F (36.7 C)  TempSrc: Oral  SpO2: 97%  Weight: 61.2 kg  Height: $Remove'5\' 4"'TCcQXXW$  (1.626 m)   No  intake or output data in the 24 hours ending 09/01/21 1217 Last 3 Weights 09/01/2021 08/06/2021 07/01/2021  Weight (lbs) 135 lb 137 lb 132 lb  Weight (kg) 61.236 kg 62.143 kg 59.875 kg     Body mass index is 23.17 kg/m.  General:  Well nourished, well developed, in no acute distress.  Healthy pacemaker site in left subclavian area. HEENT: normal Neck: no JVD.  Can a waves are seen. Vascular: No carotid bruits; Distal pulses 2+ bilaterally   Cardiac:  normal S1, S2; RRR; no murmur  Lungs:  clear to auscultation bilaterally, no wheezing, rhonchi or rales  Abd: soft, nontender, no hepatomegaly  Ext: no edema Musculoskeletal:  No deformities, BUE and BLE strength normal and equal Skin: warm and dry  Neuro:  CNs 2-12 intact, no focal abnormalities noted Psych:  Normal affect    EKG:  The ECG that was done 06/09/2021  was personally reviewed and demonstrates atrial paced, ventricular sensed rhythm  Relevant CV Studies:   Laboratory Data: BMET    Component Value Date/Time   NA 142 08/06/2021 0925   NA 143 11/26/2017 1159   NA 143 06/16/2017 0921   K 4.1 08/06/2021 0925   K 3.8 06/16/2017 0921   CL 106 08/06/2021 0925   CL 109 (H) 12/14/2012 1009   CO2 29 08/06/2021 0925   CO2 28 06/16/2017 0921   GLUCOSE 89 08/06/2021 0925   GLUCOSE 69 (L) 06/16/2017 0921   GLUCOSE 64 (L) 12/14/2012 1009   BUN 14 08/06/2021 0925   BUN 18 11/26/2017 1159   BUN 11.7 06/16/2017 0921   CREATININE 0.75 08/06/2021 0925   CREATININE 0.8 06/16/2017 0921   CALCIUM 10.0 08/06/2021 0925   CALCIUM 9.7 06/16/2017 0921   EGFR >60 06/16/2017 0921   GFRNONAA >60 08/06/2021 0925   GFRNONAA >60 10/16/2010 1013   CBC:    Component Value Date/Time   WBC 7.0 08/06/2021 0925   WBC 10.0 06/03/2021 1054   HGB 13.9 08/06/2021 0925   HGB 15.5 06/16/2017 0921   HCT 41.0 08/06/2021 0925   HCT 46.4 06/16/2017 0921   PLT 217 08/06/2021 0925   PLT 198 06/16/2017 0921   PLT 239 04/07/2017 1114   MCV 95.3  08/06/2021 0925   MCV 96.1 06/16/2017  0921   NEUTROABS 4.0 08/06/2021 0925   NEUTROABS 5.4 06/16/2017 0921   LYMPHSABS 2.3 08/06/2021 0925   LYMPHSABS 1.7 06/16/2017 0921   MONOABS 0.4 08/06/2021 0925   MONOABS 0.7 06/16/2017 0921   EOSABS 0.2 08/06/2021 0925   EOSABS 0.1 06/16/2017 0921   BASOSABS 0.1 08/06/2021 0925   BASOSABS 0.1 06/16/2017 0921     High Sensitivity Troponin:  No results for input(s): TROPONINIHS in the last 720 hours.    ChemistryNo results for input(s): NA, K, CL, CO2, GLUCOSE, BUN, CREATININE, CALCIUM, MG, GFRNONAA, GFRAA, ANIONGAP in the last 168 hours.  No results for input(s): PROT, ALBUMIN, AST, ALT, ALKPHOS, BILITOT in the last 168 hours. Lipids No results for input(s): CHOL, TRIG, HDL, LABVLDL, LDLCALC, CHOLHDL in the last 168 hours. HematologyNo results for input(s): WBC, RBC, HGB, HCT, MCV, MCH, MCHC, RDW, PLT in the last 168 hours. Thyroid No results for input(s): TSH, FREET4 in the last 168 hours. BNPNo results for input(s): BNP, PROBNP in the last 168 hours.  DDimer No results for input(s): DDIMER in the last 168 hours.   Radiology/Studies:  No results found.   Assessment and Plan:   Pacemaker battery depletion: She continues to need dual-chamber permanent pacemaker therapy.  The generator change out procedure has been discussed in detail.  She has provided informed consent.  Plan discharge later today if the procedure is not complicated.   Risk Assessment/Risk Scores:            For questions or updates, please contact [place] Please consult [email or web address] for contact info under     Signed, Sanda Klein, MD  09/01/2021 12:17 PM

## 2021-09-02 ENCOUNTER — Encounter (HOSPITAL_COMMUNITY): Payer: Self-pay | Admitting: Cardiovascular Disease

## 2021-09-02 MED FILL — Cefazolin Sodium-Dextrose IV Solution 2 GM/100ML-4%: INTRAVENOUS | Qty: 100 | Status: AC

## 2021-09-02 MED FILL — Gentamicin Sulfate Inj 40 MG/ML: INTRAMUSCULAR | Qty: 80 | Status: AC

## 2021-09-04 ENCOUNTER — Other Ambulatory Visit: Payer: Self-pay | Admitting: Internal Medicine

## 2021-09-04 DIAGNOSIS — E039 Hypothyroidism, unspecified: Secondary | ICD-10-CM

## 2021-09-05 ENCOUNTER — Ambulatory Visit: Payer: Medicare Other | Admitting: Cardiovascular Disease

## 2021-09-07 ENCOUNTER — Other Ambulatory Visit: Payer: Self-pay | Admitting: Internal Medicine

## 2021-09-07 DIAGNOSIS — G6289 Other specified polyneuropathies: Secondary | ICD-10-CM

## 2021-09-08 DIAGNOSIS — D839 Common variable immunodeficiency, unspecified: Secondary | ICD-10-CM | POA: Diagnosis not present

## 2021-09-09 DIAGNOSIS — J069 Acute upper respiratory infection, unspecified: Secondary | ICD-10-CM | POA: Diagnosis not present

## 2021-09-09 DIAGNOSIS — J302 Other seasonal allergic rhinitis: Secondary | ICD-10-CM | POA: Diagnosis not present

## 2021-09-09 DIAGNOSIS — J209 Acute bronchitis, unspecified: Secondary | ICD-10-CM | POA: Diagnosis not present

## 2021-09-11 ENCOUNTER — Other Ambulatory Visit: Payer: Self-pay

## 2021-09-11 ENCOUNTER — Ambulatory Visit (INDEPENDENT_AMBULATORY_CARE_PROVIDER_SITE_OTHER): Payer: Medicare Other

## 2021-09-11 DIAGNOSIS — I495 Sick sinus syndrome: Secondary | ICD-10-CM | POA: Diagnosis not present

## 2021-09-11 LAB — CUP PACEART INCLINIC DEVICE CHECK
Battery Remaining Longevity: 145 mo
Battery Voltage: 3.22 V
Brady Statistic AP VP Percent: 0.23 %
Brady Statistic AP VS Percent: 98.81 %
Brady Statistic AS VP Percent: 0 %
Brady Statistic AS VS Percent: 0.95 %
Brady Statistic RA Percent Paced: 99.19 %
Brady Statistic RV Percent Paced: 0.23 %
Date Time Interrogation Session: 20230316152202
Implantable Lead Implant Date: 20130319
Implantable Lead Implant Date: 20130319
Implantable Lead Location: 753859
Implantable Lead Location: 753860
Implantable Pulse Generator Implant Date: 20230306
Lead Channel Impedance Value: 323 Ohm
Lead Channel Impedance Value: 399 Ohm
Lead Channel Impedance Value: 418 Ohm
Lead Channel Impedance Value: 456 Ohm
Lead Channel Pacing Threshold Amplitude: 0.625 V
Lead Channel Pacing Threshold Amplitude: 0.875 V
Lead Channel Pacing Threshold Pulse Width: 0.4 ms
Lead Channel Pacing Threshold Pulse Width: 0.4 ms
Lead Channel Sensing Intrinsic Amplitude: 1.25 mV
Lead Channel Sensing Intrinsic Amplitude: 1.25 mV
Lead Channel Sensing Intrinsic Amplitude: 18.625 mV
Lead Channel Sensing Intrinsic Amplitude: 19.625 mV
Lead Channel Setting Pacing Amplitude: 2.25 V
Lead Channel Setting Pacing Amplitude: 3.5 V
Lead Channel Setting Pacing Pulse Width: 0.4 ms
Lead Channel Setting Sensing Sensitivity: 0.9 mV

## 2021-09-11 NOTE — Progress Notes (Signed)

## 2021-09-11 NOTE — Patient Instructions (Signed)

## 2021-09-13 NOTE — Telephone Encounter (Signed)
EVENITY VOB initiated via [email or web address] ? ?New start ? ?

## 2021-09-16 NOTE — Telephone Encounter (Signed)
Prior auth required for EVENITY ? ?PA PROCESS DETAILS: PA is required and is currently not on file. Please call Medical Review at [phone]) ?[phone] to initiate PA. ? ?

## 2021-09-22 ENCOUNTER — Telehealth: Payer: Self-pay | Admitting: Cardiovascular Disease

## 2021-09-22 NOTE — Telephone Encounter (Signed)
Pt called to report that she has been having a "quivering" sensation everyday all day throughout her body mostly on the left side ... she denies fever, and any other changes... she says it is not related to not eating... she has been eating and drinking normally... she says it has been bothering at night and she cannot sleep... she denies any other symptoms associated with it/ no palpitations, no SOB, no chest pain.. she thinks it started around the time of her pacemaker change out 09/01/21... I have asked her to call her PCP Dr. Ronnald Ramp and she says she will but she does not think he will "do anything"... I asked her to ask him about the Gabapentin that she takes for the neuropathy in her feet... she says she will call him about it.  ? ?I will also forward top Dr. Sallyanne Kuster, I advised her that he is out of the office this week but I will forward to him for his review, we will call her back and let her know if he has any recommendations.  ? ?

## 2021-09-22 NOTE — Telephone Encounter (Signed)
Patient states she had surgery 3/16 and says since then she has had episodes of quivering. She says it is just the left side of her body, but it happens pretty constantly. She says it may stop for a couple hours, but then starts again. She says she has no other symptoms.  ?

## 2021-09-23 DIAGNOSIS — D839 Common variable immunodeficiency, unspecified: Secondary | ICD-10-CM | POA: Diagnosis not present

## 2021-09-30 DIAGNOSIS — D839 Common variable immunodeficiency, unspecified: Secondary | ICD-10-CM | POA: Diagnosis not present

## 2021-09-30 NOTE — Telephone Encounter (Signed)
Those BP readings are so surprisingly high. Have we checked her home BP monitor, to know it is accurate? ?If yes, please add amlodipine 5 mg daily. ?I am very puzzled by the quivering sensation she describes. Can't think of a clear connection to the pacemaker, but would like to recheck the device in the office. When is her next appt? ?

## 2021-09-30 NOTE — Telephone Encounter (Signed)
Called the patient to check on her. She stated that she has been having "quivering" at her site of the pacemaker. Procedure was 3/6. She stated that the quivering has gotten better lately and is now sporadic. She denies pain, redness and swelling at the site. She has been advised that this could be part of the healing process with the nerves in that area. ? ?She has also had elevated blood pressures. She denies increased sodium intake and anything other changes that would cause a blood pressure increase. She is asymptomatic.  ? ?She is currently taking: ?Lisinopril 40 mg ?Metoprolol 50 mg bid ? ?Blood pressure after medication: ?164/109 ?150/110 ? ? ?

## 2021-10-01 MED ORDER — AMLODIPINE BESYLATE 5 MG PO TABS: 5.0000 mg | ORAL_TABLET | Freq: Every day | ORAL | 0 refills | Status: DC

## 2021-10-01 NOTE — Telephone Encounter (Signed)
Called the patient. She stated that her blood pressure has still been elevated today. Her cuff is a new one so it is accurate.  ? ?Amlodipine 5 mg once daily has been sent in. She is going out of town through next Tuesday. She has been advised to check her blood pressure daily while she is gone and keep a log.  ? ?Appointment made for 10/08/21 with Dr. Sallyanne Kuster. ?

## 2021-10-03 ENCOUNTER — Other Ambulatory Visit: Payer: Self-pay

## 2021-10-03 MED ORDER — AMLODIPINE BESYLATE 5 MG PO TABS: 5.0000 mg | ORAL_TABLET | Freq: Every day | ORAL | 0 refills | Status: DC

## 2021-10-04 NOTE — Telephone Encounter (Signed)
Prior auth initiated via [facility] provider portal ?Case id# [id number] ? ? ?

## 2021-10-06 ENCOUNTER — Telehealth: Payer: Self-pay | Admitting: Internal Medicine

## 2021-10-06 NOTE — Telephone Encounter (Signed)
[name] (Pharmicist) with [facility] calls today in regards to PT. Erica states that PT had been approved for Prolia but has had a request put in for that to be switched to [place]. Erica can approve this switch if the proper information is sent into her within 18 hours. If it has not been sent in she will have to deny the current approval for the Prolia and a new approval for Evenity must be put in.  ? ?CB for [place]: [phone]  ?EXT: [id number] ?

## 2021-10-07 DIAGNOSIS — D839 Common variable immunodeficiency, unspecified: Secondary | ICD-10-CM | POA: Diagnosis not present

## 2021-10-08 ENCOUNTER — Ambulatory Visit (INDEPENDENT_AMBULATORY_CARE_PROVIDER_SITE_OTHER): Payer: Medicare Other | Admitting: Cardiovascular Disease

## 2021-10-08 ENCOUNTER — Encounter: Payer: Self-pay | Admitting: Cardiovascular Disease

## 2021-10-08 VITALS — BP 132/90 | HR 87 | Ht 63.0 in | Wt 137.4 lb

## 2021-10-08 DIAGNOSIS — I5032 Chronic diastolic (congestive) heart failure: Secondary | ICD-10-CM

## 2021-10-08 DIAGNOSIS — R252 Cramp and spasm: Secondary | ICD-10-CM | POA: Diagnosis not present

## 2021-10-08 DIAGNOSIS — I471 Supraventricular tachycardia: Secondary | ICD-10-CM

## 2021-10-08 DIAGNOSIS — I495 Sick sinus syndrome: Secondary | ICD-10-CM | POA: Diagnosis not present

## 2021-10-08 DIAGNOSIS — Z8679 Personal history of other diseases of the circulatory system: Secondary | ICD-10-CM

## 2021-10-08 DIAGNOSIS — I10 Essential (primary) hypertension: Secondary | ICD-10-CM

## 2021-10-08 DIAGNOSIS — Z950 Presence of cardiac pacemaker: Secondary | ICD-10-CM

## 2021-10-08 NOTE — Patient Instructions (Signed)
Medication Instructions:  ?No changes ?*If you need a refill on your cardiac medications before your next appointment, please call your pharmacy* ? ? ?Lab Work: ?None ordered ?If you have labs (blood work) drawn today and your tests are completely normal, you will receive your results only by: ?MyChart Message (if you have MyChart) OR ?A paper copy in the mail ?If you have any lab test that is abnormal or we need to change your treatment, we will call you to review the results. ? ? ?Testing/Procedures: ?None ordered ? ? ?Follow-Up: ?At [facility], you and your health needs are our priority.  As part of our continuing mission to provide you with exceptional heart care, we have created designated Provider Care Teams.  These Care Teams include your primary Cardiologist (physician) and Advanced Practice Providers (APPs -  Physician Assistants and Nurse Practitioners) who all work together to provide you with the care you need, when you need it. ? ?We recommend signing up for the patient portal called "MyChart".  Sign up information is provided on this After Visit Summary.  MyChart is used to connect with patients for Virtual Visits (Telemedicine).  Patients are able to view lab/test results, encounter notes, upcoming appointments, etc.  Non-urgent messages can be sent to your provider as well.   ?To learn more about what you can do with MyChart, go to [email or web address].   ? ?Your next appointment:   ?Keep your follow up with Dr. Sallyanne Kuster on 6/ 26/ 23 ? ? ?Important Information About Sugar ? ? ? ? ? ? ?

## 2021-10-08 NOTE — Telephone Encounter (Signed)
Prior auth APPROVED ?PA# [id number] ? ? ?

## 2021-10-08 NOTE — Progress Notes (Signed)
? ?Cardiology Office Note   ? ?Date:  10/12/2021  ? ?ID:  HAIVEN Foster, DOB 11/23/1934, MRN [id number] ? ?PCP:  Janith Lima, MD  ?Cardiologist:   Sanda Klein, MD  ? ?Chief Complaint  ?Patient presents with  ? Pacemaker Problem  ? ? ?History of Present Illness:  ?[name] is a 87 y.o. female with sinus node dysfunction and implantation of a dual-chamber permanent pacemaker (Medtronic Revo 2013, generator change 09/01/2021), non-Hodgkin's lymphoma in remission.  She has to be seen due to complaints of a quivering sensation in her left chest in the axilla and underneath the left breast that began after the pacemaker change out procedure.  However, over the last week or so this unusual quivering has resolved.  It sounds like she was describing muscle spasms.  The symptoms do not sound consistent with skeletal muscle pacemaker stimulation.  They also did not sound like palpitations. ? ?Pacemaker function is normal.  The new generator is beginning of life.  The site is healed very nicely without any redness, tenderness or drainage.  She has virtually 100% atrial paced, ventricular sensed rhythm (99.6 atrial pacing, 0.3% ventricular pacing) and has not had any episodes of mode switch or high ventricular rates.  Lead parameters are all normal. ? ?The patient specifically denies any chest pain at rest exertion, dyspnea at rest or with exertion, orthopnea, paroxysmal nocturnal dyspnea, syncope, palpitations, focal neurological deficits, intermittent claudication, lower extremity edema, unexplained weight gain, cough, hemoptysis or wheezing.  Orthostatic dizziness, which has played there in the past has not been a problem recently.  In fact her blood pressure has been high. ? ? ?Shortly after pacemaker implantation she had problems with paroxysmal atrial fibrillation likely related to microperforation and acute pericarditis.  For many years or after she has not had recurrent atrial fibrillation. She did  have some shortness of breath in 2018 that responded promptly to diuretics. September 2019, she had a single syncopal events shortly after arriving in [place] after a long airline trip. Echo showed normal left ventricular systolic function but evidence of diastolic dysfunction and increased filling pressure.  She developed problems with hypervolemia while receiving IVIG (Hizentra) infusions, improved when this was switched to the low volume subcutaneous injections. ? ? ?Past Medical History:  ?Diagnosis Date  ? Anemia   ? pt denies  ? Arthritis   ? Atrial fibrillation ([place])   ? Cataract   ? bil  ? Common variable immunodeficiency ([place])   ? Cystocele   ? history with rectocele, pt denies  ? GERD (gastroesophageal reflux disease)   ? H/O hiatal hernia   ? Hypertension   ? Hypothyroidism   ? Non Hodgkin's lymphoma ([place])   ? dx 2012 and now in remission  ? Osteoporosis   ? Pacemaker 09/15/2011  ? Medtronic Revo  ? Pericarditis   ? Pleural effusion, bilateral   ? history of  ? Pulmonary nodules   ? history of  ? Scoliosis   ? Varicose vein   ? ? ?Past Surgical History:  ?Procedure Laterality Date  ? BREAST SURGERY  07/30/01  ? biopsy x 3, bilateral  ? CATARACT EXTRACTION Bilateral   ? COLONOSCOPY    ? ESOPHAGOGASTRODUODENOSCOPY (EGD) WITH PROPOFOL N/A 12/10/2020  ? Procedure: ESOPHAGOGASTRODUODENOSCOPY (EGD) WITH PROPOFOL;  Surgeon: Ronnette Juniper, MD;  Location: [facility];  Service: Gastroenterology;  Laterality: N/A;  NG Tube Insertion  ? MASS EXCISION Right 10/28/2012  ? Procedure: Removal of mass on neck       ;  Surgeon: Adin Hector, MD;  Location: [facility];  Service: General;  Laterality: Right;  ? PARTIAL HYSTERECTOMY  04/29/2004  ? PERMANENT PACEMAKER INSERTION N/A 09/15/2011  ? Procedure: PERMANENT PACEMAKER INSERTION;  Surgeon: Sanda Klein, MD;  Location: [facility];  Service: Cardiovascular;  Laterality: N/A;  ? PPM GENERATOR CHANGEOUT N/A 09/01/2021  ? Procedure: PPM GENERATOR CHANGEOUT;  Surgeon: Sanda Klein,  MD;  Location: [facility];  Service: Cardiovascular;  Laterality: N/A;  ? RADIOACTIVE SEED GUIDED EXCISIONAL BREAST BIOPSY Left 08/11/2017  ? Procedure: LEFT RADIOACTIVE SEED GUIDED EXCISIONAL BREAST BIOPSY ERAS PATHWAY;  Surgeon: Stark Klein, MD;  Location: [facility];  Service: General;  Laterality: Left;  ? TONSILLECTOMY    ? ? ?Current Medications: ?Outpatient Medications Prior to Visit  ?Medication Sig Dispense Refill  ? acetaminophen (TYLENOL) 500 MG tablet Take 500 mg by mouth every 6 (six) hours as needed (for pain.).     ? amLODipine (NORVASC) 5 MG tablet Take 1 tablet (5 mg total) by mouth daily. 90 tablet 0  ? Calcium Carbonate-Vitamin D 600-400 MG-UNIT tablet Take 1 tablet by mouth daily.    ? dexlansoprazole (DEXILANT) 60 MG capsule Take 60 mg by mouth daily.    ? ferrous fumarate (HEMOCYTE - 106 MG FE) 325 (106 Fe) MG TABS tablet Take 1 tablet by mouth daily.    ? gabapentin (NEURONTIN) 300 MG capsule TAKE 1 CAPSULE(300 MG) BY MOUTH FOUR TIMES DAILY 360 capsule 1  ? Immune Globulin, Human, (HIZENTRA) 4 GM/20ML SOLN Inject 8 g into the skin once a week. Thursday's    ? levothyroxine (SYNTHROID) 100 MCG tablet TAKE 1 TABLET(100 MCG) BY MOUTH DAILY 90 tablet 0  ? lisinopril (ZESTRIL) 40 MG tablet Take 1 tablet (40 mg total) by mouth daily. 30 tablet 3  ? Magnesium Oxide (MAG-OX 400 PO) Take 400 mg by mouth 2 (two) times a week.    ? metoprolol tartrate (LOPRESSOR) 50 MG tablet Take 1 tablet (50 mg total) by mouth 2 (two) times daily. 180 tablet 3  ? omeprazole (PRILOSEC) 40 MG capsule TAKE 1 CAPSULE(40 MG) BY MOUTH DAILY (Patient taking differently: Take 40 mg by mouth daily.) 90 capsule 0  ? potassium chloride (KLOR-CON) 10 MEQ tablet TAKE 1 TABLET(10 MEQ) BY MOUTH DAILY (Patient taking differently: Take 10 mEq by mouth daily.) 90 tablet 0  ? thiamine 100 MG tablet Take 1 tablet (100 mg total) by mouth daily. 90 tablet 1  ? EPINEPHrine 0.3 mg/0.3 mL IJ SOAJ injection Inject 0.3 mg into the muscle as  needed for anaphylaxis.    ? ?No facility-administered medications prior to visit.  ?  ? ?Allergies:   Amoxicillin, Buprenorphine, Codeine, Demerol, Dextromethorphan-guaifenesin, Morphine and related, Sulfonamide derivatives, and Vicodin [hydrocodone-acetaminophen]  ? ?Social History  ? ?Socioeconomic History  ? Marital status: Widowed  ?  Spouse name: Not on file  ? Number of children: 3  ? Years of education: Not on file  ? Highest education level: Not on file  ?Occupational History  ? Occupation: [employer]  ?Tobacco Use  ? Smoking status: Never  ? Smokeless tobacco: Never  ?Vaping Use  ? Vaping Use: Never used  ?Substance and Sexual Activity  ? Alcohol use: Yes  ?  Comment: 1 glass of wine occasionally  ? Drug use: No  ? Sexual activity: Not on file  ?Other Topics Concern  ? Not on file  ?Social History Narrative  ? She currently works as an Metallurgist  center for mentally ill.  She lives alone in a 3-story home.  There is a lift chair which she does not use (placed for her husband).  ?   ? Right handed   ? ?Social Determinants of Health  ? ?Financial Resource Strain: Not on file  ?Food Insecurity: Not on file  ?Transportation Needs: Not on file  ?Physical Activity: Not on file  ?Stress: Not on file  ?Social Connections: Not on file  ?  ? ?Family History:  The patient's family history includes Breast cancer in her sister; Heart disease in her brother and daughter; Lymphoma in her daughter; Stroke in her father and mother.  ? ?ROS:   ?Please see the history of present illness.    ?ROS All other systems reviewed and are negative. ? ? ?PHYSICAL EXAM:   ?VS:  BP 132/90 (BP Location: Left Arm, Patient Position: Sitting, Cuff Size: Normal)   Pulse 87   Ht '5\' 3"'$  (1.6 m)   Wt 137 lb 6.4 oz (62.3 kg)   SpO2 96%   BMI 24.34 kg/m?    ? ? ?General: Alert, oriented x3, no distress, well-healed left subclavian pacemaker site without redness, tenderness, drainage or swelling.  Tiny  suture material fragments at the outer edges of the incision. ?Head: no evidence of trauma, PERRL, EOMI, no exophtalmos or lid lag, no myxedema, no xanthelasma; normal ears, nose and oropharynx ?Neck: normal

## 2021-10-08 NOTE — Telephone Encounter (Signed)
**  Need to confirm prior auth dates of validity.  ? ?Pt ready for scheduling on or after 10/08/21 ? ?Out-of-pocket cost due at time of visit: $ ? ?Primary: [employer] Medicare ?Prolia co-insurance: 20% (approximately $) ?Admin fee co-insurance: 20% (approximately $) ? ?Secondary: n/a ?Prolia co-insurance:  ?Admin fee co-insurance:  ? ?Deductible: does not apply ? ?Prior Auth: APPROVED ?PA# [id number] ?Valid: 10/04/21-10/04/21 ? ?** This summary of benefits is an estimation of the patient's out-of-pocket cost. Exact cost may vary based on individual plan coverage.   ?

## 2021-10-09 NOTE — Telephone Encounter (Signed)
Edgar Frisk 3 days ago  ? ?ZS ?Melissa Marino (Pharmicist) with [employer] calls today in regards to PT. Melissa states that PT had been approved for Prolia but has had a request put in for that to be switched to [place]. Melissa can approve this switch if the proper information is sent into her within 18 hours. If it has not been sent in she will have to deny the current approval for the Prolia and a new approval for Evenity must be put in.  ?  ?CB for [facility]: [phone]  ?EXT: [id number]  ?  ? ?

## 2021-10-12 ENCOUNTER — Encounter: Payer: Self-pay | Admitting: Cardiovascular Disease

## 2021-10-14 DIAGNOSIS — D839 Common variable immunodeficiency, unspecified: Secondary | ICD-10-CM | POA: Diagnosis not present

## 2021-10-16 DIAGNOSIS — G894 Chronic pain syndrome: Secondary | ICD-10-CM | POA: Diagnosis not present

## 2021-10-16 DIAGNOSIS — M4125 Other idiopathic scoliosis, thoracolumbar region: Secondary | ICD-10-CM | POA: Diagnosis not present

## 2021-10-16 DIAGNOSIS — M7918 Myalgia, other site: Secondary | ICD-10-CM | POA: Diagnosis not present

## 2021-10-16 DIAGNOSIS — M47816 Spondylosis without myelopathy or radiculopathy, lumbar region: Secondary | ICD-10-CM | POA: Diagnosis not present

## 2021-10-16 NOTE — Telephone Encounter (Signed)
[employer] prior auth APPROVED ?PA# [id number] ? ? ?

## 2021-10-16 NOTE — Telephone Encounter (Addendum)
Pt ready for scheduling on or after 10/16/21 ? ?Out-of-pocket cost due at time of visit: $448 ? ?Out-of-pocket max: $2,623.60 of $3,900 met, . once met, coverage increases to 100% (plan renews 06/29/22) ? ?Primary: [employer]ARP Medicare Adv ?Evenity co-insurance: 20% (approximately $418) ?Admin fee co-insurance: $30 ? ?Secondary:  ?Evenity co-insurance:  ?Admin fee co-insurance:  ? ?Deductible:  ? ?Prior Auth:  ?PA# ?Valid:  ? ?** This summary of benefits is an estimation of the patient's out-of-pocket cost. Exact cost may vary based on individual plan coverage.  ? ?

## 2021-10-21 DIAGNOSIS — D839 Common variable immunodeficiency, unspecified: Secondary | ICD-10-CM | POA: Diagnosis not present

## 2021-10-24 DIAGNOSIS — H532 Diplopia: Secondary | ICD-10-CM | POA: Diagnosis not present

## 2021-10-27 ENCOUNTER — Other Ambulatory Visit: Payer: Self-pay | Admitting: Cardiovascular Disease

## 2021-10-28 DIAGNOSIS — D839 Common variable immunodeficiency, unspecified: Secondary | ICD-10-CM | POA: Diagnosis not present

## 2021-11-04 DIAGNOSIS — D839 Common variable immunodeficiency, unspecified: Secondary | ICD-10-CM | POA: Diagnosis not present

## 2021-11-11 DIAGNOSIS — D839 Common variable immunodeficiency, unspecified: Secondary | ICD-10-CM | POA: Diagnosis not present

## 2021-11-18 DIAGNOSIS — D839 Common variable immunodeficiency, unspecified: Secondary | ICD-10-CM | POA: Diagnosis not present

## 2021-11-25 DIAGNOSIS — D839 Common variable immunodeficiency, unspecified: Secondary | ICD-10-CM | POA: Diagnosis not present

## 2021-12-01 ENCOUNTER — Ambulatory Visit: Payer: Medicare Other | Admitting: Cardiovascular Disease

## 2021-12-01 DIAGNOSIS — M170 Bilateral primary osteoarthritis of knee: Secondary | ICD-10-CM | POA: Diagnosis not present

## 2021-12-02 ENCOUNTER — Ambulatory Visit (INDEPENDENT_AMBULATORY_CARE_PROVIDER_SITE_OTHER): Payer: Medicare Other

## 2021-12-02 DIAGNOSIS — D839 Common variable immunodeficiency, unspecified: Secondary | ICD-10-CM | POA: Diagnosis not present

## 2021-12-02 DIAGNOSIS — I495 Sick sinus syndrome: Secondary | ICD-10-CM

## 2021-12-02 LAB — CUP PACEART REMOTE DEVICE CHECK
Battery Remaining Longevity: 159 mo
Battery Voltage: 3.2 V
Brady Statistic AP VP Percent: 0.47 %
Brady Statistic AP VS Percent: 97.67 %
Brady Statistic AS VP Percent: 0 %
Brady Statistic AS VS Percent: 1.85 %
Brady Statistic RA Percent Paced: 98.52 %
Brady Statistic RV Percent Paced: 0.47 %
Date Time Interrogation Session: 20230605195019
Implantable Lead Implant Date: 20130319
Implantable Lead Implant Date: 20130319
Implantable Lead Location: 753859
Implantable Lead Location: 753860
Implantable Pulse Generator Implant Date: 20230306
Lead Channel Impedance Value: 304 Ohm
Lead Channel Impedance Value: 399 Ohm
Lead Channel Impedance Value: 399 Ohm
Lead Channel Impedance Value: 456 Ohm
Lead Channel Pacing Threshold Amplitude: 0.75 V
Lead Channel Pacing Threshold Amplitude: 0.875 V
Lead Channel Pacing Threshold Pulse Width: 0.4 ms
Lead Channel Pacing Threshold Pulse Width: 0.4 ms
Lead Channel Sensing Intrinsic Amplitude: 2.125 mV
Lead Channel Sensing Intrinsic Amplitude: 2.125 mV
Lead Channel Sensing Intrinsic Amplitude: 20.875 mV
Lead Channel Sensing Intrinsic Amplitude: 20.875 mV
Lead Channel Setting Pacing Amplitude: 1.5 V
Lead Channel Setting Pacing Amplitude: 2 V
Lead Channel Setting Pacing Pulse Width: 0.4 ms
Lead Channel Setting Sensing Sensitivity: 0.9 mV

## 2021-12-03 ENCOUNTER — Other Ambulatory Visit: Payer: Self-pay | Admitting: Internal Medicine

## 2021-12-03 DIAGNOSIS — E039 Hypothyroidism, unspecified: Secondary | ICD-10-CM

## 2021-12-06 NOTE — Telephone Encounter (Signed)
Pt archived in [email or web address].  Please advise if patient and/or provider wish to proceed with EVENITY therpay.  

## 2021-12-08 DIAGNOSIS — M170 Bilateral primary osteoarthritis of knee: Secondary | ICD-10-CM | POA: Diagnosis not present

## 2021-12-09 DIAGNOSIS — D839 Common variable immunodeficiency, unspecified: Secondary | ICD-10-CM | POA: Diagnosis not present

## 2021-12-11 ENCOUNTER — Other Ambulatory Visit: Payer: Self-pay | Admitting: Cardiovascular Disease

## 2021-12-15 DIAGNOSIS — M170 Bilateral primary osteoarthritis of knee: Secondary | ICD-10-CM | POA: Diagnosis not present

## 2021-12-16 DIAGNOSIS — D839 Common variable immunodeficiency, unspecified: Secondary | ICD-10-CM | POA: Diagnosis not present

## 2021-12-16 NOTE — Progress Notes (Signed)
Remote pacemaker transmission.   

## 2021-12-22 ENCOUNTER — Encounter: Payer: Medicare Other | Admitting: Cardiovascular Disease

## 2021-12-23 DIAGNOSIS — D839 Common variable immunodeficiency, unspecified: Secondary | ICD-10-CM | POA: Diagnosis not present

## 2021-12-30 DIAGNOSIS — D839 Common variable immunodeficiency, unspecified: Secondary | ICD-10-CM | POA: Diagnosis not present

## 2022-01-06 DIAGNOSIS — D839 Common variable immunodeficiency, unspecified: Secondary | ICD-10-CM | POA: Diagnosis not present

## 2022-01-07 ENCOUNTER — Telehealth: Payer: Self-pay | Admitting: Cardiovascular Disease

## 2022-01-07 DIAGNOSIS — G894 Chronic pain syndrome: Secondary | ICD-10-CM | POA: Diagnosis not present

## 2022-01-07 DIAGNOSIS — M545 Low back pain, unspecified: Secondary | ICD-10-CM | POA: Diagnosis not present

## 2022-01-07 DIAGNOSIS — M47816 Spondylosis without myelopathy or radiculopathy, lumbar region: Secondary | ICD-10-CM | POA: Diagnosis not present

## 2022-01-07 NOTE — Telephone Encounter (Signed)
Pt c/o swelling: STAT is pt has developed SOB within 24 hours  If swelling, where is the swelling located? feet  How much weight have you gained and in what time span? Not sure  Have you gained 3 pounds in a day or 5 pounds in a week? Not sure  Do you have a log of your daily weights (if so, list)? no  Are you currently taking a fluid pill? Not sure  Are you currently SOB? no  Have you traveled recently? no   Patient states she just doesn't feel right and has swelling in her feet. She says she has not had this before.

## 2022-01-07 NOTE — Telephone Encounter (Signed)
Left message for patient to return the call.

## 2022-01-08 NOTE — Telephone Encounter (Signed)
Patient returned call

## 2022-01-08 NOTE — Telephone Encounter (Signed)
-  Pt called reporting bilateral feet and ankle swelling x 2 days -Pt denies significant weight increase or change in sodium intake, but report some SOB with exertion  -Pt report symptoms resolve with elevation  Will forward to MD for recommendations

## 2022-01-08 NOTE — Telephone Encounter (Signed)
Has weight increased (was 137.5 lb at last office visit here)? I do not think she has any diuretics at home. We can call in a few tablets (#15) of furosemide 20 mg once daily to take as needed until edema is gone and weight under 138 lb.

## 2022-01-09 MED ORDER — FUROSEMIDE 20 MG PO TABS: 20.0000 mg | ORAL_TABLET | Freq: Every day | ORAL | 0 refills | Status: DC | PRN

## 2022-01-09 NOTE — Telephone Encounter (Signed)
Pt updated and verbalized understanding Medication list updated

## 2022-01-13 DIAGNOSIS — D839 Common variable immunodeficiency, unspecified: Secondary | ICD-10-CM | POA: Diagnosis not present

## 2022-01-19 ENCOUNTER — Encounter: Payer: Self-pay | Admitting: Cardiovascular Disease

## 2022-01-19 ENCOUNTER — Ambulatory Visit (INDEPENDENT_AMBULATORY_CARE_PROVIDER_SITE_OTHER): Payer: Medicare Other | Admitting: Cardiovascular Disease

## 2022-01-19 VITALS — BP 73/55 | HR 78 | Ht 63.0 in | Wt 138.0 lb

## 2022-01-19 DIAGNOSIS — I5032 Chronic diastolic (congestive) heart failure: Secondary | ICD-10-CM | POA: Diagnosis not present

## 2022-01-19 DIAGNOSIS — I10 Essential (primary) hypertension: Secondary | ICD-10-CM

## 2022-01-19 DIAGNOSIS — I495 Sick sinus syndrome: Secondary | ICD-10-CM | POA: Diagnosis not present

## 2022-01-19 DIAGNOSIS — Z950 Presence of cardiac pacemaker: Secondary | ICD-10-CM | POA: Diagnosis not present

## 2022-01-19 DIAGNOSIS — Z8679 Personal history of other diseases of the circulatory system: Secondary | ICD-10-CM

## 2022-01-19 DIAGNOSIS — I471 Supraventricular tachycardia: Secondary | ICD-10-CM

## 2022-01-19 MED ORDER — LISINOPRIL 20 MG PO TABS: 20.0000 mg | ORAL_TABLET | Freq: Every day | ORAL | 1 refills | Status: DC

## 2022-01-19 MED ORDER — FUROSEMIDE 20 MG PO TABS: 20.0000 mg | ORAL_TABLET | Freq: Every day | ORAL | 1 refills | Status: DC | PRN

## 2022-01-19 NOTE — Progress Notes (Unsigned)
Cardiology Office Note    Date:  01/20/2022   ID:  [name], [name] [birth year], MRN [id number]  PCP:  Janith Lima, MD  Cardiologist:   Sanda Klein, MD   Chief Complaint  Patient presents with   Fatigue    History of Present Illness:  [name] is a 87 y.o. female with sinus node dysfunction and implantation of a dual-chamber permanent pacemaker (Medtronic Revo 2013, generator change 09/01/2021), non-Hodgkin's lymphoma in remission.    She complains of fatigue and feeling sleepy all the time.  No outright dizziness and denies syncope.  Her blood pressure has been low.  Over the years she has had slow oscillation in her blood pressure that has required back-and-forth adjustments in her antihypertensive medications.  A couple of years ago we had to gradually reduce and lisinopril and stop diuretics due to orthostatic hypotension.  Her blood pressure then increased to the 150s/100s and we had to restart these medications, gradually titrate upwards to lisinopril 40 mg as well as add amlodipine.  She is now hypotensive with a blood pressure of 73/55 on initial evaluation, although she is alert and walked in without difficulty.  She has not taken any of her "as needed" furosemide recently.  She does not have edema.  She denies orthopnea, PND or exertional dyspnea.  She has not had palpitations or focal neurological events.  She is not experiencing chest pain at rest with activity.  Her weight is unchanged.  She continues to receive Hizentra subcutaneous injections.  Pacemaker function is normal.  She has 99% atrial pacing with good heart rate histogram distribution and only 0.4% ventricular pacing.  Activity level is constant at about 1-1/2 hours a day.  There have been no episodes of atrial fibrillation and no high ventricular rate episodes.  Estimated generator longevity is over 13 years.  Shortly after pacemaker implantation she had problems with paroxysmal atrial  fibrillation likely related to microperforation and acute pericarditis.  For many years or after she has not had recurrent atrial fibrillation. She did have some shortness of breath in 2018 that responded promptly to diuretics. September 2019, she had a single syncopal events shortly after arriving in [place] after a long airline trip. Echo showed normal left ventricular systolic function but evidence of diastolic dysfunction and increased filling pressure.  She developed problems with hypervolemia while receiving IVIG (Hizentra) infusions, improved when this was switched to the low volume subcutaneous injections.   Past Medical History:  Diagnosis Date   Anemia    pt denies   Arthritis    Atrial fibrillation (HCC)    Cataract    bil   Common variable immunodeficiency (HCC)    Cystocele    history with rectocele, pt denies   GERD (gastroesophageal reflux disease)    H/O hiatal hernia    Hypertension    Hypothyroidism    Non Hodgkin's lymphoma ([place])    dx 2012 and now in remission   Osteoporosis    Pacemaker 09/15/2011   Medtronic Revo   Pericarditis    Pleural effusion, bilateral    history of   Pulmonary nodules    history of   Scoliosis    Varicose vein     Past Surgical History:  Procedure Laterality Date   BREAST SURGERY  07/30/01   biopsy x 3, bilateral   CATARACT EXTRACTION Bilateral    COLONOSCOPY     ESOPHAGOGASTRODUODENOSCOPY (EGD) WITH PROPOFOL N/A 12/10/2020   Procedure: ESOPHAGOGASTRODUODENOSCOPY (EGD) WITH  PROPOFOL;  Surgeon: Ronnette Juniper, MD;  Location: [facility];  Service: Gastroenterology;  Laterality: N/A;  NG Tube Insertion   MASS EXCISION Right 10/28/2012   Procedure: Removal of mass on neck       ;  Surgeon: Adin Hector, MD;  Location: [facility];  Service: General;  Laterality: Right;   PARTIAL HYSTERECTOMY  04/29/2004   PERMANENT PACEMAKER INSERTION N/A 09/15/2011   Procedure: PERMANENT PACEMAKER INSERTION;  Surgeon: Sanda Klein, MD;  Location: [facility] CATH  [facility];  Service: Cardiovascular;  Laterality: N/A;   PPM GENERATOR CHANGEOUT N/A 09/01/2021   Procedure: PPM GENERATOR CHANGEOUT;  Surgeon: Sanda Klein, MD;  Location: [facility];  Service: Cardiovascular;  Laterality: N/A;   RADIOACTIVE SEED GUIDED EXCISIONAL BREAST BIOPSY Left 08/11/2017   Procedure: LEFT RADIOACTIVE SEED GUIDED EXCISIONAL BREAST BIOPSY ERAS PATHWAY;  Surgeon: Stark Klein, MD;  Location: [facility];  Service: General;  Laterality: Left;   TONSILLECTOMY      Current Medications: Outpatient Medications Prior to Visit  Medication Sig Dispense Refill   acetaminophen (TYLENOL) 500 MG tablet Take 500 mg by mouth every 6 (six) hours as needed (for pain.).      Calcium Carbonate-Vitamin D 600-400 MG-UNIT tablet Take 1 tablet by mouth daily.     dexlansoprazole (DEXILANT) 60 MG capsule Take 60 mg by mouth daily.     EPINEPHrine 0.3 mg/0.3 mL IJ SOAJ injection Inject 0.3 mg into the muscle as needed for anaphylaxis.     ferrous fumarate (HEMOCYTE - 106 MG FE) 325 (106 Fe) MG TABS tablet Take 1 tablet by mouth daily.     gabapentin (NEURONTIN) 300 MG capsule TAKE 1 CAPSULE(300 MG) BY MOUTH FOUR TIMES DAILY 360 capsule 1   Immune Globulin, Human, (HIZENTRA) 4 GM/20ML SOLN Inject 8 g into the skin once a week. Thursday's     levothyroxine (SYNTHROID) 100 MCG tablet TAKE 1 TABLET(100 MCG) BY MOUTH DAILY 90 tablet 0   Magnesium Oxide (MAG-OX 400 PO) Take 400 mg by mouth 2 (two) times a week.     metoprolol tartrate (LOPRESSOR) 50 MG tablet Take 1 tablet (50 mg total) by mouth 2 (two) times daily. 180 tablet 3   omeprazole (PRILOSEC) 40 MG capsule TAKE 1 CAPSULE(40 MG) BY MOUTH DAILY (Patient taking differently: Take 40 mg by mouth daily.) 90 capsule 0   potassium chloride (KLOR-CON) 10 MEQ tablet TAKE 1 TABLET(10 MEQ) BY MOUTH DAILY (Patient taking differently: Take 10 mEq by mouth daily.) 90 tablet 0   thiamine 100 MG tablet Take 1 tablet (100 mg total) by mouth daily. 90 tablet 1    amLODipine (NORVASC) 5 MG tablet TAKE 1 TABLET(5 MG) BY MOUTH DAILY 90 tablet 1   furosemide (LASIX) 20 MG tablet Take 1 tablet (20 mg total) by mouth daily as needed (swelling). 15 tablet 0   lisinopril (ZESTRIL) 40 MG tablet TAKE 1 TABLET(40 MG) BY MOUTH DAILY 30 tablet 10   atorvastatin (LIPITOR) 40 MG tablet Take 40 mg by mouth daily in the afternoon. (Patient not taking: Reported on 01/19/2022)     No facility-administered medications prior to visit.     Allergies:   Amoxicillin, Buprenorphine, Codeine, Demerol, Dextromethorphan-guaifenesin, Morphine and related, Sulfonamide derivatives, and Vicodin [hydrocodone-acetaminophen]   Social History   Socioeconomic History   Marital status: Widowed    Spouse name: Not on file   Number of children: 3   Years of education: Not on file   Highest education level: Not on file  Occupational History   Occupation: [employer]  Tobacco Use   Smoking status: Never   Smokeless tobacco: Never  Vaping Use   Vaping Use: Never used  Substance and Sexual Activity   Alcohol use: Yes    Comment: 1 glass of wine occasionally   Drug use: No   Sexual activity: Not on file  Other Topics Concern   Not on file  Social History Narrative   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  She lives alone in a 3-story home.  There is a lift chair which she does not use (placed for her husband).      Right handed    Social Determinants of Health   Financial Resource Strain: Not on file  Food Insecurity: Not on file  Transportation Needs: Not on file  Physical Activity: Not on file  Stress: Not on file  Social Connections: Not on file     Family History:  The patient's family history includes Breast cancer in her sister; Heart disease in her brother and daughter; Lymphoma in her daughter; Stroke in her father and mother.   ROS:   Please see the history of present illness.    ROS All other  systems reviewed and are negative.   PHYSICAL EXAM:   VS:  BP (!) 73/55 (BP Location: Left Arm, Patient Position: Sitting, Cuff Size: Normal)   Pulse 78   Ht '5\' 3"'$  (1.6 m)   Wt 138 lb (62.6 kg)   SpO2 92%   BMI 24.45 kg/m      General: Alert, oriented x3, no distress, healthy pacemaker site without redness or swelling. Head: no evidence of trauma, PERRL, EOMI, no exophtalmos or lid lag, no myxedema, no xanthelasma; normal ears, nose and oropharynx Neck: normal jugular venous pulsations and no hepatojugular reflux; brisk carotid pulses without delay and no carotid bruits Chest: clear to auscultation, no signs of consolidation by percussion or palpation, normal fremitus, symmetrical and full respiratory excursions Cardiovascular: normal position and quality of the apical impulse, regular rhythm, normal first and second heart sounds, no murmurs, rubs or gallops Abdomen: no tenderness or distention, no masses by palpation, no abnormal pulsatility or arterial bruits, normal bowel sounds, no hepatosplenomegaly Extremities: no clubbing, cyanosis or edema; 2+ radial, ulnar and brachial pulses bilaterally; 2+ right femoral, posterior tibial and dorsalis pedis pulses; 2+ left femoral, posterior tibial and dorsalis pedis pulses; no subclavian or femoral bruits Neurological: grossly nonfocal Psych: Normal mood and affect    Wt Readings from Last 3 Encounters:  01/19/22 138 lb (62.6 kg)  10/08/21 137 lb 6.4 oz (62.3 kg)  09/01/21 135 lb (61.2 kg)      Studies/Labs Reviewed:   EKG:  EKG is ordered today shows atrial paced, ventricular sensed rhythm with borderline left axis deviation but otherwise normal tracing.  QTc 451 ms.  recent Labs: 06/03/2021: Magnesium 2.2; TSH 3.37 08/06/2021: ALT 26; BUN 14; Creatinine 0.75; Hemoglobin 13.9; Platelet Count 217; Potassium 4.1; Sodium 142  Lipid Panel     Component Value Date/Time   CHOL 182 06/03/2021 1054   CHOL 144 11/17/2018 1129   TRIG 191.0  (H) 06/03/2021 1054   HDL 47.40 06/03/2021 1054   HDL 41 11/17/2018 1129   CHOLHDL 4 06/03/2021 1054   VLDL 38.2 06/03/2021 1054   LDLCALC 96 06/03/2021 1054   LDLCALC 74 11/17/2018 1129   LABVLDL 29 11/17/2018 1129     ASSESSMENT:    1. Chronic diastolic heart  failure ([place])   2. Essential hypertension   3. SSS (sick sinus syndrome) ([place])   4. Pacemaker   5. Paroxysmal atrial tachycardia ([place])   6. History of pericarditis      PLAN:  In order of problems listed above:  CHF: Currently appears euvolemic and has no symptoms or shortness of breath, no evidence of edema.  Echo in 2022 showed findings of pseudo normal mitral inflow pattern.  Continue to avoid NSAIDs and sodium rich foods.  She does not require daily diuretic dosing and I would avoid this due to her history of orthostatic hypotension. HTN: Once again she has unexplained fluctuations in her blood pressure that happen every couple of years or so.  She does not appear to be dehydrated.  We will cut back the lisinopril and stop amlodipine.  Currently does not have lower extremity edema so we will have her avoid taking furosemide.   SSS: 100% atrial paced.  Good heart rate histogram based on current sensor settings. PM: Site healed well after recent generator change, continue remote downloads every 3 months. PAT/PAF: No recent arrhythmia.  Outside of the initial period of post pacemaker implantation pericarditis when she had atrial fibrillation she has had extremely brief bursts of atrial fib, maximum 90 seconds.  She has not had any atrial fibrillation since this new generator was implanted.  Anticoagulation does not appear to be indicated. History of post procedural acute pericarditis after pacemaker implantation, resolved. Had atrial fibrillation related to acute pericarditis, which has not recurred in significant amounts over the ensuing years. No evidence of constrictive physiology on echo in 2018 or 2022. She has not received  chest radiation therapy for lymphoma, just chemotherapy.  Consider repeat echo or even cardiac MRI if signs of heart failure worsen, to look for signs of constriction.   Medication Adjustments/Labs and Tests Ordered: Current medicines are reviewed at length with the patient today.  Concerns regarding medicines are outlined above.  Medication changes, Labs and Tests ordered today are listed in the Patient Instructions below. Patient Instructions  Medication Instructions:  STOP the Amlodipine DECREASE the Lisinopril to 20 mg once daily TAKE Furosemide 20 mg once daily as needed for a weight over 140 lbs.  *If you need a refill on your cardiac medications before your next appointment, please call your pharmacy*   Lab Work: None ordered If you have labs (blood work) drawn today and your tests are completely normal, you will receive your results only by: [name] (if you have MyChart) OR A paper copy in the mail If you have any lab test that is abnormal or we need to change your treatment, we will call you to review the results.   Testing/Procedures: None ordered   Follow-Up: At [facility], you and your health needs are our priority.  As part of our continuing mission to provide you with exceptional heart care, we have created designated Provider Care Teams.  These Care Teams include your primary Cardiologist (physician) and Advanced Practice Providers (APPs -  Physician Assistants and Nurse Practitioners) who all work together to provide you with the care you need, when you need it.  We recommend signing up for the patient portal called "MyChart".  Sign up information is provided on this After Visit Summary.  MyChart is used to connect with patients for Virtual Visits (Telemedicine).  Patients are able to view lab/test results, encounter notes, upcoming appointments, etc.  Non-urgent messages can be sent to your provider as well.  To learn more about what you can do with MyChart,  go to [email or web address].    Your next appointment:   3 month(s) on a device day  The format for your next appointment:   In Person  Provider:   Sanda Klein, MD {    Important Information About Sugar         Signed, Sanda Klein, MD  01/20/2022 9:00 AM    [facility] [place], [place], [place]  [address] Phone: [phone]; Fax: [phone]

## 2022-01-19 NOTE — Patient Instructions (Signed)
Medication Instructions:  STOP the Amlodipine DECREASE the Lisinopril to 20 mg once daily TAKE Furosemide 20 mg once daily as needed for a weight over 140 lbs.  *If you need a refill on your cardiac medications before your next appointment, please call your pharmacy*   Lab Work: None ordered If you have labs (blood work) drawn today and your tests are completely normal, you will receive your results only by: [place] (if you have MyChart) OR A paper copy in the mail If you have any lab test that is abnormal or we need to change your treatment, we will call you to review the results.   Testing/Procedures: None ordered   Follow-Up: At [facility], you and your health needs are our priority.  As part of our continuing mission to provide you with exceptional heart care, we have created designated Provider Care Teams.  These Care Teams include your primary Cardiologist (physician) and Advanced Practice Providers (APPs -  Physician Assistants and Nurse Practitioners) who all work together to provide you with the care you need, when you need it.  We recommend signing up for the patient portal called "MyChart".  Sign up information is provided on this After Visit Summary.  MyChart is used to connect with patients for Virtual Visits (Telemedicine).  Patients are able to view lab/test results, encounter notes, upcoming appointments, etc.  Non-urgent messages can be sent to your provider as well.   To learn more about what you can do with MyChart, go to [email or web address].    Your next appointment:   3 month(s) on a device day  The format for your next appointment:   In Person  Provider:   Sanda Klein, MD {    Important Information About Sugar

## 2022-01-20 ENCOUNTER — Encounter: Payer: Self-pay | Admitting: Cardiovascular Disease

## 2022-01-20 DIAGNOSIS — D839 Common variable immunodeficiency, unspecified: Secondary | ICD-10-CM | POA: Diagnosis not present

## 2022-01-20 DIAGNOSIS — M5116 Intervertebral disc disorders with radiculopathy, lumbar region: Secondary | ICD-10-CM | POA: Diagnosis not present

## 2022-01-27 DIAGNOSIS — D839 Common variable immunodeficiency, unspecified: Secondary | ICD-10-CM | POA: Diagnosis not present

## 2022-02-03 DIAGNOSIS — D839 Common variable immunodeficiency, unspecified: Secondary | ICD-10-CM | POA: Diagnosis not present

## 2022-02-09 ENCOUNTER — Telehealth: Payer: Self-pay | Admitting: Cardiovascular Disease

## 2022-02-09 MED ORDER — AMLODIPINE BESYLATE 5 MG PO TABS: ORAL_TABLET | ORAL | 2 refills | Status: DC

## 2022-02-09 NOTE — Telephone Encounter (Signed)
Please give her a prescription for amlodipine 5 mg daily, but have her start by taking half a tablet once a day.  If her blood pressure remains elevated (more than 140/90) after a week, please then increase to a full 5 mg tablet daily.

## 2022-02-09 NOTE — Telephone Encounter (Signed)
Patient c/o of elevated BP for the past few days. She has increase SOB with activity and has increase fatigue after activity. She does not go outside unless she has to and it is minimal due to it being so hot. There has not been any swelling, chest pain, or blurred vision. She stated she took the Lasix 20 mg over the weekend but there was not change. Her current weight is 138lb. She did c/o of a headache yesterday but it was resolved with some Tylenol.  Patient stated that this all has been going on since her 7/24 medication change of stopping the Amlodipine. She has not tried to take it again to see if it helps as she does not have any more to take, but wants to know if that is something she should try? I informed her we would send message to Dr. Loletha Grayer to evaluate, he is working in the hospital this week and we will get back with her by end of day 8/15.

## 2022-02-09 NOTE — Telephone Encounter (Signed)
Pt c/o BP issue: STAT if pt c/o blurred vision, one-sided weakness or slurred speech  1. What are your last 5 BP readings?  02/09/22 AM 151/102 02/08/22 AM 182/111 02/07/22 AM 155/105 02/06/22 AM 170/99 02/05/22 AM 171/101 2. Are you having any other symptoms (ex. Dizziness, headache, blurred vision, passed out)? Fatigue and short winded   3. What is your BP issue? High BP

## 2022-02-09 NOTE — Telephone Encounter (Signed)
Patient made aware of Dr. Loletha Grayer medication recommendations. Agreed with plan. Reviewed pharmacy and medication sent with instructions to preferred pharmacy. No additional questions at this time.

## 2022-02-10 DIAGNOSIS — D839 Common variable immunodeficiency, unspecified: Secondary | ICD-10-CM | POA: Diagnosis not present

## 2022-02-11 ENCOUNTER — Other Ambulatory Visit: Payer: Self-pay

## 2022-02-11 DIAGNOSIS — M47816 Spondylosis without myelopathy or radiculopathy, lumbar region: Secondary | ICD-10-CM | POA: Diagnosis not present

## 2022-02-11 DIAGNOSIS — M7918 Myalgia, other site: Secondary | ICD-10-CM | POA: Diagnosis not present

## 2022-02-11 NOTE — Patient Outreach (Signed)
  Care Coordination   02/11/2022 Name: [name] MRN: [id number] DOB: 21-Nov-1934   Care Coordination Outreach Attempts:  Contact was made with the patient today to offer care coordination services as a benefit of their health plan. The patient requested a return call on a later date.   Follow Up Plan:  Additional outreach attempts will be made to offer the patient care coordination information and services.   Encounter Outcome:  Pt. Request to Call Back  Care Coordination Interventions Activated:  No   Care Coordination Interventions:  No, not indicated    Thea Silversmith, RN, MSN, BSN, CCM Care Coordinator [phone]

## 2022-02-12 ENCOUNTER — Other Ambulatory Visit: Payer: Self-pay

## 2022-02-12 NOTE — Patient Outreach (Signed)
  Care Coordination   02/12/2022 Name: [name] MRN: [id number] DOB: 1934-07-08   Care Coordination Outreach Attempts:  A second unsuccessful outreach was attempted today to offer the patient with information about available care coordination services as a benefit of their health plan.     Follow Up Plan:  Additional outreach attempts will be made to offer the patient care coordination information and services.   Encounter Outcome:  No Answer  Care Coordination Interventions Activated:  No   Care Coordination Interventions:  No, not indicated    Thea Silversmith, RN, MSN, BSN, CCM Care Coordinator [phone]

## 2022-02-16 DIAGNOSIS — M1712 Unilateral primary osteoarthritis, left knee: Secondary | ICD-10-CM | POA: Diagnosis not present

## 2022-02-17 DIAGNOSIS — D839 Common variable immunodeficiency, unspecified: Secondary | ICD-10-CM | POA: Diagnosis not present

## 2022-02-23 ENCOUNTER — Ambulatory Visit: Payer: Self-pay

## 2022-02-23 NOTE — Patient Outreach (Signed)
  Care Coordination   02/23/2022 Name: [name] MRN: [id number] DOB: [birth year]   Care Coordination Outreach Attempts:  A third unsuccessful outreach was attempted today to offer the patient with information about available care coordination services as a benefit of their health plan.   Follow Up Plan:  No further outreach attempts will be made at this time. We have been unable to contact the patient to offer or enroll patient in care coordination services  Encounter Outcome:  No Answer  Care Coordination Interventions Activated:  No   Care Coordination Interventions:  No, not indicated    Thea Silversmith, RN, MSN, BSN, CCM Care Coordinator [phone]

## 2022-02-24 DIAGNOSIS — D839 Common variable immunodeficiency, unspecified: Secondary | ICD-10-CM | POA: Diagnosis not present

## 2022-02-27 ENCOUNTER — Other Ambulatory Visit: Payer: Self-pay | Admitting: Internal Medicine

## 2022-02-27 DIAGNOSIS — G6289 Other specified polyneuropathies: Secondary | ICD-10-CM

## 2022-03-03 ENCOUNTER — Ambulatory Visit (INDEPENDENT_AMBULATORY_CARE_PROVIDER_SITE_OTHER): Payer: Medicare Other

## 2022-03-03 ENCOUNTER — Other Ambulatory Visit: Payer: Self-pay | Admitting: Internal Medicine

## 2022-03-03 DIAGNOSIS — M47816 Spondylosis without myelopathy or radiculopathy, lumbar region: Secondary | ICD-10-CM | POA: Diagnosis not present

## 2022-03-03 DIAGNOSIS — E039 Hypothyroidism, unspecified: Secondary | ICD-10-CM

## 2022-03-03 DIAGNOSIS — D839 Common variable immunodeficiency, unspecified: Secondary | ICD-10-CM | POA: Diagnosis not present

## 2022-03-03 DIAGNOSIS — I5032 Chronic diastolic (congestive) heart failure: Secondary | ICD-10-CM | POA: Diagnosis not present

## 2022-03-06 LAB — CUP PACEART REMOTE DEVICE CHECK
Battery Remaining Longevity: 156 mo
Battery Voltage: 3.16 V
Brady Statistic AP VP Percent: 0.33 %
Brady Statistic AP VS Percent: 96.9 %
Brady Statistic AS VP Percent: 0 %
Brady Statistic AS VS Percent: 2.77 %
Brady Statistic RA Percent Paced: 97.51 %
Brady Statistic RV Percent Paced: 0.33 %
Date Time Interrogation Session: 20230905033251
Implantable Lead Implant Date: 20130319
Implantable Lead Implant Date: 20130319
Implantable Lead Location: 753859
Implantable Lead Location: 753860
Implantable Pulse Generator Implant Date: 20230306
Lead Channel Impedance Value: 323 Ohm
Lead Channel Impedance Value: 418 Ohm
Lead Channel Impedance Value: 437 Ohm
Lead Channel Impedance Value: 475 Ohm
Lead Channel Pacing Threshold Amplitude: 0.5 V
Lead Channel Pacing Threshold Amplitude: 0.875 V
Lead Channel Pacing Threshold Pulse Width: 0.4 ms
Lead Channel Pacing Threshold Pulse Width: 0.4 ms
Lead Channel Sensing Intrinsic Amplitude: 1.25 mV
Lead Channel Sensing Intrinsic Amplitude: 1.25 mV
Lead Channel Sensing Intrinsic Amplitude: 18.375 mV
Lead Channel Sensing Intrinsic Amplitude: 18.375 mV
Lead Channel Setting Pacing Amplitude: 1.5 V
Lead Channel Setting Pacing Amplitude: 2 V
Lead Channel Setting Pacing Pulse Width: 0.4 ms
Lead Channel Setting Sensing Sensitivity: 0.9 mV

## 2022-03-12 DIAGNOSIS — D839 Common variable immunodeficiency, unspecified: Secondary | ICD-10-CM | POA: Diagnosis not present

## 2022-03-15 ENCOUNTER — Other Ambulatory Visit: Payer: Self-pay | Admitting: Cardiovascular Disease

## 2022-03-19 ENCOUNTER — Other Ambulatory Visit: Payer: Self-pay

## 2022-03-19 DIAGNOSIS — D839 Common variable immunodeficiency, unspecified: Secondary | ICD-10-CM | POA: Diagnosis not present

## 2022-03-19 MED ORDER — FUROSEMIDE 20 MG PO TABS: ORAL_TABLET | ORAL | 1 refills | Status: DC

## 2022-03-23 NOTE — Progress Notes (Signed)
Remote pacemaker transmission.   

## 2022-03-26 DIAGNOSIS — D839 Common variable immunodeficiency, unspecified: Secondary | ICD-10-CM | POA: Diagnosis not present

## 2022-03-30 ENCOUNTER — Ambulatory Visit: Payer: Medicare Other | Attending: Cardiovascular Disease | Admitting: Cardiovascular Disease

## 2022-03-30 ENCOUNTER — Encounter: Payer: Self-pay | Admitting: Cardiovascular Disease

## 2022-03-30 VITALS — BP 112/67 | HR 83 | Ht 63.0 in | Wt 140.0 lb

## 2022-03-30 DIAGNOSIS — I4719 Other supraventricular tachycardia: Secondary | ICD-10-CM | POA: Diagnosis not present

## 2022-03-30 DIAGNOSIS — Z8679 Personal history of other diseases of the circulatory system: Secondary | ICD-10-CM | POA: Diagnosis not present

## 2022-03-30 DIAGNOSIS — I10 Essential (primary) hypertension: Secondary | ICD-10-CM

## 2022-03-30 DIAGNOSIS — Z950 Presence of cardiac pacemaker: Secondary | ICD-10-CM

## 2022-03-30 DIAGNOSIS — I495 Sick sinus syndrome: Secondary | ICD-10-CM | POA: Diagnosis not present

## 2022-03-30 DIAGNOSIS — I5033 Acute on chronic diastolic (congestive) heart failure: Secondary | ICD-10-CM | POA: Diagnosis not present

## 2022-03-30 MED ORDER — EMPAGLIFLOZIN 10 MG PO TABS: 10.0000 mg | ORAL_TABLET | Freq: Every day | ORAL | 1 refills | Status: DC

## 2022-03-30 NOTE — Progress Notes (Addendum)
Cardiology Office Note    Date:  03/30/2022   ID:  Erica, Foster [birth year], MRN [id number]  PCP:  Janith Lima, MD  Cardiologist:   Sanda Klein, MD   Chief Complaint  Patient presents with   Shortness of Breath    History of Present Illness:  [name] is a 87 y.o. female with sinus node dysfunction and implantation of a dual-chamber permanent pacemaker (Medtronic Revo 2013, generator change 09/01/2021), non-Hodgkin's lymphoma in remission.    At her last appointment she was going through one of her periodic  hypotension spells (etiology unclear) and we had to stop all her medications.  Her blood pressure gradually returned back to normal and we have restarted lisinopril 20 mg daily and amlodipine 2.5 mg daily, half of her previous doses.  Her blood pressure is now normal range and she does not have any dizziness or fatigue or sleepiness as she complained of before.  She weighed 138 pounds at that previous visit.  She developed lower extremity edema and started to take her "as needed"  furosemide 20 mg daily for weights over 140 pounds.  Her edema has resolved but she continues to have shortness of breath, NYHA functional class II.  She was on a recent trip to [facility] and had to be pushed around in a wheelchair to keep up with everybody else.  Today she was 140 pounds and does not have any visible lower extremity edema, although she does have some jugular venous distention.  Last echocardiogram in June 2022 showed normal left ventricular systolic function but does show evidence of significant diastolic dysfunction with pseudonormal mitral inflow and elevated left heart filling pressures.  She continues to self administer Hizentra subcutaneous injections weekly.  Pacemaker function is normal and have been no meaningful episodes of arrhythmia.  Battery longevity is estimated at almost 13 years (she underwent generator change out in March 2023).  She has not had any  atrial fibrillation.  She had 1 episode of paroxysmal atrial tachycardia that only lasted for 3 seconds.  She has 93% atrial pacing and 0.6% ventricular pacing.  There have been no episodes of ventricular tachycardia.  Presenting rhythm is atrial paced, ventricular sensed with a slightly prolonged AV delay at 218 ms.  Shortly after initial pacemaker implantation in 2013, she had problems with paroxysmal atrial fibrillation likely related to microperforation and acute pericarditis.  For many years after she has not had recurrent atrial fibrillation. She did have some shortness of breath in 2018 that responded promptly to diuretics. September 2019, she had a single syncopal events shortly after arriving in [place] after a long airline trip. Echo showed normal left ventricular systolic function but evidence of diastolic dysfunction and increased filling pressure.  She developed problems with hypervolemia while receiving IVIG (Hizentra) infusions, improved when this was switched to the low volume subcutaneous injections.  Periodically she has episodes of hypotension where we have to discontinue all of her antihypertensive medications, but after a few weeks gradually returns to have markedly elevated blood pressure and we restarted her medications.   Past Medical History:  Diagnosis Date   Anemia    pt denies   Arthritis    Atrial fibrillation (HCC)    Cataract    bil   Common variable immunodeficiency (HCC)    Cystocele    history with rectocele, pt denies   GERD (gastroesophageal reflux disease)    H/O hiatal hernia    Hypertension  Hypothyroidism    Non Hodgkin's lymphoma ([place])    dx 2012 and now in remission   Osteoporosis    Pacemaker 09/15/2011   Medtronic Revo   Pericarditis    Pleural effusion, bilateral    history of   Pulmonary nodules    history of   Scoliosis    Varicose vein     Past Surgical History:  Procedure Laterality Date   BREAST SURGERY  07/30/01   biopsy x 3,  bilateral   CATARACT EXTRACTION Bilateral    COLONOSCOPY     ESOPHAGOGASTRODUODENOSCOPY (EGD) WITH PROPOFOL N/A 12/10/2020   Procedure: ESOPHAGOGASTRODUODENOSCOPY (EGD) WITH PROPOFOL;  Surgeon: Ronnette Juniper, MD;  Location: [place];  Service: Gastroenterology;  Laterality: N/A;  NG Tube Insertion   MASS EXCISION Right 10/28/2012   Procedure: Removal of mass on neck       ;  Surgeon: Adin Hector, MD;  Location: [place];  Service: General;  Laterality: Right;   PARTIAL HYSTERECTOMY  04/29/2004   PERMANENT PACEMAKER INSERTION N/A 09/15/2011   Procedure: PERMANENT PACEMAKER INSERTION;  Surgeon: Sanda Klein, MD;  Location: [place] [facility]ATH [facility];  Service: Cardiovascular;  Laterality: N/A;   PPM GENERATOR CHANGEOUT N/A 09/01/2021   Procedure: PPM GENERATOR CHANGEOUT;  Surgeon: Sanda Klein, MD;  Location: [place] [facility];  Service: Cardiovascular;  Laterality: N/A;   RADIOACTIVE SEED GUIDED EXCISIONAL BREAST BIOPSY Left 08/11/2017   Procedure: LEFT RADIOACTIVE SEED GUIDED EXCISIONAL BREAST BIOPSY ERAS PATHWAY;  Surgeon: Stark Klein, MD;  Location: [place];  Service: General;  Laterality: Left;   TONSILLECTOMY      Current Medications: Outpatient Medications Prior to Visit  Medication Sig Dispense Refill   acetaminophen (TYLENOL) 500 MG tablet Take 500 mg by mouth every 6 (six) hours as needed (for pain.).      amLODipine (NORVASC) 5 MG tablet Take HALF tablet by mouth daily. Increase to 1 tablet my mouth after a week if BP >140/90. 30 tablet 2   atorvastatin (LIPITOR) 40 MG tablet Take 40 mg by mouth daily in the afternoon.     Calcium Carbonate-Vitamin D 600-400 MG-UNIT tablet Take 1 tablet by mouth daily.     dexlansoprazole (DEXILANT) 60 MG capsule Take 60 mg by mouth daily.     ferrous fumarate (HEMOCYTE - 106 MG FE) 325 (106 Fe) MG TABS tablet Take 1 tablet by mouth daily.     furosemide (LASIX) 20 MG tablet TAKE 1 TABLET BY MOUTH EVERY DAY AS NEEDED FOR A WEIGHT OVER 140LBS 30 tablet 1    gabapentin (NEURONTIN) 300 MG capsule TAKE 1 CAPSULE(300 MG) BY MOUTH FOUR TIMES DAILY 360 capsule 1   Immune Globulin, Human, (HIZENTRA) 4 GM/20ML SOLN Inject 8 g into the skin once a week. Thursday's     levothyroxine (SYNTHROID) 100 MCG tablet TAKE 1 TABLET(100 MCG) BY MOUTH DAILY 90 tablet 0   lisinopril (ZESTRIL) 20 MG tablet Take 1 tablet (20 mg total) by mouth daily. (Patient taking differently: Take 20 mg by mouth 2 (two) times daily.) 90 tablet 1   Magnesium Oxide (MAG-OX 400 PO) Take 400 mg by mouth 2 (two) times a week.     metoprolol tartrate (LOPRESSOR) 50 MG tablet Take 1 tablet (50 mg total) by mouth 2 (two) times daily. 180 tablet 3   omeprazole (PRILOSEC) 40 MG capsule TAKE 1 CAPSULE(40 MG) BY MOUTH DAILY (Patient taking differently: Take 40 mg by mouth daily.) 90 capsule 0   potassium chloride (KLOR-CON) 10 MEQ tablet  TAKE 1 TABLET(10 MEQ) BY MOUTH DAILY (Patient taking differently: Take 10 mEq by mouth daily.) 90 tablet 0   thiamine 100 MG tablet Take 1 tablet (100 mg total) by mouth daily. 90 tablet 1   EPINEPHrine 0.3 mg/0.3 mL IJ SOAJ injection Inject 0.3 mg into the muscle as needed for anaphylaxis. (Patient not taking: Reported on 03/30/2022)     No facility-administered medications prior to visit.     Allergies:   Tramadol, Amoxicillin, Buprenorphine, Codeine, Demerol, Dextromethorphan-guaifenesin, Morphine and related, Sulfonamide derivatives, and Vicodin [hydrocodone-acetaminophen]   Social History   Socioeconomic History   Marital status: Widowed    Spouse name: Not on file   Number of children: 3   Years of education: Not on file   Highest education level: Not on file  Occupational History   Occupation: [employer]  Tobacco Use   Smoking status: Never   Smokeless tobacco: Never  Vaping Use   Vaping Use: Never used  Substance and Sexual Activity   Alcohol use: Yes    Comment: 1 glass of wine occasionally   Drug use: No   Sexual activity: Not on  file  Other Topics Concern   Not on file  Social History Narrative   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  She lives alone in a 3-story home.  There is a lift chair which she does not use (placed for her husband).      Right handed    Social Determinants of Health   Financial Resource Strain: Not on file  Food Insecurity: Not on file  Transportation Needs: Not on file  Physical Activity: Not on file  Stress: Not on file  Social Connections: Not on file     Family History:  The patient's family history includes Breast cancer in her sister; Heart disease in her brother and daughter; Lymphoma in her daughter; Stroke in her father and mother.   ROS:   Please see the history of present illness.    ROS All other systems reviewed and are negative.   PHYSICAL EXAM:   VS:  BP 112/67 (BP Location: Left Arm, Patient Position: Sitting, Cuff Size: Normal)   Pulse 83   Ht '5\' 3"'$  (1.6 m)   Wt 140 lb (63.5 kg)   SpO2 97%   BMI 24.80 kg/m      General: Alert, oriented x3, no distress, well-healed left subclavian pacemaker site Head: no evidence of trauma, PERRL, EOMI, no exophtalmos or lid lag, no myxedema, no xanthelasma; normal ears, nose and oropharynx Neck: 5-6 cm jugular venous pulsations and no hepatojugular reflux; brisk carotid pulses without delay and no carotid bruits Chest: clear to auscultation, no signs of consolidation by percussion or palpation, normal fremitus, symmetrical and full respiratory excursions Cardiovascular: normal position and quality of the apical impulse, regular rhythm, normal first and second heart sounds, no murmurs, rubs or gallops Abdomen: no tenderness or distention, no masses by palpation, no abnormal pulsatility or arterial bruits, normal bowel sounds, no hepatosplenomegaly Extremities: no clubbing, cyanosis or edema; 2+ radial, ulnar and brachial pulses bilaterally; 2+ right femoral, posterior  tibial and dorsalis pedis pulses; 2+ left femoral, posterior tibial and dorsalis pedis pulses; no subclavian or femoral bruits Neurological: grossly nonfocal Psych: Normal mood and affect     Wt Readings from Last 3 Encounters:  03/30/22 140 lb (63.5 kg)  01/19/22 138 lb (62.6 kg)  10/08/21 137 lb 6.4 oz (62.3 kg)  Studies/Labs Reviewed:   EKG:  EKG is ordered today and shows atrial paced ventricular sensed rhythm with mildly prolonged AV delay 218 ms, otherwise normal tracing.  recent Labs: 06/03/2021: Magnesium 2.2; TSH 3.37 08/06/2021: ALT 26; BUN 14; Creatinine 0.75; Hemoglobin 13.9; Platelet Count 217; Potassium 4.1; Sodium 142  Lipid Panel     Component Value Date/Time   CHOL 182 06/03/2021 1054   CHOL 144 11/17/2018 1129   TRIG 191.0 (H) 06/03/2021 1054   HDL 47.40 06/03/2021 1054   HDL 41 11/17/2018 1129   CHOLHDL 4 06/03/2021 1054   VLDL 38.2 06/03/2021 1054   LDLCALC 96 06/03/2021 1054   LDLCALC 74 11/17/2018 1129   LABVLDL 29 11/17/2018 1129     ASSESSMENT:    1. Acute on chronic diastolic heart failure ([place])   2. Essential hypertension   3. SSS (sick sinus syndrome) ([place])   4. Pacemaker   5. Paroxysmal atrial tachycardia   6. History of pericarditis      PLAN:  In order of problems listed above:  CHF: At a weight of 140 pounds she now has symptoms and signs of hypervolemia, albeit mild (NYHA functional class II, elevated jugular venous pulsations.  We will reassess her "dry weight" lower at 135-137 pounds per our office scale (she will have to adjust this to her home scale readings).   Echo in 2022 showed findings of pseudo normal mitral inflow pattern.  With daily diuretic dosing in the past she develops symptoms of orthostatic hypotension and will try to avoid this if possible.  Add SGLT2 inhibitor (discussed the potential risk for increased urinary tract infection and genital yeast infections with this medication.  Reviewed potential risk of  Fournier's gangrene, not common but dangerous).  Take furosemide to keep her weight less than 137 pounds, as needed only.  Avoid NSAIDs and sodium rich foods.  Unfortunately she seems to have a rather tight margin between hypovolemia and symptomatic hypervolemia.  Interestingly, her unexplained episodes of hypotension do not appear to correlate with her volume status.  She has labs scheduled for September 20 already. HTN: Her episodes of hypotension occur once or twice a year and do not appear to have any correlation with other health problems that I can identify or volume status.  Usually last for few weeks and resolve spontaneously.  The mechanism remains uncertain.  Blood pressure is currently in optimal range. SSS: Virtually 100% atrial paced, ventricular sensed.  Good heart rate histogram based on the current sensor settings. PM: Normal device function, generator changed roughly 6 months ago.  Continue remote downloads every 3 months. PAT/PAF: Recently with minimal evidence of arrhythmia (one 3-second episode of PAT since her last appointment).  Outside of the initial period of post pacemaker implantation pericarditis when she had atrial fibrillation she has had extremely brief bursts of atrial fib, maximum 90 seconds.  She has not had any atrial fibrillation since this new generator was implanted.  Anticoagulation does not appear to be indicated. History of post procedural acute pericarditis after pacemaker implantation, resolved. Had atrial fibrillation related to acute pericarditis, which has not recurred in significant amounts over the ensuing years. No evidence of constrictive physiology on echo in 2018 or 2022. She has not received chest radiation therapy for lymphoma, just chemotherapy.  Consider repeat echo or even cardiac MRI if signs of heart failure worsen, to look for signs of constriction.   Medication Adjustments/Labs and Tests Ordered: Current medicines are reviewed at length with the  patient  today.  Concerns regarding medicines are outlined above.  Medication changes, Labs and Tests ordered today are listed in the Patient Instructions below. Patient Instructions  Medication Instructions:  START Jardiance 10 mg once daily  *If you need a refill on your cardiac medications before your next appointment, please call your pharmacy*   Lab Work: None ordered If you have labs (blood work) drawn today and your tests are completely normal, you will receive your results only by: [place] (if you have MyChart) OR A paper copy in the mail If you have any lab test that is abnormal or we need to change your treatment, we will call you to review the results.   Testing/Procedures: None ordered   Follow-Up: At [facility], you and your health needs are our priority.  As part of our continuing mission to provide you with exceptional heart care, we have created designated Provider Care Teams.  These Care Teams include your primary Cardiologist (physician) and Advanced Practice Providers (APPs -  Physician Assistants and Nurse Practitioners) who all work together to provide you with the care you need, when you need it.  We recommend signing up for the patient portal called "MyChart".  Sign up information is provided on this After Visit Summary.  MyChart is used to connect with patients for Virtual Visits (Telemedicine).  Patients are able to view lab/test results, encounter notes, upcoming appointments, etc.  Non-urgent messages can be sent to your provider as well.   To learn more about what you can do with MyChart, go to [email or web address].    Your next appointment:   4 week(s) on a device day  The format for your next appointment:   In Person  Provider:   Sanda Klein, MD     Other Instructions Call us with your weight on your home scale when you get home at [phone].     Signed, Sanda Klein, MD  03/30/2022 2:32 PM    [address], [place], [place]  [address] Phone: [phone]; Fax: [phone]

## 2022-03-30 NOTE — Patient Instructions (Signed)
Medication Instructions:  START Jardiance 10 mg once daily  *If you need a refill on your cardiac medications before your next appointment, please call your pharmacy*   Lab Work: None ordered If you have labs (blood work) drawn today and your tests are completely normal, you will receive your results only by: [place] (if you have MyChart) OR A paper copy in the mail If you have any lab test that is abnormal or we need to change your treatment, we will call you to review the results.   Testing/Procedures: None ordered   Follow-Up: At [facility], you and your health needs are our priority.  As part of our continuing mission to provide you with exceptional heart care, we have created designated Provider Care Teams.  These Care Teams include your primary Cardiologist (physician) and Advanced Practice Providers (APPs -  Physician Assistants and Nurse Practitioners) who all work together to provide you with the care you need, when you need it.  We recommend signing up for the patient portal called "MyChart".  Sign up information is provided on this After Visit Summary.  MyChart is used to connect with patients for Virtual Visits (Telemedicine).  Patients are able to view lab/test results, encounter notes, upcoming appointments, etc.  Non-urgent messages can be sent to your provider as well.   To learn more about what you can do with MyChart, go to [email or web address].    Your next appointment:   4 week(s) on a device day  The format for your next appointment:   In Person  Provider:   Sanda Klein, MD     Other Instructions Call us with your weight on your home scale when you get home at [phone].

## 2022-03-31 DIAGNOSIS — M47816 Spondylosis without myelopathy or radiculopathy, lumbar region: Secondary | ICD-10-CM | POA: Diagnosis not present

## 2022-03-31 DIAGNOSIS — M47817 Spondylosis without myelopathy or radiculopathy, lumbosacral region: Secondary | ICD-10-CM | POA: Diagnosis not present

## 2022-04-01 ENCOUNTER — Telehealth: Payer: Self-pay | Admitting: Cardiovascular Disease

## 2022-04-01 NOTE — Telephone Encounter (Signed)
I called her and told her to increase the furosemide to 40 mg daily (20 mg x2) until weight on hom scale is 135 lb. Asked her to call w weight and symptom report on Monday.

## 2022-04-01 NOTE — Telephone Encounter (Signed)
Patient states she was advised to call in to report her weights.  10/05 139.2 lbs 10/04: 138 lbs

## 2022-04-02 DIAGNOSIS — D839 Common variable immunodeficiency, unspecified: Secondary | ICD-10-CM | POA: Diagnosis not present

## 2022-04-09 DIAGNOSIS — D839 Common variable immunodeficiency, unspecified: Secondary | ICD-10-CM | POA: Diagnosis not present

## 2022-04-13 ENCOUNTER — Other Ambulatory Visit: Payer: Self-pay | Admitting: Cardiovascular Disease

## 2022-04-14 ENCOUNTER — Telehealth: Payer: Self-pay | Admitting: Cardiovascular Disease

## 2022-04-14 DIAGNOSIS — I10 Essential (primary) hypertension: Secondary | ICD-10-CM

## 2022-04-14 DIAGNOSIS — I5032 Chronic diastolic (congestive) heart failure: Secondary | ICD-10-CM

## 2022-04-14 DIAGNOSIS — R0602 Shortness of breath: Secondary | ICD-10-CM

## 2022-04-14 NOTE — Telephone Encounter (Signed)
Please reduce amlodipine to 2.5 mg daily. I believe she is scheduled for labs later this week -please make sure that includes BMP and BNP

## 2022-04-14 NOTE — Telephone Encounter (Signed)
Spoke to patient advised Amlodipine refill already sent to pharmacy 10/17.Advised take Jardiance 10 mg daily before breakfast.Stated her weight is down to 132 lbs.Advised only take Lasix 20 mg daily if weight over 140 lbs.Stated she is still sob.Hard to walk from room to room.No swelling.No chest pain.Stated she just don't feel good.I will send message to Dr.Croitoru for advice.

## 2022-04-14 NOTE — Telephone Encounter (Signed)
Spoke to patient Dr.Croitoru's advice given.She will decrease Amlodipine to 2.5 mg daily.She will have a bmet and bnp this week.Orders placed.

## 2022-04-14 NOTE — Telephone Encounter (Signed)
Pt c/o medication issue:  1. Name of Medication: Lisinopril, Amlodipine  Metoprolol and Jardiance  2. How are you currently taking this medication (dosage and times per day)?   3. Are you having a reaction (difficulty breathing--STAT)?   4. What is your medication issue?  She said Blood pressure  is running low -the top number is running low  106/64, 106/90- also said she need clarity on the 4 medicine listed

## 2022-04-15 DIAGNOSIS — I10 Essential (primary) hypertension: Secondary | ICD-10-CM | POA: Diagnosis not present

## 2022-04-15 DIAGNOSIS — R0602 Shortness of breath: Secondary | ICD-10-CM | POA: Diagnosis not present

## 2022-04-15 DIAGNOSIS — I5032 Chronic diastolic (congestive) heart failure: Secondary | ICD-10-CM | POA: Diagnosis not present

## 2022-04-16 DIAGNOSIS — D839 Common variable immunodeficiency, unspecified: Secondary | ICD-10-CM | POA: Diagnosis not present

## 2022-04-17 LAB — BASIC METABOLIC PANEL
BUN/Creatinine Ratio: 21 (ref 12–28)
BUN: 16 mg/dL (ref 8–27)
CO2: 26 mmol/L (ref 20–29)
Calcium: 9.9 mg/dL (ref 8.7–10.3)
Chloride: 105 mmol/L (ref 96–106)
Creatinine, Ser: 0.78 mg/dL (ref 0.57–1.00)
Glucose: 80 mg/dL (ref 70–99)
Potassium: 4.9 mmol/L (ref 3.5–5.2)
Sodium: 143 mmol/L (ref 134–144)
eGFR: 74 mL/min/{1.73_m2} (ref >59)

## 2022-04-17 LAB — BRAIN NATRIURETIC PEPTIDE: BNP: 158.4 pg/mL — ABNORMAL HIGH (ref 0.0–100.0)

## 2022-04-20 ENCOUNTER — Encounter: Payer: Self-pay | Admitting: *Deleted

## 2022-04-22 ENCOUNTER — Telehealth: Payer: Self-pay | Admitting: Cardiovascular Disease

## 2022-04-22 NOTE — Telephone Encounter (Signed)
The patient has been made aware that all labs were normal.

## 2022-04-22 NOTE — Telephone Encounter (Signed)
Patient would like a call back to discuss lab results.

## 2022-04-23 DIAGNOSIS — D839 Common variable immunodeficiency, unspecified: Secondary | ICD-10-CM | POA: Diagnosis not present

## 2022-04-29 ENCOUNTER — Telehealth: Payer: Self-pay | Admitting: Cardiovascular Disease

## 2022-04-29 NOTE — Telephone Encounter (Signed)
I let [name] know that we need a clearance form to fill out. Once we get the form and the nurse fills it out we will put [employer] phone number on it as well.

## 2022-04-29 NOTE — Telephone Encounter (Signed)
Clearance form received for "Radiofrequency ablation."   Sent fax back requesting location of ablation and specific equipment used. Also advised we do not turn off pacemakers but pacemaker will need to be programmed into safe mode by [employer] rep. Patient is dependent.

## 2022-04-29 NOTE — Telephone Encounter (Signed)
Caller stated she would like to get contact information for [employer].

## 2022-04-30 DIAGNOSIS — D839 Common variable immunodeficiency, unspecified: Secondary | ICD-10-CM | POA: Diagnosis not present

## 2022-04-30 NOTE — Telephone Encounter (Signed)
Clearance request completed and faxed back with fax receipt received.  Reviewed with Dr. Quentin Ore.

## 2022-05-04 ENCOUNTER — Ambulatory Visit: Payer: Medicare Other | Attending: Cardiovascular Disease | Admitting: Cardiovascular Disease

## 2022-05-04 ENCOUNTER — Encounter: Payer: Self-pay | Admitting: Cardiovascular Disease

## 2022-05-04 VITALS — BP 120/79 | HR 85 | Ht 63.0 in | Wt 136.4 lb

## 2022-05-04 DIAGNOSIS — I10 Essential (primary) hypertension: Secondary | ICD-10-CM | POA: Diagnosis not present

## 2022-05-04 DIAGNOSIS — I495 Sick sinus syndrome: Secondary | ICD-10-CM | POA: Diagnosis not present

## 2022-05-04 DIAGNOSIS — Z950 Presence of cardiac pacemaker: Secondary | ICD-10-CM | POA: Diagnosis not present

## 2022-05-04 DIAGNOSIS — I4719 Other supraventricular tachycardia: Secondary | ICD-10-CM

## 2022-05-04 DIAGNOSIS — Z8679 Personal history of other diseases of the circulatory system: Secondary | ICD-10-CM

## 2022-05-04 DIAGNOSIS — I5032 Chronic diastolic (congestive) heart failure: Secondary | ICD-10-CM | POA: Diagnosis not present

## 2022-05-04 MED ORDER — LISINOPRIL 10 MG PO TABS: 10.0000 mg | ORAL_TABLET | Freq: Every day | ORAL | 3 refills | Status: DC

## 2022-05-04 NOTE — Patient Instructions (Signed)
Medication Instructions:  Your physician has recommended you make the following change in your medication:   -Decrease lisinopril (zestril) to '10mg'$  once daily.  *If you need a refill on your cardiac medications before your next appointment, please call your pharmacy*    Follow-Up: At [facility], you and your health needs are our priority.  As part of our continuing mission to provide you with exceptional heart care, we have created designated Provider Care Teams.  These Care Teams include your primary Cardiologist (physician) and Advanced Practice Providers (APPs -  Physician Assistants and Nurse Practitioners) who all work together to provide you with the care you need, when you need it.  We recommend signing up for the patient portal called "MyChart".  Sign up information is provided on this After Visit Summary.  MyChart is used to connect with patients for Virtual Visits (Telemedicine).  Patients are able to view lab/test results, encounter notes, upcoming appointments, etc.  Non-urgent messages can be sent to your provider as well.   To learn more about what you can do with MyChart, go to [email or web address].    Your next appointment:   3 month(s)  The format for your next appointment:   In Person  Provider:   Sanda Klein, MD

## 2022-05-04 NOTE — Progress Notes (Signed)
Cardiology Office Note    Date:  05/05/2022   ID:  [name], [name] 1934/12/31, MRN [id number]  PCP:  Janith Lima, MD  Cardiologist:   Sanda Klein, MD   Chief Complaint  Patient presents with   Congestive Heart Failure    History of Present Illness:  [name] is a 87 y.o. female with sinus node dysfunction and implantation of a dual-chamber permanent pacemaker (Medtronic Revo 2013, generator change 09/01/2021), chronic diastolic heart failure, hypertension, non-Hodgkin's lymphoma in remission.    Her blood pressure today is normal, but she still feels wobbly and dizzy when she gets up in the mornings.  Checked orthostatic vitals in the clinic today and they did not really show any evidence of orthostatic hypotension, but her symptoms are compatible with this diagnosis.  Today she weighs just a couple of pounds less than her previous appointment.  She has a prescription for furosemide to take "as needed" for weights over 140 pounds but has not required it.  She is taking Jardiance.  She has not had trouble with orthopnea, PND, lower extremity edema or anything more than her usual NYHA functional class II exertional dyspnea.  She has not had syncope.  She denies palpitations.  She has never complained of chest discomfort.  She is planning to undergo RF ablation in the lumbar spine area for chronic pain.  She has significant lumbar spine scoliosis.  Last echocardiogram in June 2022 showed normal left ventricular systolic function but does show evidence of significant diastolic dysfunction with pseudonormal mitral inflow and elevated left heart filling pressures.  She continues to self administer Hizentra subcutaneous injections weekly.  Comprehensive pacemaker check in the office today shows normal device function.  Estimated generator longevity is almost 13 years (generator change out March 2023).  She has not had any episodes of high ventricular rates or atrial  fibrillation.  She has 85% atrial pacing and only 0.4% ventricular pacing.   Presenting rhythm is atrial paced, ventricular sensed with a slightly prolonged AV delay//.  Shortly after initial pacemaker implantation in 2013, she had problems with paroxysmal atrial fibrillation likely related to microperforation and acute pericarditis.  For many years after she has not had recurrent atrial fibrillation. She did have some shortness of breath in 2018 that responded promptly to diuretics. September 2019, she had a single syncopal events shortly after arriving in [place] after a long airline trip. Echo showed normal left ventricular systolic function but evidence of diastolic dysfunction and increased filling pressure.  She developed problems with hypervolemia while receiving IVIG (Hizentra) infusions, improved when this was switched to the low volume subcutaneous injections.  Periodically she has episodes of hypotension where we have to discontinue all of her antihypertensive medications, but after a few weeks gradually returns to have markedly elevated blood pressure and we restarted her medications.   Past Medical History:  Diagnosis Date   Anemia    pt denies   Arthritis    Atrial fibrillation (HCC)    Cataract    bil   Common variable immunodeficiency (HCC)    Cystocele    history with rectocele, pt denies   GERD (gastroesophageal reflux disease)    H/O hiatal hernia    Hypertension    Hypothyroidism    Non Hodgkin's lymphoma ([place])    dx 2012 and now in remission   Osteoporosis    Pacemaker 09/15/2011   Medtronic Revo   Pericarditis    Pleural effusion, bilateral  history of   Pulmonary nodules    history of   Scoliosis    Varicose vein     Past Surgical History:  Procedure Laterality Date   BREAST SURGERY  07/30/01   biopsy x 3, bilateral   CATARACT EXTRACTION Bilateral    COLONOSCOPY     ESOPHAGOGASTRODUODENOSCOPY (EGD) WITH PROPOFOL N/A 12/10/2020   Procedure:  ESOPHAGOGASTRODUODENOSCOPY (EGD) WITH PROPOFOL;  Surgeon: Ronnette Juniper, MD;  Location: [facility];  Service: Gastroenterology;  Laterality: N/A;  NG Tube Insertion   MASS EXCISION Right 10/28/2012   Procedure: Removal of mass on neck       ;  Surgeon: Adin Hector, MD;  Location: [facility];  Service: General;  Laterality: Right;   PARTIAL HYSTERECTOMY  04/29/2004   PERMANENT PACEMAKER INSERTION N/A 09/15/2011   Procedure: PERMANENT PACEMAKER INSERTION;  Surgeon: Sanda Klein, MD;  Location: [facility];  Service: Cardiovascular;  Laterality: N/A;   PPM GENERATOR CHANGEOUT N/A 09/01/2021   Procedure: PPM GENERATOR CHANGEOUT;  Surgeon: Sanda Klein, MD;  Location: [facility];  Service: Cardiovascular;  Laterality: N/A;   RADIOACTIVE SEED GUIDED EXCISIONAL BREAST BIOPSY Left 08/11/2017   Procedure: LEFT RADIOACTIVE SEED GUIDED EXCISIONAL BREAST BIOPSY ERAS PATHWAY;  Surgeon: Stark Klein, MD;  Location: [facility];  Service: General;  Laterality: Left;   TONSILLECTOMY      Current Medications: Outpatient Medications Prior to Visit  Medication Sig Dispense Refill   acetaminophen (TYLENOL) 500 MG tablet Take 500 mg by mouth every 6 (six) hours as needed (for pain.).      amLODipine (NORVASC) 5 MG tablet Take 1/2 tablet ( 2.5 mg ) daily 180 tablet 3   atorvastatin (LIPITOR) 40 MG tablet Take 40 mg by mouth daily in the afternoon.     Calcium Carbonate-Vitamin D 600-400 MG-UNIT tablet Take 1 tablet by mouth daily.     dexlansoprazole (DEXILANT) 60 MG capsule Take 60 mg by mouth daily.     empagliflozin (JARDIANCE) 10 MG TABS tablet Take 1 tablet (10 mg total) by mouth daily before breakfast. 90 tablet 1   ferrous fumarate (HEMOCYTE - 106 MG FE) 325 (106 Fe) MG TABS tablet Take 1 tablet by mouth daily.     gabapentin (NEURONTIN) 300 MG capsule TAKE 1 CAPSULE(300 MG) BY MOUTH FOUR TIMES DAILY 360 capsule 1   Immune Globulin, Human, (HIZENTRA) 4 GM/20ML SOLN Inject 8 g into the skin once a week.  Thursday's     levothyroxine (SYNTHROID) 100 MCG tablet TAKE 1 TABLET(100 MCG) BY MOUTH DAILY 90 tablet 0   Magnesium Oxide (MAG-OX 400 PO) Take 400 mg by mouth 2 (two) times a week.     metoprolol tartrate (LOPRESSOR) 50 MG tablet Take 1 tablet (50 mg total) by mouth 2 (two) times daily. 180 tablet 3   omeprazole (PRILOSEC) 40 MG capsule TAKE 1 CAPSULE(40 MG) BY MOUTH DAILY (Patient taking differently: Take 40 mg by mouth daily.) 90 capsule 0   potassium chloride (KLOR-CON) 10 MEQ tablet TAKE 1 TABLET(10 MEQ) BY MOUTH DAILY (Patient taking differently: Take 10 mEq by mouth daily.) 90 tablet 0   thiamine 100 MG tablet Take 1 tablet (100 mg total) by mouth daily. 90 tablet 1   lisinopril (ZESTRIL) 20 MG tablet Take 1 tablet (20 mg total) by mouth daily. (Patient taking differently: Take 20 mg by mouth 2 (two) times daily.) 90 tablet 1   EPINEPHrine 0.3 mg/0.3 mL IJ SOAJ injection Inject 0.3 mg into the muscle as  needed for anaphylaxis. (Patient not taking: Reported on 05/04/2022)     furosemide (LASIX) 20 MG tablet TAKE 1 TABLET BY MOUTH EVERY DAY AS NEEDED FOR A WEIGHT OVER 140LBS (Patient not taking: Reported on 05/04/2022) 30 tablet 1   No facility-administered medications prior to visit.     Allergies:   Tramadol, Amoxicillin, Buprenorphine, Codeine, Demerol, Dextromethorphan-guaifenesin, Morphine and related, Sulfonamide derivatives, and Vicodin [hydrocodone-acetaminophen]   Social History   Socioeconomic History   Marital status: Widowed    Spouse name: Not on file   Number of children: 3   Years of education: Not on file   Highest education level: Not on file  Occupational History   Occupation: [employer]  Tobacco Use   Smoking status: Never   Smokeless tobacco: Never  Vaping Use   Vaping Use: Never used  Substance and Sexual Activity   Alcohol use: Yes    Comment: 1 glass of wine occasionally   Drug use: No   Sexual activity: Not on file  Other Topics Concern   Not  on file  Social History Narrative   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  She lives alone in a 3-story home.  There is a lift chair which she does not use (placed for her husband).      Right handed    Social Determinants of Health   Financial Resource Strain: Not on file  Food Insecurity: Not on file  Transportation Needs: Not on file  Physical Activity: Not on file  Stress: Not on file  Social Connections: Not on file     Family History:  The patient's family history includes Breast cancer in her sister; Heart disease in her brother and daughter; Lymphoma in her daughter; Stroke in her father and mother.   ROS:   Please see the history of present illness.    ROS All other systems reviewed and are negative.   PHYSICAL EXAM:   VS:  BP 120/79 (BP Location: Left Arm, Patient Position: Sitting, Cuff Size: Normal)   Pulse 85   Ht '5\' 3"'$  (1.6 m)   Wt 61.9 kg   SpO2 97%   BMI 24.16 kg/m    Repeat blood pressure check Sitting 110/72, standing 120/74, standing 3 minutes 130/86 mmHg.  General: Alert, oriented x3, no distress, Healthy left subclavian pacemaker site Head: no evidence of trauma, PERRL, EOMI, no exophtalmos or lid lag, no myxedema, no xanthelasma; normal ears, nose and oropharynx Neck: normal jugular venous pulsations and no hepatojugular reflux; brisk carotid pulses without delay and no carotid bruits Chest: clear to auscultation, no signs of consolidation by percussion or palpation, normal fremitus, symmetrical and full respiratory excursions Cardiovascular: normal position and quality of the apical impulse, regular rhythm, normal first and second heart sounds, no murmurs, rubs or gallops Abdomen: no tenderness or distention, no masses by palpation, no abnormal pulsatility or arterial bruits, normal bowel sounds, no hepatosplenomegaly Extremities: no clubbing, cyanosis or edema; 2+ radial, ulnar and brachial pulses  bilaterally; 2+ right femoral, posterior tibial and dorsalis pedis pulses; 2+ left femoral, posterior tibial and dorsalis pedis pulses; no subclavian or femoral bruits Neurological: grossly nonfocal Psych: Normal mood and affect      Wt Readings from Last 3 Encounters:  05/04/22 61.9 kg  03/30/22 63.5 kg  01/19/22 62.6 kg      Studies/Labs Reviewed:   EKG:  EKG is not ordered today.  Personally reviewed her most recent electrocardiogram which shows atrial  paced ventricular sensed rhythm with mildly prolonged AV delay 218 ms, otherwise normal tracing.  recent Labs: 06/03/2021: Magnesium 2.2; TSH 3.37 08/06/2021: ALT 26; Hemoglobin 13.9; Platelet Count 217 04/15/2022: BNP 158.4; BUN 16; Creatinine, Ser 0.78; Potassium 4.9; Sodium 143  Lipid Panel     Component Value Date/Time   CHOL 182 06/03/2021 1054   CHOL 144 11/17/2018 1129   TRIG 191.0 (H) 06/03/2021 1054   HDL 47.40 06/03/2021 1054   HDL 41 11/17/2018 1129   CHOLHDL 4 06/03/2021 1054   VLDL 38.2 06/03/2021 1054   LDLCALC 96 06/03/2021 1054   LDLCALC 74 11/17/2018 1129   LABVLDL 29 11/17/2018 1129     ASSESSMENT:    1. Chronic diastolic heart failure ([place])   2. Essential hypertension   3. SSS (sick sinus syndrome) ([place])   4. Pacemaker   5. Paroxysmal atrial tachycardia   6. History of pericarditis       PLAN:  In order of problems listed above:  CHF: She had evidence of hypervolemia at a weight of 140 pounds.  Currently seems to be doing within our target "dry weight" range of 135-137 pounds per our office scale.  Echo in 2022 showed findings of pseudo normal mitral inflow pattern.  With daily diuretic dosing in the past she develops symptoms of orthostatic hypotension and will try to avoid this if possible.  Currently on SGLT2 inhibitor and has not had any side effects so far.   Take furosemide to keep her weight less than 137 pounds, as needed only.  Avoid NSAIDs and sodium rich foods.  Unfortunately she seems  to have a rather tight margin between hypovolemia and symptomatic hypervolemia.  Interestingly, her unexplained episodes of hypotension do not appear to correlate with her volume status.   HTN: She has some symptoms of orthostatic hypotension, although I was not able to demonstrate this in the clinic today.  We will reduce her lisinopril dose to 10 mg once daily.  Tolerate systolic blood pressure around 140 mmHg.  Her episodes of hypotension occur once or twice a year and do not appear to have any correlation with other health problems that I can identify or volume status.  Usually last for few weeks and resolve spontaneously.  The mechanism remains uncertain.  Blood pressure is currently in optimal range. SSS: Almost always atrial paced, ventricular sensed rhythm with a good heart rate histogram on the current pacemaker sensor settings. PM: Recent generator change out, normal device function.  Remote downloads every 3 months. PAT/PAF: Very little evidence of atrial tachycardia since her generator change out.  Outside of the initial period of post pacemaker implantation pericarditis when she had atrial fibrillation she has had extremely brief bursts of atrial fib, maximum 90 seconds.  She has not had any atrial fibrillation since this new generator was implanted.  Anticoagulation does not appear to be indicated. History of post procedural acute pericarditis after pacemaker implantation, resolved. Had atrial fibrillation related to acute pericarditis, which has not recurred in significant amounts over the ensuing years. No evidence of constrictive physiology on echo in 2018 or 2022. She has not received chest radiation therapy for lymphoma, just chemotherapy.  Consider repeat echo or even cardiac MRI if signs of heart failure worsen, to look for signs of constriction.   Medication Adjustments/Labs and Tests Ordered: Current medicines are reviewed at length with the patient today.  Concerns regarding medicines  are outlined above.  Medication changes, Labs and Tests ordered today are listed in  the Patient Instructions below. Patient Instructions  Medication Instructions:  Your physician has recommended you make the following change in your medication:   -Decrease lisinopril (zestril) to '10mg'$  once daily.  *If you need a refill on your cardiac medications before your next appointment, please call your pharmacy*    Follow-Up: At [facility], you and your health needs are our priority.  As part of our continuing mission to provide you with exceptional heart care, we have created designated Provider Care Teams.  These Care Teams include your primary Cardiologist (physician) and Advanced Practice Providers (APPs -  Physician Assistants and Nurse Practitioners) who all work together to provide you with the care you need, when you need it.  We recommend signing up for the patient portal called "MyChart".  Sign up information is provided on this After Visit Summary.  MyChart is used to connect with patients for Virtual Visits (Telemedicine).  Patients are able to view lab/test results, encounter notes, upcoming appointments, etc.  Non-urgent messages can be sent to your provider as well.   To learn more about what you can do with MyChart, go to [email or web address].    Your next appointment:   3 month(s)  The format for your next appointment:   In Person  Provider:   Sanda Klein, MD       Signed, Sanda Klein, MD  05/05/2022 6:48 PM    [address], [place], [place]  [address] Phone: [phone]; Fax: [phone]

## 2022-05-05 ENCOUNTER — Encounter: Payer: Self-pay | Admitting: Cardiovascular Disease

## 2022-05-06 DIAGNOSIS — M47816 Spondylosis without myelopathy or radiculopathy, lumbar region: Secondary | ICD-10-CM | POA: Diagnosis not present

## 2022-05-06 DIAGNOSIS — M47817 Spondylosis without myelopathy or radiculopathy, lumbosacral region: Secondary | ICD-10-CM | POA: Diagnosis not present

## 2022-05-14 ENCOUNTER — Other Ambulatory Visit: Payer: Self-pay | Admitting: Internal Medicine

## 2022-05-14 DIAGNOSIS — E039 Hypothyroidism, unspecified: Secondary | ICD-10-CM

## 2022-05-28 DIAGNOSIS — M47816 Spondylosis without myelopathy or radiculopathy, lumbar region: Secondary | ICD-10-CM | POA: Diagnosis not present

## 2022-05-28 DIAGNOSIS — M7918 Myalgia, other site: Secondary | ICD-10-CM | POA: Diagnosis not present

## 2022-05-29 DIAGNOSIS — D839 Common variable immunodeficiency, unspecified: Secondary | ICD-10-CM | POA: Diagnosis not present

## 2022-06-02 ENCOUNTER — Ambulatory Visit (INDEPENDENT_AMBULATORY_CARE_PROVIDER_SITE_OTHER): Payer: Medicare Other

## 2022-06-02 DIAGNOSIS — I495 Sick sinus syndrome: Secondary | ICD-10-CM | POA: Diagnosis not present

## 2022-06-02 LAB — CUP PACEART REMOTE DEVICE CHECK
Battery Remaining Longevity: 154 mo
Battery Voltage: 3.12 V
Brady Statistic AP VP Percent: 0.07 %
Brady Statistic AP VS Percent: 90.12 %
Brady Statistic AS VP Percent: 0 %
Brady Statistic AS VS Percent: 9.81 %
Brady Statistic RA Percent Paced: 90.23 %
Brady Statistic RV Percent Paced: 0.07 %
Date Time Interrogation Session: 20231204220651
Implantable Lead Connection Status: 753985
Implantable Lead Connection Status: 753985
Implantable Lead Implant Date: 20130319
Implantable Lead Implant Date: 20130319
Implantable Lead Location: 753859
Implantable Lead Location: 753860
Implantable Pulse Generator Implant Date: 20230306
Lead Channel Impedance Value: 342 Ohm
Lead Channel Impedance Value: 399 Ohm
Lead Channel Impedance Value: 418 Ohm
Lead Channel Impedance Value: 456 Ohm
Lead Channel Pacing Threshold Amplitude: 0.625 V
Lead Channel Pacing Threshold Amplitude: 0.875 V
Lead Channel Pacing Threshold Pulse Width: 0.4 ms
Lead Channel Pacing Threshold Pulse Width: 0.4 ms
Lead Channel Sensing Intrinsic Amplitude: 1.75 mV
Lead Channel Sensing Intrinsic Amplitude: 1.75 mV
Lead Channel Sensing Intrinsic Amplitude: 16.875 mV
Lead Channel Sensing Intrinsic Amplitude: 16.875 mV
Lead Channel Setting Pacing Amplitude: 1.5 V
Lead Channel Setting Pacing Amplitude: 2 V
Lead Channel Setting Pacing Pulse Width: 0.4 ms
Lead Channel Setting Sensing Sensitivity: 0.9 mV
Zone Setting Status: 755011
Zone Setting Status: 755011

## 2022-06-12 DIAGNOSIS — D839 Common variable immunodeficiency, unspecified: Secondary | ICD-10-CM | POA: Diagnosis not present

## 2022-06-18 DIAGNOSIS — D839 Common variable immunodeficiency, unspecified: Secondary | ICD-10-CM | POA: Diagnosis not present

## 2022-06-19 IMAGING — MG DIGITAL DIAGNOSTIC BILAT W/ TOMO W/ CAD
8 series · 8 of 24 positions shown · non-contrast
Comparison: Previous exam(s).

CLINICAL DATA: 86-year-old female presenting for annual bilateral
mammogram in for six-month follow-up of a probably benign left
breast mass.

EXAM:
DIGITAL DIAGNOSTIC BILATERAL MAMMOGRAM WITH TOMOSYNTHESIS AND CAD;
ULTRASOUND LEFT BREAST LIMITED
TECHNIQUE: Bilateral digital diagnostic mammography and breast tomosynthesis
was performed. The images were evaluated with computer-aided
detection.; Targeted ultrasound examination of the left breast was
performed.

[L MLO synth-2D]
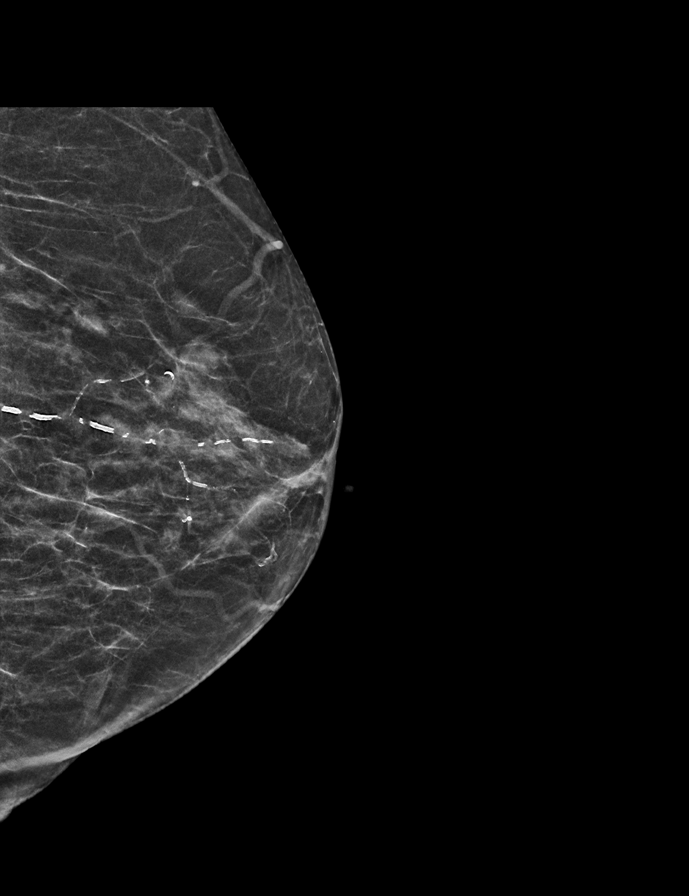

[R MLO synth-2D]
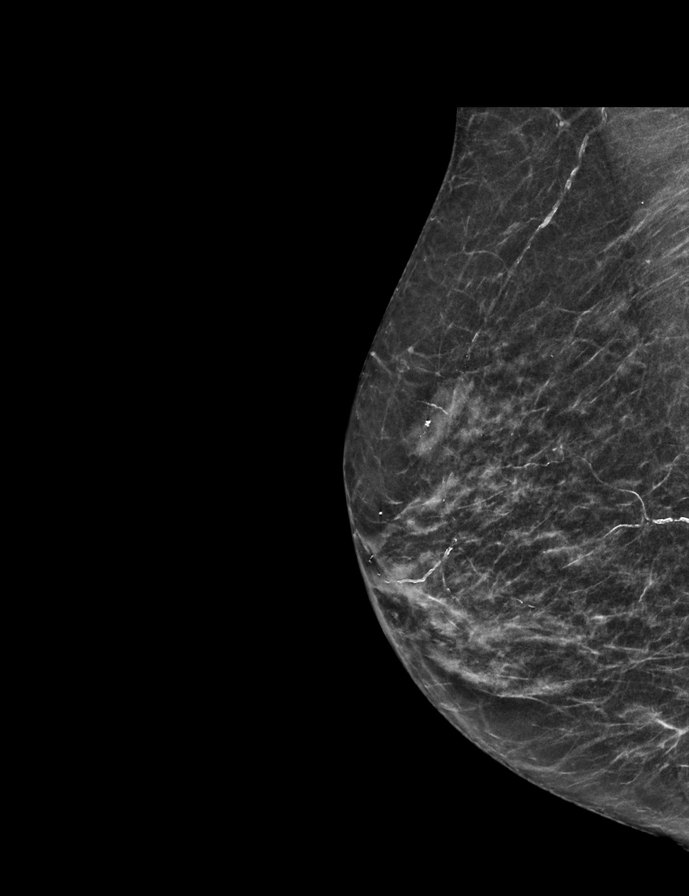

[R CC synth-2D]
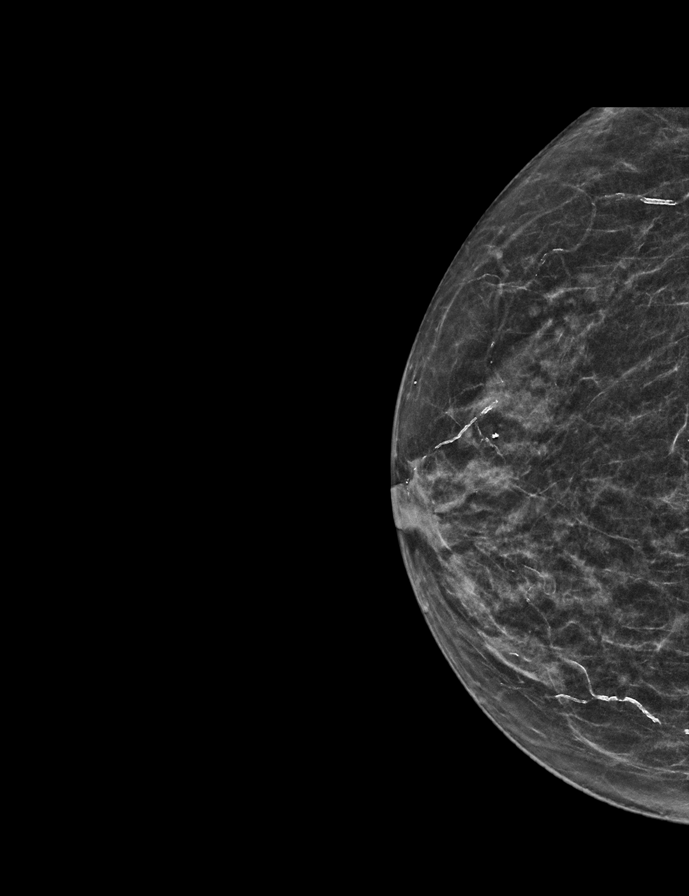

[L CC synth-2D]
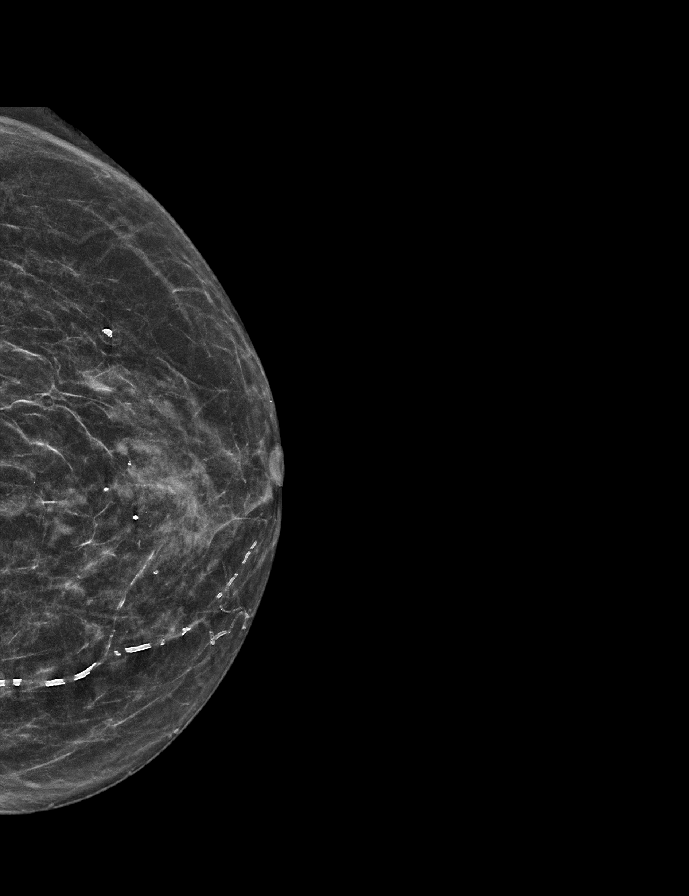

[L CC tomo · tomo slice 23/45.0]
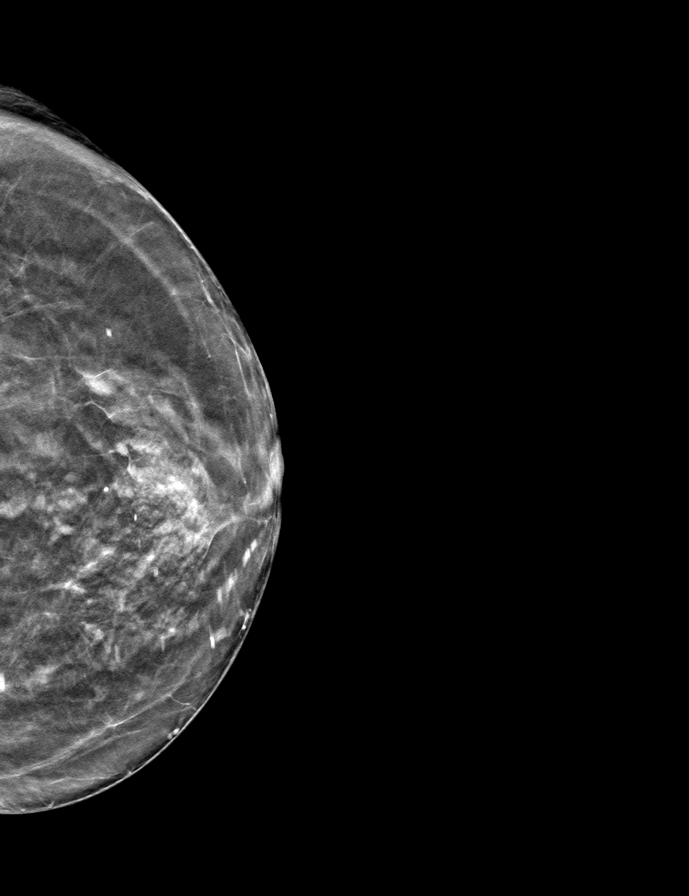

[L MLO tomo · tomo slice 21/42.0]
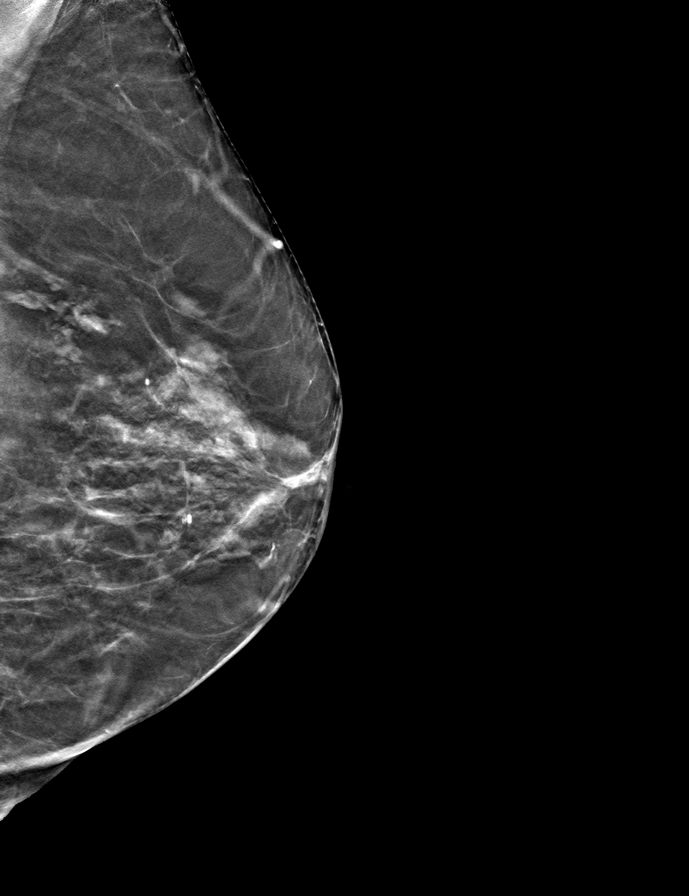

[R MLO tomo · tomo slice 23/44.0]
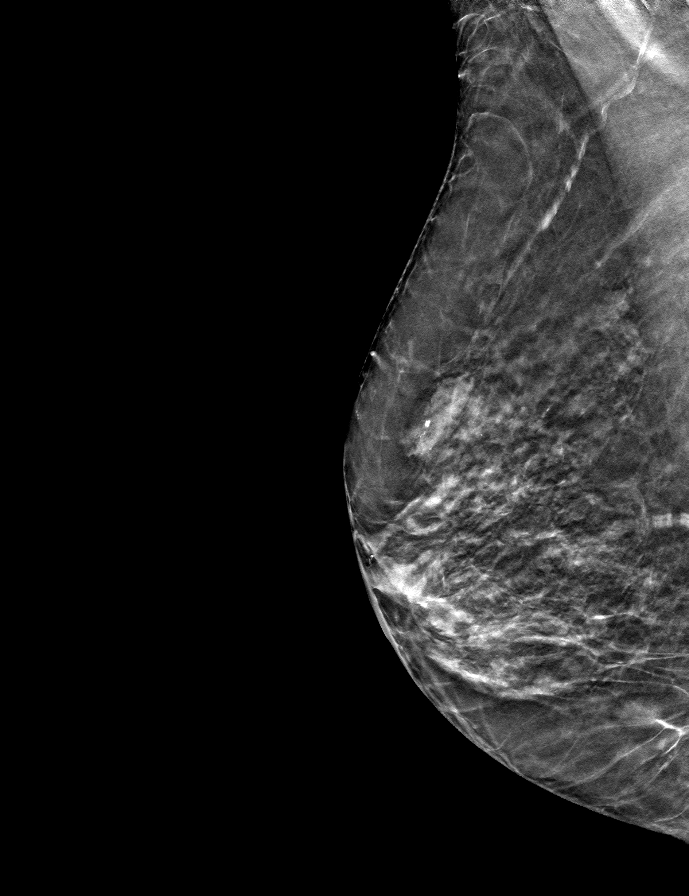

[R CC tomo · tomo slice 23/46.0]
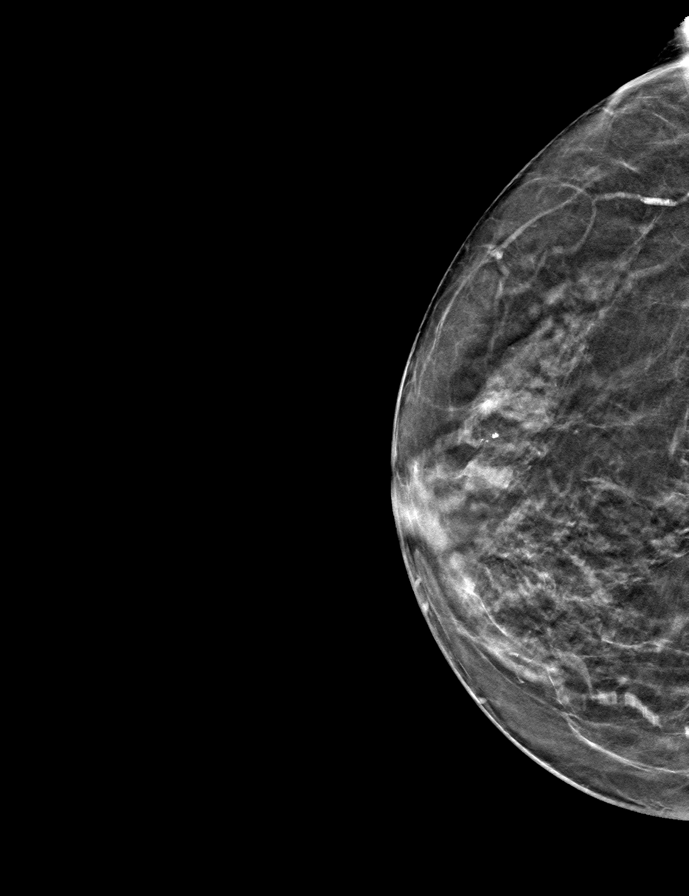

[8 of 24 positions shown; findings below may reference images not displayed]

ACR Breast Density Category c: The breast tissue is heterogeneously
dense, which may obscure small masses.
FINDINGS: There are no suspicious mammographic findings in either breast. The
parenchymal pattern is stable.

Targeted ultrasound is performed, showing a stable round,
circumscribed nearly anechoic mass at the 4 o'clock position 6 cm
from the nipple. It measures 4 x 3 x 4 mm (previously 5 x 5 x 3 mm).
IMPRESSION: 1. Stable, probably benign left breast mass. Recommend continued
short-term imaging follow-up.
2. No mammographic evidence of malignancy in either breast.

RECOMMENDATION:
Left breast ultrasound in 6 months.

I have discussed the findings and recommendations with the patient.
If applicable, a reminder letter will be sent to the patient
regarding the next appointment.

BI-RADS CATEGORY  3: Probably benign.

## 2022-06-25 DIAGNOSIS — D839 Common variable immunodeficiency, unspecified: Secondary | ICD-10-CM | POA: Diagnosis not present

## 2022-06-30 NOTE — Progress Notes (Signed)
Remote pacemaker transmission.   

## 2022-07-02 ENCOUNTER — Other Ambulatory Visit: Payer: Self-pay | Admitting: Cardiovascular Disease

## 2022-07-02 ENCOUNTER — Other Ambulatory Visit: Payer: Self-pay | Admitting: Internal Medicine

## 2022-07-02 DIAGNOSIS — E039 Hypothyroidism, unspecified: Secondary | ICD-10-CM

## 2022-07-02 DIAGNOSIS — G6289 Other specified polyneuropathies: Secondary | ICD-10-CM

## 2022-07-03 DIAGNOSIS — D839 Common variable immunodeficiency, unspecified: Secondary | ICD-10-CM | POA: Diagnosis not present

## 2022-07-08 DIAGNOSIS — M47816 Spondylosis without myelopathy or radiculopathy, lumbar region: Secondary | ICD-10-CM | POA: Diagnosis not present

## 2022-07-08 DIAGNOSIS — M7918 Myalgia, other site: Secondary | ICD-10-CM | POA: Diagnosis not present

## 2022-07-08 DIAGNOSIS — M791 Myalgia, unspecified site: Secondary | ICD-10-CM | POA: Diagnosis not present

## 2022-07-10 DIAGNOSIS — D839 Common variable immunodeficiency, unspecified: Secondary | ICD-10-CM | POA: Diagnosis not present

## 2022-07-17 DIAGNOSIS — D839 Common variable immunodeficiency, unspecified: Secondary | ICD-10-CM | POA: Diagnosis not present

## 2022-07-24 DIAGNOSIS — D839 Common variable immunodeficiency, unspecified: Secondary | ICD-10-CM | POA: Diagnosis not present

## 2022-07-27 ENCOUNTER — Other Ambulatory Visit: Payer: Self-pay | Admitting: Cardiovascular Disease

## 2022-07-29 ENCOUNTER — Telehealth: Payer: Self-pay | Admitting: Cardiovascular Disease

## 2022-07-29 MED ORDER — LISINOPRIL 10 MG PO TABS: 10.0000 mg | ORAL_TABLET | Freq: Every day | ORAL | 1 refills | Status: DC

## 2022-07-29 NOTE — Telephone Encounter (Signed)
Refills has been sent to the pharmacy. 

## 2022-07-29 NOTE — Telephone Encounter (Signed)
*  STAT* If patient is at the pharmacy, call can be transferred to refill team.   1. Which medications need to be refilled? (please list name of each medication and dose if known)   lisinopril (ZESTRIL) 10 MG tablet   2. Which pharmacy/location (including street and city if local pharmacy) is medication to be sent to?  [place], [place] AT [address]   3. Do they need a 30 day or 90 day supply?   90 day  Patient stated she has a few tablets left.

## 2022-07-31 DIAGNOSIS — D839 Common variable immunodeficiency, unspecified: Secondary | ICD-10-CM | POA: Diagnosis not present

## 2022-08-03 ENCOUNTER — Inpatient Hospital Stay: Payer: Medicare Other | Attending: Internal Medicine

## 2022-08-03 ENCOUNTER — Other Ambulatory Visit: Payer: Self-pay

## 2022-08-03 ENCOUNTER — Ambulatory Visit (HOSPITAL_COMMUNITY)
Admission: RE | Admit: 2022-08-03 | Discharge: 2022-08-03 | Disposition: A | Discharge status: Home / Self Care | Payer: Medicare Other | Source: Ambulatory Visit | Attending: Internal Medicine | Admitting: Internal Medicine

## 2022-08-03 DIAGNOSIS — Z8572 Personal history of non-Hodgkin lymphomas: Secondary | ICD-10-CM | POA: Insufficient documentation

## 2022-08-03 DIAGNOSIS — Z9221 Personal history of antineoplastic chemotherapy: Secondary | ICD-10-CM | POA: Insufficient documentation

## 2022-08-03 DIAGNOSIS — J90 Pleural effusion, not elsewhere classified: Secondary | ICD-10-CM | POA: Diagnosis not present

## 2022-08-03 DIAGNOSIS — C8221 Follicular lymphoma grade III, unspecified, lymph nodes of head, face, and neck: Secondary | ICD-10-CM

## 2022-08-03 DIAGNOSIS — C8290 Follicular lymphoma, unspecified, unspecified site: Secondary | ICD-10-CM | POA: Diagnosis not present

## 2022-08-03 LAB — CMP (CANCER CENTER ONLY)
ALT: 17 U/L (ref 0–44)
AST: 30 U/L (ref 15–41)
Albumin: 4 g/dL (ref 3.5–5.0)
Alkaline Phosphatase: 59 U/L (ref 38–126)
Anion gap: 5 (ref 5–15)
BUN: 15 mg/dL (ref 8–23)
CO2: 29 mmol/L (ref 22–32)
Calcium: 9.9 mg/dL (ref 8.9–10.3)
Chloride: 104 mmol/L (ref 98–111)
Creatinine: 0.78 mg/dL (ref 0.44–1.00)
GFR, Estimated: >60 mL/min (ref >60)
Glucose, Bld: 79 mg/dL (ref 70–99)
Potassium: 4.2 mmol/L (ref 3.5–5.1)
Sodium: 138 mmol/L (ref 135–145)
Total Bilirubin: 0.3 mg/dL (ref 0.3–1.2)
Total Protein: 7 g/dL (ref 6.5–8.1)

## 2022-08-03 LAB — CBC WITH DIFFERENTIAL (CANCER CENTER ONLY)
Abs Immature Granulocytes: 0.05 10*3/uL (ref 0.00–0.07)
Basophils Absolute: 0.1 10*3/uL (ref 0.0–0.1)
Basophils Relative: 1 %
Eosinophils Absolute: 0.3 10*3/uL (ref 0.0–0.5)
Eosinophils Relative: 4 %
HCT: 45.9 % (ref 36.0–46.0)
Hemoglobin: 15.8 g/dL — ABNORMAL HIGH (ref 12.0–15.0)
Immature Granulocytes: 1 %
Lymphocytes Relative: 35 %
Lymphs Abs: 2.5 10*3/uL (ref 0.7–4.0)
MCH: 33.5 pg (ref 26.0–34.0)
MCHC: 34.4 g/dL (ref 30.0–36.0)
MCV: 97.5 fL (ref 80.0–100.0)
Monocytes Absolute: 0.5 10*3/uL (ref 0.1–1.0)
Monocytes Relative: 7 %
Neutro Abs: 3.7 10*3/uL (ref 1.7–7.7)
Neutrophils Relative %: 52 %
Platelet Count: 147 10*3/uL — ABNORMAL LOW (ref 150–400)
RBC: 4.71 MIL/uL (ref 3.87–5.11)
RDW: 12.9 % (ref 11.5–15.5)
WBC Count: 7 10*3/uL (ref 4.0–10.5)
nRBC: 0 % (ref 0.0–0.2)

## 2022-08-03 LAB — LACTATE DEHYDROGENASE: LDH: 367 U/L — ABNORMAL HIGH (ref 98–192)

## 2022-08-03 MED ORDER — SODIUM CHLORIDE (PF) 0.9 % IJ SOLN
INTRAMUSCULAR | Status: AC
  Filled 2022-08-03: qty 50

## 2022-08-03 MED ORDER — IOHEXOL 9 MG/ML PO SOLN
ORAL | Status: AC
  Filled 2022-08-03: qty 1000

## 2022-08-03 MED ORDER — IOHEXOL 9 MG/ML PO SOLN
1000.0000 mL | ORAL | Status: AC
  Administered 2022-08-03: 1000 mL via ORAL

## 2022-08-03 MED ORDER — IOHEXOL 300 MG/ML  SOLN
80.0000 mL | Freq: Once | INTRAMUSCULAR | Status: AC | PRN
  Administered 2022-08-03: 80 mL via INTRAVENOUS

## 2022-08-06 ENCOUNTER — Inpatient Hospital Stay: Payer: Medicare Other | Admitting: Internal Medicine

## 2022-08-06 VITALS — BP 149/92 | HR 89 | Temp 98.2°F | Resp 17 | Wt 139.3 lb

## 2022-08-06 DIAGNOSIS — C8218 Follicular lymphoma grade II, lymph nodes of multiple sites: Secondary | ICD-10-CM | POA: Diagnosis not present

## 2022-08-06 DIAGNOSIS — Z8572 Personal history of non-Hodgkin lymphomas: Secondary | ICD-10-CM | POA: Diagnosis not present

## 2022-08-06 DIAGNOSIS — Z9221 Personal history of antineoplastic chemotherapy: Secondary | ICD-10-CM | POA: Diagnosis not present

## 2022-08-06 NOTE — Progress Notes (Signed)
[place] [phone]   [phone]  OFFICE PROGRESS NOTE  Janith Lima, MD [place] [id number]  DIAGNOSIS: Recurrent non-Hodgkin lymphoma, follicular center cell type with predominant follicular pattern favor high-grade (grade 3/3) diagnosed in November 2012.  PRIOR THERAPY: 1) Status post treatment with Rituxan weekly for 3 doses in addition to 3 tablets of Afinitor at [facility] in [place].  2) Status post surgical excision of the right neck mass under the care of Dr. Johney Maine on 10/28/2012.  3)  Maintenance Rituxan 375 mg/M2 every 2 months status post 12 cycles, last dose was given 10/01/2014.  CURRENT THERAPY: Observation.  INTERVAL HISTORY: [name] 88 y.o. female returns to the clinic today for annual follow-up visit.  The patient is feeling fine today with no concerning complaints.  She denied having any current weight loss or night sweats.  She has no palpable lymphadenopathy.  She has no nausea, vomiting, diarrhea or constipation.  She has no chest pain, shortness of breath, cough or hemoptysis.  She is here today for evaluation with repeat lab work as well as CT scan of the chest, abdomen and pelvis for restaging of her disease.   MEDICAL HISTORY: Past Medical History:  Diagnosis Date   Anemia    pt denies   Arthritis    Atrial fibrillation (HCC)    Cataract    bil   Common variable immunodeficiency (HCC)    Cystocele    history with rectocele, pt denies   GERD (gastroesophageal reflux disease)    H/O hiatal hernia    Hypertension    Hypothyroidism    Non Hodgkin's lymphoma ([place])    dx 2012 and now in remission   Osteoporosis    Pacemaker 09/15/2011   Medtronic Revo   Pericarditis    Pleural effusion, bilateral    history of   Pulmonary nodules    history of   Scoliosis    Varicose vein     ALLERGIES:  is allergic to tramadol, amoxicillin, buprenorphine, codeine, demerol,  dextromethorphan-guaifenesin, morphine and related, sulfonamide derivatives, and vicodin [hydrocodone-acetaminophen].  MEDICATIONS:  Current Outpatient Medications  Medication Sig Dispense Refill   acetaminophen (TYLENOL) 500 MG tablet Take 500 mg by mouth every 6 (six) hours as needed (for pain.).      amLODipine (NORVASC) 5 MG tablet Take 1/2 tablet ( 2.5 mg ) daily 180 tablet 3   atorvastatin (LIPITOR) 40 MG tablet Take 40 mg by mouth daily in the afternoon.     Calcium Carbonate-Vitamin D 600-400 MG-UNIT tablet Take 1 tablet by mouth daily.     dexlansoprazole (DEXILANT) 60 MG capsule Take 60 mg by mouth daily.     empagliflozin (JARDIANCE) 10 MG TABS tablet Take 1 tablet (10 mg total) by mouth daily before breakfast. 90 tablet 1   EPINEPHrine 0.3 mg/0.3 mL IJ SOAJ injection Inject 0.3 mg into the muscle as needed for anaphylaxis. (Patient not taking: Reported on 05/04/2022)     ferrous fumarate (HEMOCYTE - 106 MG FE) 325 (106 Fe) MG TABS tablet Take 1 tablet by mouth daily.     furosemide (LASIX) 20 MG tablet TAKE 1 TABLET BY MOUTH EVERY DAY AS NEEDED FOR A WEIGHT OVER 140LBS (Patient not taking: Reported on 05/04/2022) 30 tablet 1   gabapentin (NEURONTIN) 300 MG capsule TAKE 1 CAPSULE(300 MG) BY MOUTH FOUR TIMES DAILY 360 capsule 1   Immune Globulin, Human, (HIZENTRA) 4 GM/20ML SOLN  Inject 8 g into the skin once a week. Thursday's     levothyroxine (SYNTHROID) 100 MCG tablet TAKE 1 TABLET(100 MCG) BY MOUTH DAILY 90 tablet 0   lisinopril (ZESTRIL) 10 MG tablet Take 1 tablet (10 mg total) by mouth daily. 90 tablet 1   Magnesium Oxide (MAG-OX 400 PO) Take 400 mg by mouth 2 (two) times a week.     metoprolol tartrate (LOPRESSOR) 50 MG tablet Take 1 tablet (50 mg total) by mouth 2 (two) times daily. 180 tablet 3   omeprazole (PRILOSEC) 40 MG capsule TAKE 1 CAPSULE(40 MG) BY MOUTH DAILY (Patient taking differently: Take 40 mg by mouth daily.) 90 capsule 0   potassium chloride (KLOR-CON) 10 MEQ  tablet TAKE 1 TABLET(10 MEQ) BY MOUTH DAILY (Patient taking differently: Take 10 mEq by mouth daily.) 90 tablet 0   thiamine 100 MG tablet Take 1 tablet (100 mg total) by mouth daily. 90 tablet 1   No current facility-administered medications for this visit.    SURGICAL HISTORY:  Past Surgical History:  Procedure Laterality Date   BREAST SURGERY  07/30/01   biopsy x 3, bilateral   CATARACT EXTRACTION Bilateral    COLONOSCOPY     ESOPHAGOGASTRODUODENOSCOPY (EGD) WITH PROPOFOL N/A 12/10/2020   Procedure: ESOPHAGOGASTRODUODENOSCOPY (EGD) WITH PROPOFOL;  Surgeon: Ronnette Juniper, MD;  Location: [place];  Service: Gastroenterology;  Laterality: N/A;  NG Tube Insertion   MASS EXCISION Right 10/28/2012   Procedure: Removal of mass on neck       ;  Surgeon: Adin Hector, MD;  Location: [place];  Service: General;  Laterality: Right;   PARTIAL HYSTERECTOMY  04/29/2004   PERMANENT PACEMAKER INSERTION N/A 09/15/2011   Procedure: PERMANENT PACEMAKER INSERTION;  Surgeon: Sanda Klein, MD;  Location: [facility] CATH LAB;  Service: Cardiovascular;  Laterality: N/A;   PPM GENERATOR CHANGEOUT N/A 09/01/2021   Procedure: PPM GENERATOR CHANGEOUT;  Surgeon: Sanda Klein, MD;  Location: [place] CV LAB;  Service: Cardiovascular;  Laterality: N/A;   RADIOACTIVE SEED GUIDED EXCISIONAL BREAST BIOPSY Left 08/11/2017   Procedure: LEFT RADIOACTIVE SEED GUIDED EXCISIONAL BREAST BIOPSY ERAS PATHWAY;  Surgeon: Stark Klein, MD;  Location: [place];  Service: General;  Laterality: Left;   TONSILLECTOMY      REVIEW OF SYSTEMS:  A comprehensive review of systems was negative.   PHYSICAL EXAMINATION: General appearance: alert, cooperative, and no distress Head: Normocephalic, without obvious abnormality, atraumatic Neck: no adenopathy, no JVD, supple, symmetrical, trachea midline, and thyroid not enlarged, symmetric, no tenderness/mass/nodules Lymph nodes: Cervical, supraclavicular, and axillary nodes normal. Resp: clear to  auscultation bilaterally Back: symmetric, no curvature. ROM normal. No CVA tenderness. Cardio: regular rate and rhythm, S1, S2 normal, no murmur, click, rub or gallop GI: soft, non-tender; bowel sounds normal; no masses,  no organomegaly Extremities: extremities normal, atraumatic, no cyanosis or edema  ECOG PERFORMANCE STATUS: 1 - Symptomatic but completely ambulatory  Blood pressure (!) 149/92, pulse 89, temperature 98.2 F (36.8 C), temperature source Oral, resp. rate 17, weight 139 lb 4.8 oz (63.2 kg), SpO2 93 %.  LABORATORY DATA: Lab Results  Component Value Date   WBC 7.0 08/03/2022   HGB 15.8 (H) 08/03/2022   HCT 45.9 08/03/2022   MCV 97.5 08/03/2022   PLT 147 (L) 08/03/2022      Chemistry      Component Value Date/Time   NA 138 08/03/2022 0913   NA 143 04/15/2022 1412   NA 143 06/16/2017 0921   K 4.2 08/03/2022 0913  K 3.8 06/16/2017 0921   CL 104 08/03/2022 0913   CL 109 (H) 12/14/2012 1009   CO2 29 08/03/2022 0913   CO2 28 06/16/2017 0921   BUN 15 08/03/2022 0913   BUN 16 04/15/2022 1412   BUN 11.7 06/16/2017 0921   CREATININE 0.78 08/03/2022 0913   CREATININE 0.8 06/16/2017 0921      Component Value Date/Time   CALCIUM 9.9 08/03/2022 0913   CALCIUM 9.7 06/16/2017 0921   ALKPHOS 59 08/03/2022 0913   ALKPHOS 63 06/16/2017 0921   AST 30 08/03/2022 0913   AST 23 06/16/2017 0921   ALT 17 08/03/2022 0913   ALT 18 06/16/2017 0921   BILITOT 0.3 08/03/2022 0913   BILITOT 0.52 06/16/2017 0921       RADIOGRAPHIC STUDIES: CT Chest W Contrast  Result Date: 08/04/2022 CLINICAL DATA:  Follicular lymphoma; * Tracking Code: BO * EXAM: CT CHEST, ABDOMEN, AND PELVIS WITH CONTRAST TECHNIQUE: Multidetector CT imaging of the chest, abdomen and pelvis was performed following the standard protocol during bolus administration of intravenous contrast. RADIATION DOSE REDUCTION: This exam was performed according to the departmental dose-optimization program which includes  automated exposure control, adjustment of the mA and/or kV according to patient size and/or use of iterative reconstruction technique. CONTRAST:  54m OMNIPAQUE IOHEXOL 300 MG/ML  SOLN COMPARISON:  CT chest, abdomen and pelvis dated August 12, 2021 FINDINGS: CT CHEST FINDINGS Cardiovascular: Cardiomegaly. Trace pericardial effusion. Normal caliber thoracic aorta with mild atherosclerotic disease. Left chest wall dual lead pacer with leads in the right atrium and right ventricle. Mediastinum/Nodes: Large hiatal hernia containing the stomach and a portion of the transverse colon. No pathologically enlarged lymph nodes seen in the chest. Lungs/Pleura: Central airways patent. No consolidation, pleural effusion or pneumothorax. Stable small bilateral solid calcified pulmonary nodules, considered benign. Reference nodule of the right lower lobe measuring 5 mm on series 4, image 71. Reference nodule of the left upper lobe measuring 5 mm on image 41. Stable trace left pleural effusion. Musculoskeletal: No chest wall mass or suspicious bone lesions identified. CT ABDOMEN PELVIS FINDINGS Hepatobiliary: No focal liver abnormality is seen. No gallstones, gallbladder wall thickening, or biliary dilatation. Pancreas: Unremarkable. No pancreatic ductal dilatation or surrounding inflammatory changes. Spleen: Normal in size without focal abnormality. Adrenals/Urinary Tract: Bilateral adrenal glands are unremarkable. No hydronephrosis or nephrolithiasis bladder is unremarkable. Stomach/Bowel: Stomach is within normal limits. Appendix appears normal. Diverticulosis. No evidence of bowel wall thickening, distention, or inflammatory changes. Vascular/Lymphatic: Aortic atherosclerosis. No enlarged abdominal or pelvic lymph nodes. Reproductive: No adnexal mass. Other: No abdominal wall hernia or abnormality. No abdominopelvic ascites. Musculoskeletal: Severe scoliosis and degenerative disc disease. No aggressive appearing osseous  lesions. IMPRESSION: 1. No lymphadenopathy in the chest, abdomen or pelvis. 2. No splenomegaly. 3. Large hiatal hernia containing the stomach and a portion of the transverse colon. 4. Stable small bilateral solid pulmonary nodules, considered benign. 5. Stable trace left pleural effusion. 6. Aortic Atherosclerosis (ICD10-I70.0). Electronically Signed   By: LYetta GlassmanM.D.   On: 08/04/2022 08:29   CT Abdomen Pelvis W Contrast  Result Date: 08/04/2022 CLINICAL DATA:  Follicular lymphoma; * Tracking Code: BO * EXAM: CT CHEST, ABDOMEN, AND PELVIS WITH CONTRAST TECHNIQUE: Multidetector CT imaging of the chest, abdomen and pelvis was performed following the standard protocol during bolus administration of intravenous contrast. RADIATION DOSE REDUCTION: This exam was performed according to the departmental dose-optimization program which includes automated exposure control, adjustment of the mA and/or kV according to patient  size and/or use of iterative reconstruction technique. CONTRAST:  44m OMNIPAQUE IOHEXOL 300 MG/ML  SOLN COMPARISON:  CT chest, abdomen and pelvis dated August 12, 2021 FINDINGS: CT CHEST FINDINGS Cardiovascular: Cardiomegaly. Trace pericardial effusion. Normal caliber thoracic aorta with mild atherosclerotic disease. Left chest wall dual lead pacer with leads in the right atrium and right ventricle. Mediastinum/Nodes: Large hiatal hernia containing the stomach and a portion of the transverse colon. No pathologically enlarged lymph nodes seen in the chest. Lungs/Pleura: Central airways patent. No consolidation, pleural effusion or pneumothorax. Stable small bilateral solid calcified pulmonary nodules, considered benign. Reference nodule of the right lower lobe measuring 5 mm on series 4, image 71. Reference nodule of the left upper lobe measuring 5 mm on image 41. Stable trace left pleural effusion. Musculoskeletal: No chest wall mass or suspicious bone lesions identified. CT ABDOMEN PELVIS  FINDINGS Hepatobiliary: No focal liver abnormality is seen. No gallstones, gallbladder wall thickening, or biliary dilatation. Pancreas: Unremarkable. No pancreatic ductal dilatation or surrounding inflammatory changes. Spleen: Normal in size without focal abnormality. Adrenals/Urinary Tract: Bilateral adrenal glands are unremarkable. No hydronephrosis or nephrolithiasis bladder is unremarkable. Stomach/Bowel: Stomach is within normal limits. Appendix appears normal. Diverticulosis. No evidence of bowel wall thickening, distention, or inflammatory changes. Vascular/Lymphatic: Aortic atherosclerosis. No enlarged abdominal or pelvic lymph nodes. Reproductive: No adnexal mass. Other: No abdominal wall hernia or abnormality. No abdominopelvic ascites. Musculoskeletal: Severe scoliosis and degenerative disc disease. No aggressive appearing osseous lesions. IMPRESSION: 1. No lymphadenopathy in the chest, abdomen or pelvis. 2. No splenomegaly. 3. Large hiatal hernia containing the stomach and a portion of the transverse colon. 4. Stable small bilateral solid pulmonary nodules, considered benign. 5. Stable trace left pleural effusion. 6. Aortic Atherosclerosis (ICD10-I70.0). Electronically Signed   By: LYetta GlassmanM.D.   On: 08/04/2022 08:29     ASSESSMENT AND PLAN:  This is a very pleasant 88years old white female with recurrent non-Hodgkin lymphoma, follicular center cell type status post treatment with Rituxan as well as Afinitor at [facility] and also completed a course of maintenance Rituxan for 12 cycles last dose was given 10/01/2014. The patient is currently on observation and she is feeling fine with no concerning complaints. She had repeat blood work as well as CT scan of the chest, abdomen and pelvis that showed no concerning findings for disease recurrence or progression. I recommended for her to continue on observation with repeat CT scan of the chest, abdomen and pelvis in 1 year. She was  advised to call immediately if she has any other concerning symptoms in the interval. The patient voices understanding of current disease status and treatment options and is in agreement with the current care plan. All questions were answered. The patient knows to call the clinic with any problems, questions or concerns. We can certainly see the patient much sooner if necessary.   Disclaimer: This note was dictated with voice recognition software. Similar sounding words can inadvertently be transcribed and may not be corrected upon review.

## 2022-08-07 DIAGNOSIS — D839 Common variable immunodeficiency, unspecified: Secondary | ICD-10-CM | POA: Diagnosis not present

## 2022-08-14 DIAGNOSIS — D839 Common variable immunodeficiency, unspecified: Secondary | ICD-10-CM | POA: Diagnosis not present

## 2022-08-20 ENCOUNTER — Other Ambulatory Visit: Payer: Self-pay | Admitting: Cardiovascular Disease

## 2022-08-21 DIAGNOSIS — D839 Common variable immunodeficiency, unspecified: Secondary | ICD-10-CM | POA: Diagnosis not present

## 2022-08-24 ENCOUNTER — Encounter: Payer: Self-pay | Admitting: Cardiovascular Disease

## 2022-08-24 ENCOUNTER — Ambulatory Visit: Payer: Medicare Other | Attending: Cardiovascular Disease | Admitting: Cardiovascular Disease

## 2022-08-24 VITALS — BP 120/84 | HR 77 | Ht 63.0 in | Wt 137.6 lb

## 2022-08-24 DIAGNOSIS — I10 Essential (primary) hypertension: Secondary | ICD-10-CM

## 2022-08-24 DIAGNOSIS — Z8679 Personal history of other diseases of the circulatory system: Secondary | ICD-10-CM

## 2022-08-24 DIAGNOSIS — Z950 Presence of cardiac pacemaker: Secondary | ICD-10-CM

## 2022-08-24 DIAGNOSIS — I480 Paroxysmal atrial fibrillation: Secondary | ICD-10-CM

## 2022-08-24 DIAGNOSIS — I495 Sick sinus syndrome: Secondary | ICD-10-CM | POA: Diagnosis not present

## 2022-08-24 DIAGNOSIS — I5032 Chronic diastolic (congestive) heart failure: Secondary | ICD-10-CM | POA: Diagnosis not present

## 2022-08-24 NOTE — Progress Notes (Signed)
Cardiology Office Note    Date:  08/30/2022   ID:  [name], [name] [birth year], MRN [id number]  PCP:  Janith Lima, MD  Cardiologist:   Sanda Klein, MD   Chief Complaint  Patient presents with   Dizziness   Pacemaker Check    History of Present Illness:  [name] is a 88 y.o. 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    For the last couple of years we have had some difficulty balancing the issues related to chronic diastolic heart failure with her orthostatic hypotension.  She has episodes of dizziness that occur on an intermittent basis.  They always occur when she is standing up in a pattern suggestive of orthostatic hypotension, but are not consistently present.  She tell me today that she takes her amlodipine 2.5 mg only if her blood pressure exceeds 140/90 mmHg in the morning.  This happens roughly 3 times a week.  She has not had a lot of lower extremity edema, only a little bit intermittently.  She does not have orthopnea or PND.  Orthostatic vital signs checked in the office today were stable without meaningful change.  Has not had chest pain, orthopnea, PND, palpitations, full syncope, claudication or new focal neurological complaints.  She underwent RF ablation in the lumbar spine area for chronic pain.  She has significant lumbar spine scoliosis.  Last echocardiogram in June 2022 showed normal left ventricular systolic function but does show evidence of significant diastolic dysfunction with pseudonormal mitral inflow and elevated left heart filling pressures.  She continues to self administer Hizentra subcutaneous injections weekly.  In office pacemaker check today shows normal device  function.  Estimated generator longevity is 12.6 years.  She has 88% atrial pacing and only 0.1% ventricular pacing.  For the first time in a long time she has had some atrial fibrillation, but the episode was only 46 seconds long on August 06, 2022.  She previously had paroxysmal atrial fibrillation immediately after implantation of her first pacemaker in 2013, probably due to lead microperforation related pericarditis.  She is not on anticoagulants.  Presenting rhythm today is atrial paced, ventricular sensed with a slightly long AV delay  Shortly after initial pacemaker implantation in 2013, she had problems with paroxysmal atrial fibrillation likely related to microperforation and acute pericarditis.  For many years after she has not had recurrent atrial fibrillation. She did have some shortness of breath in 2018 that responded promptly to diuretics. September 2019, she had a single syncopal events shortly after arriving in [place] after a long airline trip. Echo showed normal left ventricular systolic function but evidence of diastolic dysfunction and increased filling pressure.  She developed problems with hypervolemia while receiving IVIG (Hizentra) infusions, improved when this was switched to the low volume subcutaneous injections.  Periodically she has episodes of hypotension where we have to discontinue all of her antihypertensive medications, but after a few weeks gradually returns to have markedly elevated blood pressure and we restarted her medications.   Past Medical History:  Diagnosis Date   Anemia    pt denies   Arthritis    Atrial fibrillation (HCC)    Cataract    bil   Common variable immunodeficiency (HCC)    Cystocele    history with rectocele, pt denies   GERD (gastroesophageal reflux  disease)    H/O hiatal hernia    Hypertension    Hypothyroidism    Non Hodgkin's lymphoma ([place])    dx 2012 and now in remission   Osteoporosis    Pacemaker 09/15/2011   Medtronic Revo    Pericarditis    Pleural effusion, bilateral    history of   Pulmonary nodules    history of   Scoliosis    Varicose vein     Past Surgical History:  Procedure Laterality Date   BREAST SURGERY  07/30/01   biopsy x 3, bilateral   CATARACT EXTRACTION Bilateral    COLONOSCOPY     ESOPHAGOGASTRODUODENOSCOPY (EGD) WITH PROPOFOL N/A 12/10/2020   Procedure: ESOPHAGOGASTRODUODENOSCOPY (EGD) WITH PROPOFOL;  Surgeon: Ronnette Juniper, MD;  Location: [facility];  Service: Gastroenterology;  Laterality: N/A;  NG Tube Insertion   MASS EXCISION Right 10/28/2012   Procedure: Removal of mass on neck       ;  Surgeon: Adin Hector, MD;  Location: [facility];  Service: General;  Laterality: Right;   PARTIAL HYSTERECTOMY  04/29/2004   PERMANENT PACEMAKER INSERTION N/A 09/15/2011   Procedure: PERMANENT PACEMAKER INSERTION;  Surgeon: Sanda Klein, MD;  Location: [facility];  Service: Cardiovascular;  Laterality: N/A;   PPM GENERATOR CHANGEOUT N/A 09/01/2021   Procedure: PPM GENERATOR CHANGEOUT;  Surgeon: Sanda Klein, MD;  Location: [facility];  Service: Cardiovascular;  Laterality: N/A;   RADIOACTIVE SEED GUIDED EXCISIONAL BREAST BIOPSY Left 08/11/2017   Procedure: LEFT RADIOACTIVE SEED GUIDED EXCISIONAL BREAST BIOPSY ERAS PATHWAY;  Surgeon: Stark Klein, MD;  Location: [facility];  Service: General;  Laterality: Left;   TONSILLECTOMY      Current Medications: Outpatient Medications Prior to Visit  Medication Sig Dispense Refill   acetaminophen (TYLENOL) 500 MG tablet Take 500 mg by mouth every 6 (six) hours as needed (for pain.).      amitriptyline (ELAVIL) 10 MG tablet Take 10 mg by mouth at bedtime.     amLODipine (NORVASC) 5 MG tablet Take 1/2 tablet ( 2.5 mg ) daily 180 tablet 3   atorvastatin (LIPITOR) 40 MG tablet Take 40 mg by mouth daily in the afternoon.     Calcium Carbonate-Vitamin D 600-400 MG-UNIT tablet Take 1 tablet by mouth daily.     dexlansoprazole (DEXILANT) 60 MG capsule Take 60 mg  by mouth daily.     ferrous fumarate (HEMOCYTE - 106 MG FE) 325 (106 Fe) MG TABS tablet Take 1 tablet by mouth daily.     gabapentin (NEURONTIN) 300 MG capsule TAKE 1 CAPSULE(300 MG) BY MOUTH FOUR TIMES DAILY 360 capsule 1   Immune Globulin, Human, (HIZENTRA) 4 GM/20ML SOLN Inject 8 g into the skin once a week. Thursday's     JARDIANCE 10 MG TABS tablet TAKE 1 TABLET(10 MG) BY MOUTH DAILY BEFORE BREAKFAST 90 tablet 1   levothyroxine (SYNTHROID) 100 MCG tablet TAKE 1 TABLET(100 MCG) BY MOUTH DAILY 90 tablet 0   lisinopril (ZESTRIL) 10 MG tablet Take 1 tablet (10 mg total) by mouth daily. 90 tablet 1   Magnesium Oxide (MAG-OX 400 PO) Take 400 mg by mouth 2 (two) times a week.     metoprolol tartrate (LOPRESSOR) 50 MG tablet Take 1 tablet (50 mg total) by mouth 2 (two) times daily. 180 tablet 3   omeprazole (PRILOSEC) 40 MG capsule TAKE 1 CAPSULE(40 MG) BY MOUTH DAILY (Patient taking differently: Take 40 mg by mouth daily.) 90 capsule 0   potassium chloride (KLOR-CON)  10 MEQ tablet TAKE 1 TABLET(10 MEQ) BY MOUTH DAILY (Patient taking differently: Take 10 mEq by mouth daily.) 90 tablet 0   thiamine 100 MG tablet Take 1 tablet (100 mg total) by mouth daily. 90 tablet 1   EPINEPHrine 0.3 mg/0.3 mL IJ SOAJ injection Inject 0.3 mg into the muscle as needed for anaphylaxis. (Patient not taking: Reported on 08/24/2022)     furosemide (LASIX) 20 MG tablet TAKE 1 TABLET BY MOUTH EVERY DAY AS NEEDED FOR A WEIGHT OVER 140LBS (Patient not taking: Reported on 08/24/2022) 30 tablet 1   No facility-administered medications prior to visit.     Allergies:   Tramadol, Amoxicillin, Buprenorphine, Codeine, Demerol, Dextromethorphan-guaifenesin, Morphine and related, Sulfonamide derivatives, and Vicodin [hydrocodone-acetaminophen]   Social History   Socioeconomic History   Marital status: Widowed    Spouse name: Not on file   Number of children: 3   Years of education: Not on file   Highest education level: Not on  file  Occupational History   Occupation: [employer]  Tobacco Use   Smoking status: Never   Smokeless tobacco: Never  Vaping Use   Vaping Use: Never used  Substance and Sexual Activity   Alcohol use: Yes    Comment: 1 glass of wine occasionally   Drug use: No   Sexual activity: Not on file  Other Topics Concern   Not on file  Social History Narrative   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  She lives alone in a 3-story home.  There is a lift chair which she does not use (placed for her husband).      Right handed    Social Determinants of Health   Financial Resource Strain: Not on file  Food Insecurity: Not on file  Transportation Needs: Not on file  Physical Activity: Not on file  Stress: Not on file  Social Connections: Not on file     Family History:  The patient's family history includes Breast cancer in her sister; Heart disease in her brother and daughter; Lymphoma in her daughter; Stroke in her father and mother.   ROS:   Please see the history of present illness.    ROS All other systems reviewed and are negative.   PHYSICAL EXAM:   VS:  BP 120/84 (BP Location: Left Arm, Patient Position: Sitting, Cuff Size: Normal)   Pulse 77   Ht '5\' 3"'$  (1.6 m)   Wt 137 lb 9.6 oz (62.4 kg)   SpO2 93%   BMI 24.37 kg/m    Repeat blood pressure check Sitting 110/72, standing 120/74, standing 3 minutes 130/86 mmHg.   General: Alert, oriented x3, no distress, appears younger than stated age.  Healthy left subclavian pacemaker site. Head: no evidence of trauma, PERRL, EOMI, no exophtalmos or lid lag, no myxedema, no xanthelasma; normal ears, nose and oropharynx Neck: normal jugular venous pulsations and no hepatojugular reflux; brisk carotid pulses without delay and no carotid bruits Chest: clear to auscultation, no signs of consolidation by percussion or palpation, normal fremitus, symmetrical and full respiratory  excursions Cardiovascular: normal position and quality of the apical impulse, regular rhythm, normal first and second heart sounds, no murmurs, rubs or gallops Abdomen: no tenderness or distention, no masses by palpation, no abnormal pulsatility or arterial bruits, normal bowel sounds, no hepatosplenomegaly Extremities: no clubbing, cyanosis or edema; 2+ radial, ulnar and brachial pulses bilaterally; 2+ right femoral, posterior tibial and dorsalis pedis pulses; 2+ left femoral, posterior  tibial and dorsalis pedis pulses; no subclavian or femoral bruits Neurological: grossly nonfocal Psych: Normal mood and affect     Wt Readings from Last 3 Encounters:  08/24/22 137 lb 9.6 oz (62.4 kg)  08/06/22 139 lb 4.8 oz (63.2 kg)  05/04/22 136 lb 6.4 oz (61.9 kg)      Studies/Labs Reviewed:   EKG:  EKG is not ordered today.  Personally reviewed her most recent electrocardiogram which shows atrial paced ventricular sensed rhythm with mildly prolonged AV delay 218 ms, otherwise normal tracing.  Intracardiac electrogram shows atrial paced, ventricular sensed rhythm.  recent Labs: 04/15/2022: BNP 158.4 08/03/2022: ALT 17; BUN 15; Creatinine 0.78; Hemoglobin 15.8; Platelet Count 147; Potassium 4.2; Sodium 138  Lipid Panel     Component Value Date/Time   CHOL 182 06/03/2021 1054   CHOL 144 11/17/2018 1129   TRIG 191.0 (H) 06/03/2021 1054   HDL 47.40 06/03/2021 1054   HDL 41 11/17/2018 1129   CHOLHDL 4 06/03/2021 1054   VLDL 38.2 06/03/2021 1054   LDLCALC 96 06/03/2021 1054   LDLCALC 74 11/17/2018 1129   LABVLDL 29 11/17/2018 1129     ASSESSMENT:    1. Chronic diastolic heart failure ([place])   2. SSS (sick sinus syndrome) ([place])   3. Essential hypertension   4. Pacemaker   5. Paroxysmal atrial fibrillation (HCC)   6. History of pericarditis       PLAN:  In order of problems listed above:  CHF: Appears euvolemic today and she is asymptomatic from this point of view.  She had evidence of  hypervolemia at a weight of 140 pounds.  Currently seems to be doing within our target "dry weight" range of 135-137 pounds per our office scale.  Echo in 2022 showed findings of pseudo normal mitral inflow pattern.  She has not required any loop diuretic dosing in months as prescribed only for weight over 140 pounds).  She takes [place].  Unfortunately she seems to have a rather tight margin between hypovolemia and symptomatic hypervolemia.  Interestingly, her unexplained episodes of hypotension do not appear to correlate with her volume status.  No evidence of constrictive physiology on echo in 2018 or 2022. She has not received chest radiation therapy for lymphoma, just chemotherapy.  Consider repeat echo or even cardiac MRI if signs of heart failure worsen, to look for signs of constriction.  HTN: She continues to have symptoms strongly suggestive of orthostatic hypotension, although we were not able to reproduce this in clinic today.  I wonder if there is a correlation between her intermittent doses of amlodipine.  Asked her to keep a log of when she gives the medication and when she has dizziness.  Also asked her not to take amlodipine unless her blood pressure is over 150/90 rather than 140/90.  SSS: Virtually always in atrial paced, ventricular sensed rhythm with a good heart rate histogram distribution. PM: Recent generator change out.  Continue remote downloads every 3 months.[name] PAT/PAF: For the first time since her generator change out she had a very brief episode of paroxysmal atrial fibrillation lasting for less than a minute.  Will keep an eye on this but at this point I would not recommend anticoagulation. Outside of the initial period of post pacemaker implantation pericarditis when she had atrial fibrillation she has had extremely brief bursts of atrial fib, maximum 90 seconds.   History of post procedural acute pericarditis after pacemaker implantation, resolved. Had atrial fibrillation  related to acute pericarditis, which has  not recurred in significant amounts over the ensuing years.   Medication Adjustments/Labs and Tests Ordered: Current medicines are reviewed at length with the patient today.  Concerns regarding medicines are outlined above.  Medication changes, Labs and Tests ordered today are listed in the Patient Instructions below. Patient Instructions  Medication Instructions:  No changes *If you need a refill on your cardiac medications before your next appointment, please call your pharmacy*  Follow-Up: At [facility], you and your health needs are our priority.  As part of our continuing mission to provide you with exceptional heart care, we have created designated Provider Care Teams.  These Care Teams include your primary Cardiologist (physician) and Advanced Practice Providers (APPs -  Physician Assistants and Nurse Practitioners) who all work together to provide you with the care you need, when you need it.  We recommend signing up for the patient portal called "MyChart".  Sign up information is provided on this After Visit Summary.  MyChart is used to connect with patients for Virtual Visits (Telemedicine).  Patients are able to view lab/test results, encounter notes, upcoming appointments, etc.  Non-urgent messages can be sent to your provider as well.   To learn more about what you can do with MyChart, go to [email or web address].    Your next appointment:   6 month(s) Device  Provider:   Sanda Klein, MD         Signed, Sanda Klein, MD  08/30/2022 2:08 PM    [facility] [address], [place], [place]  [address] Phone: [phone]; Fax: [phone]

## 2022-08-24 NOTE — Patient Instructions (Addendum)
Medication Instructions:  No changes *If you need a refill on your cardiac medications before your next appointment, please call your pharmacy*  Follow-Up: At [facility], you and your health needs are our priority.  As part of our continuing mission to provide you with exceptional heart care, we have created designated Provider Care Teams.  These Care Teams include your primary Cardiologist (physician) and Advanced Practice Providers (APPs -  Physician Assistants and Nurse Practitioners) who all work together to provide you with the care you need, when you need it.  We recommend signing up for the patient portal called "MyChart".  Sign up information is provided on this After Visit Summary.  MyChart is used to connect with patients for Virtual Visits (Telemedicine).  Patients are able to view lab/test results, encounter notes, upcoming appointments, etc.  Non-urgent messages can be sent to your provider as well.   To learn more about what you can do with MyChart, go to [email or web address].    Your next appointment:   6 month(s) Device  Provider:   Sanda Klein, MD

## 2022-08-28 DIAGNOSIS — D839 Common variable immunodeficiency, unspecified: Secondary | ICD-10-CM | POA: Diagnosis not present

## 2022-08-28 DIAGNOSIS — K449 Diaphragmatic hernia without obstruction or gangrene: Secondary | ICD-10-CM | POA: Diagnosis not present

## 2022-08-28 DIAGNOSIS — R1319 Other dysphagia: Secondary | ICD-10-CM | POA: Diagnosis not present

## 2022-08-28 DIAGNOSIS — Z8572 Personal history of non-Hodgkin lymphomas: Secondary | ICD-10-CM | POA: Diagnosis not present

## 2022-08-28 DIAGNOSIS — K219 Gastro-esophageal reflux disease without esophagitis: Secondary | ICD-10-CM | POA: Diagnosis not present

## 2022-08-28 DIAGNOSIS — I495 Sick sinus syndrome: Secondary | ICD-10-CM | POA: Diagnosis not present

## 2022-09-01 ENCOUNTER — Ambulatory Visit (INDEPENDENT_AMBULATORY_CARE_PROVIDER_SITE_OTHER): Payer: Medicare Other

## 2022-09-01 DIAGNOSIS — I495 Sick sinus syndrome: Secondary | ICD-10-CM

## 2022-09-02 LAB — CUP PACEART REMOTE DEVICE CHECK
Battery Remaining Longevity: 151 mo
Battery Voltage: 3.08 V
Brady Statistic AP VP Percent: 0.03 %
Brady Statistic AP VS Percent: 77.53 %
Brady Statistic AS VP Percent: 0.01 %
Brady Statistic AS VS Percent: 22.43 %
Brady Statistic RA Percent Paced: 77.54 %
Brady Statistic RV Percent Paced: 0.04 %
Date Time Interrogation Session: 20240305043455
Implantable Lead Connection Status: 753985
Implantable Lead Connection Status: 753985
Implantable Lead Implant Date: 20130319
Implantable Lead Implant Date: 20130319
Implantable Lead Location: 753859
Implantable Lead Location: 753860
Implantable Pulse Generator Implant Date: 20230306
Lead Channel Impedance Value: 342 Ohm
Lead Channel Impedance Value: 399 Ohm
Lead Channel Impedance Value: 399 Ohm
Lead Channel Impedance Value: 437 Ohm
Lead Channel Pacing Threshold Amplitude: 0.625 V
Lead Channel Pacing Threshold Amplitude: 0.875 V
Lead Channel Pacing Threshold Pulse Width: 0.4 ms
Lead Channel Pacing Threshold Pulse Width: 0.4 ms
Lead Channel Sensing Intrinsic Amplitude: 1.25 mV
Lead Channel Sensing Intrinsic Amplitude: 1.25 mV
Lead Channel Sensing Intrinsic Amplitude: 18.625 mV
Lead Channel Sensing Intrinsic Amplitude: 18.625 mV
Lead Channel Setting Pacing Amplitude: 1.5 V
Lead Channel Setting Pacing Amplitude: 2 V
Lead Channel Setting Pacing Pulse Width: 0.4 ms
Lead Channel Setting Sensing Sensitivity: 0.9 mV
Zone Setting Status: 755011
Zone Setting Status: 755011

## 2022-09-04 DIAGNOSIS — D839 Common variable immunodeficiency, unspecified: Secondary | ICD-10-CM | POA: Diagnosis not present

## 2022-09-11 DIAGNOSIS — D839 Common variable immunodeficiency, unspecified: Secondary | ICD-10-CM | POA: Diagnosis not present

## 2022-09-18 DIAGNOSIS — D839 Common variable immunodeficiency, unspecified: Secondary | ICD-10-CM | POA: Diagnosis not present

## 2022-09-25 DIAGNOSIS — D839 Common variable immunodeficiency, unspecified: Secondary | ICD-10-CM | POA: Diagnosis not present

## 2022-10-02 DIAGNOSIS — D839 Common variable immunodeficiency, unspecified: Secondary | ICD-10-CM | POA: Diagnosis not present

## 2022-10-07 ENCOUNTER — Telehealth: Payer: Self-pay | Admitting: Internal Medicine

## 2022-10-07 ENCOUNTER — Other Ambulatory Visit: Payer: Self-pay | Admitting: Internal Medicine

## 2022-10-07 DIAGNOSIS — E039 Hypothyroidism, unspecified: Secondary | ICD-10-CM

## 2022-10-07 NOTE — Telephone Encounter (Signed)
Prescription Request  10/07/2022  LOV: Visit date not found  What is the name of the medication or equipment? levothyroxine (SYNTHROID) 100 MCG tablet   Have you contacted your pharmacy to request a refill? No   Which pharmacy would you like this sent to?  [id number] [place], [place] - [address] AT [facility] [address] [place] [address] Phone: [phone] Fax: [phone]    Patient notified that their request is being sent to the clinical staff for review and that they should receive a response within 2 business days.   Please advise at Mobile [phone] (mobile)

## 2022-10-07 NOTE — Progress Notes (Signed)
Remote pacemaker transmission.   

## 2022-10-08 ENCOUNTER — Other Ambulatory Visit: Payer: Self-pay | Admitting: Internal Medicine

## 2022-10-08 ENCOUNTER — Ambulatory Visit: Payer: Medicare Other | Admitting: Internal Medicine

## 2022-10-08 DIAGNOSIS — E039 Hypothyroidism, unspecified: Secondary | ICD-10-CM

## 2022-10-09 ENCOUNTER — Telehealth: Payer: Self-pay | Admitting: Internal Medicine

## 2022-10-09 ENCOUNTER — Other Ambulatory Visit: Payer: Self-pay | Admitting: Internal Medicine

## 2022-10-09 DIAGNOSIS — E039 Hypothyroidism, unspecified: Secondary | ICD-10-CM

## 2022-10-09 DIAGNOSIS — D839 Common variable immunodeficiency, unspecified: Secondary | ICD-10-CM | POA: Diagnosis not present

## 2022-10-09 MED ORDER — LEVOTHYROXINE SODIUM 100 MCG PO TABS: ORAL_TABLET | ORAL | 0 refills | Status: DC

## 2022-10-09 NOTE — Telephone Encounter (Signed)
Pharmacy called wanting to know if the patient can get a partial refill on her medication  levothyroxine (SYNTHROID) 100 MCG tablet due to her not being to get an appt to see Dr. Yetta Barre until April 30th.  Best callback number for the patient is [phone].

## 2022-10-16 DIAGNOSIS — D839 Common variable immunodeficiency, unspecified: Secondary | ICD-10-CM | POA: Diagnosis not present

## 2022-10-23 DIAGNOSIS — D839 Common variable immunodeficiency, unspecified: Secondary | ICD-10-CM | POA: Diagnosis not present

## 2022-10-27 ENCOUNTER — Ambulatory Visit (INDEPENDENT_AMBULATORY_CARE_PROVIDER_SITE_OTHER): Payer: Medicare Other | Admitting: Internal Medicine

## 2022-10-27 ENCOUNTER — Encounter: Payer: Self-pay | Admitting: Internal Medicine

## 2022-10-27 VITALS — BP 116/70 | HR 86 | Temp 98.4°F | Ht 63.0 in | Wt 137.0 lb

## 2022-10-27 DIAGNOSIS — E519 Thiamine deficiency, unspecified: Secondary | ICD-10-CM

## 2022-10-27 DIAGNOSIS — Z0001 Encounter for general adult medical examination with abnormal findings: Secondary | ICD-10-CM

## 2022-10-27 DIAGNOSIS — D696 Thrombocytopenia, unspecified: Secondary | ICD-10-CM | POA: Diagnosis not present

## 2022-10-27 DIAGNOSIS — Z0000 Encounter for general adult medical examination without abnormal findings: Secondary | ICD-10-CM | POA: Diagnosis not present

## 2022-10-27 DIAGNOSIS — F0280 Dementia in other diseases classified elsewhere without behavioral disturbance: Secondary | ICD-10-CM | POA: Diagnosis not present

## 2022-10-27 DIAGNOSIS — M1712 Unilateral primary osteoarthritis, left knee: Secondary | ICD-10-CM | POA: Diagnosis not present

## 2022-10-27 DIAGNOSIS — E785 Hyperlipidemia, unspecified: Secondary | ICD-10-CM

## 2022-10-27 DIAGNOSIS — E039 Hypothyroidism, unspecified: Secondary | ICD-10-CM

## 2022-10-27 DIAGNOSIS — I10 Essential (primary) hypertension: Secondary | ICD-10-CM | POA: Diagnosis not present

## 2022-10-27 DIAGNOSIS — E042 Nontoxic multinodular goiter: Secondary | ICD-10-CM | POA: Diagnosis not present

## 2022-10-27 DIAGNOSIS — E440 Moderate protein-calorie malnutrition: Secondary | ICD-10-CM

## 2022-10-27 LAB — LDL CHOLESTEROL, DIRECT: Direct LDL: 125 mg/dL

## 2022-10-27 LAB — CBC WITH DIFFERENTIAL/PLATELET
Basophils Absolute: 0.1 10*3/uL (ref 0.0–0.1)
Basophils Relative: 1.2 % (ref 0.0–3.0)
Eosinophils Absolute: 0.2 10*3/uL (ref 0.0–0.7)
Eosinophils Relative: 2.8 % (ref 0.0–5.0)
HCT: 42.8 % (ref 36.0–46.0)
Hemoglobin: 14.1 g/dL (ref 12.0–15.0)
Lymphocytes Relative: 35.6 % (ref 12.0–46.0)
Lymphs Abs: 2.1 10*3/uL (ref 0.7–4.0)
MCHC: 33 g/dL (ref 30.0–36.0)
MCV: 97.8 fl (ref 78.0–100.0)
Monocytes Absolute: 0.5 10*3/uL (ref 0.1–1.0)
Monocytes Relative: 8 % (ref 3.0–12.0)
Neutro Abs: 3.1 10*3/uL (ref 1.4–7.7)
Neutrophils Relative %: 52.4 % (ref 43.0–77.0)
Platelets: 197 10*3/uL (ref 150.0–400.0)
RBC: 4.38 Mil/uL (ref 3.87–5.11)
RDW: 13.8 % (ref 11.5–15.5)
WBC: 5.9 10*3/uL (ref 4.0–10.5)

## 2022-10-27 LAB — LIPID PANEL
Cholesterol: 186 mg/dL (ref 0–200)
HDL: 39 mg/dL — ABNORMAL LOW (ref >39.00)
NonHDL: 147.37
Total CHOL/HDL Ratio: 5
Triglycerides: 217 mg/dL — ABNORMAL HIGH (ref 0.0–149.0)
VLDL: 43.4 mg/dL — ABNORMAL HIGH (ref 0.0–40.0)

## 2022-10-27 LAB — VITAMIN B12: Vitamin B-12: 1130 pg/mL — ABNORMAL HIGH (ref 211–911)

## 2022-10-27 LAB — FOLATE: Folate: >23.5 ng/mL (ref >5.9)

## 2022-10-27 LAB — TSH: TSH: 1.1 u[IU]/mL (ref 0.35–5.50)

## 2022-10-27 MED ORDER — THIAMINE HCL 100 MG PO TABS
100.0000 mg | ORAL_TABLET | Freq: Every day | ORAL | 1 refills | Status: DC
Start: 2022-10-27 — End: 2023-06-14

## 2022-10-27 MED ORDER — UNITHROID 100 MCG PO TABS
100.0000 ug | ORAL_TABLET | Freq: Every day | ORAL | 1 refills | Status: DC
Start: 2022-10-27 — End: 2023-02-22

## 2022-10-27 NOTE — Patient Instructions (Signed)

## 2022-10-27 NOTE — Progress Notes (Signed)
Subjective:  Patient ID: [name], female    DOB: 08-Jun-1935  Age: 88 y.o. MRN: [id number]  CC: Annual Exam, Hypothyroidism, Hyperlipidemia, and Hypertension   HPI [name] presents for a CPX and f/up ----  She has a baseline level of ataxia, shortness of breath, dyspnea on exertion.  She denies chest pain, palpitations, diaphoresis, or edema.  Outpatient Medications Prior to Visit  Medication Sig Dispense Refill   acetaminophen (TYLENOL) 500 MG tablet Take 500 mg by mouth every 6 (six) hours as needed (for pain.).      amitriptyline (ELAVIL) 10 MG tablet Take 10 mg by mouth at bedtime.     amLODipine (NORVASC) 5 MG tablet Take 1/2 tablet ( 2.5 mg ) daily 180 tablet 3   atorvastatin (LIPITOR) 40 MG tablet Take 40 mg by mouth daily in the afternoon.     Calcium Carbonate-Vitamin D 600-400 MG-UNIT tablet Take 1 tablet by mouth daily.     dexlansoprazole (DEXILANT) 60 MG capsule Take 60 mg by mouth daily.     EPINEPHrine 0.3 mg/0.3 mL IJ SOAJ injection Inject 0.3 mg into the muscle as needed for anaphylaxis.     ferrous fumarate (HEMOCYTE - 106 MG FE) 325 (106 Fe) MG TABS tablet Take 1 tablet by mouth daily.     furosemide (LASIX) 20 MG tablet TAKE 1 TABLET BY MOUTH EVERY DAY AS NEEDED FOR A WEIGHT OVER 140LBS 30 tablet 1   gabapentin (NEURONTIN) 300 MG capsule TAKE 1 CAPSULE(300 MG) BY MOUTH FOUR TIMES DAILY 360 capsule 1   Immune Globulin, Human, (HIZENTRA) 4 GM/20ML SOLN Inject 8 g into the skin once a week. Thursday's     JARDIANCE 10 MG TABS tablet TAKE 1 TABLET(10 MG) BY MOUTH DAILY BEFORE BREAKFAST 90 tablet 1   lisinopril (ZESTRIL) 10 MG tablet Take 1 tablet (10 mg total) by mouth daily. 90 tablet 1   Magnesium Oxide (MAG-OX 400 PO) Take 400 mg by mouth 2 (two) times a week.     metoprolol tartrate (LOPRESSOR) 50 MG tablet Take 1 tablet (50 mg total) by mouth 2 (two) times daily. 180 tablet 3   omeprazole (PRILOSEC) 40 MG capsule TAKE 1 CAPSULE(40 MG) BY MOUTH  DAILY (Patient taking differently: Take 40 mg by mouth daily.) 90 capsule 0   potassium chloride (KLOR-CON) 10 MEQ tablet TAKE 1 TABLET(10 MEQ) BY MOUTH DAILY (Patient taking differently: Take 10 mEq by mouth daily.) 90 tablet 0   levothyroxine (SYNTHROID) 100 MCG tablet TAKE 1 TABLET(100 MCG) BY MOUTH DAILY 30 tablet 0   thiamine 100 MG tablet Take 1 tablet (100 mg total) by mouth daily. 90 tablet 1   No facility-administered medications prior to visit.    ROS Review of Systems  Constitutional:  Negative for appetite change, chills, diaphoresis, fatigue and fever.  HENT: Negative.    Eyes: Negative.   Respiratory:  Positive for shortness of breath. Negative for cough, chest tightness and wheezing.   Cardiovascular:  Negative for chest pain, palpitations and leg swelling.  Gastrointestinal:  Negative for abdominal pain, constipation, diarrhea, nausea and vomiting.  Endocrine: Negative.   Genitourinary: Negative.  Negative for difficulty urinating.  Musculoskeletal:  Positive for arthralgias and gait problem. Negative for myalgias.  Skin: Negative.   Neurological:  Negative for dizziness, weakness, light-headedness, numbness and headaches.  Hematological:  Negative for adenopathy. Does not bruise/bleed easily.  Psychiatric/Behavioral:  Positive for confusion and decreased concentration. Negative for behavioral problems, dysphoric mood and suicidal ideas.  The patient is not nervous/anxious.     Objective:  BP 116/70 (BP Location: Right Arm, Patient Position: Sitting, Cuff Size: Large)   Pulse 86   Temp 98.4 F (36.9 C) (Oral)   Ht 5\' 3"  (1.6 m)   Wt 137 lb (62.1 kg)   SpO2 94%   BMI 24.27 kg/m   BP Readings from Last 3 Encounters:  10/27/22 116/70  08/24/22 120/84  08/06/22 (!) 149/92    Wt Readings from Last 3 Encounters:  10/27/22 137 lb (62.1 kg)  08/24/22 137 lb 9.6 oz (62.4 kg)  08/06/22 139 lb 4.8 oz (63.2 kg)    Physical Exam Vitals reviewed.  Constitutional:       Appearance: Normal appearance.  HENT:     Nose: Nose normal.     Mouth/Throat:     Mouth: Mucous membranes are moist.  Eyes:     General: No scleral icterus.    Conjunctiva/sclera: Conjunctivae normal.  Neck:     Thyroid: No thyroid mass, thyromegaly or thyroid tenderness.  Cardiovascular:     Rate and Rhythm: Normal rate and regular rhythm.     Heart sounds: No murmur heard.    No friction rub. No gallop.  Pulmonary:     Effort: Pulmonary effort is normal.     Breath sounds: No stridor. No wheezing, rhonchi or rales.  Abdominal:     General: Abdomen is flat.     Palpations: There is no mass.     Tenderness: There is no abdominal tenderness. There is no guarding.     Hernia: No hernia is present.  Musculoskeletal:        General: Normal range of motion.     Cervical back: Neck supple.     Right lower leg: No edema.     Left lower leg: No edema.  Lymphadenopathy:     Cervical: No cervical adenopathy.  Skin:    General: Skin is warm and dry.  Neurological:     General: No focal deficit present.     Mental Status: She is alert and oriented to person, place, and time. Mental status is at baseline.  Psychiatric:        Mood and Affect: Mood normal.        Behavior: Behavior normal.     Lab Results  Component Value Date   WBC 5.9 10/27/2022   HGB 14.1 10/27/2022   HCT 42.8 10/27/2022   PLT 197.0 10/27/2022   GLUCOSE 79 08/03/2022   CHOL 186 10/27/2022   TRIG 217.0 (H) 10/27/2022   HDL 39.00 (L) 10/27/2022   LDLDIRECT 125.0 10/27/2022   LDLCALC 96 06/03/2021   ALT 17 08/03/2022   AST 30 08/03/2022   NA 138 08/03/2022   K 4.2 08/03/2022   CL 104 08/03/2022   CREATININE 0.78 08/03/2022   BUN 15 08/03/2022   CO2 29 08/03/2022   TSH 1.10 10/27/2022    CT Chest W Contrast  Result Date: 08/04/2022 CLINICAL DATA:  Follicular lymphoma; * Tracking Code: BO * EXAM: CT CHEST, ABDOMEN, AND PELVIS WITH CONTRAST TECHNIQUE: Multidetector CT imaging of the chest, abdomen  and pelvis was performed following the standard protocol during bolus administration of intravenous contrast. RADIATION DOSE REDUCTION: This exam was performed according to the departmental dose-optimization program which includes automated exposure control, adjustment of the mA and/or kV according to patient size and/or use of iterative reconstruction technique. CONTRAST:  80mL OMNIPAQUE IOHEXOL 300 MG/ML  SOLN COMPARISON:  CT chest, abdomen and  pelvis dated August 12, 2021 FINDINGS: CT CHEST FINDINGS Cardiovascular: Cardiomegaly. Trace pericardial effusion. Normal caliber thoracic aorta with mild atherosclerotic disease. Left chest wall dual lead pacer with leads in the right atrium and right ventricle. Mediastinum/Nodes: Large hiatal hernia containing the stomach and a portion of the transverse colon. No pathologically enlarged lymph nodes seen in the chest. Lungs/Pleura: Central airways patent. No consolidation, pleural effusion or pneumothorax. Stable small bilateral solid calcified pulmonary nodules, considered benign. Reference nodule of the right lower lobe measuring 5 mm on series 4, image 71. Reference nodule of the left upper lobe measuring 5 mm on image 41. Stable trace left pleural effusion. Musculoskeletal: No chest wall mass or suspicious bone lesions identified. CT ABDOMEN PELVIS FINDINGS Hepatobiliary: No focal liver abnormality is seen. No gallstones, gallbladder wall thickening, or biliary dilatation. Pancreas: Unremarkable. No pancreatic ductal dilatation or surrounding inflammatory changes. Spleen: Normal in size without focal abnormality. Adrenals/Urinary Tract: Bilateral adrenal glands are unremarkable. No hydronephrosis or nephrolithiasis bladder is unremarkable. Stomach/Bowel: Stomach is within normal limits. Appendix appears normal. Diverticulosis. No evidence of bowel wall thickening, distention, or inflammatory changes. Vascular/Lymphatic: Aortic atherosclerosis. No enlarged abdominal  or pelvic lymph nodes. Reproductive: No adnexal mass. Other: No abdominal wall hernia or abnormality. No abdominopelvic ascites. Musculoskeletal: Severe scoliosis and degenerative disc disease. No aggressive appearing osseous lesions. IMPRESSION: 1. No lymphadenopathy in the chest, abdomen or pelvis. 2. No splenomegaly. 3. Large hiatal hernia containing the stomach and a portion of the transverse colon. 4. Stable small bilateral solid pulmonary nodules, considered benign. 5. Stable trace left pleural effusion. 6. Aortic Atherosclerosis (ICD10-I70.0). Electronically Signed   By: Allegra Lai M.D.   On: 08/04/2022 08:29   CT Abdomen Pelvis W Contrast  Result Date: 08/04/2022 CLINICAL DATA:  Follicular lymphoma; * Tracking Code: BO * EXAM: CT CHEST, ABDOMEN, AND PELVIS WITH CONTRAST TECHNIQUE: Multidetector CT imaging of the chest, abdomen and pelvis was performed following the standard protocol during bolus administration of intravenous contrast. RADIATION DOSE REDUCTION: This exam was performed according to the departmental dose-optimization program which includes automated exposure control, adjustment of the mA and/or kV according to patient size and/or use of iterative reconstruction technique. CONTRAST:  80mL OMNIPAQUE IOHEXOL 300 MG/ML  SOLN COMPARISON:  CT chest, abdomen and pelvis dated August 12, 2021 FINDINGS: CT CHEST FINDINGS Cardiovascular: Cardiomegaly. Trace pericardial effusion. Normal caliber thoracic aorta with mild atherosclerotic disease. Left chest wall dual lead pacer with leads in the right atrium and right ventricle. Mediastinum/Nodes: Large hiatal hernia containing the stomach and a portion of the transverse colon. No pathologically enlarged lymph nodes seen in the chest. Lungs/Pleura: Central airways patent. No consolidation, pleural effusion or pneumothorax. Stable small bilateral solid calcified pulmonary nodules, considered benign. Reference nodule of the right lower lobe measuring  5 mm on series 4, image 71. Reference nodule of the left upper lobe measuring 5 mm on image 41. Stable trace left pleural effusion. Musculoskeletal: No chest wall mass or suspicious bone lesions identified. CT ABDOMEN PELVIS FINDINGS Hepatobiliary: No focal liver abnormality is seen. No gallstones, gallbladder wall thickening, or biliary dilatation. Pancreas: Unremarkable. No pancreatic ductal dilatation or surrounding inflammatory changes. Spleen: Normal in size without focal abnormality. Adrenals/Urinary Tract: Bilateral adrenal glands are unremarkable. No hydronephrosis or nephrolithiasis bladder is unremarkable. Stomach/Bowel: Stomach is within normal limits. Appendix appears normal. Diverticulosis. No evidence of bowel wall thickening, distention, or inflammatory changes. Vascular/Lymphatic: Aortic atherosclerosis. No enlarged abdominal or pelvic lymph nodes. Reproductive: No adnexal mass. Other: No abdominal wall  hernia or abnormality. No abdominopelvic ascites. Musculoskeletal: Severe scoliosis and degenerative disc disease. No aggressive appearing osseous lesions. IMPRESSION: 1. No lymphadenopathy in the chest, abdomen or pelvis. 2. No splenomegaly. 3. Large hiatal hernia containing the stomach and a portion of the transverse colon. 4. Stable small bilateral solid pulmonary nodules, considered benign. 5. Stable trace left pleural effusion. 6. Aortic Atherosclerosis (ICD10-I70.0). Electronically Signed   By: Allegra Lai M.D.   On: 08/04/2022 08:29    Assessment & Plan:   Dyslipidemia, LDL 136- She has been told not to take a statin. -     Lipid panel; Future -     TSH; Future  Encounter for general adult medical examination with abnormal findings- Exam completed, labs reviewed, vaccines reviewed, no cancer screenings indicated, patient education was given.  Essential hypertension- Her blood pressure is well-controlled.  Multinodular goiter- Examination of the thyroid gland is normal. -      TSH; Future  Primary osteoarthritis of left knee  Hypothyroidism, unspecified type- She is euthyroid. -     TSH; Future -     Unithroid; Take 1 tablet (100 mcg total) by mouth daily before breakfast.  Dispense: 90 tablet; Refill: 1  Thrombocytopenia (HCC)- Her platelets are normal now. 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  Dispense: 90 tablet; Refill: 1 -     CBC with Differential/Platelet; Future  Other orders -     LDL cholesterol, direct     Follow-up: Return in about 6 months (around 04/28/2023).  Sanda Linger, MD

## 2022-10-30 ENCOUNTER — Encounter: Payer: Self-pay | Admitting: Internal Medicine

## 2022-10-30 DIAGNOSIS — D839 Common variable immunodeficiency, unspecified: Secondary | ICD-10-CM | POA: Diagnosis not present

## 2022-11-03 ENCOUNTER — Telehealth: Payer: Self-pay

## 2022-11-03 NOTE — Telephone Encounter (Signed)
Pt stated that she is not taking B12.

## 2022-11-03 NOTE — Telephone Encounter (Signed)
Is she taking/using B12?

## 2022-11-03 NOTE — Telephone Encounter (Signed)
Pt states she is having headaches and an irregular heart beat feeling in her chest like it pounding a/w fatigue. I offered opt an apptmnt to be seen and pt has stated "she does not want to come in for no reason."  ** Pt states she received a copy of her labs in the mail but was never notified on what to do. Pt states she seen her B12 levels were elevated and wants to know if this has anything to do with her sxs above a/w what to do for the elevated B12?  Please call the pt at [phone]

## 2022-11-04 NOTE — Telephone Encounter (Signed)
Will recheck B12 next visit

## 2022-11-04 NOTE — Telephone Encounter (Signed)
She needs to be evaluated for cancer

## 2022-11-04 NOTE — Telephone Encounter (Signed)
Pt's daughter Melody stated she wanted a call directly from the physician. 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 Pt's daughter proceed to raise her voice stating "this is the second time that my mother has had elevated B12 levels" and "nothing is being done". She also hollered that "he knew 2 years ago when this happened she was not taking supplements then". During a brief pause of her rant I inform her that was the nature on my call but I would have a member of management to reach out to her as physicians do not typically make result calls personally. She then raised her voice again saying "so your not going to tell me what you called for then?" I reiterated to Melody that I would have a member of management reach out to her.

## 2022-11-04 NOTE — Telephone Encounter (Signed)
Called patient to discuss suggestion from Dr. Yetta Barre. Let patient know that best to have CT of Chest and Abdomen evaluation and schedule an appointmet to discuss next step.   Patient stated she had a CT in Feb.. Please see CT Notest on 08/04/2022 for Chest and Abd.   Informed patient will make provider aware of images from Feb. And we schedule her an appointment with Dr. Yetta Barre to discuss on 5/22 @ 9:40a. Let her know that we will call her back if there need to be adjustments before her next appointment.  Patient agrees with plan, no additional questions at this time.

## 2022-11-04 NOTE — Telephone Encounter (Signed)
Pts daughter asked that PCP or nurse give her a call about her mother labs at [phone].  *Daughter would not give any info on what she wanted to discuss just wanted PCP or nurse to give her a call please.

## 2022-11-04 NOTE — Telephone Encounter (Signed)
Ask her if she taking B12 or any B supplements

## 2022-11-06 DIAGNOSIS — D839 Common variable immunodeficiency, unspecified: Secondary | ICD-10-CM | POA: Diagnosis not present

## 2022-11-13 DIAGNOSIS — D839 Common variable immunodeficiency, unspecified: Secondary | ICD-10-CM | POA: Diagnosis not present

## 2022-11-18 ENCOUNTER — Encounter: Payer: Self-pay | Admitting: Internal Medicine

## 2022-11-18 ENCOUNTER — Ambulatory Visit (INDEPENDENT_AMBULATORY_CARE_PROVIDER_SITE_OTHER): Payer: Medicare Other | Admitting: Internal Medicine

## 2022-11-18 VITALS — BP 142/78 | HR 66 | Temp 98.4°F | Resp 16 | Ht 63.0 in | Wt 136.4 lb

## 2022-11-18 DIAGNOSIS — E039 Hypothyroidism, unspecified: Secondary | ICD-10-CM

## 2022-11-18 DIAGNOSIS — R7989 Other specified abnormal findings of blood chemistry: Secondary | ICD-10-CM | POA: Insufficient documentation

## 2022-11-18 LAB — VITAMIN B12: Vitamin B-12: 1064 pg/mL — ABNORMAL HIGH (ref 211–911)

## 2022-11-18 NOTE — Progress Notes (Signed)
Subjective:  Patient ID: [name], female    DOB: Nov 15, 1934  Age: 88 y.o. MRN: [id number]  CC: Hypothyroidism   HPI LIERIN LISKA presents for f/up ----  She returns to have her B12 rechecked.  She is not taking any B12 supplements.  She has been successfully treated for lymphoma but continues to complain of night sweats.  Outpatient Medications Prior to Visit  Medication Sig Dispense Refill   acetaminophen (TYLENOL) 500 MG tablet Take 500 mg by mouth every 6 (six) hours as needed (for pain.).      amitriptyline (ELAVIL) 10 MG tablet Take 10 mg by mouth at bedtime.     amLODipine (NORVASC) 5 MG tablet Take 1/2 tablet ( 2.5 mg ) daily 180 tablet 3   atorvastatin (LIPITOR) 40 MG tablet Take 40 mg by mouth daily in the afternoon.     Calcium Carbonate-Vitamin D 600-400 MG-UNIT tablet Take 1 tablet by mouth daily.     dexlansoprazole (DEXILANT) 60 MG capsule Take 60 mg by mouth daily.     EPINEPHrine 0.3 mg/0.3 mL IJ SOAJ injection Inject 0.3 mg into the muscle as needed for anaphylaxis.     ferrous fumarate (HEMOCYTE - 106 MG FE) 325 (106 Fe) MG TABS tablet Take 1 tablet by mouth daily.     furosemide (LASIX) 20 MG tablet TAKE 1 TABLET BY MOUTH EVERY DAY AS NEEDED FOR A WEIGHT OVER 140LBS 30 tablet 1   gabapentin (NEURONTIN) 300 MG capsule TAKE 1 CAPSULE(300 MG) BY MOUTH FOUR TIMES DAILY 360 capsule 1   Immune Globulin, Human, (HIZENTRA) 4 GM/20ML SOLN Inject 8 g into the skin once a week. Thursday's     JARDIANCE 10 MG TABS tablet TAKE 1 TABLET(10 MG) BY MOUTH DAILY BEFORE BREAKFAST 90 tablet 1   lisinopril (ZESTRIL) 10 MG tablet Take 1 tablet (10 mg total) by mouth daily. 90 tablet 1   Magnesium Oxide (MAG-OX 400 PO) Take 400 mg by mouth 2 (two) times a week.     metoprolol tartrate (LOPRESSOR) 50 MG tablet Take 1 tablet (50 mg total) by mouth 2 (two) times daily. 180 tablet 3   omeprazole (PRILOSEC) 40 MG capsule TAKE 1 CAPSULE(40 MG) BY MOUTH DAILY (Patient taking  differently: Take 40 mg by mouth daily.) 90 capsule 0   potassium chloride (KLOR-CON) 10 MEQ tablet TAKE 1 TABLET(10 MEQ) BY MOUTH DAILY (Patient taking differently: Take 10 mEq by mouth daily.) 90 tablet 0   thiamine (VITAMIN B1) 100 MG tablet Take 1 tablet (100 mg total) by mouth daily. 90 tablet 1   UNITHROID 100 MCG tablet Take 1 tablet (100 mcg total) by mouth daily before breakfast. 90 tablet 1   No facility-administered medications prior to visit.    ROS Review of Systems  Constitutional: Negative.  Negative for diaphoresis and fatigue.  HENT: Negative.    Eyes: Negative.   Respiratory:  Negative for cough, chest tightness, shortness of breath and wheezing.   Cardiovascular:  Negative for chest pain, palpitations and leg swelling.  Gastrointestinal:  Negative for abdominal pain, constipation, diarrhea, nausea and vomiting.  Endocrine: Negative.   Genitourinary: Negative.  Negative for difficulty urinating.  Musculoskeletal: Negative.  Negative for arthralgias.  Skin: Negative.   Psychiatric/Behavioral: Negative.      Objective:  BP (!) 142/78 (BP Location: Left Arm, Patient Position: Sitting, Cuff Size: Normal)   Pulse 66   Temp 98.4 F (36.9 C) (Oral)   Resp 16   Ht 5\' 3"  (  1.6 m)   Wt 136 lb 6.4 oz (61.9 kg)   SpO2 96%   BMI 24.16 kg/m   BP Readings from Last 3 Encounters:  11/18/22 (!) 142/78  10/27/22 116/70  08/24/22 120/84    Wt Readings from Last 3 Encounters:  11/18/22 136 lb 6.4 oz (61.9 kg)  10/27/22 137 lb (62.1 kg)  08/24/22 137 lb 9.6 oz (62.4 kg)    Physical Exam Constitutional:      General: She is not in acute distress.    Appearance: Normal appearance. She is not ill-appearing, toxic-appearing or diaphoretic.  HENT:     Mouth/Throat:     Mouth: Mucous membranes are moist.  Eyes:     General: No scleral icterus. Cardiovascular:     Rate and Rhythm: Normal rate and regular rhythm.     Heart sounds: No murmur heard. Pulmonary:      Effort: Pulmonary effort is normal.     Breath sounds: No stridor. No wheezing, rhonchi or rales.  Abdominal:     General: Abdomen is flat.     Palpations: There is no mass.     Tenderness: There is no abdominal tenderness. There is no guarding.     Hernia: No hernia is present.  Musculoskeletal:        General: Normal range of motion.     Cervical back: Neck supple.     Right lower leg: No edema.     Left lower leg: No edema.  Lymphadenopathy:     Cervical: No cervical adenopathy.  Skin:    General: Skin is warm and dry.  Neurological:     General: No focal deficit present.     Mental Status: She is alert. Mental status is at baseline.  Psychiatric:        Mood and Affect: Mood normal.        Behavior: Behavior normal.     Lab Results  Component Value Date   WBC 5.9 10/27/2022   HGB 14.1 10/27/2022   HCT 42.8 10/27/2022   PLT 197.0 10/27/2022   GLUCOSE 79 08/03/2022   CHOL 186 10/27/2022   TRIG 217.0 (H) 10/27/2022   HDL 39.00 (L) 10/27/2022   LDLDIRECT 125.0 10/27/2022   LDLCALC 96 06/03/2021   ALT 17 08/03/2022   AST 30 08/03/2022   NA 138 08/03/2022   K 4.2 08/03/2022   CL 104 08/03/2022   CREATININE 0.78 08/03/2022   BUN 15 08/03/2022   CO2 29 08/03/2022   TSH 1.10 10/27/2022    CT Chest W Contrast  Result Date: 08/04/2022 CLINICAL DATA:  Follicular lymphoma; * Tracking Code: BO * EXAM: CT CHEST, ABDOMEN, AND PELVIS WITH CONTRAST TECHNIQUE: Multidetector CT imaging of the chest, abdomen and pelvis was performed following the standard protocol during bolus administration of intravenous contrast. RADIATION DOSE REDUCTION: This exam was performed according to the departmental dose-optimization program which includes automated exposure control, adjustment of the mA and/or kV according to patient size and/or use of iterative reconstruction technique. CONTRAST:  80mL OMNIPAQUE IOHEXOL 300 MG/ML  SOLN COMPARISON:  CT chest, abdomen and pelvis dated August 12, 2021  FINDINGS: CT CHEST FINDINGS Cardiovascular: Cardiomegaly. Trace pericardial effusion. Normal caliber thoracic aorta with mild atherosclerotic disease. Left chest wall dual lead pacer with leads in the right atrium and right ventricle. Mediastinum/Nodes: Large hiatal hernia containing the stomach and a portion of the transverse colon. No pathologically enlarged lymph nodes seen in the chest. Lungs/Pleura: Central airways patent. No consolidation, pleural effusion  or pneumothorax. Stable small bilateral solid calcified pulmonary nodules, considered benign. Reference nodule of the right lower lobe measuring 5 mm on series 4, image 71. Reference nodule of the left upper lobe measuring 5 mm on image 41. Stable trace left pleural effusion. Musculoskeletal: No chest wall mass or suspicious bone lesions identified. CT ABDOMEN PELVIS FINDINGS Hepatobiliary: No focal liver abnormality is seen. No gallstones, gallbladder wall thickening, or biliary dilatation. Pancreas: Unremarkable. No pancreatic ductal dilatation or surrounding inflammatory changes. Spleen: Normal in size without focal abnormality. Adrenals/Urinary Tract: Bilateral adrenal glands are unremarkable. No hydronephrosis or nephrolithiasis bladder is unremarkable. Stomach/Bowel: Stomach is within normal limits. Appendix appears normal. Diverticulosis. No evidence of bowel wall thickening, distention, or inflammatory changes. Vascular/Lymphatic: Aortic atherosclerosis. No enlarged abdominal or pelvic lymph nodes. Reproductive: No adnexal mass. Other: No abdominal wall hernia or abnormality. No abdominopelvic ascites. Musculoskeletal: Severe scoliosis and degenerative disc disease. No aggressive appearing osseous lesions. IMPRESSION: 1. No lymphadenopathy in the chest, abdomen or pelvis. 2. No splenomegaly. 3. Large hiatal hernia containing the stomach and a portion of the transverse colon. 4. Stable small bilateral solid pulmonary nodules, considered benign. 5.  Stable trace left pleural effusion. 6. Aortic Atherosclerosis (ICD10-I70.0). Electronically Signed   By: Allegra Lai M.D.   On: 08/04/2022 08:29   CT Abdomen Pelvis W Contrast  Result Date: 08/04/2022 CLINICAL DATA:  Follicular lymphoma; * Tracking Code: BO * EXAM: CT CHEST, ABDOMEN, AND PELVIS WITH CONTRAST TECHNIQUE: Multidetector CT imaging of the chest, abdomen and pelvis was performed following the standard protocol during bolus administration of intravenous contrast. RADIATION DOSE REDUCTION: This exam was performed according to the departmental dose-optimization program which includes automated exposure control, adjustment of the mA and/or kV according to patient size and/or use of iterative reconstruction technique. CONTRAST:  80mL OMNIPAQUE IOHEXOL 300 MG/ML  SOLN COMPARISON:  CT chest, abdomen and pelvis dated August 12, 2021 FINDINGS: CT CHEST FINDINGS Cardiovascular: Cardiomegaly. Trace pericardial effusion. Normal caliber thoracic aorta with mild atherosclerotic disease. Left chest wall dual lead pacer with leads in the right atrium and right ventricle. Mediastinum/Nodes: Large hiatal hernia containing the stomach and a portion of the transverse colon. No pathologically enlarged lymph nodes seen in the chest. Lungs/Pleura: Central airways patent. No consolidation, pleural effusion or pneumothorax. Stable small bilateral solid calcified pulmonary nodules, considered benign. Reference nodule of the right lower lobe measuring 5 mm on series 4, image 71. Reference nodule of the left upper lobe measuring 5 mm on image 41. Stable trace left pleural effusion. Musculoskeletal: No chest wall mass or suspicious bone lesions identified. CT ABDOMEN PELVIS FINDINGS Hepatobiliary: No focal liver abnormality is seen. No gallstones, gallbladder wall thickening, or biliary dilatation. Pancreas: Unremarkable. No pancreatic ductal dilatation or surrounding inflammatory changes. Spleen: Normal in size without  focal abnormality. Adrenals/Urinary Tract: Bilateral adrenal glands are unremarkable. No hydronephrosis or nephrolithiasis bladder is unremarkable. Stomach/Bowel: Stomach is within normal limits. Appendix appears normal. Diverticulosis. No evidence of bowel wall thickening, distention, or inflammatory changes. Vascular/Lymphatic: Aortic atherosclerosis. No enlarged abdominal or pelvic lymph nodes. Reproductive: No adnexal mass. Other: No abdominal wall hernia or abnormality. No abdominopelvic ascites. Musculoskeletal: Severe scoliosis and degenerative disc disease. No aggressive appearing osseous lesions. IMPRESSION: 1. No lymphadenopathy in the chest, abdomen or pelvis. 2. No splenomegaly. 3. Large hiatal hernia containing the stomach and a portion of the transverse colon. 4. Stable small bilateral solid pulmonary nodules, considered benign. 5. Stable trace left pleural effusion. 6. Aortic Atherosclerosis (ICD10-I70.0). Electronically Signed  By: Allegra Lai M.D.   On: 08/04/2022 08:29    Assessment & Plan:   High serum vitamin B12- Her B12 level continues to decline.  This is consistent with the history of lymphoma.  She agrees to avoid B12 supplements. -     Vitamin B12; Future -     Methylmalonic acid, serum; Future  Hypothyroidism, unspecified type- She is euthyroid.     Follow-up: Return in about 6 months (around 05/21/2023).  Sanda Linger, MD

## 2022-11-18 NOTE — Patient Instructions (Signed)
Methylmalonic Acid Test Why am I having this test? The methylmalonic acid (MMA) test is done to measure the amount of MMA in your blood. You may have this test to check to see if you do not have enough vitamin B12 in your blood (a deficiency). Causes of a vitamin B12 deficiency include: A lack of nutrients and foods that contain vitamin B12 in your diet. Disorders that affect how your body absorbs nutrients from food. This test may also be done: If you had previous vitamin B12 tests and had unclear results. This test can detect early and mild forms of vitamin B12 deficiency. As part of routine tests for newborns at the hospital (newborn metabolic screenings). This test checks for a rare disease called methylmalonic acidemia. What is being tested? MMA is a substance that combines with vitamin B12 in the body. When vitamin B12 decreases, MMA increases. What kind of sample is taken?  A blood sample is required for this test. It is usually collected by inserting a needle into a blood vessel. How do I prepare for this test? Do not drink alcohol starting at least 8 hours before your test, or for as long as told by your health care provider. How are the results reported? Your results will be reported as a value that tells you how much MMA is in your blood. Your health care provider will compare your results to normal ranges that were established after testing a large group of people (reference ranges). Reference ranges may vary among labs and hospitals. For this test, common reference ranges are: Newborn: 0.2-2 micromoles/L. 44 days old: 0.24-0.39 micromoles/L. 6 weeks to 58 months old: 0.36-1.51 micromoles/L. 48-60 years old: 0.11-0.17 micromoles/L. 63-12 years old: 0.13-0.22 micromoles/L. 33-62 years old: 0.12-0.18 micromoles/L. Adult: 0-0.4 micromoles/L. What do the results mean? Results within the reference range are considered normal, meaning that you have normal levels of MMA in your  blood. Results that are higher than the reference range mean that you have too much MMA in your blood. This may result from: Kidney disease. Methylmalonic acidemia. Vitamin B12 deficiency. Talk with your health care provider about what your results mean. Questions to ask your health care provider Ask your health care provider, or the department that is doing the test: When will my results be ready? How will I get my results? What are my treatment options? What other tests do I need? What are my next steps? Summary Methylmalonic acid (MMA) testing is done to check the amount of MMA in your blood. MMA is a substance that combines with vitamin B12 in the body. As vitamin B12 decreases, MMA increases. Talk with your health care provider about what your results mean. This test can detect early and mild forms of vitamin B12 deficiency. This information is not intended to replace advice given to you by your health care provider. Make sure you discuss any questions you have with your health care provider. Document Revised: 02/07/2021 Document Reviewed: 02/07/2021 Elsevier Patient Education  2023 [employer].

## 2022-11-19 DIAGNOSIS — M791 Myalgia, unspecified site: Secondary | ICD-10-CM | POA: Diagnosis not present

## 2022-11-19 DIAGNOSIS — M47816 Spondylosis without myelopathy or radiculopathy, lumbar region: Secondary | ICD-10-CM | POA: Diagnosis not present

## 2022-11-20 DIAGNOSIS — D839 Common variable immunodeficiency, unspecified: Secondary | ICD-10-CM | POA: Diagnosis not present

## 2022-11-21 ENCOUNTER — Encounter: Payer: Self-pay | Admitting: Internal Medicine

## 2022-11-21 LAB — METHYLMALONIC ACID, SERUM: Methylmalonic Acid, Quant: 145 nmol/L (ref 87–318)

## 2022-11-23 ENCOUNTER — Encounter: Payer: Self-pay | Admitting: Internal Medicine

## 2022-11-27 DIAGNOSIS — D839 Common variable immunodeficiency, unspecified: Secondary | ICD-10-CM | POA: Diagnosis not present

## 2022-12-01 ENCOUNTER — Ambulatory Visit (INDEPENDENT_AMBULATORY_CARE_PROVIDER_SITE_OTHER): Payer: Medicare Other

## 2022-12-01 DIAGNOSIS — I495 Sick sinus syndrome: Secondary | ICD-10-CM | POA: Diagnosis not present

## 2022-12-01 LAB — CUP PACEART REMOTE DEVICE CHECK
Battery Remaining Longevity: 149 mo
Battery Voltage: 3.05 V
Brady Statistic AP VP Percent: 0.05 %
Brady Statistic AP VS Percent: 89.29 %
Brady Statistic AS VP Percent: 0.01 %
Brady Statistic AS VS Percent: 10.65 %
Brady Statistic RA Percent Paced: 89.31 %
Brady Statistic RV Percent Paced: 0.06 %
Date Time Interrogation Session: 20240603193747
Implantable Lead Connection Status: 753985
Implantable Lead Connection Status: 753985
Implantable Lead Implant Date: 20130319
Implantable Lead Implant Date: 20130319
Implantable Lead Location: 753859
Implantable Lead Location: 753860
Implantable Pulse Generator Implant Date: 20230306
Lead Channel Impedance Value: 342 Ohm
Lead Channel Impedance Value: 399 Ohm
Lead Channel Impedance Value: 437 Ohm
Lead Channel Impedance Value: 475 Ohm
Lead Channel Pacing Threshold Amplitude: 0.5 V
Lead Channel Pacing Threshold Amplitude: 0.875 V
Lead Channel Pacing Threshold Pulse Width: 0.4 ms
Lead Channel Pacing Threshold Pulse Width: 0.4 ms
Lead Channel Sensing Intrinsic Amplitude: 1.5 mV
Lead Channel Sensing Intrinsic Amplitude: 1.5 mV
Lead Channel Sensing Intrinsic Amplitude: 17.875 mV
Lead Channel Sensing Intrinsic Amplitude: 17.875 mV
Lead Channel Setting Pacing Amplitude: 1.5 V
Lead Channel Setting Pacing Amplitude: 2 V
Lead Channel Setting Pacing Pulse Width: 0.4 ms
Lead Channel Setting Sensing Sensitivity: 0.9 mV
Zone Setting Status: 755011
Zone Setting Status: 755011

## 2022-12-04 DIAGNOSIS — D839 Common variable immunodeficiency, unspecified: Secondary | ICD-10-CM | POA: Diagnosis not present

## 2022-12-11 DIAGNOSIS — D839 Common variable immunodeficiency, unspecified: Secondary | ICD-10-CM | POA: Diagnosis not present

## 2022-12-21 ENCOUNTER — Ambulatory Visit: Payer: Medicare Other

## 2022-12-23 NOTE — Progress Notes (Signed)
Remote pacemaker transmission.   

## 2022-12-24 ENCOUNTER — Other Ambulatory Visit: Payer: Self-pay | Admitting: Cardiovascular Disease

## 2022-12-24 ENCOUNTER — Ambulatory Visit: Payer: Medicare Other

## 2022-12-30 DIAGNOSIS — D839 Common variable immunodeficiency, unspecified: Secondary | ICD-10-CM | POA: Diagnosis not present

## 2023-01-06 DIAGNOSIS — D839 Common variable immunodeficiency, unspecified: Secondary | ICD-10-CM | POA: Diagnosis not present

## 2023-01-13 DIAGNOSIS — D839 Common variable immunodeficiency, unspecified: Secondary | ICD-10-CM | POA: Diagnosis not present

## 2023-01-20 DIAGNOSIS — D839 Common variable immunodeficiency, unspecified: Secondary | ICD-10-CM | POA: Diagnosis not present

## 2023-01-27 DIAGNOSIS — D839 Common variable immunodeficiency, unspecified: Secondary | ICD-10-CM | POA: Diagnosis not present

## 2023-02-02 DIAGNOSIS — H35363 Drusen (degenerative) of macula, bilateral: Secondary | ICD-10-CM | POA: Diagnosis not present

## 2023-02-03 DIAGNOSIS — D839 Common variable immunodeficiency, unspecified: Secondary | ICD-10-CM | POA: Diagnosis not present

## 2023-02-10 DIAGNOSIS — D839 Common variable immunodeficiency, unspecified: Secondary | ICD-10-CM | POA: Diagnosis not present

## 2023-02-11 ENCOUNTER — Ambulatory Visit: Payer: Medicare Other | Attending: Cardiovascular Disease | Admitting: Cardiovascular Disease

## 2023-02-17 DIAGNOSIS — D839 Common variable immunodeficiency, unspecified: Secondary | ICD-10-CM | POA: Diagnosis not present

## 2023-02-22 ENCOUNTER — Telehealth: Payer: Self-pay | Admitting: Internal Medicine

## 2023-02-22 DIAGNOSIS — G6289 Other specified polyneuropathies: Secondary | ICD-10-CM

## 2023-02-22 DIAGNOSIS — E039 Hypothyroidism, unspecified: Secondary | ICD-10-CM

## 2023-02-22 MED ORDER — UNITHROID 100 MCG PO TABS: 100.0000 ug | ORAL_TABLET | Freq: Every day | ORAL | 0 refills | Status: DC

## 2023-02-22 MED ORDER — GABAPENTIN 300 MG PO CAPS: 300.0000 mg | ORAL_CAPSULE | Freq: Four times a day (QID) | ORAL | 0 refills | Status: DC

## 2023-02-22 NOTE — Telephone Encounter (Signed)
Prescription Request  02/22/2023  LOV: 11/18/2022  What is the name of the medication or equipment? Synthroid, gabapentin  Have you contacted your pharmacy to request a refill? Yes   Which pharmacy would you like this sent to?  [id number] [place], [place] - [address] AT [facility] [address] [place] [address] Phone: [phone] Fax: [phone]    Patient notified that their request is being sent to the clinical staff for review and that they should receive a response within 2 business days.   Please advise at Mobile [phone] (mobile)

## 2023-02-22 NOTE — Telephone Encounter (Signed)
Sent refill to [facility].Marland KitchenShearon Stalls

## 2023-02-24 DIAGNOSIS — D839 Common variable immunodeficiency, unspecified: Secondary | ICD-10-CM | POA: Diagnosis not present

## 2023-03-02 ENCOUNTER — Ambulatory Visit (INDEPENDENT_AMBULATORY_CARE_PROVIDER_SITE_OTHER): Payer: Medicare Other

## 2023-03-02 DIAGNOSIS — I495 Sick sinus syndrome: Secondary | ICD-10-CM | POA: Diagnosis not present

## 2023-03-02 LAB — CUP PACEART REMOTE DEVICE CHECK
Battery Remaining Longevity: 146 mo
Battery Voltage: 3.04 V
Brady Statistic AP VP Percent: 0.06 %
Brady Statistic AP VS Percent: 90.89 %
Brady Statistic AS VP Percent: 0 %
Brady Statistic AS VS Percent: 9.04 %
Brady Statistic RA Percent Paced: 90.94 %
Brady Statistic RV Percent Paced: 0.07 %
Date Time Interrogation Session: 20240902213442
Implantable Lead Connection Status: 753985
Implantable Lead Connection Status: 753985
Implantable Lead Implant Date: 20130319
Implantable Lead Implant Date: 20130319
Implantable Lead Location: 753859
Implantable Lead Location: 753860
Implantable Pulse Generator Implant Date: 20230306
Lead Channel Impedance Value: 342 Ohm
Lead Channel Impedance Value: 380 Ohm
Lead Channel Impedance Value: 418 Ohm
Lead Channel Impedance Value: 437 Ohm
Lead Channel Pacing Threshold Amplitude: 0.625 V
Lead Channel Pacing Threshold Amplitude: 0.875 V
Lead Channel Pacing Threshold Pulse Width: 0.4 ms
Lead Channel Pacing Threshold Pulse Width: 0.4 ms
Lead Channel Sensing Intrinsic Amplitude: 1.125 mV
Lead Channel Sensing Intrinsic Amplitude: 1.125 mV
Lead Channel Sensing Intrinsic Amplitude: 17.5 mV
Lead Channel Sensing Intrinsic Amplitude: 17.5 mV
Lead Channel Setting Pacing Amplitude: 1.5 V
Lead Channel Setting Pacing Amplitude: 2 V
Lead Channel Setting Pacing Pulse Width: 0.4 ms
Lead Channel Setting Sensing Sensitivity: 0.9 mV
Zone Setting Status: 755011
Zone Setting Status: 755011

## 2023-03-03 DIAGNOSIS — D839 Common variable immunodeficiency, unspecified: Secondary | ICD-10-CM | POA: Diagnosis not present

## 2023-03-09 NOTE — Progress Notes (Signed)
Remote pacemaker transmission.   

## 2023-03-10 DIAGNOSIS — D839 Common variable immunodeficiency, unspecified: Secondary | ICD-10-CM | POA: Diagnosis not present

## 2023-03-12 ENCOUNTER — Other Ambulatory Visit: Payer: Self-pay | Admitting: Cardiovascular Disease

## 2023-03-17 DIAGNOSIS — D839 Common variable immunodeficiency, unspecified: Secondary | ICD-10-CM | POA: Diagnosis not present

## 2023-03-24 DIAGNOSIS — D839 Common variable immunodeficiency, unspecified: Secondary | ICD-10-CM | POA: Diagnosis not present

## 2023-03-31 DIAGNOSIS — D839 Common variable immunodeficiency, unspecified: Secondary | ICD-10-CM | POA: Diagnosis not present

## 2023-04-07 DIAGNOSIS — D839 Common variable immunodeficiency, unspecified: Secondary | ICD-10-CM | POA: Diagnosis not present

## 2023-04-14 DIAGNOSIS — D839 Common variable immunodeficiency, unspecified: Secondary | ICD-10-CM | POA: Diagnosis not present

## 2023-04-28 DIAGNOSIS — D839 Common variable immunodeficiency, unspecified: Secondary | ICD-10-CM | POA: Diagnosis not present

## 2023-05-03 ENCOUNTER — Telehealth: Payer: Self-pay | Admitting: Medical Oncology

## 2023-05-03 DIAGNOSIS — L049 Acute lymphadenitis, unspecified: Secondary | ICD-10-CM | POA: Diagnosis not present

## 2023-05-03 DIAGNOSIS — L040 Acute lymphadenitis of face, head and neck: Secondary | ICD-10-CM | POA: Diagnosis not present

## 2023-05-03 NOTE — Telephone Encounter (Signed)
New nodule left neck .It is sore and as big as her thumb, tender to touch. No fever. Turns head to swallow food or water.  Per Arbutus Ped , I sent a schedule message for appt next available .

## 2023-05-05 ENCOUNTER — Inpatient Hospital Stay: Payer: Medicare Other

## 2023-05-05 ENCOUNTER — Inpatient Hospital Stay: Payer: Medicare Other | Attending: Physician Assistant | Admitting: Physician Assistant

## 2023-05-05 ENCOUNTER — Other Ambulatory Visit: Payer: Self-pay

## 2023-05-05 ENCOUNTER — Telehealth: Payer: Self-pay

## 2023-05-05 VITALS — BP 164/99 | HR 84 | Temp 98.0°F | Resp 16 | Wt 136.6 lb

## 2023-05-05 DIAGNOSIS — R599 Enlarged lymph nodes, unspecified: Secondary | ICD-10-CM

## 2023-05-05 DIAGNOSIS — R911 Solitary pulmonary nodule: Secondary | ICD-10-CM | POA: Insufficient documentation

## 2023-05-05 DIAGNOSIS — R591 Generalized enlarged lymph nodes: Secondary | ICD-10-CM | POA: Insufficient documentation

## 2023-05-05 DIAGNOSIS — Z08 Encounter for follow-up examination after completed treatment for malignant neoplasm: Secondary | ICD-10-CM | POA: Diagnosis not present

## 2023-05-05 DIAGNOSIS — C8218 Follicular lymphoma grade II, lymph nodes of multiple sites: Secondary | ICD-10-CM

## 2023-05-05 DIAGNOSIS — D839 Common variable immunodeficiency, unspecified: Secondary | ICD-10-CM | POA: Diagnosis not present

## 2023-05-05 DIAGNOSIS — Z8572 Personal history of non-Hodgkin lymphomas: Secondary | ICD-10-CM | POA: Diagnosis not present

## 2023-05-05 LAB — CBC WITH DIFFERENTIAL (CANCER CENTER ONLY)
Abs Immature Granulocytes: 0.03 10*3/uL (ref 0.00–0.07)
Basophils Absolute: 0.1 10*3/uL (ref 0.0–0.1)
Basophils Relative: 1 %
Eosinophils Absolute: 0.1 10*3/uL (ref 0.0–0.5)
Eosinophils Relative: 2 %
HCT: 41.7 % (ref 36.0–46.0)
Hemoglobin: 14.5 g/dL (ref 12.0–15.0)
Immature Granulocytes: 1 %
Lymphocytes Relative: 31 %
Lymphs Abs: 2.1 10*3/uL (ref 0.7–4.0)
MCH: 33 pg (ref 26.0–34.0)
MCHC: 34.8 g/dL (ref 30.0–36.0)
MCV: 94.8 fL (ref 80.0–100.0)
Monocytes Absolute: 0.4 10*3/uL (ref 0.1–1.0)
Monocytes Relative: 6 %
Neutro Abs: 3.9 10*3/uL (ref 1.7–7.7)
Neutrophils Relative %: 59 %
Platelet Count: 220 10*3/uL (ref 150–400)
RBC: 4.4 MIL/uL (ref 3.87–5.11)
RDW: 13 % (ref 11.5–15.5)
WBC Count: 6.6 10*3/uL (ref 4.0–10.5)
nRBC: 0 % (ref 0.0–0.2)

## 2023-05-05 LAB — CMP (CANCER CENTER ONLY)
ALT: 13 U/L (ref 0–44)
AST: 26 U/L (ref 15–41)
Albumin: 4.2 g/dL (ref 3.5–5.0)
Alkaline Phosphatase: 82 U/L (ref 38–126)
Anion gap: 6 (ref 5–15)
BUN: 18 mg/dL (ref 8–23)
CO2: 28 mmol/L (ref 22–32)
Calcium: 10.1 mg/dL (ref 8.9–10.3)
Chloride: 106 mmol/L (ref 98–111)
Creatinine: 0.67 mg/dL (ref 0.44–1.00)
GFR, Estimated: >60 mL/min (ref >60)
Glucose, Bld: 81 mg/dL (ref 70–99)
Potassium: 4.4 mmol/L (ref 3.5–5.1)
Sodium: 140 mmol/L (ref 135–145)
Total Bilirubin: 0.6 mg/dL (ref <1.2)
Total Protein: 7.3 g/dL (ref 6.5–8.1)

## 2023-05-05 NOTE — Telephone Encounter (Signed)
RN called and spoke to patient regarding upcoming CT scan. She verbalized understanding of date, time and location of appointment. She knows to be NPO after 1230 11/12 and to arrive 2 hours prior to scan to drink the contrast.

## 2023-05-05 NOTE — Patient Instructions (Signed)
We are working on the prior authorization of your CT scans. Once we have that we will schedule your scans and contact you to inform you of the dates.  The phone number for the Central Scheduling Department is [phone] if you need to change the date of the scans.  We will schedule an appointment 1 week after the scans with Dr. Arbutus Ped to discuss the results.

## 2023-05-05 NOTE — Progress Notes (Signed)
Symptom Management Consult Note [facility]    Patient Care Team: [name], MD as PCP - General (Internal Medicine) Croitoru, Rachelle Hora, MD as PCP - Cardiology (Cardiology) Izell Grandview, MD as Consulting Physician (Endocrinology) Si Gaul, MD as Consulting Physician (Medical Oncology) Thurmon Fair, MD as Consulting Physician (Cardiology) Glendale Chard, DO as Consulting Physician (Neurology)    Name / MRN / DOBKarolyne Foster  [id number]  11/16/1934   Date of visit: 05/05/2023   Chief Complaint/Reason for visit: lymphadenopathy   Current Therapy: Observation  Last treatment:  Finished maintenance 12 cycles of Rituxan on 10/01/2014   ASSESSMENT & PLAN: Patient is a 88 y.o. female with oncologic history of non-Hodgkin lymphoma, follicular center cell type with predominant follicular pattern favor high-grade (grade 3/3) diagnosed in November 2012. followed by Dr. Arbutus Ped.  I have viewed most recent oncology note and lab work.    #Non-Hodgkin lymphoma, follicular center cell type with predominant follicular pattern favor high-grade (grade 3/3) diagnosed in November 2012.  - Last saw oncologist 08/06/22. Restaging CT CAP scan on 08/03/22 showinng no lymphadenopathy in the chest, abdomen or pelvis    #Lymphadenopathy -New onset or enlarged left deep cervical node, non-tender, no associated symptoms of infection or systemic illness. No recent illnesses or other swollen lymph nodes.  -Labs including CBC and CMP are normal today. -Discussed with Dr. Arbutus Ped who agrees with plan for CT neck, CAP for further evaluation. He will follow up with patient 1 week after scan to discuss results.    Strict ED precautions discussed should symptoms worsen.   Heme/Onc History: Oncology History   No history exists.      Interval history-: Discussed the use of AI scribe software for clinical note transcription with the patient, who gave verbal consent to  proceed.    [name] is a 88 y.o. female with oncologic history of non-Hodgkin lymphoma, follicular center cell type with predominant follicular pattern favor high-grade (grade 3/3) diagnosed in November 2012 presenting to [facility] today with chief complaint of new left-sided lymphadenopathy.  She presents to clinic unaccompanied today.  She first noticed the thumb-sized node x 5 days ago and reports no apparent change in size since then. The node is not associated with pain, difficulty swallowing, recent illness, fever, night sweats, or weight loss. There have been no major changes in the patient's health since their last oncology follow-up in February, and they deny any other concerning symptoms such as shortness of breath or chest pain. She has not noticed any other enlarged lymph nodes. Denies recent dental infection.   ROS  All other systems are reviewed and are negative for acute change except as noted in the HPI.    Allergies  Allergen Reactions   Tramadol Itching   Amoxicillin Hives    Has patient had a PCN reaction causing immediate rash, facial/tongue/throat swelling, SOB or lightheadedness with hypotension: No Has patient had a PCN reaction causing severe rash involving mucus membranes or skin necrosis: No Has patient had a PCN reaction that required hospitalization: No Has patient had a PCN reaction occurring within the last 10 years: No If all of the above answers are "NO", then may proceed with Cephalosporin use.    Buprenorphine Hives   Codeine Hives and Other (See Comments)    "makes her crazy"    Demerol Hives   Dextromethorphan-Guaifenesin     insomnia   Morphine And Codeine Hives   Sulfonamide Derivatives Rash  Vicodin [Hydrocodone-Acetaminophen] Hives     Past Medical History:  Diagnosis Date   Anemia    pt denies   Arthritis    Atrial fibrillation (HCC)    Cataract    bil   Common variable immunodeficiency (HCC)    Cystocele    history with  rectocele, pt denies   GERD (gastroesophageal reflux disease)    H/O hiatal hernia    Hypertension    Hypothyroidism    Non Hodgkin's lymphoma (HCC)    dx 2012 and now in remission   Osteoporosis    Pacemaker 09/15/2011   Medtronic Revo   Pericarditis    Pleural effusion, bilateral    history of   Pulmonary nodules    history of   Scoliosis    Varicose vein      Past Surgical History:  Procedure Laterality Date   BREAST SURGERY  07/30/01   biopsy x 3, bilateral   CATARACT EXTRACTION Bilateral    COLONOSCOPY     ESOPHAGOGASTRODUODENOSCOPY (EGD) WITH PROPOFOL N/A 12/10/2020   Procedure: ESOPHAGOGASTRODUODENOSCOPY (EGD) WITH PROPOFOL;  Surgeon: Kerin Salen, MD;  Location: [facility] ENDOSCOPY;  Service: Gastroenterology;  Laterality: N/A;  NG Tube Insertion   MASS EXCISION Right 10/28/2012   Procedure: Removal of mass on neck       ;  Surgeon: Ardeth Sportsman, MD;  Location: [facility] OR;  Service: General;  Laterality: Right;   PARTIAL HYSTERECTOMY  04/29/2004   PERMANENT PACEMAKER INSERTION N/A 09/15/2011   Procedure: PERMANENT PACEMAKER INSERTION;  Surgeon: Thurmon Fair, MD;  Location: [facility] CATH LAB;  Service: Cardiovascular;  Laterality: N/A;   PPM GENERATOR CHANGEOUT N/A 09/01/2021   Procedure: PPM GENERATOR CHANGEOUT;  Surgeon: Thurmon Fair, MD;  Location: [facility] INVASIVE CV LAB;  Service: Cardiovascular;  Laterality: N/A;   RADIOACTIVE SEED GUIDED EXCISIONAL BREAST BIOPSY Left 08/11/2017   Procedure: LEFT RADIOACTIVE SEED GUIDED EXCISIONAL BREAST BIOPSY ERAS PATHWAY;  Surgeon: Almond Lint, MD;  Location: [facility] OR;  Service: General;  Laterality: Left;   TONSILLECTOMY      Social History   Socioeconomic History   Marital status: Widowed    Spouse name: Not on file   Number of children: 3   Years of education: Not on file   Highest education level: Not on file  Occupational History   Occupation: [employer]  Tobacco Use   Smoking status: Never   Smokeless tobacco: Never  Vaping  Use   Vaping status: Never Used  Substance and Sexual Activity   Alcohol use: Yes    Comment: 1 glass of wine occasionally   Drug use: No   Sexual activity: Not on file  Other Topics Concern   Not on file  Social History Narrative   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  She lives alone in a 3-story home.  There is a lift chair which she does not use (placed for her husband).      Right handed    Social Determinants of Health   Financial Resource Strain: Not on file  Food Insecurity: Not on file  Transportation Needs: Not on file  Physical Activity: Not on file  Stress: Not on file  Social Connections: Unknown (06/19/2022)   Received from [facility], [facility]   Social Network    Social Network: Not on file  Intimate Partner Violence: Unknown (06/19/2022)   Received from [facility], [facility]   HITS    Physically Hurt: Not on file  Insult or Talk Down To: Not on file    Threaten Physical Harm: Not on file    Scream or Curse: Not on file    Family History  Problem Relation Age of Onset   Stroke Mother    Stroke Father    Heart disease Daughter        congenital "whole in her heart"   Lymphoma Daughter        nonhodgkins   Breast cancer Sister    Heart disease Brother    Colon cancer Neg Hx    Esophageal cancer Neg Hx    Pancreatic cancer Neg Hx    Stomach cancer Neg Hx    Liver disease Neg Hx      Current Outpatient Medications:    acetaminophen (TYLENOL) 500 MG tablet, Take 500 mg by mouth every 6 (six) hours as needed (for pain.). , Disp: , Rfl:    amitriptyline (ELAVIL) 10 MG tablet, Take 10 mg by mouth at bedtime., Disp: , Rfl:    amLODipine (NORVASC) 5 MG tablet, Take 1/2 tablet ( 2.5 mg ) daily, Disp: 180 tablet, Rfl: 3   atorvastatin (LIPITOR) 40 MG tablet, Take 40 mg by mouth daily in the afternoon., Disp: , Rfl:    Calcium Carbonate-Vitamin D 600-400 MG-UNIT tablet, Take 1 tablet  by mouth daily., Disp: , Rfl:    dexlansoprazole (DEXILANT) 60 MG capsule, Take 60 mg by mouth daily., Disp: , Rfl:    EPINEPHrine 0.3 mg/0.3 mL IJ SOAJ injection, Inject 0.3 mg into the muscle as needed for anaphylaxis., Disp: , Rfl:    ferrous fumarate (HEMOCYTE - 106 MG FE) 325 (106 Fe) MG TABS tablet, Take 1 tablet by mouth daily., Disp: , Rfl:    furosemide (LASIX) 20 MG tablet, TAKE 1 TABLET BY MOUTH EVERY DAY AS NEEDED FOR A WEIGHT OVER 140LBS, Disp: 30 tablet, Rfl: 1   gabapentin (NEURONTIN) 300 MG capsule, Take 1 capsule (300 mg total) by mouth 4 (four) times daily., Disp: 360 capsule, Rfl: 0   Immune Globulin, Human, (HIZENTRA) 4 GM/20ML SOLN, Inject 8 g into the skin once a week. Thursday's, Disp: , Rfl:    JARDIANCE 10 MG TABS tablet, TAKE 1 TABLET(10 MG) BY MOUTH DAILY BEFORE BREAKFAST, Disp: 90 tablet, Rfl: 1   lisinopril (ZESTRIL) 10 MG tablet, TAKE 1 TABLET(10 MG) BY MOUTH DAILY, Disp: 90 tablet, Rfl: 1   Magnesium Oxide (MAG-OX 400 PO), Take 400 mg by mouth 2 (two) times a week., Disp: , Rfl:    metoprolol tartrate (LOPRESSOR) 50 MG tablet, Take 1 tablet (50 mg total) by mouth 2 (two) times daily., Disp: 180 tablet, Rfl: 3   omeprazole (PRILOSEC) 40 MG capsule, TAKE 1 CAPSULE(40 MG) BY MOUTH DAILY (Patient taking differently: Take 40 mg by mouth daily.), Disp: 90 capsule, Rfl: 0   potassium chloride (KLOR-CON) 10 MEQ tablet, TAKE 1 TABLET(10 MEQ) BY MOUTH DAILY (Patient taking differently: Take 10 mEq by mouth daily.), Disp: 90 tablet, Rfl: 0   thiamine (VITAMIN B1) 100 MG tablet, Take 1 tablet (100 mg total) by mouth daily., Disp: 90 tablet, Rfl: 1   UNITHROID 100 MCG tablet, Take 1 tablet (100 mcg total) by mouth daily before breakfast., Disp: 90 tablet, Rfl: 0  PHYSICAL EXAM: ECOG FS:1 - Symptomatic but completely ambulatory    Vitals:   05/05/23 1047  BP: (!) 164/99  Pulse: 84  Resp: 16  Temp: 98 F (36.7 C)  TempSrc: Oral  SpO2: 98%  Weight:  136 lb 9.6 oz (62 kg)    Physical Exam Vitals and nursing note reviewed.  Constitutional:      Appearance: She is not ill-appearing or toxic-appearing.  HENT:     Head: Normocephalic.  Eyes:     Conjunctiva/sclera: Conjunctivae normal.  Cardiovascular:     Rate and Rhythm: Normal rate and regular rhythm.     Pulses: Normal pulses.     Heart sounds: Normal heart sounds.  Pulmonary:     Effort: Pulmonary effort is normal.     Breath sounds: Normal breath sounds.  Abdominal:     General: There is no distension.  Musculoskeletal:     Cervical back: Normal range of motion.  Lymphadenopathy:     Cervical: Cervical adenopathy present.     Left cervical: Deep cervical adenopathy (approx 2 cm. non tender. no overlything erythema) present.     Upper Body:     Right upper body: No supraclavicular, axillary, pectoral or epitrochlear adenopathy.     Left upper body: No supraclavicular, axillary, pectoral or epitrochlear adenopathy.     Comments: Patient deferred lower body exam including palpation of inguinal region  Skin:    General: Skin is warm and dry.  Neurological:     Mental Status: She is alert.        LABORATORY DATA: I have reviewed the data as listed    Latest Ref Rng & Units 05/05/2023   10:15 AM 10/27/2022    3:37 PM 08/03/2022    9:13 AM  CBC  WBC 4.0 - 10.5 K/uL 6.6  5.9  7.0   Hemoglobin 12.0 - 15.0 g/dL 91.4  78.2  95.6   Hematocrit 36.0 - 46.0 % 41.7  42.8  45.9   Platelets 150 - 400 K/uL 220  197.0  147         Latest Ref Rng & Units 05/05/2023   10:15 AM 08/03/2022    9:13 AM 04/15/2022    2:12 PM  CMP  Glucose 70 - 99 mg/dL 81  79  80   BUN 8 - 23 mg/dL 18  15  16    Creatinine 0.44 - 1.00 mg/dL 2.13  0.86  5.78   Sodium 135 - 145 mmol/L 140  138  143   Potassium 3.5 - 5.1 mmol/L 4.4  4.2  4.9   Chloride 98 - 111 mmol/L 106  104  105   CO2 22 - 32 mmol/L 28  29  26    Calcium 8.9 - 10.3 mg/dL 46.9  9.9  9.9   Total Protein 6.5 - 8.1 g/dL 7.3  7.0    Total Bilirubin <1.2  mg/dL 0.6  0.3    Alkaline Phos 38 - 126 U/L 82  59    AST 15 - 41 U/L 26  30    ALT 0 - 44 U/L 13  17         RADIOGRAPHIC STUDIES (from last 24 hours if applicable) I have personally reviewed the radiological images as listed and agreed with the findings in the report. No results found.      Visit Diagnosis: 1. Enlarged lymph nodes   2. History of lymphoma      Orders Placed This Encounter  Procedures   CT Soft Tissue Neck W Contrast    Standing Status:   Future    Standing Expiration Date:   05/04/2024    Order Specific Question:   If indicated for the ordered procedure, I authorize the administration of  contrast media per Radiology protocol    Answer:   Yes    Order Specific Question:   Does the patient have a contrast media/X-ray dye allergy?    Answer:   No    Order Specific Question:   Preferred imaging location?    Answer:   [facility]   CT CHEST ABDOMEN PELVIS W CONTRAST    Standing Status:   Future    Standing Expiration Date:   05/04/2024    Order Specific Question:   If indicated for the ordered procedure, I authorize the administration of contrast media per Radiology protocol    Answer:   Yes    Order Specific Question:   Does the patient have a contrast media/X-ray dye allergy?    Answer:   No    Order Specific Question:   Preferred imaging location?    Answer:   [facility]    Order Specific Question:   If indicated for the ordered procedure, I authorize the administration of oral contrast media per Radiology protocol    Answer:   Yes    All questions were answered. The patient knows to call the clinic with any problems, questions or concerns. No barriers to learning was detected.  A total of more than 30 minutes were spent on this encounter with face-to-face time and non-face-to-face time, including preparing to see the patient, ordering tests and/or medications, counseling the patient and coordination of care as outlined above.     Thank you for allowing me to participate in the care of this patient.    Shanon Ace, PA-C Department of Hematology/Oncology [facility] Phone: [phone]  [phone]    05/05/2023 1:28 PM

## 2023-05-07 ENCOUNTER — Telehealth: Payer: Self-pay | Admitting: Internal Medicine

## 2023-05-07 NOTE — Telephone Encounter (Signed)
Scheduled per providers request, patient has been called and notified. ?

## 2023-05-11 ENCOUNTER — Ambulatory Visit (HOSPITAL_COMMUNITY)
Admission: RE | Admit: 2023-05-11 | Discharge: 2023-05-11 | Disposition: A | Discharge status: Home / Self Care | Payer: Medicare Other | Source: Ambulatory Visit | Attending: Physician Assistant | Admitting: Physician Assistant

## 2023-05-11 DIAGNOSIS — I672 Cerebral atherosclerosis: Secondary | ICD-10-CM | POA: Diagnosis not present

## 2023-05-11 DIAGNOSIS — R599 Enlarged lymph nodes, unspecified: Secondary | ICD-10-CM | POA: Diagnosis not present

## 2023-05-11 DIAGNOSIS — K5730 Diverticulosis of large intestine without perforation or abscess without bleeding: Secondary | ICD-10-CM | POA: Diagnosis not present

## 2023-05-11 DIAGNOSIS — M19011 Primary osteoarthritis, right shoulder: Secondary | ICD-10-CM | POA: Diagnosis not present

## 2023-05-11 DIAGNOSIS — N289 Disorder of kidney and ureter, unspecified: Secondary | ICD-10-CM | POA: Diagnosis not present

## 2023-05-11 DIAGNOSIS — K769 Liver disease, unspecified: Secondary | ICD-10-CM | POA: Diagnosis not present

## 2023-05-11 DIAGNOSIS — M5030 Other cervical disc degeneration, unspecified cervical region: Secondary | ICD-10-CM | POA: Diagnosis not present

## 2023-05-11 DIAGNOSIS — R590 Localized enlarged lymph nodes: Secondary | ICD-10-CM | POA: Diagnosis not present

## 2023-05-11 MED ORDER — IOHEXOL 300 MG/ML  SOLN
100.0000 mL | Freq: Once | INTRAMUSCULAR | Status: AC | PRN
  Administered 2023-05-11: 100 mL via INTRAVENOUS

## 2023-05-12 ENCOUNTER — Other Ambulatory Visit: Payer: Self-pay | Admitting: Internal Medicine

## 2023-05-12 DIAGNOSIS — D839 Common variable immunodeficiency, unspecified: Secondary | ICD-10-CM | POA: Diagnosis not present

## 2023-05-12 DIAGNOSIS — G6289 Other specified polyneuropathies: Secondary | ICD-10-CM

## 2023-05-14 NOTE — Progress Notes (Unsigned)
[facility] OFFICE PROGRESS NOTE  Erica Grandchild, MD [address] [place] [address]  DIAGNOSIS: non-Hodgkin lymphoma, follicular center cell type with predominant follicular pattern favor high-grade (grade 3/3) diagnosed in November 2012.   PRIOR THERAPY: 1) Status post treatment with Rituxan weekly for 3 doses in addition to 3 tablets of Afinitor at [facility] in [place].  2) Status post surgical excision of the right neck mass under the care of Dr. Michaell Cowing on 10/28/2012.  3)  Maintenance Rituxan 375 mg/M2 every 2 months status post 12 cycles, last dose was given 10/01/2014.  CURRENT THERAPY: Observation   INTERVAL HISTORY: Erica CUCUZZA 88 y.o. female returns to the clinic today for a follow-up visit.  In summary the patient was followed by Dr. Arbutus Ped on an annual basis for her history of non-Hodgkin's lymphoma.  She was last seen by Dr. Arbutus Ped in February 2024.  She called the clinic in November 2024 with concerns for cervical/submandibular lymphadenopathy.  She was seen in the symptom management clinic.  She was endorsing 5 days of a thumb sized nodule with left-sided cervical adenopathy. She denied any associated difficulty swallowing, recent illness, fever, night sweats, or weight loss. She denied any major changes in her health since she was last seen in February.  She denied any unexplained weight loss.  Denies any signs of infection such as nasal congestion, sore throat, cough, hemoptysis, or shortness of breath.  She denies any significant allergies.  She did not notice any other enlarged lymph nodes.  Denies any dental infection or scalp infections.  The patient is a never smoker.  Dr. Arbutus Ped had recommended CT of the soft tissue of the neck and CT of the chest, abdomen, and pelvis. She is here today for evaluation to review her scan results.   MEDICAL HISTORY: Past Medical History:  Diagnosis Date   Anemia    pt denies   Arthritis    Atrial  fibrillation (HCC)    Cataract    bil   Common variable immunodeficiency (HCC)    Cystocele    history with rectocele, pt denies   GERD (gastroesophageal reflux disease)    H/O hiatal hernia    Hypertension    Hypothyroidism    Non Hodgkin's lymphoma (HCC)    dx 2012 and now in remission   Osteoporosis    Pacemaker 09/15/2011   Medtronic Revo   Pericarditis    Pleural effusion, bilateral    history of   Pulmonary nodules    history of   Scoliosis    Varicose vein     ALLERGIES:  is allergic to tramadol, amoxicillin, buprenorphine, codeine, demerol, dextromethorphan-guaifenesin, morphine and codeine, sulfonamide derivatives, and vicodin [hydrocodone-acetaminophen].  MEDICATIONS:  Current Outpatient Medications  Medication Sig Dispense Refill   acetaminophen (TYLENOL) 500 MG tablet Take 500 mg by mouth every 6 (six) hours as needed (for pain.).      amitriptyline (ELAVIL) 10 MG tablet Take 10 mg by mouth at bedtime.     amLODipine (NORVASC) 5 MG tablet Take 1/2 tablet ( 2.5 mg ) daily 180 tablet 3   atorvastatin (LIPITOR) 40 MG tablet Take 40 mg by mouth daily in the afternoon.     Calcium Carbonate-Vitamin D 600-400 MG-UNIT tablet Take 1 tablet by mouth daily.     dexlansoprazole (DEXILANT) 60 MG capsule Take 60 mg by mouth daily.     EPINEPHrine 0.3 mg/0.3 mL IJ SOAJ injection Inject 0.3 mg into the muscle  as needed for anaphylaxis.     ferrous fumarate (HEMOCYTE - 106 MG FE) 325 (106 Fe) MG TABS tablet Take 1 tablet by mouth daily.     furosemide (LASIX) 20 MG tablet TAKE 1 TABLET BY MOUTH EVERY DAY AS NEEDED FOR A WEIGHT OVER 140LBS 30 tablet 1   gabapentin (NEURONTIN) 300 MG capsule TAKE 1 CAPSULE(300 MG) BY MOUTH FOUR TIMES DAILY 360 capsule 0   Immune Globulin, Human, (HIZENTRA) 4 GM/20ML SOLN Inject 8 g into the skin once a week. Thursday's     JARDIANCE 10 MG TABS tablet TAKE 1 TABLET(10 MG) BY MOUTH DAILY BEFORE BREAKFAST 90 tablet 1   lisinopril (ZESTRIL) 10 MG  tablet TAKE 1 TABLET(10 MG) BY MOUTH DAILY 90 tablet 1   Magnesium Oxide (MAG-OX 400 PO) Take 400 mg by mouth 2 (two) times a week.     metoprolol tartrate (LOPRESSOR) 50 MG tablet Take 1 tablet (50 mg total) by mouth 2 (two) times daily. 180 tablet 3   omeprazole (PRILOSEC) 40 MG capsule TAKE 1 CAPSULE(40 MG) BY MOUTH DAILY (Patient taking differently: Take 40 mg by mouth daily.) 90 capsule 0   potassium chloride (KLOR-CON) 10 MEQ tablet TAKE 1 TABLET(10 MEQ) BY MOUTH DAILY (Patient taking differently: Take 10 mEq by mouth daily.) 90 tablet 0   thiamine (VITAMIN B1) 100 MG tablet Take 1 tablet (100 mg total) by mouth daily. 90 tablet 1   UNITHROID 100 MCG tablet Take 1 tablet (100 mcg total) by mouth daily before breakfast. 90 tablet 0   No current facility-administered medications for this visit.    SURGICAL HISTORY:  Past Surgical History:  Procedure Laterality Date   BREAST SURGERY  07/30/01   biopsy x 3, bilateral   CATARACT EXTRACTION Bilateral    COLONOSCOPY     ESOPHAGOGASTRODUODENOSCOPY (EGD) WITH PROPOFOL N/A 12/10/2020   Procedure: ESOPHAGOGASTRODUODENOSCOPY (EGD) WITH PROPOFOL;  Surgeon: Kerin Salen, MD;  Location[facility] ENDOSCOPY;  Service: Gastroenterology;  Laterality: N/A;  NG Tube Insertion   MASS EXCISION Right 10/28/2012   Procedure: Removal of mass on neck       ;  Surgeon: Ardeth Sportsman, MD;  Location: [facility] OR;  Service: General;  Laterality: Right;   PARTIAL HYSTERECTOMY  04/29/2004   PERMANENT PACEMAKER INSERTION N/A 09/15/2011   Procedure: PERMANENT PACEMAKER INSERTION;  Surgeon: Thurmon Fair, MD;  Location: [facility] CATH LAB;  Service: Cardiovascular;  Laterality: N/A;   PPM GENERATOR CHANGEOUT N/A 09/01/2021   Procedure: PPM GENERATOR CHANGEOUT;  Surgeon: Thurmon Fair, MD;  Location: [facility] INVASIVE CV LAB;  Service: Cardiovascular;  Laterality: N/A;   RADIOACTIVE SEED GUIDED EXCISIONAL BREAST BIOPSY Left 08/11/2017   Procedure: LEFT RADIOACTIVE SEED GUIDED EXCISIONAL BREAST  BIOPSY ERAS PATHWAY;  Surgeon: Almond Lint, MD;  Location: [facility] OR;  Service: General;  Laterality: Left;   TONSILLECTOMY      REVIEW OF SYSTEMS:   Review of Systems  Constitutional: Negative for appetite change, chills, fatigue, fever and unexpected weight change.  HENT: Positive for submandibular/upper cervical enlarged lymph nodes.  Negative for mouth sores, nosebleeds, sore throat and trouble swallowing.   Eyes: Negative for eye problems and icterus.  Respiratory: Negative for cough, hemoptysis, shortness of breath and wheezing.   Cardiovascular: Negative for chest pain and leg swelling.  Gastrointestinal: Negative for abdominal pain, constipation, diarrhea, nausea and vomiting.  Genitourinary: Negative for bladder incontinence, difficulty urinating, dysuria, frequency and hematuria.   Musculoskeletal: Negative for back pain, gait problem, neck pain and neck stiffness.  Skin: Negative for itching and rash.  Neurological: Negative for dizziness, extremity weakness, gait problem, headaches, light-headedness and seizures.  Hematological: Negative for adenopathy. Does not bruise/bleed easily.  Psychiatric/Behavioral: Negative for confusion, depression and sleep disturbance. The patient is not nervous/anxious.     PHYSICAL EXAMINATION:  There were no vitals taken for this visit.  ECOG PERFORMANCE STATUS: 1  Physical Exam  Constitutional: Oriented to person, place, and time and thin appearing female and in no distress. HENT:  Head: Normocephalic and atraumatic.  Mouth/Throat: Oropharynx is clear and moist. No oropharyngeal exudate.  Eyes: Conjunctivae are normal. Right eye exhibits no discharge. Left eye exhibits no discharge. No scleral icterus.  Neck: Normal range of motion. Neck supple.  Cardiovascular: Normal rate, regular rhythm, normal heart sounds and intact distal pulses.   Pulmonary/Chest: Effort normal and breath sounds normal. No respiratory distress. No wheezes. No rales.   Abdominal: Soft. Bowel sounds are normal. Exhibits no distension and no mass. There is no tenderness.  Musculoskeletal: Normal range of motion. Exhibits no edema.  Lymphadenopathy:    No cervical adenopathy.  Neurological: Alert and oriented to person, place, and time. Exhibits normal muscle tone. Gait normal. Coordination normal.  Skin: Skin is warm and dry. No rash noted. Not diaphoretic. No erythema. No pallor.  Psychiatric: Mood, memory and judgment normal.  Vitals reviewed.  LABORATORY DATA: Lab Results  Component Value Date   WBC 6.6 05/05/2023   HGB 14.5 05/05/2023   HCT 41.7 05/05/2023   MCV 94.8 05/05/2023   PLT 220 05/05/2023      Chemistry      Component Value Date/Time   NA 140 05/05/2023 1015   NA 143 04/15/2022 1412   NA 143 06/16/2017 0921   K 4.4 05/05/2023 1015   K 3.8 06/16/2017 0921   CL 106 05/05/2023 1015   CL 109 (H) 12/14/2012 1009   CO2 28 05/05/2023 1015   CO2 28 06/16/2017 0921   BUN 18 05/05/2023 1015   BUN 16 04/15/2022 1412   BUN 11.7 06/16/2017 0921   CREATININE 0.67 05/05/2023 1015   CREATININE 0.8 06/16/2017 0921      Component Value Date/Time   CALCIUM 10.1 05/05/2023 1015   CALCIUM 9.7 06/16/2017 0921   ALKPHOS 82 05/05/2023 1015   ALKPHOS 63 06/16/2017 0921   AST 26 05/05/2023 1015   AST 23 06/16/2017 0921   ALT 13 05/05/2023 1015   ALT 18 06/16/2017 0921   BILITOT 0.6 05/05/2023 1015   BILITOT 0.52 06/16/2017 0921       RADIOGRAPHIC STUDIES:  No results found.   ASSESSMENT/PLAN:  This is a very pleasant 88 year old Caucasian female with recurrent non-Hodgkin lymphoma, follicular center cell type status post treatment with Rituxan as well as Afinitor at [facility] and also completed a course of maintenance Rituxan for 12 cycles last dose was given 10/01/2014.  She called the clinic endorsing left surgical knot/nodule.  Therefore CT of the neck, chest, abdomen, and pelvis was ordered.  The patient was seen by Dr.  Arbutus Ped today.  Dr. Arbutus Ped personally and independently reviewed the scan and discussed the results with the patient today.  The scan showed no concerns in the neck but showed a new large pulmonary nodule in the medial aspect of the right lower lobe concerning for malignancy.   Dr. Arbutus Ped recommends a PET scan to further evaluate. If abnormal, then we will consider a biopsy.   We will see her back in 3 weeks or  so to review the PET scan results and discuss next steps.   The patient was advised to call immediately if she has any concerning symptoms in the interval. The patient voices understanding of current disease status and treatment options and is in agreement with the current care plan. All questions were answered. The patient knows to call the clinic with any problems, questions or concerns. We can certainly see the patient much sooner if necessary  No orders of the defined types were placed in this encounter.    Marte Celani L Bonetta Mostek, PA-C 05/14/23  ADDENDUM: Hematology/Oncology Attending: I had a face-to-face encounter with the patient today.  I reviewed her records, lab, scan and recommended her care plan.  This is a very pleasant 88 years old white female with non-Hodgkin lymphoma, follicular center cell type with predominant follicular pattern favoring high-grade diagnosed in November 2012.  She is status post several treatment in the past including Rituxan as well as Afinitor or followed by surgical excision of right neck mass and maintenance Rituxan for 12 months.  She has been in observation since April 2016.  She presented with palpable lymph node in the submandibular area that were concerning for her. The patient had repeat CT scan of the neck, chest, abdomen and pelvis performed recently.  I personally and independently reviewed the scan and discussed the result with the patient today. Her scan showed no concerning lymphadenopathy in the neck area but there was new large  pulmonary nodule in the medial aspect of the right lower lobe concerning for malignancy other small pulmonary nodules were stable and likely benign. I recommended for the patient to have a PET scan for further evaluation of this lesion and to rule out neoplasm in that area. She will come back for follow-up visit in 3-4 weeks for evaluation and discussion of her PET scan results and further recommendation regarding her condition. The patient was advised to call immediately if she has any other concerning symptoms in the interval. The total time spent in the appointment was 30 minutes. Disclaimer: This note was dictated with voice recognition software. Similar sounding words can inadvertently be transcribed and may be missed upon review. Lajuana Matte, MD

## 2023-05-17 ENCOUNTER — Other Ambulatory Visit: Payer: Self-pay

## 2023-05-17 ENCOUNTER — Inpatient Hospital Stay (HOSPITAL_BASED_OUTPATIENT_CLINIC_OR_DEPARTMENT_OTHER): Payer: Medicare Other | Admitting: Physician Assistant

## 2023-05-17 VITALS — BP 128/88 | HR 84 | Temp 97.6°F | Resp 16 | Wt 136.1 lb

## 2023-05-17 DIAGNOSIS — C8221 Follicular lymphoma grade III, unspecified, lymph nodes of head, face, and neck: Secondary | ICD-10-CM

## 2023-05-17 DIAGNOSIS — Z8572 Personal history of non-Hodgkin lymphomas: Secondary | ICD-10-CM | POA: Diagnosis not present

## 2023-05-17 DIAGNOSIS — R591 Generalized enlarged lymph nodes: Secondary | ICD-10-CM | POA: Diagnosis not present

## 2023-05-17 DIAGNOSIS — R911 Solitary pulmonary nodule: Secondary | ICD-10-CM | POA: Diagnosis not present

## 2023-05-17 DIAGNOSIS — Z08 Encounter for follow-up examination after completed treatment for malignant neoplasm: Secondary | ICD-10-CM | POA: Diagnosis not present

## 2023-05-19 DIAGNOSIS — D839 Common variable immunodeficiency, unspecified: Secondary | ICD-10-CM | POA: Diagnosis not present

## 2023-05-26 DIAGNOSIS — D839 Common variable immunodeficiency, unspecified: Secondary | ICD-10-CM | POA: Diagnosis not present

## 2023-06-01 ENCOUNTER — Ambulatory Visit (HOSPITAL_COMMUNITY)
Admission: RE | Admit: 2023-06-01 | Discharge: 2023-06-01 | Disposition: A | Discharge status: Home / Self Care | Payer: Medicare Other | Source: Ambulatory Visit | Attending: Physician Assistant | Admitting: Physician Assistant

## 2023-06-01 ENCOUNTER — Ambulatory Visit (INDEPENDENT_AMBULATORY_CARE_PROVIDER_SITE_OTHER): Payer: Medicare Other

## 2023-06-01 DIAGNOSIS — R911 Solitary pulmonary nodule: Secondary | ICD-10-CM | POA: Diagnosis not present

## 2023-06-01 DIAGNOSIS — C8221 Follicular lymphoma grade III, unspecified, lymph nodes of head, face, and neck: Secondary | ICD-10-CM | POA: Diagnosis not present

## 2023-06-01 DIAGNOSIS — I495 Sick sinus syndrome: Secondary | ICD-10-CM

## 2023-06-01 DIAGNOSIS — R918 Other nonspecific abnormal finding of lung field: Secondary | ICD-10-CM | POA: Diagnosis not present

## 2023-06-01 LAB — CUP PACEART REMOTE DEVICE CHECK
Battery Remaining Longevity: 142 mo
Battery Voltage: 3.03 V
Brady Statistic AP VP Percent: 0.07 %
Brady Statistic AP VS Percent: 94.3 %
Brady Statistic AS VP Percent: 0 %
Brady Statistic AS VS Percent: 5.63 %
Brady Statistic RA Percent Paced: 94.36 %
Brady Statistic RV Percent Paced: 0.07 %
Date Time Interrogation Session: 20241203043738
Implantable Lead Connection Status: 753985
Implantable Lead Connection Status: 753985
Implantable Lead Implant Date: 20130319
Implantable Lead Implant Date: 20130319
Implantable Lead Location: 753859
Implantable Lead Location: 753860
Implantable Pulse Generator Implant Date: 20230306
Lead Channel Impedance Value: 323 Ohm
Lead Channel Impedance Value: 380 Ohm
Lead Channel Impedance Value: 399 Ohm
Lead Channel Impedance Value: 437 Ohm
Lead Channel Pacing Threshold Amplitude: 0.625 V
Lead Channel Pacing Threshold Amplitude: 1.25 V
Lead Channel Pacing Threshold Pulse Width: 0.4 ms
Lead Channel Pacing Threshold Pulse Width: 0.4 ms
Lead Channel Sensing Intrinsic Amplitude: 1.875 mV
Lead Channel Sensing Intrinsic Amplitude: 1.875 mV
Lead Channel Sensing Intrinsic Amplitude: 21.25 mV
Lead Channel Sensing Intrinsic Amplitude: 21.25 mV
Lead Channel Setting Pacing Amplitude: 1.5 V
Lead Channel Setting Pacing Amplitude: 2.5 V
Lead Channel Setting Pacing Pulse Width: 0.4 ms
Lead Channel Setting Sensing Sensitivity: 0.9 mV
Zone Setting Status: 755011
Zone Setting Status: 755011

## 2023-06-01 LAB — GLUCOSE, CAPILLARY: Glucose-Capillary: 81 mg/dL (ref 70–99)

## 2023-06-01 MED ORDER — FLUDEOXYGLUCOSE F - 18 (FDG) INJECTION
6.5000 | Freq: Once | INTRAVENOUS | Status: AC | PRN
  Administered 2023-06-01: 6.76 via INTRAVENOUS

## 2023-06-02 DIAGNOSIS — D839 Common variable immunodeficiency, unspecified: Secondary | ICD-10-CM | POA: Diagnosis not present

## 2023-06-04 DIAGNOSIS — M1712 Unilateral primary osteoarthritis, left knee: Secondary | ICD-10-CM | POA: Diagnosis not present

## 2023-06-07 ENCOUNTER — Other Ambulatory Visit: Payer: Self-pay

## 2023-06-07 ENCOUNTER — Telehealth: Payer: Self-pay

## 2023-06-07 NOTE — Telephone Encounter (Signed)
Pt LVM stating she's completed her PET Scan on 06/01/2023 and does not have a f/u appt to discuss PET Scan results.  This nurse reviewed pt's chart and PET Scan has not been read and no f/u appt until February 2025 is scheduled.  Notified Dr. Arbutus Ped and his Team of the pt's call.

## 2023-06-09 ENCOUNTER — Other Ambulatory Visit: Payer: Self-pay

## 2023-06-09 ENCOUNTER — Telehealth: Payer: Self-pay | Admitting: Internal Medicine

## 2023-06-09 DIAGNOSIS — D839 Common variable immunodeficiency, unspecified: Secondary | ICD-10-CM | POA: Diagnosis not present

## 2023-06-09 NOTE — Telephone Encounter (Signed)
Patient is aware of scheduled appointment times/dates

## 2023-06-14 ENCOUNTER — Other Ambulatory Visit: Payer: Self-pay | Admitting: Internal Medicine

## 2023-06-14 ENCOUNTER — Other Ambulatory Visit: Payer: Self-pay | Admitting: Cardiovascular Disease

## 2023-06-14 DIAGNOSIS — F0280 Dementia in other diseases classified elsewhere without behavioral disturbance: Secondary | ICD-10-CM

## 2023-06-16 DIAGNOSIS — D839 Common variable immunodeficiency, unspecified: Secondary | ICD-10-CM | POA: Diagnosis not present

## 2023-06-17 ENCOUNTER — Telehealth: Payer: Self-pay | Admitting: Medical Oncology

## 2023-06-17 NOTE — Telephone Encounter (Signed)
Pt received PET scan results . " Why is my LN swollen"? I  instructed her  to  f/u with PCP for "enlarged lymph node.".

## 2023-06-18 ENCOUNTER — Inpatient Hospital Stay: Payer: Medicare Other | Admitting: Internal Medicine

## 2023-06-23 DIAGNOSIS — D839 Common variable immunodeficiency, unspecified: Secondary | ICD-10-CM | POA: Diagnosis not present

## 2023-06-30 DIAGNOSIS — D839 Common variable immunodeficiency, unspecified: Secondary | ICD-10-CM | POA: Diagnosis not present

## 2023-07-02 ENCOUNTER — Other Ambulatory Visit: Payer: Self-pay | Admitting: Cardiovascular Disease

## 2023-07-02 ENCOUNTER — Other Ambulatory Visit: Payer: Self-pay | Admitting: Internal Medicine

## 2023-07-02 DIAGNOSIS — G6289 Other specified polyneuropathies: Secondary | ICD-10-CM

## 2023-07-07 DIAGNOSIS — D839 Common variable immunodeficiency, unspecified: Secondary | ICD-10-CM | POA: Diagnosis not present

## 2023-07-14 DIAGNOSIS — D839 Common variable immunodeficiency, unspecified: Secondary | ICD-10-CM | POA: Diagnosis not present

## 2023-07-16 DIAGNOSIS — M791 Myalgia, unspecified site: Secondary | ICD-10-CM | POA: Diagnosis not present

## 2023-07-16 DIAGNOSIS — M47816 Spondylosis without myelopathy or radiculopathy, lumbar region: Secondary | ICD-10-CM | POA: Diagnosis not present

## 2023-07-21 DIAGNOSIS — D839 Common variable immunodeficiency, unspecified: Secondary | ICD-10-CM | POA: Diagnosis not present

## 2023-07-28 DIAGNOSIS — D839 Common variable immunodeficiency, unspecified: Secondary | ICD-10-CM | POA: Diagnosis not present

## 2023-08-02 ENCOUNTER — Ambulatory Visit (HOSPITAL_COMMUNITY): Admission: RE | Admit: 2023-08-02 | Payer: Medicare Other | Source: Ambulatory Visit

## 2023-08-02 ENCOUNTER — Other Ambulatory Visit (HOSPITAL_COMMUNITY): Payer: Medicare Other

## 2023-08-02 ENCOUNTER — Inpatient Hospital Stay: Payer: Medicare Other

## 2023-08-04 ENCOUNTER — Inpatient Hospital Stay: Payer: Medicare Other | Admitting: Internal Medicine

## 2023-08-04 ENCOUNTER — Telehealth: Payer: Self-pay | Admitting: Internal Medicine

## 2023-08-04 DIAGNOSIS — D839 Common variable immunodeficiency, unspecified: Secondary | ICD-10-CM | POA: Diagnosis not present

## 2023-08-04 DIAGNOSIS — M1712 Unilateral primary osteoarthritis, left knee: Secondary | ICD-10-CM | POA: Diagnosis not present

## 2023-08-05 DIAGNOSIS — D839 Common variable immunodeficiency, unspecified: Secondary | ICD-10-CM | POA: Diagnosis not present

## 2023-08-05 DIAGNOSIS — J019 Acute sinusitis, unspecified: Secondary | ICD-10-CM | POA: Diagnosis not present

## 2023-08-06 ENCOUNTER — Other Ambulatory Visit: Payer: Self-pay | Admitting: Internal Medicine

## 2023-08-06 ENCOUNTER — Inpatient Hospital Stay: Payer: Medicare Other | Attending: Physician Assistant

## 2023-08-06 ENCOUNTER — Ambulatory Visit (HOSPITAL_COMMUNITY)
Admission: RE | Admit: 2023-08-06 | Discharge: 2023-08-06 | Disposition: A | Discharge status: Home / Self Care | Payer: Medicare Other | Source: Ambulatory Visit | Attending: Internal Medicine | Admitting: Internal Medicine

## 2023-08-06 DIAGNOSIS — R911 Solitary pulmonary nodule: Secondary | ICD-10-CM

## 2023-08-06 DIAGNOSIS — J019 Acute sinusitis, unspecified: Secondary | ICD-10-CM | POA: Diagnosis not present

## 2023-08-06 DIAGNOSIS — Z8572 Personal history of non-Hodgkin lymphomas: Secondary | ICD-10-CM | POA: Insufficient documentation

## 2023-08-06 DIAGNOSIS — C8218 Follicular lymphoma grade II, lymph nodes of multiple sites: Secondary | ICD-10-CM

## 2023-08-06 DIAGNOSIS — C8221 Follicular lymphoma grade III, unspecified, lymph nodes of head, face, and neck: Secondary | ICD-10-CM

## 2023-08-06 DIAGNOSIS — K5730 Diverticulosis of large intestine without perforation or abscess without bleeding: Secondary | ICD-10-CM | POA: Diagnosis not present

## 2023-08-06 DIAGNOSIS — R918 Other nonspecific abnormal finding of lung field: Secondary | ICD-10-CM | POA: Diagnosis not present

## 2023-08-06 DIAGNOSIS — D839 Common variable immunodeficiency, unspecified: Secondary | ICD-10-CM | POA: Diagnosis not present

## 2023-08-06 DIAGNOSIS — Z08 Encounter for follow-up examination after completed treatment for malignant neoplasm: Secondary | ICD-10-CM | POA: Insufficient documentation

## 2023-08-06 LAB — CBC WITH DIFFERENTIAL (CANCER CENTER ONLY)
Abs Immature Granulocytes: 0.03 10*3/uL (ref 0.00–0.07)
Basophils Absolute: 0.1 10*3/uL (ref 0.0–0.1)
Basophils Relative: 1 %
Eosinophils Absolute: 0.1 10*3/uL (ref 0.0–0.5)
Eosinophils Relative: 2 %
HCT: 43.8 % (ref 36.0–46.0)
Hemoglobin: 14.5 g/dL (ref 12.0–15.0)
Immature Granulocytes: 0 %
Lymphocytes Relative: 29 %
Lymphs Abs: 2.2 10*3/uL (ref 0.7–4.0)
MCH: 31.9 pg (ref 26.0–34.0)
MCHC: 33.1 g/dL (ref 30.0–36.0)
MCV: 96.3 fL (ref 80.0–100.0)
Monocytes Absolute: 0.5 10*3/uL (ref 0.1–1.0)
Monocytes Relative: 6 %
Neutro Abs: 4.7 10*3/uL (ref 1.7–7.7)
Neutrophils Relative %: 62 %
Platelet Count: 161 10*3/uL (ref 150–400)
RBC: 4.55 MIL/uL (ref 3.87–5.11)
RDW: 13.4 % (ref 11.5–15.5)
WBC Count: 7.6 10*3/uL (ref 4.0–10.5)
nRBC: 0 % (ref 0.0–0.2)

## 2023-08-06 LAB — CMP (CANCER CENTER ONLY)
ALT: 16 U/L (ref 0–44)
AST: 25 U/L (ref 15–41)
Albumin: 4.1 g/dL (ref 3.5–5.0)
Alkaline Phosphatase: 63 U/L (ref 38–126)
Anion gap: 3 — ABNORMAL LOW (ref 5–15)
BUN: 15 mg/dL (ref 8–23)
CO2: 30 mmol/L (ref 22–32)
Calcium: 9.5 mg/dL (ref 8.9–10.3)
Chloride: 106 mmol/L (ref 98–111)
Creatinine: 0.72 mg/dL (ref 0.44–1.00)
GFR, Estimated: >60 mL/min (ref >60)
Glucose, Bld: 80 mg/dL (ref 70–99)
Potassium: 4 mmol/L (ref 3.5–5.1)
Sodium: 139 mmol/L (ref 135–145)
Total Bilirubin: 0.6 mg/dL (ref 0.0–1.2)
Total Protein: 7 g/dL (ref 6.5–8.1)

## 2023-08-06 MED ORDER — IOHEXOL 300 MG/ML  SOLN
30.0000 mL | Freq: Once | INTRAMUSCULAR | Status: AC | PRN
  Administered 2023-08-06: 30 mL via ORAL

## 2023-08-06 MED ORDER — IOHEXOL 300 MG/ML  SOLN
100.0000 mL | Freq: Once | INTRAMUSCULAR | Status: AC | PRN
  Administered 2023-08-06: 100 mL via INTRAVENOUS

## 2023-08-11 DIAGNOSIS — D839 Common variable immunodeficiency, unspecified: Secondary | ICD-10-CM | POA: Diagnosis not present

## 2023-08-11 DIAGNOSIS — M1712 Unilateral primary osteoarthritis, left knee: Secondary | ICD-10-CM | POA: Diagnosis not present

## 2023-08-12 ENCOUNTER — Inpatient Hospital Stay: Payer: Medicare Other | Admitting: Internal Medicine

## 2023-08-12 VITALS — BP 151/89 | HR 86 | Temp 98.8°F | Resp 17 | Wt 129.1 lb

## 2023-08-12 DIAGNOSIS — Z08 Encounter for follow-up examination after completed treatment for malignant neoplasm: Secondary | ICD-10-CM | POA: Diagnosis not present

## 2023-08-12 DIAGNOSIS — C8221 Follicular lymphoma grade III, unspecified, lymph nodes of head, face, and neck: Secondary | ICD-10-CM | POA: Diagnosis not present

## 2023-08-12 DIAGNOSIS — Z8572 Personal history of non-Hodgkin lymphomas: Secondary | ICD-10-CM | POA: Diagnosis not present

## 2023-08-12 NOTE — Progress Notes (Signed)
[facility] Telephone:([phone]) [phone]   Fax:([phone]) [phone]  OFFICE PROGRESS NOTE  Erica Grandchild, MD [address] [place] [address]  DIAGNOSIS: Recurrent non-Hodgkin lymphoma, follicular center cell type with predominant follicular pattern favor high-grade (grade 3/3) diagnosed in November 2012.  PRIOR THERAPY: 1) Status post treatment with Rituxan weekly for 3 doses in addition to 3 tablets of Afinitor at [facility] in [place].  2) Status post surgical excision of the right neck mass under the care of Dr. Michaell Cowing on 10/28/2012.  3)  Maintenance Rituxan 375 mg/M2 every 2 months status post 12 cycles, last dose was given 10/01/2014.  CURRENT THERAPY: Observation.  INTERVAL HISTORY: [name] 88 y.o. female returns to clinic today for follow-up visit.Discussed the use of AI scribe software for clinical note transcription with the patient, who gave verbal consent to proceed.  History of Present Illness   [name] is an 89 year old female with recurrent non-Hodgkin lymphoma who presents for follow-up after a PET scan.  Initially diagnosed with recurrent non-Hodgkin lymphoma, follicular center type, in November 2012, she underwent treatment with Rituxan and Afinitor, followed by two years of maintenance Rituxan, with the last dose in April 2016. Since then, she has been under observation.  In December 2024, she noticed nodes in the back of her neck, raising concerns about a recurrence of lymphoma. A PET scan showed no concerning findings except for a suspicious nodule in the lung. A follow-up scan of the chest, abdomen, and pelvis showed no change in the lung nodule, suggesting chronic scarring.  She feels okay today with no new complaints. No chest pain, shortness of breath, cough, recent weight loss, or night sweats. She mentions a swollen gland under her mandible, which was not active on the PET scan.        MEDICAL HISTORY: Past  Medical History:  Diagnosis Date   Anemia    pt denies   Arthritis    Atrial fibrillation (HCC)    Cataract    bil   Common variable immunodeficiency (HCC)    Cystocele    history with rectocele, pt denies   GERD (gastroesophageal reflux disease)    H/O hiatal hernia    Hypertension    Hypothyroidism    Non Hodgkin's lymphoma (HCC)    dx 2012 and now in remission   Osteoporosis    Pacemaker 09/15/2011   Medtronic Revo   Pericarditis    Pleural effusion, bilateral    history of   Pulmonary nodules    history of   Scoliosis    Varicose vein     ALLERGIES:  is allergic to tramadol, amoxicillin, buprenorphine, codeine, demerol, dextromethorphan-guaifenesin, morphine and codeine, sulfonamide derivatives, and vicodin [hydrocodone-acetaminophen].  MEDICATIONS:  Current Outpatient Medications  Medication Sig Dispense Refill   lisinopril (ZESTRIL) 10 MG tablet TAKE 1 TABLET(10 MG) BY MOUTH DAILY 90 tablet 0   metoprolol tartrate (LOPRESSOR) 50 MG tablet TAKE 1 TABLET(50 MG) BY MOUTH TWICE DAILY 180 tablet 0   acetaminophen (TYLENOL) 500 MG tablet Take 500 mg by mouth every 6 (six) hours as needed (for pain.).      amitriptyline (ELAVIL) 10 MG tablet Take 10 mg by mouth at bedtime.     amLODipine (NORVASC) 5 MG tablet Take 1/2 tablet ( 2.5 mg ) daily 180 tablet 3   atorvastatin (LIPITOR) 40 MG tablet Take 40 mg by mouth daily in the afternoon.  Calcium Carbonate-Vitamin D 600-400 MG-UNIT tablet Take 1 tablet by mouth daily.     dexlansoprazole (DEXILANT) 60 MG capsule Take 60 mg by mouth daily.     EPINEPHrine 0.3 mg/0.3 mL IJ SOAJ injection Inject 0.3 mg into the muscle as needed for anaphylaxis.     ferrous fumarate (HEMOCYTE - 106 MG FE) 325 (106 Fe) MG TABS tablet Take 1 tablet by mouth daily.     furosemide (LASIX) 20 MG tablet TAKE 1 TABLET BY MOUTH EVERY DAY AS NEEDED FOR A WEIGHT OVER 140LBS 30 tablet 1   gabapentin (NEURONTIN) 300 MG capsule TAKE 1 CAPSULE(300 MG) BY  MOUTH FOUR TIMES DAILY 360 capsule 0   Immune Globulin, Human, (HIZENTRA) 4 GM/20ML SOLN Inject 8 g into the skin once a week. Thursday's     JARDIANCE 10 MG TABS tablet TAKE 1 TABLET(10 MG) BY MOUTH DAILY BEFORE BREAKFAST 90 tablet 1   Magnesium Oxide (MAG-OX 400 PO) Take 400 mg by mouth 2 (two) times a week.     omeprazole (PRILOSEC) 40 MG capsule TAKE 1 CAPSULE(40 MG) BY MOUTH DAILY (Patient taking differently: Take 40 mg by mouth daily.) 90 capsule 0   potassium chloride (KLOR-CON) 10 MEQ tablet TAKE 1 TABLET(10 MEQ) BY MOUTH DAILY (Patient taking differently: Take 10 mEq by mouth daily.) 90 tablet 0   thiamine (VITAMIN B1) 100 MG tablet TAKE 1 TABLET(100 MG) BY MOUTH DAILY 90 tablet 0   UNITHROID 100 MCG tablet Take 1 tablet (100 mcg total) by mouth daily before breakfast. 90 tablet 0   No current facility-administered medications for this visit.    SURGICAL HISTORY:  Past Surgical History:  Procedure Laterality Date   BREAST SURGERY  07/30/01   biopsy x 3, bilateral   CATARACT EXTRACTION Bilateral    COLONOSCOPY     ESOPHAGOGASTRODUODENOSCOPY (EGD) WITH PROPOFOL N/A 12/10/2020   Procedure: ESOPHAGOGASTRODUODENOSCOPY (EGD) WITH PROPOFOL;  Surgeon: Kerin Salen, MD;  [facility] ENDOSCOPY;  Service: Gastroenterology;  Laterality: N/A;  NG Tube Insertion   MASS EXCISION Right 10/28/2012   Procedure: Removal of mass on neck       ;  Surgeon: Ardeth Sportsman, MD;  Location: [facility] OR;  Service: General;  Laterality: Right;   PARTIAL HYSTERECTOMY  04/29/2004   PERMANENT PACEMAKER INSERTION N/A 09/15/2011   Procedure: PERMANENT PACEMAKER INSERTION;  Surgeon: Thurmon Fair, MD;  Location: [facility] CATH LAB;  Service: Cardiovascular;  Laterality: N/A;   PPM GENERATOR CHANGEOUT N/A 09/01/2021   Procedure: PPM GENERATOR CHANGEOUT;  Surgeon: Thurmon Fair, MD;  Location: [facility] INVASIVE CV LAB;  Service: Cardiovascular;  Laterality: N/A;   RADIOACTIVE SEED GUIDED EXCISIONAL BREAST BIOPSY Left 08/11/2017    Procedure: LEFT RADIOACTIVE SEED GUIDED EXCISIONAL BREAST BIOPSY ERAS PATHWAY;  Surgeon: Almond Lint, MD;  Location: [facility] OR;  Service: General;  Laterality: Left;   TONSILLECTOMY      REVIEW OF SYSTEMS:  A comprehensive review of systems was negative.   PHYSICAL EXAMINATION: General appearance: alert, cooperative, and no distress Head: Normocephalic, without obvious abnormality, atraumatic Neck: no adenopathy, no JVD, supple, symmetrical, trachea midline, and thyroid not enlarged, symmetric, no tenderness/mass/nodules Lymph nodes: Cervical, supraclavicular, and axillary nodes normal. Resp: clear to auscultation bilaterally Back: symmetric, no curvature. ROM normal. No CVA tenderness. Cardio: regular rate and rhythm, S1, S2 normal, no murmur, click, rub or gallop GI: soft, non-tender; bowel sounds normal; no masses,  no organomegaly Extremities: extremities normal, atraumatic, no cyanosis or edema  ECOG PERFORMANCE STATUS: 1 - Symptomatic  but completely ambulatory  Blood pressure (!) 151/89, pulse 86, temperature 98.8 F (37.1 C), temperature source Temporal, resp. rate 17, weight 129 lb 1.6 oz (58.6 kg), SpO2 94%.  LABORATORY DATA: Lab Results  Component Value Date   WBC 7.6 08/06/2023   HGB 14.5 08/06/2023   HCT 43.8 08/06/2023   MCV 96.3 08/06/2023   PLT 161 08/06/2023      Chemistry      Component Value Date/Time   NA 139 08/06/2023 1021   NA 143 04/15/2022 1412   NA 143 06/16/2017 0921   K 4.0 08/06/2023 1021   K 3.8 06/16/2017 0921   CL 106 08/06/2023 1021   CL 109 (H) 12/14/2012 1009   CO2 30 08/06/2023 1021   CO2 28 06/16/2017 0921   BUN 15 08/06/2023 1021   BUN 16 04/15/2022 1412   BUN 11.7 06/16/2017 0921   CREATININE 0.72 08/06/2023 1021   CREATININE 0.8 06/16/2017 0921      Component Value Date/Time   CALCIUM 9.5 08/06/2023 1021   CALCIUM 9.7 06/16/2017 0921   ALKPHOS 63 08/06/2023 1021   ALKPHOS 63 06/16/2017 0921   AST 25 08/06/2023 1021   AST 23  06/16/2017 0921   ALT 16 08/06/2023 1021   ALT 18 06/16/2017 0921   BILITOT 0.6 08/06/2023 1021   BILITOT 0.52 06/16/2017 0921       RADIOGRAPHIC STUDIES: CT CHEST ABDOMEN PELVIS W CONTRAST Result Date: 08/09/2023 CLINICAL DATA:  Follicular lymphoma restaging * Tracking Code: BO * EXAM: CT CHEST, ABDOMEN, AND PELVIS WITH CONTRAST TECHNIQUE: Multidetector CT imaging of the chest, abdomen and pelvis was performed following the standard protocol during bolus administration of intravenous contrast. RADIATION DOSE REDUCTION: This exam was performed according to the departmental dose-optimization program which includes automated exposure control, adjustment of the mA and/or kV according to patient size and/or use of iterative reconstruction technique. CONTRAST:  OMNIPAQUE IOHEXOL 300 MG/ML SOLN additional oral enteric contrast COMPARISON:  PET-CT, 06/01/2023 FINDINGS: CT CHEST FINDINGS Cardiovascular: Left chest multilead pacer. Cardiomegaly. Scattered left coronary artery calcifications. Enlargement of the main pulmonary artery measuring up to 3.8 cm. No pericardial effusion. Mediastinum/Nodes: No enlarged mediastinal, hilar, or axillary lymph nodes. Large paraesophageal hernia containing the stomach and splenic flexure of the colon (series 2, image 38). Thyroid gland, trachea, and esophagus demonstrate no significant findings. Lungs/Pleura: Unchanged nodular consolidation of the medial right lung base measuring 1.7 x 1.7 cm on today's examination, not previously PET avid and consistent with chronic scarring or atelectasis (series 4, image 107). Additional compressive atelectasis of the left lower lobe secondary to large paraesophageal hernia. Unchanged small pulmonary nodules, largest a subpleural nodule of the anterior right upper lobe measuring 0.5 cm (series 4, image 67). No pleural effusion or pneumothorax. Musculoskeletal: No chest wall abnormality. No acute osseous findings. CT ABDOMEN PELVIS  FINDINGS Hepatobiliary: No solid liver abnormality is seen. No gallstones, gallbladder wall thickening, or biliary dilatation. Pancreas: Unremarkable. No pancreatic ductal dilatation or surrounding inflammatory changes. Spleen: Normal in size without significant abnormality. Adrenals/Urinary Tract: Adrenal glands are unremarkable. Kidneys are normal, without renal calculi, solid lesion, or hydronephrosis. Bladder is unremarkable. Stomach/Bowel: Stomach is within normal limits. Appendix appears normal. No evidence of bowel wall thickening, distention, or inflammatory changes. Descending and sigmoid diverticulosis. Vascular/Lymphatic: Aortic atherosclerosis. No enlarged abdominal or pelvic lymph nodes. Reproductive: Status post hysterectomy. Other: No abdominal wall hernia or abnormality. No ascites. Musculoskeletal: No acute osseous findings. Severe levoscoliosis of the thoracolumbar spine associated disc degenerative  disease Please note that definitively benign incidental findings, functional findings, and anatomic variants may have been intentionally omitted from this report in the interest of brevity and clarity. If not specifically noted and recommended in the impression, no further follow-up or characterization is required for incidental findings. IMPRESSION: 1. No enlarged lymph nodes of the chest, abdomen, or pelvis. 2. Unchanged nodular consolidation of the medial right lung base, not previously PET avid and consistent with chronic scarring or atelectasis. 3. Unchanged small pulmonary nodules, presumed benign. 4. Large paraesophageal hernia containing the stomach and splenic flexure of the colon. 5. Cardiomegaly and coronary artery disease. 6. Enlargement of the main pulmonary artery, as can be seen in pulmonary hypertension. Aortic Atherosclerosis (ICD10-I70.0). Electronically Signed   By: Jearld Lesch M.D.   On: 08/09/2023 21:58     ASSESSMENT AND PLAN:  This is a very pleasant 89 years old white  female with recurrent non-Hodgkin lymphoma, follicular center cell type status post treatment with Rituxan as well as Afinitor at [facility] and also completed a course of maintenance Rituxan for 12 cycles last dose was given 10/01/2014. The patient is currently on observation and she is feeling fine with no concerning complaints.    Recurrent Non-Hodgkin Lymphoma, Follicular Center Type Diagnosed in November 2012. Initially treated with Rituxan and Afinitor, followed by two years of maintenance Rituxan, with the last dose in April 2016. On observation since then. In December, she reported concern about cervical lymphadenopathy. PET scan showed no concerning findings except a lung nodule, later determined to be chronic scarring. No new symptoms reported. Physical exam unremarkable except for a non-active submandibular swollen gland. - Repeat scan in one year - Perform weekly lab work and scans.   The patient was advised to call immediately if she has any concerning symptoms in the interval. The patient voices understanding of current disease status and treatment options and is in agreement with the current care plan. All questions were answered. The patient knows to call the clinic with any problems, questions or concerns. We can certainly see the patient much sooner if necessary.   Disclaimer: This note was dictated with voice recognition software. Similar sounding words can inadvertently be transcribed and may not be corrected upon review.

## 2023-08-13 ENCOUNTER — Other Ambulatory Visit: Payer: Self-pay | Admitting: Physician Assistant

## 2023-08-13 ENCOUNTER — Telehealth: Payer: Self-pay

## 2023-08-13 NOTE — Telephone Encounter (Signed)
Patients daughter called in with concerns of patients lymph nodes in the neck.  Informed daughter that the patient had a PET scan 05/2023 and per Dr. Asa Lente note there was no concerning findings except for a suspicious nodule in the lung and a follow up CT scan of the chest, abdomen, pelvis showed no changes in the lung nodule. Patients daughter expressed thanks.  Informed patients daughter to call our office with any concerns or questions.

## 2023-08-18 DIAGNOSIS — D839 Common variable immunodeficiency, unspecified: Secondary | ICD-10-CM | POA: Diagnosis not present

## 2023-08-25 DIAGNOSIS — M1712 Unilateral primary osteoarthritis, left knee: Secondary | ICD-10-CM | POA: Diagnosis not present

## 2023-08-25 DIAGNOSIS — D839 Common variable immunodeficiency, unspecified: Secondary | ICD-10-CM | POA: Diagnosis not present

## 2023-08-31 ENCOUNTER — Ambulatory Visit (INDEPENDENT_AMBULATORY_CARE_PROVIDER_SITE_OTHER): Payer: Medicare Other

## 2023-08-31 DIAGNOSIS — I495 Sick sinus syndrome: Secondary | ICD-10-CM

## 2023-09-01 DIAGNOSIS — D839 Common variable immunodeficiency, unspecified: Secondary | ICD-10-CM | POA: Diagnosis not present

## 2023-09-02 LAB — CUP PACEART REMOTE DEVICE CHECK
Battery Remaining Longevity: 140 mo
Battery Voltage: 3.03 V
Brady Statistic AP VP Percent: 0.08 %
Brady Statistic AP VS Percent: 96.78 %
Brady Statistic AS VP Percent: 0 %
Brady Statistic AS VS Percent: 3.14 %
Brady Statistic RA Percent Paced: 96.88 %
Brady Statistic RV Percent Paced: 0.08 %
Date Time Interrogation Session: 20250304020136
Implantable Lead Connection Status: 753985
Implantable Lead Connection Status: 753985
Implantable Lead Implant Date: 20130319
Implantable Lead Implant Date: 20130319
Implantable Lead Location: 753859
Implantable Lead Location: 753860
Implantable Pulse Generator Implant Date: 20230306
Lead Channel Impedance Value: 361 Ohm
Lead Channel Impedance Value: 418 Ohm
Lead Channel Impedance Value: 437 Ohm
Lead Channel Impedance Value: 475 Ohm
Lead Channel Pacing Threshold Amplitude: 0.5 V
Lead Channel Pacing Threshold Amplitude: 0.875 V
Lead Channel Pacing Threshold Pulse Width: 0.4 ms
Lead Channel Pacing Threshold Pulse Width: 0.4 ms
Lead Channel Sensing Intrinsic Amplitude: 1.375 mV
Lead Channel Sensing Intrinsic Amplitude: 1.375 mV
Lead Channel Sensing Intrinsic Amplitude: 19.75 mV
Lead Channel Sensing Intrinsic Amplitude: 19.75 mV
Lead Channel Setting Pacing Amplitude: 1.5 V
Lead Channel Setting Pacing Amplitude: 2 V
Lead Channel Setting Pacing Pulse Width: 0.4 ms
Lead Channel Setting Sensing Sensitivity: 0.9 mV
Zone Setting Status: 755011
Zone Setting Status: 755011

## 2023-09-06 ENCOUNTER — Other Ambulatory Visit: Payer: Self-pay | Admitting: Internal Medicine

## 2023-09-06 DIAGNOSIS — E039 Hypothyroidism, unspecified: Secondary | ICD-10-CM

## 2023-09-08 DIAGNOSIS — D839 Common variable immunodeficiency, unspecified: Secondary | ICD-10-CM | POA: Diagnosis not present

## 2023-09-12 ENCOUNTER — Other Ambulatory Visit: Payer: Self-pay | Admitting: Cardiovascular Disease

## 2023-09-15 DIAGNOSIS — D839 Common variable immunodeficiency, unspecified: Secondary | ICD-10-CM | POA: Diagnosis not present

## 2023-09-22 DIAGNOSIS — D839 Common variable immunodeficiency, unspecified: Secondary | ICD-10-CM | POA: Diagnosis not present

## 2023-09-29 DIAGNOSIS — D839 Common variable immunodeficiency, unspecified: Secondary | ICD-10-CM | POA: Diagnosis not present

## 2023-10-04 NOTE — Progress Notes (Signed)
 Remote pacemaker transmission.

## 2023-10-04 NOTE — Addendum Note (Signed)
 Addended by: Geralyn Flash D on: 10/04/2023 03:45 PM   Modules accepted: Orders

## 2023-10-06 DIAGNOSIS — D839 Common variable immunodeficiency, unspecified: Secondary | ICD-10-CM | POA: Diagnosis not present

## 2023-10-10 ENCOUNTER — Other Ambulatory Visit: Payer: Self-pay | Admitting: Cardiovascular Disease

## 2023-10-13 DIAGNOSIS — D839 Common variable immunodeficiency, unspecified: Secondary | ICD-10-CM | POA: Diagnosis not present

## 2023-10-14 ENCOUNTER — Other Ambulatory Visit: Payer: Self-pay | Admitting: Internal Medicine

## 2023-10-14 DIAGNOSIS — G6289 Other specified polyneuropathies: Secondary | ICD-10-CM

## 2023-10-14 NOTE — Telephone Encounter (Signed)
 Copied from CRM [id number]. Topic: Clinical - Medication Refill >> Oct 14, 2023 11:01 AM Allyne Areola wrote: Most Recent Primary Care Visit:  Provider: Arcadio Knuckles  Department: [facility]  Visit Type: OFFICE VISIT  Date: 11/18/2022  Medication: gabapentin (NEURONTIN) 300 MG capsule, Patient states she needs a refill on all her medications.   Has the patient contacted their pharmacy? Yes, asked patient to contact ordering provider (Agent: If no, request that the patient contact the pharmacy for the refill. If patient does not wish to contact the pharmacy document the reason why and proceed with request.) (Agent: If yes, when and what did the pharmacy advise?)  Is this the correct pharmacy for this prescription? Yes If no, delete pharmacy and type the correct one.  This is the patient's preferred pharmacy:  [facility] [address] [place], [place] - [address] AT [facility] [address] [place] [address] Phone: [phone] Fax: [phone]   Has the prescription been filled recently? No  Is the patient out of the medication? Yes  Has the patient been seen for an appointment in the last year OR does the patient have an upcoming appointment? Yes  Can we respond through MyChart? Yes  Agent: Please be advised that Rx refills may take up to 3 business days. We ask that you follow-up with your pharmacy.

## 2023-10-20 DIAGNOSIS — D839 Common variable immunodeficiency, unspecified: Secondary | ICD-10-CM | POA: Diagnosis not present

## 2023-10-26 ENCOUNTER — Other Ambulatory Visit: Payer: Self-pay

## 2023-10-26 ENCOUNTER — Other Ambulatory Visit: Payer: Self-pay | Admitting: Cardiovascular Disease

## 2023-10-26 MED ORDER — METOPROLOL TARTRATE 50 MG PO TABS: 50.0000 mg | ORAL_TABLET | Freq: Two times a day (BID) | ORAL | 0 refills | Status: DC

## 2023-10-26 MED ORDER — AMLODIPINE BESYLATE 5 MG PO TABS: ORAL_TABLET | ORAL | 0 refills | Status: DC

## 2023-10-26 MED ORDER — EMPAGLIFLOZIN 10 MG PO TABS: 10.0000 mg | ORAL_TABLET | Freq: Every day | ORAL | 0 refills | Status: DC

## 2023-10-26 MED ORDER — LISINOPRIL 10 MG PO TABS: 10.0000 mg | ORAL_TABLET | Freq: Every day | ORAL | 0 refills | Status: DC

## 2023-10-26 MED ORDER — ATORVASTATIN CALCIUM 40 MG PO TABS: 40.0000 mg | ORAL_TABLET | Freq: Every day | ORAL | 0 refills | Status: DC

## 2023-10-26 NOTE — Telephone Encounter (Signed)
*  STAT* If patient is at the pharmacy, call can be transferred to refill team.   1. Which medications need to be refilled? (please list name of each medication and dose if known)  Metoprolol , Lisinopril , Atorvastatin  , Amlodipine  and wants generic for Jardiance  if possible please   2. Would you like to learn more about the convenience, safety, & potential cost savings by using the [facility]?      3. Are you open to using the [facility] (Type [facility]. .   4. Which pharmacy/location (including street and city if local pharmacy) is medication to be sent to?[facility]  [address] [place]   5. Do they need a 30 day or 90 day supply? Enough until her appointment on 11-18-23

## 2023-10-26 NOTE — Telephone Encounter (Signed)
 Pt of Dr. Alvis Ba is wondering if there's a generic to Jardiance  that we can offer. Cost issue.

## 2023-10-27 DIAGNOSIS — D839 Common variable immunodeficiency, unspecified: Secondary | ICD-10-CM | POA: Diagnosis not present

## 2023-10-29 NOTE — Telephone Encounter (Signed)
 There is a generic dapagliflozin that may or may not be cheaper.  It is actually manufactured by the same company and sold for [employer] patients with a cheaper wholesale price, but it depends on her insurance plan whether or not it would be cheaper.  Okay to prescribe dapagliflozin 10 mg once daily.

## 2023-11-02 NOTE — Telephone Encounter (Signed)
 Spoke with the patient. She said that the pharmacy could not "find" the coupon and they ended up giving her 30 days of the medication.  Tried to sign her up for the jardiance  savings card, but the system is currently down.   She said she will just try to see if the "card" helps- will try for the card/coupon later. Told her to call us  if she would like to change to generic or apply for patient assistance. She verbalized understanding

## 2023-11-03 DIAGNOSIS — D839 Common variable immunodeficiency, unspecified: Secondary | ICD-10-CM | POA: Diagnosis not present

## 2023-11-09 ENCOUNTER — Other Ambulatory Visit: Payer: Self-pay | Admitting: Internal Medicine

## 2023-11-09 DIAGNOSIS — G6289 Other specified polyneuropathies: Secondary | ICD-10-CM

## 2023-11-09 NOTE — Telephone Encounter (Unsigned)
 Copied from CRM [id number]. Topic: Clinical - Medication Refill >> Nov 09, 2023  4:07 PM Adaysia C wrote: Medication: gabapentin  (NEURONTIN ) 300 MG capsule  Has the patient contacted their pharmacy? Yes, patients pharmacy instructed patient to contact providers office to initiate RX refill request  (Agent: If no, request that the patient contact the pharmacy for the refill. If patient does not wish to contact the pharmacy document the reason why and proceed with request.) (Agent: If yes, when and what did the pharmacy advise?)  This is the patient's preferred pharmacy:  [id number] [place], [place] - [address] AT [facility] [address] [place] [address] Phone: [phone] Fax: [phone]  Is this the correct pharmacy for this prescription? Yes If no, delete pharmacy and type the correct one.   Has the prescription been filled recently? No  Is the patient out of the medication? No, patient has 2 pills left  Has the patient been seen for an appointment in the last year OR does the patient have an upcoming appointment? Yes  Can we respond through MyChart? No  Agent: Please be advised that Rx refills may take up to 3 business days. We ask that you follow-up with your pharmacy.

## 2023-11-10 DIAGNOSIS — D839 Common variable immunodeficiency, unspecified: Secondary | ICD-10-CM | POA: Diagnosis not present

## 2023-11-11 ENCOUNTER — Other Ambulatory Visit: Payer: Self-pay | Admitting: Cardiovascular Disease

## 2023-11-15 DIAGNOSIS — M791 Myalgia, unspecified site: Secondary | ICD-10-CM | POA: Diagnosis not present

## 2023-11-15 DIAGNOSIS — G894 Chronic pain syndrome: Secondary | ICD-10-CM | POA: Diagnosis not present

## 2023-11-17 ENCOUNTER — Other Ambulatory Visit: Payer: Self-pay | Admitting: Internal Medicine

## 2023-11-17 DIAGNOSIS — G6289 Other specified polyneuropathies: Secondary | ICD-10-CM

## 2023-11-17 DIAGNOSIS — D839 Common variable immunodeficiency, unspecified: Secondary | ICD-10-CM | POA: Diagnosis not present

## 2023-11-17 NOTE — Telephone Encounter (Signed)
 Copied from CRM [id number]. Topic: Clinical - Medication Refill >> Nov 17, 2023  4:01 PM Adaysia C wrote: Medication: gabapentin  (NEURONTIN ) 300 MG capsule  Has the patient contacted their pharmacy? Yes, patients pharmacy instructed patient to contact providers office to initiate Rx refill (Agent: If no, request that the patient contact the pharmacy for the refill. If patient does not wish to contact the pharmacy document the reason why and proceed with request.) (Agent: If yes, when and what did the pharmacy advise?)  This is the patient's preferred pharmacy:  [facility] STORE #[id number] [place], [place] - [address] AT [facility] [address] [place] [address] Phone: [phone] Fax: [phone]  Is this the correct pharmacy for this prescription? Yes If no, delete pharmacy and type the correct one.   Has the prescription been filled recently? No  Is the patient out of the medication? Yes, patient has been with out medication for 2 weeks  Has the patient been seen for an appointment in the last year OR does the patient have an upcoming appointment? Yes  Can we respond through MyChart? No  Agent: Please be advised that Rx refills may take up to 3 business days. We ask that you follow-up with your pharmacy.

## 2023-11-18 ENCOUNTER — Ambulatory Visit: Attending: Cardiovascular Disease | Admitting: Cardiovascular Disease

## 2023-11-18 ENCOUNTER — Encounter: Payer: Self-pay | Admitting: Cardiovascular Disease

## 2023-11-18 VITALS — BP 130/72 | HR 77 | Ht 62.0 in | Wt 127.4 lb

## 2023-11-18 DIAGNOSIS — I10 Essential (primary) hypertension: Secondary | ICD-10-CM | POA: Diagnosis not present

## 2023-11-18 DIAGNOSIS — Z961 Presence of intraocular lens: Secondary | ICD-10-CM | POA: Diagnosis not present

## 2023-11-18 DIAGNOSIS — I5032 Chronic diastolic (congestive) heart failure: Secondary | ICD-10-CM | POA: Diagnosis not present

## 2023-11-18 DIAGNOSIS — I951 Orthostatic hypotension: Secondary | ICD-10-CM | POA: Diagnosis not present

## 2023-11-18 DIAGNOSIS — I495 Sick sinus syndrome: Secondary | ICD-10-CM | POA: Diagnosis not present

## 2023-11-18 DIAGNOSIS — I4719 Other supraventricular tachycardia: Secondary | ICD-10-CM

## 2023-11-18 DIAGNOSIS — Z950 Presence of cardiac pacemaker: Secondary | ICD-10-CM | POA: Diagnosis not present

## 2023-11-18 DIAGNOSIS — H532 Diplopia: Secondary | ICD-10-CM | POA: Diagnosis not present

## 2023-11-18 LAB — CUP PACEART INCLINIC DEVICE CHECK
Battery Remaining Longevity: 136 mo
Battery Voltage: 3.02 V
Brady Statistic AP VP Percent: 0.06 %
Brady Statistic AP VS Percent: 93.22 %
Brady Statistic AS VP Percent: 0 %
Brady Statistic AS VS Percent: 6.71 %
Brady Statistic RA Percent Paced: 93.28 %
Brady Statistic RV Percent Paced: 0.07 %
Date Time Interrogation Session: 20250522093717
Implantable Lead Connection Status: 753985
Implantable Lead Connection Status: 753985
Implantable Lead Implant Date: 20130319
Implantable Lead Implant Date: 20130319
Implantable Lead Location: 753859
Implantable Lead Location: 753860
Implantable Pulse Generator Implant Date: 20230306
Lead Channel Impedance Value: 342 Ohm
Lead Channel Impedance Value: 399 Ohm
Lead Channel Impedance Value: 418 Ohm
Lead Channel Impedance Value: 456 Ohm
Lead Channel Pacing Threshold Amplitude: 0.75 V
Lead Channel Pacing Threshold Amplitude: 1 V
Lead Channel Pacing Threshold Pulse Width: 0.4 ms
Lead Channel Pacing Threshold Pulse Width: 0.4 ms
Lead Channel Sensing Intrinsic Amplitude: 1.375 mV
Lead Channel Sensing Intrinsic Amplitude: 22.125 mV
Lead Channel Setting Pacing Amplitude: 1.5 V
Lead Channel Setting Pacing Amplitude: 2 V
Lead Channel Setting Pacing Pulse Width: 0.4 ms
Lead Channel Setting Sensing Sensitivity: 0.9 mV
Zone Setting Status: 755011
Zone Setting Status: 755011

## 2023-11-18 MED ORDER — AMLODIPINE BESYLATE 2.5 MG PO TABS: 2.5000 mg | ORAL_TABLET | Freq: Every day | ORAL | 3 refills | Status: AC

## 2023-11-18 NOTE — Patient Instructions (Signed)
 Medication Instructions:  Your physician has recommended you make the following change in your medication:  DECREASE AMLODIPINE  2.5 MG  DAILY.   *If you need a refill on your cardiac medications before your next appointment, please call your pharmacy*  Lab Work: NONE If you have labs (blood work) drawn today and your tests are completely normal, you will receive your results only by: MyChart Message (if you have MyChart) OR A paper copy in the mail If you have any lab test that is abnormal or we need to change your treatment, we will call you to review the results.  Testing/Procedures: NONE  Follow-Up: At [facility], you and your health needs are our priority.  As part of our continuing mission to provide you with exceptional heart care, our providers are all part of one team.  This team includes your primary Cardiologist (physician) and Advanced Practice Providers or APPs (Physician Assistants and Nurse Practitioners) who all work together to provide you with the care you need, when you need it.  Your next appointment:   1 year(s)  Provider:   You may see DR. CROITORU or one of the following Advanced Practice Providers on your designated Care Team:   Mertha Abrahams, Kennard Pea [address] [place]aver[place], PA-C Suzann Riddle, NP Creighton Doffing, NP    We recommend signing up for the patient portal called "MyChart".  Sign up information is provided on this After Visit Summary.  MyChart is used to connect with patients for Virtual Visits (Telemedicine).  Patients are able to view lab/test results, encounter notes, upcoming appointments, etc.  Non-urgent messages can be sent to your provider as well.   To learn more about what you can do with MyChart, go to [email or web address].

## 2023-11-18 NOTE — Progress Notes (Signed)
 Cardiology Office Note    Date:  11/18/2023   ID:  Amrie, [name], MRN [id number]  PCP:  Arcadio Knuckles, MD  Cardiologist:   Luana Rumple, MD   Chief Complaint  Patient presents with   Dizziness    History of Present Illness:  [name] is a 89 y.o. 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    She does have NYHA functional class II shortness of breath, but was able to walk the long hallway in our new office without any difficulty today.  Has not had issues with lower extremity edema and has not had to take her "as needed" furosemide  in the last year.  In the past we have had difficulty balancing chronic diastolic heart failure with orthostatic hypotension, but currently this is not a big problem.  She did have some shortness of breath when she was at a beach vacation, improved when she returned home.  She has been checking her blood pressure at home and it is typically right around 130/70 which is what it is today.  She has not had major changes in her weight that she is acknowledged, but she is actually about 7 pounds lighter than she was at our last office appointment  She has episodes of dizziness that occur on an intermittent basis, but these are brought on by simply turning around, not necessarily associated with standing up.  They are more suggestive of vestibular etiology than they would be of orthostatic hypotension.  We were unable to demonstrate any orthostatic hypotension in the office today.  She also has some tinnitus which she describes as a swishing sensation in her ears and uses hearing aids.  She denies chest pain, orthopnea, PND, palpitations, true syncope or focal neurological  complaints.  She underwent RF ablation in the lumbar spine area for chronic pain.  She has significant lumbar spine scoliosis.  Last echocardiogram in June 2022 showed normal left ventricular systolic function but does show evidence of significant diastolic dysfunction with pseudonormal mitral inflow and elevated left heart filling pressures.  She continues to self administer Hizentra  subcutaneous injections weekly.  Pacemaker interrogation in the office today shows normal device function.  She has a dual-chamber Medtronic Azure XT DR MRI device implanted as a generator change out in 2023.  Both the atrial and ventricular leads are 5086 MRI conditional leads.  All lead parameters are normal range and unchanged from chronic levels.  Estimated generator longevity is just over 11 years.  Presenting rhythm is atrial paced, ventricular sensed.  She has 93% atrial pacing and does not require ventricular pacing ever.  She has had 8 very brief episodes of high ventricular rates.  2 of these are brief nonsustained ventricular tachycardia, most of them are brief paroxysmal atrial tachycardia with 1: 1 AV conduction.  Activity level has been quite steady at around 2 hours/day.  She is not pacemaker dependent.  Her underlying rhythm is sinus bradycardia at around 48 bpm.  She previously had paroxysmal atrial fibrillation immediately after implantation of her first pacemaker in 2013, probably due to lead microperforation related pericarditis.  Since then she has had extremely rare and very brief episodes of atrial arrhythmia, for example a 46-second episode that occurred last year.  She is not on anticoagulants.   Shortly after initial pacemaker implantation  in 2013, she had problems with paroxysmal atrial fibrillation likely related to microperforation and acute pericarditis.  For many years after she has not had recurrent atrial fibrillation. She did have some shortness of breath in 2018 that responded promptly to  diuretics. September 2019, she had a single syncopal events shortly after arriving in [place] after a long airline trip. Echo showed normal left ventricular systolic function but evidence of diastolic dysfunction and increased filling pressure.  She developed problems with hypervolemia while receiving IVIG (Hizentra ) infusions, improved when this was switched to the low volume subcutaneous injections.  Periodically she has episodes of hypotension where we have to discontinue all of her antihypertensive medications, but after a few weeks gradually returns to have markedly elevated blood pressure and we restarted her medications.   Past Medical History:  Diagnosis Date   Anemia    pt denies   Arthritis    Atrial fibrillation (HCC)    Cataract    bil   Common variable immunodeficiency (HCC)    Cystocele    history with rectocele, pt denies   GERD (gastroesophageal reflux disease)    H/O hiatal hernia    Hypertension    Hypothyroidism    Non Hodgkin's lymphoma (HCC)    dx 2012 and now in remission   Osteoporosis    Pacemaker 09/15/2011   Medtronic Revo   Pericarditis    Pleural effusion, bilateral    history of   Pulmonary nodules    history of   Scoliosis    Varicose vein     Past Surgical History:  Procedure Laterality Date   BREAST SURGERY  07/30/01   biopsy x 3, bilateral   CATARACT EXTRACTION Bilateral    COLONOSCOPY     ESOPHAGOGASTRODUODENOSCOPY (EGD) WITH PROPOFOL  N/A 12/10/2020   Procedure: ESOPHAGOGASTRODUODENOSCOPY (EGD) WITH PROPOFOL ;  Surgeon: Genell Ken, MD;  Location: [facility] ENDOSCOPY;  Service: Gastroenterology;  Laterality: N/A;  NG Tube Insertion   MASS EXCISION Right 10/28/2012   Procedure: Removal of mass on neck       ;  Surgeon: Eddye Goodie, MD;  Location: [facility] OR;  Service: General;  Laterality: Right;   PARTIAL HYSTERECTOMY  04/29/2004   PERMANENT PACEMAKER INSERTION N/A 09/15/2011   Procedure: PERMANENT PACEMAKER INSERTION;  Surgeon: Luana Rumple, MD;   Location: [facility] CATH LAB;  Service: Cardiovascular;  Laterality: N/A;   PPM GENERATOR CHANGEOUT N/A 09/01/2021   Procedure: PPM GENERATOR CHANGEOUT;  Surgeon: Luana Rumple, MD;  Location: [facility] INVASIVE CV LAB;  Service: Cardiovascular;  Laterality: N/A;   RADIOACTIVE SEED GUIDED EXCISIONAL BREAST BIOPSY Left 08/11/2017   Procedure: LEFT RADIOACTIVE SEED GUIDED EXCISIONAL BREAST BIOPSY ERAS PATHWAY;  Surgeon: Lockie Rima, MD;  Location: [facility] OR;  Service: General;  Laterality: Left;   TONSILLECTOMY      Current Medications: Outpatient Medications Prior to Visit  Medication Sig Dispense Refill   acetaminophen  (TYLENOL ) 500 MG tablet Take 500 mg by mouth every 6 (six) hours as needed (for pain.).      amitriptyline  (ELAVIL ) 10 MG tablet Take 10 mg by mouth at bedtime.     atorvastatin  (LIPITOR) 40 MG tablet Take 1 tablet (40 mg total) by mouth daily in the afternoon. 90 tablet 0   Calcium  Carbonate-Vitamin D  600-400 MG-UNIT tablet Take 1 tablet by mouth daily.     dexlansoprazole (DEXILANT) 60 MG capsule Take 60 mg by mouth daily.     empagliflozin  (JARDIANCE ) 10 MG TABS tablet Take 1 tablet (10 mg total) by mouth  daily. 90 tablet 0   EPINEPHrine  0.3 mg/0.3 mL IJ SOAJ injection Inject 0.3 mg into the muscle as needed for anaphylaxis.     ferrous fumarate  (HEMOCYTE - 106 MG FE) 325 (106 Fe) MG TABS tablet Take 1 tablet by mouth daily.     furosemide  (LASIX ) 20 MG tablet TAKE 1 TABLET BY MOUTH EVERY DAY AS NEEDED FOR A WEIGHT OVER 140LBS 30 tablet 1   gabapentin  (NEURONTIN ) 300 MG capsule TAKE 1 CAPSULE(300 MG) BY MOUTH FOUR TIMES DAILY 360 capsule 0   Immune Globulin , Human, (HIZENTRA ) 4 GM/20ML SOLN Inject 8 g into the skin once a week. Thursday's     lisinopril  (ZESTRIL ) 10 MG tablet TAKE 1 TABLET(10 MG) BY MOUTH DAILY 30 tablet 0   Magnesium  Oxide (MAG-OX 400 PO) Take 400 mg by mouth 2 (two) times a week.     metoprolol  tartrate (LOPRESSOR ) 50 MG tablet TAKE 1 TABLET(50 MG) BY MOUTH TWICE DAILY 60  tablet 0   omeprazole  (PRILOSEC) 40 MG capsule TAKE 1 CAPSULE(40 MG) BY MOUTH DAILY (Patient taking differently: Take 40 mg by mouth daily.) 90 capsule 0   potassium chloride  (KLOR-CON ) 10 MEQ tablet TAKE 1 TABLET(10 MEQ) BY MOUTH DAILY (Patient taking differently: Take 10 mEq by mouth daily.) 90 tablet 0   thiamine  (VITAMIN B1) 100 MG tablet TAKE 1 TABLET(100 MG) BY MOUTH DAILY 90 tablet 0   UNITHROID  100 MCG tablet TAKE 1 TABLET(100 MCG) BY MOUTH DAILY BEFORE BREAKFAST 90 tablet 0   amLODipine  (NORVASC ) 5 MG tablet Take 1/2 tablet ( 2.5 mg ) daily 180 tablet 0   No facility-administered medications prior to visit.     Allergies:   Tramadol , Amoxicillin, Buprenorphine, Codeine, Demerol, Dextromethorphan -guaifenesin , Morphine and codeine, Sulfonamide derivatives, and Vicodin [hydrocodone-acetaminophen ]   Family History:  The patient's family history includes Breast cancer in her sister; Heart disease in her brother and daughter; Lymphoma in her daughter; Stroke in her father and mother.   ROS:   Please see the history of present illness.    ROS All other systems reviewed and are negative.   PHYSICAL EXAM:   VS:  BP 130/72 (BP Location: Left Arm, Patient Position: Sitting)   Pulse 77   Ht 5\' 2"  (1.575 m)   Wt 57.8 kg   SpO2 (!) 84%   BMI 23.30 kg/m       General: Alert, oriented x3, no distress, appears lean and fit.  Pacemaker site is healthy. Head: no evidence of trauma, PERRL, EOMI, no exophtalmos or lid lag, no myxedema, no xanthelasma; normal ears, nose and oropharynx Neck: normal jugular venous pulsations and no hepatojugular reflux; brisk carotid pulses without delay and no carotid bruits Chest: clear to auscultation, no signs of consolidation by percussion or palpation, normal fremitus, symmetrical and full respiratory excursions Cardiovascular: normal position and quality of the apical impulse, regular rhythm, normal first and second heart sounds, no murmurs, rubs or  gallops Abdomen: no tenderness or distention, no masses by palpation, no abnormal pulsatility or arterial bruits, normal bowel sounds, no hepatosplenomegaly Extremities: no clubbing, cyanosis or edema; 2+ radial, ulnar and brachial pulses bilaterally; 2+ right femoral, posterior tibial and dorsalis pedis pulses; 2+ left femoral, posterior tibial and dorsalis pedis pulses; no subclavian or femoral bruits Neurological: grossly nonfocal Psych: Normal mood and affect    Wt Readings from Last 3 Encounters:  11/18/23 57.8 kg  08/12/23 58.6 kg  05/17/23 61.7 kg      Studies/Labs Reviewed:   EKG:  EKG Interpretation Date/Time:  Thursday Nov 18 2023 08:48:57 EDT Ventricular Rate:  77 PR Interval:  122 QRS Duration:  80 QT Interval:  388 QTC Calculation: 439 R Axis:   -43  Text Interpretation: Atrial-paced rhythm Left axis deviation Minimal voltage criteria for LVH, may be normal variant ( R in aVL ) When compared with ECG of 10-Dec-2020 03:27, No significant change since last tracing Confirmed by Nazyia Gaugh [phone]) on 11/18/2023 8:52:53 AM         recent Labs: 08/06/2023: ALT 16; BUN 15; Creatinine 0.72; Hemoglobin 14.5; Platelet Count 161; Potassium 4.0; Sodium 139  Lipid Panel     Component Value Date/Time   CHOL 186 10/27/2022 1537   CHOL 144 11/17/2018 1129   TRIG 217.0 (H) 10/27/2022 1537   HDL 39.00 (L) 10/27/2022 1537   HDL 41 11/17/2018 1129   CHOLHDL 5 10/27/2022 1537   VLDL 43.4 (H) 10/27/2022 1537   LDLCALC 96 06/03/2021 1054   LDLCALC 74 11/17/2018 1129   LDLDIRECT 125.0 10/27/2022 1537   LABVLDL 29 11/17/2018 1129     ASSESSMENT:    1. Chronic diastolic heart failure (HCC)   2. Orthostatic hypotension   3. Essential hypertension   4. SSS (sick sinus syndrome) (HCC)   5. Pacemaker   6. Paroxysmal atrial tachycardia (HCC)       PLAN:  In order of problems listed above:  CHF: Appears clinically euvolemic and has reasonably good functional  status NYHA class II.  Previously with estimated her "dry weight" to be less than 140 pounds, but she has lost some real weight and her dry weight may be less than 135 pounds at this point.  Echo in 2022 showed findings of pseudonormal mitral inflow pattern.  She takes Jardiance .  Unfortunately she seems to have a rather tight margin between hypovolemia and symptomatic hypervolemia.  Interestingly, her unexplained episodes of hypotension do not appear to correlate with her volume status.  No evidence of constrictive physiology on echo in 2018 or 2022. She has not received chest radiation therapy for lymphoma, just chemotherapy.  Consider repeat echo or even cardiac MRI if signs of heart failure worsen, to look for signs of constriction.  HTN: Fair control.  Avoid perfect blood pressure control since she has been prone to hypotension and falls.  She has been cutting the amlodipine  5 mg tablets in half.  Will give her a new prescription for the 2.5 mg tablets. SSS: Virtual always atrial paced rhythm, good heart rate histogram distribution with current sensor settings. PM: Device function.  Remote downloads every 3 months. PAT/PAF: Outside of the immediate postimplantation period of acute pericarditis, her episodes of atrial fibrillation have been extremely brief (never more than 90 seconds).  None seen during the current device check period.   History of post procedural acute pericarditis after pacemaker implantation, resolved. Had atrial fibrillation related to acute pericarditis, which has not recurred in significant amounts over the ensuing years.  Dizziness: No longer seems compatible with orthostatic hypotension.  Symptoms may be vestibular or neurological in etiology.  She also has some tinnitus.  Advised evaluation by ENT or neurology.  Medication Adjustments/Labs and Tests Ordered: Current medicines are reviewed at length with the patient today.  Concerns regarding medicines are outlined above.   Medication changes, Labs and Tests ordered today are listed in the Patient Instructions below. Patient Instructions  Medication Instructions:  Your physician has recommended you make the following change in your medication:  DECREASE AMLODIPINE  2.5 MG  DAILY.   *If you need a refill on your cardiac medications before your next appointment, please call your pharmacy*  Lab Work: NONE If you have labs (blood work) drawn today and your tests are completely normal, you will receive your results only by: MyChart Message (if you have MyChart) OR A paper copy in the mail If you have any lab test that is abnormal or we need to change your treatment, we will call you to review the results.  Testing/Procedures: NONE  Follow-Up: At [facility], you and your health needs are our priority.  As part of our continuing mission to provide you with exceptional heart care, our providers are all part of one team.  This team includes your primary Cardiologist (physician) and Advanced Practice Providers or APPs (Physician Assistants and Nurse Practitioners) who all work together to provide you with the care you need, when you need it.  Your next appointment:   1 year(s)  Provider:   You may see DR. Tarrie Mcmichen or one of the following Advanced Practice Providers on your designated Care Team:   Mertha Abrahams, Kennard Pea [address]" [place], PA-C Suzann Riddle, NP Creighton Doffing, NP    We recommend signing up for the patient portal called "MyChart".  Sign up information is provided on this After Visit Summary.  MyChart is used to connect with patients for Virtual Visits (Telemedicine).  Patients are able to view lab/test results, encounter notes, upcoming appointments, etc.  Non-urgent messages can be sent to your provider as well.   To learn more about what you can do with MyChart, go to [email or web address].           Signed, Luana Rumple, MD  11/18/2023 4:34 PM    [facility]  [facility] [address] [place], [place], [place]  [address] Phone: [phone]; Fax: [phone]

## 2023-11-23 ENCOUNTER — Other Ambulatory Visit: Payer: Self-pay | Admitting: Internal Medicine

## 2023-11-23 ENCOUNTER — Telehealth: Payer: Self-pay

## 2023-11-23 ENCOUNTER — Ambulatory Visit (INDEPENDENT_AMBULATORY_CARE_PROVIDER_SITE_OTHER)

## 2023-11-23 VITALS — Ht 62.0 in | Wt 127.0 lb

## 2023-11-23 DIAGNOSIS — G6289 Other specified polyneuropathies: Secondary | ICD-10-CM

## 2023-11-23 DIAGNOSIS — Z0000 Encounter for general adult medical examination without abnormal findings: Secondary | ICD-10-CM | POA: Diagnosis not present

## 2023-11-23 NOTE — Progress Notes (Signed)
 Subjective:   [name] is a 89 y.o. who presents for a Medicare Wellness preventive visit.  As a reminder, Annual Wellness Visits don't include a physical exam, and some assessments may be limited, especially if this visit is performed virtually. We may recommend an in-person follow-up visit with your provider if needed.  Visit Complete: Virtual I connected with  Tien M Ragsdale on 11/23/23 by a audio enabled telemedicine application and verified that I am speaking with the correct person using two identifiers.  Patient Location: Home  Provider Location: Office/Clinic  I discussed the limitations of evaluation and management by telemedicine. The patient expressed understanding and agreed to proceed.  Vital Signs: Because this visit was a virtual/telehealth visit, some criteria may be missing or patient reported. Any vitals not documented were not able to be obtained and vitals that have been documented are patient reported.  VideoDeclined- This patient declined Librarian, academic. Therefore the visit was completed with audio only.  Persons Participating in Visit: Patient.  AWV Questionnaire: No: Patient Medicare AWV questionnaire was not completed prior to this visit.  Cardiac Risk Factors include: advanced age (>5men, >58 women);dyslipidemia;hypertension     Objective:     Today's Vitals   11/23/23 1515  Weight: 127 lb (57.6 kg)  Height: 5\' 2"  (1.575 m)   Body mass index is 23.23 kg/m.     11/23/2023    3:11 PM 09/01/2021   11:42 AM 05/16/2021    9:55 AM 12/10/2020    3:00 AM 06/19/2019   10:51 AM 12/21/2017   11:03 AM 08/09/2017    2:19 PM  Advanced Directives  Does Patient Have a Medical Advance Directive? No No No No No No No  Would patient like information on creating a medical advance directive? No - Patient declined No - Patient declined  No - Patient declined   No - Patient declined    Current Medications  (verified) Outpatient Encounter Medications as of 11/23/2023  Medication Sig   acetaminophen  (TYLENOL ) 500 MG tablet Take 500 mg by mouth every 6 (six) hours as needed (for pain.).    amLODipine  (NORVASC ) 2.5 MG tablet Take 1 tablet (2.5 mg total) by mouth daily.   Calcium  Carbonate-Vitamin D  600-400 MG-UNIT tablet Take 1 tablet by mouth daily.   empagliflozin  (JARDIANCE ) 10 MG TABS tablet Take 1 tablet (10 mg total) by mouth daily.   EPINEPHrine  0.3 mg/0.3 mL IJ SOAJ injection Inject 0.3 mg into the muscle as needed for anaphylaxis.   ferrous fumarate  (HEMOCYTE - 106 MG FE) 325 (106 Fe) MG TABS tablet Take 1 tablet by mouth daily.   gabapentin  (NEURONTIN ) 300 MG capsule TAKE 1 CAPSULE(300 MG) BY MOUTH FOUR TIMES DAILY   Immune Globulin , Human, (HIZENTRA ) 4 GM/20ML SOLN Inject 8 g into the skin once a week. Thursday's   lisinopril  (ZESTRIL ) 10 MG tablet TAKE 1 TABLET(10 MG) BY MOUTH DAILY   Magnesium  Oxide (MAG-OX 400 PO) Take 400 mg by mouth 2 (two) times a week.   metoprolol  tartrate (LOPRESSOR ) 50 MG tablet TAKE 1 TABLET(50 MG) BY MOUTH TWICE DAILY   potassium chloride  (KLOR-CON ) 10 MEQ tablet TAKE 1 TABLET(10 MEQ) BY MOUTH DAILY (Patient taking differently: Take 10 mEq by mouth daily.)   thiamine  (VITAMIN B1) 100 MG tablet TAKE 1 TABLET(100 MG) BY MOUTH DAILY   UNITHROID  100 MCG tablet TAKE 1 TABLET(100 MCG) BY MOUTH DAILY BEFORE BREAKFAST   amitriptyline  (ELAVIL ) 10 MG tablet Take 10 mg by mouth at  bedtime. (Patient not taking: Reported on 11/23/2023)   atorvastatin  (LIPITOR) 40 MG tablet Take 1 tablet (40 mg total) by mouth daily in the afternoon. (Patient not taking: Reported on 11/23/2023)   dexlansoprazole (DEXILANT) 60 MG capsule Take 60 mg by mouth daily. (Patient not taking: Reported on 11/23/2023)   furosemide  (LASIX ) 20 MG tablet TAKE 1 TABLET BY MOUTH EVERY DAY AS NEEDED FOR A WEIGHT OVER 140LBS (Patient not taking: Reported on 11/23/2023)   omeprazole  (PRILOSEC) 40 MG capsule TAKE 1  CAPSULE(40 MG) BY MOUTH DAILY (Patient not taking: Reported on 11/23/2023)   No facility-administered encounter medications on file as of 11/23/2023.    Allergies (verified) Tramadol , Amoxicillin, Buprenorphine, Codeine, Demerol, Dextromethorphan -guaifenesin , Morphine and codeine, Sulfonamide derivatives, and Vicodin [hydrocodone-acetaminophen ]   History: Past Medical History:  Diagnosis Date   Anemia    pt denies   Arthritis    Atrial fibrillation (HCC)    Cataract    bil   Common variable immunodeficiency (HCC)    Cystocele    history with rectocele, pt denies   GERD (gastroesophageal reflux disease)    H/O hiatal hernia    Hypertension    Hypothyroidism    Non Hodgkin's lymphoma (HCC)    dx 2012 and now in remission   Osteoporosis    Pacemaker 09/15/2011   Medtronic Revo   Pericarditis    Pleural effusion, bilateral    history of   Pulmonary nodules    history of   Scoliosis    Varicose vein    Past Surgical History:  Procedure Laterality Date   BREAST SURGERY  07/30/01   biopsy x 3, bilateral   CATARACT EXTRACTION Bilateral    COLONOSCOPY     ESOPHAGOGASTRODUODENOSCOPY (EGD) WITH PROPOFOL  N/A 12/10/2020   Procedure: ESOPHAGOGASTRODUODENOSCOPY (EGD) WITH PROPOFOL ;  Surgeon: Genell Ken, MD;  Location[facility] ENDOSCOPY;  Service: Gastroenterology;  Laterality: N/A;  NG Tube Insertion   MASS EXCISION Right 10/28/2012   Procedure: Removal of mass on neck       ;  Surgeon: Eddye Goodie, MD;  Location: [facility] OR;  Service: General;  Laterality: Right;   PARTIAL HYSTERECTOMY  04/29/2004   PERMANENT PACEMAKER INSERTION N/A 09/15/2011   Procedure: PERMANENT PACEMAKER INSERTION;  Surgeon: Luana Rumple, MD;  Location: [facility] CATH LAB;  Service: Cardiovascular;  Laterality: N/A;   PPM GENERATOR CHANGEOUT N/A 09/01/2021   Procedure: PPM GENERATOR CHANGEOUT;  Surgeon: Luana Rumple, MD;  Location: [facility] INVASIVE CV LAB;  Service: Cardiovascular;  Laterality: N/A;   RADIOACTIVE SEED GUIDED  EXCISIONAL BREAST BIOPSY Left 08/11/2017   Procedure: LEFT RADIOACTIVE SEED GUIDED EXCISIONAL BREAST BIOPSY ERAS PATHWAY;  Surgeon: Lockie Rima, MD;  Location: [facility] OR;  Service: General;  Laterality: Left;   TONSILLECTOMY     Family History  Problem Relation Age of Onset   Stroke Mother    Stroke Father    Heart disease Daughter        congenital "whole in her heart"   Lymphoma Daughter        nonhodgkins   Breast cancer Sister    Heart disease Brother    Colon cancer Neg Hx    Esophageal cancer Neg Hx    Pancreatic cancer Neg Hx    Stomach cancer Neg Hx    Liver disease Neg Hx    Social History   Socioeconomic History   Marital status: Widowed    Spouse name: Not on file   Number of children: 3   Years of education:  Not on file   Highest education level: Not on file  Occupational History   Occupation: [employer]  Tobacco Use   Smoking status: Never    Passive exposure: Never   Smokeless tobacco: Never  Vaping Use   Vaping status: Never Used  Substance and Sexual Activity   Alcohol use: Yes    Comment: 1 glass of wine occasionally   Drug use: No   Sexual activity: Not Currently  Other Topics Concern   Not on file  Social History Narrative   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  She lives alone in a 3-story home.  There is a lift chair which she does not use (placed for her husband).      Right handed    Social Drivers of Health   Financial Resource Strain: Low Risk  (11/23/2023)   Overall Financial Resource Strain (CARDIA)    Difficulty of Paying Living Expenses: Not hard at all  Food Insecurity: No Food Insecurity (11/23/2023)   Hunger Vital Sign    Worried About Running Out of Food in the Last Year: Never true    Ran Out of Food in the Last Year: Never true  Transportation Needs: No Transportation Needs (11/23/2023)   PRAPARE - Administrator, Civil Service (Medical): No    Lack of  Transportation (Non-Medical): No  Physical Activity: Sufficiently Active (11/23/2023)   Exercise Vital Sign    Days of Exercise per Week: 5 days    Minutes of Exercise per Session: 60 min  Stress: No Stress Concern Present (11/23/2023)   [employer] of Occupational Health - Occupational Stress Questionnaire    Feeling of Stress : Not at all  Social Connections: Socially Isolated (11/23/2023)   Social Connection and Isolation Panel [NHANES]    Frequency of Communication with Friends and Family: More than three times a week    Frequency of Social Gatherings with Friends and Family: Twice a week    Attends Religious Services: Never    Database administrator or Organizations: No    Attends Banker Meetings: Never    Marital Status: Widowed    Tobacco Counseling Counseling given: No    Clinical Intake:  Pre-visit preparation completed: Yes  Pain : No/denies pain     BMI - recorded: 23.23 Nutritional Status: BMI of 19-24  Normal Nutritional Risks: None Diabetes: No  No results found for: "HGBA1C"   How often do you need to have someone help you when you read instructions, pamphlets, or other written materials from your doctor or pharmacy?: 1 - Never  Interpreter Needed?: No  Information entered by :: Kandy Orris, CMA   Activities of Daily Living     11/23/2023    3:18 PM  In your present state of health, do you have any difficulty performing the following activities:  Hearing? 0  Vision? 0  Difficulty concentrating or making decisions? 0  Walking or climbing stairs? 0  Dressing or bathing? 0  Doing errands, shopping? 0  Preparing Food and eating ? N  Using the Toilet? N  In the past six months, have you accidently leaked urine? N  Do you have problems with loss of bowel control? N  Managing your Medications? N  Managing your Finances? N  Housekeeping or managing your Housekeeping? N    Patient Care Team: Arcadio Knuckles, MD as PCP - General  (Internal Medicine) Luana Rumple, MD as PCP - Cardiology (  Cardiology) Shelah Derry, MD as Consulting Physician (Endocrinology) Marlene Simas, MD as Consulting Physician (Medical Oncology) Luana Rumple, MD as Consulting Physician (Cardiology) Patel, Donika K, DO as Consulting Physician (Neurology)  Indicate any recent Medical Services you may have received from other than [facility] providers in the past year (date may be approximate).     Assessment:    This is a routine wellness examination for Caryl.  Hearing/Vision screen Hearing Screening - Comments:: Denies hearing difficulties   Vision Screening - Comments:: Wears rx glasses - up to date with routine eye exams with [facility]   Goals Addressed               This Visit's Progress     Patient Stated (pt-stated)        Patient stated she plans to continue staying busy since working daily       Depression Screen     11/23/2023    3:19 PM 11/18/2022    9:47 AM 10/27/2022    2:50 PM 10/02/2020   10:56 AM 05/08/2020   12:10 PM 07/12/2018   12:59 PM 05/11/2017   10:38 AM  PHQ 2/9 Scores  PHQ - 2 Score 0 1 1 0 1 0 0  PHQ- 9 Score 0 1 1        Fall Risk     11/23/2023    3:18 PM 11/18/2022    9:47 AM 10/27/2022    2:50 PM 05/16/2021    9:47 AM 10/02/2020   10:56 AM  Fall Risk   Falls in the past year? 0 0 0 0 0  Number falls in past yr: 0 0 0 0 0  Injury with Fall? 0 0 0 0 0  Risk for fall due to : No Fall Risks No Fall Risks No Fall Risks    Follow up Falls evaluation completed;Falls prevention discussed Falls evaluation completed Falls evaluation completed      MEDICARE RISK AT HOME:  Medicare Risk at Home Any stairs in or around the home?: No If so, are there any without handrails?: No Home free of loose throw rugs in walkways, pet beds, electrical cords, etc?: Yes Adequate lighting in your home to reduce risk of falls?: Yes Life alert?: No Use of a cane, walker or w/c?: No Grab bars in the  bathroom?: Yes Shower chair or bench in shower?: Yes Elevated toilet seat or a handicapped toilet?: Yes  TIMED UP AND GO:  Was the test performed?  No  Cognitive Function: 6CIT completed        11/23/2023    3:21 PM  6CIT Screen  What Year? 0 points  What month? 0 points  What time? 0 points  Count back from 20 0 points  Months in reverse 0 points  Repeat phrase 0 points  Total Score 0 points    Immunizations Immunization History  Administered Date(s) Administered   Fluad Quad(high Dose 65+) 05/08/2020, 07/01/2021   Influenza Split 04/13/2012   Influenza Whole 07/04/2009, 03/25/2010   Influenza, High Dose Seasonal PF 03/31/2016   Influenza,inj,Quad PF,6+ Mos 04/27/2013, 03/14/2014, 04/19/2015   Influenza-Unspecified 12/12/2019   PFIZER(Purple Top)SARS-COV-2 Vaccination 08/25/2019, 09/20/2019   PNEUMOCOCCAL CONJUGATE-20 07/01/2021   Pneumococcal Conjugate-13 03/14/2014   Pneumococcal Polysaccharide-23 07/04/2009   Td 03/25/2010    Screening Tests Health Maintenance  Topic Date Due   Zoster Vaccines- Shingrix (1 of 2) Never done   COVID-19 Vaccine (3 - Pfizer risk series) 10/18/2019   INFLUENZA VACCINE  01/28/2024  Medicare Annual Wellness (AWV)  11/22/2024   Pneumonia Vaccine 40+ Years old  Completed   DEXA SCAN  Completed   HPV VACCINES  Aged Out   Meningococcal B Vaccine  Aged Out   DTaP/Tdap/Td  Discontinued    Health Maintenance  Health Maintenance Due  Topic Date Due   Zoster Vaccines- Shingrix (1 of 2) Never done   COVID-19 Vaccine (3 - Pfizer risk series) 10/18/2019   Health Maintenance Items Addressed: 11/23/2023   Additional Screening:  Vision Screening: Recommended annual ophthalmology exams for early detection of glaucoma and other disorders of the eye.  Dental Screening: Recommended annual dental exams for proper oral hygiene  Community Resource Referral / Chronic Care Management: CRR required this visit?  No   CCM required this  visit?  No   Plan:    I have personally reviewed and noted the following in the patient's chart:   Medical and social history Use of alcohol, tobacco or illicit drugs  Current medications and supplements including opioid prescriptions. Patient is not currently taking opioid prescriptions. Functional ability and status Nutritional status Physical activity Advanced directives List of other physicians Hospitalizations, surgeries, and ER visits in previous 12 months Vitals Screenings to include cognitive, depression, and falls Referrals and appointments  In addition, I have reviewed and discussed with patient certain preventive protocols, quality metrics, and best practice recommendations. A written personalized care plan for preventive services as well as general preventive health recommendations were provided to patient.   Patria Bookbinder, CMA   11/23/2023   After Visit Summary: (Declined) Due to this being a telephonic visit, with patients personalized plan was offered to patient but patient Declined AVS at this time   Notes: Scheduled an appt for the pt w/PCP for 12/20/2023 for a Physical

## 2023-11-23 NOTE — Patient Instructions (Addendum)
 [name] , Thank you for taking time out of your busy schedule to complete your Annual Wellness Visit with me. I enjoyed our conversation and look forward to speaking with you again next year. I, as well as your care team,  appreciate your ongoing commitment to your health goals. Please review the following plan we discussed and let me know if I can assist you in the future. Your Game plan/ To Do List Follow up Visits: Next Medicare AWV with our clinical staff: 11/27/2024   Have you seen your provider in the last 6 months (3 months if uncontrolled diabetes)? No Next Office Visit with your provider: 12/20/2023 - Physical   Clinician Recommendations:  Aim for 30 minutes of exercise or brisk walking, 6-8 glasses of water, and 5 servings of fruits and vegetables each day. Educated and advised on getting the Tdap (Tetenus), COVID, and Shingles vaccines at local pharmacy in 2025.      This is a list of the screening recommended for you and due dates:  Health Maintenance  Topic Date Due   Zoster (Shingles) Vaccine (1 of 2) Never done   COVID-19 Vaccine (3 - Pfizer risk series) 10/18/2019   Flu Shot  01/28/2024   Medicare Annual Wellness Visit  11/22/2024   Pneumonia Vaccine  Completed   DEXA scan (bone density measurement)  Completed   HPV Vaccine  Aged Out   Meningitis B Vaccine  Aged Out   DTaP/Tdap/Td vaccine  Discontinued    Advanced directives: (Declined) Advance directive discussed with you today. Even though you declined this today, please call our office should you change your mind, and we can give you the proper paperwork for you to fill out. Advance Care Planning is important because it:  [x]  Makes sure you receive the medical care that is consistent with your values, goals, and preferences  [x]  It provides guidance to your family and loved ones and reduces their decisional burden about whether or not they are making the right decisions based on your wishes.  Follow the link  provided in your after visit summary or read over the paperwork we have mailed to you to help you started getting your Advance Directives in place. If you need assistance in completing these, please reach out to us  so that we can help you!

## 2023-11-23 NOTE — Telephone Encounter (Signed)
 Called pt to schedule AWV-S, patient is requesting a refill on Gabapentin  be sent to [facility] on [address]. I advised patient I would send a request to clinical staff.

## 2023-11-24 ENCOUNTER — Encounter: Payer: Self-pay | Admitting: Internal Medicine

## 2023-11-24 ENCOUNTER — Ambulatory Visit (INDEPENDENT_AMBULATORY_CARE_PROVIDER_SITE_OTHER): Admitting: Internal Medicine

## 2023-11-24 ENCOUNTER — Ambulatory Visit (INDEPENDENT_AMBULATORY_CARE_PROVIDER_SITE_OTHER)

## 2023-11-24 ENCOUNTER — Ambulatory Visit: Payer: Self-pay | Admitting: Internal Medicine

## 2023-11-24 VITALS — BP 132/78 | HR 79 | Temp 98.2°F | Resp 16 | Ht 62.0 in | Wt 129.0 lb

## 2023-11-24 DIAGNOSIS — R059 Cough, unspecified: Secondary | ICD-10-CM | POA: Diagnosis not present

## 2023-11-24 DIAGNOSIS — R911 Solitary pulmonary nodule: Secondary | ICD-10-CM

## 2023-11-24 DIAGNOSIS — Z0000 Encounter for general adult medical examination without abnormal findings: Secondary | ICD-10-CM

## 2023-11-24 DIAGNOSIS — R053 Chronic cough: Secondary | ICD-10-CM

## 2023-11-24 DIAGNOSIS — Z0001 Encounter for general adult medical examination with abnormal findings: Secondary | ICD-10-CM

## 2023-11-24 DIAGNOSIS — I10 Essential (primary) hypertension: Secondary | ICD-10-CM | POA: Diagnosis not present

## 2023-11-24 DIAGNOSIS — R9389 Abnormal findings on diagnostic imaging of other specified body structures: Secondary | ICD-10-CM | POA: Diagnosis not present

## 2023-11-24 DIAGNOSIS — Z8572 Personal history of non-Hodgkin lymphomas: Secondary | ICD-10-CM | POA: Diagnosis not present

## 2023-11-24 DIAGNOSIS — G6289 Other specified polyneuropathies: Secondary | ICD-10-CM | POA: Diagnosis not present

## 2023-11-24 DIAGNOSIS — E039 Hypothyroidism, unspecified: Secondary | ICD-10-CM

## 2023-11-24 DIAGNOSIS — E785 Hyperlipidemia, unspecified: Secondary | ICD-10-CM

## 2023-11-24 DIAGNOSIS — Z950 Presence of cardiac pacemaker: Secondary | ICD-10-CM | POA: Diagnosis not present

## 2023-11-24 DIAGNOSIS — D839 Common variable immunodeficiency, unspecified: Secondary | ICD-10-CM | POA: Diagnosis not present

## 2023-11-24 LAB — VITAMIN B12: Vitamin B-12: 798 pg/mL (ref 211–911)

## 2023-11-24 LAB — LIPID PANEL
Cholesterol: 214 mg/dL — ABNORMAL HIGH (ref 0–200)
HDL: 56.1 mg/dL (ref >39.00)
LDL Cholesterol: 114 mg/dL — ABNORMAL HIGH (ref 0–99)
NonHDL: 157.7
Total CHOL/HDL Ratio: 4
Triglycerides: 221 mg/dL — ABNORMAL HIGH (ref 0.0–149.0)
VLDL: 44.2 mg/dL — ABNORMAL HIGH (ref 0.0–40.0)

## 2023-11-24 LAB — TSH: TSH: 5.49 u[IU]/mL (ref 0.35–5.50)

## 2023-11-24 MED ORDER — GABAPENTIN 300 MG PO CAPS: 300.0000 mg | ORAL_CAPSULE | Freq: Four times a day (QID) | ORAL | 1 refills | Status: DC

## 2023-11-24 MED ORDER — UNITHROID 100 MCG PO TABS: 100.0000 ug | ORAL_TABLET | Freq: Every day | ORAL | 1 refills | Status: DC

## 2023-11-24 NOTE — Progress Notes (Unsigned)
 Subjective:  Patient ID: [name], female    DOB: 05/04/1935  Age: 89 y.o. MRN: [id number]  CC: Medical Management of Chronic Issues (Overdue 6 month follow up. Medication refills. )   HPI [name] presents for f/up ----  Discussed the use of AI scribe software for clinical note transcription with the patient, who gave verbal consent to proceed.  History of Present Illness   [name] is an 89 year old female who presents for follow-up on her thyroid  medication and heart health.  She has been taking Unithroid  for thyroid  management, which was changed from Synthroid  over a year ago. Her thyroid  levels have not been checked in over a year. No symptoms of thyroid  dysfunction such as weight loss, fatigue, constipation, or sleep disturbances.  She experiences burning in her feet every night and day, affecting her mobility, suggestive of peripheral neuropathy. She is currently taking gabapentin  for this condition.  She reports a dry cough and dry mouth, particularly at night, but does not describe it as persistent. She has a history of pneumonia.  Her appetite is reportedly 'too good,' and she has gained two pounds in the past month. She previously had low vitamin B1 levels and has been taking a B1 supplement. She does not take vitamin B12 or folate but takes vitamins A, C, and D.       Outpatient Medications Prior to Visit  Medication Sig Dispense Refill   acetaminophen  (TYLENOL ) 500 MG tablet Take 500 mg by mouth every 6 (six) hours as needed (for pain.).      amLODipine  (NORVASC ) 2.5 MG tablet Take 1 tablet (2.5 mg total) by mouth daily. 180 tablet 3   Calcium  Carbonate-Vitamin D  600-400 MG-UNIT tablet Take 1 tablet by mouth daily.     empagliflozin  (JARDIANCE ) 10 MG TABS tablet Take 1 tablet (10 mg total) by mouth daily. 90 tablet 0   ferrous fumarate  (HEMOCYTE - 106 MG FE) 325 (106 Fe) MG TABS tablet Take 1 tablet by mouth daily.     Immune Globulin , Human,  (HIZENTRA ) 4 GM/20ML SOLN Inject 8 g into the skin once a week. Thursday's     lisinopril  (ZESTRIL ) 10 MG tablet TAKE 1 TABLET(10 MG) BY MOUTH DAILY 30 tablet 0   Magnesium  Oxide (MAG-OX 400 PO) Take 400 mg by mouth 2 (two) times a week.     metoprolol  tartrate (LOPRESSOR ) 50 MG tablet TAKE 1 TABLET(50 MG) BY MOUTH TWICE DAILY 60 tablet 0   potassium chloride  (KLOR-CON ) 10 MEQ tablet TAKE 1 TABLET(10 MEQ) BY MOUTH DAILY (Patient taking differently: Take 10 mEq by mouth daily.) 90 tablet 0   thiamine  (VITAMIN B1) 100 MG tablet TAKE 1 TABLET(100 MG) BY MOUTH DAILY 90 tablet 0   gabapentin  (NEURONTIN ) 300 MG capsule TAKE 1 CAPSULE(300 MG) BY MOUTH FOUR TIMES DAILY 360 capsule 0   UNITHROID  100 MCG tablet TAKE 1 TABLET(100 MCG) BY MOUTH DAILY BEFORE BREAKFAST 90 tablet 0   EPINEPHrine  0.3 mg/0.3 mL IJ SOAJ injection Inject 0.3 mg into the muscle as needed for anaphylaxis.     amitriptyline  (ELAVIL ) 10 MG tablet Take 10 mg by mouth at bedtime.     atorvastatin  (LIPITOR) 40 MG tablet Take 1 tablet (40 mg total) by mouth daily in the afternoon. 90 tablet 0   dexlansoprazole (DEXILANT) 60 MG capsule Take 60 mg by mouth daily.     furosemide  (LASIX ) 20 MG tablet TAKE 1 TABLET BY MOUTH EVERY DAY AS NEEDED FOR  A WEIGHT OVER 140LBS 30 tablet 1   omeprazole  (PRILOSEC) 40 MG capsule TAKE 1 CAPSULE(40 MG) BY MOUTH DAILY 90 capsule 0   No facility-administered medications prior to visit.    ROS Review of Systems  Constitutional:  Negative for appetite change, chills, diaphoresis, fatigue and fever.  HENT: Negative.    Eyes: Negative.   Respiratory:  Positive for cough. Negative for chest tightness, shortness of breath and wheezing.   Cardiovascular:  Negative for chest pain, palpitations and leg swelling.  Gastrointestinal: Negative.  Negative for abdominal pain, constipation, diarrhea, nausea and vomiting.  Endocrine: Negative.   Genitourinary: Negative.  Negative for difficulty urinating.   Musculoskeletal: Negative.   Skin: Negative.   Neurological: Negative.  Negative for dizziness and weakness.  Hematological:  Negative for adenopathy. Does not bruise/bleed easily.  Psychiatric/Behavioral: Negative.      Objective:  BP 132/78 (BP Location: Left Arm, Patient Position: Sitting, Cuff Size: Small)   Pulse 79   Temp 98.2 F (36.8 C) (Oral)   Resp 16   Ht 5\' 2"  (1.575 m)   Wt 129 lb (58.5 kg)   SpO2 94%   BMI 23.59 kg/m   BP Readings from Last 3 Encounters:  11/24/23 132/78  11/18/23 130/72  08/12/23 (!) 151/89    Wt Readings from Last 3 Encounters:  11/24/23 129 lb (58.5 kg)  11/23/23 127 lb (57.6 kg)  11/18/23 127 lb 6.4 oz (57.8 kg)    Physical Exam Vitals reviewed.  Constitutional:      Appearance: She is not ill-appearing.  HENT:     Mouth/Throat:     Mouth: Mucous membranes are moist.  Eyes:     General: No scleral icterus.    Conjunctiva/sclera: Conjunctivae normal.  Cardiovascular:     Rate and Rhythm: Normal rate and regular rhythm.     Heart sounds: No murmur heard.    No friction rub. No gallop.  Pulmonary:     Effort: Pulmonary effort is normal.     Breath sounds: No stridor. No wheezing, rhonchi or rales.  Abdominal:     General: Abdomen is flat.     Palpations: There is no mass.     Tenderness: There is no abdominal tenderness. There is no guarding.     Hernia: No hernia is present.  Musculoskeletal:        General: Normal range of motion.     Cervical back: Neck supple.     Thoracic back: Scoliosis present.     Right lower leg: No edema.     Left lower leg: No edema.  Lymphadenopathy:     Cervical: No cervical adenopathy.  Skin:    General: Skin is warm and dry.     Findings: No rash.  Neurological:     General: No focal deficit present.     Mental Status: She is alert. Mental status is at baseline.  Psychiatric:        Mood and Affect: Mood normal.        Behavior: Behavior normal.     Lab Results  Component Value  Date   WBC 7.6 08/06/2023   HGB 14.5 08/06/2023   HCT 43.8 08/06/2023   PLT 161 08/06/2023   GLUCOSE 80 08/06/2023   CHOL 214 (H) 11/24/2023   TRIG 221.0 (H) 11/24/2023   HDL 56.10 11/24/2023   LDLDIRECT 125.0 10/27/2022   LDLCALC 114 (H) 11/24/2023   ALT 16 08/06/2023   AST 25 08/06/2023   NA 139 08/06/2023  K 4.0 08/06/2023   CL 106 08/06/2023   CREATININE 0.72 08/06/2023   BUN 15 08/06/2023   CO2 30 08/06/2023   TSH 5.49 11/24/2023    CT CHEST ABDOMEN PELVIS W CONTRAST Result Date: 08/09/2023 CLINICAL DATA:  Follicular lymphoma restaging * Tracking Code: BO * EXAM: CT CHEST, ABDOMEN, AND PELVIS WITH CONTRAST TECHNIQUE: Multidetector CT imaging of the chest, abdomen and pelvis was performed following the standard protocol during bolus administration of intravenous contrast. RADIATION DOSE REDUCTION: This exam was performed according to the departmental dose-optimization program which includes automated exposure control, adjustment of the mA and/or kV according to patient size and/or use of iterative reconstruction technique. CONTRAST:  OMNIPAQUE  IOHEXOL  300 MG/ML SOLN additional oral enteric contrast COMPARISON:  PET-CT, 06/01/2023 FINDINGS: CT CHEST FINDINGS Cardiovascular: Left chest multilead pacer. Cardiomegaly. Scattered left coronary artery calcifications. Enlargement of the main pulmonary artery measuring up to 3.8 cm. No pericardial effusion. Mediastinum/Nodes: No enlarged mediastinal, hilar, or axillary lymph nodes. Large paraesophageal hernia containing the stomach and splenic flexure of the colon (series 2, image 38). Thyroid  gland, trachea, and esophagus demonstrate no significant findings. Lungs/Pleura: Unchanged nodular consolidation of the medial right lung base measuring 1.7 x 1.7 cm on today's examination, not previously PET avid and consistent with chronic scarring or atelectasis (series 4, image 107). Additional compressive atelectasis of the left lower lobe  secondary to large paraesophageal hernia. Unchanged small pulmonary nodules, largest a subpleural nodule of the anterior right upper lobe measuring 0.5 cm (series 4, image 67). No pleural effusion or pneumothorax. Musculoskeletal: No chest wall abnormality. No acute osseous findings. CT ABDOMEN PELVIS FINDINGS Hepatobiliary: No solid liver abnormality is seen. No gallstones, gallbladder wall thickening, or biliary dilatation. Pancreas: Unremarkable. No pancreatic ductal dilatation or surrounding inflammatory changes. Spleen: Normal in size without significant abnormality. Adrenals/Urinary Tract: Adrenal glands are unremarkable. Kidneys are normal, without renal calculi, solid lesion, or hydronephrosis. Bladder is unremarkable. Stomach/Bowel: Stomach is within normal limits. Appendix appears normal. No evidence of bowel wall thickening, distention, or inflammatory changes. Descending and sigmoid diverticulosis. Vascular/Lymphatic: Aortic atherosclerosis. No enlarged abdominal or pelvic lymph nodes. Reproductive: Status post hysterectomy. Other: No abdominal wall hernia or abnormality. No ascites. Musculoskeletal: No acute osseous findings. Severe levoscoliosis of the thoracolumbar spine associated disc degenerative disease Please note that definitively benign incidental findings, functional findings, and anatomic variants may have been intentionally omitted from this report in the interest of brevity and clarity. If not specifically noted and recommended in the impression, no further follow-up or characterization is required for incidental findings. IMPRESSION: 1. No enlarged lymph nodes of the chest, abdomen, or pelvis. 2. Unchanged nodular consolidation of the medial right lung base, not previously PET avid and consistent with chronic scarring or atelectasis. 3. Unchanged small pulmonary nodules, presumed benign. 4. Large paraesophageal hernia containing the stomach and splenic flexure of the colon. 5. Cardiomegaly  and coronary artery disease. 6. Enlargement of the main pulmonary artery, as can be seen in pulmonary hypertension. Aortic Atherosclerosis (ICD10-I70.0). Electronically Signed   By: Fredricka Jenny M.D.   On: 08/09/2023 21:58   DG Chest 2 View Result Date: 11/24/2023 CLINICAL DATA:  Dry cough.  History of lymphoma. EXAM: CHEST - 2 VIEW COMPARISON:  X-ray 07/01/2021. chest CT 08/06/2023 FINDINGS: Left upper chest pacemaker. Elevated left hemidiaphragm. Enlarged cardiopericardial silhouette. No consolidation, pneumothorax or effusion. No edema. Known large paraesophageal hernia involving stomach as well as other structures on the prior CT scan. IMPRESSION: Pacemaker. Enlarged cardiopericardial silhouette. Known large  hernia. Electronically Signed   By: Adrianna Horde M.D.   On: 11/24/2023 15:49     Assessment & Plan:  Acquired hypothyroidism -     Lipid panel; Future -     TSH; Future  Other polyneuropathy -     Gabapentin ; Take 1 capsule (300 mg total) by mouth 4 (four) times daily.  Dispense: 360 capsule; Refill: 1 -     Vitamin B12; Future -     Vitamin B1; Future  Dyslipidemia, LDL 136 -     Lipid panel; Future -     TSH; Future  Essential hypertension- Her BP is well controlled.  Lung nodule -     DG Chest 2 View; Future  Chronic cough -     DG Chest 2 View; Future  Hypothyroidism, unspecified type- She is euthyroid. -     Unithroid ; Take 1 tablet (100 mcg total) by mouth daily before breakfast.  Dispense: 90 tablet; Refill: 1  Encounter for general adult medical examination with abnormal findings - Exam completed, labs reviewed, vaccines reviewed, no cancer screenings indicated, pt ed material was given.      Follow-up: Return in about 6 months (around 05/26/2024).  Sandra Crouch, MD

## 2023-11-24 NOTE — Patient Instructions (Signed)

## 2023-11-24 NOTE — Telephone Encounter (Signed)
Medication will be refilled at her appointment

## 2023-11-26 DIAGNOSIS — R053 Chronic cough: Secondary | ICD-10-CM | POA: Insufficient documentation

## 2023-11-27 LAB — VITAMIN B1: Vitamin B1 (Thiamine): 74 nmol/L — ABNORMAL HIGH (ref 8–30)

## 2023-11-30 ENCOUNTER — Ambulatory Visit (INDEPENDENT_AMBULATORY_CARE_PROVIDER_SITE_OTHER): Payer: Medicare Other

## 2023-11-30 DIAGNOSIS — I495 Sick sinus syndrome: Secondary | ICD-10-CM | POA: Diagnosis not present

## 2023-11-30 LAB — CUP PACEART REMOTE DEVICE CHECK
Battery Remaining Longevity: 136 mo
Battery Voltage: 3.02 V
Brady Statistic AP VP Percent: 0.1 %
Brady Statistic AP VS Percent: 95.05 %
Brady Statistic AS VP Percent: 0 %
Brady Statistic AS VS Percent: 4.85 %
Brady Statistic RA Percent Paced: 95.14 %
Brady Statistic RV Percent Paced: 0.1 %
Date Time Interrogation Session: 20250602201546
Implantable Lead Connection Status: 753985
Implantable Lead Connection Status: 753985
Implantable Lead Implant Date: 20130319
Implantable Lead Implant Date: 20130319
Implantable Lead Location: 753859
Implantable Lead Location: 753860
Implantable Pulse Generator Implant Date: 20230306
Lead Channel Impedance Value: 361 Ohm
Lead Channel Impedance Value: 418 Ohm
Lead Channel Impedance Value: 418 Ohm
Lead Channel Impedance Value: 456 Ohm
Lead Channel Pacing Threshold Amplitude: 0.625 V
Lead Channel Pacing Threshold Amplitude: 0.875 V
Lead Channel Pacing Threshold Pulse Width: 0.4 ms
Lead Channel Pacing Threshold Pulse Width: 0.4 ms
Lead Channel Sensing Intrinsic Amplitude: 1.125 mV
Lead Channel Sensing Intrinsic Amplitude: 1.125 mV
Lead Channel Sensing Intrinsic Amplitude: 18.625 mV
Lead Channel Sensing Intrinsic Amplitude: 18.625 mV
Lead Channel Setting Pacing Amplitude: 1.5 V
Lead Channel Setting Pacing Amplitude: 2 V
Lead Channel Setting Pacing Pulse Width: 0.4 ms
Lead Channel Setting Sensing Sensitivity: 0.9 mV
Zone Setting Status: 755011
Zone Setting Status: 755011

## 2023-12-01 DIAGNOSIS — D839 Common variable immunodeficiency, unspecified: Secondary | ICD-10-CM | POA: Diagnosis not present

## 2023-12-02 ENCOUNTER — Ambulatory Visit: Payer: Self-pay | Admitting: Internal Medicine

## 2023-12-08 DIAGNOSIS — D839 Common variable immunodeficiency, unspecified: Secondary | ICD-10-CM | POA: Diagnosis not present

## 2023-12-13 DIAGNOSIS — L821 Other seborrheic keratosis: Secondary | ICD-10-CM | POA: Insufficient documentation

## 2023-12-14 DIAGNOSIS — I739 Peripheral vascular disease, unspecified: Secondary | ICD-10-CM | POA: Diagnosis not present

## 2023-12-14 DIAGNOSIS — I495 Sick sinus syndrome: Secondary | ICD-10-CM | POA: Diagnosis not present

## 2023-12-14 DIAGNOSIS — I5032 Chronic diastolic (congestive) heart failure: Secondary | ICD-10-CM | POA: Diagnosis not present

## 2023-12-14 DIAGNOSIS — I10 Essential (primary) hypertension: Secondary | ICD-10-CM | POA: Diagnosis not present

## 2023-12-14 DIAGNOSIS — I70219 Atherosclerosis of native arteries of extremities with intermittent claudication, unspecified extremity: Secondary | ICD-10-CM | POA: Diagnosis not present

## 2023-12-14 DIAGNOSIS — E785 Hyperlipidemia, unspecified: Secondary | ICD-10-CM | POA: Diagnosis not present

## 2023-12-14 DIAGNOSIS — I519 Heart disease, unspecified: Secondary | ICD-10-CM | POA: Diagnosis not present

## 2023-12-14 DIAGNOSIS — R001 Bradycardia, unspecified: Secondary | ICD-10-CM | POA: Diagnosis not present

## 2023-12-15 DIAGNOSIS — D839 Common variable immunodeficiency, unspecified: Secondary | ICD-10-CM | POA: Diagnosis not present

## 2023-12-20 ENCOUNTER — Encounter: Admitting: Internal Medicine

## 2023-12-22 DIAGNOSIS — D839 Common variable immunodeficiency, unspecified: Secondary | ICD-10-CM | POA: Diagnosis not present

## 2023-12-29 DIAGNOSIS — D839 Common variable immunodeficiency, unspecified: Secondary | ICD-10-CM | POA: Diagnosis not present

## 2024-01-05 DIAGNOSIS — D839 Common variable immunodeficiency, unspecified: Secondary | ICD-10-CM | POA: Diagnosis not present

## 2024-01-12 DIAGNOSIS — D839 Common variable immunodeficiency, unspecified: Secondary | ICD-10-CM | POA: Diagnosis not present

## 2024-01-13 DIAGNOSIS — H462 Nutritional optic neuropathy: Secondary | ICD-10-CM | POA: Diagnosis not present

## 2024-01-13 DIAGNOSIS — H5060 Mechanical strabismus, unspecified: Secondary | ICD-10-CM | POA: Diagnosis not present

## 2024-01-13 DIAGNOSIS — H47293 Other optic atrophy, bilateral: Secondary | ICD-10-CM | POA: Diagnosis not present

## 2024-01-19 DIAGNOSIS — D839 Common variable immunodeficiency, unspecified: Secondary | ICD-10-CM | POA: Diagnosis not present

## 2024-01-21 DIAGNOSIS — M1712 Unilateral primary osteoarthritis, left knee: Secondary | ICD-10-CM | POA: Diagnosis not present

## 2024-01-23 ENCOUNTER — Other Ambulatory Visit: Payer: Self-pay | Admitting: Cardiovascular Disease

## 2024-01-24 DIAGNOSIS — M545 Low back pain, unspecified: Secondary | ICD-10-CM | POA: Diagnosis not present

## 2024-01-24 DIAGNOSIS — M791 Myalgia, unspecified site: Secondary | ICD-10-CM | POA: Diagnosis not present

## 2024-01-24 DIAGNOSIS — G8929 Other chronic pain: Secondary | ICD-10-CM | POA: Diagnosis not present

## 2024-01-25 NOTE — Progress Notes (Signed)
 Remote pacemaker transmission.

## 2024-02-01 DIAGNOSIS — D839 Common variable immunodeficiency, unspecified: Secondary | ICD-10-CM | POA: Diagnosis not present

## 2024-02-08 DIAGNOSIS — D839 Common variable immunodeficiency, unspecified: Secondary | ICD-10-CM | POA: Diagnosis not present

## 2024-02-09 ENCOUNTER — Other Ambulatory Visit: Payer: Self-pay | Admitting: Internal Medicine

## 2024-02-09 DIAGNOSIS — F0280 Dementia in other diseases classified elsewhere without behavioral disturbance: Secondary | ICD-10-CM

## 2024-02-15 DIAGNOSIS — D839 Common variable immunodeficiency, unspecified: Secondary | ICD-10-CM | POA: Diagnosis not present

## 2024-02-22 DIAGNOSIS — D839 Common variable immunodeficiency, unspecified: Secondary | ICD-10-CM | POA: Diagnosis not present

## 2024-02-23 DIAGNOSIS — M1712 Unilateral primary osteoarthritis, left knee: Secondary | ICD-10-CM | POA: Diagnosis not present

## 2024-02-29 ENCOUNTER — Ambulatory Visit (INDEPENDENT_AMBULATORY_CARE_PROVIDER_SITE_OTHER): Payer: Medicare Other

## 2024-02-29 DIAGNOSIS — D839 Common variable immunodeficiency, unspecified: Secondary | ICD-10-CM | POA: Diagnosis not present

## 2024-02-29 DIAGNOSIS — I495 Sick sinus syndrome: Secondary | ICD-10-CM | POA: Diagnosis not present

## 2024-03-01 DIAGNOSIS — M1712 Unilateral primary osteoarthritis, left knee: Secondary | ICD-10-CM | POA: Diagnosis not present

## 2024-03-02 LAB — CUP PACEART REMOTE DEVICE CHECK
Battery Remaining Longevity: 133 mo
Battery Voltage: 3.02 V
Brady Statistic AP VP Percent: 0.48 %
Brady Statistic AP VS Percent: 95.42 %
Brady Statistic AS VP Percent: 0 %
Brady Statistic AS VS Percent: 4.1 %
Brady Statistic RA Percent Paced: 96.04 %
Brady Statistic RV Percent Paced: 0.48 %
Date Time Interrogation Session: 20250902185347
Implantable Lead Connection Status: 753985
Implantable Lead Connection Status: 753985
Implantable Lead Implant Date: 20130319
Implantable Lead Implant Date: 20130319
Implantable Lead Location: 753859
Implantable Lead Location: 753860
Implantable Pulse Generator Implant Date: 20230306
Lead Channel Impedance Value: 342 Ohm
Lead Channel Impedance Value: 399 Ohm
Lead Channel Impedance Value: 399 Ohm
Lead Channel Impedance Value: 437 Ohm
Lead Channel Pacing Threshold Amplitude: 0.625 V
Lead Channel Pacing Threshold Amplitude: 0.875 V
Lead Channel Pacing Threshold Pulse Width: 0.4 ms
Lead Channel Pacing Threshold Pulse Width: 0.4 ms
Lead Channel Sensing Intrinsic Amplitude: 1.25 mV
Lead Channel Sensing Intrinsic Amplitude: 1.25 mV
Lead Channel Sensing Intrinsic Amplitude: 16.375 mV
Lead Channel Sensing Intrinsic Amplitude: 16.375 mV
Lead Channel Setting Pacing Amplitude: 1.5 V
Lead Channel Setting Pacing Amplitude: 2 V
Lead Channel Setting Pacing Pulse Width: 0.4 ms
Lead Channel Setting Sensing Sensitivity: 0.9 mV
Zone Setting Status: 755011
Zone Setting Status: 755011

## 2024-03-05 ENCOUNTER — Ambulatory Visit: Payer: Self-pay | Admitting: Internal Medicine

## 2024-03-06 ENCOUNTER — Other Ambulatory Visit: Payer: Self-pay | Admitting: Cardiovascular Disease

## 2024-03-07 DIAGNOSIS — D839 Common variable immunodeficiency, unspecified: Secondary | ICD-10-CM | POA: Diagnosis not present

## 2024-03-07 NOTE — Progress Notes (Signed)
 Remote PPM Transmission

## 2024-03-08 DIAGNOSIS — M1712 Unilateral primary osteoarthritis, left knee: Secondary | ICD-10-CM | POA: Diagnosis not present

## 2024-03-14 DIAGNOSIS — D839 Common variable immunodeficiency, unspecified: Secondary | ICD-10-CM | POA: Diagnosis not present

## 2024-03-17 DIAGNOSIS — I70219 Atherosclerosis of native arteries of extremities with intermittent claudication, unspecified extremity: Secondary | ICD-10-CM | POA: Diagnosis not present

## 2024-03-17 DIAGNOSIS — I071 Rheumatic tricuspid insufficiency: Secondary | ICD-10-CM | POA: Diagnosis not present

## 2024-03-17 DIAGNOSIS — I495 Sick sinus syndrome: Secondary | ICD-10-CM | POA: Diagnosis not present

## 2024-03-17 DIAGNOSIS — R001 Bradycardia, unspecified: Secondary | ICD-10-CM | POA: Diagnosis not present

## 2024-03-17 DIAGNOSIS — Z950 Presence of cardiac pacemaker: Secondary | ICD-10-CM | POA: Diagnosis not present

## 2024-03-17 DIAGNOSIS — E785 Hyperlipidemia, unspecified: Secondary | ICD-10-CM | POA: Diagnosis not present

## 2024-03-17 DIAGNOSIS — I5032 Chronic diastolic (congestive) heart failure: Secondary | ICD-10-CM | POA: Diagnosis not present

## 2024-03-17 DIAGNOSIS — R0602 Shortness of breath: Secondary | ICD-10-CM | POA: Diagnosis not present

## 2024-03-17 DIAGNOSIS — Z79899 Other long term (current) drug therapy: Secondary | ICD-10-CM | POA: Diagnosis not present

## 2024-03-17 DIAGNOSIS — M419 Scoliosis, unspecified: Secondary | ICD-10-CM | POA: Diagnosis not present

## 2024-03-17 DIAGNOSIS — I739 Peripheral vascular disease, unspecified: Secondary | ICD-10-CM | POA: Diagnosis not present

## 2024-03-17 DIAGNOSIS — I4891 Unspecified atrial fibrillation: Secondary | ICD-10-CM | POA: Diagnosis not present

## 2024-03-17 DIAGNOSIS — I10 Essential (primary) hypertension: Secondary | ICD-10-CM | POA: Diagnosis not present

## 2024-03-17 DIAGNOSIS — I110 Hypertensive heart disease with heart failure: Secondary | ICD-10-CM | POA: Diagnosis not present

## 2024-03-17 DIAGNOSIS — I519 Heart disease, unspecified: Secondary | ICD-10-CM | POA: Diagnosis not present

## 2024-03-21 DIAGNOSIS — M6281 Muscle weakness (generalized): Secondary | ICD-10-CM | POA: Diagnosis not present

## 2024-03-21 DIAGNOSIS — R2689 Other abnormalities of gait and mobility: Secondary | ICD-10-CM | POA: Diagnosis not present

## 2024-03-21 DIAGNOSIS — D839 Common variable immunodeficiency, unspecified: Secondary | ICD-10-CM | POA: Diagnosis not present

## 2024-03-28 ENCOUNTER — Telehealth: Payer: Self-pay | Admitting: Internal Medicine

## 2024-03-28 DIAGNOSIS — I10 Essential (primary) hypertension: Secondary | ICD-10-CM

## 2024-03-28 DIAGNOSIS — R2689 Other abnormalities of gait and mobility: Secondary | ICD-10-CM | POA: Diagnosis not present

## 2024-03-28 DIAGNOSIS — M6281 Muscle weakness (generalized): Secondary | ICD-10-CM | POA: Diagnosis not present

## 2024-03-28 DIAGNOSIS — D839 Common variable immunodeficiency, unspecified: Secondary | ICD-10-CM | POA: Diagnosis not present

## 2024-03-28 NOTE — Progress Notes (Signed)
 Pharmacy Quality Measure Review  This patient is appearing on a report for being at risk of failing the adherence measure for hypertension (ACEi/ARB) medications this calendar year.   Medication: Lisinopril  10mg  Last fill date: 08/05 for 30 day supply No RF left on current script.  This patient is appearing on a report for being at risk of failing the adherence measure for diabetes medications this calendar year.   Medication: Jardiance  10mg  Last fill date: 08/09 for 30 day supply Renewal script written 03/06/24 90DS 2 RF.  Reached patient, she is no longer taking either per instruction from [facility] cardiologist. States she is only taking amlodipine  and is doing well. Noted this in chart. No further intervention needed.  Angela Baalmann, PharmD [facility] Pharmacist

## 2024-03-30 DIAGNOSIS — R2689 Other abnormalities of gait and mobility: Secondary | ICD-10-CM | POA: Diagnosis not present

## 2024-03-30 DIAGNOSIS — M6281 Muscle weakness (generalized): Secondary | ICD-10-CM | POA: Diagnosis not present

## 2024-04-04 DIAGNOSIS — R2689 Other abnormalities of gait and mobility: Secondary | ICD-10-CM | POA: Diagnosis not present

## 2024-04-04 DIAGNOSIS — D839 Common variable immunodeficiency, unspecified: Secondary | ICD-10-CM | POA: Diagnosis not present

## 2024-04-04 DIAGNOSIS — M6281 Muscle weakness (generalized): Secondary | ICD-10-CM | POA: Diagnosis not present

## 2024-04-06 DIAGNOSIS — M6281 Muscle weakness (generalized): Secondary | ICD-10-CM | POA: Diagnosis not present

## 2024-04-06 DIAGNOSIS — R2689 Other abnormalities of gait and mobility: Secondary | ICD-10-CM | POA: Diagnosis not present

## 2024-04-10 DIAGNOSIS — R059 Cough, unspecified: Secondary | ICD-10-CM | POA: Diagnosis not present

## 2024-04-10 DIAGNOSIS — R0981 Nasal congestion: Secondary | ICD-10-CM | POA: Diagnosis not present

## 2024-04-11 DIAGNOSIS — D839 Common variable immunodeficiency, unspecified: Secondary | ICD-10-CM | POA: Diagnosis not present

## 2024-04-18 DIAGNOSIS — D839 Common variable immunodeficiency, unspecified: Secondary | ICD-10-CM | POA: Diagnosis not present

## 2024-04-18 DIAGNOSIS — R2689 Other abnormalities of gait and mobility: Secondary | ICD-10-CM | POA: Diagnosis not present

## 2024-04-18 DIAGNOSIS — M6281 Muscle weakness (generalized): Secondary | ICD-10-CM | POA: Diagnosis not present

## 2024-04-24 DIAGNOSIS — D839 Common variable immunodeficiency, unspecified: Secondary | ICD-10-CM | POA: Diagnosis not present

## 2024-04-25 DIAGNOSIS — M6281 Muscle weakness (generalized): Secondary | ICD-10-CM | POA: Diagnosis not present

## 2024-04-25 DIAGNOSIS — R2689 Other abnormalities of gait and mobility: Secondary | ICD-10-CM | POA: Diagnosis not present

## 2024-05-12 LAB — HEPATIC FUNCTION PANEL
ALT: 21 U/L (ref 7–35)
AST: 31 (ref 13–35)
Alkaline Phosphatase: 75 (ref 25–125)
Bilirubin, Total: 0.4

## 2024-05-12 LAB — BASIC METABOLIC PANEL WITH GFR
BUN: 18 (ref 4–21)
CO2: 26 — AB (ref 13–22)
Chloride: 101 (ref 99–108)
Creatinine: 0.6 (ref 0.5–1.1)
Glucose: 91
Potassium: 4.7 meq/L (ref 3.5–5.1)
Sodium: 137 (ref 137–147)

## 2024-05-12 LAB — COMPREHENSIVE METABOLIC PANEL WITH GFR
Albumin: 4.6 (ref 3.5–5.0)
Calcium: 10 (ref 8.7–10.7)

## 2024-05-30 ENCOUNTER — Ambulatory Visit: Payer: Medicare Other

## 2024-05-30 DIAGNOSIS — I495 Sick sinus syndrome: Secondary | ICD-10-CM

## 2024-05-31 ENCOUNTER — Other Ambulatory Visit: Payer: Self-pay | Admitting: Internal Medicine

## 2024-05-31 DIAGNOSIS — G6289 Other specified polyneuropathies: Secondary | ICD-10-CM

## 2024-05-31 LAB — CUP PACEART REMOTE DEVICE CHECK
Battery Remaining Longevity: 130 mo
Battery Voltage: 3.02 V
Brady Statistic AP VP Percent: 0.15 %
Brady Statistic AP VS Percent: 94.3 %
Brady Statistic AS VP Percent: 0 %
Brady Statistic AS VS Percent: 5.55 %
Brady Statistic RA Percent Paced: 94.52 %
Brady Statistic RV Percent Paced: 0.15 %
Date Time Interrogation Session: 20251202010809
Implantable Lead Connection Status: 753985
Implantable Lead Connection Status: 753985
Implantable Lead Implant Date: 20130319
Implantable Lead Implant Date: 20130319
Implantable Lead Location: 753859
Implantable Lead Location: 753860
Implantable Pulse Generator Implant Date: 20230306
Lead Channel Impedance Value: 342 Ohm
Lead Channel Impedance Value: 399 Ohm
Lead Channel Impedance Value: 418 Ohm
Lead Channel Impedance Value: 456 Ohm
Lead Channel Pacing Threshold Amplitude: 0.5 V
Lead Channel Pacing Threshold Amplitude: 0.75 V
Lead Channel Pacing Threshold Pulse Width: 0.4 ms
Lead Channel Pacing Threshold Pulse Width: 0.4 ms
Lead Channel Sensing Intrinsic Amplitude: 1 mV
Lead Channel Sensing Intrinsic Amplitude: 1 mV
Lead Channel Sensing Intrinsic Amplitude: 19.25 mV
Lead Channel Sensing Intrinsic Amplitude: 19.25 mV
Lead Channel Setting Pacing Amplitude: 1.5 V
Lead Channel Setting Pacing Amplitude: 2 V
Lead Channel Setting Pacing Pulse Width: 0.4 ms
Lead Channel Setting Sensing Sensitivity: 0.9 mV
Zone Setting Status: 755011
Zone Setting Status: 755011

## 2024-06-02 ENCOUNTER — Ambulatory Visit: Payer: Self-pay | Admitting: Internal Medicine

## 2024-06-02 NOTE — Progress Notes (Signed)
 Remote PPM Transmission

## 2024-07-04 ENCOUNTER — Ambulatory Visit: Payer: Self-pay | Admitting: Internal Medicine

## 2024-07-04 ENCOUNTER — Ambulatory Visit (INDEPENDENT_AMBULATORY_CARE_PROVIDER_SITE_OTHER): Admitting: Internal Medicine

## 2024-07-04 ENCOUNTER — Ambulatory Visit (INDEPENDENT_AMBULATORY_CARE_PROVIDER_SITE_OTHER)

## 2024-07-04 ENCOUNTER — Encounter: Payer: Self-pay | Admitting: Internal Medicine

## 2024-07-04 VITALS — BP 144/86 | HR 83 | Temp 99.1°F | Resp 16 | Ht 62.0 in | Wt 129.8 lb

## 2024-07-04 DIAGNOSIS — I10 Essential (primary) hypertension: Secondary | ICD-10-CM

## 2024-07-04 DIAGNOSIS — E039 Hypothyroidism, unspecified: Secondary | ICD-10-CM

## 2024-07-04 DIAGNOSIS — R052 Subacute cough: Secondary | ICD-10-CM | POA: Insufficient documentation

## 2024-07-04 DIAGNOSIS — C8221 Follicular lymphoma grade III, unspecified, lymph nodes of head, face, and neck: Secondary | ICD-10-CM | POA: Diagnosis not present

## 2024-07-04 DIAGNOSIS — Z23 Encounter for immunization: Secondary | ICD-10-CM

## 2024-07-04 LAB — CBC WITH DIFFERENTIAL/PLATELET
Basophils Absolute: 0.1 K/uL (ref 0.0–0.1)
Basophils Relative: 1.1 % (ref 0.0–3.0)
Eosinophils Absolute: 0.3 K/uL (ref 0.0–0.7)
Eosinophils Relative: 3.9 % (ref 0.0–5.0)
HCT: 42.9 % (ref 36.0–46.0)
Hemoglobin: 14.2 g/dL (ref 12.0–15.0)
Lymphocytes Relative: 28.1 % (ref 12.0–46.0)
Lymphs Abs: 2.3 K/uL (ref 0.7–4.0)
MCHC: 33.1 g/dL (ref 30.0–36.0)
MCV: 96.2 fl (ref 78.0–100.0)
Monocytes Absolute: 0.6 K/uL (ref 0.1–1.0)
Monocytes Relative: 7.4 % (ref 3.0–12.0)
Neutro Abs: 4.9 K/uL (ref 1.4–7.7)
Neutrophils Relative %: 59.5 % (ref 43.0–77.0)
Platelets: 267 K/uL (ref 150.0–400.0)
RBC: 4.46 Mil/uL (ref 3.87–5.11)
RDW: 13.6 % (ref 11.5–15.5)
WBC: 8.3 K/uL (ref 4.0–10.5)

## 2024-07-04 LAB — TSH: TSH: 0.62 u[IU]/mL (ref 0.35–5.50)

## 2024-07-04 MED ORDER — UNITHROID 100 MCG PO TABS: 100.0000 ug | ORAL_TABLET | Freq: Every day | ORAL | 1 refills | Status: AC

## 2024-07-04 MED ORDER — BOOSTRIX 5-2.5-18.5 LF-MCG/0.5 IM SUSP: 0.5000 mL | Freq: Once | INTRAMUSCULAR | 0 refills | Status: AC

## 2024-07-04 NOTE — Patient Instructions (Signed)

## 2024-07-04 NOTE — Progress Notes (Signed)
 "  Subjective:  Patient ID: [name], female    DOB: Nov 08, 1934  Age: 89 y.o. MRN: [id number]  CC: Medical Management of Chronic Issues (6 month follow up and medication refill. )   HPI [name] presents for f/up ---    Discussed the use of AI scribe software for clinical note transcription with the patient, who gave verbal consent to proceed.  History of Present Illness An 89 year old female presents with persistent cough and shortness of breath.  She has been experiencing symptoms since before Christmas, initially suspecting the flu. She visited urgent care and was told she had a 'bronchial infarction'. Despite treatment, she continues to have a persistent cough with thick, yellow phlegm that is difficult to expel. She reports she is not coughing up blood.  She is currently taking doxycycline  for her symptoms and reports tolerating it well. Prior to this, she was on amoxicillin for dental work. She also uses a cough syrup, which she finds helpful, though it affects her sleep quality.  She experiences shortness of breath but denies any chest pain, fever, chills, or night sweats. She has not had a flu shot and reports she is on Hizentra , which she believes affects her immune system. She has received a COVID vaccine and her last pneumonia vaccine was three years ago.  She feels irritable and nervous, attributing it to being sick. No stomach issues are reported and her sore throat has cleared up.     Outpatient Medications Prior to Visit  Medication Sig Dispense Refill   acetaminophen  (TYLENOL ) 500 MG tablet Take 500 mg by mouth every 6 (six) hours as needed (for pain.).      amLODipine  (NORVASC ) 2.5 MG tablet Take 1 tablet (2.5 mg total) by mouth daily. 180 tablet 3   Calcium  Carbonate-Vitamin D  600-400 MG-UNIT tablet Take 1 tablet by mouth daily.     doxycycline  (VIBRAMYCIN ) 100 MG capsule Take 100 mg by mouth 2 (two) times daily.     EPINEPHrine  0.3 mg/0.3 mL IJ SOAJ  injection Inject 0.3 mg into the muscle as needed for anaphylaxis.     ferrous fumarate  (HEMOCYTE - 106 MG FE) 325 (106 Fe) MG TABS tablet Take 1 tablet by mouth daily.     gabapentin  (NEURONTIN ) 300 MG capsule TAKE 1 CAPSULE(300 MG) BY MOUTH FOUR TIMES DAILY 360 capsule 1   Immune Globulin , Human, (HIZENTRA ) 4 GM/20ML SOLN Inject 8 g into the skin once a week. Thursday's     Magnesium  Oxide (MAG-OX 400 PO) Take 400 mg by mouth 2 (two) times a week.     metoprolol  tartrate (LOPRESSOR ) 50 MG tablet TAKE 1 TABLET(50 MG) BY MOUTH TWICE DAILY 60 tablet 0   potassium chloride  (KLOR-CON ) 10 MEQ tablet TAKE 1 TABLET(10 MEQ) BY MOUTH DAILY (Patient taking differently: Take 10 mEq by mouth daily.) 90 tablet 0   promethazine -dextromethorphan  (PROMETHAZINE -DM) 6.25-15 MG/5ML syrup Take 5 mLs by mouth every 4 (four) hours as needed.     thiamine  (VITAMIN B1) 100 MG tablet TAKE 1 TABLET(100 MG) BY MOUTH DAILY 90 tablet 0   UNITHROID  100 MCG tablet Take 1 tablet (100 mcg total) by mouth daily before breakfast. 90 tablet 1   empagliflozin  (JARDIANCE ) 10 MG TABS tablet TAKE 1 TABLET(10 MG) BY MOUTH DAILY (Patient not taking: Reported on 07/04/2024) 90 tablet 2   lisinopril  (ZESTRIL ) 10 MG tablet TAKE 1 TABLET(10 MG) BY MOUTH DAILY (Patient not taking: Reported on 07/04/2024) 30 tablet 0   No  facility-administered medications prior to visit.    ROS Review of Systems  Constitutional:  Negative for appetite change, chills, diaphoresis, fatigue and fever.  HENT: Negative.  Negative for sore throat and trouble swallowing.   Eyes: Negative.   Respiratory:  Positive for cough. Negative for chest tightness, shortness of breath and wheezing.   Cardiovascular:  Negative for chest pain, palpitations and leg swelling.  Gastrointestinal: Negative.  Negative for abdominal pain, constipation, diarrhea, nausea and vomiting.  Endocrine: Negative.  Negative for cold intolerance and heat intolerance.  Genitourinary: Negative.   Negative for difficulty urinating and dysuria.  Musculoskeletal:  Positive for back pain. Negative for myalgias.  Skin: Negative.   Neurological: Negative.  Negative for dizziness, weakness, light-headedness, numbness and headaches.  Hematological:  Negative for adenopathy. Does not bruise/bleed easily.  Psychiatric/Behavioral:  Positive for dysphoric mood.     Objective:  BP (!) 144/86 (BP Location: Left Arm, Patient Position: Sitting, Cuff Size: Normal)   Pulse 83   Temp 99.1 F (37.3 C) (Temporal)   Resp 16   Ht 5' 2 (1.575 m)   Wt 129 lb 12.8 oz (58.9 kg)   SpO2 96%   BMI 23.74 kg/m   BP Readings from Last 3 Encounters:  07/04/24 (!) 144/86  11/24/23 132/78  11/18/23 130/72    Wt Readings from Last 3 Encounters:  07/04/24 129 lb 12.8 oz (58.9 kg)  11/24/23 129 lb (58.5 kg)  11/23/23 127 lb (57.6 kg)    Physical Exam Vitals reviewed.  Constitutional:      General: She is not in acute distress.    Appearance: She is not ill-appearing, toxic-appearing or diaphoretic.  HENT:     Mouth/Throat:     Mouth: Mucous membranes are moist.  Eyes:     General: No scleral icterus.    Conjunctiva/sclera: Conjunctivae normal.  Cardiovascular:     Rate and Rhythm: Normal rate and regular rhythm.     Pulses: Normal pulses.     Heart sounds: No murmur heard.    No friction rub. No gallop.  Pulmonary:     Effort: Pulmonary effort is normal.     Breath sounds: No stridor. No wheezing, rhonchi or rales.  Abdominal:     General: Abdomen is flat. Bowel sounds are normal. There is no distension.     Palpations: Abdomen is soft. There is no hepatomegaly, splenomegaly or mass.     Tenderness: There is no abdominal tenderness.  Musculoskeletal:        General: Normal range of motion.     Cervical back: Neck supple.     Thoracic back: Scoliosis present.     Right lower leg: No edema.     Left lower leg: No edema.  Lymphadenopathy:     Cervical: No cervical adenopathy.  Skin:     General: Skin is warm and dry.  Neurological:     Mental Status: She is alert. Mental status is at baseline.  Psychiatric:        Mood and Affect: Mood normal.        Behavior: Behavior normal.     Lab Results  Component Value Date   WBC 8.3 07/04/2024   HGB 14.2 07/04/2024   HCT 42.9 07/04/2024   PLT 267.0 07/04/2024   GLUCOSE 80 08/06/2023   CHOL 214 (H) 11/24/2023   TRIG 221.0 (H) 11/24/2023   HDL 56.10 11/24/2023   LDLDIRECT 125.0 10/27/2022   LDLCALC 114 (H) 11/24/2023   ALT 21 05/12/2024  AST 31 05/12/2024   NA 137 05/12/2024   K 4.7 05/12/2024   CL 101 05/12/2024   CREATININE 0.6 05/12/2024   BUN 18 05/12/2024   CO2 26 (A) 05/12/2024   TSH 0.62 07/04/2024    CT CHEST ABDOMEN PELVIS W CONTRAST Result Date: 08/09/2023 CLINICAL DATA:  Follicular lymphoma restaging * Tracking Code: BO * EXAM: CT CHEST, ABDOMEN, AND PELVIS WITH CONTRAST TECHNIQUE: Multidetector CT imaging of the chest, abdomen and pelvis was performed following the standard protocol during bolus administration of intravenous contrast. RADIATION DOSE REDUCTION: This exam was performed according to the departmental dose-optimization program which includes automated exposure control, adjustment of the mA and/or kV according to patient size and/or use of iterative reconstruction technique. CONTRAST:  OMNIPAQUE  IOHEXOL  300 MG/ML SOLN additional oral enteric contrast COMPARISON:  PET-CT, 06/01/2023 FINDINGS: CT CHEST FINDINGS Cardiovascular: Left chest multilead pacer. Cardiomegaly. Scattered left coronary artery calcifications. Enlargement of the main pulmonary artery measuring up to 3.8 cm. No pericardial effusion. Mediastinum/Nodes: No enlarged mediastinal, hilar, or axillary lymph nodes. Large paraesophageal hernia containing the stomach and splenic flexure of the colon (series 2, image 38). Thyroid  gland, trachea, and esophagus demonstrate no significant findings. Lungs/Pleura: Unchanged nodular consolidation  of the medial right lung base measuring 1.7 x 1.7 cm on today's examination, not previously PET avid and consistent with chronic scarring or atelectasis (series 4, image 107). Additional compressive atelectasis of the left lower lobe secondary to large paraesophageal hernia. Unchanged small pulmonary nodules, largest a subpleural nodule of the anterior right upper lobe measuring 0.5 cm (series 4, image 67). No pleural effusion or pneumothorax. Musculoskeletal: No chest wall abnormality. No acute osseous findings. CT ABDOMEN PELVIS FINDINGS Hepatobiliary: No solid liver abnormality is seen. No gallstones, gallbladder wall thickening, or biliary dilatation. Pancreas: Unremarkable. No pancreatic ductal dilatation or surrounding inflammatory changes. Spleen: Normal in size without significant abnormality. Adrenals/Urinary Tract: Adrenal glands are unremarkable. Kidneys are normal, without renal calculi, solid lesion, or hydronephrosis. Bladder is unremarkable. Stomach/Bowel: Stomach is within normal limits. Appendix appears normal. No evidence of bowel wall thickening, distention, or inflammatory changes. Descending and sigmoid diverticulosis. Vascular/Lymphatic: Aortic atherosclerosis. No enlarged abdominal or pelvic lymph nodes. Reproductive: Status post hysterectomy. Other: No abdominal wall hernia or abnormality. No ascites. Musculoskeletal: No acute osseous findings. Severe levoscoliosis of the thoracolumbar spine associated disc degenerative disease Please note that definitively benign incidental findings, functional findings, and anatomic variants may have been intentionally omitted from this report in the interest of brevity and clarity. If not specifically noted and recommended in the impression, no further follow-up or characterization is required for incidental findings. IMPRESSION: 1. No enlarged lymph nodes of the chest, abdomen, or pelvis. 2. Unchanged nodular consolidation of the medial right lung base,  not previously PET avid and consistent with chronic scarring or atelectasis. 3. Unchanged small pulmonary nodules, presumed benign. 4. Large paraesophageal hernia containing the stomach and splenic flexure of the colon. 5. Cardiomegaly and coronary artery disease. 6. Enlargement of the main pulmonary artery, as can be seen in pulmonary hypertension. Aortic Atherosclerosis (ICD10-I70.0). Electronically Signed   By: Marolyn JONETTA Jaksch M.D.   On: 08/09/2023 21:58    DG Chest 2 View Result Date: 07/04/2024 CLINICAL DATA:  Cough, shortness of breath, and chest pain for several weeks. EXAM: CHEST - 2 VIEW COMPARISON:  11/24/2023 FINDINGS: Stable mild cardiomegaly. Dual lead pacemaker remains in expected position. Moderate to large left diaphragmatic hernia is unchanged. Both lungs are clear. Thoracolumbar levoscoliosis again noted. IMPRESSION: No  active cardiopulmonary disease. Stable moderate to large left diaphragmatic hernia. Electronically Signed   By: Norleen DELENA Kil M.D.   On: 07/04/2024 12:05     Assessment & Plan:   Grade 3 follicular lymphoma of lymph nodes of neck (HCC)- NED. -     CBC with Differential/Platelet; Future -     DG Chest 2 View; Future  Essential hypertension- BP is adequately well controlled. -     CBC with Differential/Platelet; Future -     TSH; Future  Need for prophylactic vaccination with combined diphtheria-tetanus-pertussis (DTP) vaccine -     Boostrix ; Inject 0.5 mLs into the muscle once for 1 dose.  Dispense: 0.5 mL; Refill: 0  Subacute cough -     DG Chest 2 View; Future  Hypothyroidism, unspecified type- She is euthyroid. -     Unithroid ; Take 1 tablet (100 mcg total) by mouth daily before breakfast.  Dispense: 90 tablet; Refill: 1     Follow-up: Return in about 3 months (around 10/02/2024).  Debby Molt, MD "

## 2024-07-05 ENCOUNTER — Telehealth: Payer: Self-pay

## 2024-07-05 NOTE — Telephone Encounter (Signed)
 Copied from CRM [id number]. Topic: Clinical - Lab/Test Results >> Jul 05, 2024 12:18 PM Robinson H wrote: Reason for CRM: Patient returning call to [facility] regarding results, no message okay to relay message to patient.   [name] [phone]

## 2024-07-05 NOTE — Telephone Encounter (Signed)
 Results has been relayed to the patient and she gave a verbal understanding.

## 2024-07-28 ENCOUNTER — Telehealth: Payer: Self-pay

## 2024-07-28 NOTE — Telephone Encounter (Signed)
 Called and left her a message. Can we bring in for an office visit to discuss anticoagulation, please?

## 2024-07-28 NOTE — Telephone Encounter (Signed)
 Spoke with patients daughter regarding upcoming CT scan scheduled for 08/08/24. 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 She stated the patient is being seen at [facility] on Wednesday, 08/02/24, where biopsy results will be reviewed to determine the specific diagnosis.  Informed daughter that since the patient is currently under [facility] care for this cancer, staging scans will be managed through their facility. Advised daughter to call our office following the appointment with [facility]. Also discussed that if the patient has labs drawn at that appointment, those labs may be used for the CT scan at our facility, provided a copy of the lab results is received beforehand so they can be scanned into the patients chart.  Daughter voiced understanding and stated she will call later in the week with an update.

## 2024-07-28 NOTE — Telephone Encounter (Signed)
 MDT, PPM ALERT: AF Alert remote transmission:  AT/AF Daily Burden > Threshold AF in progress from 1/29, not always good rate control, NSVT events with irregular R-R  1/30 - UPDATED TRANSMISSION:  Shows 1/29 AF event lasted 13 hours, with poor rate control during AF events.   LM to discuss with patient.  Note: hx of very short AF episodes years ago before device implanted, none since.  Dr. Francyne patient.

## 2024-07-31 NOTE — Telephone Encounter (Signed)
 Patient called to advise Dr. Francyne would like to see her in office to discuss OAC. Patient agreeable to plan. Routing to EP scheduler.

## 2024-08-04 ENCOUNTER — Telehealth: Payer: Self-pay | Admitting: Cardiovascular Disease

## 2024-08-04 NOTE — Telephone Encounter (Signed)
 Spoke with patient.  She has been having intermittent dizziness and lightheadedness.  Also want to make us  aware that she has surgery scheduled for 2/24 and would like this addressed before then.   Dr. Francyne is out of the office until the 16th.   Setting patient up with AF clinic for Tuesday, 2/10, 3pm with Jackee Alberts, NP.  Patient is aware. Sent email with appt information.

## 2024-08-04 NOTE — Telephone Encounter (Signed)
" °  1. Has your device fired? No  2. Is you device beeping? No  3. Are you experiencing draining or swelling at device site? No  4. Are you calling to see if we received your device transmission? No  5. Have you passed out? No   Patient is calling reporting that her heart device is notifying her of irregular heart Rhythm.   Please route to Device Clinic Pool  "

## 2024-08-04 NOTE — Telephone Encounter (Signed)
 Spoke with patient.  She is calling back in regards to a prior phone message re: her AF/RVR and sx events.  Dr. Francyne wanting to start her on OAC.   Patient also has Surgery coming up on 2/24 and is wanting to address this with him before then.   See separate phone encounter with outcome.

## 2024-08-08 ENCOUNTER — Inpatient Hospital Stay

## 2024-08-08 ENCOUNTER — Ambulatory Visit (HOSPITAL_COMMUNITY): Admitting: Nurse Practitioner

## 2024-08-08 ENCOUNTER — Ambulatory Visit (HOSPITAL_COMMUNITY)

## 2024-11-27 ENCOUNTER — Ambulatory Visit
